# Patient Record
Sex: Female | Born: 1950 | Race: Black or African American | Hispanic: No | Marital: Married | Smoking: Never smoker
Health system: Southern US, Community
[De-identification: ages and names within clinical notes are randomized; demographics above are authoritative.]

## PROBLEM LIST (undated history)

## (undated) DIAGNOSIS — K219 Gastro-esophageal reflux disease without esophagitis: Secondary | ICD-10-CM

## (undated) DIAGNOSIS — M1990 Unspecified osteoarthritis, unspecified site: Secondary | ICD-10-CM

## (undated) DIAGNOSIS — E119 Type 2 diabetes mellitus without complications: Secondary | ICD-10-CM

## (undated) DIAGNOSIS — M542 Cervicalgia: Secondary | ICD-10-CM

## (undated) DIAGNOSIS — I509 Heart failure, unspecified: Secondary | ICD-10-CM

## (undated) DIAGNOSIS — G43909 Migraine, unspecified, not intractable, without status migrainosus: Secondary | ICD-10-CM

## (undated) DIAGNOSIS — G8929 Other chronic pain: Secondary | ICD-10-CM

## (undated) DIAGNOSIS — J302 Other seasonal allergic rhinitis: Secondary | ICD-10-CM

## (undated) DIAGNOSIS — N183 Chronic kidney disease, stage 3 unspecified: Secondary | ICD-10-CM

## (undated) DIAGNOSIS — T148XXA Other injury of unspecified body region, initial encounter: Secondary | ICD-10-CM

## (undated) DIAGNOSIS — I10 Essential (primary) hypertension: Secondary | ICD-10-CM

## (undated) DIAGNOSIS — Z8614 Personal history of Methicillin resistant Staphylococcus aureus infection: Secondary | ICD-10-CM

## (undated) DIAGNOSIS — J189 Pneumonia, unspecified organism: Secondary | ICD-10-CM

## (undated) DIAGNOSIS — J45909 Unspecified asthma, uncomplicated: Secondary | ICD-10-CM

## (undated) DIAGNOSIS — D649 Anemia, unspecified: Secondary | ICD-10-CM

## (undated) DIAGNOSIS — I499 Cardiac arrhythmia, unspecified: Secondary | ICD-10-CM

## (undated) DIAGNOSIS — F419 Anxiety disorder, unspecified: Secondary | ICD-10-CM

## (undated) DIAGNOSIS — E785 Hyperlipidemia, unspecified: Secondary | ICD-10-CM

## (undated) DIAGNOSIS — R002 Palpitations: Secondary | ICD-10-CM

## (undated) DIAGNOSIS — I2721 Secondary pulmonary arterial hypertension: Secondary | ICD-10-CM

## (undated) DIAGNOSIS — Z9289 Personal history of other medical treatment: Secondary | ICD-10-CM

## (undated) DIAGNOSIS — F32A Depression, unspecified: Secondary | ICD-10-CM

## (undated) DIAGNOSIS — M549 Dorsalgia, unspecified: Secondary | ICD-10-CM

## (undated) HISTORY — PX: COLONOSCOPY: SHX174

## (undated) HISTORY — PX: KNEE ARTHROSCOPY: SHX127

## (undated) HISTORY — PX: HAND SURGERY: SHX662

## (undated) HISTORY — DX: Essential (primary) hypertension: I10

## (undated) HISTORY — DX: Personal history of Methicillin resistant Staphylococcus aureus infection: Z86.14

## (undated) HISTORY — DX: Unspecified osteoarthritis, unspecified site: M19.90

## (undated) HISTORY — DX: Anemia, unspecified: D64.9

## (undated) HISTORY — DX: Hyperlipidemia, unspecified: E78.5

## (undated) HISTORY — PX: DILATION AND CURETTAGE OF UTERUS: SHX78

## (undated) HISTORY — PX: POLYPECTOMY: SHX149

## (undated) HISTORY — PX: COLONOSCOPY W/ POLYPECTOMY: SHX1380

## (undated) HISTORY — DX: Unspecified asthma, uncomplicated: J45.909

## (undated) HISTORY — DX: Palpitations: R00.2

## (undated) HISTORY — DX: Dorsalgia, unspecified: M54.9

## (undated) HISTORY — PX: NASAL SINUS SURGERY: SHX719

## (undated) HISTORY — PX: JOINT REPLACEMENT: SHX530

## (undated) HISTORY — PX: ESOPHAGOGASTRODUODENOSCOPY: SHX1529

## (undated) HISTORY — PX: BACK SURGERY: SHX140

## (undated) HISTORY — PX: UPPER GASTROINTESTINAL ENDOSCOPY: SHX188

---

## 1998-01-21 ENCOUNTER — Encounter: Admission: RE | Admit: 1998-01-21 | Discharge: 1998-04-21 | Payer: Self-pay | Admitting: Endocrinology

## 2001-03-21 ENCOUNTER — Other Ambulatory Visit: Admission: RE | Admit: 2001-03-21 | Discharge: 2001-03-21 | Payer: Self-pay | Admitting: *Deleted

## 2001-03-28 HISTORY — PX: ABDOMINAL HYSTERECTOMY: SHX81

## 2001-04-19 ENCOUNTER — Encounter (INDEPENDENT_AMBULATORY_CARE_PROVIDER_SITE_OTHER): Payer: Self-pay | Admitting: Specialist

## 2001-04-20 ENCOUNTER — Inpatient Hospital Stay (HOSPITAL_COMMUNITY): Admission: EM | Admit: 2001-04-20 | Discharge: 2001-04-21 | Payer: Self-pay | Admitting: *Deleted

## 2005-10-30 ENCOUNTER — Encounter: Admission: RE | Admit: 2005-10-30 | Discharge: 2005-10-30 | Payer: Self-pay | Admitting: Orthopaedic Surgery

## 2005-12-06 ENCOUNTER — Encounter: Admission: RE | Admit: 2005-12-06 | Discharge: 2005-12-06 | Payer: Self-pay | Admitting: Orthopaedic Surgery

## 2005-12-27 ENCOUNTER — Encounter: Admission: RE | Admit: 2005-12-27 | Discharge: 2005-12-27 | Payer: Self-pay | Admitting: Orthopaedic Surgery

## 2006-01-17 ENCOUNTER — Encounter: Admission: RE | Admit: 2006-01-17 | Discharge: 2006-01-17 | Payer: Self-pay | Admitting: Orthopaedic Surgery

## 2006-05-17 ENCOUNTER — Ambulatory Visit: Payer: Self-pay

## 2006-12-21 ENCOUNTER — Encounter: Admission: RE | Admit: 2006-12-21 | Discharge: 2006-12-21 | Payer: Self-pay | Admitting: Neurological Surgery

## 2008-04-02 ENCOUNTER — Encounter: Admission: RE | Admit: 2008-04-02 | Discharge: 2008-04-02 | Payer: Self-pay | Admitting: Cardiology

## 2010-05-08 ENCOUNTER — Ambulatory Visit: Payer: Self-pay | Admitting: Cardiology

## 2010-05-11 ENCOUNTER — Encounter: Admission: RE | Admit: 2010-05-11 | Discharge: 2010-05-11 | Payer: Self-pay | Admitting: Endocrinology

## 2010-06-28 HISTORY — PX: CATARACT EXTRACTION W/ INTRAOCULAR LENS  IMPLANT, BILATERAL: SHX1307

## 2010-09-07 ENCOUNTER — Ambulatory Visit (INDEPENDENT_AMBULATORY_CARE_PROVIDER_SITE_OTHER): Payer: Medicare Other | Admitting: Cardiology

## 2010-09-07 DIAGNOSIS — E789 Disorder of lipoprotein metabolism, unspecified: Secondary | ICD-10-CM

## 2010-09-07 DIAGNOSIS — I119 Hypertensive heart disease without heart failure: Secondary | ICD-10-CM

## 2010-09-07 DIAGNOSIS — E119 Type 2 diabetes mellitus without complications: Secondary | ICD-10-CM

## 2010-11-13 NOTE — Op Note (Signed)
[facility]  Patient:    Belinda [name], Belinda [name] Visit Number: [id number] MRN: [id number]          Service Type: SUR Location: 4W [id number] Attending Physician:  [name] Proc. Date: 04/19/01 Admit Date:  04/19/2001   CC:         Belinda [name], M.D.                           Operative Report  PREOPERATIVE DIAGNOSES: 1. Leiomyomata uteri. 2. Abnormal uterine bleeding. 3. Anemia secondary to above.  POSTOPERATIVE DIAGNOSES: 1. Leiomyomata uteri. 2. Abnormal uterine bleeding. 3. Anemia secondary to above. 4. Left ovarian cyst.  OPERATION:  Abdominal supracervical hysterectomy, left salpingo-oophorectomy.  ANESTHESIA:  General orotracheal.  OPERATOR:  [name]. [name], M.D.  FIRST ASSISTANT:  Belinda [name], M.D.  INDICATION FOR SURGERY:  The patient is a 60 year old with markedly enlarged uterus and abnormal bleeding for hysterectomy and indicated procedures.  She is fully counseled as to the nature of the procedure and the risks involved to include risks of anesthesia; injury to bowel, bladder, blood vessels, ureters, postoperative hemorrhage, infection, recuperation, and possible use of hormone replacement should her ovaries be removed.  She fully understands all of these considerations and wishes to proceed on April 19, 2001.  OPERATIVE FINDINGS:  On entry into the abdomen, the uterus was approximately [redacted] weeks gestational size.  The left ovary had a large cyst approximately 5 cm in greatest dimensions which appeared to be simple.  The right ovary and tube appeared to be normal.  Exploration of the upper abdomen revealed all structures to be normal, to include the appendix.  DESCRIPTION OF PROCEDURE:  With the patient under general anesthesia, prepared and draped in the usual sterile fashion, with a Foley catheter in the bladder, a lower abdominal vertical incision was made through a previous surgical scar. Entry into the  peritoneum was accomplished without difficulty.  The uterine fundus was elevated out of the incision using traction.  Heaney clamps were used to clamp the uteroovarian anastomoses, tubes, and round ligaments bilaterally; these structures were cut free and doubly ligated with 1 chromic catgut.  The left ovarian cyst was excised initially.  Heaney clamps were used to clamp the round ligaments bilaterally which were then transected and suture ligated with 1 chromic catgut.  Bladder flap was developed on the anterior surface of the uterus; the bladder had been advanced high on the uterine fundus after a previous C-section.  The bladder was taken down over the lower uterine segment and cervix, and Heaney clamps were used after the vessels were skeletonized to clamp these structures which were then cut and suture ligated with 1 chromic catgut.  Heaney clamps were used to clamp the remaining portions of the uterine vessels and cardinal ligaments bilaterally; these structures were sequentially cut free and individually suture ligated with 1 chromic catgut.  It was then possible to excise the uterine fundus using Bovie electrocoagulation to remove the fundus from the cervix.  Small bleeders on the cervix were rendered hemostatic with Bovie electrocoagulation. Interrupted figure-of-eight sutures of 1 chromic catgut were used to reapproximate the edges of the cervix and for hemostasis.  Small bleeders were rendered hemostatic with Bovie electrocoagulation.  At this point, it was noted that there was a large amount of bleeding from the left infundibulopelvic ligament distal to the ovary so that the ovary could not be conserved.  Accordingly, a  Heaney clamp was placed across this ligament, and the left tube and ovary were excised; the infundibulopelvic ligament was rendered hemostatic with two ties of 1 chromic catgut.  The area was lavaged with copious amounts of Lactated Ringers solution, and after  noting that hemostasis was maintained, the right ovary was suspended on the right lateral peritoneal surface with interrupted suture of 1 chromic catgut.  Peritoneum was then closed after noting that hemostasis was maintained and that sponge and instruments counts were correct with a continuous suture of 0 Vicryl.  The fascia was closed with two sutures of 0 PDS which were brought from the upper and lower aspects of the incision and tied separately in the midline. Subcutaneous fat was reapproximated with interrupted sutures of 1 chromic catgut.  Skin was closed with a subcuticular suture of 3-0 plain catgut. Estimated blood loss 250 mL.  The patient was taken to the recovery room in good condition with clear urine and a Foley catheter tubing.  She will be placed on a 23-hour observation following surgery. Attending Physician:  [name] DD:  04/19/01 TD:  04/20/01 Job: [id number]

## 2010-12-04 ENCOUNTER — Other Ambulatory Visit: Payer: Self-pay | Admitting: Cardiology

## 2010-12-07 NOTE — Telephone Encounter (Signed)
Med refill

## 2011-03-04 ENCOUNTER — Other Ambulatory Visit: Payer: Self-pay | Admitting: Cardiology

## 2011-03-04 DIAGNOSIS — I10 Essential (primary) hypertension: Secondary | ICD-10-CM

## 2011-03-05 NOTE — Telephone Encounter (Signed)
Refilled microzide

## 2011-03-15 ENCOUNTER — Encounter: Payer: Self-pay | Admitting: *Deleted

## 2011-03-15 DIAGNOSIS — D649 Anemia, unspecified: Secondary | ICD-10-CM | POA: Insufficient documentation

## 2011-03-15 DIAGNOSIS — Z8614 Personal history of Methicillin resistant Staphylococcus aureus infection: Secondary | ICD-10-CM | POA: Insufficient documentation

## 2011-03-15 DIAGNOSIS — M549 Dorsalgia, unspecified: Secondary | ICD-10-CM | POA: Insufficient documentation

## 2011-03-15 DIAGNOSIS — M1990 Unspecified osteoarthritis, unspecified site: Secondary | ICD-10-CM | POA: Insufficient documentation

## 2011-03-15 DIAGNOSIS — R002 Palpitations: Secondary | ICD-10-CM | POA: Insufficient documentation

## 2011-03-22 ENCOUNTER — Ambulatory Visit (INDEPENDENT_AMBULATORY_CARE_PROVIDER_SITE_OTHER): Payer: Medicare Other | Admitting: Cardiology

## 2011-03-22 ENCOUNTER — Other Ambulatory Visit: Payer: Self-pay | Admitting: *Deleted

## 2011-03-22 ENCOUNTER — Encounter: Payer: Self-pay | Admitting: Cardiology

## 2011-03-22 VITALS — BP 130/70 | HR 98 | Wt 178.0 lb

## 2011-03-22 DIAGNOSIS — E119 Type 2 diabetes mellitus without complications: Secondary | ICD-10-CM

## 2011-03-22 DIAGNOSIS — R0609 Other forms of dyspnea: Secondary | ICD-10-CM

## 2011-03-22 DIAGNOSIS — M549 Dorsalgia, unspecified: Secondary | ICD-10-CM

## 2011-03-22 DIAGNOSIS — R002 Palpitations: Secondary | ICD-10-CM

## 2011-03-22 DIAGNOSIS — R0600 Dyspnea, unspecified: Secondary | ICD-10-CM

## 2011-03-22 DIAGNOSIS — M1990 Unspecified osteoarthritis, unspecified site: Secondary | ICD-10-CM

## 2011-03-22 DIAGNOSIS — I119 Hypertensive heart disease without heart failure: Secondary | ICD-10-CM

## 2011-03-22 DIAGNOSIS — R0989 Other specified symptoms and signs involving the circulatory and respiratory systems: Secondary | ICD-10-CM

## 2011-03-22 DIAGNOSIS — R079 Chest pain, unspecified: Secondary | ICD-10-CM

## 2011-03-22 MED ORDER — NITROGLYCERIN 0.4 MG SL SUBL: 0.4000 mg | SUBLINGUAL_TABLET | SUBLINGUAL | Status: DC | PRN

## 2011-03-22 MED ORDER — ALPRAZOLAM 0.25 MG PO TABS: 0.2500 mg | ORAL_TABLET | Freq: Every evening | ORAL | Status: DC | PRN

## 2011-03-22 NOTE — Assessment & Plan Note (Signed)
The patient has occasional chest pains for she does not have any history of known ischemic heart disease.  She had a normal nuclear stress test using adenosine on 10/26/07 with no ischemia or scar and normal LV systolic function and an ejection fraction of 74%.  Her chest discomfort is occasional and does not appear to be progressive and no further studies are planned at this time.

## 2011-03-22 NOTE — Assessment & Plan Note (Signed)
Lots of recent palpitations day and night.  Off caffeine.

## 2011-03-22 NOTE — Telephone Encounter (Signed)
Refilled alprazolam and ntg

## 2011-03-22 NOTE — Progress Notes (Signed)
Belinda [name] Date of Birth:  [birth year] [facility] [id number]   [place], [place]  [address] [phone]           Fax   [phone]  History of Present Illness: This pleasant 60 year old African American woman is seen for a scheduled 4 month followup office visit is a history of essential hypertension, hypercholesterolemia, insulin-dependent diabetes mellitus, and palpitations.  She also has inoperable back problems for which she is on medical disability.  Her diabetes is followed closely by Dr. Forde Dandy.  Since last visit she's had no new cardiac symptoms.  She notes occasional chest pain relieved by sublingual nitroglycerin and we did call her in some new nitroglycerin tablets today.  Current Outpatient Prescriptions  Medication Sig Dispense Refill  . aspirin EC 81 MG tablet Take 81 mg by mouth every other day.        [name] azelastine (ASTELIN) 137 MCG/SPRAY nasal spray Place 1 spray into the nose 2 (two) times daily. Use in each nostril as directed       . ezetimibe-simvastatin (VYTORIN) 10-20 MG per tablet Take by mouth at bedtime. Taking 10/40 daily      . fexofenadine (ALLEGRA) 60 MG tablet Take 60 mg by mouth daily. As needed      . GuaiFENesin (MUCINEX PO) Take by mouth. As needed       . insulin regular human CONCENTRATED (HUMULIN R) 500 UNIT/ML SOLN injection Inject into the skin. 12units am 10 noon 8 pm       . losartan-hydrochlorothiazide (HYZAAR) 50-12.5 MG per tablet 2 (two) times daily.       [name] MICROZIDE 12.5 MG capsule TAKE 2 CAPSULES EVERY DAY  60 capsule  11  . potassium chloride (KLOR-CON) 10 MEQ CR tablet Take 10 mEq by mouth daily. Take two twice a day       . pregabalin (LYRICA) 75 MG capsule Take 75 mg by mouth 2 (two) times daily.        [name] torsemide (DEMADEX) 20 MG tablet Take 20 mg by mouth daily. As needed       . ALPRAZolam (XANAX) 0.25 MG tablet Take 1 tablet (0.25 mg total) by mouth at bedtime as needed.  30 tablet  5  .  nitroGLYCERIN (NITROSTAT) 0.4 MG SL tablet Place 1 tablet (0.4 mg total) under the tongue every 5 (five) minutes as needed.  90 tablet  11    Allergies  Allergen Reactions  . Augmentin     GI issues  . Omnicef     Patient Active Problem List  Diagnoses  . Diabetes mellitus  . Back pain  . Palpitations  . Anemia  . Hx MRSA infection  . Osteoarthritis  . Dyspnea on exertion    History  Smoking status  . Never Smoker   Smokeless tobacco  . Not on file    History  Alcohol Use     No family history on file.  Review of Systems: Constitutional: no fever chills diaphoresis or fatigue or change in weight.  Head and neck: no hearing loss, no epistaxis, no photophobia or visual disturbance. Respiratory: No cough, shortness of breath or wheezing. Cardiovascular: No chest pain peripheral edema, palpitations. Gastrointestinal: No abdominal distention, no abdominal pain, no change in bowel habits hematochezia or melena. Genitourinary: No dysuria, no frequency, no urgency, no nocturia. Musculoskeletal:No arthralgias, no back pain, no gait disturbance or myalgias. Neurological: No dizziness, no headaches, no numbness, no seizures, no  syncope, no weakness, no tremors. Hematologic: No lymphadenopathy, no easy bruising. Psychiatric: No confusion, no hallucinations, no sleep disturbance.    Physical Exam: Filed Vitals:   03/22/11 1611  BP: 130/70  Pulse: 98  The general appearance reveals a well-developed well-nourished woman in no distress.Pupils equal and reactive.   Extraocular Movements are full.  There is no scleral icterus.  The mouth and pharynx are normal.  The neck is supple.  The carotids reveal no bruits.  The jugular venous pressure is normal.  The thyroid is not enlarged.  There is no lymphadenopathy.  The chest is clear to percussion and auscultation. There are no rales or rhonchi. Expansion of the chest is symmetrical.  The precordium is quiet.  The first heart  sound is normal.  The second heart sound is physiologically split.  There is no murmur gallop rub or click.  There is no abnormal lift or heave.  The abdomen is soft and nontender. Bowel sounds are normal. The liver and spleen are not enlarged. There Are no abdominal masses. There are no bruits.  The pedal pulses are good.  There is no phlebitis or edema.  There is no cyanosis or clubbing.  Strength is normal and symmetrical in all extremities.  There is no lateralizing weakness.  There are no sensory deficits.  The skin is warm and dry.  There is no rash.    Assessment / Plan: Continue same medication.  Recheck in 4 months for followup office visit And we will plan to do an EKG next time

## 2011-03-22 NOTE — Assessment & Plan Note (Signed)
Back pain interferes with sleep.

## 2011-03-22 NOTE — Assessment & Plan Note (Signed)
Sleeps on one pillow. No nocturia.

## 2011-04-06 ENCOUNTER — Other Ambulatory Visit: Payer: Self-pay | Admitting: Cardiology

## 2011-09-06 ENCOUNTER — Other Ambulatory Visit: Payer: Self-pay | Admitting: Cardiology

## 2011-09-06 NOTE — Telephone Encounter (Signed)
Refilled potassium

## 2011-12-13 ENCOUNTER — Other Ambulatory Visit: Payer: Self-pay | Admitting: Cardiology

## 2012-01-14 ENCOUNTER — Other Ambulatory Visit: Payer: Self-pay | Admitting: Cardiology

## 2012-01-17 NOTE — Telephone Encounter (Signed)
Refilled potassium, patient needs appointment

## 2012-02-11 ENCOUNTER — Other Ambulatory Visit: Payer: Self-pay | Admitting: Cardiology

## 2012-03-23 ENCOUNTER — Other Ambulatory Visit: Payer: Self-pay | Admitting: Cardiology

## 2012-05-05 ENCOUNTER — Other Ambulatory Visit: Payer: Self-pay

## 2012-05-05 MED ORDER — POTASSIUM CHLORIDE ER 10 MEQ PO TBCR: 10.0000 meq | EXTENDED_RELEASE_TABLET | ORAL | Status: DC

## 2012-05-09 ENCOUNTER — Other Ambulatory Visit: Payer: Self-pay | Admitting: *Deleted

## 2012-05-15 ENCOUNTER — Ambulatory Visit (INDEPENDENT_AMBULATORY_CARE_PROVIDER_SITE_OTHER): Payer: Medicare Other | Admitting: Cardiology

## 2012-05-15 ENCOUNTER — Encounter: Payer: Self-pay | Admitting: Cardiology

## 2012-05-15 VITALS — BP 128/78 | HR 101 | Ht 61.0 in | Wt 168.0 lb

## 2012-05-15 DIAGNOSIS — E785 Hyperlipidemia, unspecified: Secondary | ICD-10-CM

## 2012-05-15 DIAGNOSIS — R002 Palpitations: Secondary | ICD-10-CM

## 2012-05-15 DIAGNOSIS — M549 Dorsalgia, unspecified: Secondary | ICD-10-CM

## 2012-05-15 DIAGNOSIS — I119 Hypertensive heart disease without heart failure: Secondary | ICD-10-CM

## 2012-05-15 NOTE — Assessment & Plan Note (Signed)
She has had to take no recent sublingual nitroglycerin.  No recent severe chest pain.  When she does get chest pain it is usually brought on by emotional stress rather than physical exertion

## 2012-05-15 NOTE — Assessment & Plan Note (Signed)
The patient continues to have to take hydrocodone for her chronic severe back pain which she is disabled

## 2012-05-15 NOTE — Patient Instructions (Signed)
Your physician recommends that you continue on your current medications as directed. Please refer to the Current Medication list given to you today.  Your physician wants you to follow-up in: 6 months. You will receive a reminder letter in the mail two months in advance. If you don't receive a letter, please call our office to schedule the follow-up appointment.  

## 2012-05-15 NOTE — Assessment & Plan Note (Signed)
The patient has not been aware of any recent palpitations

## 2012-05-15 NOTE — Progress Notes (Signed)
Belinda [name] Date of Birth:  09-Sep-1950 [facility] [address] [place], [place]  [address] [phone]         Fax   [phone]  History of Present Illness: This pleasant 61 year old African American woman is seen for a scheduled 4 month followup office visit is a history of essential hypertension, hypercholesterolemia, insulin-dependent diabetes mellitus, and palpitations. She also has inoperable back problems for which she is on medical disability. Her diabetes is followed closely by Dr. Forde Dandy. Since last visit she's had no new cardiac symptoms. She notes occasional chest pain relieved by sublingual nitroglycerin.  She has lost 10 pounds since last visit.  He is on a new diabetes pill prescribed by her endocrinologist Dr. Forde Dandy .  She takes Invokana 150 mg daily and its mechanism of action is to cause an increased urine output of glucose from the body.  She attributes her 10 pound weight loss to this effect.   Current Outpatient Prescriptions  Medication Sig Dispense Refill  . ALPRAZolam (XANAX) 0.25 MG tablet Take 1 tablet (0.25 mg total) by mouth at bedtime as needed.  30 tablet  5  . aspirin EC 81 MG tablet Take 81 mg by mouth every other day.        [name] azelastine (ASTELIN) 137 MCG/SPRAY nasal spray Place 1 spray into the nose 2 (two) times daily. Use in each nostril as directed       . Canagliflozin (INVOKANA) 300 MG TABS Take by mouth as directed. 1/2 tablet daily      . ezetimibe-simvastatin (VYTORIN) 10-20 MG per tablet Take by mouth at bedtime. Taking 10/40 daily      . fexofenadine (ALLEGRA) 60 MG tablet Take 60 mg by mouth daily. As needed      . GuaiFENesin (MUCINEX PO) Take by mouth. As needed       . HYDROcodone-acetaminophen (NORCO/VICODIN) 5-325 MG per tablet Take 1 tablet by mouth every 6 (six) hours as needed.      . insulin regular human CONCENTRATED (HUMULIN R) 500 UNIT/ML SOLN injection Inject into the skin. 12units am 10 noon 8 pm       .  losartan-hydrochlorothiazide (HYZAAR) 50-12.5 MG per tablet TAKE 1 OR 2 TABLETS EVERY DAY  68 tablet  2  . MICROZIDE 12.5 MG capsule TAKE 2 CAPSULES EVERY DAY  60 capsule  11  . nitroGLYCERIN (NITROSTAT) 0.4 MG SL tablet Place 1 tablet (0.4 mg total) under the tongue every 5 (five) minutes as needed.  90 tablet  11  . potassium chloride (KLOR-CON 10) 10 MEQ tablet Take 1 tablet (10 mEq total) by mouth as directed.  120 tablet  0  . pregabalin (LYRICA) 75 MG capsule Take 75 mg by mouth 2 (two) times daily.        [name] torsemide (DEMADEX) 20 MG tablet TAKE AS DIRECTED  30 tablet  6  . [DISCONTINUED] losartan-hydrochlorothiazide (HYZAAR) 50-12.5 MG per tablet 2 (two) times daily.         Allergies  Allergen Reactions  . Augmentin (Amoxicillin-Pot Clavulanate)     GI issues  . Erythromycin   . Omni-Pac     Patient Active Problem List  Diagnosis  . Diabetes mellitus  . Back pain  . Palpitations  . Anemia  . Hx MRSA infection  . Osteoarthritis  . Dyspnea on exertion  . Chest pain    History  Smoking status  . Never Smoker   Smokeless tobacco  . Not on  file    History  Alcohol Use     No family history on file.  Review of Systems: Constitutional: no fever chills diaphoresis or fatigue or change in weight.  Head and neck: no hearing loss, no epistaxis, no photophobia or visual disturbance. Respiratory: No cough, shortness of breath or wheezing. Cardiovascular: No chest pain peripheral edema, palpitations. Gastrointestinal: No abdominal distention, no abdominal pain, no change in bowel habits hematochezia or melena. Genitourinary: No dysuria, no frequency, no urgency, no nocturia. Musculoskeletal:No arthralgias, no back pain, no gait disturbance or myalgias. Neurological: No dizziness, no headaches, no numbness, no seizures, no syncope, no weakness, no tremors. Hematologic: No lymphadenopathy, no easy bruising. Psychiatric: No confusion, no hallucinations, no sleep  disturbance.    Physical Exam: Filed Vitals:   05/15/12 1551  BP: 128/78  Pulse: 101   general appearance reveals a well-developed well-nourished woman in no distress.The head and neck exam reveals pupils equal and reactive.  Extraocular movements are full.  There is no scleral icterus.  The mouth and pharynx are normal.  The neck is supple.  The carotids reveal no bruits.  The jugular venous pressure is normal.  The  thyroid is not enlarged.  There is no lymphadenopathy.  The chest is clear to percussion and auscultation.  There are no rales or rhonchi.  Expansion of the chest is symmetrical.  The precordium is quiet.  The first heart sound is normal.  The second heart sound is physiologically split.  There is a soft systolic murmur at the left sternal edge.  No gallop.  No rub.  There is no abnormal lift or heave.  The abdomen is soft and nontender.  The bowel sounds are normal.  The liver and spleen are not enlarged.  There are no abdominal masses.  There are no abdominal bruits.  Extremities reveal good pedal pulses.  There is no phlebitis or edema.  There is no cyanosis or clubbing.  Strength is normal and symmetrical in all extremities.  There is no lateralizing weakness.  There are no sensory deficits.  The skin is warm and dry.  There is no rash.     Assessment / Plan: The patient is to continue same medication.  Recheck in 6 months for followup office visit and EKG.  She is under a lot of stress because her only son was resuscitated from a cardiac arrest but has brain damage and is now on a long-term ventilator in a hospital up in [place].

## 2012-05-29 ENCOUNTER — Other Ambulatory Visit: Payer: Self-pay | Admitting: Cardiology

## 2012-05-29 MED ORDER — LOSARTAN POTASSIUM-HCTZ 50-12.5 MG PO TABS: ORAL_TABLET | ORAL | Status: DC

## 2012-06-23 ENCOUNTER — Other Ambulatory Visit: Payer: Self-pay

## 2012-06-23 MED ORDER — POTASSIUM CHLORIDE ER 10 MEQ PO TBCR: 10.0000 meq | EXTENDED_RELEASE_TABLET | ORAL | Status: DC

## 2012-06-26 ENCOUNTER — Other Ambulatory Visit: Payer: Self-pay

## 2012-06-26 MED ORDER — TORSEMIDE 20 MG PO TABS: 20.0000 mg | ORAL_TABLET | Freq: Every day | ORAL | Status: DC

## 2012-07-27 ENCOUNTER — Other Ambulatory Visit: Payer: Self-pay

## 2012-07-27 MED ORDER — POTASSIUM CHLORIDE ER 10 MEQ PO TBCR: 10.0000 meq | EXTENDED_RELEASE_TABLET | ORAL | Status: DC

## 2012-11-30 ENCOUNTER — Other Ambulatory Visit: Payer: Self-pay | Admitting: *Deleted

## 2012-11-30 MED ORDER — POTASSIUM CHLORIDE ER 10 MEQ PO TBCR: 10.0000 meq | EXTENDED_RELEASE_TABLET | ORAL | Status: DC

## 2013-04-07 ENCOUNTER — Other Ambulatory Visit: Payer: Self-pay | Admitting: Cardiology

## 2013-04-09 ENCOUNTER — Other Ambulatory Visit: Payer: Self-pay | Admitting: Cardiology

## 2013-05-11 ENCOUNTER — Other Ambulatory Visit: Payer: Self-pay | Admitting: Cardiology

## 2013-05-20 ENCOUNTER — Other Ambulatory Visit: Payer: Self-pay | Admitting: Cardiology

## 2013-06-01 ENCOUNTER — Encounter: Payer: Self-pay | Admitting: Cardiology

## 2013-06-01 ENCOUNTER — Ambulatory Visit (INDEPENDENT_AMBULATORY_CARE_PROVIDER_SITE_OTHER): Payer: Medicare Other | Admitting: Cardiology

## 2013-06-01 VITALS — BP 140/76 | HR 100 | Ht 61.0 in | Wt 163.0 lb

## 2013-06-01 DIAGNOSIS — R0609 Other forms of dyspnea: Secondary | ICD-10-CM

## 2013-06-01 DIAGNOSIS — I119 Hypertensive heart disease without heart failure: Secondary | ICD-10-CM

## 2013-06-01 DIAGNOSIS — R002 Palpitations: Secondary | ICD-10-CM

## 2013-06-01 DIAGNOSIS — R0989 Other specified symptoms and signs involving the circulatory and respiratory systems: Secondary | ICD-10-CM

## 2013-06-01 DIAGNOSIS — R0600 Dyspnea, unspecified: Secondary | ICD-10-CM

## 2013-06-01 MED ORDER — POTASSIUM CHLORIDE ER 10 MEQ PO TBCR: EXTENDED_RELEASE_TABLET | ORAL | Status: DC

## 2013-06-01 MED ORDER — LOSARTAN POTASSIUM-HCTZ 50-12.5 MG PO TABS: ORAL_TABLET | ORAL | Status: DC

## 2013-06-01 MED ORDER — MICROZIDE 12.5 MG PO CAPS: ORAL_CAPSULE | ORAL | Status: DC

## 2013-06-01 NOTE — Assessment & Plan Note (Signed)
The patient continues to have mild exertional dyspnea.  Her dyspnea has improved since she has lost weight.  She has not been experiencing any orthopnea or paroxysmal nocturnal dyspnea.

## 2013-06-01 NOTE — Assessment & Plan Note (Signed)
The patient has a past history of frequent PVCs.  These have been less noticeable recently.  EKG today shows no arrhythmia.

## 2013-06-01 NOTE — Progress Notes (Signed)
Belinda [name] Date of Birth:  26-May-1951 [address] [place], [place]  [address] [phone]         Fax   [phone]  History of Present Illness: This pleasant 62 year old [place] American woman is seen for a scheduled 4 month followup office visit.  She has a  history of essential hypertension, hypercholesterolemia, insulin-dependent diabetes mellitus, and palpitations. She also has inoperable back problems for which she is on medical disability. Her diabetes is followed closely by Dr. Forde Dandy. Since last visit she's had no new cardiac symptoms. She notes occasional chest pain relieved by sublingual nitroglycerin.  She has lost 5 more pounds since last visit.  He is on a new diabetes pill prescribed by her endocrinologist Dr. Forde Dandy .  She takes Invokana 150 mg daily and its mechanism of action is to cause an increased urine output of glucose from the body.  She attributes her  weight loss to this effect.  She has a history of fluid retention and takes Demadex once or twice a week.  She has not had to take any recent sublingual nitroglycerin.  Current Outpatient Prescriptions  Medication Sig Dispense Refill  . acetaminophen (TYLENOL ARTHRITIS PAIN) 650 MG CR tablet Take 650 mg by mouth every 8 (eight) hours as needed for pain.      [name] ALPRAZolam (XANAX) 0.25 MG tablet Take 1 tablet (0.25 mg total) by mouth at bedtime as needed.  30 tablet  5  . aspirin EC 81 MG tablet Take 81 mg by mouth every other day.        [name] azelastine (ASTELIN) 137 MCG/SPRAY nasal spray Place 1 spray into the nose 2 (two) times daily. Use in each nostril as directed       . B-D INS SYR HALF-UNIT .3CC/31G 31G X 5/16" 0.3 ML MISC As directed      . Canagliflozin (INVOKANA) 300 MG TABS Take by mouth as directed. 1/2 tablet daily      . ezetimibe-simvastatin (VYTORIN) 10-40 MG per tablet Take 1 tablet by mouth daily.      . fexofenadine (ALLEGRA) 60 MG tablet Take 60 mg by mouth daily. As needed      .  GuaiFENesin (MUCINEX PO) Take by mouth. As needed       . HYDROcodone-acetaminophen (NORCO/VICODIN) 5-325 MG per tablet Take 1 tablet by mouth every 6 (six) hours as needed.      . Ibuprofen (ADVIL MIGRAINE PO) Take by mouth. As needed      . ibuprofen (ADVIL,MOTRIN) 100 MG chewable tablet Chew by mouth every 8 (eight) hours as needed.      . insulin regular human CONCENTRATED (HUMULIN R) 500 UNIT/ML SOLN injection Inject into the skin. 12units am 10 noon 8 pm       . losartan-hydrochlorothiazide (HYZAAR) 50-12.5 MG per tablet TAKE 1 OR 2 TABLETS EVERY DAY  68 tablet  11  . MICROZIDE 12.5 MG capsule TAKE 2 CAPSULES EVERY DAY  60 capsule  11  . NASONEX 50 MCG/ACT nasal spray As needed      . nitroGLYCERIN (NITROSTAT) 0.4 MG SL tablet Place 1 tablet (0.4 mg total) under the tongue every 5 (five) minutes as needed.  90 tablet  11  . omeprazole (PRILOSEC) 20 MG capsule As directed      . ONE TOUCH ULTRA TEST test strip As needed      . potassium chloride (KLOR-CON 10) 10 MEQ tablet take 2 tabs twice a day  6  tablet  11  . pregabalin (LYRICA) 75 MG capsule Take 75 mg by mouth 2 (two) times daily.        [name] torsemide (DEMADEX) 20 MG tablet Take 20 mg by mouth. Take as needed       No current facility-administered medications for this visit.    Allergies  Allergen Reactions  . Augmentin [Amoxicillin-Pot Clavulanate]     GI issues  . Erythromycin   . Omni-Pac     Patient Active Problem List   Diagnosis Date Noted  . Dyspnea on exertion 03/22/2011  . Chest pain 03/22/2011  . Diabetes mellitus   . Back pain   . Palpitations   . Anemia   . Hx MRSA infection   . Osteoarthritis     History  Smoking status  . Never Smoker   Smokeless tobacco  . Not on file    History  Alcohol Use     No family history on file.  Review of Systems: Constitutional: no fever chills diaphoresis or fatigue or change in weight.  Head and neck: no hearing loss, no epistaxis, no photophobia or visual  disturbance. Respiratory: No cough, shortness of breath or wheezing. Cardiovascular: No chest pain peripheral edema, palpitations. Gastrointestinal: No abdominal distention, no abdominal pain, no change in bowel habits hematochezia or melena. Genitourinary: No dysuria, no frequency, no urgency, no nocturia. Musculoskeletal:No arthralgias, no back pain, no gait disturbance or myalgias. Neurological: No dizziness, no headaches, no numbness, no seizures, no syncope, no weakness, no tremors. Hematologic: No lymphadenopathy, no easy bruising. Psychiatric: No confusion, no hallucinations, no sleep disturbance.    Physical Exam: Filed Vitals:   06/01/13 1513  BP: 140/76  Pulse: 100   general appearance reveals a well-developed well-nourished woman in no distress.The head and neck exam reveals pupils equal and reactive.  Extraocular movements are full.  There is no scleral icterus.  The mouth and pharynx are normal.  The neck is supple.  The carotids reveal no bruits.  The jugular venous pressure is normal.  The  thyroid is not enlarged.  There is no lymphadenopathy.  The chest is clear to percussion and auscultation.  There are no rales or rhonchi.  Expansion of the chest is symmetrical.  The precordium is quiet.  The first heart sound is normal.  The second heart sound is physiologically split.  There is a soft systolic murmur at the left sternal edge.  No gallop.  No rub.  There is no abnormal lift or heave.  The abdomen is soft and nontender.  The bowel sounds are normal.  The liver and spleen are not enlarged.  There are no abdominal masses.  There are no abdominal bruits.  Extremities reveal good pedal pulses.  There is no phlebitis or edema.  There is no cyanosis or clubbing.  Strength is normal and symmetrical in all extremities.  There is no lateralizing weakness.  There are no sensory deficits.  The skin is warm and dry.  There is no rash.  EKG shows normal sinus rhythm and nonspecific T-wave  flattening   Assessment / Plan: The patient is to continue same medication.  Recheck in 6 months for followup office visit and EKG.  She is under a lot of stress because her only son was resuscitated from a cardiac arrest but has brain damage and is now on a long-term ventilator in a hospital up in [place].  He is quadriplegic and on a ventilator.  Belinda [name] and her husband drive up  to see him once or twice a week.  It is a three-hour trip each way.

## 2013-06-01 NOTE — Assessment & Plan Note (Signed)
She has not been experiencing any recent chest discomfort.  Her electrocardiogram today shows no ischemic changes.

## 2013-06-01 NOTE — Patient Instructions (Signed)
Your physician recommends that you continue on your current medications as directed. Please refer to the Current Medication list given to you today.  Your physician wants you to follow-up in: 6 month ov You will receive a reminder letter in the mail two months in advance. If you don't receive a letter, please call our office to schedule the follow-up appointment.  

## 2013-06-04 ENCOUNTER — Other Ambulatory Visit: Payer: Self-pay | Admitting: *Deleted

## 2013-06-04 DIAGNOSIS — I119 Hypertensive heart disease without heart failure: Secondary | ICD-10-CM

## 2013-06-04 MED ORDER — POTASSIUM CHLORIDE ER 10 MEQ PO TBCR: EXTENDED_RELEASE_TABLET | ORAL | Status: DC

## 2013-06-12 ENCOUNTER — Encounter (INDEPENDENT_AMBULATORY_CARE_PROVIDER_SITE_OTHER): Payer: Self-pay | Admitting: Surgery

## 2013-06-12 ENCOUNTER — Ambulatory Visit (INDEPENDENT_AMBULATORY_CARE_PROVIDER_SITE_OTHER): Payer: Medicare Other | Admitting: Surgery

## 2013-06-12 VITALS — BP 134/78 | HR 100 | Temp 98.0°F | Resp 18 | Ht 61.5 in | Wt 156.0 lb

## 2013-06-12 DIAGNOSIS — L0231 Cutaneous abscess of buttock: Secondary | ICD-10-CM

## 2013-06-12 MED ORDER — HYDROCODONE-ACETAMINOPHEN 5-325 MG PO TABS: 1.0000 | ORAL_TABLET | Freq: Four times a day (QID) | ORAL | Status: DC | PRN

## 2013-06-12 NOTE — Progress Notes (Signed)
Patient ID: Belinda [name], female   DOB: 05/18/1951, 62 y.o.   MRN: [id number]  Chief Complaint  Patient presents with  . right buttock abscess    HPI Belinda [name] is a 62 y.o. female.  Referred by Dr. Reynold Bowen for evaluation of left gluteal abscess.  HPI This is a 62 year old female with diabetes who presents with a one-week history of an enlarging abscess over her left buttock that has now is beginning to spontaneously drain. It is draining some purulent foul-smelling fluid. The surrounding area is very tender and swollen. She was evaluated by the PA for Dr. Forde Dandy earlier today who determined that she had an abscess that needed further drainage. She is referred to urgent office for evaluation. Past Medical History  Diagnosis Date  . Hypertension   . Hyperlipidemia   . Diabetes mellitus     insulin dependent  . Back pain     intractable low back  . Palpitations   . Anemia     Hx. of  . Hx MRSA infection   . Osteoarthritis     Past Surgical History  Procedure Laterality Date  . Other surgical history      c section x 1  . Hand surgery      left hand  . Other surgical history      d&c x 2  . Nasal sinus surgery      No family history on file.  Social History History  Substance Use Topics  . Smoking status: Never Smoker   . Smokeless tobacco: Not on file  . Alcohol Use: No    Allergies  Allergen Reactions  . Augmentin [Amoxicillin-Pot Clavulanate]     GI issues  . Erythromycin   . Omni-Pac     Current Outpatient Prescriptions  Medication Sig Dispense Refill  . acetaminophen (TYLENOL ARTHRITIS PAIN) 650 MG CR tablet Take 650 mg by mouth every 8 (eight) hours as needed for pain.      [name] ALPRAZolam (XANAX) 0.25 MG tablet Take 1 tablet (0.25 mg total) by mouth at bedtime as needed.  30 tablet  5  . aspirin EC 81 MG tablet Take 81 mg by mouth every other day.        [name] azelastine (ASTELIN) 137 MCG/SPRAY nasal spray Place 1 spray into the nose 2 (two)  times daily. Use in each nostril as directed       . B-D INS SYR HALF-UNIT .3CC/31G 31G X 5/16" 0.3 ML MISC As directed      . Canagliflozin (INVOKANA) 300 MG TABS Take by mouth as directed. 1/2 tablet daily      . ezetimibe-simvastatin (VYTORIN) 10-40 MG per tablet Take 1 tablet by mouth daily.      . fexofenadine (ALLEGRA) 60 MG tablet Take 60 mg by mouth daily. As needed      . GuaiFENesin (MUCINEX PO) Take by mouth. As needed       . HYDROcodone-acetaminophen (NORCO/VICODIN) 5-325 MG per tablet Take 1 tablet by mouth every 6 (six) hours as needed.  30 tablet  0  . Ibuprofen (ADVIL MIGRAINE PO) Take by mouth. As needed      . ibuprofen (ADVIL,MOTRIN) 100 MG chewable tablet Chew by mouth every 8 (eight) hours as needed.      . insulin regular human CONCENTRATED (HUMULIN R) 500 UNIT/ML SOLN injection Inject into the skin. 12units am 10 noon 8 pm       . losartan-hydrochlorothiazide (HYZAAR) 50-12.5 MG per tablet  TAKE 1 OR 2 TABLETS EVERY DAY  68 tablet  11  . MICROZIDE 12.5 MG capsule TAKE 2 CAPSULES EVERY DAY  60 capsule  11  . NASONEX 50 MCG/ACT nasal spray As needed      . nitroGLYCERIN (NITROSTAT) 0.4 MG SL tablet Place 1 tablet (0.4 mg total) under the tongue every 5 (five) minutes as needed.  90 tablet  11  . omeprazole (PRILOSEC) 20 MG capsule As directed      . ONE TOUCH ULTRA TEST test strip As needed      . potassium chloride (KLOR-CON 10) 10 MEQ tablet take 2 tabs twice a day  120 tablet  2  . pregabalin (LYRICA) 75 MG capsule Take 75 mg by mouth 2 (two) times daily.        [name] torsemide (DEMADEX) 20 MG tablet Take 20 mg by mouth. Take as needed       No current facility-administered medications for this visit.    Review of Systems Review of Systems  Blood pressure 134/78, pulse 100, temperature 98 F (36.7 C), resp. rate 18, height 5' 1.5" (1.562 m), weight 156 lb (70.761 kg).  Physical Exam Physical Exam Obese in NAD The left buttock shows a 4 cm area of induration and  erythema with a central opening that is draining large amounts of purulent fluid. We prepped the area with Betadine and anesthetized with 1% lidocaine. I made a 2 cm round incision into this area  . Large deep abscess cavity. The wound is packed with half-inch Nu Gauze. The patient will return in a couple of days for further evaluation and dressing change. Data Reviewed None  Assessment    Left buttock abscess     Plan    We prepped the area with Betadine and anesthetized with 1% lidocaine. I made a 2 cm round incision into this area.  We entered a large deep abscess cavity. The wound is packed with half-inch Nu Gauze. The patient will return in a couple of days for further evaluation and dressing change.        Courage Biglow K. 06/12/2013, 5:05 PM

## 2013-06-13 ENCOUNTER — Encounter (INDEPENDENT_AMBULATORY_CARE_PROVIDER_SITE_OTHER): Payer: Self-pay

## 2013-06-14 ENCOUNTER — Ambulatory Visit (INDEPENDENT_AMBULATORY_CARE_PROVIDER_SITE_OTHER): Payer: Medicare Other | Admitting: Surgery

## 2013-06-14 ENCOUNTER — Encounter (INDEPENDENT_AMBULATORY_CARE_PROVIDER_SITE_OTHER): Payer: Self-pay | Admitting: Surgery

## 2013-06-14 VITALS — BP 122/74 | HR 86 | Temp 98.6°F | Resp 16 | Ht 61.75 in | Wt 158.6 lb

## 2013-06-14 DIAGNOSIS — Z5189 Encounter for other specified aftercare: Secondary | ICD-10-CM | POA: Insufficient documentation

## 2013-06-14 NOTE — Patient Instructions (Signed)
Remove packing in 48 hours.  Cover with maxipad.  Finish antibiotics.Return in 7- 10 days.

## 2013-06-14 NOTE — Progress Notes (Signed)
Patient returns after incision and drainage of right  Buttock abscess 2 days ago.  This Was done by Dr Georgette Dover.  She is having less drainage. She is currently taking doxycycline.  Exam: Right buttock packing removed. Minimal turbid fluid noted. Minimal erythema and mild induration. Wound repacked today.  Impression: 2 days status post incision and drainage right buttock abscess with history of DM II  Plan: Remove packing in 48 hours. Cover with maxi pad. Finish antibiotics. Return to clinic in 10-14 days for recheck.

## 2013-06-15 ENCOUNTER — Ambulatory Visit (INDEPENDENT_AMBULATORY_CARE_PROVIDER_SITE_OTHER): Payer: Medicare Other | Admitting: General Surgery

## 2013-06-15 ENCOUNTER — Encounter (INDEPENDENT_AMBULATORY_CARE_PROVIDER_SITE_OTHER): Payer: Self-pay | Admitting: General Surgery

## 2013-06-15 VITALS — BP 140/88 | HR 80 | Temp 98.1°F | Resp 16 | Ht 61.0 in | Wt 158.8 lb

## 2013-06-15 DIAGNOSIS — Z5189 Encounter for other specified aftercare: Secondary | ICD-10-CM

## 2013-06-15 NOTE — Patient Instructions (Signed)
Patient came in today for packing changing. I placed 1/4 packing strep in patient left buttock. I told patient that I will make her a nurse only visit on Monday for packing changing and check to wound. Patient has a follow up apt with Dr Harlow Asa on 06-22-13

## 2013-06-18 ENCOUNTER — Ambulatory Visit (INDEPENDENT_AMBULATORY_CARE_PROVIDER_SITE_OTHER): Payer: Medicare Other | Admitting: General Surgery

## 2013-06-18 ENCOUNTER — Encounter (INDEPENDENT_AMBULATORY_CARE_PROVIDER_SITE_OTHER): Payer: Self-pay | Admitting: General Surgery

## 2013-06-18 VITALS — BP 140/81 | HR 76 | Temp 98.6°F | Resp 14 | Ht 61.0 in | Wt 158.0 lb

## 2013-06-18 DIAGNOSIS — Z5189 Encounter for other specified aftercare: Secondary | ICD-10-CM

## 2013-06-18 NOTE — Patient Instructions (Signed)
Patient came in for a nurse only visit. I re-packed the buttock wound with 1/4 iodoform gauze and covered the wound with dry 4 x 4 gauze and placed tape over the gauze. I told her that if it falls out again to call the office and we will get her in to see a nurse for a nurse only visit. She does have a apt to see Dr Harlow Asa on 06-22-13 to reck buttock wound

## 2013-06-22 ENCOUNTER — Encounter (INDEPENDENT_AMBULATORY_CARE_PROVIDER_SITE_OTHER): Payer: Self-pay | Admitting: Surgery

## 2013-06-22 ENCOUNTER — Ambulatory Visit (INDEPENDENT_AMBULATORY_CARE_PROVIDER_SITE_OTHER): Payer: Medicare Other | Admitting: Surgery

## 2013-06-22 VITALS — BP 140/86 | HR 100 | Temp 98.9°F | Resp 14 | Ht 61.0 in | Wt 160.0 lb

## 2013-06-22 DIAGNOSIS — L0231 Cutaneous abscess of buttock: Secondary | ICD-10-CM

## 2013-06-22 NOTE — Patient Instructions (Signed)
Shower or tub bath once daily. Cleanse wound with soap and water.  Apply triple antibiotic ointment to the wound twice daily and cover with a dry bandage.  Earnstine Regal, MD, [facility] Office: [phone]

## 2013-06-22 NOTE — Progress Notes (Signed)
General Surgery [facility]  Chief Complaint  Patient presents with  . Follow-up    check buttock abscess - I&D 06/12/2013    HISTORY: Patient is a 62 year old female who underwent incision and drainage of a right buttock abscess on 06/12/2013. She was treated with 10 days of oral antibiotics which she has now completed. She returns today for wound check. Patient states that the dressing has not been changed in 4 days. However she also claims to be doing tub soaks.  EXAM: Examination shows a healing surgical wound on the lower right buttock. This measures less than 1 cm in diameter and less than 1 cm in depth. There is clean granulation tissue. There is no significant drainage. There is mild induration anteriorly. Wound is dressed with triple antibiotic ointment and a dry gauze bandage.  IMPRESSION: Right buttock abscess, healing by secondary intention  PLAN: Patient is reassured. I've asked her to begin cleansing the wound once daily either in the tub over the shower with soap and water. I've asked her to apply triple antibiotic ointment to the open wound.  Patient will return for surgical care as needed.  Earnstine Regal, MD, [facility]   Visit Diagnoses: 1. Cellulitis and abscess of buttock - right

## 2013-09-08 ENCOUNTER — Other Ambulatory Visit: Payer: Self-pay | Admitting: Cardiology

## 2013-09-21 ENCOUNTER — Other Ambulatory Visit: Payer: Self-pay | Admitting: Neurological Surgery

## 2013-09-21 DIAGNOSIS — M47812 Spondylosis without myelopathy or radiculopathy, cervical region: Secondary | ICD-10-CM

## 2013-09-21 DIAGNOSIS — M47816 Spondylosis without myelopathy or radiculopathy, lumbar region: Secondary | ICD-10-CM

## 2013-10-22 ENCOUNTER — Ambulatory Visit
Admission: RE | Admit: 2013-10-22 | Discharge: 2013-10-22 | Disposition: A | Discharge status: Home / Self Care | Payer: 59 | Source: Ambulatory Visit | Attending: Neurological Surgery | Admitting: Neurological Surgery

## 2013-10-22 ENCOUNTER — Ambulatory Visit
Admission: RE | Admit: 2013-10-22 | Discharge: 2013-10-22 | Disposition: A | Discharge status: Home / Self Care | Payer: Medicare Other | Source: Ambulatory Visit | Attending: Neurological Surgery | Admitting: Neurological Surgery

## 2013-10-22 DIAGNOSIS — M47812 Spondylosis without myelopathy or radiculopathy, cervical region: Secondary | ICD-10-CM

## 2013-10-22 DIAGNOSIS — M47816 Spondylosis without myelopathy or radiculopathy, lumbar region: Secondary | ICD-10-CM

## 2013-11-01 ENCOUNTER — Other Ambulatory Visit: Payer: Self-pay | Admitting: Neurological Surgery

## 2013-12-06 ENCOUNTER — Other Ambulatory Visit: Payer: Self-pay | Admitting: Cardiology

## 2013-12-14 ENCOUNTER — Encounter (HOSPITAL_COMMUNITY): Payer: Self-pay

## 2013-12-17 ENCOUNTER — Encounter (HOSPITAL_COMMUNITY)
Admission: RE | Admit: 2013-12-17 | Discharge: 2013-12-17 | Disposition: A | Discharge status: Home / Self Care | Payer: Medicare Other | Source: Ambulatory Visit | Attending: Anesthesiology | Admitting: Anesthesiology

## 2013-12-17 ENCOUNTER — Encounter (HOSPITAL_COMMUNITY): Payer: Self-pay

## 2013-12-17 ENCOUNTER — Encounter (HOSPITAL_COMMUNITY)
Admission: RE | Admit: 2013-12-17 | Discharge: 2013-12-17 | Disposition: A | Discharge status: Home / Self Care | Payer: Medicare Other | Source: Ambulatory Visit | Attending: Neurological Surgery | Admitting: Neurological Surgery

## 2013-12-17 DIAGNOSIS — Z01812 Encounter for preprocedural laboratory examination: Secondary | ICD-10-CM | POA: Insufficient documentation

## 2013-12-17 DIAGNOSIS — Z01818 Encounter for other preprocedural examination: Secondary | ICD-10-CM | POA: Insufficient documentation

## 2013-12-17 HISTORY — DX: Anxiety disorder, unspecified: F41.9

## 2013-12-17 HISTORY — DX: Gastro-esophageal reflux disease without esophagitis: K21.9

## 2013-12-17 HISTORY — DX: Cardiac arrhythmia, unspecified: I49.9

## 2013-12-17 HISTORY — DX: Migraine, unspecified, not intractable, without status migrainosus: G43.909

## 2013-12-17 LAB — BASIC METABOLIC PANEL
BUN: 31 mg/dL — ABNORMAL HIGH (ref 6–23)
CO2: 27 mEq/L (ref 19–32)
Calcium: 10 mg/dL (ref 8.4–10.5)
Chloride: 102 mEq/L (ref 96–112)
Creatinine, Ser: 0.98 mg/dL (ref 0.50–1.10)
GFR calc Af Amer: 70 mL/min — ABNORMAL LOW (ref >90)
GFR calc non Af Amer: 61 mL/min — ABNORMAL LOW (ref >90)
Glucose, Bld: 352 mg/dL — ABNORMAL HIGH (ref 70–99)
Potassium: 4.3 mEq/L (ref 3.7–5.3)
Sodium: 143 mEq/L (ref 137–147)

## 2013-12-17 LAB — SURGICAL PCR SCREEN
MRSA, PCR: NEGATIVE
Staphylococcus aureus: POSITIVE — AB

## 2013-12-17 LAB — CBC
HCT: 32.6 % — ABNORMAL LOW (ref 36.0–46.0)
Hemoglobin: 10.6 g/dL — ABNORMAL LOW (ref 12.0–15.0)
MCH: 25.4 pg — ABNORMAL LOW (ref 26.0–34.0)
MCHC: 32.5 g/dL (ref 30.0–36.0)
MCV: 78 fL (ref 78.0–100.0)
Platelets: 272 10*3/uL (ref 150–400)
RBC: 4.18 MIL/uL (ref 3.87–5.11)
RDW: 15.1 % (ref 11.5–15.5)
WBC: 5.9 10*3/uL (ref 4.0–10.5)

## 2013-12-17 NOTE — Pre-Procedure Instructions (Signed)
ELETA KOLBE  12/17/2013   Your procedure is scheduled on:  Monday, June 29th  Report to [place] at 0530 AM.  Call this number if you have problems the morning of surgery: [phone]   Remember:   Do not eat food or drink liquids after midnight.   Take these medicines the morning of surgery with A SIP OF WATER: xanax if needed, prilosec, hydrocodone if needed  No diabetes medication/insulin on morning of surgery  Stop taking OTC vitamins/herbal medications, aspirin, ibuprofen (advil, motrin) as of today.   Do not wear jewelry, make-up or nail polish.  Do not wear lotions, powders, or perfumes. You may wear deodorant.  Do not shave 48 hours prior to surgery. Men may shave face and neck.  Do not bring valuables to the hospital.  [facility] is not responsible for any belongings or valuables.               Contacts, dentures or bridgework may not be worn into surgery.  Leave suitcase in the car. After surgery it may be brought to your room.  For patients admitted to the hospital, discharge time is determined by your  treatment team.               Patients discharged the day of surgery will not be allowed to drive home.  Please read over the following fact sheets that you were given: Pain Booklet, Coughing and Deep Breathing, MRSA Information and Surgical Site Infection Prevention [place] - Preparing for Surgery  Before surgery, you can play an important role.  Because skin is not sterile, your skin needs to be as free of germs as possible.  You can reduce the number of germs on you skin by washing with CHG (chlorahexidine gluconate) soap before surgery.  CHG is an antiseptic cleaner which kills germs and bonds with the skin to continue killing germs even after washing.  Please DO NOT use if you have an allergy to CHG or antibacterial soaps.  If your skin becomes reddened/irritated stop using the CHG and inform your nurse when you arrive at Short  Stay.  Do not shave (including legs and underarms) for at least 48 hours prior to the first CHG shower.  You may shave your face.  Please follow these instructions carefully:   1.  Shower with CHG Soap the night before surgery and the morning of Surgery.  2.  If you choose to wash your hair, wash your hair first as usual with your normal shampoo.  3.  After you shampoo, rinse your hair and body thoroughly to remove the shampoo.  4.  Use CHG as you would any other liquid soap.  You can apply CHG directly to the skin and wash gently with scrungie or a clean washcloth.  5.  Apply the CHG Soap to your body ONLY FROM THE NECK DOWN.  Do not use on open wounds or open sores.  Avoid contact with your eyes, ears, mouth and genitals (private parts).  Wash genitals (private parts) with your normal soap.  6.  Wash thoroughly, paying special attention to the area where your surgery will be performed.  7.  Thoroughly rinse your body with warm water from the neck down.  8.  DO NOT shower/wash with your normal soap after using and rinsing off the CHG Soap.  9.  Pat yourself dry with a clean towel.            10.  Wear clean pajamas.            11.  Place clean sheets on your bed the night of your first shower and do not sleep with pets.  Day of Surgery  Do not apply any lotions/deoderants the morning of surgery.  Please wear clean clothes to the hospital/surgery center.

## 2013-12-17 NOTE — Progress Notes (Signed)
12/17/13 1419  OBSTRUCTIVE SLEEP APNEA  Have you ever been diagnosed with sleep apnea through a sleep study? No  Do you snore loudly (loud enough to be heard through closed doors)?  1  Do you often feel tired, fatigued, or sleepy during the daytime? 1  Has anyone observed you stop breathing during your sleep? 0  Do you have, or are you being treated for high blood pressure? 1  BMI more than 35 kg/m2? 0  Age over 63 years old? 1  Neck circumference greater than 40 cm/16 inches? 0  Gender: 0  Obstructive Sleep Apnea Score 4  Score 4 or greater  Results sent to PCP

## 2013-12-17 NOTE — Progress Notes (Signed)
Primary - dr. Roque Cash Cardiologist - dr. Mare Ferrari Dec 2014 ekg

## 2013-12-18 ENCOUNTER — Encounter (HOSPITAL_COMMUNITY): Payer: Self-pay | Admitting: Vascular Surgery

## 2013-12-18 NOTE — Progress Notes (Signed)
Anesthesia Chart Review:  Patient is a 63 year old female scheduled for C3-4, C5-6, C6-7 ACDF on 12/24/13 by Dr. Ellene Route.  History includes non-smoker, HTN, HLD, DM2 on insulin, palpitations, GERD, anxiety, migraines, anemia, osteoarthritis, MRSA infection, nasal sinus surgery, hysterectomy, cataract extraction. BMI is 30.46 consistent with mild obesity. OSA screening score was 4. PCP is Dr. Forde Dandy.  Of note, 05/2013 records state that she was under quite a bit of stress then due to her son suffering cardiac arrest with brain damage, quadriplegia, and was in a long-term ventilator hospital in [place].  Cardiologist is Dr. Mare Ferrari, last visit 06/01/13 for chest pain and palpitations with history of frequent PVCs follow-up.  She had not had any noticeable palpitations or any frequent chest pain at that visit. I called and spoke with patient.  She continues to deny chest pain (none for ~ 1 year).  She denies and persistent palpitations. No SOB at rest or dyspnea with minimal exertion.  Her activity is limited by severe back pain and knee pain.  She can do grocery shopping with support from the cart.  LE edema is controlled with her diuretic therapy.    EKG on 06/01/13 showed: NSR, septal infarct (age undetermined), non-specific T wave flattening.  According to Dr. Sherryl Barters 03/22/11 note, "She had a normal nuclear stress test using adenosine on 10/26/07 with no ischemia or scar and normal LV systolic function and an ejection fraction of 74%."  CXR on 12/17/13 showed: No active cardiopulmonary disease.  Preoperative labs show a BUN 31, Cr 0.98, non-fasting glucose 352.  H/H 10.6/32.6. She reports that she had eaten just prior to her PAT appointment, but she had taken her insulin earlier that day. She reports that her glucose readings fluctuate home. Fasting glucose levels tend to run below 100 - up to low 200's. She thinks her last A1c was around 10, but was down from 13. She has not had any recent steroid pills or  injections. Of note, patient does have concerns about hypoglycemia on the day of surgery.  Fortunately, she is a first case so she will not have a prolonged NPO status. I encouraged her to have a protein snack before bedtime. We discussed NPO status criteria.  She has the holding room number to call on the morning of surgery if needed to discuss hypo- or hyperglycemia issues. She does have clear liquid (apple juice) at home if she was symptomatically hypoglycemic, but otherwise she understands that she can be treated as necessary upon arrival.  I reviewed above with anesthesiologist Dr. Conrad Crownpoint.  Her fasting glucose will need to be reasonable to proceed.  Due to her history of DM2 with previous history of chest pain and stress test > 5 years ago, anesthesia feels she will also need to be cleared by Dr. Mare Ferrari.  I notified Jessica at Dr. Clarice Pole office of hyperglycemia and need for cardiac clearance.  She will contact Dr. Sherryl Barters office regarding clearance and let him know that patient denied any new CV symptoms since her last visit.  Proceeding will depend on cardiology recommendations and if her glucose is acceptable on the day of surgery.    George Hugh [facility] Short Stay Center/Anesthesiology Phone [phone] 12/18/2013 3:21 PM

## 2013-12-20 ENCOUNTER — Telehealth: Payer: Self-pay | Admitting: Cardiology

## 2013-12-20 NOTE — Telephone Encounter (Signed)
Left message will have to wait until  Dr. Mare Ferrari back next week per DOD

## 2013-12-20 NOTE — Telephone Encounter (Signed)
New problem    Lorriane Shire want to know if you received clearance forms on pt. Please advise

## 2013-12-20 NOTE — Progress Notes (Signed)
Message left for Belinda [name] re: follow up on cardiac clearance.

## 2013-12-24 ENCOUNTER — Inpatient Hospital Stay (HOSPITAL_COMMUNITY): Admission: RE | Admit: 2013-12-24 | Payer: Medicare Other | Source: Ambulatory Visit | Admitting: Neurological Surgery

## 2013-12-24 ENCOUNTER — Encounter (HOSPITAL_COMMUNITY): Admission: RE | Payer: Self-pay | Source: Ambulatory Visit

## 2013-12-24 SURGERY — ANTERIOR CERVICAL DECOMPRESSION/DISCECTOMY FUSION 3 LEVELS
Anesthesia: General

## 2013-12-25 ENCOUNTER — Telehealth: Payer: Self-pay | Admitting: Cardiology

## 2013-12-25 NOTE — Telephone Encounter (Signed)
Received request from Nurse fax box, documents faxed for surgical clearance. To: [facility] Fax number: [phone] Attention: 6.30.15/km

## 2013-12-26 ENCOUNTER — Other Ambulatory Visit: Payer: Self-pay | Admitting: Neurological Surgery

## 2013-12-26 NOTE — Progress Notes (Signed)
Anesthesia Update:  See my note from 12/18/13.  Surgery was postponed while awaiting cardiac clearance.  Dr. Mare Ferrari did sign a note of cardiac clearance dated 12/24/13.  Surgery has now been rescheduled for 01/01/14.   George Hugh [facility] Short Stay Center/Anesthesiology Phone [phone] 12/26/2013 6:21 PM

## 2013-12-31 MED ORDER — VANCOMYCIN HCL IN DEXTROSE 1-5 GM/200ML-% IV SOLN
1000.0000 mg | INTRAVENOUS | Status: AC
  Administered 2014-01-01: 1000 mg via INTRAVENOUS
  Filled 2013-12-31: qty 200

## 2013-12-31 NOTE — Progress Notes (Signed)
Belinda [name] reports that CBGs have been running in the 150's.

## 2014-01-01 ENCOUNTER — Inpatient Hospital Stay (HOSPITAL_COMMUNITY): Payer: Medicare Other

## 2014-01-01 ENCOUNTER — Encounter (HOSPITAL_COMMUNITY): Payer: Medicare Other | Admitting: Vascular Surgery

## 2014-01-01 ENCOUNTER — Encounter (HOSPITAL_COMMUNITY)
Admission: RE | Disposition: A | Discharge status: Home Health | Payer: Self-pay | Source: Ambulatory Visit | Attending: Neurological Surgery

## 2014-01-01 ENCOUNTER — Encounter (HOSPITAL_COMMUNITY): Payer: Self-pay | Admitting: Surgery

## 2014-01-01 ENCOUNTER — Inpatient Hospital Stay (HOSPITAL_COMMUNITY)
Admission: RE | Admit: 2014-01-01 | Discharge: 2014-01-03 | DRG: 473 | Disposition: A | Discharge status: Home Health | Payer: Medicare Other | Source: Ambulatory Visit | Attending: Neurological Surgery | Admitting: Neurological Surgery

## 2014-01-01 ENCOUNTER — Inpatient Hospital Stay (HOSPITAL_COMMUNITY): Payer: Medicare Other | Admitting: Vascular Surgery

## 2014-01-01 DIAGNOSIS — Z8614 Personal history of Methicillin resistant Staphylococcus aureus infection: Secondary | ICD-10-CM

## 2014-01-01 DIAGNOSIS — E785 Hyperlipidemia, unspecified: Secondary | ICD-10-CM | POA: Diagnosis present

## 2014-01-01 DIAGNOSIS — F411 Generalized anxiety disorder: Secondary | ICD-10-CM | POA: Diagnosis present

## 2014-01-01 DIAGNOSIS — K219 Gastro-esophageal reflux disease without esophagitis: Secondary | ICD-10-CM | POA: Diagnosis present

## 2014-01-01 DIAGNOSIS — M4712 Other spondylosis with myelopathy, cervical region: Principal | ICD-10-CM | POA: Diagnosis present

## 2014-01-01 DIAGNOSIS — Z886 Allergy status to analgesic agent status: Secondary | ICD-10-CM

## 2014-01-01 DIAGNOSIS — Z881 Allergy status to other antibiotic agents status: Secondary | ICD-10-CM

## 2014-01-01 DIAGNOSIS — E119 Type 2 diabetes mellitus without complications: Secondary | ICD-10-CM | POA: Diagnosis present

## 2014-01-01 DIAGNOSIS — Z888 Allergy status to other drugs, medicaments and biological substances status: Secondary | ICD-10-CM

## 2014-01-01 DIAGNOSIS — Z794 Long term (current) use of insulin: Secondary | ICD-10-CM

## 2014-01-01 DIAGNOSIS — I10 Essential (primary) hypertension: Secondary | ICD-10-CM | POA: Diagnosis present

## 2014-01-01 DIAGNOSIS — M4722 Other spondylosis with radiculopathy, cervical region: Secondary | ICD-10-CM

## 2014-01-01 HISTORY — PX: ANTERIOR CERVICAL DECOMP/DISCECTOMY FUSION: SHX1161

## 2014-01-01 LAB — GLUCOSE, CAPILLARY
Glucose-Capillary: 227 mg/dL — ABNORMAL HIGH (ref 70–99)
Glucose-Capillary: 192 mg/dL — ABNORMAL HIGH (ref 70–99)
Glucose-Capillary: 252 mg/dL — ABNORMAL HIGH (ref 70–99)
Glucose-Capillary: 363 mg/dL — ABNORMAL HIGH (ref 70–99)

## 2014-01-01 LAB — BASIC METABOLIC PANEL
Anion gap: 18 — ABNORMAL HIGH (ref 5–15)
BUN: 33 mg/dL — ABNORMAL HIGH (ref 6–23)
CO2: 27 mEq/L (ref 19–32)
Calcium: 10 mg/dL (ref 8.4–10.5)
Chloride: 96 mEq/L (ref 96–112)
Creatinine, Ser: 1.06 mg/dL (ref 0.50–1.10)
GFR calc Af Amer: 64 mL/min — ABNORMAL LOW (ref >90)
GFR calc non Af Amer: 55 mL/min — ABNORMAL LOW (ref >90)
Glucose, Bld: 220 mg/dL — ABNORMAL HIGH (ref 70–99)
Potassium: 4 mEq/L (ref 3.7–5.3)
Sodium: 141 mEq/L (ref 137–147)

## 2014-01-01 LAB — CBC
HCT: 33.5 % — ABNORMAL LOW (ref 36.0–46.0)
Hemoglobin: 11 g/dL — ABNORMAL LOW (ref 12.0–15.0)
MCH: 25.6 pg — ABNORMAL LOW (ref 26.0–34.0)
MCHC: 32.8 g/dL (ref 30.0–36.0)
MCV: 77.9 fL — ABNORMAL LOW (ref 78.0–100.0)
Platelets: 256 10*3/uL (ref 150–400)
RBC: 4.3 MIL/uL (ref 3.87–5.11)
RDW: 14.5 % (ref 11.5–15.5)
WBC: 8.4 10*3/uL (ref 4.0–10.5)

## 2014-01-01 SURGERY — ANTERIOR CERVICAL DECOMPRESSION/DISCECTOMY FUSION 3 LEVELS
Anesthesia: General | Site: Spine Cervical

## 2014-01-01 MED ORDER — HYDROCHLOROTHIAZIDE 12.5 MG PO CAPS: 12.5000 mg | ORAL_CAPSULE | Freq: Every day | ORAL | Status: DC

## 2014-01-01 MED ORDER — POTASSIUM CHLORIDE CRYS ER 20 MEQ PO TBCR
20.0000 meq | EXTENDED_RELEASE_TABLET | Freq: Two times a day (BID) | ORAL | Status: DC
  Administered 2014-01-01 – 2014-01-02 (×2): 20 meq via ORAL
  Filled 2014-01-01 (×6): qty 1

## 2014-01-01 MED ORDER — KETOROLAC TROMETHAMINE 30 MG/ML IJ SOLN
INTRAMUSCULAR | Status: AC
  Filled 2014-01-01: qty 1

## 2014-01-01 MED ORDER — ROCURONIUM BROMIDE 50 MG/5ML IV SOLN
INTRAVENOUS | Status: AC
  Filled 2014-01-01: qty 1

## 2014-01-01 MED ORDER — HYDROCODONE-ACETAMINOPHEN 5-325 MG PO TABS: 1.0000 | ORAL_TABLET | Freq: Four times a day (QID) | ORAL | Status: DC | PRN

## 2014-01-01 MED ORDER — ONDANSETRON HCL 4 MG/2ML IJ SOLN
INTRAMUSCULAR | Status: AC
  Filled 2014-01-01: qty 2

## 2014-01-01 MED ORDER — LIDOCAINE HCL (CARDIAC) 20 MG/ML IV SOLN
INTRAVENOUS | Status: DC | PRN
  Administered 2014-01-01: 50 mg via INTRAVENOUS

## 2014-01-01 MED ORDER — GLYCOPYRROLATE 0.2 MG/ML IJ SOLN
INTRAMUSCULAR | Status: AC
  Filled 2014-01-01: qty 5

## 2014-01-01 MED ORDER — HYDROMORPHONE HCL PF 1 MG/ML IJ SOLN
INTRAMUSCULAR | Status: AC
  Filled 2014-01-01: qty 1

## 2014-01-01 MED ORDER — METHOCARBAMOL 1000 MG/10ML IJ SOLN
500.0000 mg | Freq: Four times a day (QID) | INTRAVENOUS | Status: DC | PRN
  Filled 2014-01-01: qty 5

## 2014-01-01 MED ORDER — PNEUMOCOCCAL VAC POLYVALENT 25 MCG/0.5ML IJ INJ
0.5000 mL | INJECTION | INTRAMUSCULAR | Status: AC
  Administered 2014-01-02: 0.5 mL via INTRAMUSCULAR
  Filled 2014-01-01: qty 0.5

## 2014-01-01 MED ORDER — PHENYLEPHRINE HCL 10 MG/ML IJ SOLN
10.0000 mg | INTRAVENOUS | Status: DC | PRN
  Administered 2014-01-01: 20 ug/min via INTRAVENOUS

## 2014-01-01 MED ORDER — 0.9 % SODIUM CHLORIDE (POUR BTL) OPTIME
TOPICAL | Status: DC | PRN
  Administered 2014-01-01: 1000 mL

## 2014-01-01 MED ORDER — PHENYLEPHRINE 40 MCG/ML (10ML) SYRINGE FOR IV PUSH (FOR BLOOD PRESSURE SUPPORT)
PREFILLED_SYRINGE | INTRAVENOUS | Status: AC
  Filled 2014-01-01: qty 10

## 2014-01-01 MED ORDER — NITROGLYCERIN 0.4 MG SL SUBL: 0.4000 mg | SUBLINGUAL_TABLET | SUBLINGUAL | Status: DC | PRN

## 2014-01-01 MED ORDER — OXYCODONE-ACETAMINOPHEN 5-325 MG PO TABS
1.0000 | ORAL_TABLET | ORAL | Status: DC | PRN
  Administered 2014-01-01 – 2014-01-03 (×7): 2 via ORAL
  Filled 2014-01-01 (×7): qty 2

## 2014-01-01 MED ORDER — DEXAMETHASONE SODIUM PHOSPHATE 10 MG/ML IJ SOLN
INTRAMUSCULAR | Status: AC
  Filled 2014-01-01: qty 1

## 2014-01-01 MED ORDER — LOSARTAN POTASSIUM 50 MG PO TABS
50.0000 mg | ORAL_TABLET | Freq: Every day | ORAL | Status: DC
  Filled 2014-01-01 (×2): qty 1

## 2014-01-01 MED ORDER — HYDROMORPHONE HCL PF 1 MG/ML IJ SOLN
0.2500 mg | INTRAMUSCULAR | Status: DC | PRN
  Administered 2014-01-01 (×2): 0.5 mg via INTRAVENOUS

## 2014-01-01 MED ORDER — SODIUM CHLORIDE 0.9 % IR SOLN
Status: DC | PRN
  Administered 2014-01-01: 12:00:00

## 2014-01-01 MED ORDER — ALUM & MAG HYDROXIDE-SIMETH 200-200-20 MG/5ML PO SUSP: 30.0000 mL | Freq: Four times a day (QID) | ORAL | Status: DC | PRN

## 2014-01-01 MED ORDER — FENTANYL CITRATE 0.05 MG/ML IJ SOLN
INTRAMUSCULAR | Status: AC
  Filled 2014-01-01: qty 5

## 2014-01-01 MED ORDER — PROPOFOL 10 MG/ML IV BOLUS
INTRAVENOUS | Status: AC
  Filled 2014-01-01: qty 20

## 2014-01-01 MED ORDER — ACETAMINOPHEN 650 MG RE SUPP: 650.0000 mg | RECTAL | Status: DC | PRN

## 2014-01-01 MED ORDER — ONDANSETRON HCL 4 MG/2ML IJ SOLN: 4.0000 mg | INTRAMUSCULAR | Status: DC | PRN

## 2014-01-01 MED ORDER — INSULIN ASPART 100 UNIT/ML ~~LOC~~ SOLN
0.0000 [IU] | Freq: Every day | SUBCUTANEOUS | Status: DC
  Administered 2014-01-01: 5 [IU] via SUBCUTANEOUS

## 2014-01-01 MED ORDER — HYDROCODONE-ACETAMINOPHEN 5-325 MG PO TABS
1.0000 | ORAL_TABLET | ORAL | Status: DC | PRN
Start: 2014-01-01 — End: 2014-01-03

## 2014-01-01 MED ORDER — SODIUM CHLORIDE 0.9 % IJ SOLN
3.0000 mL | Freq: Two times a day (BID) | INTRAMUSCULAR | Status: DC
  Administered 2014-01-01 – 2014-01-02 (×4): 3 mL via INTRAVENOUS

## 2014-01-01 MED ORDER — THROMBIN 20000 UNITS EX SOLR
CUTANEOUS | Status: DC | PRN
  Administered 2014-01-01: 12:00:00 via TOPICAL

## 2014-01-01 MED ORDER — FENTANYL CITRATE 0.05 MG/ML IJ SOLN
INTRAMUSCULAR | Status: DC | PRN
  Administered 2014-01-01 (×2): 50 ug via INTRAVENOUS
  Administered 2014-01-01: 100 ug via INTRAVENOUS

## 2014-01-01 MED ORDER — PHENOL 1.4 % MT LIQD
1.0000 | OROMUCOSAL | Status: DC | PRN
  Administered 2014-01-01: 1 via OROMUCOSAL

## 2014-01-01 MED ORDER — ROCURONIUM BROMIDE 100 MG/10ML IV SOLN
INTRAVENOUS | Status: DC | PRN
  Administered 2014-01-01: 30 mg via INTRAVENOUS
  Administered 2014-01-01: 10 mg via INTRAVENOUS
  Administered 2014-01-01: 20 mg via INTRAVENOUS

## 2014-01-01 MED ORDER — NEOSTIGMINE METHYLSULFATE 10 MG/10ML IV SOLN
INTRAVENOUS | Status: AC
  Filled 2014-01-01: qty 1

## 2014-01-01 MED ORDER — KETOROLAC TROMETHAMINE 30 MG/ML IJ SOLN
INTRAMUSCULAR | Status: AC
  Administered 2014-01-01: 15 mg
  Filled 2014-01-01: qty 1

## 2014-01-01 MED ORDER — PROPOFOL 10 MG/ML IV BOLUS
INTRAVENOUS | Status: DC | PRN
  Administered 2014-01-01: 200 mg via INTRAVENOUS

## 2014-01-01 MED ORDER — NEOSTIGMINE METHYLSULFATE 10 MG/10ML IV SOLN
INTRAVENOUS | Status: DC | PRN
  Administered 2014-01-01: 4 mg via INTRAVENOUS

## 2014-01-01 MED ORDER — SODIUM CHLORIDE 0.9 % IV SOLN
250.0000 mL | INTRAVENOUS | Status: DC
  Administered 2014-01-01: 250 mL via INTRAVENOUS

## 2014-01-01 MED ORDER — METHOCARBAMOL 500 MG PO TABS
500.0000 mg | ORAL_TABLET | Freq: Four times a day (QID) | ORAL | Status: DC | PRN
  Administered 2014-01-02 – 2014-01-03 (×2): 500 mg via ORAL
  Filled 2014-01-01 (×2): qty 1

## 2014-01-01 MED ORDER — INSULIN ASPART 100 UNIT/ML ~~LOC~~ SOLN
0.0000 [IU] | Freq: Three times a day (TID) | SUBCUTANEOUS | Status: DC
  Administered 2014-01-02: 7 [IU] via SUBCUTANEOUS
  Administered 2014-01-02 (×2): 11 [IU] via SUBCUTANEOUS

## 2014-01-01 MED ORDER — LACTATED RINGERS IV SOLN
INTRAVENOUS | Status: DC | PRN
  Administered 2014-01-01 (×2): via INTRAVENOUS

## 2014-01-01 MED ORDER — ONDANSETRON HCL 4 MG/2ML IJ SOLN: 4.0000 mg | Freq: Once | INTRAMUSCULAR | Status: DC | PRN

## 2014-01-01 MED ORDER — ONDANSETRON HCL 4 MG/2ML IJ SOLN
INTRAMUSCULAR | Status: DC | PRN
  Administered 2014-01-01: 4 mg via INTRAVENOUS

## 2014-01-01 MED ORDER — CANAGLIFLOZIN 300 MG PO TABS
150.0000 mg | ORAL_TABLET | Freq: Every day | ORAL | Status: DC
  Administered 2014-01-01 – 2014-01-02 (×2): 150 mg via ORAL
  Filled 2014-01-01 (×4): qty 1

## 2014-01-01 MED ORDER — TORSEMIDE 20 MG PO TABS
20.0000 mg | ORAL_TABLET | Freq: Every day | ORAL | Status: DC | PRN
  Filled 2014-01-01: qty 1

## 2014-01-01 MED ORDER — MENTHOL 3 MG MT LOZG
1.0000 | LOZENGE | OROMUCOSAL | Status: DC | PRN
  Filled 2014-01-01: qty 9

## 2014-01-01 MED ORDER — THROMBIN 5000 UNITS EX SOLR
OROMUCOSAL | Status: DC | PRN
  Administered 2014-01-01: 12:00:00 via TOPICAL

## 2014-01-01 MED ORDER — HYDROCHLOROTHIAZIDE 12.5 MG PO CAPS
12.5000 mg | ORAL_CAPSULE | Freq: Every day | ORAL | Status: DC
  Administered 2014-01-02: 12.5 mg via ORAL
  Filled 2014-01-01 (×2): qty 1

## 2014-01-01 MED ORDER — DEXAMETHASONE SODIUM PHOSPHATE 10 MG/ML IJ SOLN
INTRAMUSCULAR | Status: DC | PRN
  Administered 2014-01-01: 10 mg via INTRAVENOUS

## 2014-01-01 MED ORDER — EPHEDRINE SULFATE 50 MG/ML IJ SOLN
INTRAMUSCULAR | Status: AC
  Filled 2014-01-01: qty 1

## 2014-01-01 MED ORDER — EZETIMIBE-SIMVASTATIN 10-40 MG PO TABS
1.0000 | ORAL_TABLET | Freq: Every day | ORAL | Status: DC
  Administered 2014-01-01 – 2014-01-02 (×2): 1 via ORAL
  Filled 2014-01-01 (×3): qty 1

## 2014-01-01 MED ORDER — SODIUM CHLORIDE 0.9 % IJ SOLN
INTRAMUSCULAR | Status: AC
  Filled 2014-01-01: qty 10

## 2014-01-01 MED ORDER — LACTATED RINGERS IV SOLN
INTRAVENOUS | Status: DC
  Administered 2014-01-01: 08:00:00 via INTRAVENOUS

## 2014-01-01 MED ORDER — ACETAMINOPHEN 325 MG PO TABS: 650.0000 mg | ORAL_TABLET | ORAL | Status: DC | PRN

## 2014-01-01 MED ORDER — KETOROLAC TROMETHAMINE 15 MG/ML IJ SOLN
15.0000 mg | Freq: Four times a day (QID) | INTRAMUSCULAR | Status: AC
  Administered 2014-01-01 – 2014-01-02 (×5): 15 mg via INTRAVENOUS
  Filled 2014-01-01 (×5): qty 1

## 2014-01-01 MED ORDER — FLUTICASONE PROPIONATE 50 MCG/ACT NA SUSP
2.0000 | Freq: Every day | NASAL | Status: DC
  Administered 2014-01-01 – 2014-01-02 (×2): 2 via NASAL
  Filled 2014-01-01: qty 16

## 2014-01-01 MED ORDER — LORATADINE 10 MG PO TABS
10.0000 mg | ORAL_TABLET | Freq: Every day | ORAL | Status: DC
Start: 2014-01-01 — End: 2014-01-03
  Administered 2014-01-01 – 2014-01-02 (×2): 10 mg via ORAL
  Filled 2014-01-01 (×3): qty 1

## 2014-01-01 MED ORDER — LIDOCAINE HCL (CARDIAC) 20 MG/ML IV SOLN
INTRAVENOUS | Status: AC
  Filled 2014-01-01: qty 5

## 2014-01-01 MED ORDER — BUPIVACAINE HCL (PF) 0.5 % IJ SOLN
INTRAMUSCULAR | Status: DC | PRN
  Administered 2014-01-01: 4 mL

## 2014-01-01 MED ORDER — LOSARTAN POTASSIUM-HCTZ 50-12.5 MG PO TABS: 1.0000 | ORAL_TABLET | Freq: Every day | ORAL | Status: DC

## 2014-01-01 MED ORDER — PANTOPRAZOLE SODIUM 40 MG PO TBEC
40.0000 mg | DELAYED_RELEASE_TABLET | Freq: Every day | ORAL | Status: DC
  Administered 2014-01-02: 40 mg via ORAL
  Filled 2014-01-01: qty 1

## 2014-01-01 MED ORDER — SODIUM CHLORIDE 0.9 % IJ SOLN: 3.0000 mL | INTRAMUSCULAR | Status: DC | PRN

## 2014-01-01 MED ORDER — LIDOCAINE-EPINEPHRINE 1 %-1:100000 IJ SOLN
INTRAMUSCULAR | Status: DC | PRN
  Administered 2014-01-01: 4 mL

## 2014-01-01 MED ORDER — GLYCOPYRROLATE 0.2 MG/ML IJ SOLN
INTRAMUSCULAR | Status: DC | PRN
  Administered 2014-01-01: 6 mg via INTRAVENOUS

## 2014-01-01 MED ORDER — ALPRAZOLAM 0.25 MG PO TABS: 0.2500 mg | ORAL_TABLET | Freq: Every evening | ORAL | Status: DC | PRN

## 2014-01-01 MED ORDER — AZELASTINE HCL 0.1 % NA SOLN
1.0000 | Freq: Two times a day (BID) | NASAL | Status: DC
  Administered 2014-01-01 – 2014-01-02 (×2): 1 via NASAL
  Filled 2014-01-01: qty 30

## 2014-01-01 SURGICAL SUPPLY — 67 items
ALLOGRAFT LORDOTIC 8X11X14 (Bone Implant) ×2 IMPLANT
ALLOGRAFT LORDOTIC CC 7X11X14 (Bone Implant) ×2 IMPLANT
ALLOGRAFT TRIAD LORDOTIC CC (Bone Implant) ×2 IMPLANT
BAG DECANTER FOR FLEXI CONT (MISCELLANEOUS) ×2 IMPLANT
BIT DRILL NEURO 2X3.1 SFT TUCH (MISCELLANEOUS) ×1 IMPLANT
BLADE 10 SAFETY STRL DISP (BLADE) IMPLANT
BNDG GAUZE ELAST 4 BULKY (GAUZE/BANDAGES/DRESSINGS) ×4 IMPLANT
BUR BARREL STRAIGHT FLUTE 4.0 (BURR) ×2 IMPLANT
CANISTER SUCT 3000ML (MISCELLANEOUS) ×2 IMPLANT
CONT SPEC 4OZ CLIKSEAL STRL BL (MISCELLANEOUS) ×2 IMPLANT
DECANTER SPIKE VIAL GLASS SM (MISCELLANEOUS) ×2 IMPLANT
DERMABOND ADHESIVE PROPEN (GAUZE/BANDAGES/DRESSINGS) ×1
DERMABOND ADVANCED (GAUZE/BANDAGES/DRESSINGS)
DERMABOND ADVANCED .7 DNX12 (GAUZE/BANDAGES/DRESSINGS) IMPLANT
DERMABOND ADVANCED .7 DNX6 (GAUZE/BANDAGES/DRESSINGS) ×1 IMPLANT
DRAPE LAPAROTOMY 100X72 PEDS (DRAPES) ×2 IMPLANT
DRAPE MICROSCOPE LEICA (MISCELLANEOUS) ×2 IMPLANT
DRAPE POUCH INSTRU U-SHP 10X18 (DRAPES) ×2 IMPLANT
DRILL NEURO 2X3.1 SOFT TOUCH (MISCELLANEOUS) ×2
DRSG OPSITE 4X5.5 SM (GAUZE/BANDAGES/DRESSINGS) IMPLANT
DRSG TELFA 3X8 NADH (GAUZE/BANDAGES/DRESSINGS) IMPLANT
DURAPREP 6ML APPLICATOR 50/CS (WOUND CARE) ×2 IMPLANT
ELECT REM PT RETURN 9FT ADLT (ELECTROSURGICAL) ×2
ELECTRODE REM PT RTRN 9FT ADLT (ELECTROSURGICAL) ×1 IMPLANT
GAUZE SPONGE 4X4 16PLY XRAY LF (GAUZE/BANDAGES/DRESSINGS) IMPLANT
GLOVE BIO SURGEON STRL SZ 6.5 (GLOVE) ×2 IMPLANT
GLOVE BIO SURGEON STRL SZ7.5 (GLOVE) IMPLANT
GLOVE BIOGEL PI IND STRL 7.0 (GLOVE) ×2 IMPLANT
GLOVE BIOGEL PI IND STRL 7.5 (GLOVE) ×2 IMPLANT
GLOVE BIOGEL PI IND STRL 8.5 (GLOVE) ×1 IMPLANT
GLOVE BIOGEL PI INDICATOR 7.0 (GLOVE) ×2
GLOVE BIOGEL PI INDICATOR 7.5 (GLOVE) ×2
GLOVE BIOGEL PI INDICATOR 8.5 (GLOVE) ×1
GLOVE ECLIPSE 8.5 STRL (GLOVE) ×2 IMPLANT
GLOVE ECLIPSE 9.0 STRL (GLOVE) ×2 IMPLANT
GLOVE EXAM NITRILE LRG STRL (GLOVE) IMPLANT
GLOVE EXAM NITRILE MD LF STRL (GLOVE) IMPLANT
GLOVE EXAM NITRILE XL STR (GLOVE) IMPLANT
GLOVE EXAM NITRILE XS STR PU (GLOVE) IMPLANT
GLOVE SURG SS PI 7.0 STRL IVOR (GLOVE) ×6 IMPLANT
GOWN STRL REUS W/ TWL LRG LVL3 (GOWN DISPOSABLE) ×3 IMPLANT
GOWN STRL REUS W/ TWL XL LVL3 (GOWN DISPOSABLE) ×1 IMPLANT
GOWN STRL REUS W/TWL 2XL LVL3 (GOWN DISPOSABLE) ×2 IMPLANT
GOWN STRL REUS W/TWL LRG LVL3 (GOWN DISPOSABLE) ×3
GOWN STRL REUS W/TWL XL LVL3 (GOWN DISPOSABLE) ×1
HALTER HD/CHIN CERV TRACTION D (MISCELLANEOUS) ×2 IMPLANT
HEMOSTAT POWDER SURGIFOAM 1G (HEMOSTASIS) ×2 IMPLANT
KIT BASIN OR (CUSTOM PROCEDURE TRAY) ×2 IMPLANT
KIT ROOM TURNOVER OR (KITS) ×2 IMPLANT
NEEDLE HYPO 22GX1.5 SAFETY (NEEDLE) ×2 IMPLANT
NEEDLE SPNL 22GX3.5 QUINCKE BK (NEEDLE) ×4 IMPLANT
NS IRRIG 1000ML POUR BTL (IV SOLUTION) ×2 IMPLANT
PACK LAMINECTOMY NEURO (CUSTOM PROCEDURE TRAY) ×2 IMPLANT
PAD ARMBOARD 7.5X6 YLW CONV (MISCELLANEOUS) ×6 IMPLANT
PATTIES SURGICAL .5 X.5 (GAUZE/BANDAGES/DRESSINGS) ×2 IMPLANT
PATTIES SURGICAL 1X1 (DISPOSABLE) ×2 IMPLANT
PLATE ARCHON 1-LEVEL 22MM (Plate) ×2 IMPLANT
PLATE ARCHON 2-LEVEL 40MM (Plate) ×2 IMPLANT
RUBBERBAND STERILE (MISCELLANEOUS) ×4 IMPLANT
SCREW ARCHON ST VAR 4.0X11MM (Screw) ×20 IMPLANT
SPONGE INTESTINAL PEANUT (DISPOSABLE) ×2 IMPLANT
SPONGE SURGIFOAM ABS GEL 100 (HEMOSTASIS) ×2 IMPLANT
SUT VIC AB 3-0 SH 8-18 (SUTURE) ×4 IMPLANT
SYR 20ML ECCENTRIC (SYRINGE) ×2 IMPLANT
TOWEL OR 17X24 6PK STRL BLUE (TOWEL DISPOSABLE) ×2 IMPLANT
TOWEL OR 17X26 10 PK STRL BLUE (TOWEL DISPOSABLE) ×2 IMPLANT
WATER STERILE IRR 1000ML POUR (IV SOLUTION) ×2 IMPLANT

## 2014-01-01 NOTE — Progress Notes (Signed)
Patient ID: Belinda [name], female   DOB: [birth year], 63 y.o.   MRN: [id number] Incision is clean and dry. Motor function is intact. Patient appears comfortable. Swallowing intact also.

## 2014-01-01 NOTE — Plan of Care (Signed)
Problem: Consults Goal: Diagnosis - Spinal Surgery Outcome: Completed/Met Date Met:  01/01/14 Cervical Spine Fusion     

## 2014-01-01 NOTE — Transfer of Care (Signed)
Immediate Anesthesia Transfer of Care Note  Patient: Belinda [name]  Procedure(s) Performed: Procedure(s) with comments: CERVICAL THREE-FOUR,CERVICAL FIVE-SIX,CERVICAL SIX-SEVEN ANTERIOR CERVICAL DECOMPRESSION/DISCECTOMY/FUSION (N/A) - left-side approach  Patient Location: PACU  Anesthesia Type:General  Level of Consciousness: awake, alert  and oriented  Airway & Oxygen Therapy: Patient Spontanous Breathing and Patient connected to nasal cannula oxygen  Post-op Assessment: Report given to PACU RN, Post -op Vital signs reviewed and stable and Patient moving all extremities X 4  Post vital signs: Reviewed and stable  Complications: No apparent anesthesia complications

## 2014-01-01 NOTE — Anesthesia Procedure Notes (Signed)
Procedure Name: Intubation Date/Time: 01/01/2014 10:42 AM Performed by: Carney Living Pre-anesthesia Checklist: Patient identified, Emergency Drugs available, Suction available, Patient being monitored and Timeout performed Patient Re-evaluated:Patient Re-evaluated prior to inductionOxygen Delivery Method: Circle system utilized Preoxygenation: Pre-oxygenation with 100% oxygen Intubation Type: IV induction Ventilation: Mask ventilation without difficulty Laryngoscope Size: Mac and 4 Grade View: Grade II Tube type: Oral Tube size: 7.5 mm Number of attempts: 1 Airway Equipment and Method: Stylet Placement Confirmation: ETT inserted through vocal cords under direct vision,  positive ETCO2 and breath sounds checked- equal and bilateral Secured at: 21 cm Tube secured with: Tape Dental Injury: Teeth and Oropharynx as per pre-operative assessment  Comments: Very minimal neck extension with intubation

## 2014-01-01 NOTE — Op Note (Signed)
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 Arthrodesis with structural allograft C3-4 C5-6 and C6-C7. An anterior plate fixation [id number] and C5-C7. Surgeon: Kristeen Miss First assistant: Vernice Jefferson Anesthesia: Gen. endotracheal Indications: [name] is a 63 year old individual is had significant problems with neck shoulder and arm pain she has evidence of advanced spondylitic disease at C3-C4 she has spondylosis at C4-C5 which is formed and auto fusion and there is no evidence of cord compression or nerve root compromise. She has advanced spondylosis with stenosis centrally and and lateral recesses at C5-6 and also at C6-C7. She's been advised regarding the need for surgical decompression of all these processes C3-C7. She is now taken to the operating room.  Procedure: The patient was brought to the operating room supine on a stretcher. After the smooth induction of general endotracheal anesthesia she was placed in 5 pounds of halter traction. The neck was prepped with alcohol and DuraPrep and draped in a sterile fashion. A transverse incision was created and left-sided neck and this was carried down through the platysma. The plane between the sternocleidomastoid and strap muscles dissected bluntly until the first prevertebral space was reached. This space was identified at C3-C4 localizing radiograph. The lungs: Muscle was stripped off of either side of midline, a self-retaining retractor was placed in the wound. The anterior portion of the disc space was then opened fully with a 15 blade. A combination of curettes and rongeurs was used to evacuate a significant quantity of moderately severely degenerated disc material. The endplates are clean. A high-speed  this with 2 mm drill was then used to clean up or drift the bony osteophytes from the inferior margin of C3 and also the superior margin of C4. Interspace was cleared until the ligament to be taken up with a 1 and 2 mm Kerrison punch. This then allowed for decompression of the dura. Dissection was carried out laterally to the left and to the right. Large osteophytic overgrowth and lateral recesses from the vertebral body of C4 were cleared and removed. Hemostasis in the lateral recesses was then obtained using bipolar cautery and some small pledgets of Gelfoam soaked thrombin which were later irrigated away. The endplates were then shaved smooth. It is felt that in this space an 8 mm spacer would fit best. This was placed and countersunk appropriately. Then 22 mm nuvasive plate was placed to the ventral aspect of the vertebral bodies and fixed with 12 mm locking screws. Attention was then turned to C5-6 and C6-C7 C5-6 was localized with a radiograph which identified good placement of the hardware and a plate at [id number]. The retractors were removed down the neck into this position.  At C5-C6 and ventral osteophytes were again cleared the interspace was found to be moderately tight and it was gradually drilled open with a 2 mm high-speed drill. Osteophytic overgrowth from the inferior endplate of C5 and superior endplate of C6 were again drilled down and ultimately disc and ligament were removed with a 2 mm Kerrison punch. This decompress the central portion of the dura. Dissection was carried out laterally to decompress the nerve roots of C6 on either side. Hemostasis was similarly achieved. The endplates were then smoothed with a 4 mm barrel bit. The interspace was sized and it is felt that a 7 mm interspace graft would fit  best into this interval. Once this was countersunk the retractors were moved to C6-C7 and this space was decompressed in a similar fashion. This interspace was noted to be tighter than the other  tube. Dear a 6 mm graft would fit and allow for some distraction of the interspace. This was even after lateral recesses had been cleared of significant bony osteophytosis. Hemostasis in the lateral recesses was achieved using some Gelfoam soaked thrombin patties which were irrigated away. Once the grafts were placed traction was removed. A 40 mm standard size nuvasive plate was fitted to the ventral aspect of the vertebral bodies. This was secured with 6 locking 4 x 12 mm screws. Final radiographs confirm good placement of the hardware and interbody spacers. Hemostasis was checked and the soft tissues. When this was verified the platysma was closed with 3-0 Vicryl in interrupted fashion and 3-0 Vicryl was used to close the subcuticular skin. Blood loss was estimated at approximately 75 cc.

## 2014-01-01 NOTE — H&P (Signed)
Belinda [name] is an 63 y.o. female.   Chief Complaint: Neck shoulder arm pain and some mild weakness. HPI: Patient is a 63 year old individual whom I been following for significant problems with back pain and intermittent neck pain for a number of years. She started developed some weakness diffusely in the upper extremities noted some stiffness in her hands an MRI was performed. This demonstrates presence of substantial spinal cord compression at C3-4 and then again at C5-6 and C6-C7 with flattening of the cord and by foraminal stenosis C4-5 appears to be relatively spared with normal anatomy. She has weakness in her right arm with a decreased grip and some weakness in the wrist extensor. Is on fairly mild no atrophy is noted in the intrinsics. She's been advised regarding the need for surgical decompression of C3-4 C5-6 and C6-C7. She is now admitted for this procedure.  Past Medical History  Diagnosis Date  . Hypertension   . Hyperlipidemia   . Back pain     intractable low back  . Palpitations   . Anemia     Hx. of  . Hx MRSA infection   . Osteoarthritis   . Dysrhythmia     palpitations or heart racing periodic  . Diabetes mellitus     insulin dependent - fasting 140-200  . Anxiety   . GERD (gastroesophageal reflux disease)   . Migraines     Past Surgical History  Procedure Laterality Date  . Other surgical history      c section x 1  . Hand surgery      left hand  . Other surgical history      d&c x 2  . Nasal sinus surgery    . Abdominal hysterectomy    . Knee surgery Left     torn mensicus  . Eye surgery Bilateral     cataracts    History reviewed. No pertinent family history. Social History:  reports that she has never smoked. She does not have any smokeless tobacco history on file. She reports that she does not drink alcohol or use illicit drugs.  Allergies:  Allergies  Allergen Reactions  . Aspirin     Can tolerate low aspirin  . Augmentin [Amoxicillin-Pot  Clavulanate]     GI issues  . Erythromycin     Gi issues  . Other     Omnicef-GI issues    Medications Prior to Admission  Medication Sig Dispense Refill  . acetaminophen (TYLENOL ARTHRITIS PAIN) 650 MG CR tablet Take 1,300 mg by mouth daily as needed for pain.       [name] ALPRAZolam (XANAX) 0.25 MG tablet Take 0.25 mg by mouth at bedtime as needed for anxiety.      . Ascorbic Acid (VITAMIN C) 1000 MG tablet Take 1,000 mg by mouth at bedtime.      [name] azelastine (ASTELIN) 137 MCG/SPRAY nasal spray Place 1 spray into the nose 2 (two) times daily. Use in each nostril as directed       . Canagliflozin (INVOKANA) 300 MG TABS Take 150 mg by mouth daily.       . Cholecalciferol (VITAMIN D-3) 5000 UNITS TABS Take 5,000 Units by mouth daily.      . COD LIVER OIL PO Take 1 tablet by mouth daily.      . Evening Primrose Oil 1000 MG CAPS Take 1,000 mg by mouth 2 (two) times daily.      [name] ezetimibe-simvastatin (VYTORIN) 10-40 MG per tablet Take 1  tablet by mouth daily.      . ferrous gluconate (FERGON) 324 MG tablet Take 324 mg by mouth daily with breakfast.      . fexofenadine (ALLEGRA) 180 MG tablet Take 180 mg by mouth daily.      . Flaxseed, Linseed, (FLAXSEED OIL) 1200 MG CAPS Take 1,200 mg by mouth 2 (two) times daily.      [name] guaiFENesin (MUCINEX) 600 MG 12 hr tablet Take 600 mg by mouth daily.      . hydrochlorothiazide (MICROZIDE) 12.5 MG capsule Take 12.5 mg by mouth daily.      [name] HYDROcodone-acetaminophen (NORCO/VICODIN) 5-325 MG per tablet Take 1 tablet by mouth every 6 (six) hours as needed for moderate pain.      . Ibuprofen (ADVIL MIGRAINE) 200 MG CAPS Take 400 mg by mouth daily as needed (migraine headache).      [name] ibuprofen (ADVIL,MOTRIN) 200 MG tablet Take 600 mg by mouth every 6 (six) hours as needed for mild pain.      [name] losartan-hydrochlorothiazide (HYZAAR) 50-12.5 MG per tablet Take 1 tablet by mouth at bedtime.      . Naproxen Sodium (ALEVE) 220 MG CAPS Take 220-440 mg by mouth daily  as needed (pain).      . NASONEX 50 MCG/ACT nasal spray Place 2 sprays into the nose at bedtime. As needed      . omeprazole (PRILOSEC) 20 MG capsule Take 20 mg by mouth at bedtime.       [name] OVER THE COUNTER MEDICATION Take 1 tablet by mouth daily. AlphaBetic Multivitamin      . OVER THE COUNTER MEDICATION Take 1 tablet by mouth daily. Menorelief  tabs      . OVER THE COUNTER MEDICATION Take 1 tablet by mouth 2 (two) times daily. isoflabones 40 mg for hot flashes      . potassium chloride (K-DUR,KLOR-CON) 10 MEQ tablet Take 20 mEq by mouth 2 (two) times daily.      [name] torsemide (DEMADEX) 20 MG tablet Take 20 mg by mouth daily as needed (for fluid).       . vitamin E 400 UNIT capsule Take 400 Units by mouth daily.      . B-D INS SYR HALF-UNIT .3CC/31G 31G X 5/16" 0.3 ML MISC As directed      . insulin regular human CONCENTRATED (HUMULIN R) 500 UNIT/ML SOLN injection Inject into the skin See admin instructions. 14 units at lunch and 6 units at bedtime She uses a BD 3/10 syringe, 31 gauge      . nitroGLYCERIN (NITROSTAT) 0.4 MG SL tablet Place 0.4 mg under the tongue every 5 (five) minutes as needed for chest pain.      . ONE TOUCH ULTRA TEST test strip As needed        Results for orders placed during the hospital encounter of 01/01/14 (from the past 48 hour(s))  CBC     Status: Abnormal   Collection Time    01/01/14  7:41 AM      Result Value Ref Range   WBC 8.4  4.0 - 10.5 K/uL   RBC 4.30  3.87 - 5.11 MIL/uL   Hemoglobin 11.0 (*) 12.0 - 15.0 g/dL   HCT 33.5 (*) 36.0 - 46.0 %   MCV 77.9 (*) 78.0 - 100.0 fL   MCH 25.6 (*) 26.0 - 34.0 pg   MCHC 32.8  30.0 - 36.0 g/dL   RDW 14.5  11.5 - 15.5 %   Platelets  256  150 - 400 K/uL  BASIC METABOLIC PANEL     Status: Abnormal   Collection Time    01/01/14  7:41 AM      Result Value Ref Range   Sodium 141  137 - 147 mEq/L   Potassium 4.0  3.7 - 5.3 mEq/L   Chloride 96  96 - 112 mEq/L   CO2 27  19 - 32 mEq/L   Glucose, Bld 220 (*) 70 - 99 mg/dL    BUN 33 (*) 6 - 23 mg/dL   Creatinine, Ser 1.06  0.50 - 1.10 mg/dL   Calcium 10.0  8.4 - 10.5 mg/dL   GFR calc non Af Amer 55 (*) >90 mL/min   GFR calc Af Amer 64 (*) >90 mL/min   Comment: (NOTE)     The eGFR has been calculated using the CKD EPI equation.     This calculation has not been validated in all clinical situations.     eGFR's persistently <90 mL/min signify possible Chronic Kidney     Disease.   Anion gap 18 (*) 5 - 15  GLUCOSE, CAPILLARY     Status: Abnormal   Collection Time    01/01/14  7:49 AM      Result Value Ref Range   Glucose-Capillary 227 (*) 70 - 99 mg/dL   No results found.  Review of Systems  Eyes: Negative.   Respiratory: Negative.   Cardiovascular: Negative.   Gastrointestinal: Negative.   Genitourinary: Negative.   Musculoskeletal: Positive for neck pain.  Neurological: Positive for sensory change and weakness.  Psychiatric/Behavioral: Negative.     Blood pressure 129/68, pulse 101, temperature 98.3 F (36.8 C), resp. rate 20, height _0  (1.549 m), weight 73.029 kg (161 lb), SpO2 100.00%. Physical Exam  Constitutional: She appears well-developed and well-nourished.  HENT:  Head: Normocephalic and atraumatic.  Eyes: Conjunctivae and EOM are normal. Pupils are equal, round, and reactive to light.  Neck:  Decreased range of motion of the neck with positive Spurling sign on right.  Cardiovascular: Normal rate and regular rhythm.   Respiratory: Effort normal and breath sounds normal.  GI: Soft. Bowel sounds are normal.  Musculoskeletal:  Limited range of motion of neck turning only 45 left and right flexion and extension limited to 50% of normal. Axial compression reproduces centralized neck pain. Diffuse upper extremity weakness to 4+ out of 5 with or simple effort area and  Neurological:  Absent reflexes in both biceps and triceps 2+ reflexes in patella 3+ reflexes and Achilles.  Skin: Skin is warm and dry.  Psychiatric: She has a normal  mood and affect. Her behavior is normal. Judgment and thought content normal.     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  Patient is admitted to undergo surgical decompression and stabilization at C3-4 C5-6 and C6-C7.  Aloise Copus J 01/01/2014, 10:12 AM

## 2014-01-01 NOTE — Anesthesia Preprocedure Evaluation (Addendum)
Anesthesia Evaluation  Patient identified by MRN, date of birth, ID band Patient awake    Reviewed: Allergy & Precautions, H&P , NPO status , Patient's Chart, lab work & pertinent test results  Airway Mallampati: II TM Distance: >3 FB Neck ROM: Full    Dental  (+) Teeth Intact, Dental Advisory Given   Pulmonary          Cardiovascular hypertension, Pt. on medications + dysrhythmias     Neuro/Psych  Headaches, Anxiety    GI/Hepatic GERD-  Medicated and Controlled,  Endo/Other  diabetes, Type 2, Oral Hypoglycemic Agents  Renal/GU      Musculoskeletal   Abdominal   Peds  Hematology   Anesthesia Other Findings   Reproductive/Obstetrics                          Anesthesia Physical Anesthesia Plan  ASA: III  Anesthesia Plan: General   Post-op Pain Management:    Induction: Intravenous  Airway Management Planned: Oral ETT  Additional Equipment:   Intra-op Plan:   Post-operative Plan: Extubation in OR  Informed Consent: I have reviewed the patients History and Physical, chart, labs and discussed the procedure including the risks, benefits and alternatives for the proposed anesthesia with the patient or authorized representative who has indicated his/her understanding and acceptance.   Dental advisory given  Plan Discussed with: CRNA, Anesthesiologist and Surgeon  Anesthesia Plan Comments:        Anesthesia Quick Evaluation

## 2014-01-01 NOTE — Progress Notes (Signed)
Orthopedic Tech Progress Note Patient Details:  Belinda [name] 17-Jul-1950 [id number]  Ortho Devices Type of Ortho Device: Soft collar Ortho Device/Splint Interventions: Application   Cammer, Theodoro Parma 01/01/2014, 4:30 PM

## 2014-01-02 DIAGNOSIS — M4712 Other spondylosis with myelopathy, cervical region: Secondary | ICD-10-CM | POA: Diagnosis present

## 2014-01-02 DIAGNOSIS — M4722 Other spondylosis with radiculopathy, cervical region: Secondary | ICD-10-CM

## 2014-01-02 LAB — GLUCOSE, CAPILLARY
Glucose-Capillary: 286 mg/dL — ABNORMAL HIGH (ref 70–99)
Glucose-Capillary: 244 mg/dL — ABNORMAL HIGH (ref 70–99)
Glucose-Capillary: 296 mg/dL — ABNORMAL HIGH (ref 70–99)
Glucose-Capillary: 156 mg/dL — ABNORMAL HIGH (ref 70–99)

## 2014-01-02 MED ORDER — HYDROCODONE-ACETAMINOPHEN 5-325 MG PO TABS: 1.0000 | ORAL_TABLET | ORAL | Status: DC | PRN

## 2014-01-02 MED ORDER — METHOCARBAMOL 500 MG PO TABS: 500.0000 mg | ORAL_TABLET | Freq: Four times a day (QID) | ORAL | Status: DC | PRN

## 2014-01-02 NOTE — Progress Notes (Signed)
Patient ID: Belinda [name], female   DOB: 12-22-1950, 63 y.o.   MRN: [id number] Feels dizzy. Left upper extremity slight weakness. Motor function ok. Incision clean and dry. PLAN discharge for tomorrow.

## 2014-01-02 NOTE — Discharge Summary (Signed)
Physician Discharge Summary  Patient ID: Belinda [name] MRN: [id number] DOB/AGE: February 08, 1951 63 y.o.  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  Diabetes Melitus Active Problems:   Cervical spondylosis with myelopathy   Cervical spondylosis with myelopathy and radiculopathy   Discharged Condition: good  Hospital Course: Patient tolerated surgery well. Motor function ok/  Consults: None  Significant Diagnostic Studies: none  Treatments: decompression C34, C56 C67 artrodesis with allograft anterior plate fixation  Discharge Exam: Blood pressure 121/68, pulse 98, temperature 98.3 F (36.8 C), temperature source Oral, resp. rate 18, height 5\' 1"  (1.549 m), weight 73.029 kg (161 lb), SpO2 96.00%. Incision clean and dry. Motor signs of myelo0pathy still present. with stiffness of upper extremities and poor mobiliztion of lower ext.  Disposition:home with homel health PT and OT.     Discharge Instructions   Call MD for:  redness, tenderness, or signs of infection (pain, swelling, redness, odor or green/yellow discharge around incision site)    Complete by:  As directed      Call MD for:  severe uncontrolled pain    Complete by:  As directed      Call MD for:  temperature >100.4    Complete by:  As directed      Diet - low sodium heart healthy    Complete by:  As directed      Discharge instructions    Complete by:  As directed   Okay to shower. Do not apply salves or appointments to incision. No heavy lifting with the upper extremities greater than 15 pounds. May resume driving when not requiring pain medication and patient feels comfortable with doing so.     Increase activity slowly    Complete by:  As directed             Medication List         ADVIL MIGRAINE 200 MG Caps   Generic drug:  Ibuprofen  Take 400 mg by mouth daily as needed (migraine headache).     ibuprofen 200 MG tablet  Commonly known as:  ADVIL,MOTRIN  Take 600 mg by mouth every 6 (six) hours as needed for mild pain.     ALEVE 220 MG Caps  Generic drug:  Naproxen Sodium  Take 220-440 mg by mouth daily as needed (pain).     ALPRAZolam 0.25 MG tablet  Commonly known as:  XANAX  Take 0.25 mg by mouth at bedtime as needed for anxiety.     azelastine 0.1 % nasal spray  Commonly known as:  ASTELIN  Place 1 spray into the nose 2 (two) times daily. Use in each nostril as directed     B-D INS SYR HALF-UNIT .3CC/31G 31G X 5/16" 0.3 ML Misc  Generic drug:  Insulin Syringe-Needle U-100  As directed     COD LIVER OIL PO  Take 1 tablet by mouth daily.     Evening Primrose Oil 1000 MG Caps  Take 1,000 mg by mouth 2 (two) times daily.     ezetimibe-simvastatin 10-40 MG per tablet  Commonly known as:  VYTORIN  Take 1 tablet by mouth daily.     ferrous gluconate 324 MG tablet  Commonly known as:  FERGON  Take 324 mg by mouth daily with breakfast.     fexofenadine 180 MG tablet  Commonly known as:  ALLEGRA  Take 180  mg by mouth daily.     Flaxseed Oil 1200 MG Caps  Take 1,200 mg by mouth 2 (two) times daily.     guaiFENesin 600 MG 12 hr tablet  Commonly known as:  MUCINEX  Take 600 mg by mouth daily.     hydrochlorothiazide 12.5 MG capsule  Commonly known as:  MICROZIDE  Take 12.5 mg by mouth daily.     HYDROcodone-acetaminophen 5-325 MG per tablet  Commonly known as:  NORCO/VICODIN  Take 1 tablet by mouth every 6 (six) hours as needed for moderate pain.     HYDROcodone-acetaminophen 5-325 MG per tablet  Commonly known as:  NORCO/VICODIN  Take 1-2 tablets by mouth every 4 (four) hours as needed (mild pain).     insulin regular human CONCENTRATED 500 UNIT/ML Soln injection  Commonly known as:  HUMULIN R  - Inject into the skin See admin instructions. 14 units at lunch and 6  units at bedtime  - She uses a BD 3/10 syringe, 31 gauge     INVOKANA 300 MG Tabs  Generic drug:  Canagliflozin  Take 150 mg by mouth daily.     losartan-hydrochlorothiazide 50-12.5 MG per tablet  Commonly known as:  HYZAAR  Take 1 tablet by mouth at bedtime.     methocarbamol 500 MG tablet  Commonly known as:  ROBAXIN  Take 1 tablet (500 mg total) by mouth every 6 (six) hours as needed for muscle spasms.     NASONEX 50 MCG/ACT nasal spray  Generic drug:  mometasone  Place 2 sprays into the nose at bedtime. As needed     nitroGLYCERIN 0.4 MG SL tablet  Commonly known as:  NITROSTAT  Place 0.4 mg under the tongue every 5 (five) minutes as needed for chest pain.     omeprazole 20 MG capsule  Commonly known as:  PRILOSEC  Take 20 mg by mouth at bedtime.     ONE TOUCH ULTRA TEST test strip  Generic drug:  glucose blood  As needed     OVER THE COUNTER MEDICATION  Take 1 tablet by mouth daily. AlphaBetic Multivitamin     OVER THE COUNTER MEDICATION  Take 1 tablet by mouth daily. Menorelief  tabs     OVER THE COUNTER MEDICATION  Take 1 tablet by mouth 2 (two) times daily. isoflabones 40 mg for hot flashes     potassium chloride 10 MEQ tablet  Commonly known as:  K-DUR,KLOR-CON  Take 20 mEq by mouth 2 (two) times daily.     torsemide 20 MG tablet  Commonly known as:  DEMADEX  Take 20 mg by mouth daily as needed (for fluid).     TYLENOL ARTHRITIS PAIN 650 MG CR tablet  Generic drug:  acetaminophen  Take 1,300 mg by mouth daily as needed for pain.     vitamin C 1000 MG tablet  Take 1,000 mg by mouth at bedtime.     Vitamin D-3 5000 UNITS Tabs  Take 5,000 Units by mouth daily.     vitamin E 400 UNIT capsule  Take 400 Units by mouth daily.           Follow-up Information   Follow up with [facility]. (Someone from [facility] will contact you concerning start date and time for physical therapy.)    Contact information:   [address] [place] [address] [phone]       Signed: Earleen Newport 01/02/2014, 6:11 PM

## 2014-01-02 NOTE — Care Management Note (Signed)
CARE MANAGEMENT NOTE 01/02/2014  Patient:  Belinda [name], Belinda [name]   Account Number:  [id number]  Date Initiated:  01/02/2014  Documentation initiated by:  Ricki Miller  Subjective/Objective Assessment:   63 yr old female s/p C6-7 ACDF     Action/Plan:   Case manager spoke with patient concerning home health and DME needs at discharge.Choice offered. Referral called to [facility] liasion. Has family support at discharge.   Anticipated DC Date:  01/03/2014   Anticipated DC Plan:  [facility]  CM consult      [facility] Choice  HOME HEALTH  DURABLE MEDICAL EQUIPMENT   Choice offered to / List presented to:  C-1 Patient   DME arranged  Belinda [name]      DME agency  [place] arranged  [place].   Status of service:  Completed, signed off Medicare Important Message given?   (If response is "NO", the following Medicare IM given date fields will be blank) Date Medicare IM given:   Medicare IM given by:   Date Additional Medicare IM given:   Additional Medicare IM given by:    Discharge Disposition:  [place]  Per UR Regulation:  Reviewed for med. necessity/level of care/duration of stay  If discussed at [place] of Stay Meetings, dates discussed:    Comments:

## 2014-01-02 NOTE — Anesthesia Postprocedure Evaluation (Signed)
  Anesthesia Post-op Note  Patient: Belinda [name]  Procedure(s) Performed: Procedure(s) with comments: CERVICAL THREE-FOUR,CERVICAL FIVE-SIX,CERVICAL SIX-SEVEN ANTERIOR CERVICAL DECOMPRESSION/DISCECTOMY/FUSION (N/A) - left-side approach  Patient Location: PACU  Anesthesia Type:General  Level of Consciousness: awake, alert , oriented and patient cooperative  Airway and Oxygen Therapy: Patient Spontanous Breathing  Post-op Pain: mild  Post-op Assessment: Post-op Vital signs reviewed, Patient's Cardiovascular Status Stable, Respiratory Function Stable, Patent Airway, No signs of Nausea or vomiting and Pain level controlled  Post-op Vital Signs: stable  Last Vitals:  Filed Vitals:   01/02/14 0405  BP: 111/69  Pulse: 95  Temp: 37.1 C  Resp: 18    Complications: No apparent anesthesia complications

## 2014-01-02 NOTE — Evaluation (Signed)
Physical Therapy Evaluation Patient Details Name: Belinda [name] MRN: [id number] DOB: 08/29/1950 Today's Date: 01/02/2014   History of Present Illness  Pt presents with UE numbness and weakness and underwent ACDF C3-7.  Clinical Impression  Pt admitted with ACDF C3-7 and post op dizziness as well as prior h/o LBP and left knee OA and weakness. Pt currently with functional limitations due to the deficits listed below (see PT Problem List). Pt ambulated 34' but required RW for safety and independence. Pt will benefit from skilled PT to increase their independence and safety with mobility to allow discharge to the venue listed below. PT signing off and defer further treatment to HHPT.     Follow Up Recommendations Home health PT;Supervision - Intermittent    Equipment Recommendations  Rolling walker with 5" wheels    Recommendations for Other Services OT consult     Precautions / Restrictions Precautions Precautions: Cervical;Fall Precaution Comments: reviewed cervical precautions and proper positioning and posture Required Braces or Orthoses: Cervical Brace Cervical Brace: Soft collar;For comfort Restrictions Weight Bearing Restrictions: No      Mobility  Bed Mobility Overal bed mobility: Modified Independent             General bed mobility comments: pt performed bed mobility keeping precaution with only vc's for proper mvmt patterns  Transfers Overall transfer level: Modified independent Equipment used: None             General transfer comment: pt able to perform sit to stand without physical assist, guarded closely as has been dizzy since surgery  Ambulation/Gait Ambulation/Gait assistance: Min guard;Min assist Ambulation Distance (Feet): 70 Feet Assistive device: 1 person hand held assist;Rolling walker (2 wheeled) Gait Pattern/deviations: Step-through pattern;Decreased stride length;Trunk flexed Gait velocity: decreased   General Gait Details: pt began  ambulation without AD but needed min HHA for balance and was very slow and guarded. Given RW and was able to increase pace as well as required min-guard/ supervision only. Pt reports she feels much safer with RW.   Stairs            Wheelchair Mobility    Modified Rankin (Stroke Patients Only)       Balance Overall balance assessment: Needs assistance Sitting-balance support: No upper extremity supported Sitting balance-Leahy Scale: Good     Standing balance support: No upper extremity supported;During functional activity Standing balance-Leahy Scale: Fair Standing balance comment: cannot accept challenges and anxious in standing without AD                             Pertinent Vitals/Pain 5/10 cervical pain, positioned for comfort    Home Living Family/patient expects to be discharged to:: Private residence Living Arrangements: Spouse/significant other Available Help at Discharge: Family;Available 24 hours/day Type of Home: House Home Access: Stairs to enter Entrance Stairs-Rails: None Entrance Stairs-Number of Steps: 1 Home Layout: One level Home Equipment: Cane - single point Additional Comments: pt's husband is disabled from car accident but cares for himself and pt's sister can help her if needed    Prior Function Level of Independence: Independent         Comments: pt reports h/o falls due to low back dysfunction as well as OA left knee, needs TKA per pt.      Hand Dominance   Dominant Hand: Left    Extremity/Trunk Assessment   Upper Extremity Assessment: Defer to OT evaluation  Lower Extremity Assessment: LLE deficits/detail   LLE Deficits / Details: obvious swelling left knee, ROM sufficient for self care but stiffness evident with motion, no buckling with ambulation but decreased strength of quad noted with sit to stand, roughly 4/5  Cervical / Trunk Assessment: Kyphotic  Communication   Communication: No difficulties   Cognition Arousal/Alertness: Awake/alert Behavior During Therapy: WFL for tasks assessed/performed Overall Cognitive Status: Within Functional Limits for tasks assessed                      General Comments      Exercises Other Exercises Other Exercises: supine pelvic tilt for bed mobility and abdominal activation 5x      Assessment/Plan    PT Assessment All further PT needs can be met in the next venue of care  PT Diagnosis Abnormality of gait;Difficulty walking;Acute pain   PT Problem List Decreased strength;Decreased activity tolerance;Decreased balance;Decreased mobility;Decreased knowledge of use of DME;Decreased knowledge of precautions;Pain  PT Treatment Interventions     PT Goals (Current goals can be found in the Care Plan section) Acute Rehab PT Goals Patient Stated Goal: return home PT Goal Formulation: No goals set, d/c therapy    Frequency     Barriers to discharge        Co-evaluation               End of Session Equipment Utilized During Treatment: Gait belt;Cervical collar Activity Tolerance: Patient tolerated treatment well Patient left: in bed;with call bell/phone within reach Nurse Communication: Mobility status    Functional Assessment Tool Used: clinical judgement Functional Limitation: Mobility: Walking and moving around Mobility: Walking and Moving Around Current Status ([id number]): At least 1 percent but less than 20 percent impaired, limited or restricted Mobility: Walking and Moving Around Goal Status [phone]): At least 1 percent but less than 20 percent impaired, limited or restricted Mobility: Walking and Moving Around Discharge Status [phone]): At least 1 percent but less than 20 percent impaired, limited or restricted    Time: 0830-0900 PT Time Calculation (min): 30 min   Charges:   PT Evaluation $Initial PT Evaluation Tier I: 1 Procedure PT Treatments $Gait Training: 8-22 mins $Therapeutic Activity: 8-22 mins   PT G  Codes:   Functional Assessment Tool Used: clinical judgement Functional Limitation: Mobility: Walking and moving around   [facility], PT  Acute Rehab Services  [place], [place] 01/02/2014, 9:13 AM

## 2014-01-02 NOTE — Evaluation (Addendum)
Occupational Therapy Evaluation Patient Details Name: Belinda [name] MRN: [id number] DOB: 30-Oct-1950 Today's Date: 01/02/2014    History of Present Illness Pt presents with UE numbness and weakness and underwent ACDF C3-7.   Clinical Impression   Pt presents with UE weakness more pronounced in L UE than R. Needs intermittent verbal cues for following cervical precautions. She is limited by dizziness but still overall did well with activity. Will have husband available to assist at home. Recommend HHOT to followup for cervical precaution reinforcement and UE strengthening program. She has a higher toilet at home with vanity next to it so dont feel she needs 3in1. No further acute OT needs.    Follow Up Recommendations  Home health OT;Supervision/Assistance - 24 hour    Equipment Recommendations  None recommended by OT    Recommendations for Other Services       Precautions / Restrictions Precautions Precautions: Cervical;Fall Precaution Comments: reviewed cervical precautions and proper positioning and posture Required Braces or Orthoses: Cervical Brace Cervical Brace: Soft collar;For comfort Restrictions Weight Bearing Restrictions: No      Mobility Bed Mobility Overal bed mobility: Needs Assistance Bed Mobility: Rolling;Sit to Sidelying Rolling: Supervision       Sit to sidelying: Supervision General bed mobility comments: min verbal cues for precautions.  Transfers Overall transfer level: Needs assistance Equipment used: None Transfers: Sit to/from Stand Sit to Stand: Min guard         General transfer comment: min verbal cues    Balance Stood to manage clothing with min guard assist.                                     ADL Overall ADL's : Needs assistance/impaired Eating/Feeding: Independent;Sitting   Grooming: Wash/dry hands;Min guard;Standing Grooming Details (indicate cue type and reason): min guard due to dizziness Upper Body  Bathing: Supervision/ safety;Set up;Sitting Upper Body Bathing Details (indicate cue type and reason): supervision for cervical precautions Lower Body Bathing: Min guard;Sit to/from stand   Upper Body Dressing : Minimal assistance;Sitting Upper Body Dressing Details (indicate cue type and reason): difficulty with buttons due to decreased sensation in bilateral hand Lower Body Dressing: Min guard;Sit to/from stand Lower Body Dressing Details (indicate cue type and reason): verbal cues for cervical precautions Toilet Transfer: Minimal assistance;Ambulation;Comfort height toilet;Grab bars Toilet Transfer Details (indicate cue type and reason): min assist --hand held. pt is dizzy.  Toileting- Water quality scientist and Hygiene: Min guard;Sit to/from stand         General ADL Comments: reviewed all cervical precautions and handout. Pt reports feeling dizzy when up with OT and with return to bed pt reports room spinning. Nursing made aware. Pt states she has had issues with dizziness with previous surgery. Pt needs intermittant verbal cues for following cervical precautions as she tends to want to look down. Discussed easiest clothing items to wear. Advised pt to ask MD regarding showering and if ok to remove collar to shower. Pt has a built in shower seat so advised her that when she does shower, it would be best to sit down so she can cross her LEs up and wash and maintain cervical precautions. Practiced log roll and needed min cues to stay on her side more.      Vision                     Perception  Praxis      Pertinent Vitals/Pain 5/10 at start of session anterior neck; 6/10 with activity. Reposition, rest.     Hand Dominance Left   Extremity/Trunk Assessment Upper Extremity Assessment Upper Extremity Assessment: RUE deficits/detail;LUE deficits/detail RUE Deficits / Details: grip weak but stronger than left. strength grossly 4+/5 elbow, wrist. reports numbness in entire  hand. RUE Sensation: decreased light touch LUE Deficits / Details: much decreased with grip and pt is left handed. elbow 4+/5, wrist extension 4/5, not able to fully flex at wrist-only slightly past neutral. LUE Sensation: decreased light touch      Cervical / Trunk Assessment Cervical / Trunk Assessment: Kyphotic   Communication Communication Communication: No difficulties   Cognition Arousal/Alertness: Awake/alert Behavior During Therapy: WFL for tasks assessed/performed Overall Cognitive Status: Within Functional Limits for tasks assessed                     General Comments       Exercises Encouraged pt to perform wrist flexion /extension exercises and digit ROM, grip. Bilateral.    Shoulder Instructions      Home Living Family/patient expects to be discharged to:: Private residence Living Arrangements: Spouse/significant other Available Help at Discharge: Family;Available 24 hours/day Type of Home: House Home Access: Stairs to enter [employer] of Steps: 1 Entrance Stairs-Rails: None Home Layout: One level     Bathroom Shower/Tub: Occupational psychologist: Handicapped height     Home Equipment: Cane - single point;Shower seat - built in   Additional Comments: pt's husband is disabled from car accident but cares for himself and pt's sister can help her if needed      Prior Functioning/Environment Level of Independence: Independent        Comments: pt reports h/o falls due to low back dysfunction as well as OA left knee, needs TKA per pt.     OT Diagnosis: Generalized weakness   OT Problem List: Decreased strength;Decreased knowledge of precautions   OT Treatment/Interventions:      OT Goals(Current goals can be found in the care plan section) Acute Rehab OT Goals Patient Stated Goal: return home  OT Frequency:     Barriers to D/C:            Co-evaluation              End of Session Equipment Utilized During  Treatment: Cervical collar  Activity Tolerance: Other (comment) (dizziness but did well with activity) Patient left: in bed;with call bell/phone within reach   Time: 0900-0939 OT Time Calculation (min): 39 min Charges:  OT General Charges $OT Visit: 1 Procedure OT Evaluation $Initial OT Evaluation Tier I: 1 Procedure OT Treatments $Self Care/Home Management : 8-22 mins $Therapeutic Activity: 8-22 mins G-Codes:    Jules Schick [id number] 01/02/2014, 9:54 AM

## 2014-01-03 LAB — GLUCOSE, CAPILLARY
Glucose-Capillary: 227 mg/dL — ABNORMAL HIGH (ref 70–99)
Glucose-Capillary: 233 mg/dL — ABNORMAL HIGH (ref 70–99)

## 2014-01-03 MED ORDER — OXYCODONE-ACETAMINOPHEN 5-325 MG PO TABS: 1.0000 | ORAL_TABLET | ORAL | Status: DC | PRN

## 2014-01-03 NOTE — Progress Notes (Signed)
Pt given D/C instructions with Rx's, verbal understanding of teaching was provided. Pt's Home Health was setup prior to D/C. Pt's IV was removed prior to D/C. Pt D/C'd home via wheelchair with soft collar @ 1050 per MD order. Pt is stable @ D/C and has no other needs. Holli Humbles, RN

## 2014-01-03 NOTE — Discharge Instructions (Signed)

## 2014-01-04 ENCOUNTER — Encounter (HOSPITAL_COMMUNITY): Payer: Self-pay | Admitting: Neurological Surgery

## 2014-02-08 ENCOUNTER — Other Ambulatory Visit: Payer: Self-pay

## 2014-02-08 MED ORDER — POTASSIUM CHLORIDE CRYS ER 10 MEQ PO TBCR: 20.0000 meq | EXTENDED_RELEASE_TABLET | Freq: Two times a day (BID) | ORAL | Status: DC

## 2014-02-08 MED ORDER — HYDROCHLOROTHIAZIDE 12.5 MG PO CAPS: 12.5000 mg | ORAL_CAPSULE | Freq: Every day | ORAL | Status: DC

## 2014-04-09 ENCOUNTER — Ambulatory Visit (INDEPENDENT_AMBULATORY_CARE_PROVIDER_SITE_OTHER): Payer: Medicare Other | Admitting: Cardiology

## 2014-04-09 ENCOUNTER — Encounter: Payer: Self-pay | Admitting: Cardiology

## 2014-04-09 VITALS — BP 124/64 | HR 101 | Ht 61.0 in | Wt 158.0 lb

## 2014-04-09 DIAGNOSIS — E785 Hyperlipidemia, unspecified: Secondary | ICD-10-CM

## 2014-04-09 DIAGNOSIS — R0609 Other forms of dyspnea: Secondary | ICD-10-CM

## 2014-04-09 DIAGNOSIS — R002 Palpitations: Secondary | ICD-10-CM

## 2014-04-09 DIAGNOSIS — I119 Hypertensive heart disease without heart failure: Secondary | ICD-10-CM

## 2014-04-09 DIAGNOSIS — R0600 Dyspnea, unspecified: Secondary | ICD-10-CM

## 2014-04-09 DIAGNOSIS — Z23 Encounter for immunization: Secondary | ICD-10-CM

## 2014-04-09 MED ORDER — POTASSIUM CHLORIDE CRYS ER 10 MEQ PO TBCR: 20.0000 meq | EXTENDED_RELEASE_TABLET | Freq: Two times a day (BID) | ORAL | Status: DC

## 2014-04-09 MED ORDER — LOSARTAN POTASSIUM-HCTZ 50-12.5 MG PO TABS: 1.0000 | ORAL_TABLET | Freq: Every day | ORAL | Status: DC

## 2014-04-09 MED ORDER — TORSEMIDE 20 MG PO TABS: 20.0000 mg | ORAL_TABLET | Freq: Every day | ORAL | Status: DC | PRN

## 2014-04-09 MED ORDER — HYDROCHLOROTHIAZIDE 12.5 MG PO CAPS: ORAL_CAPSULE | ORAL | Status: DC

## 2014-04-09 NOTE — Patient Instructions (Signed)
[place] for flu vaccine today  Your physician wants you to follow-up in: 6 month ov You will receive a reminder letter in the mail two months in advance. If you don't receive a letter, please call our office to schedule the follow-up appointment.   Your physician recommends that you continue on your current medications as directed. Please refer to the Current Medication list given to you today.

## 2014-04-09 NOTE — Assessment & Plan Note (Signed)
No recent palpitations.  EKG today shows sinus tachycardia at 101 beats per minute with no ectopic beats.

## 2014-04-09 NOTE — Assessment & Plan Note (Signed)
She has not been having any hypoglycemic episodes.

## 2014-04-09 NOTE — Progress Notes (Signed)
Belinda [name] Date of Birth:  [birth year] [place] [address] [place], [place]  [address] [phone]        Fax   [phone]   History of Present Illness: This pleasant 62 year old [place] American woman is seen for a scheduled 6 month followup office visit. She has a history of essential hypertension, hypercholesterolemia, insulin-dependent diabetes mellitus, and palpitations. She also has chronic back problems for which she is on medical disability.  In July Dr. Ellene Route performed surgery on her cervical spine which went well. Her diabetes is followed closely by Dr. Forde Dandy. Since last visit she's had no new cardiac symptoms. She notes occasional chest pain relieved by sublingual nitroglycerin. She has lost 5 more pounds since last visit.    She has a history of fluid retention and takes Demadex once or twice a week.  She also takes hydrochlorothiazide in the form of Microzide 2 tablets a day.  Generic HCTZ does not work for her. She has not had to take any recent sublingual nitroglycerin.   Current Outpatient Prescriptions  Medication Sig Dispense Refill  . acetaminophen (TYLENOL ARTHRITIS PAIN) 650 MG CR tablet Take 1,300 mg by mouth daily as needed for pain.       [name] ALPRAZolam (XANAX) 0.25 MG tablet Take 0.25 mg by mouth at bedtime as needed for anxiety.      . Ascorbic Acid (VITAMIN C) 1000 MG tablet Take 1,000 mg by mouth at bedtime.      [name] azelastine (ASTELIN) 137 MCG/SPRAY nasal spray Place 1 spray into the nose 2 (two) times daily. Use in each nostril as directed       . B-D INS SYR HALF-UNIT .3CC/31G 31G X 5/16" 0.3 ML MISC As directed      . Canagliflozin (INVOKANA) 300 MG TABS Take 150 mg by mouth daily.       . Cholecalciferol (VITAMIN D-3) 5000 UNITS TABS Take 5,000 Units by mouth daily.      . COD LIVER OIL PO Take 1 tablet by mouth daily.      . Evening Primrose Oil 1000 MG CAPS Take 1,000 mg by mouth 2 (two) times daily.      [name]  ezetimibe-simvastatin (VYTORIN) 10-40 MG per tablet Take 1 tablet by mouth daily.      . ferrous gluconate (FERGON) 324 MG tablet Take 324 mg by mouth daily with breakfast.      . fexofenadine (ALLEGRA) 180 MG tablet Take 180 mg by mouth daily.      . Flaxseed, Linseed, (FLAXSEED OIL) 1200 MG CAPS Take 1,200 mg by mouth 2 (two) times daily.      [name] guaiFENesin (MUCINEX) 600 MG 12 hr tablet Take 600 mg by mouth daily.      . hydrochlorothiazide (MICROZIDE) 12.5 MG capsule 2 tablets once a day  60 capsule  11  . HYDROcodone-acetaminophen (NORCO/VICODIN) 5-325 MG per tablet Take 1-2 tablets by mouth every 4 (four) hours as needed (mild pain).  60 tablet  0  . Ibuprofen (ADVIL MIGRAINE) 200 MG CAPS Take 400 mg by mouth daily as needed (migraine headache).      [name] ibuprofen (ADVIL,MOTRIN) 200 MG tablet Take 600 mg by mouth every 6 (six) hours as needed for mild pain.      [name] insulin regular human CONCENTRATED (HUMULIN R) 500 UNIT/ML SOLN injection Inject into the skin See admin instructions. 14 units at lunch and 6 units at bedtime She uses a BD 3/10  syringe, 31 gauge      . losartan-hydrochlorothiazide (HYZAAR) 50-12.5 MG per tablet Take 1 tablet by mouth at bedtime.  90 tablet  3  . Naproxen Sodium (ALEVE) 220 MG CAPS Take 220-440 mg by mouth daily as needed (pain).      . NASONEX 50 MCG/ACT nasal spray Place 2 sprays into the nose at bedtime. As needed      . nitroGLYCERIN (NITROSTAT) 0.4 MG SL tablet Place 0.4 mg under the tongue every 5 (five) minutes as needed for chest pain.      [name] omeprazole (PRILOSEC) 20 MG capsule Take 20 mg by mouth at bedtime.       . ONE TOUCH ULTRA TEST test strip As needed      . OVER THE COUNTER MEDICATION Take 1 tablet by mouth daily. AlphaBetic Multivitamin      . OVER THE COUNTER MEDICATION Take 1 tablet by mouth daily. Menorelief  tabs      . OVER THE COUNTER MEDICATION Take 1 tablet by mouth 2 (two) times daily. isoflabones 40 mg for hot flashes      . potassium  chloride (K-DUR,KLOR-CON) 10 MEQ tablet Take 2 tablets (20 mEq total) by mouth 2 (two) times daily.  120 tablet  11  . torsemide (DEMADEX) 20 MG tablet Take 1 tablet (20 mg total) by mouth daily as needed (for fluid).  30 tablet  11  . vitamin E 400 UNIT capsule Take 400 Units by mouth daily.       No current facility-administered medications for this visit.    Allergies  Allergen Reactions  . Aspirin     Can tolerate low aspirin  . Augmentin [Amoxicillin-Pot Clavulanate]     GI issues  . Erythromycin     Gi issues  . Other     Omnicef-GI issues    Patient Active Problem List   Diagnosis Date Noted  . Cervical spondylosis with myelopathy and radiculopathy 01/02/2014  . Cervical spondylosis with myelopathy 01/01/2014  . Wound check, abscess 06/14/2013  . Cellulitis and abscess of buttock - right 06/12/2013  . Dyspnea on exertion 03/22/2011  . Chest pain 03/22/2011  . Diabetes mellitus   . Back pain   . Palpitations   . Anemia   . Hx MRSA infection   . Osteoarthritis     History  Smoking status  . Never Smoker   Smokeless tobacco  . Not on file    History  Alcohol Use No    No family history on file.  Review of Systems: Constitutional: no fever chills diaphoresis or fatigue or change in weight.  Head and neck: no hearing loss, no epistaxis, no photophobia or visual disturbance. Respiratory: No cough, shortness of breath or wheezing. Cardiovascular: No chest pain peripheral edema, palpitations. Gastrointestinal: No abdominal distention, no abdominal pain, no change in bowel habits hematochezia or melena. Genitourinary: No dysuria, no frequency, no urgency, no nocturia. Musculoskeletal:No arthralgias, no back pain, no gait disturbance or myalgias. Neurological: No dizziness, no headaches, no numbness, no seizures, no syncope, no weakness, no tremors. Hematologic: No lymphadenopathy, no easy bruising. Psychiatric: No confusion, no hallucinations, no sleep  disturbance.    Physical Exam: Filed Vitals:   04/09/14 1548  BP: 124/64  Pulse: 101  The patient appears to be in no distress.  Head and neck exam reveals that the pupils are equal and reactive.  The extraocular movements are full.  There is no scleral icterus.  Mouth and pharynx are benign.  No lymphadenopathy.  No carotid bruits.  The jugular venous pressure is normal.  Thyroid is not enlarged or tender.  Chest is clear to percussion and auscultation.  No rales or rhonchi.  Expansion of the chest is symmetrical.  Heart reveals no abnormal lift or heave.  First and second heart sounds are normal.  There is no murmur gallop rub or click.  The abdomen is soft and nontender.  Bowel sounds are normoactive.  There is no hepatosplenomegaly or mass.  There are no abdominal bruits.  Extremities reveal no phlebitis or edema.  Pedal pulses are good.  There is no cyanosis or clubbing.  Neurologic exam is normal strength and no lateralizing weakness.  No sensory deficits.  Integument reveals no rash  EKG shows sinus tachycardia and poor R-wave progression in anterior leads and is unchanged since 06/01/13.  Assessment / Plan: 1. essential hypertension 2. Hypercholesterolemia 3. chronic low back pain on disability 4. diabetes mellitus adult onset insulin-dependent. 5. palpitations  Disposition: Continue same medication.  I gave her a flu shot at her request today.  Recheck in 6 months for followup office visit.  Continue close medical and diabetes followup with Dr. Carrolyn Meiers

## 2014-04-09 NOTE — Assessment & Plan Note (Signed)
She has not been having any significant exertional chest discomfort she has occasional racing and skipping of her heart.  She has had very little chest discomfort.  She has not had any episodes of syncope.

## 2014-04-12 ENCOUNTER — Encounter: Payer: Self-pay | Admitting: Cardiology

## 2014-06-14 ENCOUNTER — Other Ambulatory Visit: Payer: Self-pay | Admitting: Cardiology

## 2014-06-17 ENCOUNTER — Other Ambulatory Visit: Payer: Self-pay | Admitting: Cardiology

## 2014-06-28 DIAGNOSIS — Z9289 Personal history of other medical treatment: Secondary | ICD-10-CM

## 2014-06-28 HISTORY — DX: Personal history of other medical treatment: Z92.89

## 2014-11-12 ENCOUNTER — Encounter: Payer: Self-pay | Admitting: Cardiology

## 2014-11-12 ENCOUNTER — Ambulatory Visit (INDEPENDENT_AMBULATORY_CARE_PROVIDER_SITE_OTHER): Payer: Medicare Other | Admitting: Cardiology

## 2014-11-12 VITALS — BP 100/50 | HR 108 | Ht 61.0 in | Wt 164.1 lb

## 2014-11-12 DIAGNOSIS — R002 Palpitations: Secondary | ICD-10-CM | POA: Diagnosis not present

## 2014-11-12 DIAGNOSIS — R0609 Other forms of dyspnea: Secondary | ICD-10-CM

## 2014-11-12 DIAGNOSIS — R0600 Dyspnea, unspecified: Secondary | ICD-10-CM

## 2014-11-12 DIAGNOSIS — I493 Ventricular premature depolarization: Secondary | ICD-10-CM | POA: Diagnosis not present

## 2014-11-12 DIAGNOSIS — I119 Hypertensive heart disease without heart failure: Secondary | ICD-10-CM | POA: Diagnosis not present

## 2014-11-12 NOTE — Progress Notes (Signed)
Cardiology Office Note   Date:  11/12/2014   ID:  Belinda [name], DOB September 26, 1950, MRN [id number]  PCP:  Belinda Stack, MD  Cardiologist: Belinda Coco MD  No chief complaint on file.     History of Present Illness: Belinda [name] is a 64 y.o. female who presents for a six-month follow-up office visit  This pleasant 64 year old African American woman is seen for a scheduled 6 month followup office visit. She has a history of essential hypertension, hypercholesterolemia, insulin-dependent diabetes mellitus, and palpitations. She also has chronic back problems for which she is on medical disability. In July 2015 Dr. Ellene Route performed surgery on her cervical spine which went well. Her diabetes is followed closely by Dr. Forde Dandy. Since last visit she's had no new cardiac symptoms. She notes occasional chest pain relieved by sublingual nitroglycerin.  She notes occasional premature beats. She has gained 6 pounds since last visit. She has a history of fluid retention and takes Demadex once or twice a week. She also takes hydrochlorothiazide in the form of Microzide.  She has developed painful left knee arthritis and anticipates having to have a total knee replacement by Dr. Amada Jupiter in July.  Past Medical History  Diagnosis Date  . Hypertension   . Hyperlipidemia   . Back pain     intractable low back  . Palpitations   . Anemia     Hx. of  . Hx MRSA infection   . Osteoarthritis   . Dysrhythmia     palpitations or heart racing periodic  . Diabetes mellitus     insulin dependent - fasting 140-200  . Anxiety   . GERD (gastroesophageal reflux disease)   . Migraines     Past Surgical History  Procedure Laterality Date  . Other surgical history      c section x 1  . Hand surgery      left hand  . Other surgical history      d&c x 2  . Nasal sinus surgery    . Abdominal hysterectomy    . Knee surgery Left     torn mensicus  . Eye surgery Bilateral    cataracts  . Anterior cervical decomp/discectomy fusion N/A 01/01/2014    Procedure: CERVICAL THREE-FOUR,CERVICAL FIVE-SIX,CERVICAL SIX-SEVEN ANTERIOR CERVICAL DECOMPRESSION/DISCECTOMY/FUSION;  Surgeon: Belinda Miss, MD;  Location: [facility];  Service: Neurosurgery;  Laterality: N/A;  left-side approach     Current Outpatient Prescriptions  Medication Sig Dispense Refill  . acetaminophen (TYLENOL ARTHRITIS PAIN) 650 MG CR tablet Take 1,300 mg by mouth daily as needed for pain.     [name] ALPRAZolam (XANAX) 0.25 MG tablet Take 0.25 mg by mouth at bedtime as needed for anxiety.    . Ascorbic Acid (VITAMIN C) 1000 MG tablet Take 1,000 mg by mouth at bedtime.    [name] azelastine (ASTELIN) 137 MCG/SPRAY nasal spray Place 1 spray into the nose 2 (two) times daily. Use in each nostril as directed     . B-D INS SYR HALF-UNIT .3CC/31G 31G X 5/16" 0.3 ML MISC As directed    . Canagliflozin (INVOKANA) 300 MG TABS Take 150 mg by mouth daily.     . Cholecalciferol (VITAMIN D-3) 5000 UNITS TABS Take 5,000 Units by mouth daily.    . COD LIVER OIL PO Take 1 tablet by mouth daily.    . Evening Primrose Oil 1000 MG CAPS Take 1,000 mg by mouth 2 (two) times daily.    [name] ezetimibe-simvastatin (VYTORIN)  10-40 MG per tablet Take 1 tablet by mouth daily.    . ferrous gluconate (FERGON) 324 MG tablet Take 324 mg by mouth daily with breakfast.    . fexofenadine (ALLEGRA) 180 MG tablet Take 180 mg by mouth daily.    . Flaxseed, Linseed, (FLAXSEED OIL) 1200 MG CAPS Take 1,200 mg by mouth 2 (two) times daily.    [name] guaiFENesin (MUCINEX) 600 MG 12 hr tablet Take 600 mg by mouth daily.    . hydrochlorothiazide (MICROZIDE) 12.5 MG capsule 2 tablets once a day 60 capsule 11  . Ibuprofen (ADVIL MIGRAINE) 200 MG CAPS Take 400 mg by mouth daily as needed (migraine headache).    . insulin regular human CONCENTRATED (HUMULIN R) 500 UNIT/ML SOLN injection Inject into the skin See admin instructions. 14 units at lunch and 6 units at  bedtime She uses a BD 3/10 syringe, 31 gauge    . losartan-hydrochlorothiazide (HYZAAR) 50-12.5 MG per tablet Take 1 tablet by mouth at bedtime. 90 tablet 3  . Naproxen Sodium (ALEVE) 220 MG CAPS Take 220-440 mg by mouth daily as needed (pain).    . NASONEX 50 MCG/ACT nasal spray Place 2 sprays into the nose at bedtime. As needed    . nitroGLYCERIN (NITROSTAT) 0.4 MG SL tablet Place 0.4 mg under the tongue every 5 (five) minutes as needed for chest pain.    [name] omeprazole (PRILOSEC) 20 MG capsule Take 20 mg by mouth at bedtime.     . ONE TOUCH ULTRA TEST test strip As needed    . OVER THE COUNTER MEDICATION Take 1 tablet by mouth daily. AlphaBetic Multivitamin    . OVER THE COUNTER MEDICATION Take 1 tablet by mouth daily. Menorelief  tabs    . OVER THE COUNTER MEDICATION Take 1 tablet by mouth 2 (two) times daily. isoflabones 40 mg for hot flashes    . potassium chloride (K-DUR,KLOR-CON) 10 MEQ tablet Take 2 tablets (20 mEq total) by mouth 2 (two) times daily. 120 tablet 11  . torsemide (DEMADEX) 20 MG tablet Take 1 tablet (20 mg total) by mouth daily as needed (for fluid). 30 tablet 11  . vitamin E 400 UNIT capsule Take 400 Units by mouth daily.     No current facility-administered medications for this visit.    Allergies:   Aspirin; Augmentin; Erythromycin; and Other    Social History:  The patient  reports that she has never smoked. She does not have any smokeless tobacco history on file. She reports that she does not drink alcohol or use illicit drugs.   Family History:  The patient's family history includes Heart disease in her mother; Heart failure in her father. her father died of congestive heart failure in December 2015.  Her mother is alive at age 54 and has Alzheimer's.   ROS:  Please see the history of present illness.   Otherwise, review of systems are positive for none.   All other systems are reviewed and negative.    PHYSICAL EXAM: VS:  BP 100/50 mmHg  Pulse 108  Ht 5'  1" (1.549 m)  Wt 164 lb 1.9 oz (74.444 kg)  BMI 31.03 kg/m2 , BMI Body mass index is 31.03 kg/(m^2). GEN: Well nourished, well developed, in no acute distress HEENT: normal Neck: no JVD, carotid bruits, or masses Cardiac: RRR; no murmurs, rubs, or gallops,no edema  Respiratory:  clear to auscultation bilaterally, normal work of breathing GI: soft, nontender, nondistended, + BS MS: no deformity or atrophy Skin:  warm and dry, no rash Neuro:  Strength and sensation are intact Psych: euthymic mood, full affect   EKG:  EKG is ordered today. The ekg ordered today demonstrates normal sinus rhythm with occasional unifocal PVCs.   Recent Labs: 01/01/2014: BUN 33*; Creatinine 1.06; Hemoglobin 11.0*; Platelets 256; Potassium 4.0; Sodium 141    Lipid Panel No results found for: CHOL, TRIG, HDL, CHOLHDL, VLDL, LDLCALC, LDLDIRECT    Wt Readings from Last 3 Encounters:  11/12/14 164 lb 1.9 oz (74.444 kg)  04/09/14 158 lb (71.668 kg)  01/01/14 161 lb (73.029 kg)        ASSESSMENT AND PLAN:  1. essential hypertension 2. Hypercholesterolemia 3. chronic low back pain on disability 4. diabetes mellitus adult onset insulin-dependent. 5. Palpitations.  Occasional PVCs are noted on EKG today 6.  Osteoarthritis of left knee.  Possible need for total knee replacement this summer.  From a cardiology standpoint she is an acceptable risk for surgery.  Disposition: Continue current medication.  Recheck in 6 months for office visit and EKG. Continue close medical follow-up with Dr. Forde Dandy     Current medicines are reviewed at length with the patient today.  The patient does not have concerns regarding medicines.  The following changes have been made:  no change  Labs/ tests ordered today include:   Orders Placed This Encounter  Procedures  . EKG 12-Lead     Signed, Belinda Coco MD 11/12/2014 5:03 PM    [facility] [place], [place], [place]   [address] Phone: [phone]; Fax: [phone]

## 2014-11-12 NOTE — Patient Instructions (Signed)
Medication Instructions:  Your physician recommends that you continue on your current medications as directed. Please refer to the Current Medication list given to you today.  Labwork: none  Testing/Procedures: none  Follow-Up: Your physician wants you to follow-up in: 6 month ov/ekg  You will receive a reminder letter in the mail two months in advance. If you don't receive a letter, please call our office to schedule the follow-up appointment.     

## 2015-01-28 ENCOUNTER — Other Ambulatory Visit: Payer: Self-pay | Admitting: Physician Assistant

## 2015-01-28 NOTE — H&P (Signed)
  This is a 64 year-old female who presents to our clinic today with continued bilateral knee pain, left greater than right.  Belinda [name] has a history of end stage degenerative joint disease, which has progressively worsened.  She is having rest and night pain, which are both becoming intolerable.  She is having to use a cane with ambulation.  She has tried Corticosteroid and Visco supplementation injections.  She comes in today ready to proceed with bilateral sequential total knee replacements, left greater than right.   Past medical, social and family history reviewed in detail on the patient questionnaire and signed.  Review of systems: As detailed in HPI.  All others reviewed and are negative.   EXAMINATION: Well-developed, well-nourished female in no acute distress.  Alert and oriented x 3.  Examination of both knees reveal trace effusion.  Range of motion 0-110 degrees.  Right knee varus deformity, partially correctable.  Medial joint line tenderness.  Negative log roll.  Positive straight leg raise.  Left knee reveals lateral joint line tenderness.   Valgus deformity.  Negative log roll.  Positive straight leg raise.  She is neurovascularly intact distally.    X-RAYS: X-rays of her left knee reveal bone on bone in the lateral and patellofemoral compartments.  Near 50% joint space narrowing medially.    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   PLAN: At this point in time it is evident that the only thing that will help Belinda [name] is a total knee replacement.  We are going to proceed with the left knee first.  Risks, benefits and possible complications of surgery have been reviewed.  Rehab and recovery time discussed. All questions were answered.  Paperwork complete.  Belinda [name] does have a nickel allergy so we will most likely be using a [employer] system.  She will need clearance from her primary care physician, Dr. Forde Dandy, as well as Dr. Mare Ferrari, cardiologist, prior to operative  intervention.  We will see Belinda [name] just prior to operative intervention for a history and physical.    Belinda [name], M.D.

## 2015-01-31 ENCOUNTER — Encounter (HOSPITAL_COMMUNITY)
Admission: RE | Admit: 2015-01-31 | Discharge: 2015-01-31 | Disposition: A | Discharge status: Home / Self Care | Payer: Medicare Other | Source: Ambulatory Visit | Attending: Orthopedic Surgery | Admitting: Orthopedic Surgery

## 2015-01-31 ENCOUNTER — Encounter (HOSPITAL_COMMUNITY): Payer: Self-pay

## 2015-01-31 DIAGNOSIS — Z981 Arthrodesis status: Secondary | ICD-10-CM | POA: Insufficient documentation

## 2015-01-31 DIAGNOSIS — Z794 Long term (current) use of insulin: Secondary | ICD-10-CM | POA: Insufficient documentation

## 2015-01-31 DIAGNOSIS — M179 Osteoarthritis of knee, unspecified: Secondary | ICD-10-CM | POA: Insufficient documentation

## 2015-01-31 DIAGNOSIS — I10 Essential (primary) hypertension: Secondary | ICD-10-CM | POA: Diagnosis not present

## 2015-01-31 DIAGNOSIS — E119 Type 2 diabetes mellitus without complications: Secondary | ICD-10-CM | POA: Insufficient documentation

## 2015-01-31 DIAGNOSIS — E785 Hyperlipidemia, unspecified: Secondary | ICD-10-CM | POA: Diagnosis not present

## 2015-01-31 DIAGNOSIS — Z0183 Encounter for blood typing: Secondary | ICD-10-CM | POA: Diagnosis not present

## 2015-01-31 DIAGNOSIS — K219 Gastro-esophageal reflux disease without esophagitis: Secondary | ICD-10-CM | POA: Diagnosis not present

## 2015-01-31 DIAGNOSIS — Z79899 Other long term (current) drug therapy: Secondary | ICD-10-CM | POA: Diagnosis not present

## 2015-01-31 DIAGNOSIS — Z01818 Encounter for other preprocedural examination: Secondary | ICD-10-CM | POA: Insufficient documentation

## 2015-01-31 DIAGNOSIS — Z01812 Encounter for preprocedural laboratory examination: Secondary | ICD-10-CM | POA: Insufficient documentation

## 2015-01-31 LAB — COMPREHENSIVE METABOLIC PANEL
ALT: 21 U/L (ref 14–54)
AST: 23 U/L (ref 15–41)
Albumin: 4.4 g/dL (ref 3.5–5.0)
Alkaline Phosphatase: 86 U/L (ref 38–126)
Anion gap: 9 (ref 5–15)
BUN: 24 mg/dL — ABNORMAL HIGH (ref 6–20)
CO2: 29 mmol/L (ref 22–32)
Calcium: 10.1 mg/dL (ref 8.9–10.3)
Chloride: 102 mmol/L (ref 101–111)
Creatinine, Ser: 1.05 mg/dL — ABNORMAL HIGH (ref 0.44–1.00)
GFR calc Af Amer: >60 mL/min (ref >60)
GFR calc non Af Amer: 55 mL/min — ABNORMAL LOW (ref >60)
Glucose, Bld: 98 mg/dL (ref 65–99)
Potassium: 3.4 mmol/L — ABNORMAL LOW (ref 3.5–5.1)
Sodium: 140 mmol/L (ref 135–145)
Total Bilirubin: 0.7 mg/dL (ref 0.3–1.2)
Total Protein: 7.2 g/dL (ref 6.5–8.1)

## 2015-01-31 LAB — CBC WITH DIFFERENTIAL/PLATELET
Basophils Absolute: 0.1 10*3/uL (ref 0.0–0.1)
Basophils Relative: 1 % (ref 0–1)
Eosinophils Absolute: 0.3 10*3/uL (ref 0.0–0.7)
Eosinophils Relative: 4 % (ref 0–5)
HCT: 34.3 % — ABNORMAL LOW (ref 36.0–46.0)
Hemoglobin: 11.3 g/dL — ABNORMAL LOW (ref 12.0–15.0)
Lymphocytes Relative: 41 % (ref 12–46)
Lymphs Abs: 3.2 10*3/uL (ref 0.7–4.0)
MCH: 25.2 pg — ABNORMAL LOW (ref 26.0–34.0)
MCHC: 32.9 g/dL (ref 30.0–36.0)
MCV: 76.4 fL — ABNORMAL LOW (ref 78.0–100.0)
Monocytes Absolute: 0.4 10*3/uL (ref 0.1–1.0)
Monocytes Relative: 5 % (ref 3–12)
Neutro Abs: 3.8 10*3/uL (ref 1.7–7.7)
Neutrophils Relative %: 49 % (ref 43–77)
Platelets: 281 10*3/uL (ref 150–400)
RBC: 4.49 MIL/uL (ref 3.87–5.11)
RDW: 15.3 % (ref 11.5–15.5)
WBC: 7.8 10*3/uL (ref 4.0–10.5)

## 2015-01-31 LAB — SURGICAL PCR SCREEN
MRSA, PCR: NEGATIVE
Staphylococcus aureus: POSITIVE — AB

## 2015-01-31 LAB — GLUCOSE, CAPILLARY: Glucose-Capillary: 140 mg/dL — ABNORMAL HIGH (ref 65–99)

## 2015-01-31 LAB — PROTIME-INR
INR: 0.92 (ref 0.00–1.49)
Prothrombin Time: 12.6 seconds (ref 11.6–15.2)

## 2015-01-31 LAB — ABO/RH: ABO/RH(D): A POS

## 2015-01-31 LAB — APTT: aPTT: 24 seconds (ref 24–37)

## 2015-01-31 NOTE — Progress Notes (Signed)
I called a prescription for Mupirocin ointment to [facility] [address], [place], [place].

## 2015-01-31 NOTE — Progress Notes (Signed)
Mrs Belinda [name] denies chest pain or shortness of breath, she states that she does have palpations at times and has had for years, denies lightheadness or nausea .  Dr Belinda [name] is aware. Mrs Belinda [name] PCP  Is Dr Belinda [name], patient is unaware of last A1C,.    Mrs Belinda [name] is taking several herbal medications for pain, hot flashes. Patient express concern stopping them 1 week prior to surgery.  As I read the name of each one, she asked if she needs to store it and I respond "yes".

## 2015-01-31 NOTE — Pre-Procedure Instructions (Signed)
Belinda [name]  01/31/2015      Your procedure is scheduled on Wednesday, August 17..  Report to [facility] Admitting a 6:30 A.M.  Call this number if you have problems the morning of surgery: [phone]                 For any other questions, please call [phone], Monday - Friday 8 AM - 4 PM.   Remember:  Do not eat food or drink liquids after midnight : Tuesday, August 16.  Take these medicines the morning of surgery with A SIP OF WATER : fexofenadine (ALLEGRA), guaiFENesin (Tenino).                   Take if Needed:omeprazole (PRILOSEC), nitroGLYCERIN (NITROSTAT).                       What do I do about my diabetes medications?                       Contact the MD that manages your Diabetes for instructions reguarding Dose reduction of  Insulin for the evening before surgery.                       Make sure that the doctor who takes care of your diabetes knows about your planned surgery including the date and location.                 On August 10, STOP taking Aspirin, Aspirin Products, Aleve (Naproxen), Ibuprofen (Advil), Vitamins and all Herbal Medications.   How to Manage Your Diabetes Before Surgery   Why is it important to control my blood sugar before and after surgery?   Improving blood sugar levels before and after surgery helps healing and can limit problems.  A way of improving blood sugar control is eating a healthy diet by:  - Eating less sugar and carbohydrates  - Increasing activity/exercise  - Talk with your doctor about reaching your blood sugar goals  High blood sugars (greater than 180 mg/dL) can raise your risk of infections and slow down your recovery so you will need to focus on controlling your diabetes during the weeks before surgery.  How do I manage my blood sugars before surgery?   Check your blood sugar at least 4 times a day, 2 days before surgery to make sure that they are not too high or low.   Check your blood sugar  the morning of your surgery when you wake up and every 2  hours until you get to the Short-Stay unit.  If your blood sugar is less than 70 mg/dL, you will need to treat for low blood sugar by:  Treat a low blood sugar (less than 70 mg/dL) with 1/2 cup of clear juice (cranberry or apple), 4 glucose tablets, OR glucose gel.  Recheck blood sugar in 15 minutes after treatment (to make sure it is greater than 70 mg/dL).  If blood sugar is not greater than 70 mg/dL on re-check, call [phone] for further instructions.   Report your blood sugar to the Short-Stay nurse when you get to Short-Stay.  References:  [facility], 2007 "How to Manage your Diabetes Before and After Surgery"    Do not wear jewelry, make-up or nail polish.  Do not wear lotions, powders, or perfumes.    Do not shave 48 hours prior to  surgery.    Do not bring valuables to the hospital.  [facility] is not responsible for any belongings or valuables.  Contacts, dentures or bridgework may not be worn into surgery.  Leave your suitcase in the car.  After surgery it may be brought to your room.  Special Instructions: Review  [facility] - Preparing For Surgery.   Please read over the following fact sheets that you were given. Pain Booklet, Coughing and Deep Breathing, Blood Transfusion Information and Surgical Site Infection Prevention and Incentive Spirometry.

## 2015-01-31 NOTE — Progress Notes (Signed)
   01/31/15 1436  OBSTRUCTIVE SLEEP APNEA  Have you ever been diagnosed with sleep apnea through a sleep study? No  Do you snore loudly (loud enough to be heard through closed doors)?  1  Do you often feel tired, fatigued, or sleepy during the daytime? 1  Has anyone observed you stop breathing during your sleep? 0  Do you have, or are you being treated for high blood pressure? 1  BMI more than 35 kg/m2? 0  Age over 64 years old? 1  Neck circumference greater than 40 cm/16 inches? 0 (16)  Gender: 1

## 2015-02-01 LAB — HEMOGLOBIN A1C
Hgb A1c MFr Bld: 11.1 % — ABNORMAL HIGH (ref 4.8–5.6)
Mean Plasma Glucose: 272 mg/dL

## 2015-02-02 LAB — URINE CULTURE: Culture: >=100000

## 2015-02-03 NOTE — Progress Notes (Addendum)
Anesthesia Chart Review:  Pt is 64 year old female scheduled for L total knee arthroplasty on 02/12/2015 with Dr. Alain Marion.   Cardiologist is Dr. Mare Ferrari, last office visit 11/12/2014. PCP is Dr. Forde Dandy.   PMH includes: HTN, DM, hyperlipidemia, palpitations (hx PVCs), anemia, GERD. Never smoker. BMI 33. S/p 3 level ACDF 01/01/14.   Medications include: canaglifozin, vytorin, iron, hctz, humulin R, losartan-hctz, prilosec, potassium, demadex.   Preoperative labs reviewed.  Glucose 98, hgbA1c 11.1.   EKG 11/12/2014: Sinus tachycardia. Septal infarct, age undetermined.   According to Dr. Sherryl Barters 03/22/11 note, "She had a normal nuclear stress test using adenosine on 10/26/07 with no ischemia or scar and  normal LV systolic function and an ejection fraction of 74%."  Pt has cardiac clearance from Dr. Mare Ferrari in [place] note dated 11/12/2014.   Dr. Mare Ferrari noted in last office visit note pt was having occasional chest pain relieved by nitro. Sent staff message to clarify pt if pt is ok for surgery.   Willeen Cass, FNP-BC [facility] Short Stay Surgical Center/Anesthesiology Phone: [phone] 02/03/2015 4:34 PM  Addendum:  Nuclear stress test 02/06/2015:  Nuclear stress EF: 70%.  There was no ST segment deviation noted during stress.  The study is normal.  This is a low risk study.  The left ventricular ejection fraction is hyperdynamic (>65%).  no evidence of ischemia.  Dr. Mare Ferrari clears pt for surgery in notes section of stress test results.   If no changes, I anticipate pt can proceed with surgery as scheduled.    Willeen Cass, FNP-BC [facility] Short Stay Surgical Center/Anesthesiology Phone: [phone] 02/07/2015 5:13 PM

## 2015-02-04 ENCOUNTER — Telehealth: Payer: Self-pay | Admitting: Cardiology

## 2015-02-04 DIAGNOSIS — R079 Chest pain, unspecified: Secondary | ICD-10-CM

## 2015-02-04 DIAGNOSIS — Z01818 Encounter for other preprocedural examination: Secondary | ICD-10-CM

## 2015-02-04 NOTE — Telephone Encounter (Signed)
New message      Request for surgical clearance:  What type of surgery is being performed?  Total knee 1. When is this surgery scheduled? 02-12-15  2. Are there any medications that need to be held prior to surgery and how long? no  3. Name of physician performing surgery? Dr Kathryne Hitch  4. What is your office phone and fax number? Fax [phone] 5. Pt had preop exam and was having chest pain and palpitations.  They called our office and said that Dr Mare Ferrari said pt needs a stress test.  They are calling to check the status on scheduling this test and to see if it can be done soon.  Please call and let her know what the status is on scheduling the stress test

## 2015-02-05 ENCOUNTER — Telehealth (HOSPITAL_COMMUNITY): Payer: Self-pay | Admitting: *Deleted

## 2015-02-05 NOTE — Telephone Encounter (Signed)
Left message with Claiborne Billings that have spoke with patient and she is scheduled for lexiscan in am  Cindy in nuclear department reviewed patients instructions with her

## 2015-02-05 NOTE — Telephone Encounter (Signed)
Patient given detailed instructions per Myocardial Perfusion Study Information Sheet for test on 02/06/15 at 0745. Patient Notified to arrive 15 minutes early, and that it is imperative to arrive on time for appointment to keep from having the test rescheduled. Patient verbalized understanding. Baker Kogler, Ranae Palms

## 2015-02-06 ENCOUNTER — Ambulatory Visit (HOSPITAL_COMMUNITY): Payer: Medicare Other | Attending: Internal Medicine

## 2015-02-06 DIAGNOSIS — R079 Chest pain, unspecified: Secondary | ICD-10-CM

## 2015-02-06 DIAGNOSIS — Z01818 Encounter for other preprocedural examination: Secondary | ICD-10-CM | POA: Insufficient documentation

## 2015-02-06 LAB — MYOCARDIAL PERFUSION IMAGING
LV dias vol: 67 mL
LV sys vol: 20 mL
Peak HR: 105 {beats}/min
RATE: 0.28
Rest HR: 90 {beats}/min
SDS: 1
SRS: 0
SSS: 1
TID: 0.94

## 2015-02-06 MED ORDER — TECHNETIUM TC 99M SESTAMIBI GENERIC - CARDIOLITE
10.9000 | Freq: Once | INTRAVENOUS | Status: AC | PRN
  Administered 2015-02-06: 10.9 via INTRAVENOUS

## 2015-02-06 MED ORDER — TECHNETIUM TC 99M SESTAMIBI GENERIC - CARDIOLITE
31.8000 | Freq: Once | INTRAVENOUS | Status: AC | PRN
  Administered 2015-02-06: 31.8 via INTRAVENOUS

## 2015-02-06 MED ORDER — REGADENOSON 0.4 MG/5ML IV SOLN
0.4000 mg | Freq: Once | INTRAVENOUS | Status: AC
  Administered 2015-02-06: 0.4 mg via INTRAVENOUS

## 2015-02-07 ENCOUNTER — Telehealth: Payer: Self-pay | Admitting: *Deleted

## 2015-02-07 NOTE — Telephone Encounter (Signed)
-----   Message from Darlin Coco, MD sent at 02/06/2015  4:44 PM EDT ----- Please report.  The stress test was normal. She is cleared for her orthopedic surgery. Please let her orthopedist know. And let Dr. Forde Dandy know.

## 2015-02-07 NOTE — Telephone Encounter (Signed)
Advised patient of results.  

## 2015-02-11 MED ORDER — CHLORHEXIDINE GLUCONATE 4 % EX LIQD: 60.0000 mL | Freq: Once | CUTANEOUS | Status: DC

## 2015-02-11 MED ORDER — CEFAZOLIN SODIUM-DEXTROSE 2-3 GM-% IV SOLR
2.0000 g | INTRAVENOUS | Status: AC
  Administered 2015-02-12: 2 g via INTRAVENOUS
  Filled 2015-02-11: qty 50

## 2015-02-11 MED ORDER — LACTATED RINGERS IV SOLN: INTRAVENOUS | Status: DC

## 2015-02-12 ENCOUNTER — Inpatient Hospital Stay (HOSPITAL_COMMUNITY): Payer: Medicare Other | Admitting: Emergency Medicine

## 2015-02-12 ENCOUNTER — Inpatient Hospital Stay (HOSPITAL_COMMUNITY): Payer: Medicare Other

## 2015-02-12 ENCOUNTER — Encounter (HOSPITAL_COMMUNITY): Payer: Self-pay | Admitting: *Deleted

## 2015-02-12 ENCOUNTER — Inpatient Hospital Stay (HOSPITAL_COMMUNITY): Payer: Medicare Other | Admitting: Certified Registered"

## 2015-02-12 ENCOUNTER — Encounter (HOSPITAL_COMMUNITY)
Admission: RE | Disposition: A | Discharge status: Home Health | Payer: Self-pay | Source: Ambulatory Visit | Attending: Orthopedic Surgery

## 2015-02-12 ENCOUNTER — Inpatient Hospital Stay (HOSPITAL_COMMUNITY)
Admission: RE | Admit: 2015-02-12 | Discharge: 2015-02-17 | DRG: 470 | Disposition: A | Discharge status: Home Health | Payer: Medicare Other | Source: Ambulatory Visit | Attending: Orthopedic Surgery | Admitting: Orthopedic Surgery

## 2015-02-12 DIAGNOSIS — M25562 Pain in left knee: Secondary | ICD-10-CM | POA: Diagnosis present

## 2015-02-12 DIAGNOSIS — E119 Type 2 diabetes mellitus without complications: Secondary | ICD-10-CM | POA: Diagnosis present

## 2015-02-12 DIAGNOSIS — Z8614 Personal history of Methicillin resistant Staphylococcus aureus infection: Secondary | ICD-10-CM | POA: Diagnosis not present

## 2015-02-12 DIAGNOSIS — Z794 Long term (current) use of insulin: Secondary | ICD-10-CM | POA: Diagnosis not present

## 2015-02-12 DIAGNOSIS — E785 Hyperlipidemia, unspecified: Secondary | ICD-10-CM | POA: Diagnosis present

## 2015-02-12 DIAGNOSIS — M179 Osteoarthritis of knee, unspecified: Secondary | ICD-10-CM | POA: Diagnosis present

## 2015-02-12 DIAGNOSIS — K219 Gastro-esophageal reflux disease without esophagitis: Secondary | ICD-10-CM | POA: Diagnosis present

## 2015-02-12 DIAGNOSIS — M1712 Unilateral primary osteoarthritis, left knee: Secondary | ICD-10-CM | POA: Diagnosis present

## 2015-02-12 DIAGNOSIS — M1710 Unilateral primary osteoarthritis, unspecified knee: Secondary | ICD-10-CM | POA: Diagnosis present

## 2015-02-12 DIAGNOSIS — D62 Acute posthemorrhagic anemia: Secondary | ICD-10-CM | POA: Diagnosis not present

## 2015-02-12 DIAGNOSIS — Z96659 Presence of unspecified artificial knee joint: Secondary | ICD-10-CM

## 2015-02-12 DIAGNOSIS — I10 Essential (primary) hypertension: Secondary | ICD-10-CM | POA: Diagnosis present

## 2015-02-12 HISTORY — PX: TOTAL KNEE ARTHROPLASTY: SHX125

## 2015-02-12 LAB — GLUCOSE, CAPILLARY
Glucose-Capillary: 204 mg/dL — ABNORMAL HIGH (ref 65–99)
Glucose-Capillary: 117 mg/dL — ABNORMAL HIGH (ref 65–99)
Glucose-Capillary: 265 mg/dL — ABNORMAL HIGH (ref 65–99)
Glucose-Capillary: 258 mg/dL — ABNORMAL HIGH (ref 65–99)

## 2015-02-12 SURGERY — ARTHROPLASTY, KNEE, TOTAL
Anesthesia: Spinal | Laterality: Left

## 2015-02-12 MED ORDER — POLYETHYLENE GLYCOL 3350 17 G PO PACK: 17.0000 g | PACK | Freq: Every day | ORAL | Status: DC | PRN

## 2015-02-12 MED ORDER — METHOCARBAMOL 500 MG PO TABS
ORAL_TABLET | ORAL | Status: AC
  Filled 2015-02-12: qty 1

## 2015-02-12 MED ORDER — GLYCOPYRROLATE 0.2 MG/ML IJ SOLN
INTRAMUSCULAR | Status: DC | PRN
  Administered 2015-02-12: 0.1 mg via INTRAVENOUS

## 2015-02-12 MED ORDER — BUPIVACAINE HCL (PF) 0.5 % IJ SOLN
INTRAMUSCULAR | Status: AC
  Filled 2015-02-12: qty 10

## 2015-02-12 MED ORDER — POTASSIUM CHLORIDE CRYS ER 20 MEQ PO TBCR
20.0000 meq | EXTENDED_RELEASE_TABLET | Freq: Two times a day (BID) | ORAL | Status: DC
  Administered 2015-02-12 – 2015-02-17 (×10): 20 meq via ORAL
  Filled 2015-02-12 (×10): qty 1

## 2015-02-12 MED ORDER — POTASSIUM CHLORIDE IN NACL 20-0.9 MEQ/L-% IV SOLN
INTRAVENOUS | Status: DC
  Administered 2015-02-12 – 2015-02-14 (×3): via INTRAVENOUS
  Filled 2015-02-12 (×3): qty 1000

## 2015-02-12 MED ORDER — MIDAZOLAM HCL 2 MG/2ML IJ SOLN
INTRAMUSCULAR | Status: AC
  Filled 2015-02-12: qty 4

## 2015-02-12 MED ORDER — HYDROMORPHONE HCL 1 MG/ML IJ SOLN
0.5000 mg | INTRAMUSCULAR | Status: DC | PRN
  Administered 2015-02-12 – 2015-02-17 (×15): 1 mg via INTRAVENOUS
  Filled 2015-02-12 (×15): qty 1

## 2015-02-12 MED ORDER — OXYCODONE HCL 5 MG PO TABS
5.0000 mg | ORAL_TABLET | ORAL | Status: DC | PRN
  Administered 2015-02-12: 5 mg via ORAL
  Administered 2015-02-12 – 2015-02-17 (×20): 10 mg via ORAL
  Filled 2015-02-12 (×20): qty 2

## 2015-02-12 MED ORDER — INSULIN ASPART 100 UNIT/ML ~~LOC~~ SOLN
0.0000 [IU] | Freq: Three times a day (TID) | SUBCUTANEOUS | Status: DC
  Administered 2015-02-12: 2 [IU] via SUBCUTANEOUS
  Administered 2015-02-13: 3 [IU] via SUBCUTANEOUS
  Administered 2015-02-13: 7 [IU] via SUBCUTANEOUS
  Administered 2015-02-13: 5 [IU] via SUBCUTANEOUS
  Administered 2015-02-14: 3 [IU] via SUBCUTANEOUS
  Administered 2015-02-14: 2 [IU] via SUBCUTANEOUS
  Administered 2015-02-14: 3 [IU] via SUBCUTANEOUS
  Administered 2015-02-15: 2 [IU] via SUBCUTANEOUS
  Administered 2015-02-15: 1 [IU] via SUBCUTANEOUS
  Administered 2015-02-15 – 2015-02-16 (×4): 3 [IU] via SUBCUTANEOUS
  Administered 2015-02-17: 5 [IU] via SUBCUTANEOUS
  Administered 2015-02-17: 3 [IU] via SUBCUTANEOUS

## 2015-02-12 MED ORDER — APIXABAN 2.5 MG PO TABS
2.5000 mg | ORAL_TABLET | Freq: Two times a day (BID) | ORAL | Status: DC
Start: 2015-02-13 — End: 2015-02-17
  Administered 2015-02-13 – 2015-02-17 (×9): 2.5 mg via ORAL
  Filled 2015-02-12 (×9): qty 1

## 2015-02-12 MED ORDER — ONDANSETRON HCL 4 MG PO TABS: 4.0000 mg | ORAL_TABLET | Freq: Three times a day (TID) | ORAL | Status: DC | PRN

## 2015-02-12 MED ORDER — DOCUSATE SODIUM 100 MG PO CAPS
100.0000 mg | ORAL_CAPSULE | Freq: Two times a day (BID) | ORAL | Status: DC
  Administered 2015-02-12 – 2015-02-17 (×10): 100 mg via ORAL
  Filled 2015-02-12 (×10): qty 1

## 2015-02-12 MED ORDER — BISACODYL 5 MG PO TBEC: 5.0000 mg | DELAYED_RELEASE_TABLET | Freq: Every day | ORAL | Status: DC | PRN

## 2015-02-12 MED ORDER — ACETAMINOPHEN 325 MG PO TABS
650.0000 mg | ORAL_TABLET | Freq: Four times a day (QID) | ORAL | Status: DC | PRN
  Administered 2015-02-13 – 2015-02-14 (×2): 650 mg via ORAL
  Filled 2015-02-12: qty 2

## 2015-02-12 MED ORDER — KETAMINE HCL 10 MG/ML IJ SOLN
INTRAMUSCULAR | Status: DC | PRN
  Administered 2015-02-12: 20 mg via INTRAVENOUS

## 2015-02-12 MED ORDER — STERILE WATER FOR INJECTION IJ SOLN
INTRAMUSCULAR | Status: AC
  Filled 2015-02-12: qty 10

## 2015-02-12 MED ORDER — CANAGLIFLOZIN 300 MG PO TABS
150.0000 mg | ORAL_TABLET | Freq: Every day | ORAL | Status: DC
  Administered 2015-02-17: 150 mg via ORAL
  Filled 2015-02-12 (×5): qty 150

## 2015-02-12 MED ORDER — APIXABAN 2.5 MG PO TABS: ORAL_TABLET | ORAL | Status: DC

## 2015-02-12 MED ORDER — EPHEDRINE SULFATE 50 MG/ML IJ SOLN
INTRAMUSCULAR | Status: DC | PRN
  Administered 2015-02-12: 10 mg via INTRAVENOUS

## 2015-02-12 MED ORDER — KETAMINE HCL 100 MG/ML IJ SOLN
INTRAMUSCULAR | Status: AC
  Filled 2015-02-12: qty 1

## 2015-02-12 MED ORDER — BUPIVACAINE LIPOSOME 1.3 % IJ SUSP
20.0000 mL | INTRAMUSCULAR | Status: AC
  Administered 2015-02-12: 20 mL
  Filled 2015-02-12: qty 20

## 2015-02-12 MED ORDER — PROPOFOL 10 MG/ML IV BOLUS
INTRAVENOUS | Status: DC | PRN
  Administered 2015-02-12 (×2): 20 mg via INTRAVENOUS

## 2015-02-12 MED ORDER — DIPHENHYDRAMINE HCL 12.5 MG/5ML PO ELIX
12.5000 mg | ORAL_SOLUTION | ORAL | Status: DC | PRN
  Administered 2015-02-14: 25 mg via ORAL
  Filled 2015-02-12: qty 10

## 2015-02-12 MED ORDER — ONDANSETRON HCL 4 MG PO TABS: 4.0000 mg | ORAL_TABLET | Freq: Four times a day (QID) | ORAL | Status: DC | PRN

## 2015-02-12 MED ORDER — PROPOFOL 10 MG/ML IV BOLUS
INTRAVENOUS | Status: AC
  Filled 2015-02-12: qty 20

## 2015-02-12 MED ORDER — PHENYLEPHRINE HCL 10 MG/ML IJ SOLN
INTRAMUSCULAR | Status: DC | PRN
  Administered 2015-02-12: 120 ug via INTRAVENOUS
  Administered 2015-02-12 (×2): 100 ug via INTRAVENOUS

## 2015-02-12 MED ORDER — NITROGLYCERIN 0.4 MG SL SUBL: 0.4000 mg | SUBLINGUAL_TABLET | SUBLINGUAL | Status: DC | PRN

## 2015-02-12 MED ORDER — METHOCARBAMOL 500 MG PO TABS
500.0000 mg | ORAL_TABLET | Freq: Four times a day (QID) | ORAL | Status: DC | PRN
  Administered 2015-02-12 – 2015-02-17 (×10): 500 mg via ORAL
  Filled 2015-02-12 (×10): qty 1

## 2015-02-12 MED ORDER — HYDROCHLOROTHIAZIDE 12.5 MG PO CAPS
25.0000 mg | ORAL_CAPSULE | Freq: Every day | ORAL | Status: DC
  Administered 2015-02-13 – 2015-02-17 (×5): 25 mg via ORAL
  Filled 2015-02-12 (×5): qty 2

## 2015-02-12 MED ORDER — EZETIMIBE-SIMVASTATIN 10-40 MG PO TABS
1.0000 | ORAL_TABLET | Freq: Every day | ORAL | Status: DC
  Administered 2015-02-12 – 2015-02-16 (×5): 1 via ORAL
  Filled 2015-02-12 (×9): qty 1

## 2015-02-12 MED ORDER — MAGNESIUM CITRATE PO SOLN: 1.0000 | Freq: Once | ORAL | Status: DC | PRN

## 2015-02-12 MED ORDER — METHOCARBAMOL 1000 MG/10ML IJ SOLN
500.0000 mg | Freq: Four times a day (QID) | INTRAVENOUS | Status: DC | PRN
  Filled 2015-02-12: qty 5

## 2015-02-12 MED ORDER — AZELASTINE HCL 0.15 % NA SOLN: 1.0000 | Freq: Two times a day (BID) | NASAL | Status: DC

## 2015-02-12 MED ORDER — LIDOCAINE HCL (CARDIAC) 20 MG/ML IV SOLN
INTRAVENOUS | Status: AC
  Filled 2015-02-12: qty 5

## 2015-02-12 MED ORDER — METHOCARBAMOL 500 MG PO TABS: 500.0000 mg | ORAL_TABLET | Freq: Four times a day (QID) | ORAL | Status: DC

## 2015-02-12 MED ORDER — MENTHOL 3 MG MT LOZG
1.0000 | LOZENGE | OROMUCOSAL | Status: DC | PRN
  Filled 2015-02-12: qty 9

## 2015-02-12 MED ORDER — OXYCODONE-ACETAMINOPHEN 5-325 MG PO TABS: 1.0000 | ORAL_TABLET | ORAL | Status: DC | PRN

## 2015-02-12 MED ORDER — LACTATED RINGERS IV SOLN
INTRAVENOUS | Status: DC | PRN
  Administered 2015-02-12 (×2): via INTRAVENOUS

## 2015-02-12 MED ORDER — METOCLOPRAMIDE HCL 5 MG/ML IJ SOLN: 5.0000 mg | Freq: Three times a day (TID) | INTRAMUSCULAR | Status: DC | PRN

## 2015-02-12 MED ORDER — ACETAMINOPHEN 650 MG RE SUPP: 650.0000 mg | Freq: Four times a day (QID) | RECTAL | Status: DC | PRN

## 2015-02-12 MED ORDER — LOSARTAN POTASSIUM 50 MG PO TABS
50.0000 mg | ORAL_TABLET | Freq: Every day | ORAL | Status: DC
  Administered 2015-02-13 – 2015-02-17 (×5): 50 mg via ORAL
  Filled 2015-02-12 (×5): qty 1

## 2015-02-12 MED ORDER — PHENOL 1.4 % MT LIQD: 1.0000 | OROMUCOSAL | Status: DC | PRN

## 2015-02-12 MED ORDER — PROPOFOL INFUSION 10 MG/ML OPTIME
INTRAVENOUS | Status: DC | PRN
  Administered 2015-02-12: 50 ug/kg/min via INTRAVENOUS

## 2015-02-12 MED ORDER — PHENYLEPHRINE HCL 10 MG/ML IJ SOLN
20.0000 mg | INTRAVENOUS | Status: DC | PRN
  Administered 2015-02-12: 20 ug/min via INTRAVENOUS

## 2015-02-12 MED ORDER — CEFAZOLIN SODIUM 1-5 GM-% IV SOLN
1.0000 g | Freq: Four times a day (QID) | INTRAVENOUS | Status: AC
  Administered 2015-02-12 (×2): 1 g via INTRAVENOUS
  Filled 2015-02-12 (×2): qty 50

## 2015-02-12 MED ORDER — PANTOPRAZOLE SODIUM 40 MG PO TBEC: 40.0000 mg | DELAYED_RELEASE_TABLET | Freq: Every day | ORAL | Status: DC

## 2015-02-12 MED ORDER — PHENYLEPHRINE 40 MCG/ML (10ML) SYRINGE FOR IV PUSH (FOR BLOOD PRESSURE SUPPORT)
PREFILLED_SYRINGE | INTRAVENOUS | Status: AC
  Filled 2015-02-12: qty 10

## 2015-02-12 MED ORDER — ALPRAZOLAM 0.25 MG PO TABS
0.2500 mg | ORAL_TABLET | Freq: Every evening | ORAL | Status: DC | PRN
  Administered 2015-02-15 – 2015-02-16 (×3): 0.25 mg via ORAL
  Filled 2015-02-12 (×3): qty 1

## 2015-02-12 MED ORDER — SODIUM CHLORIDE 0.9 % IJ SOLN
INTRAMUSCULAR | Status: DC | PRN
  Administered 2015-02-12: 10 mL

## 2015-02-12 MED ORDER — TORSEMIDE 20 MG PO TABS: 20.0000 mg | ORAL_TABLET | Freq: Every day | ORAL | Status: DC | PRN

## 2015-02-12 MED ORDER — OXYCODONE HCL 5 MG PO TABS
ORAL_TABLET | ORAL | Status: AC
  Filled 2015-02-12: qty 1

## 2015-02-12 MED ORDER — ONDANSETRON HCL 4 MG/2ML IJ SOLN
INTRAMUSCULAR | Status: AC
  Filled 2015-02-12: qty 2

## 2015-02-12 MED ORDER — FENTANYL CITRATE (PF) 250 MCG/5ML IJ SOLN
INTRAMUSCULAR | Status: AC
  Filled 2015-02-12: qty 5

## 2015-02-12 MED ORDER — BUPIVACAINE HCL 0.5 % IJ SOLN
INTRAMUSCULAR | Status: DC | PRN
Start: 2015-02-12 — End: 2015-02-12
  Administered 2015-02-12: 10 mL

## 2015-02-12 MED ORDER — LOSARTAN POTASSIUM-HCTZ 50-12.5 MG PO TABS: 1.0000 | ORAL_TABLET | Freq: Every day | ORAL | Status: DC

## 2015-02-12 MED ORDER — SUCCINYLCHOLINE CHLORIDE 20 MG/ML IJ SOLN
INTRAMUSCULAR | Status: AC
  Filled 2015-02-12: qty 1

## 2015-02-12 MED ORDER — SODIUM CHLORIDE 0.9 % IR SOLN
Status: DC | PRN
  Administered 2015-02-12: 3000 mL

## 2015-02-12 MED ORDER — HYDROCHLOROTHIAZIDE 12.5 MG PO CAPS
12.5000 mg | ORAL_CAPSULE | Freq: Every day | ORAL | Status: DC
  Administered 2015-02-13 – 2015-02-17 (×5): 12.5 mg via ORAL
  Filled 2015-02-12 (×5): qty 1

## 2015-02-12 MED ORDER — ONDANSETRON HCL 4 MG/2ML IJ SOLN: 4.0000 mg | Freq: Four times a day (QID) | INTRAMUSCULAR | Status: DC | PRN

## 2015-02-12 MED ORDER — MIDAZOLAM HCL 5 MG/5ML IJ SOLN
INTRAMUSCULAR | Status: DC | PRN
  Administered 2015-02-12: 2 mg via INTRAVENOUS

## 2015-02-12 MED ORDER — METOCLOPRAMIDE HCL 5 MG PO TABS: 5.0000 mg | ORAL_TABLET | Freq: Three times a day (TID) | ORAL | Status: DC | PRN

## 2015-02-12 MED ORDER — FERROUS GLUCONATE 324 (38 FE) MG PO TABS
324.0000 mg | ORAL_TABLET | Freq: Every day | ORAL | Status: DC
  Administered 2015-02-13 – 2015-02-17 (×5): 324 mg via ORAL
  Filled 2015-02-12 (×7): qty 1

## 2015-02-12 SURGICAL SUPPLY — 69 items
BANDAGE ELASTIC 4 VELCRO ST LF (GAUZE/BANDAGES/DRESSINGS) ×2 IMPLANT
BANDAGE ELASTIC 6 VELCRO ST LF (GAUZE/BANDAGES/DRESSINGS) ×2 IMPLANT
BANDAGE ESMARK 6X9 LF (GAUZE/BANDAGES/DRESSINGS) ×1 IMPLANT
BENZOIN TINCTURE PRP APPL 2/3 (GAUZE/BANDAGES/DRESSINGS) ×2 IMPLANT
BLADE SAG 18X100X1.27 (BLADE) ×4 IMPLANT
BNDG ESMARK 6X9 LF (GAUZE/BANDAGES/DRESSINGS) ×2
BOWL SMART MIX CTS (DISPOSABLE) ×2 IMPLANT
CEMENT BONE SIMPLEX SPEEDSET (Cement) ×4 IMPLANT
COVER SURGICAL LIGHT HANDLE (MISCELLANEOUS) ×2 IMPLANT
CUFF TOURNIQUET SINGLE 34IN LL (TOURNIQUET CUFF) ×2 IMPLANT
CUSTOM TITANIUM KNEE VANGUARD (Capitated) ×2 IMPLANT
DRAPE EXTREMITY T 121X128X90 (DRAPE) ×2 IMPLANT
DRAPE IMP U-DRAPE 54X76 (DRAPES) ×2 IMPLANT
DRAPE PROXIMA HALF (DRAPES) ×2 IMPLANT
DRAPE U-SHAPE 47X51 STRL (DRAPES) ×2 IMPLANT
DRSG PAD ABDOMINAL 8X10 ST (GAUZE/BANDAGES/DRESSINGS) ×2 IMPLANT
DURAPREP 26ML APPLICATOR (WOUND CARE) ×4 IMPLANT
ELECT CAUTERY BLADE 6.4 (BLADE) ×2 IMPLANT
ELECT REM PT RETURN 9FT ADLT (ELECTROSURGICAL) ×2
ELECTRODE REM PT RTRN 9FT ADLT (ELECTROSURGICAL) ×1 IMPLANT
EVACUATOR 1/8 PVC DRAIN (DRAIN) ×2 IMPLANT
FACESHIELD WRAPAROUND (MASK) ×4 IMPLANT
GAUZE SPONGE 4X4 12PLY STRL (GAUZE/BANDAGES/DRESSINGS) ×2 IMPLANT
GLOVE BIOGEL PI IND STRL 7.0 (GLOVE) ×1 IMPLANT
GLOVE BIOGEL PI INDICATOR 7.0 (GLOVE) ×1
GLOVE ECLIPSE 7.5 STRL STRAW (GLOVE) ×2 IMPLANT
GLOVE ORTHO TXT STRL SZ7.5 (GLOVE) ×4 IMPLANT
GLOVE SURG ORTHO 7.0 STRL STRW (GLOVE) ×2 IMPLANT
GLOVE SURG ORTHO 8.0 STRL STRW (GLOVE) ×2 IMPLANT
GOWN STRL REUS W/ TWL LRG LVL3 (GOWN DISPOSABLE) ×2 IMPLANT
GOWN STRL REUS W/ TWL XL LVL3 (GOWN DISPOSABLE) ×1 IMPLANT
GOWN STRL REUS W/TWL LRG LVL3 (GOWN DISPOSABLE) ×2
GOWN STRL REUS W/TWL XL LVL3 (GOWN DISPOSABLE) ×1
HANDPIECE INTERPULSE COAX TIP (DISPOSABLE) ×1
IMMOBILIZER KNEE 22 (SOFTGOODS) ×2 IMPLANT
IMMOBILIZER KNEE 22 UNIV (SOFTGOODS) ×2 IMPLANT
IMMOBILIZER KNEE 24 THIGH 36 (MISCELLANEOUS) IMPLANT
IMMOBILIZER KNEE 24 UNIV (MISCELLANEOUS)
KIT BASIN OR (CUSTOM PROCEDURE TRAY) ×2 IMPLANT
KIT ROOM TURNOVER OR (KITS) ×2 IMPLANT
MANIFOLD NEPTUNE II (INSTRUMENTS) ×2 IMPLANT
NEEDLE 18GX1X1/2 (RX/OR ONLY) (NEEDLE) ×2 IMPLANT
NEEDLE HYPO 25GX1X1/2 BEV (NEEDLE) ×2 IMPLANT
NS IRRIG 1000ML POUR BTL (IV SOLUTION) ×2 IMPLANT
PACK TOTAL JOINT (CUSTOM PROCEDURE TRAY) ×2 IMPLANT
PACK UNIVERSAL I (CUSTOM PROCEDURE TRAY) ×2 IMPLANT
PAD ABD 8X10 STRL (GAUZE/BANDAGES/DRESSINGS) ×2 IMPLANT
PAD ARMBOARD 7.5X6 YLW CONV (MISCELLANEOUS) ×4 IMPLANT
PAD CAST 4YDX4 CTTN HI CHSV (CAST SUPPLIES) ×1 IMPLANT
PADDING CAST ABS 4INX4YD NS (CAST SUPPLIES) ×1
PADDING CAST ABS COTTON 4X4 ST (CAST SUPPLIES) ×1 IMPLANT
PADDING CAST COTTON 4X4 STRL (CAST SUPPLIES) ×1
SET HNDPC FAN SPRY TIP SCT (DISPOSABLE) ×1 IMPLANT
SPONGE GAUZE 4X4 12PLY STER LF (GAUZE/BANDAGES/DRESSINGS) ×2 IMPLANT
STRIP CLOSURE SKIN 1/2X4 (GAUZE/BANDAGES/DRESSINGS) ×4 IMPLANT
SUCTION FRAZIER TIP 10 FR DISP (SUCTIONS) ×2 IMPLANT
SUT MNCRL AB 4-0 PS2 18 (SUTURE) ×2 IMPLANT
SUT VIC AB 0 CT1 27 (SUTURE)
SUT VIC AB 0 CT1 27XBRD ANBCTR (SUTURE) IMPLANT
SUT VIC AB 1 CTX 36 (SUTURE) ×1
SUT VIC AB 1 CTX36XBRD ANBCTR (SUTURE) ×1 IMPLANT
SUT VIC AB 2-0 CT1 27 (SUTURE) ×2
SUT VIC AB 2-0 CT1 TAPERPNT 27 (SUTURE) ×2 IMPLANT
SYR 50ML LL SCALE MARK (SYRINGE) ×2 IMPLANT
SYR CONTROL 10ML LL (SYRINGE) ×2 IMPLANT
TAPE STRIPS DRAPE STRL (GAUZE/BANDAGES/DRESSINGS) ×2 IMPLANT
TOWEL OR 17X24 6PK STRL BLUE (TOWEL DISPOSABLE) ×2 IMPLANT
TOWEL OR 17X26 10 PK STRL BLUE (TOWEL DISPOSABLE) ×2 IMPLANT
WATER STERILE IRR 1000ML POUR (IV SOLUTION) ×2 IMPLANT

## 2015-02-12 NOTE — Transfer of Care (Signed)
Immediate Anesthesia Transfer of Care Note  Patient: Belinda [name]  Procedure(s) Performed: Procedure(s): TOTAL KNEE ARTHROPLASTY (Left)  Patient Location: PACU  Anesthesia Type:Spinal  Level of Consciousness: awake, alert  and oriented  Airway & Oxygen Therapy: Patient Spontanous Breathing and Patient connected to nasal cannula oxygen  Post-op Assessment: Report given to RN  Post vital signs: Reviewed and stable  Last Vitals:  Filed Vitals:   02/12/15 0735  BP: 148/76  Pulse: 100  Temp: 36.3 C  Resp: 20    Complications: No apparent anesthesia complications

## 2015-02-12 NOTE — Progress Notes (Signed)
Orthopedic Tech Progress Note Patient Details:  Belinda [name] Jul 19, 1950 [id number]  CPM Left Knee CPM Left Knee: On (Simultaneous filing. User may not have seen previous data.) Left Knee Flexion (Degrees): 90 (Simultaneous filing. User may not have seen previous data.) Left Knee Extension (Degrees): 0 (Simultaneous filing. User may not have seen previous data.) Additional Comments: trapeze bar patient helper Viewed order from doctor's order list  Hildred Priest 02/12/2015, 11:39 AM

## 2015-02-12 NOTE — Anesthesia Preprocedure Evaluation (Addendum)
Anesthesia Evaluation  Patient identified by MRN, date of birth, ID band Patient awake    Reviewed: Allergy & Precautions, NPO status   History of Anesthesia Complications Negative for: history of anesthetic complications  Airway Mallampati: II  TM Distance: <3 FB Neck ROM: Limited    Dental  (+) Teeth Intact, Dental Advisory Given   Pulmonary shortness of breath,  breath sounds clear to auscultation        Cardiovascular hypertension, + dysrhythmias Rhythm:Regular Rate:Normal  Cardiac studies are noted   Neuro/Psych    GI/Hepatic GERD-  ,  Endo/Other  diabetes, Poorly ControlledMorbid obesity  Renal/GU      Musculoskeletal  (+) Arthritis -,   Abdominal (+) + obese,   Peds  Hematology  (+) anemia ,   Anesthesia Other Findings   Reproductive/Obstetrics                           Anesthesia Physical Anesthesia Plan  ASA: II  Anesthesia Plan: Spinal   Post-op Pain Management:    Induction: Intravenous  Airway Management Planned: Natural Airway and Simple Face Mask  Additional Equipment:   Intra-op Plan:   Post-operative Plan:   Informed Consent: I have reviewed the patients History and Physical, chart, labs and discussed the procedure including the risks, benefits and alternatives for the proposed anesthesia with the patient or authorized representative who has indicated his/her understanding and acceptance.   Dental advisory given  Plan Discussed with: CRNA and Surgeon  Anesthesia Plan Comments:        Anesthesia Quick Evaluation

## 2015-02-12 NOTE — Progress Notes (Signed)
Utilization review completed.  

## 2015-02-12 NOTE — Anesthesia Postprocedure Evaluation (Signed)
  Anesthesia Post-op Note  Patient: Belinda [name]  Procedure(s) Performed: Procedure(s): TOTAL KNEE ARTHROPLASTY (Left)  Patient Location: PACU  Anesthesia Type:Spinal  Level of Consciousness: awake, alert , oriented and patient cooperative  Airway and Oxygen Therapy: Patient Spontanous Breathing and Patient connected to nasal cannula oxygen  Post-op Pain: none  Post-op Assessment: Post-op Vital signs reviewed, Patient's Cardiovascular Status Stable, Respiratory Function Stable, Patent Airway, No signs of Nausea or vomiting, Pain level controlled, No headache, No backache and Spinal receding LLE Motor Response: Other (Comment) (able to slightly wiggle toes) LLE Sensation: Decreased, Tingling, Other (Comment) (feels pressure) RLE Motor Response: Other (Comment) (able to slighly bend knee off of bed, wiggles toes) RLE Sensation: Tingling, Other (Comment) (feels pressure) L Sensory Level: S1-Sole of foot, small toes R Sensory Level: S1-Sole of foot, small toes  Post-op Vital Signs: Reviewed and stable  Last Vitals:  Filed Vitals:   02/12/15 1412  BP: 122/57  Pulse: 105  Temp:   Resp: 18    Complications: No apparent anesthesia complications

## 2015-02-12 NOTE — Evaluation (Signed)
Physical Therapy Evaluation Patient Details Name: Belinda [name] MRN: [id number] DOB: [birth year] Today's Date: 02/12/2015   History of Present Illness  Pt is a 64 y/o F s/p Lt TKA.  Pt's PMH includes HTN, back pain, anemia, anxiety, migraines, dysrhythmia, DM.  Clinical Impression  Pt is s/p Lt TKA resulting in the deficits listed below (see PT Problem List). Belinda [name] demonstrated ability to ambulate 5 ft and perform stand pivot to [facility] this session.  She will be returning home upon d/c as she will have 24/7 assist from her husband at home. Pt will benefit from skilled PT to increase their independence and safety with mobility to allow discharge to the venue listed below.     Follow Up Recommendations Home health PT;Supervision/Assistance - 24 hour    Equipment Recommendations  3in1 (PT)    Recommendations for Other Services OT consult     Precautions / Restrictions Precautions Precautions: Knee;Fall Precaution Booklet Issued: Yes (comment) Precaution Comments: Reviewed knee precautions Restrictions Weight Bearing Restrictions: Yes LLE Weight Bearing: Weight bearing as tolerated      Mobility  Bed Mobility Overal bed mobility: Needs Assistance Bed Mobility: Rolling;Sidelying to Sit Rolling: Modified independent (Device/Increase time) Sidelying to sit: Min assist;HOB elevated       General bed mobility comments: Min assist managing Lt LE and providing VCs for proper sequencing for technique.  Use of bed rails.  Transfers Overall transfer level: Needs assistance Equipment used: Rolling walker (2 wheeled) Transfers: Sit to/from [facility] Sit to Stand: Min assist Stand pivot transfers: Min guard       General transfer comment: Min assist to power up to standing and cues for proper use of RW.  Min guard assist for stand pivot and cues for positioning in front of BSC.  Ambulation/Gait Ambulation/Gait assistance: Min guard Ambulation Distance  (Feet): 5 Feet Assistive device: Rolling walker (2 wheeled) Gait Pattern/deviations: Step-to pattern;Antalgic;Trunk flexed;Decreased weight shift to left;Decreased stride length;Decreased stance time - left   Gait velocity interpretation: Below normal speed for age/gender General Gait Details: Trunk flexed which pt says is her baseline 2/2 chronic LBP.    Stairs            Wheelchair Mobility    Modified Rankin (Stroke Patients Only)       Balance Overall balance assessment: Needs assistance Sitting-balance support: No upper extremity supported;Feet supported Sitting balance-Leahy Scale: Fair Sitting balance - Comments: Min guard sitting EOB    Standing balance support: During functional activity;Bilateral upper extremity supported Standing balance-Leahy Scale: Poor Standing balance comment: Relies on RW for support                             Pertinent Vitals/Pain Pain Assessment: 0-10 Pain Score: 10-Worst pain ever Pain Location: Lt knee Pain Descriptors / Indicators: Constant;Jabbing Pain Intervention(s): Limited activity within patient's tolerance;Monitored during session;Repositioned;Patient requesting pain meds-RN notified    Home Living Family/patient expects to be discharged to:: Private residence Living Arrangements: Spouse/significant other Available Help at Discharge: Available 24 hours/day;Family (husband) Type of Home: House Home Access: Stairs to enter Entrance Stairs-Rails: None   Home Layout: One level Home Equipment: Cane - single point;Walker - 2 wheels      Prior Function Level of Independence: Independent with assistive device(s)         Comments: PTA used cane when out in community and prn in home     Hand Dominance  Extremity/Trunk Assessment   Upper Extremity Assessment: Defer to OT evaluation           Lower Extremity Assessment: LLE deficits/detail   LLE Deficits / Details: weakness and limited ROM  s/p Lt TKA     Communication   Communication: No difficulties  Cognition Arousal/Alertness: Awake/alert Behavior During Therapy: WFL for tasks assessed/performed Overall Cognitive Status: Within Functional Limits for tasks assessed                      General Comments      Exercises Total Joint Exercises Ankle Circles/Pumps: AROM;Both;15 reps;Supine Quad Sets: Strengthening;Both;10 reps;Supine Short Arc Quad: Strengthening;Left;5 reps;Supine Heel Slides: Strengthening;Left;5 reps;Supine Straight Leg Raises: Strengthening;Left;5 reps;Supine      Assessment/Plan    PT Assessment Patient needs continued PT services  PT Diagnosis Difficulty walking;Abnormality of gait;Generalized weakness;Acute pain   PT Problem List Decreased strength;Decreased range of motion;Decreased activity tolerance;Decreased balance;Decreased mobility;Decreased knowledge of use of DME;Decreased safety awareness;Decreased knowledge of precautions;Impaired sensation;Decreased skin integrity;Pain  PT Treatment Interventions DME instruction;Gait training;Stair training;Functional mobility training;Therapeutic activities;Therapeutic exercise;Balance training;Neuromuscular re-education;Patient/family education;Modalities   PT Goals (Current goals can be found in the Care Plan section) Acute Rehab PT Goals Patient Stated Goal: to get stronger and go home PT Goal Formulation: With patient Time For Goal Achievement: 02/19/15 Potential to Achieve Goals: Good    Frequency 7X/week   Barriers to discharge Inaccessible home environment 1 step to enter home    Co-evaluation               End of Session Equipment Utilized During Treatment: Gait belt Activity Tolerance: Patient limited by pain Patient left: in chair;with call bell/phone within reach;with nursing/sitter in room Nurse Communication: Mobility status;Precautions;Weight bearing status;Patient requests pain meds         Time:  [id number] PT Time Calculation (min) (ACUTE ONLY): 37 min   Charges:   PT Evaluation $Initial PT Evaluation Tier I: 1 Procedure PT Treatments $Therapeutic Exercise: 8-22 mins   PT G Codes:       Joslyn Hy PT, DPT [phone] Pager: [phone] 02/12/2015, 4:01 PM

## 2015-02-12 NOTE — Interval H&P Note (Signed)
History and Physical Interval Note:  02/12/2015 9:54 AM  Belinda [name]  has presented today for surgery, with the diagnosis of djd left knee  The various methods of treatment have been discussed with the patient and family. After consideration of risks, benefits and other options for treatment, the patient has consented to  Procedure(s): TOTAL KNEE ARTHROPLASTY (Left) as a surgical intervention .  The patient's history has been reviewed, patient examined, no change in status, stable for surgery.  I have reviewed the patient's chart and labs.  Questions were answered to the patient's satisfaction.     [name]

## 2015-02-12 NOTE — Discharge Summary (Addendum)
Patient ID: Belinda [name] MRN: [id number] DOB/AGE: 12/29/1950 64 y.o.  Admit date: 02/12/2015 Discharge date: 02/14/2015  Admission Diagnoses:  Active Problems:   DJD (degenerative joint disease) of knee   Discharge Diagnoses:  Same  Past Medical History  Diagnosis Date  . Hypertension   . Hyperlipidemia   . Back pain     intractable low back  . Palpitations   . Anemia     Hx. of  . Hx MRSA infection   . Osteoarthritis   . Anxiety   . GERD (gastroesophageal reflux disease)   . Migraines   . Dysrhythmia     palpitations or heart racing periodic. has had for years- Dr Mare Ferrari follows.  . 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 female who was admitted 02/12/2015 for operative treatment of primary localized osteoarthritis left knee. Patient has severe unremitting pain that affects sleep, daily activities, and work/hobbies. After pre-op clearance the patient was taken to the operating room on 02/12/2015 and underwent  Procedure(s): TOTAL KNEE ARTHROPLASTY.  Patient with a  Pre-op Hb of 11.3 developed ABLA on pod #1 with a Hb of 8.8.  Pod #2 Hb dropped to 7.9.  Patient is very lethargic so 2 units prbc ordered.  We will continue to follow.  Patient was given perioperative antibiotics:      Anti-infectives    Start     Dose/Rate Route Frequency Ordered Stop   02/12/15 1500  ceFAZolin (ANCEF) IVPB 1 g/50 mL premix     1 g 100 mL/hr over 30 Minutes Intravenous Every 6 hours 02/12/15 1225 02/12/15 2256   02/12/15 0800  ceFAZolin (ANCEF) IVPB 2 g/50 mL premix     2 g 100 mL/hr over 30 Minutes Intravenous To [facility]Stay [facility] 02/11/15 1045 02/12/15 0847       Patient was given sequential compression devices, early ambulation, and chemoprophylaxis to prevent DVT.  Patient  benefited maximally from hospital stay and there were no complications.    Recent vital signs:  Patient Vitals for the past 24 hrs:  BP Temp Pulse Resp SpO2  02/14/15 0600 (!) 141/84 mmHg 98.9 F (37.2 C) (!) 115 18 100 %  02/13/15 2021 134/71 mmHg 99.3 F (37.4 C) (!) 119 18 95 %  02/13/15 1300 (!) 155/83 mmHg 98.5 F (36.9 C) (!) 125 18 93 %     Recent laboratory studies:   Recent Labs  02/13/15 0612 02/14/15 0345  WBC 10.0 12.0*  HGB 8.8* 7.9*  HCT 26.9* 24.1*  PLT 205 195  NA 139 134*  K 4.7 4.2  CL 107 100*  CO2 25 23  BUN 16 13  CREATININE 1.21* 1.07*  GLUCOSE 276* 227*  CALCIUM 8.7* 8.5*     Discharge Medications:     Medication List    STOP taking these medications        ADVIL MIGRAINE 200 MG Caps  Generic drug:  Ibuprofen     ALEVE 220 MG Caps  Generic drug:  Naproxen Sodium     COD LIVER OIL PO     Evening Primrose Oil 1000 MG Caps     Flaxseed Oil 1200 MG Caps     TYLENOL ARTHRITIS PAIN 650 MG CR tablet  Generic drug:  acetaminophen      TAKE  these medications        ALAWAY OP  Place 2 drops into both eyes as needed (for dry eyes).     ALIVE WOMENS ENERGY PO  Take 1 tablet by mouth daily.     ALPRAZolam 0.25 MG tablet  Commonly known as:  XANAX  Take 0.25 mg by mouth at bedtime as needed for anxiety.     apixaban 2.5 MG Tabs tablet  Commonly known as:  ELIQUIS  Take 1 tab po q12 hours x 14 days following surgery to prevent blood clots     Azelastine HCl 0.15 % Soln  Place 1 spray into both nostrils 2 (two) times daily.     B-D INS SYR HALF-UNIT .3CC/31G 31G X 5/16" 0.3 ML Misc  Generic drug:  Insulin Syringe-Needle U-100  1 each by Other route See admin instructions. Use as directed 2 times daily with insulin     bisacodyl 5 MG EC tablet  Commonly known as:  DULCOLAX  Take 1 tablet (5 mg total) by mouth daily as needed for moderate constipation.     ezetimibe-simvastatin 10-40 MG per tablet  Commonly known as:  VYTORIN   Take 1 tablet by mouth daily.     ferrous gluconate 324 MG tablet  Commonly known as:  FERGON  Take 324 mg by mouth daily with breakfast.     fexofenadine 180 MG tablet  Commonly known as:  ALLEGRA  Take 180 mg by mouth daily.     guaiFENesin 600 MG 12 hr tablet  Commonly known as:  MUCINEX  Take 600 mg by mouth daily.     hydrochlorothiazide 12.5 MG capsule  Commonly known as:  MICROZIDE  2 tablets once a day     insulin regular human CONCENTRATED 500 UNIT/ML injection  Commonly known as:  HUMULIN R  Inject 6-14 Units into the skin See admin instructions. Inject 14 units subque at lunch and 6 units subque at bedtime     INVOKANA 300 MG Tabs tablet  Generic drug:  canagliflozin  Take 150 mg by mouth daily.     losartan-hydrochlorothiazide 50-12.5 MG per tablet  Commonly known as:  HYZAAR  Take 1 tablet by mouth at bedtime.     methocarbamol 500 MG tablet  Commonly known as:  ROBAXIN  Take 1 tablet (500 mg total) by mouth 4 (four) times daily.     NASONEX 50 MCG/ACT nasal spray  Generic drug:  mometasone  Place 2 sprays into the nose at bedtime as needed (for allergies).     nitroGLYCERIN 0.4 MG SL tablet  Commonly known as:  NITROSTAT  Place 0.4 mg under the tongue every 5 (five) minutes as needed for chest pain.     omeprazole 20 MG capsule  Commonly known as:  PRILOSEC  Take 20 mg by mouth at bedtime as needed (for acid reflux).     ondansetron 4 MG tablet  Commonly known as:  ZOFRAN  Take 1 tablet (4 mg total) by mouth every 8 (eight) hours as needed for nausea or vomiting.     ONE TOUCH ULTRA TEST test strip  Generic drug:  glucose blood  1 each by Other route See admin instructions. Check blood sugar 3 times daily     OVER THE COUNTER MEDICATION  Take 1 tablet by mouth 2 (two) times daily. Menorelief  tabs     OVER THE COUNTER MEDICATION  Take 1 tablet by mouth 2 (two) times daily. isoflabones 40 mg for hot flashes  OVER THE COUNTER MEDICATION   Take 1 tablet by mouth 2 (two) times daily. [place] -- Triple Joint Relief     oxyCODONE-acetaminophen 5-325 MG per tablet  Commonly known as:  ROXICET  Take 1-2 tablets by mouth every 4 (four) hours as needed.     potassium chloride 10 MEQ tablet  Commonly known as:  K-DUR,KLOR-CON  Take 2 tablets (20 mEq total) by mouth 2 (two) times daily.     torsemide 20 MG tablet  Commonly known as:  DEMADEX  Take 1 tablet (20 mg total) by mouth daily as needed (for fluid).     vitamin C 1000 MG tablet  Take 1,000 mg by mouth at bedtime.     Vitamin D-3 5000 UNITS Tabs  Take 5,000 Units by mouth daily.     vitamin E 400 UNIT capsule  Take 400 Units by mouth daily.        Diagnostic Studies: Dg Knee Left Port  02/12/2015   CLINICAL DATA:  Status post total knee replacement  EXAM: PORTABLE LEFT KNEE - 1-2 VIEW  COMPARISON:  May 17, 2006  FINDINGS: Frontal and lateral views obtained. Patient is status post total knee replacement with femoral and tibial prosthetic components appearing well-seated. No acute fracture or dislocation. A drain is seen within the knee joint. Soft tissue air is an expected postoperative finding.  IMPRESSION: Prosthetic components appear well seated. No acute fracture or dislocation.   Electronically Signed   By: Lowella Grip III M.D.   On: 02/12/2015 11:37    Disposition: 06-Home-Health Care Svc    Follow-up Information    Schedule an appointment as soon as possible for a visit with [name], MD.   Specialty:  Orthopedic Surgery   Contact information:   [address] [place] [address] [phone]       Follow up with [facility].   Why:  Someone from [facility] will contact you concerning start date and time for therapy.   Contact information:   [address] [place] [address] [phone]        Signed: Fannie Knee 02/14/2015, 8:03 AM

## 2015-02-12 NOTE — H&P (View-Only) (Signed)
  This is a 64 year-old female who presents to our clinic today with continued bilateral knee pain, left greater than right.  Belinda [name] has a history of end stage degenerative joint disease, which has progressively worsened.  She is having rest and night pain, which are both becoming intolerable.  She is having to use a cane with ambulation.  She has tried Corticosteroid and Visco supplementation injections.  She comes in today ready to proceed with bilateral sequential total knee replacements, left greater than right.   Past medical, social and family history reviewed in detail on the patient questionnaire and signed.  Review of systems: As detailed in HPI.  All others reviewed and are negative.   EXAMINATION: Well-developed, well-nourished female in no acute distress.  Alert and oriented x 3.  Examination of both knees reveal trace effusion.  Range of motion 0-110 degrees.  Right knee varus deformity, partially correctable.  Medial joint line tenderness.  Negative log roll.  Positive straight leg raise.  Left knee reveals lateral joint line tenderness.   Valgus deformity.  Negative log roll.  Positive straight leg raise.  She is neurovascularly intact distally.    X-RAYS: X-rays of her left knee reveal bone on bone in the lateral and patellofemoral compartments.  Near 50% joint space narrowing medially.    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   PLAN: At this point in time it is evident that the only thing that will help Belinda [name] is a total knee replacement.  We are going to proceed with the left knee first.  Risks, benefits and possible complications of surgery have been reviewed.  Rehab and recovery time discussed. All questions were answered.  Paperwork complete.  Belinda [name] does have a nickel allergy so we will most likely be using a Biomet system.  She will need clearance from her primary care physician, Dr. Forde Dandy, as well as Dr. Mare Ferrari, cardiologist, prior to operative  intervention.  We will see Belinda [name] just prior to operative intervention for a history and physical.    [name], M.D.

## 2015-02-13 ENCOUNTER — Encounter (HOSPITAL_COMMUNITY): Payer: Self-pay | Admitting: Orthopedic Surgery

## 2015-02-13 LAB — BASIC METABOLIC PANEL
Anion gap: 7 (ref 5–15)
BUN: 16 mg/dL (ref 6–20)
CO2: 25 mmol/L (ref 22–32)
Calcium: 8.7 mg/dL — ABNORMAL LOW (ref 8.9–10.3)
Chloride: 107 mmol/L (ref 101–111)
Creatinine, Ser: 1.21 mg/dL — ABNORMAL HIGH (ref 0.44–1.00)
GFR calc Af Amer: 54 mL/min — ABNORMAL LOW (ref >60)
GFR calc non Af Amer: 47 mL/min — ABNORMAL LOW (ref >60)
Glucose, Bld: 276 mg/dL — ABNORMAL HIGH (ref 65–99)
Potassium: 4.7 mmol/L (ref 3.5–5.1)
Sodium: 139 mmol/L (ref 135–145)

## 2015-02-13 LAB — GLUCOSE, CAPILLARY
Glucose-Capillary: 237 mg/dL — ABNORMAL HIGH (ref 65–99)
Glucose-Capillary: 285 mg/dL — ABNORMAL HIGH (ref 65–99)
Glucose-Capillary: 307 mg/dL — ABNORMAL HIGH (ref 65–99)
Glucose-Capillary: 289 mg/dL — ABNORMAL HIGH (ref 65–99)

## 2015-02-13 LAB — CBC
HCT: 26.9 % — ABNORMAL LOW (ref 36.0–46.0)
Hemoglobin: 8.8 g/dL — ABNORMAL LOW (ref 12.0–15.0)
MCH: 25.6 pg — ABNORMAL LOW (ref 26.0–34.0)
MCHC: 32.7 g/dL (ref 30.0–36.0)
MCV: 78.2 fL (ref 78.0–100.0)
Platelets: 205 10*3/uL (ref 150–400)
RBC: 3.44 MIL/uL — ABNORMAL LOW (ref 3.87–5.11)
RDW: 15.7 % — ABNORMAL HIGH (ref 11.5–15.5)
WBC: 10 10*3/uL (ref 4.0–10.5)

## 2015-02-13 MED ORDER — INSULIN GLARGINE 100 UNIT/ML ~~LOC~~ SOLN
25.0000 [IU] | Freq: Every day | SUBCUTANEOUS | Status: DC
  Administered 2015-02-13 – 2015-02-16 (×4): 25 [IU] via SUBCUTANEOUS
  Filled 2015-02-13 (×5): qty 0.25

## 2015-02-13 MED ORDER — INSULIN ASPART 100 UNIT/ML ~~LOC~~ SOLN
5.0000 [IU] | Freq: Three times a day (TID) | SUBCUTANEOUS | Status: DC
  Administered 2015-02-14 – 2015-02-17 (×10): 5 [IU] via SUBCUTANEOUS

## 2015-02-13 MED ORDER — OXYCODONE HCL ER 10 MG PO T12A
10.0000 mg | EXTENDED_RELEASE_TABLET | Freq: Two times a day (BID) | ORAL | Status: DC
  Administered 2015-02-13 – 2015-02-17 (×9): 10 mg via ORAL
  Filled 2015-02-13 (×9): qty 1

## 2015-02-13 NOTE — Progress Notes (Signed)
Subjective: 1 Day Post-Op Procedure(s) (LRB): TOTAL KNEE ARTHROPLASTY (Left) Patient reports pain as moderate.  Patient reported some lightheadedness yesterday when sitting up in bed.  None as of yet today.  No chest pain/sob, nausea or vomiting. Negative flatus/bm.  Decreased appetite.  Objective: Vital signs in last 24 hours: Temp:  [96.9 F (36.1 C)-99.1 F (37.3 C)] 98.9 F (37.2 C) (08/18 0512) Pulse Rate:  [86-114] 114 (08/18 0512) Resp:  [12-20] 18 (08/18 0512) BP: (88-148)/(46-83) 144/83 mmHg (08/18 0512) SpO2:  [90 %-100 %] 90 % (08/18 0512) Weight:  [74.9 kg (165 lb 2 oz)] 74.9 kg (165 lb 2 oz) (08/17 0711)  Intake/Output from previous day: 08/17 0701 - 08/18 0700 In: 3163 [P.O.:240; I.V.:2873; IV Piggyback:50] Out: 630 [Drains:630] Intake/Output this shift: Total I/O In: -  Out: 130 [Drains:130]  No results for input(s): HGB in the last 72 hours. No results for input(s): WBC, RBC, HCT, PLT in the last 72 hours. No results for input(s): NA, K, CL, CO2, BUN, CREATININE, GLUCOSE, CALCIUM in the last 72 hours. No results for input(s): LABPT, INR in the last 72 hours.  Neurologically intact Neurovascular intact Sensation intact distally Intact pulses distally Dorsiflexion/Plantar flexion intact Compartment soft  No drainage noted through dressing Negative homans bilaterally  Assessment/Plan: 1 Day Post-Op Procedure(s) (LRB): TOTAL KNEE ARTHROPLASTY (Left) Advance diet Up with therapy Plan for discharge tomorrow to home with hhpt WBAT LLE Dry dressing change prn hemovac drain pulled by me today Will order oxycontin to help with pain control   Fannie Knee 02/13/2015, 6:42 AM

## 2015-02-13 NOTE — Care Management Note (Signed)
Case Management Note  Patient Details  Name: Belinda [name] MRN: [id number] Date of Birth: 1950-08-17  Subjective/Objective:    64 yr old female s/p left total knee arthroplasty.               Action/Plan:   Patient was preoperatively setup with [place], no changes. Will have family support at discharge.    Expected Discharge Date:    02/14/15              Expected Discharge Plan:   Home with Home Health  In-House Referral:  NA  Discharge planning Services  CM Consult  Post Acute Care Choice:  Durable Medical Equipment Choice offered to:     DME Arranged:  3-N-1, CPM, Walker rolling DME Agency:  [employer]  HH Arranged:  PT [place] Agency:  [facility]  Status of Service:  Completed, signed off  Medicare Important Message Given:    Date Medicare IM Given:    Medicare IM give by:    Date Additional Medicare IM Given:    Additional Medicare Important Message give by:     If discussed at [place] of Stay Meetings, dates discussed:    Additional Comments:  Ninfa Meeker, RN 02/13/2015, 2:44 PM

## 2015-02-13 NOTE — Op Note (Signed)
NAMEYARESLY, KIMMELMAN              ACCOUNT NO.:  [id number]  MEDICAL RECORD NO.:  [id number]  LOCATION:  [id number]                        FACILITY:  [facility]  PHYSICIAN:  [name], M.D. DATE OF BIRTH:  [birth year]  DATE OF PROCEDURE:  02/12/2015 DATE OF DISCHARGE:                              OPERATIVE REPORT   PREOPERATIVE DIAGNOSES:  Primary generalized arthritis left knee, marked erosive changes lateral compartment with more than 15 degrees of valgus as result of bone loss, lateral compartment.  Extensive erosive hypertrophic changes of patella.  POSTOPERATIVE DIAGNOSES:  Primary generalized arthritis left knee, marked erosive changes lateral compartment with more than 15 degrees of valgus as result of bone loss, lateral compartment.  Extensive erosive hypertrophic changes of patella.  PROCEDURE:  Left knee modified minimally invasive total knee replacement utilizing Biomet titanium prosthesis.  Soft tissue balancing.  A cemented posterior stabilized 62.5 femoral component.  Cemented 71 tibial component, 10 mm polyethylene insert.  Cemented resurfacing 34 mm patellar component.  SURGEON:  [name], M.D.  ASSISTANT:  Elmyra Ricks, PA. present throughout the entire case and necessary for timely completion of procedure.  ANESTHESIA:  Spinal.  BLOOD LOSS:  Minimal.  SPECIMENS:  None.  CULTURES:  None.  COMPLICATIONS:  None.  DRESSINGS:  Soft compressive knee immobilizer.  DRAINS:  Hemovac x1.  TOURNIQUET TIME:  55 minutes.  DESCRIPTION OF PROCEDURE:  The patient was brought to the operating room, placed on the operating table in supine position.  After adequate anesthesia had been obtained, tourniquet applied.  Prepped and draped in usual sterile fashion.  Exsanguinated with elevation of Esmarch. Tourniquet inflated to 350 mmHg.  More than 15 degrees of valgus correctable.  Anterior approach vastus splitting preserving quad tendon. Medial capsule  release.  Intramedullary guide distal femur.  9 mm resection mostly off the medial side, 5 degrees of valgus.  Using epicondylar axis, the femur was sized, cut, and fitted for a posterior stabilized 62.5 femoral component.  Extramedullary guide proximal tibia. Resection 2 mm below the defect laterally.  Sized to #71 component.  A 10 mm PS insert.  Rotation set with trials and the tibia hand reamed. Patella exposed.  Exuberant spurs removed, taken down to a flat surface. Drilled, sized, and fitted for a 30-40 mm component.  All trials removed.  Copious irrigation with pulse irrigating device.  Cement prepared, placed on all components, firmly seated.  Polyethylene attached to tibia, knee reduced.  Patella held with a clamp.  Once cement hardened, the knee was examined.  Excellent by mechanical axis. Nicely balanced in flexion and extension.  Good stability.  Good patellar tracking.  Soft tissues injected with Exparel.  Hemovac placed and brought out through a separate stab wound.  Arthrotomy closed with Ethibond subcutaneous subcuticular closure.  Margins were injected with Marcaine.  Sterile compressive dressing applied.  Tourniquet deflated and removed.  Knee immobilizer applied.  Anesthesia reversed.  Brought to the recovery room.  Tolerated the surgery well with no complications.     [name], M.D.     DFM/MEDQ  D:  02/12/2015  T:  02/13/2015  Job:  [id number]

## 2015-02-13 NOTE — Plan of Care (Signed)
Problem: Consults Goal: Diagnosis- Total Joint Replacement Outcome: Completed/Met Date Met:  02/13/15 Primary Total Knee left

## 2015-02-13 NOTE — Progress Notes (Signed)
Inpatient Diabetes Program Recommendations  AACE/ADA: New Consensus Statement on Inpatient Glycemic Control (2013)  Target Ranges:  Prepandial:   less than 140 mg/dL      Peak postprandial:   less than 180 mg/dL (1-2 hours)      Critically ill patients:  140 - 180 mg/dL   Review of Glycemic Control:  Results for GWINDA, TROTMAN (MRN [id number]) as of 02/13/2015 14:08  Ref. Range 02/12/2015 10:42 02/12/2015 15:28 02/12/2015 21:58 02/13/2015 06:18 02/13/2015 11:34  Glucose-Capillary Latest Ref Range: 65-99 mg/dL 117 (H) 265 (H) 258 (H) 237 (H) 285 (H)    Diabetes history: Diabetes Mellitus Outpatient Diabetes medications: U500 insulin- 14 units (equivalent to 70 units of U100 insulin) with lunch and 6 units (equivalent of 30 units of U100 insulin) q HS Current orders for Inpatient glycemic control: Invokana 150 mg daily, Novolog sensitive tid with meals   Please consider adding Lantus 25 units q HS while in the hospital.  Also consider adding Novolog 5 units tid with meals (hold if patient eats less than 50%) while in the hospital.  Patient can resume home insulin after d/c per PCP.  Needs follow-up regarding elevated A1C.  Thanks, Adah Perl, RN, BC-ADM Inpatient Diabetes Coordinator Pager [phone] (8a-5p)

## 2015-02-13 NOTE — Progress Notes (Signed)
Physical Therapy Treatment Patient Details Name: Belinda [name] MRN: [id number] DOB: [birth year] Today's Date: 02/13/2015    History of Present Illness Pt is a 64 y/o F s/p Lt TKA.  Pt's PMH includes HTN, back pain, anemia, anxiety, migraines, dysrhythmia, DM.    PT Comments    Patient overall moving very slowly and guarded. O2 dropped to 83 on RA with ambulation. Return to 92 with one minute rest break, reapplied her 1L of O2 at end of session. Will continue to work on increasing gait speed and distance. Patient appears to be fearful with movement, holding her back from optimal mobility at this time  Follow Up Recommendations  Home health PT;Supervision/Assistance - 24 hour     Equipment Recommendations  3in1 (PT)    Recommendations for Other Services       Precautions / Restrictions Precautions Precautions: Knee;Fall Precaution Comments: Reviewed knee precautions Restrictions LLE Weight Bearing: Weight bearing as tolerated    Mobility  Bed Mobility               General bed mobility comments: UP in recilner before and after session  Transfers Overall transfer level: Needs assistance Equipment used: Rolling walker (2 wheeled)   Sit to Stand: Min assist         General transfer comment: Min A to power up and for stability. Cues for hand placement  Ambulation/Gait Ambulation/Gait assistance: Min guard Ambulation Distance (Feet): 30 Feet Assistive device: Rolling walker (2 wheeled) Gait Pattern/deviations: Step-to pattern;Decreased step length - right;Decreased step length - left;Decreased stance time - left Gait velocity: very decreased Gait velocity interpretation: Below normal speed for age/gender General Gait Details: Trunk flexed which pt says is her baseline 2/2 chronic LBP.  Cues for posture and to increase gait speed as able   Stairs            Wheelchair Mobility    Modified Rankin (Stroke Patients Only)       Balance                                     Cognition Arousal/Alertness: Awake/alert Behavior During Therapy: WFL for tasks assessed/performed Overall Cognitive Status: Within Functional Limits for tasks assessed                      Exercises Total Joint Exercises Quad Sets: Strengthening;Both;10 reps;Seated Heel Slides: Left;10 reps;AAROM Hip ABduction/ADduction: AAROM;Left;10 reps Straight Leg Raises: AAROM;Left;5 reps Long Arc Quad: AAROM;Left;10 reps    General Comments        Pertinent Vitals/Pain Pain Score: 7  Pain Location: L knee Pain Descriptors / Indicators: Aching;Sore Pain Intervention(s): Monitored during session    Home Living                      Prior Function            PT Goals (current goals can now be found in the care plan section) Progress towards PT goals: Progressing toward goals    Frequency  7X/week    PT Plan Current plan remains appropriate    Co-evaluation             End of Session Equipment Utilized During Treatment: Gait belt;Oxygen Activity Tolerance: Patient limited by pain Patient left: in chair;with call bell/phone within reach     Time: 1350-1420 PT Time Calculation (min) (ACUTE ONLY): 30 min  Charges:  $Gait Training: 8-22 mins $Therapeutic Exercise: 8-22 mins                    G Codes:      Jacqualyn Posey 02/13/2015, 2:56 PM 02/13/2015 Jacqualyn Posey PTA [phone] pager [phone] office

## 2015-02-13 NOTE — Progress Notes (Signed)
PT Treatment Note    02/13/15 1048  PT Visit Information  Last PT Received On 02/13/15  Assistance Needed +1  History of Present Illness Pt is a 64 y/o F s/p Lt TKA.  Pt's PMH includes HTN, back pain, anemia, anxiety, migraines, dysrhythmia, DM.  PT Time Calculation  PT Start Time (ACUTE ONLY) 1021  PT Stop Time (ACUTE ONLY) 1100  PT Time Calculation (min) (ACUTE ONLY) 39 min  Subjective Data  Subjective Pt has felt lethargic from her pain medicine  Patient Stated Goal to get stronger and go home  Precautions  Precautions Knee;Fall  Precaution Booklet Issued Yes (comment)  Precaution Comments Reviewed knee precautions  Restrictions  Weight Bearing Restrictions Yes  LLE Weight Bearing WBAT  Pain Assessment  Pain Assessment 0-10  Pain Score 8  Pain Location Lt knee  Pain Descriptors / Indicators Aching;Jabbing;Grimacing;Guarding  Pain Intervention(s) Limited activity within patient's tolerance;Monitored during session;Repositioned  Cognition  Arousal/Alertness Awake/alert  Behavior During Therapy WFL for tasks assessed/performed  Overall Cognitive Status Within Functional Limits for tasks assessed  Bed Mobility  General bed mobility comments Up in recliner upon PT arrival  Transfers  Overall transfer level Needs assistance  Equipment used Rolling walker (2 wheeled)  Transfers Sit to/from Stand  Sit to Stand Min assist  General transfer comment Min assist to power up to standing.  Cues to scoot toward edge of seat prior to standing.  Ambulation/Gait  Ambulation/Gait assistance Min assist  Ambulation Distance (Feet) 30 Feet  Assistive device Rolling walker (2 wheeled)  Gait Pattern/deviations Step-to pattern;Antalgic;Trunk flexed;Decreased weight shift to left;Decreased stride length;Decreased stance time - left  General Gait Details Trunk flexed which pt says is her baseline 2/2 chronic LBP.  Cues for proper breathing technique as pt's SpO2 drops to 81% on RA, [place] reapplied  at 1LPM w/ SpO2 returned to above 94% for the remainder of the session.  Gait velocity very decreased  Gait velocity interpretation Below normal speed for age/gender  Balance  Overall balance assessment Needs assistance  Sitting-balance support No upper extremity supported;Feet supported  Sitting balance-Leahy Scale Good  Standing balance support Bilateral upper extremity supported;During functional activity  Standing balance-Leahy Scale Poor  Standing balance comment Relies on RW for support  Exercises  Exercises Total Joint  Total Joint Exercises  Ankle Circles/Pumps AROM;Both;15 reps;Seated  Quad Sets Strengthening;Both;10 reps;Seated  Short Arc Quad Left;5 reps;AAROM;Strengthening;AROM;Seated  Knee Flexion AROM;Left;5 reps;Seated  Goniometric ROM 8-82  PT - End of Session  Equipment Utilized During Treatment Gait belt;Oxygen  Activity Tolerance Patient limited by pain  Patient left in chair;with call bell/phone within reach  Nurse Communication Mobility status;Other (comment) (SpO2 levels on RA and on O2)  PT - Assessment/Plan  PT Plan Equipment recommendations need to be updated  PT Frequency (ACUTE ONLY) 7X/week  Follow Up Recommendations Home health PT;Supervision/Assistance - 24 hour  PT equipment 3in1 (PT);Rolling walker with 5" wheels (Pediatric RW)  PT Goal Progression  Progress towards PT goals Progressing toward goals  Acute Rehab PT Goals  PT Goal Formulation With patient  Time For Goal Achievement 02/19/15  Potential to Achieve Goals Good  PT General Charges  $$ ACUTE PT VISIT 1 Procedure  PT Treatments  $Gait Training 8-22 mins  $Therapeutic Exercise 23-37 mins   Assessment: Belinda [name] is making slow progress w/ mobility, limited by pain in Lt knee and pre-existing LBP.  Pt will benefit from continued skilled PT services to increase functional independence and safety.  Joslyn Hy PT,  DPT [id number] Pager: [phone]

## 2015-02-13 NOTE — Progress Notes (Signed)
Orthopedic Tech Progress Note Patient Details:  Belinda [name] Jul 09, 1950 [id number] On cpm at 8:05 pm Patient ID: [name], female   DOB: [birth year], 64 y.o.   MRN: [id number]   Braulio Bosch 02/13/2015, 8:07 PM

## 2015-02-13 NOTE — Evaluation (Signed)
Occupational Therapy Evaluation Patient Details Name: KAMBERLYN LECHLER MRN: [id number] DOB: February 26, 1951 Today's Date: 02/13/2015    History of Present Illness Pt is a 64 y/o F s/p Lt TKA.  Pt's PMH includes HTN, back pain, anemia, anxiety, migraines, dysrhythmia, DM.   Clinical Impression   Patient presenting with decreased ADL, IADL, functional mobility independence and takes increased time to complete tasks. Patient independent > mod I PTA. Patient currently requires up to mod assist for LB ADLs, min assist for functional mobility, set-up for UB ADLs. Patient will benefit from acute OT to increase overall independence in the areas of ADLs, functional mobility, and overall safety in order to safely discharge home with husband and HHOT.     Follow Up Recommendations  Home health OT;Supervision/Assistance - 24 hour    Equipment Recommendations  3 in 1 bedside comode    Recommendations for Other Services  None at this time   Precautions / Restrictions Precautions Precautions: Knee;Fall Precaution Comments: Reviewed knee precautions, no pillow under knee and zero knee foam Restrictions Weight Bearing Restrictions: Yes LLE Weight Bearing: Weight bearing as tolerated    Mobility Bed Mobility Overal bed mobility: Needs Assistance Bed Mobility: Rolling;Sidelying to Sit Rolling: Min guard Sidelying to sit: Min assist;HOB elevated       General bed mobility comments: Use of bed rails. Assistance needed for management of LLE. Cues for technique and sequencing during transfer  Transfers Overall transfer level: Needs assistance Equipment used: Rolling walker (2 wheeled) Transfers: Sit to/from Stand Sit to Stand: Mod assist General transfer comment: Mod assist to lift/lower to power up/down. Cues for hand placement, technique, sequencing.     Balance Overall balance assessment: Needs assistance Sitting-balance support: No upper extremity supported;Feet supported Sitting  balance-Leahy Scale: Fair     Standing balance support: Bilateral upper extremity supported;During functional activity Standing balance-Leahy Scale: Poor    ADL Overall ADL's : Needs assistance/impaired General ADL Comments: Pt requires assistance for LB ADLs secondary to recent TKA and h/o low back pain. Educated pt on use of AE and plan to introduce equipment during next OT session. Pt will benefit from acute OT and recommending HHOT due to decreased overall independence. Donned KI for mobility during this session due to weakness throughout LLE. Pt ambulated <> BR using RW with min guard assist. Pt takes more than a reasonable amount of time.     Pertinent Vitals/Pain Pain Assessment: 0-10 Pain Score: 7  Pain Location: Left knee Pain Descriptors / Indicators: Aching;Sore Pain Intervention(s): Monitored during session;Repositioned;RN gave pain meds during session     Hand Dominance Left   Extremity/Trunk Assessment Upper Extremity Assessment Upper Extremity Assessment: Generalized weakness (Pt reports cervical surgery last year with residual weakness )   Lower Extremity Assessment Lower Extremity Assessment: Defer to PT evaluation   Cervical / Trunk Assessment Cervical / Trunk Assessment: Kyphotic   Communication Communication Communication: No difficulties   Cognition Arousal/Alertness: Awake/alert Behavior During Therapy: WFL for tasks assessed/performed Overall Cognitive Status: Within Functional Limits for tasks assessed              Home Living Family/patient expects to be discharged to:: Private residence Living Arrangements: Spouse/significant other Available Help at Discharge: Available 24 hours/day;Family (husband) Type of Home: House Home Access: Stairs to enter [employer] of Steps: 1 Entrance Stairs-Rails: None Home Layout: One level     Bathroom Shower/Tub: Walk-in shower;Door   Bathroom Toilet: Handicapped height     Home Equipment:  [place] - single point;Walker -  2 wheels;Shower seat - built in;Shower seat   Prior Functioning/Environment Level of Independence: Independent with assistive device(s)  Comments: PTA used cane when out in community and prn in home    OT Diagnosis: Generalized weakness;Acute pain   OT Problem List: Decreased strength;Decreased range of motion;Decreased activity tolerance;Impaired balance (sitting and/or standing);Decreased safety awareness;Pain;Decreased knowledge of use of DME or AE;Decreased knowledge of precautions   OT Treatment/Interventions: Self-care/ADL training;Energy conservation;DME and/or AE instruction;Therapeutic activities;Patient/family education;Balance training;Therapeutic exercise    OT Goals(Current goals can be found in the care plan section) Acute Rehab OT Goals Patient Stated Goal: to get stronger and go home OT Goal Formulation: With patient Time For Goal Achievement: 02/27/15 Potential to Achieve Goals: Good ADL Goals Pt Will Perform Grooming: with modified independence;standing Pt Will Perform Lower Body Bathing: with modified independence;sit to/from stand;with adaptive equipment Pt Will Perform Lower Body Dressing: sit to/from stand;with adaptive equipment;with modified independence Pt Will Transfer to Toilet: with modified independence;ambulating;bedside commode Pt Will Perform Tub/Shower Transfer: Shower transfer;shower seat;rolling walker;with modified independence;ambulating Additional ADL Goal #1: Pt will independently verbalize and adhere to knee precautions 100% of the time  OT Frequency: Min 2X/week   Barriers to D/C: None known at this time   End of Session Equipment Utilized During Treatment: Gait belt;Rolling walker;Left knee immobilizer CPM Left Knee CPM Left Knee: Off  Activity Tolerance: Patient tolerated treatment well Patient left: in chair;with call bell/phone within reach   Time: 0818-0859 OT Time Calculation (min): 41 min Charges:  OT  General Charges $OT Visit: 1 Procedure OT Evaluation $Initial OT Evaluation Tier I: 1 Procedure OT Treatments $Self Care/Home Management : 23-37 mins  Flynn Lininger , MS, OTR/L, CLT Pager: [phone]  02/13/2015, 9:04 AM

## 2015-02-14 LAB — CBC
HCT: 24.1 % — ABNORMAL LOW (ref 36.0–46.0)
HCT: 30.4 % — ABNORMAL LOW (ref 36.0–46.0)
Hemoglobin: 7.9 g/dL — ABNORMAL LOW (ref 12.0–15.0)
Hemoglobin: 10.6 g/dL — ABNORMAL LOW (ref 12.0–15.0)
MCH: 25.4 pg — ABNORMAL LOW (ref 26.0–34.0)
MCH: 27.3 pg (ref 26.0–34.0)
MCHC: 32.8 g/dL (ref 30.0–36.0)
MCHC: 34.9 g/dL (ref 30.0–36.0)
MCV: 77.5 fL — ABNORMAL LOW (ref 78.0–100.0)
MCV: 78.4 fL (ref 78.0–100.0)
Platelets: 195 10*3/uL (ref 150–400)
Platelets: 157 10*3/uL (ref 150–400)
RBC: 3.11 MIL/uL — ABNORMAL LOW (ref 3.87–5.11)
RBC: 3.88 MIL/uL (ref 3.87–5.11)
RDW: 15.8 % — ABNORMAL HIGH (ref 11.5–15.5)
RDW: 15.9 % — ABNORMAL HIGH (ref 11.5–15.5)
WBC: 12 10*3/uL — ABNORMAL HIGH (ref 4.0–10.5)
WBC: 12.8 10*3/uL — ABNORMAL HIGH (ref 4.0–10.5)

## 2015-02-14 LAB — BASIC METABOLIC PANEL
Anion gap: 11 (ref 5–15)
BUN: 13 mg/dL (ref 6–20)
CO2: 23 mmol/L (ref 22–32)
Calcium: 8.5 mg/dL — ABNORMAL LOW (ref 8.9–10.3)
Chloride: 100 mmol/L — ABNORMAL LOW (ref 101–111)
Creatinine, Ser: 1.07 mg/dL — ABNORMAL HIGH (ref 0.44–1.00)
GFR calc Af Amer: >60 mL/min (ref >60)
GFR calc non Af Amer: 54 mL/min — ABNORMAL LOW (ref >60)
Glucose, Bld: 227 mg/dL — ABNORMAL HIGH (ref 65–99)
Potassium: 4.2 mmol/L (ref 3.5–5.1)
Sodium: 134 mmol/L — ABNORMAL LOW (ref 135–145)

## 2015-02-14 LAB — PREPARE RBC (CROSSMATCH)

## 2015-02-14 LAB — GLUCOSE, CAPILLARY
Glucose-Capillary: 203 mg/dL — ABNORMAL HIGH (ref 65–99)
Glucose-Capillary: 198 mg/dL — ABNORMAL HIGH (ref 65–99)
Glucose-Capillary: 238 mg/dL — ABNORMAL HIGH (ref 65–99)
Glucose-Capillary: 158 mg/dL — ABNORMAL HIGH (ref 65–99)

## 2015-02-14 MED ORDER — FUROSEMIDE 10 MG/ML IJ SOLN
20.0000 mg | Freq: Once | INTRAMUSCULAR | Status: AC
  Administered 2015-02-14: 20 mg via INTRAVENOUS
  Filled 2015-02-14: qty 2

## 2015-02-14 MED ORDER — SODIUM CHLORIDE 0.9 % IV SOLN
Freq: Once | INTRAVENOUS | Status: AC
  Administered 2015-02-14: 10:00:00 via INTRAVENOUS

## 2015-02-14 NOTE — Progress Notes (Signed)
Physical Therapy Treatment Patient Details Name: Belinda [name] MRN: [id number] DOB: [birth year] Today's Date: 02/14/2015    History of Present Illness Pt is a 64 y/o F s/p Lt TKA.  Pt's PMH includes HTN, back pain, anemia, anxiety, migraines, dysrhythmia, DM.    PT Comments    Patient was seen this afternoon and performed HEP for ROM and strengthening. Patient receiving blood at this time and stated she felt weak therefore did not attempt ambulation. Will follow up tomorrow with increasing ambulation in plans for DC home.   Follow Up Recommendations  Home health PT;Supervision/Assistance - 24 hour     Equipment Recommendations  3in1 (PT)    Recommendations for Other Services       Precautions / Restrictions Precautions Precautions: Knee;Fall Precaution Comments: Reviewed knee precautions Restrictions LLE Weight Bearing: Weight bearing as tolerated    Mobility  Bed Mobility                  Transfers                    Ambulation/Gait                 Stairs            Wheelchair Mobility    Modified Rankin (Stroke Patients Only)       Balance                                    Cognition Arousal/Alertness: Awake/alert Behavior During Therapy: WFL for tasks assessed/performed Overall Cognitive Status: Within Functional Limits for tasks assessed                      Exercises Total Joint Exercises Quad Sets: Strengthening;Both;Seated;15 reps Short Arc Quad: Left;AAROM;Seated;15 reps Heel Slides: AAROM;Left;15 reps Hip ABduction/ADduction: AAROM;Left;15 reps Straight Leg Raises: AAROM;Left;15 reps    General Comments        Pertinent Vitals/Pain Pain Score: 6  Pain Location: Lknee Pain Descriptors / Indicators: Aching;Sore Pain Intervention(s): Monitored during session    Home Living                      Prior Function            PT Goals (current goals can now be found in the  care plan section) Progress towards PT goals: Progressing toward goals    Frequency  7X/week    PT Plan Current plan remains appropriate    Co-evaluation             End of Session   Activity Tolerance: Patient tolerated treatment well Patient left: in chair;with call bell/phone within reach     Time: 1254-1313 PT Time Calculation (min) (ACUTE ONLY): 19 min  Charges:  $Therapeutic Exercise: 8-22 mins                    G Codes:      Jacqualyn Posey 02/14/2015, 1:52 PM  02/14/2015 Jacqualyn Posey PTA [phone] pager [phone] office

## 2015-02-14 NOTE — Progress Notes (Signed)
Occupational Therapy Treatment Patient Details Name: Belinda [name] MRN: [id number] DOB: 1951-06-28 Today's Date: 02/14/2015    History of present illness Pt is a 64 y/o F s/p Lt TKA.  Pt's PMH includes HTN, back pain, anemia, anxiety, migraines, dysrhythmia, DM.   OT comments  Pt seen for OT intervention this session. Pt seated in recliner chair and receiving blood transfusion upon entering the room. Pt reports feeling very weak and declines functional transfers and mobility this session. OT provided education regarding LB AE for increased I with self care. Pt returned demonstration for simulated practice. Pt also requiring assist to don L TED compression garment this session. Pt remained in recliner chair at end of session.    Follow Up Recommendations  Home health OT;Supervision/Assistance - 24 hour    Equipment Recommendations  3 in 1 bedside comode    Recommendations for Other Services      Precautions / Restrictions Precautions Precautions: Knee;Fall Precaution Comments: Reviewed knee precautions Restrictions Weight Bearing Restrictions: Yes LLE Weight Bearing: Weight bearing as tolerated              ADL Overall ADL's : Needs assistance/impaired                                       General ADL Comments: Pt with h/o low back pain and weakness in B hands from previous neck surgery per pt. She currently requires assist with LB ADLs secondary to recent TKA. OT educated and demonstated use of long handled shoe horn,reacher, sock aide, and long handled sponge in order to increase I in self care. Pt  engaged in simulated practice with AE equipment this session with min verbal cues for technique. Pt's dominant hand is much weaker and she requires use of R hand or both hands to use some of equipment.  OT also assisted pt in donning L thigh high TEDS.                Cognition   Behavior During Therapy: WFL for tasks assessed/performed Overall Cognitive  Status: Within Functional Limits for tasks assessed                         Exercises Total Joint Exercises Quad Sets: Strengthening;Both;Seated;15 reps Short Arc Quad: Left;AAROM;Seated;15 reps Heel Slides: AAROM;Left;15 reps Hip ABduction/ADduction: AAROM;Left;15 reps Straight Leg Raises: AAROM;Left;15 reps           Pertinent Vitals/ Pain       Pain Assessment: 0-10 Pain Score: 7  Pain Location: L knee Pain Descriptors / Indicators: Aching;Sore Pain Intervention(s): Monitored during session         Frequency Min 2X/week     Progress Toward Goals  OT Goals(current goals can now be found in the care plan section)  Progress towards OT goals: Progressing toward goals     Plan Discharge plan remains appropriate          Activity Tolerance Patient tolerated treatment well   Patient Left in chair;with call bell/phone within reach   Nurse Communication          Time: 1431-1457 OT Time Calculation (min): 26 min  Charges: OT Treatments $Self Care/Home Management : 23-37 mins  Phineas Semen, MS, OTR/L 02/14/2015, 3:13 PM

## 2015-02-14 NOTE — Progress Notes (Signed)
Physical Therapy Treatment Patient Details Name: Belinda [name] MRN: [id number] DOB: [birth year] Today's Date: 02/14/2015    History of Present Illness Pt is a 64 y/o F s/p Lt TKA.  Pt's PMH includes HTN, back pain, anemia, anxiety, migraines, dysrhythmia, DM.    PT Comments    Patient moving very slowly this morning. Patient stated that she felt very tired and weak. Patient Hgb dropped to 7.9 and she will be receiving blood later today. Will follow up this afternoon as able. Will need to work on progressive ambulation and steps to ensure safety once home.   Follow Up Recommendations  Home health PT;Supervision/Assistance - 24 hour     Equipment Recommendations  3in1 (PT)    Recommendations for Other Services       Precautions / Restrictions Precautions Precautions: Knee;Fall Precaution Comments: Reviewed knee precautions Restrictions LLE Weight Bearing: Weight bearing as tolerated    Mobility  Bed Mobility Overal bed mobility: Needs Assistance       Supine to sit: Mod assist     General bed mobility comments: Mod A for use of pad to scoot hips and position LLE out of bed. Patient with complaints of back pain with this movement  Transfers Overall transfer level: Needs assistance Equipment used: Rolling walker (2 wheeled)   Sit to Stand: Min assist         General transfer comment: Min A to power up and for stability. Cues for hand placement  Ambulation/Gait Ambulation/Gait assistance: Min guard Ambulation Distance (Feet): 25 Feet Assistive device: Rolling walker (2 wheeled) Gait Pattern/deviations: Step-to pattern;Decreased step length - right;Decreased step length - left;Decreased stance time - left;Shuffle;Trunk flexed Gait velocity: very decreased   General Gait Details: Trunk flexed which pt says is her baseline 2/2 chronic LBP.  Cues for posture and RW positioning. Limited due to fatigue   Stairs            Wheelchair Mobility    Modified  Rankin (Stroke Patients Only)       Balance                                    Cognition Arousal/Alertness: Awake/alert Behavior During Therapy: WFL for tasks assessed/performed Overall Cognitive Status: Within Functional Limits for tasks assessed                      Exercises      General Comments        Pertinent Vitals/Pain Pain Score: 7  Pain Location: L knee Pain Descriptors / Indicators: Aching;Sore Pain Intervention(s): Monitored during session;Repositioned    Home Living                      Prior Function            PT Goals (current goals can now be found in the care plan section) Progress towards PT goals: Not progressing toward goals - comment    Frequency  7X/week    PT Plan Current plan remains appropriate    Co-evaluation             End of Session Equipment Utilized During Treatment: Gait belt Activity Tolerance: Patient limited by fatigue Patient left: in chair;with call bell/phone within reach     Time: 0826-0857 PT Time Calculation (min) (ACUTE ONLY): 31 min  Charges:  $Gait Training: 8-22 mins $Therapeutic Activity: 8-22 mins  G Codes:      Jacqualyn Posey 02/14/2015, 9:10 AM 02/14/2015 Jacqualyn Posey PTA [phone] pager [phone] office

## 2015-02-14 NOTE — Progress Notes (Signed)
Subjective: 2 Days Post-Op Procedure(s) (LRB): TOTAL KNEE ARTHROPLASTY (Left) Patient reports pain as moderate.  Patient has been very lethargic with increased lightheadedness.  No nausea/vomiting, chest pain/sob.  Positive flatus, no bm.  Tolerating diet.  Objective: Vital signs in last 24 hours: Temp:  [98.5 F (36.9 C)-99.3 F (37.4 C)] 98.9 F (37.2 C) (08/19 0600) Pulse Rate:  [115-125] 115 (08/19 0600) Resp:  [18] 18 (08/19 0600) BP: (134-155)/(71-84) 141/84 mmHg (08/19 0600) SpO2:  [93 %-100 %] 100 % (08/19 0600)  Intake/Output from previous day: 08/18 0701 - 08/19 0700 In: 2074.2 [P.O.:840; I.V.:1234.2] Out: 2 [Urine:2] Intake/Output this shift:     Recent Labs  02/13/15 0612 02/14/15 0345  HGB 8.8* 7.9*    Recent Labs  02/13/15 0612 02/14/15 0345  WBC 10.0 12.0*  RBC 3.44* 3.11*  HCT 26.9* 24.1*  PLT 205 195    Recent Labs  02/13/15 0612 02/14/15 0345  NA 139 134*  K 4.7 4.2  CL 107 100*  CO2 25 23  BUN 16 13  CREATININE 1.21* 1.07*  GLUCOSE 276* 227*  CALCIUM 8.7* 8.5*   No results for input(s): LABPT, INR in the last 72 hours.  Neurologically intact Neurovascular intact Sensation intact distally Intact pulses distally Dorsiflexion/Plantar flexion intact Incision: dressing C/D/I No cellulitis present Compartment soft  Negative homans bilaterally  Assessment/Plan: 2 Days Post-Op Procedure(s) (LRB): TOTAL KNEE ARTHROPLASTY (Left) Advance diet Up with therapy Plan for discharge tomorrow home with hhpt ABLA- patient symptomatic with Hb 7.9. Will transfuse with 2 units prbc this am WBAT LLE Dry dressing change prn  Fannie Knee 02/14/2015, 8:00 AM

## 2015-02-14 NOTE — Progress Notes (Signed)
Inpatient Diabetes Program Recommendations  AACE/ADA: New Consensus Statement on Inpatient Glycemic Control (2013)  Target Ranges:  Prepandial:   less than 140 mg/dL      Peak postprandial:   less than 180 mg/dL (1-2 hours)      Critically ill patients:  140 - 180 mg/dL   Results for [name], [name] (MRN [id number]) as of 02/14/2015 10:44  Ref. Range 02/13/2015 06:18 02/13/2015 11:34 02/13/2015 16:09 02/13/2015 21:15 02/14/2015 06:32  Glucose-Capillary Latest Ref Range: 65-99 mg/dL 237 (H) 285 (H) 307 (H) 289 (H) 203 (H)    Current orders for Inpatient glycemic control: Lantus 25 units QHS, Novolog 0-9 units TID with meals, Novolog 5 units TID with meals for meal coverage, Invokana 150 mg daily  Inpatient Diabetes Program Recommendations Insulin - Basal: Please consider increasing Lantus to 28 units QHS. Correction (SSI): Please consider increasing Novolog correction to moderate scale and adding Novolog bedtime correction scale.  Thanks, Barnie Alderman, RN, MSN, CCRN, CDE Diabetes Coordinator Inpatient Diabetes Program [phone] (Team Pager from [place] to [place]) [phone] ([place] office) [phone] [facility] office) [phone] [facility] office)

## 2015-02-15 LAB — BASIC METABOLIC PANEL
Anion gap: 11 (ref 5–15)
BUN: 17 mg/dL (ref 6–20)
CO2: 22 mmol/L (ref 22–32)
Calcium: 8.4 mg/dL — ABNORMAL LOW (ref 8.9–10.3)
Chloride: 101 mmol/L (ref 101–111)
Creatinine, Ser: 1.13 mg/dL — ABNORMAL HIGH (ref 0.44–1.00)
GFR calc Af Amer: 59 mL/min — ABNORMAL LOW (ref >60)
GFR calc non Af Amer: 51 mL/min — ABNORMAL LOW (ref >60)
Glucose, Bld: 178 mg/dL — ABNORMAL HIGH (ref 65–99)
Potassium: 4 mmol/L (ref 3.5–5.1)
Sodium: 134 mmol/L — ABNORMAL LOW (ref 135–145)

## 2015-02-15 LAB — GLUCOSE, CAPILLARY
Glucose-Capillary: 183 mg/dL — ABNORMAL HIGH (ref 65–99)
Glucose-Capillary: 143 mg/dL — ABNORMAL HIGH (ref 65–99)
Glucose-Capillary: 244 mg/dL — ABNORMAL HIGH (ref 65–99)
Glucose-Capillary: 207 mg/dL — ABNORMAL HIGH (ref 65–99)

## 2015-02-15 LAB — TYPE AND SCREEN
ABO/RH(D): A POS
Antibody Screen: NEGATIVE
Unit division: 0
Unit division: 0

## 2015-02-15 LAB — CBC
HCT: 31.5 % — ABNORMAL LOW (ref 36.0–46.0)
Hemoglobin: 11 g/dL — ABNORMAL LOW (ref 12.0–15.0)
MCH: 27.2 pg (ref 26.0–34.0)
MCHC: 34.9 g/dL (ref 30.0–36.0)
MCV: 78 fL (ref 78.0–100.0)
Platelets: 151 10*3/uL (ref 150–400)
RBC: 4.04 MIL/uL (ref 3.87–5.11)
RDW: 15.6 % — ABNORMAL HIGH (ref 11.5–15.5)
WBC: 12.8 10*3/uL — ABNORMAL HIGH (ref 4.0–10.5)

## 2015-02-15 NOTE — Progress Notes (Signed)
Pt to be discharged tomorrow d/t c/o lightheadedness per MD/PA.

## 2015-02-15 NOTE — Progress Notes (Signed)
Physical Therapy Treatment Patient Details Name: Belinda [name] MRN: [id number] DOB: 1950-12-25 Today's Date: 02/15/2015    History of Present Illness Pt is a 64 y/o F s/p Lt TKA.  Pt's PMH includes HTN, back pain, anemia, anxiety, migraines, dysrhythmia, DM.    PT Comments    Patient up in chair on arrival, feeling better than this AM, still painful, but agreeable to therapy.  MIN Guard for transfers and ambulation, short, antalgic stride.  MIN/MOD assist for bed mobilty and set up with CPM.  Patient improved function since this morning, good effort with therapy.  Patient is still appropriate for skilled PT services, will benefit to address ROM, strength, transfers, bed mobility, and gait.  Follow Up Recommendations  Home health PT;Supervision/Assistance - 24 hour     Equipment Recommendations  3in1 (PT)    Recommendations for Other Services OT consult     Precautions / Restrictions Precautions Precautions: Knee;Fall Precaution Comments: Reviewed knee precautions Restrictions Weight Bearing Restrictions: Yes LLE Weight Bearing: Weight bearing as tolerated    Mobility  Bed Mobility Overal bed mobility: Needs Assistance Bed Mobility: Sit to Supine Rolling: Min guard     Sit to supine: Min assist   General bed mobility comments: In chair on arrival, MOD cues for technique  Transfers Overall transfer level: Needs assistance Equipment used: Rolling walker (2 wheeled) Transfers: Sit to/from Stand Sit to Stand: Min guard Stand pivot transfers: Min guard       General transfer comment: Cues for technique, room not spinning like this morning  Ambulation/Gait Ambulation/Gait assistance: Supervision Ambulation Distance (Feet): 60 Feet Assistive device: Rolling walker (2 wheeled) Gait Pattern/deviations: Step-to pattern;Decreased stride length;Antalgic Gait velocity: very decreased Gait velocity interpretation: Below normal speed for age/gender General Gait Details:  Trunk flexed which pt says is her baseline 2/2 chronic LBP.  Cues for posture and RW positioning. Limited due to fatigue   Stairs            Wheelchair Mobility    Modified Rankin (Stroke Patients Only)       Balance     Sitting balance-Leahy Scale: Good       Standing balance-Leahy Scale: Poor                      Cognition Arousal/Alertness: Awake/alert Behavior During Therapy: WFL for tasks assessed/performed Overall Cognitive Status: Within Functional Limits for tasks assessed                      Exercises Total Joint Exercises Ankle Circles/Pumps: AROM;Both;15 reps;Seated Quad Sets: AROM;Left;10 reps;Supine Heel Slides: AAROM;Left;10 reps Hip ABduction/ADduction: AAROM;Left;10 reps Straight Leg Raises: AAROM;Left;5 reps Goniometric ROM: 0-75 (CPM)    General Comments        Pertinent Vitals/Pain Pain Assessment: 0-10 Pain Score: 6  Pain Location: L knee Pain Descriptors / Indicators: Aching;Sore Pain Intervention(s): Monitored during session;Limited activity within patient's tolerance;Repositioned    Home Living Family/patient expects to be discharged to:: Private residence Living Arrangements: Spouse/significant other Available Help at Discharge: Available 24 hours/day;Family (husband) Type of Home: House Home Access: Stairs to enter Entrance Stairs-Rails: None Home Layout: One level Home Equipment: Cane - single point;Walker - 2 wheels;Shower seat - built in;Shower seat      Prior Function Level of Independence: Independent with assistive device(s)      Comments: PTA used cane when out in community and prn in home   PT Goals (current goals can now be found in  the care plan section) Acute Rehab PT Goals Patient Stated Goal: to get stronger and go home PT Goal Formulation: With patient Time For Goal Achievement: 02/19/15 Potential to Achieve Goals: Good Progress towards PT goals: Progressing toward goals    Frequency   7X/week    PT Plan Current plan remains appropriate    Co-evaluation             End of Session Equipment Utilized During Treatment: Gait belt Activity Tolerance: Patient tolerated treatment well Patient left: with call bell/phone within reach;in bed;in CPM     Time: [id number] PT Time Calculation (min) (ACUTE ONLY): 40 min  Charges:  $Gait Training: 8-22 mins $Therapeutic Exercise: 8-22 mins $Therapeutic Activity: 8-22 mins                    G Codes:      Hikeem Andersson L 03-04-2015, 4:01 PM

## 2015-02-15 NOTE — Discharge Instructions (Signed)
INSTRUCTIONS AFTER JOINT REPLACEMENT   o Remove items at home which could result in a fall. This includes throw rugs or furniture in walking pathways o ICE to the affected joint every three hours while awake for 30 minutes at a time, for at least the first 3-5 days, and then as needed for pain and swelling.  Continue to use ice for pain and swelling. You may notice swelling that will progress down to the foot and ankle.  This is normal after surgery.  Elevate your leg when you are not up walking on it.   o Continue to use the breathing machine you got in the hospital (incentive spirometer) which will help keep your temperature down.  It is common for your temperature to cycle up and down following surgery, especially at night when you are not up moving around and exerting yourself.  The breathing machine keeps your lungs expanded and your temperature down.  BLOOD THINNER: TAKE ELIQUIS AS PRESCRIBED FOR A TOTAL OF 14 DAYS FOLLOWING SURGERY TO PREVENT BLOOD CLOTS.  ONCE FINISHED WITH THIS, TAKE ASPIRIN 325 MG ONE TAB ONCE DAILY FOR THE NEXT 14 DAYS.  THIS IS ALSO TO HELP PREVENT BLOOD CLOTS.  DIET:  As you were doing prior to hospitalization, we recommend a well-balanced diet.  DRESSING / WOUND CARE / SHOWERING  You may change your dressing 3-5 days after surgery.  Then change the dressing every day with sterile gauze.  Please use good hand washing techniques before changing the dressing.  Do not use any lotions or creams on the incision until instructed by your surgeon. and You may shower 3 days after surgery, but keep the wounds dry during showering.  You may use an occlusive plastic wrap (Press'n Seal for example), NO SOAKING/SUBMERGING IN THE BATHTUB.  If the bandage gets wet, change with a clean dry gauze.  If the incision gets wet, pat the wound dry with a clean towel.  ACTIVITY  o Increase activity slowly as tolerated, but follow the weight bearing instructions below.   o No driving for 6 weeks  or until further direction given by your physician.  You cannot drive while taking narcotics.  o No lifting or carrying greater than 10 lbs. until further directed by your surgeon. o Avoid periods of inactivity such as sitting longer than an hour when not asleep. This helps prevent blood clots.  o You may return to work once you are authorized by your doctor.     WEIGHT BEARING   Weight bearing as tolerated with assist device (walker, cane, etc) as directed, use it as long as suggested by your surgeon or therapist, typically at least 4-6 weeks.   EXERCISES  Results after joint replacement surgery are often greatly improved when you follow the exercise, range of motion and muscle strengthening exercises prescribed by your doctor. Safety measures are also important to protect the joint from further injury. Any time any of these exercises cause you to have increased pain or swelling, decrease what you are doing until you are comfortable again and then slowly increase them. If you have problems or questions, call your caregiver or physical therapist for advice.   Rehabilitation is important following a joint replacement. After just a few days of immobilization, the muscles of the leg can become weakened and shrink (atrophy).  These exercises are designed to build up the tone and strength of the thigh and leg muscles and to improve motion. Often times heat used for twenty to thirty  minutes before working out will loosen up your tissues and help with improving the range of motion but do not use heat for the first two weeks following surgery (sometimes heat can increase post-operative swelling).   These exercises can be done on a training (exercise) mat, on the floor, on a table or on a bed. Use whatever works the best and is most comfortable for you.    Use music or television while you are exercising so that the exercises are a pleasant break in your day. This will make your life better with the  exercises acting as a break in your routine that you can look forward to.   Perform all exercises about fifteen times, three times per day or as directed.  You should exercise both the operative leg and the other leg as well.  Exercises include:    Quad Sets - Tighten up the muscle on the front of the thigh (Quad) and hold for 5-10 seconds.    Straight Leg Raises - With your knee straight (if you were given a brace, keep it on), lift the leg to 60 degrees, hold for 3 seconds, and slowly lower the leg.  Perform this exercise against resistance later as your leg gets stronger.   Leg Slides: Lying on your back, slowly slide your foot toward your buttocks, bending your knee up off the floor (only go as far as is comfortable). Then slowly slide your foot back down until your leg is flat on the floor again.   Angel Wings: Lying on your back spread your legs to the side as far apart as you can without causing discomfort.   Hamstring Strength:  Lying on your back, push your heel against the floor with your leg straight by tightening up the muscles of your buttocks.  Repeat, but this time bend your knee to a comfortable angle, and push your heel against the floor.  You may put a pillow under the heel to make it more comfortable if necessary.   A rehabilitation program following joint replacement surgery can speed recovery and prevent re-injury in the future due to weakened muscles. Contact your doctor or a physical therapist for more information on knee rehabilitation.    CONSTIPATION  Constipation is defined medically as fewer than three stools per week and severe constipation as less than one stool per week.  Even if you have a regular bowel pattern at home, your normal regimen is likely to be disrupted due to multiple reasons following surgery.  Combination of anesthesia, postoperative narcotics, change in appetite and fluid intake all can affect your bowels.   YOU MUST use at least one of the  following options; they are listed in order of increasing strength to get the job done.  They are all available over the counter, and you may need to use some, POSSIBLY even all of these options:    Drink plenty of fluids (prune juice may be helpful) and high fiber foods Colace 100 mg by mouth twice a day  Senokot for constipation as directed and as needed Dulcolax (bisacodyl), take with full glass of water  Miralax (polyethylene glycol) once or twice a day as needed.  If you have tried all these things and are unable to have a bowel movement in the first 3-4 days after surgery call either your surgeon or your primary doctor.    If you experience loose stools or diarrhea, hold the medications until you stool forms back up.  If your symptoms  do not get better within 1 week or if they get worse, check with your doctor.  If you experience "the worst abdominal pain ever" or develop nausea or vomiting, please contact the office immediately for further recommendations for treatment.   ITCHING:  If you experience itching with your medications, try taking only a single pain pill, or even half a pain pill at a time.  You can also use Benadryl over the counter for itching or also to help with sleep.   TED HOSE STOCKINGS:  Use stockings on both legs until for at least 2 weeks or as directed by physician office. They may be removed at night for sleeping.  MEDICATIONS:  See your medication summary on the After Visit Summary that nursing will review with you.  You may have some home medications which will be placed on hold until you complete the course of blood thinner medication.  It is important for you to complete the blood thinner medication as prescribed.  PRECAUTIONS:  If you experience chest pain or shortness of breath - call 911 immediately for transfer to the hospital emergency department.   If you develop a fever greater that 101 F, purulent drainage from wound, increased redness or drainage from  wound, foul odor from the wound/dressing, or calf pain - CONTACT YOUR SURGEON.                                                   FOLLOW-UP APPOINTMENTS:  If you do not already have a post-op appointment, please call the office for an appointment to be seen by your surgeon.  Guidelines for how soon to be seen are listed in your After Visit Summary, but are typically between 1-4 weeks after surgery.  OTHER INSTRUCTIONS:   Knee Replacement:  Do not place pillow under knee, focus on keeping the knee straight while resting. CPM instructions: 0-90 degrees, 2 hours in the morning, 2 hours in the afternoon, and 2 hours in the evening. Place foam block, curve side up under heel at all times except when in CPM or when walking.  DO NOT modify, tear, cut, or change the foam block in any way.  MAKE SURE YOU:   Understand these instructions.   Get help right away if you are not doing well or get worse.    Thank you for letting us be a part of your medical care team.  It is a privilege we respect greatly.  We hope these instructions will help you stay on track for a fast and full recovery!   Information on my medicine - ELIQUIS (apixaban)  This medication education was reviewed with me or my healthcare representative as part of my discharge preparation.  The pharmacist that spoke with me during my hospital stay was:  [facility], Margot Chimes, [facility]  Why was Eliquis prescribed for you? Eliquis was prescribed for you to reduce the risk of blood clots forming after orthopedic surgery.    What do You need to know about Eliquis? Take your Eliquis TWICE DAILY - one tablet in the morning and one tablet in the evening with or without food.  It would be best to take the dose about the same time each day.  If you have difficulty swallowing the tablet whole please discuss with your pharmacist how to take the medication safely.  Take Eliquis  exactly as prescribed by your doctor and DO NOT stop taking  Eliquis without talking to the doctor who prescribed the medication.  Stopping without other medication to take the place of Eliquis may increase your risk of developing a clot.  After discharge, you should have regular check-up appointments with your healthcare provider that is prescribing your Eliquis.  What do you do if you miss a dose? If a dose of ELIQUIS is not taken at the scheduled time, take it as soon as possible on the same day and twice-daily administration should be resumed.  The dose should not be doubled to make up for a missed dose.  Do not take more than one tablet of ELIQUIS at the same time.  Important Safety Information A possible side effect of Eliquis is bleeding. You should call your healthcare provider right away if you experience any of the following: ? Bleeding from an injury or your nose that does not stop. ? Unusual colored urine (red or dark brown) or unusual colored stools (red or black). ? Unusual bruising for unknown reasons. ? A serious fall or if you hit your head (even if there is no bleeding).  Some medicines may interact with Eliquis and might increase your risk of bleeding or clotting while on Eliquis. To help avoid this, consult your healthcare provider or pharmacist prior to using any new prescription or non-prescription medications, including herbals, vitamins, non-steroidal anti-inflammatory drugs (NSAIDs) and supplements.  This website has more information on Eliquis (apixaban): http://www.eliquis.com/eliquis/home

## 2015-02-15 NOTE — Progress Notes (Signed)
Physical Therapy Treatment Patient Details Name: Belinda [name] MRN: [id number] DOB: Feb 25, 1951 Today's Date: 02/15/2015    History of Present Illness Pt is a 64 y/o F s/p Lt TKA.  Pt's PMH includes HTN, back pain, anemia, anxiety, migraines, dysrhythmia, DM.    PT Comments    Patient reports she is lightheaded feeling this morning, states room was spinning earlier when working with OT, request no treatment, but agreeable to exercises for pain and ROM in chair.  Patient demonstrating increasing muscle strength for L LE, plan to return later today for functional mobility training as tolerated.  Patient remains appropriate for skilled PT services.  Follow Up Recommendations  Home health PT;Supervision/Assistance - 24 hour     Equipment Recommendations  3in1 (PT)    Recommendations for Other Services OT consult     Precautions / Restrictions Precautions Precautions: Knee;Fall Precaution Comments: Reviewed knee precautions Restrictions Weight Bearing Restrictions: Yes LLE Weight Bearing: Weight bearing as tolerated    Mobility  Bed Mobility               General bed mobility comments: in recliner upon arrival to room  Transfers Overall transfer level: Needs assistance               General transfer comment: unable to complete sit/stand as pt. became very "woozy" and states the room was spinning  Ambulation/Gait                 Stairs            Wheelchair Mobility    Modified Rankin (Stroke Patients Only)       Balance                                    Cognition Arousal/Alertness: Awake/alert Behavior During Therapy: WFL for tasks assessed/performed Overall Cognitive Status: Within Functional Limits for tasks assessed                      Exercises Total Joint Exercises Ankle Circles/Pumps: AROM;Both;15 reps;Seated Quad Sets: Strengthening;Both;Seated;15 reps Short Arc Quad: Left;AAROM;Seated;15 reps Hip  ABduction/ADduction: AAROM;Left;15 reps Straight Leg Raises: AAROM;Left;15 reps Long Arc Quad: AAROM;Left;10 reps Knee Flexion: AAROM;Left;15 reps;Seated Goniometric ROM: 0-80 (visually)    General Comments        Pertinent Vitals/Pain Pain Assessment: 0-10 Pain Score: 7  Pain Location: L knee Pain Descriptors / Indicators: Aching;Sore Pain Intervention(s): Premedicated before session;Limited activity within patient's tolerance    Home Living Family/patient expects to be discharged to:: Private residence Living Arrangements: Spouse/significant other Available Help at Discharge: Available 24 hours/day;Family (husband) Type of Home: House Home Access: Stairs to enter Entrance Stairs-Rails: None Home Layout: One level Home Equipment: Cane - single point;Walker - 2 wheels;Shower seat - built in;Shower seat      Prior Function Level of Independence: Independent with assistive device(s)      Comments: PTA used cane when out in community and prn in home   PT Goals (current goals can now be found in the care plan section) Acute Rehab PT Goals Patient Stated Goal: to get stronger and go home PT Goal Formulation: With patient Time For Goal Achievement: 02/19/15 Potential to Achieve Goals: Good    Frequency  7X/week    PT Plan Current plan remains appropriate    Co-evaluation             End of Session  Activity Tolerance: Patient limited by pain Patient left: in chair;with call bell/phone within reach     Time: 1020-1040 PT Time Calculation (min) (ACUTE ONLY): 20 min  Charges:  $Therapeutic Exercise: 8-22 mins                    G Codes:      Belinda [name] L 02/16/15, 12:25 PM

## 2015-02-15 NOTE — Progress Notes (Signed)
Orthopedic Tech Progress Note Patient Details:  Belinda [name] 1950/08/19 [id number] On cpm at 7:00pm Patient ID: [name], female   DOB: September 30, 1950, 64 y.o.   MRN: [id number]   Braulio Bosch 02/15/2015, 7:05 PM

## 2015-02-15 NOTE — Progress Notes (Signed)
CM spoke with pt who states she has all of her DME at home. CM called [facility] rep, Tiffany to notify of discharge.  No other CM needs were communicated.

## 2015-02-15 NOTE — Progress Notes (Signed)
Patient ID: Belinda [name], female   DOB: [birth year], 64 y.o.   MRN: [id number]     Subjective:  Patient reports pain as mild to moderate.  Patient doing better but still light headed.  Denies any CP or SOB  Objective:   VITALS:   Filed Vitals:   02/14/15 1629 02/14/15 1944 02/15/15 0548 02/15/15 1127  BP: 143/73 149/80 148/80 147/80  Pulse: 115 115 115 109  Temp: 98.4 F (36.9 C) 99.9 F (37.7 C) 99.8 F (37.7 C)   TempSrc: Oral Oral Oral   Resp: 20 18 18 18   Height:      Weight:      SpO2: 94% 95% 93% 94%    ABD soft Sensation intact distally Intact pulses distally Dorsiflexion/Plantar flexion intact Patient sitting in the chair in no acute distress  Lab Results  Component Value Date   WBC 12.8* 02/15/2015   HGB 11.0* 02/15/2015   HCT 31.5* 02/15/2015   MCV 78.0 02/15/2015   PLT 151 02/15/2015   BMET    Component Value Date/Time   NA 134* 02/15/2015 0353   K 4.0 02/15/2015 0353   CL 101 02/15/2015 0353   CO2 22 02/15/2015 0353   GLUCOSE 178* 02/15/2015 0353   BUN 17 02/15/2015 0353   CREATININE 1.13* 02/15/2015 0353   CALCIUM 8.4* 02/15/2015 0353   GFRNONAA 51* 02/15/2015 0353   GFRAA 59* 02/15/2015 0353     Assessment/Plan: 3 Days Post-Op   Active Problems:   DJD (degenerative joint disease) of knee   Advance diet Up with therapy Plan for discharge tomorrow Patient does not feel ready for DC today  WBAT Dry dressing PRN   Remonia Richter 02/15/2015, 2:46 PM  Discussed and agree with above.  Marchia Bond, MD Cell [phone]

## 2015-02-15 NOTE — Progress Notes (Signed)
Occupational Therapy Treatment Patient Details Name: Belinda [name] MRN: [id number] DOB: 1951/01/11 Today's Date: 02/15/2015    History of present illness Pt is a 64 y/o F s/p Lt TKA.  Pt's PMH includes HTN, back pain, anemia, anxiety, migraines, dysrhythmia, DM.   OT comments  Focus of session was instruction and demonstration of shower stall transfer.  Pt. Unable to complete return demo secondary to c/o "wooziness" from pain meds.  Will attempt return demo at next session.    Follow Up Recommendations  Home health OT;Supervision/Assistance - 24 hour    Equipment Recommendations  3 in 1 bedside comode    Recommendations for Other Services      Precautions / Restrictions Precautions Precautions: Knee;Fall Precaution Comments: Reviewed knee precautions Restrictions LLE Weight Bearing: Weight bearing as tolerated       Mobility Bed Mobility               General bed mobility comments: in recliner upon arrival to room  Transfers Overall transfer level: Needs assistance               General transfer comment: unable to complete sit/stand as pt. became very "woozy" and states the room was spinning    Balance                                   ADL Overall ADL's : Needs assistance/impaired                                 Tub/ Shower Transfer: Walk-in shower;Grab bars;Shower Scientist, research (medical) Details (indicate cue type and reason): provided demo and education on shower stall transfer   General ADL Comments: provided demonstration and education for shower stall transfer, pt. reports she has seat in her shower stall.  when attempting to stand she states the room was spinning and that she is "woozy" states she does not feel comfortable attempting to stand righ now.        Vision                     Perception     Praxis      Cognition   Behavior During Therapy: WFL for tasks assessed/performed Overall Cognitive  Status: Within Functional Limits for tasks assessed                       Extremity/Trunk Assessment               Exercises     Shoulder Instructions       General Comments      Pertinent Vitals/ Pain       Pain Assessment: No/denies pain Pain Intervention(s): Premedicated before session  Home Living                                          Prior Functioning/Environment              Frequency Min 2X/week     Progress Toward Goals  OT Goals(current goals can now be found in the care plan section)  Progress towards OT goals: Progressing toward goals     Plan Discharge plan remains appropriate    Co-evaluation  End of Session     Activity Tolerance Other (comment) (limited to reported "side effects" of pain meds)   Patient Left in chair;with call bell/phone within reach   Nurse Communication          Time: [phone] OT Time Calculation (min): 10 min  Charges: OT General Charges $OT Visit: 1 Procedure OT Treatments $Self Care/Home Management : 8-22 mins  Janice Coffin, COTA/L 02/15/2015, 9:38 AM

## 2015-02-16 LAB — GLUCOSE, CAPILLARY
Glucose-Capillary: 238 mg/dL — ABNORMAL HIGH (ref 65–99)
Glucose-Capillary: 245 mg/dL — ABNORMAL HIGH (ref 65–99)
Glucose-Capillary: 248 mg/dL — ABNORMAL HIGH (ref 65–99)
Glucose-Capillary: 257 mg/dL — ABNORMAL HIGH (ref 65–99)

## 2015-02-16 NOTE — Progress Notes (Signed)
Patient ID: Belinda [name], female   DOB: 06-Jan-1951, 64 y.o.   MRN: [id number]     Subjective:  Patient reports pain as mild to moderate.  Patient still complains of being light headed.  Denies any CP or SOB  Objective:   VITALS:   Filed Vitals:   02/15/15 0548 02/15/15 1127 02/15/15 2006 02/16/15 0534  BP: 148/80 147/80 141/77 147/74  Pulse: 115 109 117 116  Temp: 99.8 F (37.7 C)  99.8 F (37.7 C) 100 F (37.8 C)  TempSrc: Oral  Oral Oral  Resp: 18 18 17 16   Height:      Weight:      SpO2: 93% 94% 100% 94%    ABD soft Sensation intact distally Dorsiflexion/Plantar flexion intact Incision: dressing C/D/I and no drainage Wound Good   Lab Results  Component Value Date   WBC 12.8* 02/15/2015   HGB 11.0* 02/15/2015   HCT 31.5* 02/15/2015   MCV 78.0 02/15/2015   PLT 151 02/15/2015   BMET    Component Value Date/Time   NA 134* 02/15/2015 0353   K 4.0 02/15/2015 0353   CL 101 02/15/2015 0353   CO2 22 02/15/2015 0353   GLUCOSE 178* 02/15/2015 0353   BUN 17 02/15/2015 0353   CREATININE 1.13* 02/15/2015 0353   CALCIUM 8.4* 02/15/2015 0353   GFRNONAA 51* 02/15/2015 0353   GFRAA 59* 02/15/2015 0353     Assessment/Plan: 4 Days Post-Op   Active Problems:   DJD (degenerative joint disease) of knee   Advance diet Up with therapy Plan for discharge tomorrow WBAT Dry dressing PRN   Remonia Richter 02/16/2015, 10:31 AM  Discussed and agree with above.  Patient does not feel ready for dc due to light headedness.  Marchia Bond, MD Cell [phone]

## 2015-02-16 NOTE — Progress Notes (Signed)
Occupational Therapy Treatment Patient Details Name: Belinda [name] MRN: [id number] DOB: 1950/07/05 Today's Date: 02/16/2015    History of present illness Pt is a 65 y/o F s/p Lt TKA.  Pt's PMH includes HTN, back pain, anemia, anxiety, migraines, dysrhythmia, DM.   OT comments  Pt progressing towards acute OT goals. Focus of session was toilet/shower transfer as detailed below. Reviewed ADL education. D/c plan remains appropriate  Follow Up Recommendations  Home health OT;Supervision/Assistance - 24 hour    Equipment Recommendations  3 in 1 bedside comode    Recommendations for Other Services      Precautions / Restrictions Precautions Precautions: Knee;Fall Precaution Comments: Reviewed knee precautions Restrictions Weight Bearing Restrictions: Yes LLE Weight Bearing: Weight bearing as tolerated       Mobility Bed Mobility               General bed mobility comments: in recliner  Transfers Overall transfer level: Needs assistance Equipment used: Rolling walker (2 wheeled) Transfers: Sit to/from Stand Sit to Stand: Min guard;From elevated surface         General transfer comment: close min guard from recliner and 3n1. Extra time.    Balance Overall balance assessment: Needs assistance Sitting-balance support: No upper extremity supported;Feet supported Sitting balance-Leahy Scale: Good     Standing balance support: Bilateral upper extremity supported;During functional activity Standing balance-Leahy Scale: Poor                     ADL Overall ADL's : Needs assistance/impaired                         Toilet Transfer: Min guard;Ambulation;RW Toilet Transfer Details (indicate cue type and reason): 3n1 over toilet, cues for technique with rw Toileting- Clothing Manipulation and Hygiene: Min guard;Sit to/from Nurse, children's Details (indicate cue type and reason): ambulated to 3n1, discussed shower transfer  options Functional mobility during ADLs: Min guard;Rolling walker General ADL Comments: Pt ambulaled to bathroom and completed transfer to 3n1 as detailed above. Reviewed walkin shower transfer strategies. Reviewed ADL education including AE and compensatory techniques. Pt moves very slowly and cautiously.      Vision                     Perception     Praxis      Cognition   Behavior During Therapy: WFL for tasks assessed/performed Overall Cognitive Status: Within Functional Limits for tasks assessed                       Extremity/Trunk Assessment               Exercises     Shoulder Instructions       General Comments      Pertinent Vitals/ Pain       Pain Assessment: 0-10 Pain Score: 5  Pain Location: Lt knee Pain Descriptors / Indicators: Aching;Sore Pain Intervention(s): Limited activity within patient's tolerance;Monitored during session;Repositioned  Home Living                                          Prior Functioning/Environment              Frequency Min 2X/week     Progress Toward Goals  OT Goals(current goals can  now be found in the care plan section)  Progress towards OT goals: Progressing toward goals  Acute Rehab OT Goals Patient Stated Goal: to get stronger and go home OT Goal Formulation: With patient Time For Goal Achievement: 02/27/15 Potential to Achieve Goals: Good ADL Goals Pt Will Perform Grooming: with modified independence;standing Pt Will Perform Lower Body Bathing: with modified independence;sit to/from stand;with adaptive equipment Pt Will Perform Lower Body Dressing: sit to/from stand;with adaptive equipment;with modified independence Pt Will Transfer to Toilet: with modified independence;ambulating;bedside commode Pt Will Perform Tub/Shower Transfer: Shower transfer;shower seat;rolling walker;with modified independence;ambulating Additional ADL Goal #1: Pt will independently  verbalize and adhere to knee precautions 100% of the time  Plan Discharge plan remains appropriate    Co-evaluation                 End of Session Equipment Utilized During Treatment: Gait belt;Rolling walker   Activity Tolerance Patient tolerated treatment well   Patient Left in chair;with call bell/phone within reach   Nurse Communication          Time: 1233-1300 OT Time Calculation (min): 27 min  Charges: OT General Charges $OT Visit: 1 Procedure OT Treatments $Self Care/Home Management : 23-37 mins  Hortencia Pilar 02/16/2015, 1:15 PM

## 2015-02-16 NOTE — Progress Notes (Signed)
Physical Therapy Treatment Patient Details Name: Belinda [name] MRN: [id number] DOB: Nov 06, 1950 Today's Date: 02/16/2015    History of Present Illness Pt is a 64 y/o F s/p Lt TKA.  Pt's PMH includes HTN, back pain, anemia, anxiety, migraines, dysrhythmia, DM.    PT Comments    Belinda [name] is making modest progress w/ therapy and demonstrated emerging step through gait pattern this session.  Per pt and RN she will likely be d/c tomorrow as she has been having some c/o dizziness today and yesterday.  Pt will benefit from continued skilled PT services to increase functional independence and safety.   Follow Up Recommendations  Home health PT;Supervision/Assistance - 24 hour     Equipment Recommendations  3in1 (PT)    Recommendations for Other Services OT consult     Precautions / Restrictions Precautions Precautions: Knee;Fall Precaution Comments: Reviewed knee precautions Restrictions Weight Bearing Restrictions: Yes LLE Weight Bearing: Weight bearing as tolerated    Mobility  Bed Mobility               General bed mobility comments: Pt in recliner upon PT arrival  Transfers Overall transfer level: Needs assistance Equipment used: Rolling walker (2 wheeled) Transfers: Sit to/from Stand Sit to Stand: Min guard Stand pivot transfers: Min guard       General transfer comment: Min guard 2/2 pt's c/o some dizziness at end of session prior to sitting.  Dizziness dissipates once sitting for ~1 min.  Ambulation/Gait Ambulation/Gait assistance: Supervision Ambulation Distance (Feet): 100 Feet Assistive device: Rolling walker (2 wheeled) Gait Pattern/deviations: Step-to pattern;Step-through pattern;Antalgic;Trunk flexed;Decreased weight shift to left;Decreased stride length;Decreased stance time - left Gait velocity: very decreased Gait velocity interpretation: Below normal speed for age/gender General Gait Details: Trunk flexed which pt says is her baseline 2/2  chronic LBP.  Cues for step through gait pattern which is emerging, requiring multiple reminder cues.   Stairs            Wheelchair Mobility    Modified Rankin (Stroke Patients Only)       Balance Overall balance assessment: Needs assistance Sitting-balance support: No upper extremity supported;Feet supported Sitting balance-Leahy Scale: Good     Standing balance support: No upper extremity supported;During functional activity Standing balance-Leahy Scale: Fair Standing balance comment: Pt able to doff gown standing in front of recliner chair w/o assist and w/o either UE supported                    Cognition Arousal/Alertness: Awake/alert Behavior During Therapy: WFL for tasks assessed/performed Overall Cognitive Status: Within Functional Limits for tasks assessed                      Exercises Total Joint Exercises Ankle Circles/Pumps: AROM;Both;15 reps;Seated Quad Sets: AROM;10 reps;Supine;Both Long Arc Quad: AAROM;Left;5 reps;Seated Knee Flexion: AROM;Left;5 reps;Seated Goniometric ROM: 0-86    General Comments        Pertinent Vitals/Pain Pain Assessment: 0-10 Pain Score: 8  Pain Location: Lt knee Pain Descriptors / Indicators: Aching;Discomfort;Grimacing Pain Intervention(s): Limited activity within patient's tolerance;Monitored during session;Repositioned;Patient requesting pain meds-RN notified;RN gave pain meds during session    Home Living                      Prior Function            PT Goals (current goals can now be found in the care plan section) Acute Rehab PT Goals Patient Stated Goal:  to get stronger and go home PT Goal Formulation: With patient Time For Goal Achievement: 02/19/15 Potential to Achieve Goals: Good Progress towards PT goals: Progressing toward goals    Frequency  7X/week    PT Plan Current plan remains appropriate    Co-evaluation             End of Session Equipment Utilized  During Treatment: Gait belt;Left knee immobilizer Activity Tolerance: Patient tolerated treatment well Patient left: with call bell/phone within reach;in chair     Time: [id number] PT Time Calculation (min) (ACUTE ONLY): 31 min  Charges:  $Gait Training: 8-22 mins $Therapeutic Exercise: 8-22 mins                    G Codes:      Joslyn Hy PT, [place] [id number] Pager: [phone] 02/16/2015, 2:17 PM

## 2015-02-17 LAB — GLUCOSE, CAPILLARY
Glucose-Capillary: 243 mg/dL — ABNORMAL HIGH (ref 65–99)
Glucose-Capillary: 262 mg/dL — ABNORMAL HIGH (ref 65–99)

## 2015-02-17 NOTE — Progress Notes (Signed)
Patient ID: CLAUDIE PAJARES, female   DOB: 27-Feb-1951, 64 y.o.   MRN: [id number]     Subjective:  Patient reports pain as mild.  Patient states that she is better today and should be better to go home  Objective:   VITALS:   Filed Vitals:   02/16/15 0534 02/16/15 1450 02/16/15 1925 02/17/15 0556  BP: 147/74 145/78 138/65 137/76  Pulse: 116 114 119 106  Temp: 100 F (37.8 C) 98.9 F (37.2 C) 98.5 F (36.9 C) 98.8 F (37.1 C)  TempSrc: Oral  Oral Oral  Resp: 16 16 16 16   Height:      Weight:      SpO2: 94% 95% 100% 95%    ABD soft Sensation intact distally Dorsiflexion/Plantar flexion intact Incision: dressing C/D/I and no drainage   Lab Results  Component Value Date   WBC 12.8* 02/15/2015   HGB 11.0* 02/15/2015   HCT 31.5* 02/15/2015   MCV 78.0 02/15/2015   PLT 151 02/15/2015   BMET    Component Value Date/Time   NA 134* 02/15/2015 0353   K 4.0 02/15/2015 0353   CL 101 02/15/2015 0353   CO2 22 02/15/2015 0353   GLUCOSE 178* 02/15/2015 0353   BUN 17 02/15/2015 0353   CREATININE 1.13* 02/15/2015 0353   CALCIUM 8.4* 02/15/2015 0353   GFRNONAA 51* 02/15/2015 0353   GFRAA 59* 02/15/2015 0353     Assessment/Plan: 5 Days Post-Op   Active Problems:   DJD (degenerative joint disease) of knee   Advance diet Up with therapy Discharge home with home health WBAT Dry dressing PRN    Remonia Richter 02/17/2015, 7:34 AM   Marchia Bond, MD Cell [phone]

## 2015-02-17 NOTE — Progress Notes (Signed)
Physical Therapy Treatment Patient Details Name: Belinda [name] MRN: [id number] DOB: 1950/09/25 Today's Date: 02/17/2015    History of Present Illness Pt is a 64 y/o F s/p Lt TKA.  Pt's PMH includes HTN, back pain, anemia, anxiety, migraines, dysrhythmia, DM.    PT Comments    Pt is appropriate for d/c from a mobility standpoint.  Pt will benefit from continued skilled PT services to increase functional independence and safety.  Follow Up Recommendations  Home health PT;Supervision/Assistance - 24 hour     Equipment Recommendations  3in1 (PT)    Recommendations for Other Services       Precautions / Restrictions Precautions Precautions: Knee;Fall Restrictions Weight Bearing Restrictions: Yes LLE Weight Bearing: Weight bearing as tolerated    Mobility  Bed Mobility               General bed mobility comments: Pt in recliner upon PT arrival  Transfers Overall transfer level: Needs assistance Equipment used: Rolling walker (2 wheeled) Transfers: Sit to/from Stand Sit to Stand: Supervision Stand pivot transfers: Supervision       General transfer comment: Supervision and pt w/ good technice.  Cues to scoot toward EOB and to flex both knees so she can push through Bil LE when standing.  Ambulation/Gait Ambulation/Gait assistance: Min guard Ambulation Distance (Feet): 15 Feet Assistive device: Rolling walker (2 wheeled) Gait Pattern/deviations: Step-through pattern;Antalgic;Trunk flexed;Decreased weight shift to left;Decreased stride length;Decreased stance time - left Gait velocity: very decreased Gait velocity interpretation: Below normal speed for age/gender General Gait Details: Trunk flexed which pt says is her baseline 2/2 chronic LBP.  Cues for step through gait pattern which is emerging, requiring multiple reminder cues.  Did not use KI this session and no knee buckle noted.   Stairs            Wheelchair Mobility    Modified Rankin (Stroke  Patients Only)       Balance Overall balance assessment: Needs assistance Sitting-balance support: No upper extremity supported;Feet supported Sitting balance-Leahy Scale: Good     Standing balance support: Bilateral upper extremity supported;During functional activity Standing balance-Leahy Scale: Fair                      Cognition Arousal/Alertness: Awake/alert Behavior During Therapy: WFL for tasks assessed/performed Overall Cognitive Status: Within Functional Limits for tasks assessed                      Exercises Total Joint Exercises Ankle Circles/Pumps: AROM;Both;15 reps;Seated Quad Sets: AROM;10 reps;Supine;Both Long Arc Quad: Left;5 reps;Seated;AROM Knee Flexion: AROM;Left;5 reps;Seated Goniometric ROM: 4-88    General Comments        Pertinent Vitals/Pain Pain Assessment: 0-10 Pain Score: 6  Pain Location: Lt knee Pain Descriptors / Indicators: Aching Pain Intervention(s): Limited activity within patient's tolerance;Monitored during session;Repositioned    Home Living                      Prior Function            PT Goals (current goals can now be found in the care plan section) Acute Rehab PT Goals Patient Stated Goal: to get stronger and go home PT Goal Formulation: With patient Time For Goal Achievement: 02/19/15 Potential to Achieve Goals: Good Progress towards PT goals: Progressing toward goals    Frequency  7X/week    PT Plan Current plan remains appropriate    Co-evaluation  End of Session Equipment Utilized During Treatment: Gait belt Activity Tolerance: Patient tolerated treatment well Patient left: with call bell/phone within reach;Other (comment);with nursing/sitter in room (on [facility], nurse tech in bathroom w/ pt, trying to have a BM)     Time: [id number] PT Time Calculation (min) (ACUTE ONLY): 17 min  Charges:  $Therapeutic Exercise: 8-22 mins                    G Codes:       Joslyn Hy PT, [place] [id number] Pager: [phone] 02/17/2015, 12:03 PM

## 2015-02-17 NOTE — Progress Notes (Signed)
Inpatient Diabetes Program Recommendations  AACE/ADA: New Consensus Statement on Inpatient Glycemic Control (2013)  Target Ranges:  Prepandial:   less than 140 mg/dL      Peak postprandial:   less than 180 mg/dL (1-2 hours)      Critically ill patients:  140 - 180 mg/dL   Results for MYJA, REXING (MRN [id number]) as of 02/17/2015 11:14  Ref. Range 02/16/2015 06:38 02/16/2015 11:33 02/16/2015 16:02 02/16/2015 21:20 02/17/2015 06:39  Glucose-Capillary Latest Ref Range: 65-99 mg/dL 238 (H) 245 (H) 248 (H) 257 (H) 243 (H)   Diabetes history: Diabetes Mellitus Outpatient Diabetes medications: U500 insulin- 14 units (equivalent to 70 units of U100 insulin) with lunch and 6 units (equivalent of 30 units of U100 insulin) q HS Current orders for Inpatient glycemic control: Invokana 150 mg daily, Lantus 25 units QHS, Novolog 0-9 units TID with meals, Novolog 5 units TID with meals for meal coverage   Inpatient Diabetes Program Recommendations Insulin - Basal: Please consider increasing Lantus to 28 units QHS. Correction (SSI): Please consider ordering Novolog bedtime correction scale.  Thanks, Barnie Alderman, RN, MSN, CCRN, CDE Diabetes Coordinator Inpatient Diabetes Program [phone] (Team Pager from [place] to [place]) [phone] ([place] office) [phone] [facility] [place] office) [phone] [facility] office)

## 2015-02-20 ENCOUNTER — Encounter (HOSPITAL_COMMUNITY): Payer: Self-pay | Admitting: Orthopedic Surgery

## 2015-03-05 ENCOUNTER — Other Ambulatory Visit: Payer: Self-pay | Admitting: Cardiology

## 2015-04-30 ENCOUNTER — Other Ambulatory Visit: Payer: Self-pay | Admitting: Cardiology

## 2015-05-14 ENCOUNTER — Ambulatory Visit (INDEPENDENT_AMBULATORY_CARE_PROVIDER_SITE_OTHER): Payer: Medicare Other | Admitting: Cardiology

## 2015-05-14 ENCOUNTER — Encounter: Payer: Self-pay | Admitting: Cardiology

## 2015-05-14 VITALS — BP 146/78 | HR 104 | Ht 61.0 in | Wt 165.1 lb

## 2015-05-14 DIAGNOSIS — R079 Chest pain, unspecified: Secondary | ICD-10-CM

## 2015-05-14 DIAGNOSIS — R0609 Other forms of dyspnea: Secondary | ICD-10-CM | POA: Diagnosis not present

## 2015-05-14 DIAGNOSIS — I119 Hypertensive heart disease without heart failure: Secondary | ICD-10-CM

## 2015-05-14 DIAGNOSIS — R002 Palpitations: Secondary | ICD-10-CM

## 2015-05-14 DIAGNOSIS — R0600 Dyspnea, unspecified: Secondary | ICD-10-CM

## 2015-05-14 MED ORDER — BISOPROLOL FUMARATE 5 MG PO TABS: ORAL_TABLET | ORAL | Status: DC

## 2015-05-14 MED ORDER — LOSARTAN POTASSIUM-HCTZ 50-12.5 MG PO TABS: 1.0000 | ORAL_TABLET | Freq: Two times a day (BID) | ORAL | Status: DC

## 2015-05-14 NOTE — Patient Instructions (Signed)
Medication Instructions:  START BISOPROLOL 5 MG 1/2 TABLET DAILY   Labwork: NONE  Testing/Procedures: NONE  Follow-Up: Your physician recommends that you schedule a follow-up appointment in: [place] G NP OR [facility] PA   If you need a refill on your cardiac medications before your next appointment, please call your pharmacy.

## 2015-05-14 NOTE — Progress Notes (Signed)
Cardiology Office Note   Date:  05/14/2015   ID:  Belinda [name], DOB [birth year], MRN [id number]  PCP:  Sheela Stack, MD  Cardiologist: Darlin Coco MD  Chief Complaint  Patient presents with  . Chest Pain      History of Present Illness: Belinda [name] is a 64 y.o. female who presents for follow-up visit  This pleasant 64 year old African American woman is seen for a scheduled 6 month followup office visit. She has a history of essential hypertension, hypercholesterolemia, insulin-dependent diabetes mellitus, and palpitations. She also has chronic back problems for which she is on medical disability. In July 2015 Dr. Ellene Route performed surgery on her cervical spine which went well. Her diabetes is followed closely by Dr. Forde Dandy. Since last visit she's had no new cardiac symptoms. She notes occasional chest pain relieved by sublingual nitroglycerin. She notes occasional premature beats.  She has a history of fluid retention and takes Demadex once or twice a week. She also takes hydrochlorothiazide in the form of Microzide. In July she had successful left knee replacement by Dr. Percell Miller Her blood pressure and her pulse rate had both been running slightly high. She states that in the hospital after her knee surgery she had to stay a total of 6 days in the hospital because of anemia and she required 2 units of packed cells.  Past Medical History  Diagnosis Date  . Hypertension   . Hyperlipidemia   . Back pain     intractable low back  . Palpitations   . Anemia     Hx. of  . Hx MRSA infection   . Osteoarthritis   . Anxiety   . GERD (gastroesophageal reflux disease)   . Migraines   . Dysrhythmia     palpitations or heart racing periodic. has had for years- Dr Mare Ferrari follows.  . Diabetes mellitus     insulin dependent - fasting 140-200 Diabetes Typle II    Past Surgical History  Procedure Laterality Date  . Other surgical history      c section x 1  .  Hand surgery      left hand  . Other surgical history      d&c x 2  . Nasal sinus surgery    . Abdominal hysterectomy    . Knee surgery Left     torn mensicus  . Eye surgery Bilateral     cataracts  . Anterior cervical decomp/discectomy fusion N/A 01/01/2014    Procedure: CERVICAL THREE-FOUR,CERVICAL FIVE-SIX,CERVICAL SIX-SEVEN ANTERIOR CERVICAL DECOMPRESSION/DISCECTOMY/FUSION;  Surgeon: Kristeen Miss, MD;  Location: [facility];  Service: Neurosurgery;  Laterality: N/A;  left-side approach  . Total knee arthroplasty Left 02/12/2015    Procedure: TOTAL KNEE ARTHROPLASTY;  Surgeon: [name], MD;  Location: [facility];  Service: Orthopedics;  Laterality: Left;     Current Outpatient Prescriptions  Medication Sig Dispense Refill  . acetaminophen (TYLENOL) 650 MG CR tablet Take 1,300 mg by mouth every 8 (eight) hours as needed for pain (pain).    [name] ALPRAZolam (XANAX) 0.25 MG tablet Take 0.25 mg by mouth at bedtime as needed for anxiety.    . Ascorbic Acid (VITAMIN C) 1000 MG tablet Take 1,000 mg by mouth at bedtime.    [name] aspirin 81 MG tablet Take 81 mg by mouth daily.    . Azelastine HCl 0.15 % SOLN Place 1 spray into both nostrils 2 (two) times daily.    . B-D INS SYR HALF-UNIT .3CC/31G 31G  X 5/16" 0.3 ML MISC 1 each by Other route See admin instructions. Use as directed 2 times daily with insulin    . Cholecalciferol (VITAMIN D-3) 5000 UNITS TABS Take 5,000 Units by mouth daily.    [name] ezetimibe-simvastatin (VYTORIN) 10-40 MG per tablet Take 1 tablet by mouth daily.    . ferrous gluconate (FERGON) 324 MG tablet Take 324 mg by mouth daily with breakfast.    . fexofenadine (ALLEGRA) 180 MG tablet Take 180 mg by mouth daily.    [name] guaiFENesin (MUCINEX) 600 MG 12 hr tablet Take 600 mg by mouth daily.    [name] HUMULIN R U-500 KWIKPEN 500 UNIT/ML kwikpen Inject 60 units into the skin daily in the morning. Inject 35 units into the skin daily at lunch time. Inject 30 units into the skin daily in the  evening.  6  . JARDIANCE 25 MG TABS tablet Take 25 mg by mouth daily.  6  . KLOR-CON 10 10 MEQ tablet TAKE 2 TABLETS BY MOUTH TWICE A DAY 120 tablet 6  . losartan-hydrochlorothiazide (HYZAAR) 50-12.5 MG tablet Take 1 tablet by mouth 2 (two) times daily. 180 tablet 3  . MICROZIDE 12.5 MG capsule TAKE 2 CAPSULES BY MOUTH ONCE DAILY 60 capsule 6  . Multiple Vitamins-Minerals (ALIVE WOMENS ENERGY PO) Take 1 tablet by mouth daily.    [name] NASONEX 50 MCG/ACT nasal spray Place 2 sprays into the nose at bedtime as needed (for allergies).     . nitroGLYCERIN (NITROSTAT) 0.4 MG SL tablet Place 0.4 mg under the tongue every 5 (five) minutes as needed for chest pain.    [name] omeprazole (PRILOSEC) 20 MG capsule Take 20 mg by mouth at bedtime as needed (for acid reflux).     . ONE TOUCH ULTRA TEST test strip 1 each by Other route See admin instructions. Check blood sugar 3 times daily    . OVER THE COUNTER MEDICATION Take 1 tablet by mouth 2 (two) times daily. Menorelief  tabs    . OVER THE COUNTER MEDICATION Take 1 tablet by mouth 2 (two) times daily. isoflabones 40 mg for hot flashes    . OVER THE COUNTER MEDICATION Take 1 tablet by mouth 2 (two) times daily. Raymond -- Triple Joint Relief    . oxyCODONE-acetaminophen (PERCOCET/ROXICET) 5-325 MG tablet Take 1 tablet by mouth every 6 (six) hours as needed for severe pain (pain).    . torsemide (DEMADEX) 20 MG tablet Take 1 tablet (20 mg total) by mouth daily as needed (for fluid). 30 tablet 11  . vitamin E 400 UNIT capsule Take 400 Units by mouth daily.    . bisoprolol (ZEBETA) 5 MG tablet 1/2 TABLET BY MOUTH DAILY 15 tablet 6   No current facility-administered medications for this visit.    Allergies:   Aspirin; Augmentin; Erythromycin; Nickel; and Other    Social History:  The patient  reports that she has never smoked. She does not have any smokeless tobacco history on file. She reports that she does not drink alcohol or use illicit drugs.    Family History:  The patient's family history includes Heart disease in her mother; Heart failure in her father.    ROS:  Please see the history of present illness.   Otherwise, review of systems are positive for none.   All other systems are reviewed and negative.    PHYSICAL EXAM: VS:  BP 146/78 mmHg  Pulse 104  Ht 5\' 1"  (1.549 m)  Wt 165  lb 1.9 oz (74.898 kg)  BMI 31.22 kg/m2 , BMI Body mass index is 31.22 kg/(m^2). GEN: Well nourished, well developed, in no acute distress HEENT: normal Neck: no JVD, carotid bruits, or masses Cardiac: RRR; no murmurs, rubs, or gallops,no edema  Respiratory:  clear to auscultation bilaterally, normal work of breathing GI: soft, nontender, nondistended, + BS MS: no deformity or atrophy Skin: warm and dry, no rash Neuro:  Strength and sensation are intact Psych: euthymic mood, full affect   EKG:  EKG is ordered today. The ekg ordered today demonstrates sinus tachycardia.  No ischemic changes.  Heart rate is 104 bpm   Recent Labs: 01/31/2015: ALT 21 02/15/2015: BUN 17; Creatinine, Ser 1.13*; Hemoglobin 11.0*; Platelets 151; Potassium 4.0; Sodium 134*    Lipid Panel No results found for: CHOL, TRIG, HDL, CHOLHDL, VLDL, LDLCALC, LDLDIRECT    Wt Readings from Last 3 Encounters:  05/14/15 165 lb 1.9 oz (74.898 kg)  02/12/15 165 lb 2 oz (74.9 kg)  02/06/15 164 lb (74.39 kg)          ASSESSMENT AND PLAN:  1. essential hypertension 2. Hypercholesterolemia 3. chronic low back pain on disability 4. diabetes mellitus adult onset insulin-dependent. 5. Palpitations. Occasional PVCs are noted on EKG today 6. Osteoarthritis of left knee. Possible need for total knee replacement this summer. From a cardiology standpoint she is an acceptable risk for surgery.   Current medicines are reviewed at length with the patient today.  The patient does not have concerns regarding medicines.  The following changes have been made:  We are going to  start her on bisoprolol 5 mg tablets taking one half tablet daily to help with blood pressure and heart rate control  Labs/ tests ordered today include:   Orders Placed This Encounter  Procedures  . EKG 12-Lead     Disposition:   Recheck in 4 months for follow-up office visit and EKG with Cecille Rubin or [employer].  Berna Spare MD 05/14/2015 5:45 PM    [address], [place], [place]  [address] Phone: [phone]; Fax: [phone]

## 2015-05-29 ENCOUNTER — Other Ambulatory Visit: Payer: Self-pay

## 2015-05-29 DIAGNOSIS — Z1231 Encounter for screening mammogram for malignant neoplasm of breast: Secondary | ICD-10-CM

## 2015-06-03 ENCOUNTER — Other Ambulatory Visit: Payer: Self-pay | Admitting: Neurological Surgery

## 2015-06-03 DIAGNOSIS — M4310 Spondylolisthesis, site unspecified: Secondary | ICD-10-CM

## 2015-06-17 ENCOUNTER — Ambulatory Visit
Admission: RE | Admit: 2015-06-17 | Discharge: 2015-06-17 | Disposition: A | Discharge status: Home / Self Care | Payer: Medicare Other | Source: Ambulatory Visit | Attending: Neurological Surgery | Admitting: Neurological Surgery

## 2015-06-17 DIAGNOSIS — M4310 Spondylolisthesis, site unspecified: Secondary | ICD-10-CM

## 2015-07-08 ENCOUNTER — Ambulatory Visit: Payer: Medicare Other

## 2015-07-24 ENCOUNTER — Ambulatory Visit
Admission: RE | Admit: 2015-07-24 | Discharge: 2015-07-24 | Disposition: A | Discharge status: Home / Self Care | Payer: Medicare Other | Source: Ambulatory Visit

## 2015-07-24 DIAGNOSIS — Z1231 Encounter for screening mammogram for malignant neoplasm of breast: Secondary | ICD-10-CM

## 2015-09-23 ENCOUNTER — Ambulatory Visit (INDEPENDENT_AMBULATORY_CARE_PROVIDER_SITE_OTHER): Payer: Medicare Other | Admitting: Nurse Practitioner

## 2015-09-23 ENCOUNTER — Encounter: Payer: Self-pay | Admitting: Nurse Practitioner

## 2015-09-23 VITALS — BP 160/80 | HR 84 | Ht 61.0 in | Wt 166.0 lb

## 2015-09-23 DIAGNOSIS — I119 Hypertensive heart disease without heart failure: Secondary | ICD-10-CM

## 2015-09-23 DIAGNOSIS — E785 Hyperlipidemia, unspecified: Secondary | ICD-10-CM

## 2015-09-23 NOTE — Progress Notes (Signed)
CARDIOLOGY OFFICE NOTE  Date:  09/23/2015    Belinda [name] Date of Birth: 1950/12/31 Medical Record [id number]  PCP:  Sheela Stack, MD  Cardiologist:  Former patient of Dr. Sherryl Barters    Chief Complaint  Patient presents with  . Hypertension  . Hyperlipidemia    4 month check - former patient of Dr. Sherryl Barters    History of Present Illness: Belinda [name] is a 65 y.o. female who presents today for a 4 month check. Former patient of Dr. Sherryl Barters.  She has a history of essential hypertension, hypercholesterolemia, insulin-dependent diabetes mellitus, and palpitations. She also has chronic back problems for which she is on medical disability. In July 2015 Dr. Ellene Route performed surgery on her cervical spine which went well. Her diabetes is followed closely by Dr. Forde Dandy. She has a history of fluid retention and takes Demadex once or twice a week.  Last seen back in November - cardiac status seemed to be stable.  Comes back today. Here alone. Says she is doing ok. Very limited activity. No chest pain. Does have some stable DOE. Mostly limited by her back and knee. Says her BP is much lower at home - sometimes down in the [id number] systolic. Labs are checked by PCP and says she has just had some done. Using NSAIDs for pain.  Past Medical History  Diagnosis Date  . Hypertension   . Hyperlipidemia   . Back pain     intractable low back  . Palpitations   . Anemia     Hx. of  . Hx MRSA infection   . Osteoarthritis   . Anxiety   . GERD (gastroesophageal reflux disease)   . Migraines   . Dysrhythmia     palpitations or heart racing periodic. has had for years- Dr Mare Ferrari follows.  . Diabetes mellitus     insulin dependent - fasting 140-200 Diabetes Typle II    Past Surgical History  Procedure Laterality Date  . Other surgical history      c section x 1  . Hand surgery      left hand  . Other surgical history      d&c x 2  . Nasal sinus surgery    .  Abdominal hysterectomy    . Knee surgery Left     torn mensicus  . Eye surgery Bilateral     cataracts  . Anterior cervical decomp/discectomy fusion N/A 01/01/2014    Procedure: CERVICAL THREE-FOUR,CERVICAL FIVE-SIX,CERVICAL SIX-SEVEN ANTERIOR CERVICAL DECOMPRESSION/DISCECTOMY/FUSION;  Surgeon: Kristeen Miss, MD;  Location: [facility];  Service: Neurosurgery;  Laterality: N/A;  left-side approach  . Total knee arthroplasty Left 02/12/2015    Procedure: TOTAL KNEE ARTHROPLASTY;  Surgeon: [name], MD;  Location: [facility];  Service: Orthopedics;  Laterality: Left;     Medications: Current Outpatient Prescriptions  Medication Sig Dispense Refill  . acetaminophen (TYLENOL) 650 MG CR tablet Take 1,300 mg by mouth every 8 (eight) hours as needed for pain (pain).    [name] ALPRAZolam (XANAX) 0.25 MG tablet Take 0.25 mg by mouth at bedtime as needed for anxiety.    . Ascorbic Acid (VITAMIN C) 1000 MG tablet Take 1,000 mg by mouth at bedtime.    [name] aspirin 81 MG tablet Take 81 mg by mouth daily.    . Azelastine HCl 0.15 % SOLN Place 1 spray into both nostrils 2 (two) times daily.    . B-D INS SYR HALF-UNIT .3CC/31G 31G X 5/16" 0.3 ML  MISC 1 each by Other route See admin instructions. Use as directed 2 times daily with insulin    . bisoprolol (ZEBETA) 5 MG tablet 1/2 TABLET BY MOUTH DAILY 15 tablet 6  . Cholecalciferol (VITAMIN D-3) 5000 UNITS TABS Take 5,000 Units by mouth daily.    . clindamycin (CLEOCIN) 300 MG capsule TAKE 2CAPSULES PO 1 HR PRIOR TO DENTAL WORK  0  . ezetimibe-simvastatin (VYTORIN) 10-40 MG per tablet Take 1 tablet by mouth daily.    . ferrous gluconate (FERGON) 324 MG tablet Take 324 mg by mouth daily with breakfast.    . fexofenadine (ALLEGRA) 180 MG tablet Take 180 mg by mouth daily.    [name] guaiFENesin (MUCINEX) 600 MG 12 hr tablet Take 600 mg by mouth daily.    [name] HUMULIN R U-500 KWIKPEN 500 UNIT/ML kwikpen Inject 60 units into the skin daily in the morning. Inject 35 units into  the skin daily at lunch time. Inject 30 units into the skin daily in the evening.  6  . JARDIANCE 25 MG TABS tablet Take 25 mg by mouth daily.  6  . KLOR-CON 10 10 MEQ tablet TAKE 2 TABLETS BY MOUTH TWICE A DAY 120 tablet 6  . losartan-hydrochlorothiazide (HYZAAR) 50-12.5 MG tablet Take 1 tablet by mouth 2 (two) times daily. 180 tablet 3  . MICROZIDE 12.5 MG capsule TAKE 2 CAPSULES BY MOUTH ONCE DAILY 60 capsule 6  . montelukast (SINGULAIR) 10 MG tablet Take 10 mg by mouth daily.  5  . Multiple Vitamins-Minerals (ALIVE WOMENS ENERGY PO) Take 1 tablet by mouth daily.    [name] NASONEX 50 MCG/ACT nasal spray Place 2 sprays into the nose at bedtime as needed (for allergies).     . nitroGLYCERIN (NITROSTAT) 0.4 MG SL tablet Place 0.4 mg under the tongue every 5 (five) minutes as needed for chest pain.    [name] omeprazole (PRILOSEC) 20 MG capsule Take 20 mg by mouth at bedtime as needed (for acid reflux).     . ONE TOUCH ULTRA TEST test strip 1 each by Other route See admin instructions. Check blood sugar 3 times daily    . OVER THE COUNTER MEDICATION Take 1 tablet by mouth 2 (two) times daily. Menorelief  tabs    . OVER THE COUNTER MEDICATION Take 1 tablet by mouth 2 (two) times daily. isoflabones 40 mg for hot flashes    . OVER THE COUNTER MEDICATION Take 1 tablet by mouth 2 (two) times daily. [place] -- Triple Joint Relief    . oxyCODONE-acetaminophen (PERCOCET/ROXICET) 5-325 MG tablet Take 1 tablet by mouth every 6 (six) hours as needed for severe pain (pain).    . torsemide (DEMADEX) 20 MG tablet Take 1 tablet (20 mg total) by mouth daily as needed (for fluid). 30 tablet 11  . vitamin E 400 UNIT capsule Take 400 Units by mouth daily.     No current facility-administered medications for this visit.    Allergies: Allergies  Allergen Reactions  . Aspirin Other (See Comments)    Can tolerate low aspirin  . Augmentin [Amoxicillin-Pot Clavulanate] Other (See Comments)    GI issues  .  Erythromycin Other (See Comments)    Gi issues  . Nickel Itching and Rash    Breakouts   . Other Other (See Comments)    Omnicef-GI issues    Social History: The patient  reports that she has never smoked. She does not have any smokeless tobacco history on file. She reports  that she does not drink alcohol or use illicit drugs.   Family History: The patient's family history includes Heart disease in her mother; Heart failure in her father.   Review of Systems: Please see the history of present illness.   Otherwise, the review of systems is positive for none.   All other systems are reviewed and negative.   Physical Exam: VS:  BP 160/80 mmHg  Pulse 84  Ht 5\' 1"  (1.549 m)  Wt 166 lb (75.297 kg)  BMI 31.38 kg/m2 .  BMI Body mass index is 31.38 kg/(m^2).  Wt Readings from Last 3 Encounters:  09/23/15 166 lb (75.297 kg)  05/14/15 165 lb 1.9 oz (74.898 kg)  02/12/15 165 lb 2 oz (74.9 kg)    General: Pleasant. Obese black female who is alert and in no acute distress.  HEENT: Normal. Neck: Supple, no JVD, carotid bruits, or masses noted.  Cardiac: Regular rate and rhythm. Heart tones are distant. No edema.  Respiratory:  Lungs are clear to auscultation bilaterally with normal work of breathing.  GI: Soft and nontender.  MS: No deformity or atrophy. Gait and ROM intact. Skin: Warm and dry. Color is normal.  Neuro:  Strength and sensation are intact and no gross focal deficits noted.  Psych: Alert, appropriate and with normal affect.   LABORATORY DATA:  EKG:  EKG is not ordered today.  Lab Results  Component Value Date   WBC 12.8* 02/15/2015   HGB 11.0* 02/15/2015   HCT 31.5* 02/15/2015   PLT 151 02/15/2015   GLUCOSE 178* 02/15/2015   ALT 21 01/31/2015   AST 23 01/31/2015   NA 134* 02/15/2015   K 4.0 02/15/2015   CL 101 02/15/2015   CREATININE 1.13* 02/15/2015   BUN 17 02/15/2015   CO2 22 02/15/2015   INR 0.92 01/31/2015   HGBA1C 11.1* 01/31/2015    BNP (last 3  results) No results for input(s): BNP in the last 8760 hours.  ProBNP (last 3 results) No results for input(s): PROBNP in the last 8760 hours.   Other Studies Reviewed Today: Myoview Study Highlights from 01/2015     Nuclear stress EF: 70%.  There was no ST segment deviation noted during stress.  The study is normal.  This is a low risk study.  The left ventricular ejection fraction is hyperdynamic (>65%).  no evidence of ischemia.     Assessment/Plan: 1. HTN - recheck by me is 154/100 - she is pretty adamant that she has good outpatient control at home - even readings down in the [id number] systolic. She will continue to monitor. I have left her on her current regimen for now.   2. Hypercholesterolemia - her labs are checked by PCP  3. Chronic low back pain on disability - this greatly impacts her overall health.   4. IDDM.  5. Palpitations with prior noted PVCs - not currently a problem.   Current medicines are reviewed with the patient today.  The patient does not have concerns regarding medicines other than what has been noted above.  The following changes have been made:  See above.  Labs/ tests ordered today include:   No orders of the defined types were placed in this encounter.     Disposition:   FU with Dr. Acie Fredrickson going forward.   Patient is agreeable to this plan and will call if any problems develop in the interim.   Signed: Burtis Junes, RN, ANP-C 09/23/2015 2:48 PM  [facility]  [address] [place], [place]  [address] Phone: [phone] Fax: [phone]

## 2015-09-23 NOTE — Patient Instructions (Addendum)
We will be checking the following labs today - NONE  Medication Instructions:    Continue with your current medicines.     Testing/Procedures To Be Arranged:  N/A  Follow-Up:   See Dr. Acie Fredrickson in 6 months    Other Special Instructions:   Try to get more activity    If you need a refill on your cardiac medications before your next appointment, please call your pharmacy.   Call the [place] office at [phone] if you have any questions, problems or concerns.

## 2015-12-17 ENCOUNTER — Other Ambulatory Visit: Payer: Self-pay

## 2015-12-17 ENCOUNTER — Other Ambulatory Visit: Payer: Self-pay | Admitting: *Deleted

## 2015-12-17 MED ORDER — POTASSIUM CHLORIDE ER 10 MEQ PO TBCR: 20.0000 meq | EXTENDED_RELEASE_TABLET | Freq: Two times a day (BID) | ORAL | Status: DC

## 2015-12-17 MED ORDER — MICROZIDE 12.5 MG PO CAPS: 25.0000 mg | ORAL_CAPSULE | Freq: Every day | ORAL | Status: DC

## 2015-12-17 NOTE — Telephone Encounter (Signed)
Belinda Junes, NP at 09/23/2015 2:37 PM  MICROZIDE 12.5 MG capsuleTAKE 2 CAPSULES BY MOUTH ONCE DAILY Medication Instructions:    Continue with your current medicines

## 2016-01-19 ENCOUNTER — Other Ambulatory Visit: Payer: Self-pay

## 2016-01-20 ENCOUNTER — Telehealth: Payer: Self-pay | Admitting: Interventional Cardiology

## 2016-01-20 MED ORDER — TORSEMIDE 20 MG PO TABS: 20.0000 mg | ORAL_TABLET | Freq: Every day | ORAL | 0 refills | Status: DC | PRN

## 2016-01-20 MED ORDER — BISOPROLOL FUMARATE 5 MG PO TABS: ORAL_TABLET | ORAL | 0 refills | Status: DC

## 2016-01-20 NOTE — Telephone Encounter (Signed)
Pharmacy requesting a refill on torsemide 29 mg tablet taken daily and Bisoprolol fumarate 5 mg tablet taken 1/2 tablet by mouth daily. Prior Dr. Mare Ferrari pt, has an appointment on 01/29/16 with Dr. Irish Lack. Would you like to refill medication? Please advise

## 2016-01-20 NOTE — Telephone Encounter (Signed)
The pt will probably need meds prior to his OV. If he does please fill. Thanks.

## 2016-01-22 NOTE — Telephone Encounter (Signed)
Yes, may refill

## 2016-02-19 ENCOUNTER — Other Ambulatory Visit: Payer: Self-pay | Admitting: Interventional Cardiology

## 2016-03-18 ENCOUNTER — Encounter: Payer: Self-pay | Admitting: Cardiovascular Disease

## 2016-04-02 ENCOUNTER — Encounter: Payer: Medicare Other | Admitting: Cardiovascular Disease

## 2016-04-18 ENCOUNTER — Other Ambulatory Visit: Payer: Self-pay | Admitting: Nurse Practitioner

## 2016-05-18 ENCOUNTER — Encounter: Payer: Self-pay | Admitting: Cardiovascular Disease

## 2016-05-18 ENCOUNTER — Ambulatory Visit (INDEPENDENT_AMBULATORY_CARE_PROVIDER_SITE_OTHER): Payer: Medicare Other | Admitting: Cardiovascular Disease

## 2016-05-18 VITALS — BP 140/90 | HR 93 | Ht 61.0 in | Wt 171.1 lb

## 2016-05-18 DIAGNOSIS — R002 Palpitations: Secondary | ICD-10-CM | POA: Diagnosis not present

## 2016-05-18 DIAGNOSIS — I10 Essential (primary) hypertension: Secondary | ICD-10-CM

## 2016-05-18 NOTE — Progress Notes (Signed)
CARDIOLOGY OFFICE NOTE  Date:  05/18/2016    Belinda [name] Date of Birth: 1950/08/10 Medical Record #[id number]  PCP:  Sheela Stack, MD  Cardiologist:  Former patient of Dr. Sherryl Barters, now  [place]   Chief Complaint  Patient presents with  . Palpitations  . swelling in LFT leg & ankle   Problem list 1. Hypertension 2. Hyperlipidemia 3. Diabetes mellitus 4. Palpitations 5. History of back pain   Notes from Truitt Merle, NP, 09/23/15  Belinda [name] is a 65 y.o. female who presents today for a 4 month check. Former patient of Dr. Sherryl Barters.  She has a history of essential hypertension, hypercholesterolemia, insulin-dependent diabetes mellitus, and palpitations. She also has chronic back problems for which she is on medical disability. In July 2015 Dr. Ellene Route performed surgery on her cervical spine which went well. Her diabetes is followed closely by Dr. Forde Dandy. She has a history of fluid retention and takes Demadex once or twice a week.  Last seen back in November - cardiac status seemed to be stable.  Comes back today. Here alone. Says she is doing ok. Very limited activity. No chest pain. Does have some stable DOE. Mostly limited by her back and knee. Says her BP is much lower at home - sometimes down in the 29'B systolic. Labs are checked by PCP and says she has just had some done. Using NSAIDs for pain.  Nov. 21, 2017:  Doing well,   Seen for the first time today ,  Transfer from [place] Rare episodes of chest tightness.   Typically related to stress.  BP is a bit high today.   She insistes that it is normal at home. 134 / 76 last night   Right arm 140/90  Left arem 128/70 Watches her salt intake. Walks on a stepper machine for exercise.  Past Medical History:  Diagnosis Date  . Anemia    Hx. of  . Anxiety   . Back pain    intractable low back  . 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 Dysrhythmia     palpitations or heart racing periodic. has had for years- Dr Mare Ferrari follows.  [name] GERD (gastroesophageal reflux disease)   . Hx MRSA infection   . Hyperlipidemia   . Hypertension   . Migraines   . Osteoarthritis   . Palpitations     Past Surgical History:  Procedure Laterality Date  . ABDOMINAL HYSTERECTOMY    . ANTERIOR CERVICAL DECOMP/DISCECTOMY FUSION N/A 01/01/2014   Procedure: CERVICAL THREE-FOUR,CERVICAL FIVE-SIX,CERVICAL SIX-SEVEN ANTERIOR CERVICAL DECOMPRESSION/DISCECTOMY/FUSION;  Surgeon: Kristeen Miss, MD;  Location: [facility];  Service: Neurosurgery;  Laterality: N/A;  left-side approach  . EYE SURGERY Bilateral    cataracts  . HAND SURGERY     left hand  . KNEE SURGERY Left    torn mensicus  . NASAL SINUS SURGERY    . OTHER SURGICAL HISTORY     c section x 1  . OTHER SURGICAL HISTORY     d&c x 2  . TOTAL KNEE ARTHROPLASTY Left 02/12/2015   Procedure: TOTAL KNEE ARTHROPLASTY;  Surgeon: [name], MD;  Location: [facility];  Service: Orthopedics;  Laterality: Left;     Medications: Current Outpatient Prescriptions  Medication Sig Dispense Refill  . acetaminophen (TYLENOL) 650 MG CR tablet Take 1,300 mg by mouth every 8 (eight) hours as needed for pain (pain).    [name] ALPRAZolam (XANAX) 0.25 MG  tablet Take 0.25 mg by mouth at bedtime as needed for anxiety.    . Ascorbic Acid (VITAMIN C) 1000 MG tablet Take 1,000 mg by mouth at bedtime.    [name] aspirin 81 MG tablet Take 81 mg by mouth daily.    . Azelastine HCl 0.15 % SOLN Place 1 spray into both nostrils 2 (two) times daily.    . B-D INS SYR HALF-UNIT .3CC/31G 31G X 5/16" 0.3 ML MISC 1 each by Other route See admin instructions. Use as directed 2 times daily with insulin    . bisoprolol (ZEBETA) 5 MG tablet 1/2 TABLET BY MOUTH DAILY 45 tablet 2  . Cholecalciferol (VITAMIN D-3) 5000 UNITS TABS Take 5,000 Units by mouth daily.    . clindamycin (CLEOCIN) 300 MG capsule TAKE 2CAPSULES PO 1 HR PRIOR TO DENTAL WORK  0  .  ezetimibe-simvastatin (VYTORIN) 10-40 MG per tablet Take 1 tablet by mouth daily.    . ferrous gluconate (FERGON) 324 MG tablet Take 324 mg by mouth daily with breakfast.    . fexofenadine (ALLEGRA) 180 MG tablet Take 180 mg by mouth daily.    [name] guaiFENesin (MUCINEX) 600 MG 12 hr tablet Take 600 mg by mouth daily.    [name] HUMULIN R U-500 KWIKPEN 500 UNIT/ML kwikpen Inject 60 units into the skin daily in the morning. Inject 35 units into the skin daily at lunch time. Inject 30 units into the skin daily in the evening.  6  . irbesartan-hydrochlorothiazide (AVALIDE) 150-12.5 MG tablet Take 1 tablet by mouth 2 (two) times daily.    [name] MICROZIDE 12.5 MG capsule TAKE 2 CAPSULES (25 MG TOTAL) BY MOUTH DAILY. 60 capsule 3  . Multiple Vitamins-Minerals (ALIVE WOMENS ENERGY PO) Take 1 tablet by mouth daily.    [name] NASONEX 50 MCG/ACT nasal spray Place 2 sprays into the nose at bedtime as needed (for allergies).     . nitroGLYCERIN (NITROSTAT) 0.4 MG SL tablet Place 0.4 mg under the tongue every 5 (five) minutes as needed for chest pain.    [name] omeprazole (PRILOSEC) 20 MG capsule Take 20 mg by mouth at bedtime as needed (for acid reflux).     . ONE TOUCH ULTRA TEST test strip 1 each by Other route See admin instructions. Check blood sugar 3 times daily    . OVER THE COUNTER MEDICATION Take 1 tablet by mouth 2 (two) times daily. Menorelief  tabs    . OVER THE COUNTER MEDICATION Take 1 tablet by mouth 2 (two) times daily. isoflabones 40 mg for hot flashes    . OVER THE COUNTER MEDICATION Take 1 tablet by mouth 2 (two) times daily. [place] -- Triple Joint Relief    . oxyCODONE-acetaminophen (PERCOCET/ROXICET) 5-325 MG tablet Take 1 tablet by mouth every 6 (six) hours as needed for severe pain (pain).    . potassium chloride (KLOR-CON 10) 10 MEQ tablet Take 2 tablets (20 mEq total) by mouth 2 (two) times daily. 360 tablet 2  . torsemide (DEMADEX) 20 MG tablet Take 1 tablet (20 mg total) by mouth daily as needed  (for fluid). 30 tablet 0  . TRULICITY 1.5 QM/5.7QI SOPN Inject 1.5 mg as directed once a week.    . vitamin E 400 UNIT capsule Take 400 Units by mouth daily.     No current facility-administered medications for this visit.     Allergies: Allergies  Allergen Reactions  . Aspirin Other (See Comments)    Can tolerate low aspirin  .  Augmentin [Amoxicillin-Pot Clavulanate] Other (See Comments)    GI issues  . Erythromycin Other (See Comments)    Gi issues  . Nickel Itching and Rash    Breakouts   . Other Other (See Comments)    Omnicef-GI issues    Social History: The patient  reports that she has never smoked. She has never used smokeless tobacco. She reports that she does not drink alcohol or use drugs.   Family History: The patient's family history includes Heart disease in her mother; Heart failure in her father.   Review of Systems: Please see the history of present illness.   Otherwise, the review of systems is positive for none.   All other systems are reviewed and negative.   Physical Exam: VS:  BP (!) 150/80   Pulse 93   Ht 5\' 1"  (1.549 m)   Wt 171 lb 1.9 oz (77.6 kg)   SpO2 98%   BMI 32.33 kg/m  .  BMI Body mass index is 32.33 kg/m.  Wt Readings from Last 3 Encounters:  05/18/16 171 lb 1.9 oz (77.6 kg)  09/23/15 166 lb (75.3 kg)  05/14/15 165 lb 1.9 oz (74.9 kg)    General: Pleasant. Obese black female who is alert and in no acute distress.   HEENT: Normal.  Neck: Supple, no JVD, carotid bruits, or masses noted.  Cardiac: Regular rate and rhythm. Heart tones are distant. No edema.  Respiratory:  Lungs are clear to auscultation bilaterally with normal work of breathing.  GI: Soft and nontender.  MS: No deformity or atrophy. Gait and ROM intact.  Skin: Warm and dry. Color is normal.  Neuro:  Strength and sensation are intact and no gross focal deficits noted.  Psych: Alert, appropriate and with normal affect.   LABORATORY DATA:  EKG:  EKG is ordered  today.  Nov. 21, 2017:   NSR at 94.   Prolonged QTC  Lab Results  Component Value Date   WBC 12.8 (H) 02/15/2015   HGB 11.0 (L) 02/15/2015   HCT 31.5 (L) 02/15/2015   PLT 151 02/15/2015   GLUCOSE 178 (H) 02/15/2015   ALT 21 01/31/2015   AST 23 01/31/2015   NA 134 (L) 02/15/2015   K 4.0 02/15/2015   CL 101 02/15/2015   CREATININE 1.13 (H) 02/15/2015   BUN 17 02/15/2015   CO2 22 02/15/2015   INR 0.92 01/31/2015   HGBA1C 11.1 (H) 01/31/2015    BNP (last 3 results) No results for input(s): BNP in the last 8760 hours.  ProBNP (last 3 results) No results for input(s): PROBNP in the last 8760 hours.   Other Studies Reviewed Today: Myoview Study Highlights from 01/2015    1.  HTN:    BP is ok Advised her to watch her salt.    Continue to exercise .  I will see her in 1 year for follow up .  2. Hypercholesterolemia - her labs are checked by PCP  3. Chronic low back pain on disability - this greatly impacts her overall health.   4. IDDM.  5. Palpitations with prior noted PVCs - not currently a problem.   Current medicines are reviewed with the patient today.  The patient does not have concerns regarding medicines other than what has been noted above.  The following changes have been made:  See above.  Labs/ tests ordered today include:    Orders Placed This Encounter  Procedures  . EKG 12-Lead     Mertie Moores,  MD  05/18/2016 3:19 PM    [address],  [place], [place]  [address] Pager [phone] Phone: [phone]; Fax: [phone]

## 2016-05-18 NOTE — Patient Instructions (Signed)
Your physician recommends that you continue on your current medications as directed. Please refer to the Current Medication list given to you today.  Your physician wants you to follow-up in: 1 year with Dr. Nahser.  You will receive a reminder letter in the mail two months in advance. If you don't receive a letter, please call our office to schedule the follow-up appointment.  

## 2016-06-05 ENCOUNTER — Other Ambulatory Visit: Payer: Self-pay | Admitting: Interventional Cardiology

## 2016-08-10 ENCOUNTER — Other Ambulatory Visit: Payer: Self-pay | Admitting: Nurse Practitioner

## 2016-09-07 ENCOUNTER — Other Ambulatory Visit: Payer: Self-pay | Admitting: Cardiovascular Disease

## 2017-01-27 ENCOUNTER — Other Ambulatory Visit: Payer: Self-pay | Admitting: Nephrology

## 2017-01-27 DIAGNOSIS — N183 Chronic kidney disease, stage 3 unspecified: Secondary | ICD-10-CM

## 2017-02-03 ENCOUNTER — Ambulatory Visit
Admission: RE | Admit: 2017-02-03 | Discharge: 2017-02-03 | Disposition: A | Discharge status: Home / Self Care | Payer: Medicare Other | Source: Ambulatory Visit | Attending: Nephrology | Admitting: Nephrology

## 2017-02-03 DIAGNOSIS — N183 Chronic kidney disease, stage 3 unspecified: Secondary | ICD-10-CM

## 2017-02-10 ENCOUNTER — Other Ambulatory Visit (HOSPITAL_COMMUNITY): Payer: Self-pay | Admitting: *Deleted

## 2017-02-11 ENCOUNTER — Ambulatory Visit (HOSPITAL_COMMUNITY)
Admission: RE | Admit: 2017-02-11 | Discharge: 2017-02-11 | Disposition: A | Discharge status: Home / Self Care | Payer: Medicare Other | Source: Ambulatory Visit | Attending: Nephrology | Admitting: Nephrology

## 2017-02-11 ENCOUNTER — Encounter: Payer: Self-pay | Admitting: Gastroenterology

## 2017-02-11 DIAGNOSIS — D631 Anemia in chronic kidney disease: Secondary | ICD-10-CM | POA: Insufficient documentation

## 2017-02-11 LAB — POCT HEMOGLOBIN-HEMACUE: Hemoglobin: 7.4 g/dL — ABNORMAL LOW (ref 12.0–15.0)

## 2017-02-11 MED ORDER — DARBEPOETIN ALFA 100 MCG/0.5ML IJ SOSY
100.0000 ug | PREFILLED_SYRINGE | INTRAMUSCULAR | Status: DC
  Administered 2017-02-11: 100 ug via SUBCUTANEOUS

## 2017-02-11 MED ORDER — DARBEPOETIN ALFA 100 MCG/0.5ML IJ SOSY
PREFILLED_SYRINGE | INTRAMUSCULAR | Status: AC
  Filled 2017-02-11: qty 0.5

## 2017-02-11 NOTE — Discharge Instructions (Signed)

## 2017-03-11 ENCOUNTER — Inpatient Hospital Stay (HOSPITAL_COMMUNITY): Admission: RE | Admit: 2017-03-11 | Payer: Medicare Other | Source: Ambulatory Visit

## 2017-03-15 ENCOUNTER — Ambulatory Visit (HOSPITAL_COMMUNITY)
Admission: RE | Admit: 2017-03-15 | Discharge: 2017-03-15 | Disposition: A | Discharge status: Home / Self Care | Payer: Medicare Other | Source: Ambulatory Visit | Attending: Nephrology | Admitting: Nephrology

## 2017-03-15 DIAGNOSIS — N183 Chronic kidney disease, stage 3 unspecified: Secondary | ICD-10-CM

## 2017-03-15 DIAGNOSIS — D631 Anemia in chronic kidney disease: Secondary | ICD-10-CM | POA: Diagnosis present

## 2017-03-15 LAB — IRON AND TIBC
Iron: 60 ug/dL (ref 28–170)
Saturation Ratios: 18 % (ref 10.4–31.8)
TIBC: 328 ug/dL (ref 250–450)
UIBC: 268 ug/dL

## 2017-03-15 LAB — POCT HEMOGLOBIN-HEMACUE: Hemoglobin: 8.3 g/dL — ABNORMAL LOW (ref 12.0–15.0)

## 2017-03-15 LAB — FERRITIN: Ferritin: 300 ng/mL (ref 11–307)

## 2017-03-15 MED ORDER — DARBEPOETIN ALFA 100 MCG/0.5ML IJ SOSY
PREFILLED_SYRINGE | INTRAMUSCULAR | Status: AC
  Filled 2017-03-15: qty 0.5

## 2017-03-15 MED ORDER — DARBEPOETIN ALFA 100 MCG/0.5ML IJ SOSY
100.0000 ug | PREFILLED_SYRINGE | INTRAMUSCULAR | Status: DC
Start: 2017-03-15 — End: 2017-03-16
  Administered 2017-03-15: 100 ug via SUBCUTANEOUS

## 2017-03-22 ENCOUNTER — Telehealth: Payer: Self-pay | Admitting: Internal Medicine

## 2017-03-22 NOTE — Telephone Encounter (Signed)
New message    Pt requesting switch from Dr Acie Fredrickson to Dr Debara Pickett due to personal reason please advise

## 2017-03-22 NOTE — Telephone Encounter (Signed)
That will be fine with me. 

## 2017-03-23 NOTE — Telephone Encounter (Signed)
Ok with me.  Dr H 

## 2017-04-05 ENCOUNTER — Ambulatory Visit (INDEPENDENT_AMBULATORY_CARE_PROVIDER_SITE_OTHER): Payer: Medicare Other | Admitting: Gastroenterology

## 2017-04-05 ENCOUNTER — Encounter: Payer: Self-pay | Admitting: Gastroenterology

## 2017-04-05 ENCOUNTER — Other Ambulatory Visit (INDEPENDENT_AMBULATORY_CARE_PROVIDER_SITE_OTHER): Payer: Medicare Other

## 2017-04-05 VITALS — BP 148/78 | HR 72 | Ht 61.0 in | Wt 155.0 lb

## 2017-04-05 DIAGNOSIS — R1013 Epigastric pain: Secondary | ICD-10-CM | POA: Diagnosis not present

## 2017-04-05 DIAGNOSIS — N184 Chronic kidney disease, stage 4 (severe): Secondary | ICD-10-CM | POA: Diagnosis not present

## 2017-04-05 DIAGNOSIS — K219 Gastro-esophageal reflux disease without esophagitis: Secondary | ICD-10-CM

## 2017-04-05 DIAGNOSIS — D649 Anemia, unspecified: Secondary | ICD-10-CM

## 2017-04-05 LAB — BASIC METABOLIC PANEL
BUN: 37 mg/dL — ABNORMAL HIGH (ref 6–23)
CO2: 27 mEq/L (ref 19–32)
Calcium: 9.6 mg/dL (ref 8.4–10.5)
Chloride: 102 mEq/L (ref 96–112)
Creatinine, Ser: 1.97 mg/dL — ABNORMAL HIGH (ref 0.40–1.20)
GFR: 32.64 mL/min — ABNORMAL LOW (ref >60.00)
Glucose, Bld: 250 mg/dL — ABNORMAL HIGH (ref 70–99)
Potassium: 4.6 mEq/L (ref 3.5–5.1)
Sodium: 137 mEq/L (ref 135–145)

## 2017-04-05 LAB — CBC WITH DIFFERENTIAL/PLATELET
Basophils Absolute: 0.1 10*3/uL (ref 0.0–0.1)
Basophils Relative: 1.3 % (ref 0.0–3.0)
Eosinophils Absolute: 0.1 10*3/uL (ref 0.0–0.7)
Eosinophils Relative: 2.4 % (ref 0.0–5.0)
HCT: 31 % — ABNORMAL LOW (ref 36.0–46.0)
Hemoglobin: 10.2 g/dL — ABNORMAL LOW (ref 12.0–15.0)
Lymphocytes Relative: 25.4 % (ref 12.0–46.0)
Lymphs Abs: 1.6 10*3/uL (ref 0.7–4.0)
MCHC: 32.7 g/dL (ref 30.0–36.0)
MCV: 76.9 fl — ABNORMAL LOW (ref 78.0–100.0)
Monocytes Absolute: 0.5 10*3/uL (ref 0.1–1.0)
Monocytes Relative: 7.8 % (ref 3.0–12.0)
Neutro Abs: 3.9 10*3/uL (ref 1.4–7.7)
Neutrophils Relative %: 63.1 % (ref 43.0–77.0)
Platelets: 260 10*3/uL (ref 150.0–400.0)
RBC: 4.04 Mil/uL (ref 3.87–5.11)
RDW: 18.4 % — ABNORMAL HIGH (ref 11.5–15.5)
WBC: 6.2 10*3/uL (ref 4.0–10.5)

## 2017-04-05 MED ORDER — SUPREP BOWEL PREP KIT 17.5-3.13-1.6 GM/177ML PO SOLN: ORAL | 0 refills | Status: DC

## 2017-04-05 MED ORDER — RANITIDINE HCL 150 MG PO TABS: 150.0000 mg | ORAL_TABLET | Freq: Two times a day (BID) | ORAL | 3 refills | Status: DC

## 2017-04-05 NOTE — Progress Notes (Signed)
HPI :  66 y/o female with a history of DM, GERD, HTN, HLD, referred for evaluation for anemia by Dr. Reynold Bowen..   She's had a microcytic anemia over a long period of time time - previously baseline 10-11s, Hgb dropped to 7.4 about a month ago.. She reports a chronic anemia over the years, but not to the level it is now. She is getting Aranesp shots, she is taking oral iron. She has not needed a blood transfusion since 2016 when she had a knee surgery. She was given Aransesp for stage III CKD. Cr of 1.8  On 5/23. She thinks her energy is slightly improved but has chronic fatigue over years per patient. She had normal iron studies on 9/18.  She reports she had a colonoscopy when she was in her 92s, she has not had one since that time. She has not had a prior upper endoscopy. She denies any blood in her stools. She has a hemorrhoid which flares and bothers her at time - she notes some discomfort but not blood. Her stomach can bother her at times - epigastric discomfort which she thinks comes and goes. She thinks sometimes certain foods can make her feel worse, sometimes foods cause her to have a bowel movement. She states she is lactose intolerant. She is eating okay, no nausea or vomiting. She takes tylenl PRN for pain, she denies NSAID use recently. She does have some heartburn which can bother her at times. She uses zantac PRN but is not taking it much at all..   She denies any history in her family for colon or stomach cancer.   She reports she has been on omeprazole in the past. She thought it did make her stomach better but she is not sure if it caused edema in her legs.       Past Medical History:  Diagnosis Date  . Anemia    Hx. of  . Anxiety   . Back pain    intractable low back  . 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 Dysrhythmia    palpitations or heart racing periodic. has had for years- Dr Mare Ferrari follows.  [name] GERD  (gastroesophageal reflux disease)   . Hx MRSA infection   . Hyperlipidemia   . Hypertension   . Migraines   . Osteoarthritis   . Palpitations      Past Surgical History:  Procedure Laterality Date  . ABDOMINAL HYSTERECTOMY    . ANTERIOR CERVICAL DECOMP/DISCECTOMY FUSION N/A 01/01/2014   Procedure: CERVICAL THREE-FOUR,CERVICAL FIVE-SIX,CERVICAL SIX-SEVEN ANTERIOR CERVICAL DECOMPRESSION/DISCECTOMY/FUSION;  Surgeon: Kristeen Miss, MD;  Location: [facility];  Service: Neurosurgery;  Laterality: N/A;  left-side approach  . EYE SURGERY Bilateral    cataracts  . HAND SURGERY     left hand  . KNEE SURGERY Left    torn mensicus  . NASAL SINUS SURGERY    . OTHER SURGICAL HISTORY     c section x 1  . OTHER SURGICAL HISTORY     d&c x 2  . TOTAL KNEE ARTHROPLASTY Left 02/12/2015   Procedure: TOTAL KNEE ARTHROPLASTY;  Surgeon: [name], MD;  Location: [facility];  Service: Orthopedics;  Laterality: Left;   Family History  Problem Relation Age of Onset  . Heart disease Mother   . Heart failure Father    Social History  Substance Use Topics  . Smoking status: Never Smoker  . Smokeless tobacco: Never Used  .  Alcohol use No   Current Outpatient Prescriptions  Medication Sig Dispense Refill  . acetaminophen (TYLENOL) 650 MG CR tablet Take 1,300 mg by mouth every 8 (eight) hours as needed for pain (pain).    . Ascorbic Acid (VITAMIN C) 1000 MG tablet Take 1,000 mg by mouth at bedtime.    [name] aspirin 81 MG tablet Take 81 mg by mouth daily.    . Azelastine HCl 0.15 % SOLN Place 1 spray into both nostrils 2 (two) times daily.    . B-D INS SYR HALF-UNIT .3CC/31G 31G X 5/16" 0.3 ML MISC 1 each by Other route See admin instructions. Use as directed 2 times daily with insulin    . Cholecalciferol (VITAMIN D-3) 5000 UNITS TABS Take 5,000 Units by mouth daily.    [name] ezetimibe-simvastatin (VYTORIN) 10-40 MG per tablet Take 1 tablet by mouth daily.    . ferrous gluconate (FERGON) 324 MG tablet Take  324 mg by mouth daily with breakfast.    . fexofenadine (ALLEGRA) 180 MG tablet Take 180 mg by mouth daily.    [name] guaiFENesin (MUCINEX) 600 MG 12 hr tablet Take 600 mg by mouth daily.    [name] HUMULIN R U-500 KWIKPEN 500 UNIT/ML kwikpen Inject 60 units into the skin daily in the morning. Inject 35 units into the skin daily at lunch time. Inject 30 units into the skin daily in the evening.  6  . hydrochlorothiazide (MICROZIDE) 12.5 MG capsule TAKE 2 CAPSULES (25 MG TOTAL) BY MOUTH DAILY. 60 capsule 8  . KLOR-CON 10 10 MEQ tablet TAKE 2 TABLETS (20 MEQ TOTAL) BY MOUTH 2 (TWO) TIMES DAILY. 360 tablet 2  . Multiple Vitamins-Minerals (ALIVE WOMENS ENERGY PO) Take 1 tablet by mouth daily.    [name] NASONEX 50 MCG/ACT nasal spray Place 2 sprays into the nose at bedtime as needed (for allergies).     . ONE TOUCH ULTRA TEST test strip 1 each by Other route See admin instructions. Check blood sugar 3 times daily    . OVER THE COUNTER MEDICATION Take 1 tablet by mouth 2 (two) times daily. Menorelief  tabs    . OVER THE COUNTER MEDICATION Take 1 tablet by mouth 2 (two) times daily. isoflabones 40 mg for hot flashes    . OVER THE COUNTER MEDICATION Take 1 tablet by mouth 2 (two) times daily. [facility] [place] -- Triple Joint Relief    . torsemide (DEMADEX) 20 MG tablet TAKE 1 TABLET (20 MG TOTAL) BY MOUTH DAILY AS NEEDED (FOR FLUID). 30 tablet 11  . TRULICITY 1.5 GD/9.2EQ SOPN Inject 1.5 mg as directed once a week.    . vitamin E 400 UNIT capsule Take 400 Units by mouth daily.    . carvedilol (COREG) 25 MG tablet Take 25 mg by mouth 2 (two) times daily.  3  . ranitidine (ZANTAC) 150 MG tablet Take 1 tablet (150 mg total) by mouth 2 (two) times daily. 90 tablet 3  . SUPREP BOWEL PREP KIT 17.5-3.13-1.6 GM/180ML SOLN Suprep-Use as directed 354 mL 0   No current facility-administered medications for this visit.    Allergies  Allergen Reactions  . Gabapentin Swelling  . Aspirin Other (See Comments)    Can tolerate low  aspirin  . Augmentin [Amoxicillin-Pot Clavulanate] Other (See Comments)    GI issues  . Erythromycin Other (See Comments)    Gi issues  . Nickel Itching and Rash    Breakouts   . Other Other (See Comments)  Omnicef-GI issues     Review of Systems: All systems reviewed and negative except where noted in HPI.   Lab Results  Component Value Date   IRON 60 03/15/2017   TIBC 328 03/15/2017   FERRITIN 300 03/15/2017    CBC Latest Ref Rng & Units 04/05/2017 03/15/2017 02/11/2017  WBC 4.0 - 10.5 K/uL 6.2 - -  Hemoglobin 12.0 - 15.0 g/dL 10.2(L) 8.3(L) 7.4(L)  Hematocrit 36.0 - 46.0 % 31.0(L) - -  Platelets 150.0 - 400.0 K/uL 260.0 - -   Lab Results  Component Value Date   CREATININE 1.97 (H) 04/05/2017   BUN 37 (H) 04/05/2017   NA 137 04/05/2017   K 4.6 04/05/2017   CL 102 04/05/2017   CO2 27 04/05/2017     Physical Exam: BP (!) 148/78   Pulse 72   Ht _0  (1.549 m)   Wt 155 lb (70.3 kg)   BMI 29.29 kg/m  Constitutional: Pleasant,well-developed, female in no acute distress. HEENT: Normocephalic and atraumatic. Conjunctivae are normal. No scleral icterus. Neck supple.  Cardiovascular: Normal rate, regular rhythm.  Pulmonary/chest: Effort normal and breath sounds normal. No wheezing, rales or rhonchi. Abdominal: Soft, nondistended, nontender.  There are no masses palpable. No hepatomegaly. Extremities: no edema Lymphadenopathy: No cervical adenopathy noted. Neurological: Alert and oriented to person place and time. Skin: Skin is warm and dry. No rashes noted. Psychiatric: Normal mood and affect. Behavior is normal.   ASSESSMENT AND PLAN: 66 year old female with history of CKD presenting for evaluation for a worsening anemia.   Anemia - she has a chronic microcytic anemia, unclear of prior iron values but on oral iron her iron stores are normal and her microcytic anemia persists. She denies any overt GI blood loss. It's possible her anemia could be related to  chronic kidney disease. She's had recent doses of Aranesp and her hemoglobin was repeated today and is improving. That being said, she has not had a colonoscopy for screening purposes and is overdue for this. I offered her a colonoscopy in this light, as well as an upper endoscopy given her history of reflux and occasional epigastric discomfort. Following a discussion of the risk and benefits of EGD and colonoscopy she wanted to proceed. In the interim she can continue iron. I do recommend that she increase her Zantac to 150 mg twice daily for now as she reports prior reaction to omeprazole. Pending her workup and course she may also benefit from hematology referral if her anemia progresses.  CKD - Cr appears to be worsening, recommend she hold her diuretic but she should contact her nephrologist to discuss  GERD / epigastric pain - as above, increasing zantac, will await EGD.  [place] Cellar, MD [facility] Pager [phone]  CC: Reynold Bowen, MD

## 2017-04-05 NOTE — Patient Instructions (Signed)
You have been scheduled for an endoscopy and colonoscopy. Please follow the written instructions given to you at your visit today. Please pick up your prep supplies at the pharmacy within the next 1-3 days. If you use inhalers (even only as needed), please bring them with you on the day of your procedure. Your physician has requested that you go to www.startemmi.com and enter the access code given to you at your visit today. This web site gives a general overview about your procedure. However, you should still follow specific instructions given to you by our office regarding your preparation for the procedure.  We have sent the following medications to your pharmacy for you to pick up at your convenience: Zantac  Your physician has requested that you go to the basement for the following lab work before leaving today: CBC, BMET  If you are age 53 or older, your body mass index should be between 23-30. Your Body mass index is 29.29 kg/m. If this is out of the aforementioned range listed, please consider follow up with your Primary Care Provider.  If you are age 18 or younger, your body mass index should be between 19-25. Your Body mass index is 29.29 kg/m. If this is out of the aformentioned range listed, please consider follow up with your Primary Care Provider.   Thank you.

## 2017-04-06 ENCOUNTER — Encounter: Payer: Self-pay | Admitting: Gastroenterology

## 2017-04-12 ENCOUNTER — Encounter (HOSPITAL_COMMUNITY)
Admission: RE | Admit: 2017-04-12 | Discharge: 2017-04-12 | Disposition: A | Discharge status: Home / Self Care | Payer: Medicare Other | Source: Ambulatory Visit | Attending: Nephrology | Admitting: Nephrology

## 2017-04-12 DIAGNOSIS — N183 Chronic kidney disease, stage 3 unspecified: Secondary | ICD-10-CM

## 2017-04-12 DIAGNOSIS — D631 Anemia in chronic kidney disease: Secondary | ICD-10-CM | POA: Diagnosis present

## 2017-04-12 LAB — IRON AND TIBC
Iron: 75 ug/dL (ref 28–170)
Saturation Ratios: 23 % (ref 10.4–31.8)
TIBC: 329 ug/dL (ref 250–450)
UIBC: 254 ug/dL

## 2017-04-12 LAB — POCT HEMOGLOBIN-HEMACUE: Hemoglobin: 8.6 g/dL — ABNORMAL LOW (ref 12.0–15.0)

## 2017-04-12 LAB — FERRITIN: Ferritin: 248 ng/mL (ref 11–307)

## 2017-04-12 MED ORDER — DARBEPOETIN ALFA 100 MCG/0.5ML IJ SOSY: 100.0000 ug | PREFILLED_SYRINGE | INTRAMUSCULAR | Status: DC

## 2017-04-12 MED ORDER — DARBEPOETIN ALFA 100 MCG/0.5ML IJ SOSY
PREFILLED_SYRINGE | INTRAMUSCULAR | Status: AC
  Administered 2017-04-12: 100 ug
  Filled 2017-04-12: qty 0.5

## 2017-04-18 ENCOUNTER — Encounter: Payer: Self-pay | Admitting: Gastroenterology

## 2017-04-18 ENCOUNTER — Ambulatory Visit (AMBULATORY_SURGERY_CENTER): Payer: Medicare Other | Admitting: Gastroenterology

## 2017-04-18 VITALS — BP 144/74 | HR 86 | Temp 97.1°F | Resp 16 | Ht 61.0 in | Wt 155.0 lb

## 2017-04-18 DIAGNOSIS — D123 Benign neoplasm of transverse colon: Secondary | ICD-10-CM | POA: Diagnosis not present

## 2017-04-18 DIAGNOSIS — D649 Anemia, unspecified: Secondary | ICD-10-CM

## 2017-04-18 DIAGNOSIS — D122 Benign neoplasm of ascending colon: Secondary | ICD-10-CM

## 2017-04-18 DIAGNOSIS — K219 Gastro-esophageal reflux disease without esophagitis: Secondary | ICD-10-CM | POA: Diagnosis not present

## 2017-04-18 DIAGNOSIS — D120 Benign neoplasm of cecum: Secondary | ICD-10-CM

## 2017-04-18 DIAGNOSIS — D124 Benign neoplasm of descending colon: Secondary | ICD-10-CM

## 2017-04-18 MED ORDER — SODIUM CHLORIDE 0.9 % IV SOLN: 500.0000 mL | INTRAVENOUS | Status: DC

## 2017-04-18 NOTE — Patient Instructions (Signed)
YOU HAD AN ENDOSCOPIC PROCEDURE TODAY AT [facility]:   Refer to the procedure report that was given to you for any specific questions about what was found during the examination.  If the procedure report does not answer your questions, please call your gastroenterologist to clarify.  If you requested that your care partner not be given the details of your procedure findings, then the procedure report has been included in a sealed envelope for you to review at your convenience later.  YOU SHOULD EXPECT: Some feelings of bloating in the abdomen. Passage of more gas than usual.  Walking can help get rid of the air that was put into your GI tract during the procedure and reduce the bloating. If you had a lower endoscopy (such as a colonoscopy or flexible sigmoidoscopy) you may notice spotting of blood in your stool or on the toilet paper. If you underwent a bowel prep for your procedure, you may not have a normal bowel movement for a few days.  Please Note:  You might notice some irritation and congestion in your nose or some drainage.  This is from the oxygen used during your procedure.  There is no need for concern and it should clear up in a day or so.  SYMPTOMS TO REPORT IMMEDIATELY:   Following lower endoscopy (colonoscopy or flexible sigmoidoscopy):  Excessive amounts of blood in the stool  Significant tenderness or worsening of abdominal pains  Swelling of the abdomen that is new, acute  Fever of 100F or higher   Following upper endoscopy (EGD)  Vomiting of blood or coffee ground material  New chest pain or pain under the shoulder blades  Painful or persistently difficult swallowing  New shortness of breath  Fever of 100F or higher  Black, tarry-looking stools  For urgent or emergent issues, a gastroenterologist can be reached at any hour by calling [phone].   DIET:  We do recommend a small meal at first, but then you may proceed to your regular diet.  Drink  plenty of fluids but you should avoid alcoholic beverages for 24 hours.  ACTIVITY:  You should plan to take it easy for the rest of today and you should NOT DRIVE or use heavy machinery until tomorrow (because of the sedation medicines used during the test).    FOLLOW UP: Our staff will call the number listed on your records the next business day following your procedure to check on you and address any questions or concerns that you may have regarding the information given to you following your procedure. If we do not reach you, we will leave a message.  However, if you are feeling well and you are not experiencing any problems, there is no need to return our call.  We will assume that you have returned to your regular daily activities without incident.  If any biopsies were taken you will be contacted by phone or by letter within the next 1-3 weeks.  Please call us at [phone] if you have not heard about the biopsies in 3 weeks.   Await for biopsy results to determine next repeat Colonoscopy screening Polyps (handout given) Hiatal Hernia (handout given) Hemorrhoids (handout given) .Marland KitchenNo ibuprofen, naproxen or other non-steroidal anti-inflammatory drugs for 2 weeks after polyp removal. Tylenol okay if needed.  SIGNATURES/CONFIDENTIALITY: You and/or your care partner have signed paperwork which will be entered into your electronic medical record.  These signatures attest to the fact that that the information above on  your After Visit Summary has been reviewed and is understood.  Full responsibility of the confidentiality of this discharge information lies with you and/or your care-partner. 

## 2017-04-18 NOTE — Op Note (Signed)
[place] Patient Name: Belinda [name] Procedure Date: 04/18/2017 7:22 AM MRN: [id number] Endoscopist: Remo Lipps P. Elester Apodaca MD, MD Age: 66 Referring MD:  Date of Birth: 1951-05-11 Gender: Female Account #: [id number] Procedure:                Upper GI endoscopy Indications:              Anemia - unspecified Medicines:                Monitored Anesthesia Care Procedure:                Pre-Anesthesia Assessment:                           - Prior to the procedure, a History and Physical                            was performed, and patient medications and                            allergies were reviewed. The patient's tolerance of                            previous anesthesia was also reviewed. The risks                            and benefits of the procedure and the sedation                            options and risks were discussed with the patient.                            All questions were answered, and informed consent                            was obtained. Prior Anticoagulants: The patient has                            taken no previous anticoagulant or antiplatelet                            agents. ASA Grade Assessment: III - A patient with                            severe systemic disease. After reviewing the risks                            and benefits, the patient was deemed in                            satisfactory condition to undergo the procedure.                           After obtaining informed consent, the endoscope was  passed under direct vision. Throughout the                            procedure, the patient's blood pressure, pulse, and                            oxygen saturations were monitored continuously. The                            Endoscope was introduced through the mouth, and                            advanced to the second part of duodenum. The upper                            GI endoscopy was  accomplished without difficulty.                            The patient tolerated the procedure well. Scope In: Scope Out: Findings:                 Esophagogastric landmarks were identified: the                            Z-line was found at 38 cm, the gastroesophageal                            junction was found at 38 cm and the upper extent of                            the gastric folds was found at 40 cm from the                            incisors.                           A 2 cm hiatal hernia was present.                           The exam of the esophagus was otherwise normal.                           The entire examined stomach was normal. Biopsies                            were taken from antrum, body, incisura, with a cold                            forceps for Helicobacter pylori testing given                            anemia.                           The duodenal bulb  and second portion of the                            duodenum were normal. Complications:            No immediate complications. Estimated blood loss:                            Minimal. Estimated Blood Loss:     Estimated blood loss was minimal. Impression:               - Esophagogastric landmarks identified.                           - 2 cm hiatal hernia.                           - Normal esophagus otherwise                           - Normal stomach. Biopsied to rule out H pylori                            given anemia.                           - Normal duodenal bulb and second portion of the                            duodenum. Recommendation:           - Patient has a contact number available for                            emergencies. The signs and symptoms of potential                            delayed complications were discussed with the                            patient. Return to normal activities tomorrow.                            Written discharge instructions were provided to the                             patient.                           - Resume previous diet.                           - Continue present medications.                           - Await pathology results. Remo Lipps P. Zayne Draheim MD, MD 04/18/2017 8:33:25 AM This report has been signed electronically.

## 2017-04-18 NOTE — Progress Notes (Signed)
Called to room to assist during endoscopic procedure.  Patient ID and intended procedure confirmed with present staff. Received instructions for my participation in the procedure from the performing physician.  

## 2017-04-18 NOTE — Progress Notes (Signed)
Report given to PACU, vss 

## 2017-04-18 NOTE — Op Note (Signed)
[place] Patient Name: Belinda [name] Procedure Date: 04/18/2017 7:21 AM MRN: [id number] Endoscopist: Remo Lipps P. Crystal Scarberry MD, MD Age: 66 Referring MD:  Date of Birth: 09-30-1950 Gender: Female Account #: [id number] Procedure:                Colonoscopy Indications:              Anemia Medicines:                Monitored Anesthesia Care Procedure:                Pre-Anesthesia Assessment:                           - Prior to the procedure, a History and Physical                            was performed, and patient medications and                            allergies were reviewed. The patient's tolerance of                            previous anesthesia was also reviewed. The risks                            and benefits of the procedure and the sedation                            options and risks were discussed with the patient.                            All questions were answered, and informed consent                            was obtained. Prior Anticoagulants: The patient has                            taken no previous anticoagulant or antiplatelet                            agents. ASA Grade Assessment: III - A patient with                            severe systemic disease. After reviewing the risks                            and benefits, the patient was deemed in                            satisfactory condition to undergo the procedure.                           After obtaining informed consent, the colonoscope  was passed under direct vision. Throughout the                            procedure, the patient's blood pressure, pulse, and                            oxygen saturations were monitored continuously. The                            Model PCF-H190DL [id number]) scope was introduced                            through the anus and advanced to the the terminal                            ileum, with identification of the  appendiceal                            orifice and IC valve. The colonoscopy was                            technically difficult and complex due to                            significant looping. The patient tolerated the                            procedure well. The quality of the bowel                            preparation was good. The terminal ileum, ileocecal                            valve, appendiceal orifice, and rectum were                            photographed. Scope In: 7:49:58 AM Scope Out: 8:25:16 AM Scope Withdrawal Time: 0 hours 26 minutes 56 seconds  Total Procedure Duration: 0 hours 35 minutes 18 seconds  Findings:                 The perianal and digital rectal examinations were                            normal.                           The terminal ileum appeared normal.                           A diminutive polyp was found in the cecum. The                            polyp was sessile. The polyp was removed with a  cold biopsy forceps. Resection and retrieval were                            complete.                           A 4 mm polyp was found in the cecum. The polyp was                            sessile. The polyp was removed with a cold snare.                            Resection and retrieval were complete.                           Two sessile polyps were found in the ascending                            colon. The polyps were 3 to 5 mm in size. These                            polyps were removed with a cold snare. Resection                            and retrieval were complete.                           A 3 mm polyp was found in the transverse colon. The                            polyp was sessile. The polyp was removed with a                            cold biopsy forceps. Resection and retrieval were                            complete.                           A 8 mm polyp was found in the descending colon. The                             polyp was pedunculated. The polyp was removed with                            a hot snare. Resection and retrieval were complete.                           Internal hemorrhoids were found during retroflexion.                           The colon was extremely tortous with significant  looping. The exam was otherwise without abnormality. Complications:            No immediate complications. Estimated blood loss:                            Minimal. Estimated Blood Loss:     Estimated blood loss was minimal. Impression:               - The examined portion of the ileum was normal.                           - One diminutive polyp in the cecum, removed with a                            cold biopsy forceps. Resected and retrieved.                           - One 4 mm polyp in the cecum, removed with a cold                            snare. Resected and retrieved.                           - Two 3 to 5 mm polyps in the ascending colon,                            removed with a cold snare. Resected and retrieved.                           - One 3 mm polyp in the transverse colon, removed                            with a cold biopsy forceps. Resected and retrieved.                           - One 8 mm polyp in the descending colon, removed                            with a hot snare. Resected and retrieved.                           - Internal hemorrhoids.                           - The colon was extremely tortous with looping.                           - The examination was otherwise normal.                           Overall, no cause for anemia noted on colonoscopy,                            perhaps this is due  to kidney disease. Recommendation:           - Patient has a contact number available for                            emergencies. The signs and symptoms of potential                            delayed complications were discussed with the                             patient. Return to normal activities tomorrow.                            Written discharge instructions were provided to the                            patient.                           - Resume previous diet.                           - Continue present medications.                           - Await pathology results.                           - Repeat colonoscopy is recommended for                            surveillance. The colonoscopy date will be                            determined after pathology results from today's                            exam become available for review.                           - No ibuprofen, naproxen, or other non-steroidal                            anti-inflammatory drugs Valena Ivanov P. Jeanpaul Biehl MD, MD 04/18/2017 8:38:44 AM This report has been signed electronically.

## 2017-04-19 ENCOUNTER — Telehealth: Payer: Self-pay

## 2017-04-19 NOTE — Telephone Encounter (Signed)
  Follow up Call-  Call back number 04/18/2017  Post procedure Call Back phone  # [phone]  Permission to leave phone message Yes  Some recent data might be hidden     Patient questions:  Do you have a fever, pain , or abdominal swelling? No. Pain Score  0 *  Have you tolerated food without any problems? Yes.    Have you been able to return to your normal activities? Yes.    Do you have any questions about your discharge instructions: Diet   No. Medications  No. Follow up visit  No.  Do you have questions or concerns about your Care? No.  Actions: * If pain score is 4 or above: No action needed, pain <4.  No complaints noted per pt. maw

## 2017-04-26 ENCOUNTER — Ambulatory Visit (INDEPENDENT_AMBULATORY_CARE_PROVIDER_SITE_OTHER): Payer: Medicare Other | Admitting: Internal Medicine

## 2017-04-26 VITALS — BP 153/95 | HR 89 | Ht 61.0 in | Wt 158.0 lb

## 2017-04-26 DIAGNOSIS — R002 Palpitations: Secondary | ICD-10-CM | POA: Diagnosis not present

## 2017-04-26 DIAGNOSIS — I509 Heart failure, unspecified: Secondary | ICD-10-CM

## 2017-04-26 DIAGNOSIS — N189 Chronic kidney disease, unspecified: Secondary | ICD-10-CM

## 2017-04-26 DIAGNOSIS — D649 Anemia, unspecified: Secondary | ICD-10-CM | POA: Diagnosis not present

## 2017-04-26 NOTE — Patient Instructions (Addendum)
Your physician recommends that you return for lab work in:  CBC, BNP, CMET  Your physician has requested that you have an echocardiogram @ [address] - [address]rd Floor. Echocardiography is a painless test that uses sound waves to create images of your heart. It provides your doctor with information about the size and shape of your heart and how well your heart's chambers and valves are working. This procedure takes approximately one hour. There are no restrictions for this procedure.  Your physician recommends that you schedule a follow-up appointment in 4-6 weeks with Dr. Debara Pickett.

## 2017-04-26 NOTE — Progress Notes (Signed)
OFFICE NOTE  Chief Complaint:  Shortness of breath  Primary Care Physician: Reynold Bowen, MD  HPI:  Belinda [name] is a 66 y.o. female with a past medial history significant for palpitations and prior PVCs.  She was formerly followed by Dr. Mare Ferrari and Dr. Acie Fredrickson.  Recently in September she presented to [facility] urgent care with shortness of breath and was thought to have a pneumonia.  She was treated with antibiotics however her chest x-ray indicated mild to moderate cardiomegaly and mild congestive heart failure pattern with a superimposed bibasilar infiltrate.  She was instructed to follow-up with cardiology as there was concern for possible new congestive heart failure.  She has a long-standing history of hypertension as well as insulin-dependent diabetes followed by Dr. Forde Dandy.  In 2016 she underwent left knee replacement and had a nuclear stress test prior to that which showed normal perfusion.  She denies any new chest pain.  She does report getting short of breath with exertion.  She also recently has had some wheezing.  Her primary care provider put her on some Symbicort which seems to have helped somewhat with her symptoms as it was felt to be allergic.  She also reports some lower extremity edema.  Additionally, she has stage III chronic kidney disease and is followed by Dr. Hollie Salk with [facility].  PMHx:  Past Medical History:  Diagnosis Date  . Allergy   . Anemia    Hx. of  . Anxiety   . Asthma   . Back pain    intractable low back  . Blood transfusion without reported diagnosis 2016   After knee replacemnet  . Cataract 2012   bilateral eyes  . Chronic kidney disease    Stage 3  . 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 Dysrhythmia    palpitations or heart racing periodic. has had for years- Dr Mare Ferrari follows.  [name] GERD (gastroesophageal reflux disease)   . Hx MRSA infection   . Hyperlipidemia   . Hypertension    . Migraines   . Osteoarthritis   . Palpitations     Past Surgical History:  Procedure Laterality Date  . ABDOMINAL HYSTERECTOMY    . ANTERIOR CERVICAL DECOMP/DISCECTOMY FUSION N/A 01/01/2014   Procedure: CERVICAL THREE-FOUR,CERVICAL FIVE-SIX,CERVICAL SIX-SEVEN ANTERIOR CERVICAL DECOMPRESSION/DISCECTOMY/FUSION;  Surgeon: Kristeen Miss, MD;  Location: [facility];  Service: Neurosurgery;  Laterality: N/A;  left-side approach  . EYE SURGERY Bilateral    cataracts  . HAND SURGERY     left hand  . KNEE SURGERY Left    torn mensicus  . NASAL SINUS SURGERY    . OTHER SURGICAL HISTORY     c section x 1  . OTHER SURGICAL HISTORY     d&c x 2  . TOTAL KNEE ARTHROPLASTY Left 02/12/2015   Procedure: TOTAL KNEE ARTHROPLASTY;  Surgeon: [name], MD;  Location: [facility];  Service: Orthopedics;  Laterality: Left;    FAMHx:  Family History  Problem Relation Age of Onset  . Heart disease Mother   . Heart failure Father   . Colon cancer Neg Hx   . Stomach cancer Neg Hx   . Esophageal cancer Neg Hx     SOCHx:   reports that she has never smoked. She has never used smokeless tobacco. She reports that she does not drink alcohol or use drugs.  ALLERGIES:  Allergies  Allergen Reactions  . Aspirin Other (See Comments)  Can tolerate low aspirin  . Augmentin [Amoxicillin-Pot Clavulanate] Nausea And Vomiting and Other (See Comments)    GI issues  . Erythromycin Nausea And Vomiting and Other (See Comments)    Gi issues  . Gabapentin Swelling  . Nickel Itching and Rash    Breakouts   . Other Nausea And Vomiting and Other (See Comments)    Omnicef-GI issues    ROS: Pertinent items noted in HPI and remainder of comprehensive ROS otherwise negative.  HOME MEDS: Current Outpatient Prescriptions on File Prior to Visit  Medication Sig Dispense Refill  . acetaminophen (TYLENOL) 650 MG CR tablet Take 1,300 mg by mouth every 8 (eight) hours as needed for pain (pain).    . Ascorbic Acid  (VITAMIN C) 1000 MG tablet Take 1,000 mg by mouth at bedtime.    [name] aspirin 81 MG tablet Take 81 mg by mouth daily.    . Azelastine HCl 0.15 % SOLN Place 1 spray into both nostrils 2 (two) times daily.    . B-D INS SYR HALF-UNIT .3CC/31G 31G X 5/16" 0.3 ML MISC 1 each by Other route See admin instructions. Use as directed 2 times daily with insulin    . carvedilol (COREG) 25 MG tablet Take 25 mg by mouth 2 (two) times daily.  3  . Cholecalciferol (VITAMIN D-3) 5000 UNITS TABS Take 5,000 Units by mouth daily.    [name] ezetimibe-simvastatin (VYTORIN) 10-40 MG per tablet Take 1 tablet by mouth daily.    . ferrous gluconate (FERGON) 324 MG tablet Take 324 mg by mouth daily with breakfast.    . fexofenadine (ALLEGRA) 180 MG tablet Take 180 mg by mouth daily.    [name] guaiFENesin (MUCINEX) 600 MG 12 hr tablet Take 600 mg by mouth daily.    [name] HUMULIN R U-500 KWIKPEN 500 UNIT/ML kwikpen Inject 60 units into the skin daily in the morning. Inject 35 units into the skin daily at lunch time. Inject 30 units into the skin daily in the evening.  6  . hydrochlorothiazide (MICROZIDE) 12.5 MG capsule TAKE 2 CAPSULES (25 MG TOTAL) BY MOUTH DAILY. 60 capsule 8  . KLOR-CON 10 10 MEQ tablet TAKE 2 TABLETS (20 MEQ TOTAL) BY MOUTH 2 (TWO) TIMES DAILY. 360 tablet 2  . Multiple Vitamins-Minerals (ALIVE WOMENS ENERGY PO) Take 1 tablet by mouth daily.    . ONE TOUCH ULTRA TEST test strip 1 each by Other route See admin instructions. Check blood sugar 3 times daily    . OVER THE COUNTER MEDICATION Take 1 tablet by mouth 2 (two) times daily. isoflabones 40 mg for hot flashes    . OVER THE COUNTER MEDICATION Take 1 tablet by mouth 2 (two) times daily. [place] -- Triple Joint Relief    . ranitidine (ZANTAC) 150 MG tablet Take 1 tablet (150 mg total) by mouth 2 (two) times daily. 90 tablet 3  . torsemide (DEMADEX) 20 MG tablet TAKE 1 TABLET (20 MG TOTAL) BY MOUTH DAILY AS NEEDED (FOR FLUID). 30 tablet 11  . TRULICITY 1.5  JO/8.4ZY SOPN Inject 1.5 mg as directed once a week.    . vitamin E 400 UNIT capsule Take 400 Units by mouth daily.     No current facility-administered medications on file prior to visit.     LABS/IMAGING: No results found for this or any previous visit (from the past 48 hour(s)). No results found.  LIPID PANEL: No results found for: CHOL, TRIG, HDL, CHOLHDL, VLDL, LDLCALC, LDLDIRECT  WEIGHTS: Wt Readings from Last 3 Encounters:  04/26/17 158 lb (71.7 kg)  04/18/17 155 lb (70.3 kg)  04/05/17 155 lb (70.3 kg)    VITALS: BP (!) 153/95 (BP Location: Left Arm, Patient Position: Sitting, Cuff Size: Normal)   Pulse 89   Ht 5\' 1"  (1.549 m)   Wt 158 lb (71.7 kg)   BMI 29.85 kg/m   EXAM: General appearance: alert and no distress Neck: no carotid bruit, no JVD and thyroid not enlarged, symmetric, no tenderness/mass/nodules Lungs: clear to auscultation bilaterally Heart: regular rate and rhythm, S1, S2 normal and systolic murmur: early systolic 2/6, blowing at apex Abdomen: soft, non-tender; bowel sounds normal; no masses,  no organomegaly Extremities: edema Trace to 1+ bilateral pedal Pulses: 2+ and symmetric Skin: Skin color, texture, turgor normal. No rashes or lesions Neurologic: Grossly normal Psych: Pleasant  EKG: Sinus rhythm with PVCs, left axis deviation, lateral T wave changes at 89- personally reviewed  ASSESSMENT: 1. Acute congestive heart failure 2. CKD 3 3. Insulin-dependent diabetes 4. Dyslipidemia 5. Anemia 6. Palpitations/PVCs  PLAN: 1.   Belinda [name] has recently had shortness of breath and was diagnosed with pneumonia/congestive heart failure based on chest x-ray findings.  Her primary care provider instructed her on taking some torsemide more regularly for several days after this episode for which she lost about 10 pounds of fluid primarily and noted improvement in her swelling.  It does sound like she had congestive heart failure.  Her chest x-ray shows  cardiomegaly and showed some pulmonary vascular congestion.  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  Will order an echocardiogram and recheck a CBC, CMET and BNP.  We may need to put her on standing Lasix.  She is currently on HCTZ daily.  Follow-up with me afterwards.  Pixie Casino, MD, [place]  Attending Cardiologist  Direct Dial: [phone]  Fax: [phone]  Website:  [email or web address]  Nadean Corwin Hilty 04/26/2017, 1:05 PM

## 2017-04-27 LAB — COMPREHENSIVE METABOLIC PANEL
ALT: 16 IU/L (ref 0–32)
AST: 25 IU/L (ref 0–40)
Albumin/Globulin Ratio: 2.1 (ref 1.2–2.2)
Albumin: 4.2 g/dL (ref 3.6–4.8)
Alkaline Phosphatase: 79 IU/L (ref 39–117)
BUN/Creatinine Ratio: 14 (ref 12–28)
BUN: 32 mg/dL — ABNORMAL HIGH (ref 8–27)
Bilirubin Total: 0.8 mg/dL (ref 0.0–1.2)
CO2: 26 mmol/L (ref 20–29)
Calcium: 9.3 mg/dL (ref 8.7–10.3)
Chloride: 100 mmol/L (ref 96–106)
Creatinine, Ser: 2.31 mg/dL — ABNORMAL HIGH (ref 0.57–1.00)
GFR calc Af Amer: 25 mL/min/{1.73_m2} — ABNORMAL LOW (ref >59)
GFR calc non Af Amer: 22 mL/min/{1.73_m2} — ABNORMAL LOW (ref >59)
Globulin, Total: 2 g/dL (ref 1.5–4.5)
Glucose: 249 mg/dL — ABNORMAL HIGH (ref 65–99)
Potassium: 4.8 mmol/L (ref 3.5–5.2)
Sodium: 142 mmol/L (ref 134–144)
Total Protein: 6.2 g/dL (ref 6.0–8.5)

## 2017-04-27 LAB — PRO B NATRIURETIC PEPTIDE: NT-Pro BNP: 4620 pg/mL — ABNORMAL HIGH (ref 0–301)

## 2017-04-27 LAB — CBC
Hematocrit: 30 % — ABNORMAL LOW (ref 34.0–46.6)
Hemoglobin: 9 g/dL — ABNORMAL LOW (ref 11.1–15.9)
MCH: 23.9 pg — ABNORMAL LOW (ref 26.6–33.0)
MCHC: 30 g/dL — ABNORMAL LOW (ref 31.5–35.7)
MCV: 80 fL (ref 79–97)
Platelets: 337 10*3/uL (ref 150–379)
RBC: 3.77 x10E6/uL (ref 3.77–5.28)
RDW: 20.1 % — ABNORMAL HIGH (ref 12.3–15.4)
WBC: 7.5 10*3/uL (ref 3.4–10.8)

## 2017-04-28 ENCOUNTER — Telehealth: Payer: Self-pay | Admitting: Internal Medicine

## 2017-04-28 ENCOUNTER — Other Ambulatory Visit: Payer: Self-pay | Admitting: Nurse Practitioner

## 2017-04-28 DIAGNOSIS — Z79899 Other long term (current) drug therapy: Secondary | ICD-10-CM

## 2017-04-28 MED ORDER — TORSEMIDE 20 MG PO TABS: 20.0000 mg | ORAL_TABLET | Freq: Two times a day (BID) | ORAL | 5 refills | Status: DC

## 2017-04-28 NOTE — Telephone Encounter (Signed)
Patient aware of MD recommendations

## 2017-04-28 NOTE — Telephone Encounter (Signed)
Patient called w/results. She voiced understanding of med changes. She will have repeat BMET next week. Rx(s) sent to pharmacy electronically + lab ordered.    She states she is taking LOSARTAN-HCTZ  100-25MG  daily + HCTZ 25MG  daily. Advised to d/c HCTZ and would clarify with MD on combination med.

## 2017-04-28 NOTE — Telephone Encounter (Signed)
Notes recorded by Pixie Casino, MD on 04/27/2017 at 3:37 PM EDT HGB stable. Creatinine is elevated and BNP is very high. D/c HCTZ - have her take torsemide 20 mg BID and repeat BMET next Monday. If creatinine worsens, may need to be admitted.

## 2017-04-28 NOTE — Telephone Encounter (Signed)
Ok to stay on combo diuretic, but stop additional HCTZ - start torsemide.  Dr. Lemmie Evens

## 2017-04-29 ENCOUNTER — Other Ambulatory Visit: Payer: Self-pay

## 2017-04-29 ENCOUNTER — Ambulatory Visit (HOSPITAL_COMMUNITY): Payer: Medicare Other | Attending: Cardiology

## 2017-04-29 DIAGNOSIS — E119 Type 2 diabetes mellitus without complications: Secondary | ICD-10-CM | POA: Diagnosis not present

## 2017-04-29 DIAGNOSIS — I493 Ventricular premature depolarization: Secondary | ICD-10-CM | POA: Diagnosis not present

## 2017-04-29 DIAGNOSIS — I509 Heart failure, unspecified: Secondary | ICD-10-CM | POA: Diagnosis not present

## 2017-04-29 DIAGNOSIS — I110 Hypertensive heart disease with heart failure: Secondary | ICD-10-CM | POA: Diagnosis not present

## 2017-04-29 DIAGNOSIS — I081 Rheumatic disorders of both mitral and tricuspid valves: Secondary | ICD-10-CM | POA: Insufficient documentation

## 2017-04-29 DIAGNOSIS — E785 Hyperlipidemia, unspecified: Secondary | ICD-10-CM | POA: Diagnosis not present

## 2017-04-29 DIAGNOSIS — I313 Pericardial effusion (noninflammatory): Secondary | ICD-10-CM | POA: Insufficient documentation

## 2017-05-05 LAB — BASIC METABOLIC PANEL
BUN/Creatinine Ratio: 20 (ref 12–28)
BUN: 50 mg/dL — ABNORMAL HIGH (ref 8–27)
CO2: 28 mmol/L (ref 20–29)
Calcium: 9.3 mg/dL (ref 8.7–10.3)
Chloride: 98 mmol/L (ref 96–106)
Creatinine, Ser: 2.44 mg/dL — ABNORMAL HIGH (ref 0.57–1.00)
GFR calc Af Amer: 23 mL/min/{1.73_m2} — ABNORMAL LOW (ref >59)
GFR calc non Af Amer: 20 mL/min/{1.73_m2} — ABNORMAL LOW (ref >59)
Glucose: 162 mg/dL — ABNORMAL HIGH (ref 65–99)
Potassium: 4.6 mmol/L (ref 3.5–5.2)
Sodium: 138 mmol/L (ref 134–144)

## 2017-05-06 ENCOUNTER — Telehealth: Payer: Self-pay | Admitting: Internal Medicine

## 2017-05-06 DIAGNOSIS — Z79899 Other long term (current) drug therapy: Secondary | ICD-10-CM

## 2017-05-06 NOTE — Telephone Encounter (Signed)
Patient called w/echo & lab results. Voiced understanding of results. Explained cardiomyopathy & HF and how medications can be used to manage this and symptoms. She is aware of need to decrease torsemide to 20mg  daily and will have repeat lab work next week.

## 2017-05-10 ENCOUNTER — Encounter (HOSPITAL_COMMUNITY)
Admission: RE | Admit: 2017-05-10 | Discharge: 2017-05-10 | Disposition: A | Discharge status: Home / Self Care | Payer: Medicare Other | Source: Ambulatory Visit | Attending: Nephrology | Admitting: Nephrology

## 2017-05-10 VITALS — BP 169/96 | HR 93 | Temp 97.5°F | Resp 18

## 2017-05-10 DIAGNOSIS — N183 Chronic kidney disease, stage 3 unspecified: Secondary | ICD-10-CM

## 2017-05-10 DIAGNOSIS — D631 Anemia in chronic kidney disease: Secondary | ICD-10-CM | POA: Insufficient documentation

## 2017-05-10 LAB — IRON AND TIBC
Iron: 79 ug/dL (ref 28–170)
Saturation Ratios: 22 % (ref 10.4–31.8)
TIBC: 354 ug/dL (ref 250–450)
UIBC: 275 ug/dL

## 2017-05-10 LAB — POCT HEMOGLOBIN-HEMACUE: Hemoglobin: 9 g/dL — ABNORMAL LOW (ref 12.0–15.0)

## 2017-05-10 LAB — FERRITIN: Ferritin: 280 ng/mL (ref 11–307)

## 2017-05-10 MED ORDER — DARBEPOETIN ALFA 100 MCG/0.5ML IJ SOSY
100.0000 ug | PREFILLED_SYRINGE | INTRAMUSCULAR | Status: DC
  Administered 2017-05-10: 100 ug via SUBCUTANEOUS

## 2017-05-10 MED ORDER — DARBEPOETIN ALFA 100 MCG/0.5ML IJ SOSY
PREFILLED_SYRINGE | INTRAMUSCULAR | Status: AC
  Filled 2017-05-10: qty 0.5

## 2017-05-17 LAB — BASIC METABOLIC PANEL
BUN/Creatinine Ratio: 17 (ref 12–28)
BUN: 36 mg/dL — ABNORMAL HIGH (ref 8–27)
CO2: 27 mmol/L (ref 20–29)
Calcium: 9.8 mg/dL (ref 8.7–10.3)
Chloride: 99 mmol/L (ref 96–106)
Creatinine, Ser: 2.12 mg/dL — ABNORMAL HIGH (ref 0.57–1.00)
GFR calc Af Amer: 28 mL/min/{1.73_m2} — ABNORMAL LOW (ref >59)
GFR calc non Af Amer: 24 mL/min/{1.73_m2} — ABNORMAL LOW (ref >59)
Glucose: 141 mg/dL — ABNORMAL HIGH (ref 65–99)
Potassium: 4.6 mmol/L (ref 3.5–5.2)
Sodium: 141 mmol/L (ref 134–144)

## 2017-05-22 ENCOUNTER — Other Ambulatory Visit: Payer: Self-pay | Admitting: Cardiovascular Disease

## 2017-05-27 ENCOUNTER — Ambulatory Visit: Payer: Medicare Other | Admitting: Internal Medicine

## 2017-05-27 ENCOUNTER — Encounter: Payer: Self-pay | Admitting: Internal Medicine

## 2017-05-27 VITALS — BP 156/92 | HR 96 | Ht 61.0 in | Wt 158.0 lb

## 2017-05-27 DIAGNOSIS — Z79899 Other long term (current) drug therapy: Secondary | ICD-10-CM

## 2017-05-27 DIAGNOSIS — I5021 Acute systolic (congestive) heart failure: Secondary | ICD-10-CM

## 2017-05-27 DIAGNOSIS — I10 Essential (primary) hypertension: Secondary | ICD-10-CM | POA: Diagnosis not present

## 2017-05-27 DIAGNOSIS — N189 Chronic kidney disease, unspecified: Secondary | ICD-10-CM

## 2017-05-27 MED ORDER — SACUBITRIL-VALSARTAN 49-51 MG PO TABS: 1.0000 | ORAL_TABLET | Freq: Two times a day (BID) | ORAL | 5 refills | Status: DC

## 2017-05-27 NOTE — Patient Instructions (Addendum)
Your physician has recommended you make the following change in your medication:  -- STOP losartan-hctz -- START entresto 49-51mg  twice daily  - 1 box of samples + free 30 day card provided  www.[email or web address] -- resources under "living with HF" tab  Your physician recommends that you return for lab work in [place] after starting entresto (BMET)  Your physician recommends that you schedule a follow-up appointment in: [place] with Dr. Debara Pickett

## 2017-05-30 ENCOUNTER — Encounter: Payer: Self-pay | Admitting: Internal Medicine

## 2017-05-30 DIAGNOSIS — I10 Essential (primary) hypertension: Secondary | ICD-10-CM | POA: Insufficient documentation

## 2017-05-30 DIAGNOSIS — I5021 Acute systolic (congestive) heart failure: Secondary | ICD-10-CM | POA: Insufficient documentation

## 2017-05-30 NOTE — Progress Notes (Signed)
OFFICE NOTE  Chief Complaint:  Breathing has improved  Primary Care Physician: Reynold Bowen, MD  HPI:  Belinda [name] is a 66 y.o. female with a past medial history significant for palpitations and prior PVCs.  She was formerly followed by Dr. Mare Ferrari and Dr. Acie Fredrickson.  Recently in September she presented to [facility] urgent care with shortness of breath and was thought to have a pneumonia.  She was treated with antibiotics however her chest x-ray indicated mild to moderate cardiomegaly and mild congestive heart failure pattern with a superimposed bibasilar infiltrate.  She was instructed to follow-up with cardiology as there was concern for possible new congestive heart failure.  She has a long-standing history of hypertension as well as insulin-dependent diabetes followed by Dr. Forde Dandy.  In 2016 she underwent left knee replacement and had a nuclear stress test prior to that which showed normal perfusion.  She denies any new chest pain.  She does report getting short of breath with exertion.  She also recently has had some wheezing.  Her primary care provider put her on some Symbicort which seems to have helped somewhat with her symptoms as it was felt to be allergic.  She also reports some lower extremity edema.  Additionally, she has stage III chronic kidney disease and is followed by Dr. Hollie Salk with [facility].  05/27/2017  Belinda [name] returns today for follow-up.  She reports her breathing has improved.  We made some adjustments to her medications, including discontinuing hydrochlorothiazide.  She is now on torsemide 20 mg daily.  Echo shows her LVEF at 25-30% as expected. Initially her creatinine was rising somewhat, however, she has diuresed.  Blood pressure remains elevated.  We discussed options for additional heart failure management, including switching her losartan over to [facility].  She may need additional medication for blood pressure as well.  PMHx:  Past Medical  History:  Diagnosis Date  . Allergy   . Anemia    Hx. of  . Anxiety   . Asthma   . Back pain    intractable low back  . Blood transfusion without reported diagnosis 2016   After knee replacemnet  . Cataract 2012   bilateral eyes  . Chronic kidney disease    Stage 3  . 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 Dysrhythmia    palpitations or heart racing periodic. has had for years- Dr Mare Ferrari follows.  [name] GERD (gastroesophageal reflux disease)   . Hx MRSA infection   . Hyperlipidemia   . Hypertension   . Migraines   . Osteoarthritis   . Palpitations     Past Surgical History:  Procedure Laterality Date  . ABDOMINAL HYSTERECTOMY    . ANTERIOR CERVICAL DECOMP/DISCECTOMY FUSION N/A 01/01/2014   Procedure: CERVICAL THREE-FOUR,CERVICAL FIVE-SIX,CERVICAL SIX-SEVEN ANTERIOR CERVICAL DECOMPRESSION/DISCECTOMY/FUSION;  Surgeon: Kristeen Miss, MD;  Location: [place] [facility];  Service: Neurosurgery;  Laterality: N/A;  left-side approach  . EYE SURGERY Bilateral    cataracts  . HAND SURGERY     left hand  . KNEE SURGERY Left    torn mensicus  . NASAL SINUS SURGERY    . OTHER SURGICAL HISTORY     c section x 1  . OTHER SURGICAL HISTORY     d&c x 2  . TOTAL KNEE ARTHROPLASTY Left 02/12/2015   Procedure: TOTAL KNEE ARTHROPLASTY;  Surgeon: [name], MD;  Location: [facility];  Service: Orthopedics;  Laterality: Left;  FAMHx:  Family History  Problem Relation Age of Onset  . Heart disease Mother   . Heart failure Father   . Colon cancer Neg Hx   . Stomach cancer Neg Hx   . Esophageal cancer Neg Hx     SOCHx:   reports that  has never smoked. she has never used smokeless tobacco. She reports that she does not drink alcohol or use drugs.  ALLERGIES:  Allergies  Allergen Reactions  . Aspirin Other (See Comments)    Can tolerate low aspirin  . Augmentin [Amoxicillin-Pot Clavulanate] Nausea And Vomiting and Other (See Comments)    GI issues   . Erythromycin Nausea And Vomiting and Other (See Comments)    Gi issues  . Gabapentin Swelling  . Nickel Itching and Rash    Breakouts   . Other Nausea And Vomiting and Other (See Comments)    Omnicef-GI issues    ROS: Pertinent items noted in HPI and remainder of comprehensive ROS otherwise negative.  HOME MEDS: Current Outpatient Medications on File Prior to Visit  Medication Sig Dispense Refill  . acetaminophen (TYLENOL) 650 MG CR tablet Take 1,300 mg by mouth every 8 (eight) hours as needed for pain (pain).    . Ascorbic Acid (VITAMIN C) 1000 MG tablet Take 1,000 mg by mouth at bedtime.    [name] aspirin 81 MG tablet Take 81 mg by mouth daily.    . Azelastine HCl 0.15 % SOLN Place 1 spray into both nostrils 2 (two) times daily.    . B-D INS SYR HALF-UNIT .3CC/31G 31G X 5/16" 0.3 ML MISC 1 each by Other route See admin instructions. Use as directed 2 times daily with insulin    . carvedilol (COREG) 25 MG tablet Take 25 mg by mouth 2 (two) times daily.  3  . Cholecalciferol (VITAMIN D-3) 5000 UNITS TABS Take 5,000 Units by mouth daily.    [name] ezetimibe-simvastatin (VYTORIN) 10-40 MG per tablet Take 1 tablet by mouth daily.    . ferrous gluconate (FERGON) 324 MG tablet Take 324 mg by mouth daily with breakfast.    . fexofenadine (ALLEGRA) 180 MG tablet Take 180 mg by mouth daily.    [name] guaiFENesin (MUCINEX) 600 MG 12 hr tablet Take 600 mg by mouth daily.    [name] HUMULIN R U-500 KWIKPEN 500 UNIT/ML kwikpen Inject 5U into skin three times daily with meals  6  . hydrochlorothiazide (MICROZIDE) 12.5 MG capsule TAKE 2 CAPSULES (25 MG TOTAL) BY MOUTH DAILY. 60 capsule 11  . Multiple Vitamins-Minerals (ALIVE WOMENS ENERGY PO) Take 1 tablet by mouth daily.    . ONE TOUCH ULTRA TEST test strip 1 each by Other route See admin instructions. Check blood sugar 3 times daily    . OVER THE COUNTER MEDICATION Take 1 tablet by mouth 2 (two) times daily. isoflabones 40 mg for hot flashes    . OVER THE COUNTER  MEDICATION Take 1 tablet by mouth 2 (two) times daily. [place] -- Triple Joint Relief    . [START ON 06/10/2017] potassium chloride (KLOR-CON 10) 10 MEQ tablet Take 2 tablets (20 mEq total) by mouth 2 (two) times daily. 360 tablet 3  . ranitidine (ZANTAC) 150 MG tablet Take 1 tablet (150 mg total) by mouth 2 (two) times daily. 90 tablet 3  . torsemide (DEMADEX) 20 MG tablet Take 20 mg daily by mouth.    . TRULICITY 1.5 GQ/6.7YP SOPN Inject 1.5 mg as directed once a week.    [name]  vitamin E 400 UNIT capsule Take 400 Units by mouth daily.     No current facility-administered medications on file prior to visit.     LABS/IMAGING: No results found for this or any previous visit (from the past 48 hour(s)). No results found.  LIPID PANEL: No results found for: CHOL, TRIG, HDL, CHOLHDL, VLDL, LDLCALC, LDLDIRECT   WEIGHTS: Wt Readings from Last 3 Encounters:  05/27/17 158 lb (71.7 kg)  04/26/17 158 lb (71.7 kg)  04/18/17 155 lb (70.3 kg)    VITALS: BP (!) 156/92   Pulse 96   Ht 5\' 1"  (1.549 m)   Wt 158 lb (71.7 kg)   BMI 29.85 kg/m   EXAM: Deferred  EKG: Deferred  ASSESSMENT: 1. Acute systolic congestive heart failure, NYHA class II symptoms-LVEF 25-30% 2. CKD 3 3. Insulin-dependent diabetes 4. Dyslipidemia 5. Anemia 6. Palpitations/PVCs  PLAN: 1.   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  She has stage III chronic kidney disease.  She has benefited from the addition of a loop diuretic.  I think she would additionally benefit from substituting her losartan HCTZ with Entresto.  We will start the 49/51 mg dose.  Repeat a metabolic profile in a week and work towards titrating the dose up as tolerated.  Follow-up in 3 months.  Pixie Casino, MD, [facility], [place] Director of the Advanced Lipid Disorders &  Cardiovascular Risk Reduction Clinic Attending Cardiologist  Direct Dial:  [phone]  Fax: [phone]  Website:  [email or web address]  Nadean Corwin Hilty 05/30/2017, 1:20 PM

## 2017-06-07 ENCOUNTER — Inpatient Hospital Stay (HOSPITAL_COMMUNITY)
Admission: RE | Admit: 2017-06-07 | Discharge: 2017-06-07 | Disposition: A | Discharge status: Home / Self Care | Payer: Medicare Other | Source: Ambulatory Visit | Attending: Nephrology | Admitting: Nephrology

## 2017-06-09 ENCOUNTER — Encounter (HOSPITAL_COMMUNITY)
Admission: RE | Admit: 2017-06-09 | Discharge: 2017-06-09 | Disposition: A | Discharge status: Home / Self Care | Payer: Medicare Other | Source: Ambulatory Visit | Attending: Nephrology | Admitting: Nephrology

## 2017-06-09 VITALS — BP 158/85 | HR 93 | Temp 97.4°F | Resp 18

## 2017-06-09 DIAGNOSIS — D631 Anemia in chronic kidney disease: Secondary | ICD-10-CM | POA: Insufficient documentation

## 2017-06-09 DIAGNOSIS — N183 Chronic kidney disease, stage 3 unspecified: Secondary | ICD-10-CM

## 2017-06-09 LAB — FERRITIN: Ferritin: 361 ng/mL — ABNORMAL HIGH (ref 11–307)

## 2017-06-09 LAB — POCT HEMOGLOBIN-HEMACUE: Hemoglobin: 9.2 g/dL — ABNORMAL LOW (ref 12.0–15.0)

## 2017-06-09 LAB — IRON AND TIBC
Iron: 94 ug/dL (ref 28–170)
Saturation Ratios: 26 % (ref 10.4–31.8)
TIBC: 357 ug/dL (ref 250–450)
UIBC: 263 ug/dL

## 2017-06-09 MED ORDER — DARBEPOETIN ALFA 100 MCG/0.5ML IJ SOSY
PREFILLED_SYRINGE | INTRAMUSCULAR | Status: AC
  Filled 2017-06-09: qty 0.5

## 2017-06-09 MED ORDER — DARBEPOETIN ALFA 100 MCG/0.5ML IJ SOSY
100.0000 ug | PREFILLED_SYRINGE | INTRAMUSCULAR | Status: DC
  Administered 2017-06-09: 100 ug via SUBCUTANEOUS

## 2017-06-10 LAB — BASIC METABOLIC PANEL
BUN/Creatinine Ratio: 20 (ref 12–28)
BUN: 39 mg/dL — ABNORMAL HIGH (ref 8–27)
CO2: 21 mmol/L (ref 20–29)
Calcium: 9.5 mg/dL (ref 8.7–10.3)
Chloride: 101 mmol/L (ref 96–106)
Creatinine, Ser: 1.99 mg/dL — ABNORMAL HIGH (ref 0.57–1.00)
GFR calc Af Amer: 30 mL/min/{1.73_m2} — ABNORMAL LOW (ref >59)
GFR calc non Af Amer: 26 mL/min/{1.73_m2} — ABNORMAL LOW (ref >59)
Glucose: 262 mg/dL — ABNORMAL HIGH (ref 65–99)
Potassium: 4.8 mmol/L (ref 3.5–5.2)
Sodium: 139 mmol/L (ref 134–144)

## 2017-07-07 ENCOUNTER — Encounter (HOSPITAL_COMMUNITY): Payer: Medicare Other

## 2017-07-15 ENCOUNTER — Encounter (HOSPITAL_COMMUNITY)
Admission: RE | Admit: 2017-07-15 | Discharge: 2017-07-15 | Disposition: A | Discharge status: Home / Self Care | Payer: Medicare Other | Source: Ambulatory Visit | Attending: Nephrology | Admitting: Nephrology

## 2017-07-15 VITALS — BP 149/88 | HR 92 | Temp 97.5°F | Resp 18

## 2017-07-15 DIAGNOSIS — D631 Anemia in chronic kidney disease: Secondary | ICD-10-CM

## 2017-07-15 DIAGNOSIS — N183 Chronic kidney disease, stage 3 unspecified: Secondary | ICD-10-CM

## 2017-07-15 LAB — IRON AND TIBC
Iron: 69 ug/dL (ref 28–170)
Saturation Ratios: 27 % (ref 10.4–31.8)
TIBC: 259 ug/dL (ref 250–450)
UIBC: 190 ug/dL

## 2017-07-15 LAB — FERRITIN: Ferritin: 363 ng/mL — ABNORMAL HIGH (ref 11–307)

## 2017-07-15 LAB — POCT HEMOGLOBIN-HEMACUE: Hemoglobin: 8 g/dL — ABNORMAL LOW (ref 12.0–15.0)

## 2017-07-15 MED ORDER — DARBEPOETIN ALFA 100 MCG/0.5ML IJ SOSY
PREFILLED_SYRINGE | INTRAMUSCULAR | Status: AC
  Administered 2017-07-15: 100 ug via SUBCUTANEOUS
  Filled 2017-07-15: qty 0.5

## 2017-07-15 MED ORDER — DARBEPOETIN ALFA 100 MCG/0.5ML IJ SOSY
100.0000 ug | PREFILLED_SYRINGE | INTRAMUSCULAR | Status: DC
  Administered 2017-07-15: 100 ug via SUBCUTANEOUS

## 2017-08-12 ENCOUNTER — Encounter (HOSPITAL_COMMUNITY)
Admission: RE | Admit: 2017-08-12 | Discharge: 2017-08-12 | Disposition: A | Discharge status: Home / Self Care | Payer: Medicare Other | Source: Ambulatory Visit | Attending: Nephrology | Admitting: Nephrology

## 2017-08-12 VITALS — BP 168/94 | HR 85 | Temp 97.5°F | Resp 18

## 2017-08-12 DIAGNOSIS — N183 Chronic kidney disease, stage 3 unspecified: Secondary | ICD-10-CM

## 2017-08-12 DIAGNOSIS — D631 Anemia in chronic kidney disease: Secondary | ICD-10-CM | POA: Diagnosis present

## 2017-08-12 LAB — POCT HEMOGLOBIN-HEMACUE: Hemoglobin: 8.2 g/dL — ABNORMAL LOW (ref 12.0–15.0)

## 2017-08-12 MED ORDER — DARBEPOETIN ALFA 100 MCG/0.5ML IJ SOSY
PREFILLED_SYRINGE | INTRAMUSCULAR | Status: AC
  Administered 2017-08-12: 100 ug
  Filled 2017-08-12: qty 0.5

## 2017-08-12 MED ORDER — DARBEPOETIN ALFA 200 MCG/0.4ML IJ SOSY: 200.0000 ug | PREFILLED_SYRINGE | INTRAMUSCULAR | Status: DC

## 2017-08-30 ENCOUNTER — Ambulatory Visit: Payer: Medicare Other | Admitting: Internal Medicine

## 2017-08-30 ENCOUNTER — Encounter: Payer: Self-pay | Admitting: Internal Medicine

## 2017-08-30 VITALS — BP 160/86 | HR 80 | Ht 61.0 in | Wt 167.6 lb

## 2017-08-30 DIAGNOSIS — I10 Essential (primary) hypertension: Secondary | ICD-10-CM | POA: Diagnosis not present

## 2017-08-30 DIAGNOSIS — N189 Chronic kidney disease, unspecified: Secondary | ICD-10-CM

## 2017-08-30 DIAGNOSIS — I5021 Acute systolic (congestive) heart failure: Secondary | ICD-10-CM

## 2017-08-30 DIAGNOSIS — I5022 Chronic systolic (congestive) heart failure: Secondary | ICD-10-CM

## 2017-08-30 MED ORDER — SACUBITRIL-VALSARTAN 97-103 MG PO TABS: 1.0000 | ORAL_TABLET | Freq: Two times a day (BID) | ORAL | 5 refills | Status: DC

## 2017-08-30 NOTE — Progress Notes (Signed)
OFFICE NOTE  Chief Complaint:  Recent heart failure  Primary Care Physician: Reynold Bowen, MD  HPI:  Belinda [name] is a 67 y.o. female with a past medial history significant for palpitations and prior PVCs.  She was formerly followed by Dr. Mare Ferrari and Dr. Acie Fredrickson.  Recently in September she presented to [facility] urgent care with shortness of breath and was thought to have a pneumonia.  She was treated with antibiotics however her chest x-ray indicated mild to moderate cardiomegaly and mild congestive heart failure pattern with a superimposed bibasilar infiltrate.  She was instructed to follow-up with cardiology as there was concern for possible new congestive heart failure.  She has a long-standing history of hypertension as well as insulin-dependent diabetes followed by Dr. Forde Dandy.  In 2016 she underwent left knee replacement and had a nuclear stress test prior to that which showed normal perfusion.  She denies any new chest pain.  She does report getting short of breath with exertion.  She also recently has had some wheezing.  Her primary care provider put her on some Symbicort which seems to have helped somewhat with her symptoms as it was felt to be allergic.  She also reports some lower extremity edema.  Additionally, she has stage III chronic kidney disease and is followed by Dr. Hollie Salk with [facility].  05/27/2017  Mrs. Vanderbeck returns today for follow-up.  She reports her breathing has improved.  We made some adjustments to her medications, including discontinuing hydrochlorothiazide.  She is now on torsemide 20 mg daily.  Echo shows her LVEF at 25-30% as expected. Initially her creatinine was rising somewhat, however, she has diuresed.  Blood pressure remains elevated.  We discussed options for additional heart failure management, including switching her losartan over to [facility].  She may need additional medication for blood pressure as well.  08/30/2017  Mrs. Bazinet  was seen today in follow-up.  She recently saw Dr. Hollie Salk at [facility].  She was noted to have significant weight gain and acute systolic congestive heart failure.  Her weight had gone up to 279 pounds.  She been struggling with influenza and pneumonia and received steroids and antibiotics.  This likely played a role in her weight gain.  She was advised to increase her torsemide to 20 mg twice daily.  Since then she has had significant diuresis.  Her weight today is down to 167 pounds.  She reports improvement in her breathing.  She is tolerated Entresto with a moderate dose and we discussed possibly increasing that today.  She reports very infrequent episodes of hypotension with systolic blood pressure around 100 but in general her systolic blood pressures are around 135 to 145.  PMHx:  Past Medical History:  Diagnosis Date  . Allergy   . Anemia    Hx. of  . Anxiety   . Asthma   . Back pain    intractable low back  . Blood transfusion without reported diagnosis 2016   After knee replacemnet  . Cataract 2012   bilateral eyes  . Chronic kidney disease    Stage 3  . 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 Dysrhythmia    palpitations or heart racing periodic. has had for years- Dr Mare Ferrari follows.  [name] GERD (gastroesophageal reflux disease)   . Hx MRSA infection   . Hyperlipidemia   . Hypertension   . Migraines   . Osteoarthritis   . Palpitations  Past Surgical History:  Procedure Laterality Date  . ABDOMINAL HYSTERECTOMY    . ANTERIOR CERVICAL DECOMP/DISCECTOMY FUSION N/A 01/01/2014   Procedure: CERVICAL THREE-FOUR,CERVICAL FIVE-SIX,CERVICAL SIX-SEVEN ANTERIOR CERVICAL DECOMPRESSION/DISCECTOMY/FUSION;  Surgeon: Kristeen Miss, MD;  Location: [place] [facility];  Service: Neurosurgery;  Laterality: N/A;  left-side approach  . EYE SURGERY Bilateral    cataracts  . HAND SURGERY     left hand  . KNEE SURGERY Left    torn mensicus  . NASAL SINUS  SURGERY    . OTHER SURGICAL HISTORY     c section x 1  . OTHER SURGICAL HISTORY     d&c x 2  . TOTAL KNEE ARTHROPLASTY Left 02/12/2015   Procedure: TOTAL KNEE ARTHROPLASTY;  Surgeon: [name], MD;  Location: [facility];  Service: Orthopedics;  Laterality: Left;    FAMHx:  Family History  Problem Relation Age of Onset  . Heart disease Mother   . Heart failure Father   . Colon cancer Neg Hx   . Stomach cancer Neg Hx   . Esophageal cancer Neg Hx     SOCHx:   reports that  has never smoked. she has never used smokeless tobacco. She reports that she does not drink alcohol or use drugs.  ALLERGIES:  Allergies  Allergen Reactions  . Aspirin Other (See Comments)    Can tolerate low aspirin  . Augmentin [Amoxicillin-Pot Clavulanate] Nausea And Vomiting and Other (See Comments)    GI issues  . Erythromycin Nausea And Vomiting and Other (See Comments)    Gi issues  . Gabapentin Swelling  . Nickel Itching and Rash    Breakouts   . Other Nausea And Vomiting and Other (See Comments)    Omnicef-GI issues    ROS: Pertinent items noted in HPI and remainder of comprehensive ROS otherwise negative.  HOME MEDS: Current Outpatient Medications on File Prior to Visit  Medication Sig Dispense Refill  . acetaminophen (TYLENOL) 650 MG CR tablet Take 1,300 mg by mouth every 8 (eight) hours as needed for pain (pain).    . Ascorbic Acid (VITAMIN C) 1000 MG tablet Take 1,000 mg by mouth at bedtime.    [name] aspirin 81 MG tablet Take 81 mg by mouth daily.    . Azelastine HCl 0.15 % SOLN Place 1 spray into both nostrils 2 (two) times daily.    . B-D INS SYR HALF-UNIT .3CC/31G 31G X 5/16" 0.3 ML MISC 1 each by Other route See admin instructions. Use as directed 2 times daily with insulin    . carvedilol (COREG) 25 MG tablet Take 25 mg by mouth 2 (two) times daily.  3  . Cholecalciferol (VITAMIN D-3) 5000 UNITS TABS Take 5,000 Units by mouth daily.    [name] ezetimibe-simvastatin (VYTORIN) 10-40 MG per  tablet Take 1 tablet by mouth daily.    . ferrous gluconate (FERGON) 324 MG tablet Take 324 mg by mouth daily with breakfast.    . fexofenadine (ALLEGRA) 180 MG tablet Take 180 mg by mouth daily.    [name] guaiFENesin (MUCINEX) 600 MG 12 hr tablet Take 600 mg by mouth daily.    [name] HUMULIN R U-500 KWIKPEN 500 UNIT/ML kwikpen Inject 5U into skin three times daily with meals  6  . Multiple Vitamins-Minerals (ALIVE WOMENS ENERGY PO) Take 1 tablet by mouth daily.    . ONE TOUCH ULTRA TEST test strip 1 each by Other route See admin instructions. Check blood sugar 3 times daily    . OVER  THE COUNTER MEDICATION Take 1 tablet by mouth 2 (two) times daily. isoflabones 40 mg for hot flashes    . OVER THE COUNTER MEDICATION Take 1 tablet by mouth 2 (two) times daily. [place] -- Triple Joint Relief    . potassium chloride (KLOR-CON 10) 10 MEQ tablet Take 2 tablets (20 mEq total) by mouth 2 (two) times daily. 360 tablet 3  . ranitidine (ZANTAC) 150 MG tablet Take 1 tablet (150 mg total) by mouth 2 (two) times daily. 90 tablet 3  . sacubitril-valsartan (ENTRESTO) 49-51 MG Take 1 tablet by mouth 2 (two) times daily. 60 tablet 5  . torsemide (DEMADEX) 20 MG tablet Take 20 mg by mouth 2 (two) times daily.     . TRULICITY 1.5 AQ/7.6AU SOPN Inject 1.5 mg as directed once a week.    . vitamin E 400 UNIT capsule Take 400 Units by mouth daily.     No current facility-administered medications on file prior to visit.     LABS/IMAGING: No results found for this or any previous visit (from the past 48 hour(s)). No results found.  LIPID PANEL: No results found for: CHOL, TRIG, HDL, CHOLHDL, VLDL, LDLCALC, LDLDIRECT   WEIGHTS: Wt Readings from Last 3 Encounters:  08/30/17 167 lb 9.6 oz (76 kg)  05/27/17 158 lb (71.7 kg)  04/26/17 158 lb (71.7 kg)    VITALS: BP (!) 160/86   Pulse 80   Ht 5\' 1"  (1.549 m)   Wt 167 lb 9.6 oz (76 kg)   BMI 31.67 kg/m   EXAM: General appearance: alert and no  distress Neck: JVD - 2 cm above sternal notch, no carotid bruit and thyroid not enlarged, symmetric, no tenderness/mass/nodules Lungs: diminished breath sounds bilaterally Heart: regular rate and rhythm Abdomen: soft, non-tender; bowel sounds normal; no masses,  no organomegaly Extremities: edema 1+ edema Pulses: 2+ and symmetric Skin: Skin color, texture, turgor normal. No rashes or lesions Neurologic: Grossly normal Psych: Pleasant  EKG: Normal sinus rhythm at 80, left axis deviation-personally reviewed  ASSESSMENT: 1. Acute systolic congestive heart failure, NYHA class II symptoms-LVEF 25-30% 2. CKD 3 3. Insulin-dependent diabetes 4. Dyslipidemia 5. Anemia 6. Palpitations/PVCs  PLAN: 1.   Mrs. Repetto had recent significant weight gain and acute on chronic systolic congestive heart failure, likely complicated by flu, pneumonia and steroids.  She seems to be recovering but still has intermittent shortness of breath.  She is on Symbicort and albuterol.  She is diuresed about 12 pounds since seeing her nephrologist.  She is on torsemide twice daily.  She is on the moderate dose of Entresto would benefit from an increase to the full dose.  I recommend starting that as soon as possible and we will repeat a metabolic profile in about a week.  Then I will plan to see her back in 4-6 weeks.  In addition will provide paperwork for cost assistance.  Pixie Casino, MD, [facility], [place] Director of the Advanced Lipid Disorders &  Cardiovascular Risk Reduction Clinic Attending Cardiologist  Direct Dial: [phone]  Fax: [phone]  Website:  [email or web address] 08/30/2017, 2:51 PM

## 2017-08-30 NOTE — Patient Instructions (Addendum)
Your physician has recommended you make the following change in your medication -- INCREASE entresto to 97-103mg  twice daily -- patient assistance paperwork provided   Your physician recommends that you return for lab work in [place] physician recommends that you schedule a follow-up appointment with Dr. Debara Pickett - first available

## 2017-09-09 ENCOUNTER — Encounter (HOSPITAL_COMMUNITY)
Admission: RE | Admit: 2017-09-09 | Discharge: 2017-09-09 | Disposition: A | Discharge status: Home / Self Care | Payer: Medicare Other | Source: Ambulatory Visit | Attending: Nephrology | Admitting: Nephrology

## 2017-09-09 VITALS — BP 149/93 | HR 84 | Temp 97.9°F | Resp 18

## 2017-09-09 DIAGNOSIS — D631 Anemia in chronic kidney disease: Secondary | ICD-10-CM | POA: Diagnosis present

## 2017-09-09 DIAGNOSIS — N183 Chronic kidney disease, stage 3 unspecified: Secondary | ICD-10-CM

## 2017-09-09 LAB — IRON AND TIBC
Iron: 69 ug/dL (ref 28–170)
Saturation Ratios: 18 % (ref 10.4–31.8)
TIBC: 391 ug/dL (ref 250–450)
UIBC: 322 ug/dL

## 2017-09-09 LAB — POCT HEMOGLOBIN-HEMACUE: Hemoglobin: 8.8 g/dL — ABNORMAL LOW (ref 12.0–15.0)

## 2017-09-09 LAB — FERRITIN: Ferritin: 264 ng/mL (ref 11–307)

## 2017-09-09 MED ORDER — DARBEPOETIN ALFA 200 MCG/0.4ML IJ SOSY
200.0000 ug | PREFILLED_SYRINGE | INTRAMUSCULAR | Status: DC
  Administered 2017-09-09: 200 ug via SUBCUTANEOUS

## 2017-09-09 MED ORDER — DARBEPOETIN ALFA 200 MCG/0.4ML IJ SOSY
PREFILLED_SYRINGE | INTRAMUSCULAR | Status: AC
  Filled 2017-09-09: qty 0.4

## 2017-09-10 LAB — BASIC METABOLIC PANEL
BUN/Creatinine Ratio: 19 (ref 12–28)
BUN: 44 mg/dL — ABNORMAL HIGH (ref 8–27)
CO2: 24 mmol/L (ref 20–29)
Calcium: 9.4 mg/dL (ref 8.7–10.3)
Chloride: 98 mmol/L (ref 96–106)
Creatinine, Ser: 2.32 mg/dL — ABNORMAL HIGH (ref 0.57–1.00)
GFR calc Af Amer: 25 mL/min/{1.73_m2} — ABNORMAL LOW (ref >59)
GFR calc non Af Amer: 21 mL/min/{1.73_m2} — ABNORMAL LOW (ref >59)
Glucose: 213 mg/dL — ABNORMAL HIGH (ref 65–99)
Potassium: 4.2 mmol/L (ref 3.5–5.2)
Sodium: 142 mmol/L (ref 134–144)

## 2017-09-23 ENCOUNTER — Encounter (HOSPITAL_COMMUNITY): Payer: Medicare Other

## 2017-09-27 ENCOUNTER — Other Ambulatory Visit: Payer: Self-pay | Admitting: Internal Medicine

## 2017-10-07 ENCOUNTER — Ambulatory Visit (HOSPITAL_COMMUNITY)
Admission: RE | Admit: 2017-10-07 | Discharge: 2017-10-07 | Disposition: A | Discharge status: Home / Self Care | Payer: Medicare Other | Source: Ambulatory Visit | Attending: Nephrology | Admitting: Nephrology

## 2017-10-07 VITALS — BP 146/86 | HR 79 | Temp 97.5°F | Resp 16

## 2017-10-07 DIAGNOSIS — N183 Chronic kidney disease, stage 3 unspecified: Secondary | ICD-10-CM

## 2017-10-07 DIAGNOSIS — D631 Anemia in chronic kidney disease: Secondary | ICD-10-CM | POA: Diagnosis present

## 2017-10-07 LAB — POCT HEMOGLOBIN-HEMACUE: Hemoglobin: 10.2 g/dL — ABNORMAL LOW (ref 12.0–15.0)

## 2017-10-07 LAB — FERRITIN: Ferritin: 207 ng/mL (ref 11–307)

## 2017-10-07 LAB — IRON AND TIBC
Iron: 57 ug/dL (ref 28–170)
Saturation Ratios: 17 % (ref 10.4–31.8)
TIBC: 335 ug/dL (ref 250–450)
UIBC: 278 ug/dL

## 2017-10-07 MED ORDER — DARBEPOETIN ALFA 200 MCG/0.4ML IJ SOSY
200.0000 ug | PREFILLED_SYRINGE | INTRAMUSCULAR | Status: DC
  Administered 2017-10-07: 200 ug via SUBCUTANEOUS

## 2017-10-07 MED ORDER — DARBEPOETIN ALFA 200 MCG/0.4ML IJ SOSY
PREFILLED_SYRINGE | INTRAMUSCULAR | Status: AC
  Filled 2017-10-07: qty 0.4

## 2017-10-21 ENCOUNTER — Encounter (HOSPITAL_COMMUNITY): Payer: Medicare Other

## 2017-10-26 ENCOUNTER — Ambulatory Visit: Payer: Medicare Other | Admitting: Internal Medicine

## 2017-11-03 ENCOUNTER — Other Ambulatory Visit (HOSPITAL_COMMUNITY): Payer: Self-pay | Admitting: *Deleted

## 2017-11-03 DIAGNOSIS — N183 Chronic kidney disease, stage 3 unspecified: Secondary | ICD-10-CM

## 2017-11-03 DIAGNOSIS — D631 Anemia in chronic kidney disease: Secondary | ICD-10-CM

## 2017-11-04 ENCOUNTER — Ambulatory Visit (HOSPITAL_COMMUNITY)
Admission: RE | Admit: 2017-11-04 | Discharge: 2017-11-04 | Disposition: A | Discharge status: Home / Self Care | Payer: Medicare Other | Source: Ambulatory Visit | Attending: Nephrology | Admitting: Nephrology

## 2017-11-04 VITALS — BP 141/80 | HR 83 | Temp 98.4°F | Resp 18

## 2017-11-04 DIAGNOSIS — D631 Anemia in chronic kidney disease: Secondary | ICD-10-CM

## 2017-11-04 DIAGNOSIS — N183 Chronic kidney disease, stage 3 unspecified: Secondary | ICD-10-CM

## 2017-11-04 LAB — FERRITIN: Ferritin: 294 ng/mL (ref 11–307)

## 2017-11-04 LAB — POCT HEMOGLOBIN-HEMACUE: Hemoglobin: 10.4 g/dL — ABNORMAL LOW (ref 12.0–15.0)

## 2017-11-04 LAB — IRON AND TIBC
Iron: 55 ug/dL (ref 28–170)
Saturation Ratios: 16 % (ref 10.4–31.8)
TIBC: 340 ug/dL (ref 250–450)
UIBC: 285 ug/dL

## 2017-11-04 MED ORDER — DARBEPOETIN ALFA 200 MCG/0.4ML IJ SOSY
200.0000 ug | PREFILLED_SYRINGE | INTRAMUSCULAR | Status: DC
  Administered 2017-11-04: 200 ug via SUBCUTANEOUS

## 2017-11-04 MED ORDER — DARBEPOETIN ALFA 200 MCG/0.4ML IJ SOSY
PREFILLED_SYRINGE | INTRAMUSCULAR | Status: AC
  Administered 2017-11-04: 200 ug via SUBCUTANEOUS
  Filled 2017-11-04: qty 0.4

## 2017-11-18 ENCOUNTER — Encounter (HOSPITAL_COMMUNITY): Payer: Medicare Other

## 2017-11-25 ENCOUNTER — Ambulatory Visit: Payer: Medicare Other | Admitting: Internal Medicine

## 2017-11-25 ENCOUNTER — Encounter: Payer: Self-pay | Admitting: Internal Medicine

## 2017-11-25 VITALS — BP 138/90 | HR 72 | Ht 60.0 in | Wt 166.2 lb

## 2017-11-25 DIAGNOSIS — R600 Localized edema: Secondary | ICD-10-CM

## 2017-11-25 DIAGNOSIS — N189 Chronic kidney disease, unspecified: Secondary | ICD-10-CM | POA: Diagnosis not present

## 2017-11-25 DIAGNOSIS — I509 Heart failure, unspecified: Secondary | ICD-10-CM | POA: Diagnosis not present

## 2017-11-25 MED ORDER — SACUBITRIL-VALSARTAN 49-51 MG PO TABS: 1.0000 | ORAL_TABLET | Freq: Two times a day (BID) | ORAL | 5 refills | Status: DC

## 2017-11-25 NOTE — Patient Instructions (Addendum)
Your physician has recommended you make the following change in your medication:  -- DECREASE entresto to 49-51mg  twice daily  Your physician wants you to follow-up in: 6 months with Dr. Debara Pickett after echo. You will receive a reminder letter in the mail two months in advance. If you don't receive a letter, please call our office to schedule the follow-up appointment.  Dr. Debara Pickett recommends KNEE HIGH COMPRESSION STOCKINGS  -- 20-30 mmHg (compression strength) -- [facility]  -- [address]  -- [place]. [place]   -- [place]  -- [address] [place]  -- [phone]   How to Use Compression Stockings Compression stockings are elastic socks that squeeze the legs. They help to increase blood flow to the legs, decrease swelling in the legs, and reduce the chance of developing blood clots in the lower legs. Compression stockings are often used by people who:  Are recovering from surgery.  Have poor circulation in their legs.  Are prone to getting blood clots in their legs.  Have varicose veins.  Sit or stay in bed for long periods of time. How to use compression stockings Before you put on your compression stockings:  Make sure that they are the correct size. If you do not know your size, ask your health care provider.  Make sure that they are clean, dry, and in good condition.  Check them for rips and tears. Do not put them on if they are ripped or torn. Put your stockings on first thing in the morning, before you get out of bed. Keep them on for as long as your health care provider advises. When you are wearing your stockings:  Keep them as smooth as possible. Do not allow them to bunch up. It is especially important to prevent the stockings from bunching up around your toes or behind your knees.  Do not roll the stockings downward and leave  them rolled down. This can decrease blood flow to your leg.  Change them right away if they become wet or dirty. When you take off your stockings, inspect your legs and feet. Anything that does not seem normal may require medical attention. Look for:  Open sores.  Red spots.  Swelling. Information and tips  Do not stop wearing your compression stockings without talking to your health care provider first.  Wash your stockings every day with mild detergent in cold or warm water. Do not use bleach. Air-dry your stockings or dry them in a clothes dryer on low heat.  Replace your stockings every 3-6 months.  If skin moisturizing is part of your treatment plan, apply lotion or cream at night so that your skin will be dry when you put on the stockings in the morning. It is harder to put the stockings on when you have lotion on your legs or feet. Contact a health care provider if: Remove your stockings and seek medical care if:  You have a feeling of pins and needles in your feet or legs.  You have any new changes in your skin.  You have skin lesions that are getting worse.  You have swelling or pain that is getting worse. Get help right away if:  You have numbness or tingling in your lower legs that does not get better right after you take the stockings off.  Your toes or feet become cold and blue.  You develop  open sores or red spots on your legs that do not go away.  You see or feel a warm spot on your leg.  You have new swelling or soreness in your leg.  You are short of breath or you have chest pain for no reason.  You have a rapid or irregular heartbeat.  You feel light-headed or dizzy. This information is not intended to replace advice given to you by your health care provider. Make sure you discuss any questions you have with your health care provider. Document Released: 04/11/2009 Document Revised: 11/12/2015 Document Reviewed: 05/22/2014 Elsevier Interactive Patient  Education  2017 [employer].

## 2017-11-25 NOTE — Progress Notes (Signed)
OFFICE NOTE  Chief Complaint:  Follow-up heart failure  Primary Care Physician: Belinda Bowen, MD  HPI:  Belinda [name] is a 68 y.o. female with a past medial history significant for palpitations and prior PVCs.  She was formerly followed by Dr. Mare [name] and Dr. Acie [name].  Recently in September she presented to [facility] urgent care with shortness of breath and was thought to have a pneumonia.  She was treated with antibiotics however her chest x-ray indicated mild to moderate cardiomegaly and mild congestive heart failure pattern with a superimposed bibasilar infiltrate.  She was instructed to follow-up with cardiology as there was concern for possible new congestive heart failure.  She has a long-standing history of hypertension as well as insulin-dependent diabetes followed by Dr. Forde [name].  In 2016 she underwent left knee replacement and had a nuclear stress test prior to that which showed normal perfusion.  She denies any new chest pain.  She does report getting short of breath with exertion.  She also recently has had some wheezing.  Her primary care provider put her on some Symbicort which seems to have helped somewhat with her symptoms as it was felt to be allergic.  She also reports some lower extremity edema.  Additionally, she has stage III chronic kidney disease and is followed by Dr. Hollie [name] with [facility].  05/27/2017  Belinda [name] returns today for follow-up.  She reports her breathing has improved.  We made some adjustments to her medications, including discontinuing hydrochlorothiazide.  She is now on torsemide 20 mg daily.  Echo shows her LVEF at 25-30% as expected. Initially her creatinine was rising somewhat, however, she has diuresed.  Blood pressure remains elevated.  We discussed options for additional heart failure management, including switching her losartan over to [facility].  She may need additional medication for blood pressure as well.  08/30/2017  Mrs.  [name] was seen today in follow-up.  She recently saw Dr. Hollie [name] at [facility].  She was noted to have significant weight gain and acute systolic congestive heart failure.  Her weight had gone up to 279 pounds.  She been struggling with influenza and pneumonia and received steroids and antibiotics.  This likely played a role in her weight gain.  She was advised to increase her torsemide to 20 mg twice daily.  Since then she has had significant diuresis.  Her weight today is down to 167 pounds.  She reports improvement in her breathing.  She is tolerated Entresto with a moderate dose and we discussed possibly increasing that today.  She reports very infrequent episodes of hypotension with systolic blood pressure around 100 but in general her systolic blood pressures are around 135 to 145.  11/25/2017  Belinda [name] returns today for follow-up of heart failure.  She reports that she is not felt as well on the high-dose Entresto.  In fact she started taking the medication once daily.  I reminded her that there is no evidence of benefit on once daily dosing, that I am aware of.  Typically if she is having side effects on the high dose of medication we would recommend decreasing the dose.  She also noted her blood pressure had been elevated although it is only top normal today at 138/90.  She denies any worsening shortness of breath.  Weight is remained stable around 166 pounds.  She does have occasional lower extremity swelling which improves with elevation of her feet.  She is interested in compression stockings.  PMHx:  Past Medical History:  Diagnosis Date  . Allergy   . Anemia    Hx. of  . Anxiety   . Asthma   . Back pain    intractable low back  . Blood transfusion without reported diagnosis 2016   After knee replacemnet  . Cataract 2012   bilateral eyes  . Chronic kidney disease    Stage 3  . 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 Dysrhythmia     palpitations or heart racing periodic. has had for years- Dr Belinda [name] follows.  [name] GERD (gastroesophageal reflux disease)   . Hx MRSA infection   . Hyperlipidemia   . Hypertension   . Migraines   . Osteoarthritis   . Palpitations     Past Surgical History:  Procedure Laterality Date  . ABDOMINAL HYSTERECTOMY    . ANTERIOR CERVICAL DECOMP/DISCECTOMY FUSION N/A 01/01/2014   Procedure: CERVICAL THREE-FOUR,CERVICAL FIVE-SIX,CERVICAL SIX-SEVEN ANTERIOR CERVICAL DECOMPRESSION/DISCECTOMY/FUSION;  Surgeon: Kristeen Miss, MD;  Location: [facility];  Service: Neurosurgery;  Laterality: N/A;  left-side approach  . EYE SURGERY Bilateral    cataracts  . HAND SURGERY     left hand  . KNEE SURGERY Left    torn mensicus  . NASAL SINUS SURGERY    . OTHER SURGICAL HISTORY     c section x 1  . OTHER SURGICAL HISTORY     d&c x 2  . TOTAL KNEE ARTHROPLASTY Left 02/12/2015   Procedure: TOTAL KNEE ARTHROPLASTY;  Surgeon: [name], MD;  Location: [facility];  Service: Orthopedics;  Laterality: Left;    FAMHx:  Family History  Problem Relation Age of Onset  . Heart disease Mother   . Heart failure Father   . Colon cancer Neg Hx   . Stomach cancer Neg Hx   . Esophageal cancer Neg Hx     SOCHx:   reports that she has never smoked. She has never used smokeless tobacco. She reports that she does not drink alcohol or use drugs.  ALLERGIES:  Allergies  Allergen Reactions  . Aspirin Other (See Comments)    Can tolerate low aspirin  . Augmentin [Amoxicillin-Pot Clavulanate] Nausea And Vomiting and Other (See Comments)    GI issues  . Erythromycin Nausea And Vomiting and Other (See Comments)    Gi issues  . Gabapentin Swelling  . Nickel Itching and Rash    Breakouts   . Other Nausea And Vomiting and Other (See Comments)    Omnicef-GI issues    ROS: Pertinent items noted in HPI and remainder of comprehensive ROS otherwise negative.  HOME MEDS: Current Outpatient Medications on File Prior  to Visit  Medication Sig Dispense Refill  . acetaminophen (TYLENOL) 650 MG CR tablet Take 1,300 mg by mouth every 8 (eight) hours as needed for pain (pain).    . Ascorbic Acid (VITAMIN C) 1000 MG tablet Take 1,000 mg by mouth at bedtime.    [name] aspirin 81 MG tablet Take 81 mg by mouth daily.    . Azelastine HCl 0.15 % SOLN Place 1 spray into both nostrils 2 (two) times daily.    . B-D INS SYR HALF-UNIT .3CC/31G 31G X 5/16" 0.3 ML MISC 1 each by Other route See admin instructions. Use as directed 2 times daily with insulin    . carvedilol (COREG) 25 MG tablet Take 25 mg by mouth 2 (two) times daily.  3  . Cholecalciferol (VITAMIN D-3) 5000 UNITS TABS Take  5,000 Units by mouth daily.    [name] ezetimibe-simvastatin (VYTORIN) 10-40 MG per tablet Take 1 tablet by mouth daily.    . ferrous gluconate (FERGON) 324 MG tablet Take 324 mg by mouth daily with breakfast.    . fexofenadine (ALLEGRA) 180 MG tablet Take 180 mg by mouth daily.    [name] guaiFENesin (MUCINEX) 600 MG 12 hr tablet Take 600 mg by mouth daily.    [name] HUMULIN R U-500 KWIKPEN 500 UNIT/ML kwikpen Inject 5U into skin three times daily with meals  6  . Multiple Vitamins-Minerals (ALIVE WOMENS ENERGY PO) Take 1 tablet by mouth daily.    . nitroGLYCERIN (NITROSTAT) 0.4 MG SL tablet Place 1 tablet under the tongue as directed.  0  . ONE TOUCH ULTRA TEST test strip 1 each by Other route See admin instructions. Check blood sugar 3 times daily    . OVER THE COUNTER MEDICATION Take 1 tablet by mouth 2 (two) times daily. isoflabones 40 mg for hot flashes    . OVER THE COUNTER MEDICATION Take 1 tablet by mouth 2 (two) times daily. [place] -- Triple Joint Relief    . potassium chloride (KLOR-CON 10) 10 MEQ tablet Take 2 tablets (20 mEq total) by mouth 2 (two) times daily. 360 tablet 3  . ranitidine (ZANTAC) 150 MG tablet Take 1 tablet (150 mg total) by mouth 2 (two) times daily. 90 tablet 3  . sacubitril-valsartan (ENTRESTO) 97-103 MG Take 1 tablet by  mouth every morning.    . torsemide (DEMADEX) 20 MG tablet TAKE 1 TABLET BY MOUTH TWICE A DAY 60 tablet 4  . TRULICITY 1.5 ZH/0.8MV SOPN Inject 1.5 mg as directed once a week.    . vitamin E 400 UNIT capsule Take 400 Units by mouth daily.     No current facility-administered medications on file prior to visit.     LABS/IMAGING: No results found for this or any previous visit (from the past 48 hour(s)). No results found.  LIPID PANEL: No results found for: CHOL, TRIG, HDL, CHOLHDL, VLDL, LDLCALC, LDLDIRECT   WEIGHTS: Wt Readings from Last 3 Encounters:  11/25/17 166 lb 3.2 oz (75.4 kg)  08/30/17 167 lb 9.6 oz (76 kg)  05/27/17 158 lb (71.7 kg)    VITALS: BP 138/90   Pulse 72   Ht 5' (1.524 m)   Wt 166 lb 3.2 oz (75.4 kg)   BMI 32.46 kg/m   EXAM: General appearance: alert and no distress Neck: no carotid bruit, no JVD and thyroid not enlarged, symmetric, no tenderness/mass/nodules Lungs: clear to auscultation bilaterally Heart: regular rate and rhythm Abdomen: soft, non-tender; bowel sounds normal; no masses,  no organomegaly Extremities: edema 1+ edema Pulses: 2+ and symmetric Skin: Skin color, texture, turgor normal. No rashes or lesions Neurologic: Grossly normal Psych: Pleasant  EKG: Deferred  ASSESSMENT: 1. Acute systolic congestive heart failure, NYHA class II symptoms-LVEF 25-30% 2. CKD 3 3. Insulin-dependent diabetes 4. Dyslipidemia 5. Anemia 6. Palpitations/PVCs  PLAN: 1.   Belinda [name] seems to be stable and euvolemic at this point.  She has not been tolerating high-dose Entresto therefore we will decrease it to the 49/51 mg dose twice daily.  Pressure is well controlled.  Plan follow-up in 6 months with a repeat echo at that time.  Pixie Casino, MD, [facility], [place] Director of the [facility]  [facility] Attending Cardiologist  Direct Dial: [phone]  Fax:  [phone]  Website:  [email or web address] Nickol Collister 11/25/2017, 3:17 PM

## 2017-11-29 ENCOUNTER — Encounter: Payer: Self-pay | Admitting: *Deleted

## 2017-12-01 ENCOUNTER — Other Ambulatory Visit (HOSPITAL_COMMUNITY): Payer: Self-pay | Admitting: *Deleted

## 2017-12-02 ENCOUNTER — Ambulatory Visit (HOSPITAL_COMMUNITY)
Admission: RE | Admit: 2017-12-02 | Discharge: 2017-12-02 | Disposition: A | Discharge status: Home / Self Care | Payer: Medicare Other | Source: Ambulatory Visit | Attending: Nephrology | Admitting: Nephrology

## 2017-12-02 VITALS — BP 134/86 | HR 73 | Temp 98.3°F | Resp 18

## 2017-12-02 DIAGNOSIS — D631 Anemia in chronic kidney disease: Secondary | ICD-10-CM | POA: Diagnosis present

## 2017-12-02 DIAGNOSIS — N183 Chronic kidney disease, stage 3 unspecified: Secondary | ICD-10-CM

## 2017-12-02 LAB — POCT HEMOGLOBIN-HEMACUE: Hemoglobin: 10 g/dL — ABNORMAL LOW (ref 12.0–15.0)

## 2017-12-02 LAB — IRON AND TIBC
Iron: 55 ug/dL (ref 28–170)
Saturation Ratios: 17 % (ref 10.4–31.8)
TIBC: 321 ug/dL (ref 250–450)
UIBC: 266 ug/dL

## 2017-12-02 LAB — FERRITIN: Ferritin: 262 ng/mL (ref 11–307)

## 2017-12-02 MED ORDER — DARBEPOETIN ALFA 200 MCG/0.4ML IJ SOSY
200.0000 ug | PREFILLED_SYRINGE | INTRAMUSCULAR | Status: DC
  Administered 2017-12-02: 200 ug via SUBCUTANEOUS

## 2017-12-02 MED ORDER — DARBEPOETIN ALFA 200 MCG/0.4ML IJ SOSY
PREFILLED_SYRINGE | INTRAMUSCULAR | Status: AC
  Administered 2017-12-02: 200 ug via SUBCUTANEOUS
  Filled 2017-12-02: qty 0.4

## 2017-12-16 ENCOUNTER — Encounter (HOSPITAL_COMMUNITY): Payer: Medicare Other

## 2017-12-30 ENCOUNTER — Ambulatory Visit (HOSPITAL_COMMUNITY)
Admission: RE | Admit: 2017-12-30 | Discharge: 2017-12-30 | Disposition: A | Discharge status: Home / Self Care | Payer: Medicare Other | Source: Ambulatory Visit | Attending: Nephrology | Admitting: Nephrology

## 2017-12-30 VITALS — BP 139/93 | HR 73 | Temp 97.6°F | Ht 60.0 in | Wt 165.0 lb

## 2017-12-30 DIAGNOSIS — Z79899 Other long term (current) drug therapy: Secondary | ICD-10-CM | POA: Insufficient documentation

## 2017-12-30 DIAGNOSIS — D631 Anemia in chronic kidney disease: Secondary | ICD-10-CM | POA: Diagnosis not present

## 2017-12-30 DIAGNOSIS — N183 Chronic kidney disease, stage 3 unspecified: Secondary | ICD-10-CM

## 2017-12-30 DIAGNOSIS — Z5181 Encounter for therapeutic drug level monitoring: Secondary | ICD-10-CM | POA: Insufficient documentation

## 2017-12-30 LAB — IRON AND TIBC
Iron: 99 ug/dL (ref 28–170)
Saturation Ratios: 28 % (ref 10.4–31.8)
TIBC: 353 ug/dL (ref 250–450)
UIBC: 254 ug/dL

## 2017-12-30 LAB — POCT HEMOGLOBIN-HEMACUE: Hemoglobin: 10.2 g/dL — ABNORMAL LOW (ref 12.0–15.0)

## 2017-12-30 LAB — FERRITIN: Ferritin: 231 ng/mL (ref 11–307)

## 2017-12-30 MED ORDER — DARBEPOETIN ALFA 200 MCG/0.4ML IJ SOSY: 200.0000 ug | PREFILLED_SYRINGE | INTRAMUSCULAR | Status: DC

## 2017-12-30 MED ORDER — DARBEPOETIN ALFA 200 MCG/0.4ML IJ SOSY
PREFILLED_SYRINGE | INTRAMUSCULAR | Status: AC
  Administered 2017-12-30: 200 ug
  Filled 2017-12-30: qty 0.4

## 2018-01-13 ENCOUNTER — Encounter (HOSPITAL_COMMUNITY): Payer: Medicare Other

## 2018-01-27 ENCOUNTER — Encounter (HOSPITAL_COMMUNITY): Admission: RE | Admit: 2018-01-27 | Payer: Medicare Other | Source: Ambulatory Visit

## 2018-02-02 ENCOUNTER — Inpatient Hospital Stay (HOSPITAL_COMMUNITY): Payer: Medicare Other

## 2018-02-02 ENCOUNTER — Inpatient Hospital Stay (HOSPITAL_COMMUNITY)
Admission: EM | Admit: 2018-02-02 | Discharge: 2018-02-16 | DRG: 291 | Disposition: A | Discharge status: Skilled Nursing Facility | Payer: Medicare Other | Attending: Internal Medicine | Admitting: Internal Medicine

## 2018-02-02 ENCOUNTER — Emergency Department (HOSPITAL_COMMUNITY): Payer: Medicare Other

## 2018-02-02 ENCOUNTER — Encounter (HOSPITAL_COMMUNITY): Payer: Self-pay | Admitting: Emergency Medicine

## 2018-02-02 ENCOUNTER — Other Ambulatory Visit: Payer: Self-pay

## 2018-02-02 DIAGNOSIS — N179 Acute kidney failure, unspecified: Secondary | ICD-10-CM

## 2018-02-02 DIAGNOSIS — J45909 Unspecified asthma, uncomplicated: Secondary | ICD-10-CM | POA: Diagnosis present

## 2018-02-02 DIAGNOSIS — I5023 Acute on chronic systolic (congestive) heart failure: Secondary | ICD-10-CM | POA: Diagnosis not present

## 2018-02-02 DIAGNOSIS — I5031 Acute diastolic (congestive) heart failure: Secondary | ICD-10-CM | POA: Diagnosis not present

## 2018-02-02 DIAGNOSIS — E1100 Type 2 diabetes mellitus with hyperosmolarity without nonketotic hyperglycemic-hyperosmolar coma (NKHHC): Secondary | ICD-10-CM | POA: Diagnosis present

## 2018-02-02 DIAGNOSIS — I959 Hypotension, unspecified: Secondary | ICD-10-CM | POA: Diagnosis not present

## 2018-02-02 DIAGNOSIS — Z7982 Long term (current) use of aspirin: Secondary | ICD-10-CM

## 2018-02-02 DIAGNOSIS — I130 Hypertensive heart and chronic kidney disease with heart failure and stage 1 through stage 4 chronic kidney disease, or unspecified chronic kidney disease: Secondary | ICD-10-CM | POA: Diagnosis present

## 2018-02-02 DIAGNOSIS — Z881 Allergy status to other antibiotic agents status: Secondary | ICD-10-CM

## 2018-02-02 DIAGNOSIS — E1122 Type 2 diabetes mellitus with diabetic chronic kidney disease: Secondary | ICD-10-CM | POA: Diagnosis present

## 2018-02-02 DIAGNOSIS — E1165 Type 2 diabetes mellitus with hyperglycemia: Secondary | ICD-10-CM | POA: Diagnosis not present

## 2018-02-02 DIAGNOSIS — Z8701 Personal history of pneumonia (recurrent): Secondary | ICD-10-CM

## 2018-02-02 DIAGNOSIS — I440 Atrioventricular block, first degree: Secondary | ICD-10-CM | POA: Diagnosis present

## 2018-02-02 DIAGNOSIS — Z8249 Family history of ischemic heart disease and other diseases of the circulatory system: Secondary | ICD-10-CM

## 2018-02-02 DIAGNOSIS — G8929 Other chronic pain: Secondary | ICD-10-CM | POA: Diagnosis present

## 2018-02-02 DIAGNOSIS — J9600 Acute respiratory failure, unspecified whether with hypoxia or hypercapnia: Secondary | ICD-10-CM | POA: Diagnosis present

## 2018-02-02 DIAGNOSIS — Z9109 Other allergy status, other than to drugs and biological substances: Secondary | ICD-10-CM

## 2018-02-02 DIAGNOSIS — Z961 Presence of intraocular lens: Secondary | ICD-10-CM | POA: Diagnosis present

## 2018-02-02 DIAGNOSIS — E785 Hyperlipidemia, unspecified: Secondary | ICD-10-CM | POA: Diagnosis present

## 2018-02-02 DIAGNOSIS — T502X5A Adverse effect of carbonic-anhydrase inhibitors, benzothiadiazides and other diuretics, initial encounter: Secondary | ICD-10-CM | POA: Diagnosis not present

## 2018-02-02 DIAGNOSIS — N170 Acute kidney failure with tubular necrosis: Secondary | ICD-10-CM | POA: Diagnosis not present

## 2018-02-02 DIAGNOSIS — M7989 Other specified soft tissue disorders: Secondary | ICD-10-CM | POA: Diagnosis not present

## 2018-02-02 DIAGNOSIS — B961 Klebsiella pneumoniae [K. pneumoniae] as the cause of diseases classified elsewhere: Secondary | ICD-10-CM | POA: Diagnosis present

## 2018-02-02 DIAGNOSIS — I509 Heart failure, unspecified: Secondary | ICD-10-CM | POA: Diagnosis not present

## 2018-02-02 DIAGNOSIS — E854 Organ-limited amyloidosis: Secondary | ICD-10-CM | POA: Diagnosis present

## 2018-02-02 DIAGNOSIS — Z9841 Cataract extraction status, right eye: Secondary | ICD-10-CM

## 2018-02-02 DIAGNOSIS — Z79899 Other long term (current) drug therapy: Secondary | ICD-10-CM

## 2018-02-02 DIAGNOSIS — Z794 Long term (current) use of insulin: Secondary | ICD-10-CM

## 2018-02-02 DIAGNOSIS — G43909 Migraine, unspecified, not intractable, without status migrainosus: Secondary | ICD-10-CM | POA: Diagnosis present

## 2018-02-02 DIAGNOSIS — E11649 Type 2 diabetes mellitus with hypoglycemia without coma: Secondary | ICD-10-CM | POA: Diagnosis not present

## 2018-02-02 DIAGNOSIS — E861 Hypovolemia: Secondary | ICD-10-CM | POA: Diagnosis not present

## 2018-02-02 DIAGNOSIS — E876 Hypokalemia: Secondary | ICD-10-CM | POA: Diagnosis present

## 2018-02-02 DIAGNOSIS — Z6839 Body mass index (BMI) 39.0-39.9, adult: Secondary | ICD-10-CM | POA: Diagnosis not present

## 2018-02-02 DIAGNOSIS — I313 Pericardial effusion (noninflammatory): Secondary | ICD-10-CM | POA: Diagnosis present

## 2018-02-02 DIAGNOSIS — G4733 Obstructive sleep apnea (adult) (pediatric): Secondary | ICD-10-CM | POA: Diagnosis present

## 2018-02-02 DIAGNOSIS — N189 Chronic kidney disease, unspecified: Secondary | ICD-10-CM

## 2018-02-02 DIAGNOSIS — T383X5A Adverse effect of insulin and oral hypoglycemic [antidiabetic] drugs, initial encounter: Secondary | ICD-10-CM | POA: Diagnosis not present

## 2018-02-02 DIAGNOSIS — E160 Drug-induced hypoglycemia without coma: Secondary | ICD-10-CM | POA: Diagnosis not present

## 2018-02-02 DIAGNOSIS — E669 Obesity, unspecified: Secondary | ICD-10-CM | POA: Diagnosis present

## 2018-02-02 DIAGNOSIS — L89892 Pressure ulcer of other site, stage 2: Secondary | ICD-10-CM | POA: Diagnosis present

## 2018-02-02 DIAGNOSIS — R296 Repeated falls: Secondary | ICD-10-CM | POA: Diagnosis present

## 2018-02-02 DIAGNOSIS — N390 Urinary tract infection, site not specified: Secondary | ICD-10-CM | POA: Diagnosis present

## 2018-02-02 DIAGNOSIS — I10 Essential (primary) hypertension: Secondary | ICD-10-CM | POA: Diagnosis not present

## 2018-02-02 DIAGNOSIS — Z880 Allergy status to penicillin: Secondary | ICD-10-CM

## 2018-02-02 DIAGNOSIS — N184 Chronic kidney disease, stage 4 (severe): Secondary | ICD-10-CM | POA: Diagnosis not present

## 2018-02-02 DIAGNOSIS — Z90710 Acquired absence of both cervix and uterus: Secondary | ICD-10-CM

## 2018-02-02 DIAGNOSIS — I340 Nonrheumatic mitral (valve) insufficiency: Secondary | ICD-10-CM | POA: Diagnosis not present

## 2018-02-02 DIAGNOSIS — K219 Gastro-esophageal reflux disease without esophagitis: Secondary | ICD-10-CM | POA: Diagnosis present

## 2018-02-02 DIAGNOSIS — I9589 Other hypotension: Secondary | ICD-10-CM | POA: Diagnosis not present

## 2018-02-02 DIAGNOSIS — N183 Chronic kidney disease, stage 3 (moderate): Secondary | ICD-10-CM | POA: Diagnosis not present

## 2018-02-02 DIAGNOSIS — R52 Pain, unspecified: Secondary | ICD-10-CM

## 2018-02-02 DIAGNOSIS — I5021 Acute systolic (congestive) heart failure: Secondary | ICD-10-CM | POA: Diagnosis present

## 2018-02-02 DIAGNOSIS — Z9842 Cataract extraction status, left eye: Secondary | ICD-10-CM

## 2018-02-02 DIAGNOSIS — L8990 Pressure ulcer of unspecified site, unspecified stage: Secondary | ICD-10-CM

## 2018-02-02 DIAGNOSIS — R0602 Shortness of breath: Secondary | ICD-10-CM | POA: Diagnosis present

## 2018-02-02 DIAGNOSIS — Z888 Allergy status to other drugs, medicaments and biological substances status: Secondary | ICD-10-CM

## 2018-02-02 DIAGNOSIS — I5043 Acute on chronic combined systolic (congestive) and diastolic (congestive) heart failure: Secondary | ICD-10-CM | POA: Diagnosis present

## 2018-02-02 DIAGNOSIS — Z886 Allergy status to analgesic agent status: Secondary | ICD-10-CM

## 2018-02-02 DIAGNOSIS — Z96652 Presence of left artificial knee joint: Secondary | ICD-10-CM | POA: Diagnosis present

## 2018-02-02 DIAGNOSIS — E1121 Type 2 diabetes mellitus with diabetic nephropathy: Secondary | ICD-10-CM | POA: Diagnosis not present

## 2018-02-02 DIAGNOSIS — M170 Bilateral primary osteoarthritis of knee: Secondary | ICD-10-CM | POA: Diagnosis present

## 2018-02-02 DIAGNOSIS — M542 Cervicalgia: Secondary | ICD-10-CM | POA: Diagnosis present

## 2018-02-02 DIAGNOSIS — R739 Hyperglycemia, unspecified: Secondary | ICD-10-CM

## 2018-02-02 DIAGNOSIS — E1129 Type 2 diabetes mellitus with other diabetic kidney complication: Secondary | ICD-10-CM | POA: Diagnosis present

## 2018-02-02 DIAGNOSIS — Z8614 Personal history of Methicillin resistant Staphylococcus aureus infection: Secondary | ICD-10-CM

## 2018-02-02 HISTORY — DX: Cervicalgia: M54.2

## 2018-02-02 HISTORY — DX: Chronic kidney disease, stage 3 unspecified: N18.30

## 2018-02-02 HISTORY — DX: Chronic kidney disease, stage 3 (moderate): N18.3

## 2018-02-02 HISTORY — DX: Pneumonia, unspecified organism: J18.9

## 2018-02-02 HISTORY — DX: Personal history of other medical treatment: Z92.89

## 2018-02-02 HISTORY — DX: Other chronic pain: G89.29

## 2018-02-02 HISTORY — DX: Other seasonal allergic rhinitis: J30.2

## 2018-02-02 HISTORY — DX: Heart failure, unspecified: I50.9

## 2018-02-02 HISTORY — DX: Type 2 diabetes mellitus without complications: E11.9

## 2018-02-02 LAB — TROPONIN I: Troponin I: 0.03 ng/mL (ref <0.03)

## 2018-02-02 LAB — CBC
HCT: 38.1 % (ref 36.0–46.0)
HCT: 39.6 % (ref 36.0–46.0)
Hemoglobin: 12.8 g/dL (ref 12.0–15.0)
Hemoglobin: 13.4 g/dL (ref 12.0–15.0)
MCH: 25.5 pg — ABNORMAL LOW (ref 26.0–34.0)
MCH: 26 pg (ref 26.0–34.0)
MCHC: 33.6 g/dL (ref 30.0–36.0)
MCHC: 33.8 g/dL (ref 30.0–36.0)
MCV: 76 fL — ABNORMAL LOW (ref 78.0–100.0)
MCV: 76.7 fL — ABNORMAL LOW (ref 78.0–100.0)
Platelets: 192 10*3/uL (ref 150–400)
Platelets: 189 10*3/uL (ref 150–400)
RBC: 5.01 MIL/uL (ref 3.87–5.11)
RBC: 5.16 MIL/uL — ABNORMAL HIGH (ref 3.87–5.11)
RDW: 21.8 % — ABNORMAL HIGH (ref 11.5–15.5)
RDW: 21.3 % — ABNORMAL HIGH (ref 11.5–15.5)
WBC: 8.8 10*3/uL (ref 4.0–10.5)
WBC: 7.9 10*3/uL (ref 4.0–10.5)

## 2018-02-02 LAB — BASIC METABOLIC PANEL
Anion gap: 15 (ref 5–15)
BUN: 39 mg/dL — ABNORMAL HIGH (ref 8–23)
CO2: 20 mmol/L — ABNORMAL LOW (ref 22–32)
Calcium: 9 mg/dL (ref 8.9–10.3)
Chloride: 101 mmol/L (ref 98–111)
Creatinine, Ser: 2.6 mg/dL — ABNORMAL HIGH (ref 0.44–1.00)
GFR calc Af Amer: 21 mL/min — ABNORMAL LOW (ref >60)
GFR calc non Af Amer: 18 mL/min — ABNORMAL LOW (ref >60)
Glucose, Bld: 652 mg/dL (ref 70–99)
Potassium: 4.1 mmol/L (ref 3.5–5.1)
Sodium: 136 mmol/L (ref 135–145)

## 2018-02-02 LAB — I-STAT TROPONIN, ED: Troponin i, poc: 0.01 ng/mL (ref 0.00–0.08)

## 2018-02-02 LAB — CREATININE, SERUM
Creatinine, Ser: 2.58 mg/dL — ABNORMAL HIGH (ref 0.44–1.00)
GFR calc Af Amer: 21 mL/min — ABNORMAL LOW (ref >60)
GFR calc non Af Amer: 18 mL/min — ABNORMAL LOW (ref >60)

## 2018-02-02 LAB — TSH: TSH: 1.993 u[IU]/mL (ref 0.350–4.500)

## 2018-02-02 LAB — HEMOGLOBIN A1C
Hgb A1c MFr Bld: 12.3 % — ABNORMAL HIGH (ref 4.8–5.6)
Mean Plasma Glucose: 306.31 mg/dL

## 2018-02-02 LAB — MAGNESIUM: Magnesium: 1.7 mg/dL (ref 1.7–2.4)

## 2018-02-02 LAB — GLUCOSE, CAPILLARY: Glucose-Capillary: >600 mg/dL (ref 70–99)

## 2018-02-02 LAB — BRAIN NATRIURETIC PEPTIDE: B Natriuretic Peptide: >4500 pg/mL — ABNORMAL HIGH (ref 0.0–100.0)

## 2018-02-02 MED ORDER — ONDANSETRON HCL 4 MG PO TABS: 4.0000 mg | ORAL_TABLET | Freq: Four times a day (QID) | ORAL | Status: DC | PRN

## 2018-02-02 MED ORDER — FUROSEMIDE 10 MG/ML IJ SOLN
80.0000 mg | Freq: Two times a day (BID) | INTRAMUSCULAR | Status: DC
  Administered 2018-02-03 (×2): 80 mg via INTRAVENOUS
  Filled 2018-02-02 (×3): qty 8

## 2018-02-02 MED ORDER — DEXTROSE 50 % IV SOLN
25.0000 mL | INTRAVENOUS | Status: DC | PRN
  Administered 2018-02-04: 25 mL via INTRAVENOUS
  Administered 2018-02-14: 50 mL via INTRAVENOUS
  Administered 2018-02-16: 25 mL via INTRAVENOUS
  Filled 2018-02-02: qty 50

## 2018-02-02 MED ORDER — INSULIN REGULAR HUMAN 100 UNIT/ML IJ SOLN
INTRAMUSCULAR | Status: AC
  Administered 2018-02-03: 3.8 [IU]/h via INTRAVENOUS
  Administered 2018-02-03: 13.2 [IU]/h via INTRAVENOUS
  Administered 2018-02-03: 8 [IU]/h via INTRAVENOUS
  Administered 2018-02-03: 5.4 [IU]/h via INTRAVENOUS
  Filled 2018-02-02 (×2): qty 1

## 2018-02-02 MED ORDER — FUROSEMIDE 10 MG/ML IJ SOLN
40.0000 mg | Freq: Once | INTRAMUSCULAR | Status: AC
  Administered 2018-02-02: 40 mg via INTRAVENOUS
  Filled 2018-02-02: qty 4

## 2018-02-02 MED ORDER — DEXTROSE-NACL 5-0.45 % IV SOLN
INTRAVENOUS | Status: DC
  Administered 2018-02-02 – 2018-02-16 (×3): via INTRAVENOUS

## 2018-02-02 MED ORDER — SODIUM CHLORIDE 0.9 % IV SOLN
INTRAVENOUS | Status: DC
  Administered 2018-02-02 – 2018-02-13 (×2): via INTRAVENOUS

## 2018-02-02 MED ORDER — NITROGLYCERIN 0.4 MG SL SUBL: 0.4000 mg | SUBLINGUAL_TABLET | SUBLINGUAL | Status: DC

## 2018-02-02 MED ORDER — CARVEDILOL 25 MG PO TABS
25.0000 mg | ORAL_TABLET | Freq: Two times a day (BID) | ORAL | Status: DC
  Administered 2018-02-02 – 2018-02-05 (×5): 25 mg via ORAL
  Filled 2018-02-02 (×6): qty 1

## 2018-02-02 MED ORDER — HEPARIN SODIUM (PORCINE) 5000 UNIT/ML IJ SOLN
5000.0000 [IU] | Freq: Three times a day (TID) | INTRAMUSCULAR | Status: DC
  Administered 2018-02-02 – 2018-02-16 (×41): 5000 [IU] via SUBCUTANEOUS
  Filled 2018-02-02 (×41): qty 1

## 2018-02-02 MED ORDER — INSULIN REGULAR HUMAN (CONC) 500 UNIT/ML ~~LOC~~ SOPN
5.0000 [IU] | PEN_INJECTOR | Freq: Three times a day (TID) | SUBCUTANEOUS | Status: DC
  Filled 2018-02-02: qty 3

## 2018-02-02 MED ORDER — FUROSEMIDE 10 MG/ML IJ SOLN
40.0000 mg | Freq: Once | INTRAMUSCULAR | Status: AC
Start: 2018-02-02 — End: 2018-02-02
  Administered 2018-02-02: 40 mg via INTRAVENOUS
  Filled 2018-02-02: qty 4

## 2018-02-02 MED ORDER — ONDANSETRON HCL 4 MG/2ML IJ SOLN: 4.0000 mg | Freq: Four times a day (QID) | INTRAMUSCULAR | Status: DC | PRN

## 2018-02-02 MED ORDER — EZETIMIBE-SIMVASTATIN 10-40 MG PO TABS
1.0000 | ORAL_TABLET | Freq: Every day | ORAL | Status: DC
  Administered 2018-02-02 – 2018-02-16 (×15): 1 via ORAL
  Filled 2018-02-02 (×15): qty 1

## 2018-02-02 MED ORDER — POTASSIUM CHLORIDE CRYS ER 10 MEQ PO TBCR
20.0000 meq | EXTENDED_RELEASE_TABLET | Freq: Two times a day (BID) | ORAL | Status: DC
  Administered 2018-02-02 – 2018-02-04 (×5): 20 meq via ORAL
  Filled 2018-02-02 (×8): qty 2

## 2018-02-02 MED ORDER — INSULIN REGULAR BOLUS VIA INFUSION
0.0000 [IU] | Freq: Three times a day (TID) | INTRAVENOUS | Status: AC
  Filled 2018-02-02: qty 10

## 2018-02-02 MED ORDER — ACETAMINOPHEN 325 MG PO TABS
650.0000 mg | ORAL_TABLET | Freq: Four times a day (QID) | ORAL | Status: DC | PRN
  Administered 2018-02-03 – 2018-02-12 (×15): 650 mg via ORAL
  Filled 2018-02-02 (×16): qty 2

## 2018-02-02 MED ORDER — INSULIN ASPART 100 UNIT/ML ~~LOC~~ SOLN
10.0000 [IU] | Freq: Once | SUBCUTANEOUS | Status: AC
  Administered 2018-02-02: 10 [IU] via SUBCUTANEOUS
  Filled 2018-02-02 (×2): qty 1

## 2018-02-02 MED ORDER — ACETAMINOPHEN 650 MG RE SUPP: 650.0000 mg | Freq: Four times a day (QID) | RECTAL | Status: DC | PRN

## 2018-02-02 MED ORDER — INSULIN ASPART 100 UNIT/ML ~~LOC~~ SOLN: 0.0000 [IU] | Freq: Three times a day (TID) | SUBCUTANEOUS | Status: DC

## 2018-02-02 NOTE — ED Triage Notes (Signed)
Pt to ER for evaluation of fluid overload and shortness of breath. Reports she didn't take her fluid meds due to having to hurry to the restroom and not being able to stand due to fluid in her legs, patient is sob at this time. BP 172/120, SPO2 91% on RA, pt placed on 2 L nasal cannula.

## 2018-02-02 NOTE — Progress Notes (Signed)
Received report from ED nurse about patient coming to [facility]. Lajoyce Corners, RN

## 2018-02-02 NOTE — Progress Notes (Signed)
Patient arrived from ED to [facility]. Patient alert and oriented. Given CHG bath. Patient placed on telemetry. Oriented to unit. Given call bell and bed placed in lowest position. Will continue to monitor. Lajoyce Corners, RN

## 2018-02-02 NOTE — ED Notes (Signed)
Glucose of 652, notified by lab

## 2018-02-02 NOTE — ED Provider Notes (Signed)
Patient placed in Quick Look pathway, seen and evaluated   Chief Complaint: Shortness of breath  HPI:   Increasing shortness of breath and peripheral edema over the past week.  Has not been using her diuretic for the past few days due to it making her go to the bathroom a lot.  ROS: Shortness of breath (one)  Physical Exam:   Gen: No distress  Neuro: Awake and Alert  Skin: Warm    Focused Exam:   No diaphoresis.  No pallor.  Pulmonary: Some increased work of breathing and tachypnea.  Lung sounds clear.  Requiring supplemental O2 due to SPO2 at 91%.  Cardiac: No tachycardia noted.  MSK: Bilateral lower extremity edema present.   Initiation of care has begun. The patient has been counseled on the process, plan, and necessity for staying for the completion/evaluation, and the remainder of the medical screening examination   Layla Maw 02/02/18 1438    Quintella Reichert, MD 02/02/18 [phone]

## 2018-02-02 NOTE — H&P (Signed)
History and Physical    Belinda [name] SWH:[id number] DOB: Mar 18, 1951 DOA: 02/02/2018  PCP: Reynold Bowen, MD  Patient coming from: Home.  Chief Complaint: Shortness of breath.  HPI: Belinda [name] is a 67 y.o. female with history of systolic heart failure, diabetes mellitus type 2, chronic kidney disease stage III-IV presents to the ER with complaints of increasing shortness of breath over the last few days.  Patient states she has been getting progressively short of breath on exertion and at rest and lying down.  Denies any chest pain.  Last few days she has not taken her Demadex due to difficulty walking from shortness of breath.  Patient states that her blood sugars has been running high since her Trulicity was discontinued 2 months ago.  ED Course: In the ER patient has significant peripheral edema and chest x-ray shows consolidation versus congestion.  BNP is more than 4500.  EKG shows normal sinus rhythm.  Was given Lasix 80 mg IV admitted for acute CHF.  Patient's blood sugar was around 652 at presentation.  Anion gap is 15.  Review of Systems: As per HPI, rest all negative.   Past Medical History:  Diagnosis Date  . Acute CHF (congestive heart failure) ([place]) 02/02/2018  . Anemia    "get monthly injections to boost my HgB" (02/02/2018)  . Anxiety   . Asthma   . Back pain    intractable low back  . Chronic neck pain   . CKD (chronic kidney disease), stage III ([place])   . Dysrhythmia    palpitations or heart racing periodic. has had for years- Dr Mare Ferrari follows.  [name] GERD (gastroesophageal reflux disease)   . History of blood transfusion 2016   After knee replacemnet  . Hx MRSA infection   . Hyperlipidemia   . Hypertension   . Migraines    "~ 2/month" (02/02/2018)  . Osteoarthritis    "knees, neck" (02/02/2018)  . Palpitations   . Pneumonia    "twice in 2018" (02/02/2018)  . Seasonal allergies   . Type II diabetes mellitus (HCC)    insulin dependent - fasting 140-200       Past Surgical History:  Procedure Laterality Date  . ABDOMINAL HYSTERECTOMY  03/2001   Abdominal supracervical hysterectomy, left salpingo-oophorectomy  . ANTERIOR CERVICAL DECOMP/DISCECTOMY FUSION N/A 01/01/2014   Procedure: CERVICAL THREE-FOUR,CERVICAL FIVE-SIX,CERVICAL SIX-SEVEN ANTERIOR CERVICAL DECOMPRESSION/DISCECTOMY/FUSION;  Surgeon: Kristeen Miss, MD;  Location: [place] [facility];  Service: Neurosurgery;  Laterality: N/A;  left-side approach  . BACK SURGERY    . CATARACT EXTRACTION W/ INTRAOCULAR LENS  IMPLANT, BILATERAL Bilateral 2012  . CESAREAN SECTION    . DILATION AND CURETTAGE OF UTERUS  X 2  . HAND SURGERY Left 1970s   laceration repair 4th and 5th digits  . JOINT REPLACEMENT    . KNEE ARTHROSCOPY Left    torn mensicus  . NASAL SINUS SURGERY    . TOTAL KNEE ARTHROPLASTY Left 02/12/2015   Procedure: TOTAL KNEE ARTHROPLASTY;  Surgeon: [name], MD;  Location: [facility];  Service: Orthopedics;  Laterality: Left;     reports that she has never smoked. She has never used smokeless tobacco. She reports that she does not drink alcohol or use drugs.  Allergies  Allergen Reactions  . Aspirin Other (See Comments)    Can tolerate low aspirin  . Augmentin [Amoxicillin-Pot Clavulanate] Nausea And Vomiting and Other (See Comments)    GI issues  . Erythromycin Nausea And Vomiting and Other (See  Comments)    Gi issues  . Gabapentin Swelling  . Nickel Itching and Rash    Breakouts   . Other Nausea And Vomiting and Other (See Comments)    Omnicef-GI issues    Family History  Problem Relation Age of Onset  . Heart disease Mother   . Heart failure Father   . Colon cancer Neg Hx   . Stomach cancer Neg Hx   . Esophageal cancer Neg Hx     Prior to Admission medications   Medication Sig Start Date End Date Taking? Authorizing Provider  acetaminophen (TYLENOL) 650 MG CR tablet Take 1,300 mg by mouth every 8 (eight) hours as needed for pain (pain).    [provider]   Ascorbic Acid (VITAMIN C) 1000 MG tablet Take 1,000 mg by mouth at bedtime.    [provider]  aspirin 81 MG tablet Take 81 mg by mouth daily.    [provider]  Azelastine HCl 0.15 % SOLN Place 1 spray into both nostrils 2 (two) times daily.    [provider]  B-D INS SYR HALF-UNIT .3CC/31G 31G X 5/16" 0.3 ML MISC 1 each by Other route See admin instructions. Use as directed 2 times daily with insulin 05/11/13   [provider]  carvedilol (COREG) 25 MG tablet Take 25 mg by mouth 2 (two) times daily. 02/24/17   [provider]  Cholecalciferol (VITAMIN D-3) 5000 UNITS TABS Take 5,000 Units by mouth daily.    [provider]  ezetimibe-simvastatin (VYTORIN) 10-40 MG per tablet Take 1 tablet by mouth daily.    [provider]  ferrous gluconate (FERGON) 324 MG tablet Take 324 mg by mouth daily with breakfast.    [provider]  fexofenadine (ALLEGRA) 180 MG tablet Take 180 mg by mouth daily.    [provider]  guaiFENesin (MUCINEX) 600 MG 12 hr tablet Take 600 mg by mouth daily.    [provider]  HUMULIN R U-500 KWIKPEN 500 UNIT/ML kwikpen Inject 5U into skin three times daily with meals 04/02/15   [provider]  Multiple Vitamins-Minerals (ALIVE WOMENS ENERGY PO) Take 1 tablet by mouth daily.    [provider]  nitroGLYCERIN (NITROSTAT) 0.4 MG SL tablet Place 1 tablet under the tongue as directed. 11/14/17   [provider]  ONE TOUCH ULTRA TEST test strip 1 each by Other route See admin instructions. Check blood sugar 3 times daily 05/11/13   [provider]  OVER THE COUNTER MEDICATION Take 1 tablet by mouth 2 (two) times daily. isoflabones 40 mg for hot flashes    [provider]  OVER THE COUNTER MEDICATION Take 1 tablet by mouth 2 (two) times daily. [place] -- Triple Joint Relief    [provider]  potassium chloride (KLOR-CON 10) 10  MEQ tablet Take 2 tablets (20 mEq total) by mouth 2 (two) times daily. 06/10/17   Nahser, Wonda Cheng, MD  ranitidine (ZANTAC) 150 MG tablet Take 1 tablet (150 mg total) by mouth 2 (two) times daily. 04/05/17   Armbruster, Carlota Raspberry, MD  sacubitril-valsartan (ENTRESTO) 49-51 MG Take 1 tablet by mouth 2 (two) times daily. 11/25/17   Pixie Casino, MD  torsemide (DEMADEX) 20 MG tablet TAKE 1 TABLET BY MOUTH TWICE A DAY 09/27/17   Hilty, Nadean Corwin, MD  TRULICITY 1.5 BH/4.1PF SOPN Inject 1.5 mg as directed once a week. 05/17/16   [provider]  vitamin E 400 UNIT  capsule Take 400 Units by mouth daily.    [provider]    Physical Exam: Vitals:   02/02/18 1845 02/02/18 1923 02/02/18 2030 02/02/18 2116  BP: (!) 170/119 (!) 164/113 (!) 158/109 (!) 163/110  Pulse: 98 100 95 100  Resp:  (!) 22  (!) 22  Temp:    97.6 F (36.4 C)  TempSrc:    Oral  SpO2: 100% 100% 100% 100%      Constitutional: Moderately built and nourished. Vitals:   02/02/18 1845 02/02/18 1923 02/02/18 2030 02/02/18 2116  BP: (!) 170/119 (!) 164/113 (!) 158/109 (!) 163/110  Pulse: 98 100 95 100  Resp:  (!) 22  (!) 22  Temp:    97.6 F (36.4 C)  TempSrc:    Oral  SpO2: 100% 100% 100% 100%   Eyes: Anicteric no pallor. ENMT: No discharge from the ears eyes normal. Neck: JVD elevated no mass felt. Respiratory: No rhonchi or crepitations. Cardiovascular: S1-S2 heard no murmurs appreciated. Abdomen: Soft nontender bowel sounds present. Musculoskeletal: Bilateral lower extremity edema present. Skin: No rash. Neurologic: Alert awake oriented to time place and person.  Moves all extremities. Psychiatric: Appears normal per normal affect.   Labs on Admission: I have personally reviewed following labs and imaging studies  CBC: Recent Labs  Lab 02/02/18 1501  WBC 8.8  HGB 12.8  HCT 38.1  MCV 76.0*  PLT 546   Basic Metabolic Panel: Recent Labs  Lab 02/02/18 1501  NA 136  K 4.1  CL 101  CO2  20*  GLUCOSE 652*  BUN 39*  CREATININE 2.60*  CALCIUM 9.0   GFR: CrCl cannot be calculated (Unknown ideal weight.). Liver Function Tests: No results for input(s): AST, ALT, ALKPHOS, BILITOT, PROT, ALBUMIN in the last 168 hours. No results for input(s): LIPASE, AMYLASE in the last 168 hours. No results for input(s): AMMONIA in the last 168 hours. Coagulation Profile: No results for input(s): INR, PROTIME in the last 168 hours. Cardiac Enzymes: No results for input(s): CKTOTAL, CKMB, CKMBINDEX, TROPONINI in the last 168 hours. BNP (last 3 results) Recent Labs    04/26/17 1246  PROBNP 4,620*   HbA1C: No results for input(s): HGBA1C in the last 72 hours. CBG: Recent Labs  Lab 02/02/18 2158  GLUCAP >600*   Lipid Profile: No results for input(s): CHOL, HDL, LDLCALC, TRIG, CHOLHDL, LDLDIRECT in the last 72 hours. Thyroid Function Tests: No results for input(s): TSH, T4TOTAL, FREET4, T3FREE, THYROIDAB in the last 72 hours. Anemia Panel: No results for input(s): VITAMINB12, FOLATE, FERRITIN, TIBC, IRON, RETICCTPCT in the last 72 hours. Urine analysis: No results found for: COLORURINE, APPEARANCEUR, LABSPEC, PHURINE, GLUCOSEU, HGBUR, BILIRUBINUR, KETONESUR, PROTEINUR, UROBILINOGEN, NITRITE, LEUKOCYTESUR Sepsis Labs: @LABRCNTIP (procalcitonin:4,lacticidven:4) )No results found for this or any previous visit (from the past 240 hour(s)).   Radiological Exams on Admission: Dg Chest 2 View  Result Date: 02/02/2018 CLINICAL DATA:  67 year old female with a history of shortness of breath and lower extremity swelling for 1 week EXAM: CHEST - 2 VIEW COMPARISON:  12/17/2013 FINDINGS: Cardiomediastinal silhouette is increased as compared to the prior plain film. No pneumothorax. No pleural effusion. Lateral view demonstrates questionable new patchy opacities at the lung base. Low lung volumes. IMPRESSION: New cardiomegaly compared to the prior plain film of 3 years ago. Patchy airspace  opacities at the lung bases, potentially atelectasis and/or consolidation. Correlation with lab values and patient presentation may be useful. Electronically Signed   By: Corrie Mckusick D.O.   On: 02/02/2018  16:07    EKG: Independently reviewed.  Normal sinus rhythm low voltage.  Assessment/Plan Active Problems:   Acute systolic (congestive) heart failure (HCC)   Essential hypertension   Acute CHF (congestive heart failure) (HCC)   DM (diabetes mellitus), type 2 with renal complications ([place])    1. Acute on chronic systolic heart failure last EF measured in November 2018 was 25 to 30% with grade 2 diastolic dysfunction.  I have placed patient on Lasix 80 mg IV every 12.  Closely follow intake output metabolic panel and daily weights.  Holding of Entresto due to chronic kidney disease stage IV at this time.  Mildly elevated troponin likely from CHF we will cycle. 2. Uncontrolled diabetes mellitus type 2 with hyperosmolar status -patient was given NovoLog 10 units in the ER.  Patient usually takes concentrated insulin 5 units 3 times daily.  If blood sugar does not improve in another few hours then will start glucose stabilizer. 3. Chronic kidney disease stage IV -holding Entresto for now.  Closely follow intake output metabolic panel. 4. Hypertension -since I am holding Entresto will keep patient on PRN IV hydralazine.   DVT prophylaxis: Heparin. Code Status: Full code. Family Communication: Discussed with patient. Disposition Plan: Home. Consults called: None. Admission status: Inpatient.   Rise Patience MD [facility] Pager [phone].  If 7PM-7AM, please contact night-coverage [email or web address] Password [email or web address]  02/02/2018, 10:01 PM

## 2018-02-02 NOTE — ED Notes (Signed)
ED Provider at bedside. 

## 2018-02-02 NOTE — ED Notes (Signed)
Hal Hope, MD at bedside

## 2018-02-02 NOTE — ED Provider Notes (Signed)
[facility] EMERGENCY DEPARTMENT Provider Note   CSN: [id number] Arrival date & time: 02/02/18  1425     History   Chief Complaint Chief Complaint  Patient presents with  . Shortness of Breath    HPI Belinda [name] is a 67 y.o. female hx of DM, GERD, HL, HTN, CHF (EF 25%), here with SOB, leg swelling. Patient states that she has worsening shortness of breath at night for the past week. She also has SOB during the day with minimal exertion over the last several days. She has worsening leg swelling as well. She states that she couldn't get to the bathroom in time so decided to stop taking her torsemide several days ago. She has some cough as well but no fever or vomiting. She was noted to be tachypneic and borderline hypoxic to 90% on RA was on put on oxygen in triage. Not on oxygen at baseline. She also was noted to be hyperglycemic at home. She states that she is compliant with her sliding scale. Her blood sugar has been rising over the last few months after her PCP stopped trulicity.   The history is provided by the patient.    Past Medical History:  Diagnosis Date  . Acute CHF (congestive heart failure) ([place]) 02/02/2018  . Anemia    "get monthly injections to boost my HgB" (02/02/2018)  . Anxiety   . Asthma   . Back pain    intractable low back  . Chronic neck pain   . CKD (chronic kidney disease), stage III ([place])   . Dysrhythmia    palpitations or heart racing periodic. has had for years- Dr Mare Ferrari follows.  [name] GERD (gastroesophageal reflux disease)   . History of blood transfusion 2016   After knee replacemnet  . Hx MRSA infection   . Hyperlipidemia   . Hypertension   . Migraines    "~ 2/month" (02/02/2018)  . Osteoarthritis    "knees, neck" (02/02/2018)  . Palpitations   . Pneumonia    "twice in 2018" (02/02/2018)  . Seasonal allergies   . Type II diabetes mellitus ([facility])    insulin dependent - fasting 140-200     Patient Active Problem List   Diagnosis Date Noted  . Acute CHF (congestive heart failure) ([place]) 02/02/2018  . DM (diabetes mellitus), type 2 with renal complications ([place]) 29/93/7169  . Bilateral lower extremity edema 11/25/2017  . Chronic systolic (congestive) heart failure ([place]) 08/30/2017  . Acute systolic (congestive) heart failure ([place]) 05/30/2017  . Essential hypertension 05/30/2017  . Congestive heart failure ([place]) 04/26/2017  . Chronic kidney disease 04/26/2017  . DJD (degenerative joint disease) of knee 02/12/2015  . Frequent PVCs 11/12/2014  . Cervical spondylosis with myelopathy and radiculopathy 01/02/2014  . Cervical spondylosis with myelopathy 01/01/2014  . Wound check, abscess 06/14/2013  . Cellulitis and abscess of buttock - right 06/12/2013  . Dyspnea on exertion 03/22/2011  . Chest pain 03/22/2011  . Diabetes mellitus   . Back pain   . Palpitations   . Anemia   . Hx MRSA infection   . Osteoarthritis     Past Surgical History:  Procedure Laterality Date  . ABDOMINAL HYSTERECTOMY  03/2001   Abdominal supracervical hysterectomy, left salpingo-oophorectomy  . ANTERIOR CERVICAL DECOMP/DISCECTOMY FUSION N/A 01/01/2014   Procedure: CERVICAL THREE-FOUR,CERVICAL FIVE-SIX,CERVICAL SIX-SEVEN ANTERIOR CERVICAL DECOMPRESSION/DISCECTOMY/FUSION;  Surgeon: Kristeen Miss, MD;  Location: [facility];  Service: Neurosurgery;  Laterality: N/A;  left-side approach  . BACK SURGERY    .  CATARACT EXTRACTION W/ INTRAOCULAR LENS  IMPLANT, BILATERAL Bilateral 2012  . CESAREAN SECTION    . DILATION AND CURETTAGE OF UTERUS  X 2  . HAND SURGERY Left 1970s   laceration repair 4th and 5th digits  . JOINT REPLACEMENT    . KNEE ARTHROSCOPY Left    torn mensicus  . NASAL SINUS SURGERY    . TOTAL KNEE ARTHROPLASTY Left 02/12/2015   Procedure: TOTAL KNEE ARTHROPLASTY;  Surgeon: [name], MD;  Location: [facility];  Service: Orthopedics;  Laterality: Left;     OB History   None      Home Medications    Prior to  Admission medications   Medication Sig Start Date End Date Taking? Authorizing Provider  acetaminophen (TYLENOL) 650 MG CR tablet Take 1,300 mg by mouth every 8 (eight) hours as needed for pain (pain).    [provider]  Ascorbic Acid (VITAMIN C) 1000 MG tablet Take 1,000 mg by mouth at bedtime.    [provider]  aspirin 81 MG tablet Take 81 mg by mouth daily.    [provider]  Azelastine HCl 0.15 % SOLN Place 1 spray into both nostrils 2 (two) times daily.    [provider]  B-D INS SYR HALF-UNIT .3CC/31G 31G X 5/16" 0.3 ML MISC 1 each by Other route See admin instructions. Use as directed 2 times daily with insulin 05/11/13   [provider]  carvedilol (COREG) 25 MG tablet Take 25 mg by mouth 2 (two) times daily. 02/24/17   [provider]  Cholecalciferol (VITAMIN D-3) 5000 UNITS TABS Take 5,000 Units by mouth daily.    [provider]  ezetimibe-simvastatin (VYTORIN) 10-40 MG per tablet Take 1 tablet by mouth daily.    [provider]  ferrous gluconate (FERGON) 324 MG tablet Take 324 mg by mouth daily with breakfast.    [provider]  fexofenadine (ALLEGRA) 180 MG tablet Take 180 mg by mouth daily.    [provider]  guaiFENesin (MUCINEX) 600 MG 12 hr tablet Take 600 mg by mouth daily.    [provider]  HUMULIN R U-500 KWIKPEN 500 UNIT/ML kwikpen Inject 5U into skin three times daily with meals 04/02/15   [provider]  Multiple Vitamins-Minerals (ALIVE WOMENS ENERGY PO) Take 1 tablet by mouth daily.    [provider]  nitroGLYCERIN (NITROSTAT) 0.4 MG SL tablet Place 1 tablet under the tongue as directed. 11/14/17   [provider]  ONE TOUCH ULTRA TEST test strip 1 each by Other route See admin instructions. Check blood sugar 3 times daily 05/11/13   [provider]  OVER THE COUNTER MEDICATION Take 1 tablet by mouth 2 (two) times daily. isoflabones  40 mg for hot flashes    [provider]  OVER THE COUNTER MEDICATION Take 1 tablet by mouth 2 (two) times daily. [place] -- Triple Joint Relief    [provider]  potassium chloride (KLOR-CON 10) 10 MEQ tablet Take 2 tablets (20 mEq total) by mouth 2 (two) times daily. 06/10/17   Nahser, Wonda Cheng, MD  ranitidine (ZANTAC) 150 MG tablet Take 1 tablet (150 mg total) by mouth 2 (two) times daily. 04/05/17   Armbruster, Carlota Raspberry, MD  sacubitril-valsartan (ENTRESTO) 49-51 MG Take 1 tablet by mouth 2 (two) times daily. 11/25/17   Pixie Casino, MD  torsemide (DEMADEX) 20 MG tablet TAKE 1 TABLET BY MOUTH TWICE A DAY 09/27/17  Hilty, Nadean Corwin, MD  TRULICITY 1.5 TS/1.7BL SOPN Inject 1.5 mg as directed once a week. 05/17/16   [provider]  vitamin E 400 UNIT capsule Take 400 Units by mouth daily.    [provider]    Family History Family History  Problem Relation Age of Onset  . Heart disease Mother   . Heart failure Father   . Colon cancer Neg Hx   . Stomach cancer Neg Hx   . Esophageal cancer Neg Hx     Social History Social History   Tobacco Use  . Smoking status: Never Smoker  . Smokeless tobacco: Never Used  Substance Use Topics  . Alcohol use: Never    Frequency: Never  . Drug use: Never     Allergies   Aspirin; Augmentin [amoxicillin-pot clavulanate]; Erythromycin; Gabapentin; Nickel; and Other   Review of Systems Review of Systems  Respiratory: Positive for shortness of breath.   All other systems reviewed and are negative.    Physical Exam Updated Vital Signs BP (!) 163/110 (BP Location: Right Arm)   Pulse 100   Temp 97.6 F (36.4 C) (Oral)   Resp (!) 22   Ht 5' (1.524 m)   SpO2 100%   BMI 32.22 kg/m   Physical Exam  Constitutional:  tachypneic   HENT:  Head: Normocephalic.  Mouth/Throat: Oropharynx is clear and moist.  Eyes: Pupils are equal, round, and reactive to light. EOM are normal.  Neck: Normal  range of motion. Neck supple.  Cardiovascular: Normal rate.  Pulmonary/Chest:  Tachypneic, crackles bilateral bases   Abdominal: Soft. Bowel sounds are normal.  Musculoskeletal:       Right lower leg: She exhibits edema.       Left lower leg: She exhibits edema.  2+ edema bilaterally   Neurological: She is alert.  Skin: Skin is warm. Capillary refill takes less than 2 seconds.  Psychiatric: She has a normal mood and affect.  Nursing note and vitals reviewed.    ED Treatments / Results  Labs (all labs ordered are listed, but only abnormal results are displayed) Labs Reviewed  BASIC METABOLIC PANEL - Abnormal; Notable for the following components:      Result Value   CO2 20 (*)    Glucose, Bld 652 (*)    BUN 39 (*)    Creatinine, Ser 2.60 (*)    GFR calc non Af Amer 18 (*)    GFR calc Af Amer 21 (*)    All other components within normal limits  CBC - Abnormal; Notable for the following components:   MCV 76.0 (*)    MCH 25.5 (*)    RDW 21.8 (*)    All other components within normal limits  BRAIN NATRIURETIC PEPTIDE - Abnormal; Notable for the following components:   B Natriuretic Peptide >4,500.0 (*)    All other components within normal limits  GLUCOSE, CAPILLARY - Abnormal; Notable for the following components:   Glucose-Capillary >600 (*)    All other components within normal limits  TROPONIN I - Abnormal; Notable for the following components:   Troponin I 0.03 (*)    All other components within normal limits  CBC - Abnormal; Notable for the following components:   RBC 5.16 (*)    MCV 76.7 (*)    RDW 21.3 (*)    All other components within normal limits  CREATININE, SERUM - Abnormal; Notable for the following components:   Creatinine, Ser 2.58 (*)    GFR  calc non Af Amer 18 (*)    GFR calc Af Amer 21 (*)    All other components within normal limits  HEMOGLOBIN A1C - Abnormal; Notable for the following components:   Hgb A1c MFr Bld 12.3 (*)    All other  components within normal limits  MRSA PCR SCREENING  MAGNESIUM  TSH  BASIC METABOLIC PANEL  HIV ANTIBODY (ROUTINE TESTING)  CBC  TROPONIN I  TROPONIN I  GLUCOSE, CAPILLARY  I-STAT TROPONIN, ED    EKG EKG Interpretation  Date/Time:  Thursday February 02 2018 14:32:39 EDT Ventricular Rate:  97 PR Interval:  190 QRS Duration: 80 QT Interval:  390 QTC Calculation: 495 R Axis:   179 Text Interpretation:  Normal sinus rhythm Low voltage QRS Anterolateral infarct , age undetermined Abnormal ECG No previous ECGs available Confirmed by Wandra Arthurs ([id number]) on 02/02/2018 7:04:24 PM   Radiology Dg Chest 2 View  Result Date: 02/02/2018 CLINICAL DATA:  67 year old female with a history of shortness of breath and lower extremity swelling for 1 week EXAM: CHEST - 2 VIEW COMPARISON:  12/17/2013 FINDINGS: Cardiomediastinal silhouette is increased as compared to the prior plain film. No pneumothorax. No pleural effusion. Lateral view demonstrates questionable new patchy opacities at the lung base. Low lung volumes. IMPRESSION: New cardiomegaly compared to the prior plain film of 3 years ago. Patchy airspace opacities at the lung bases, potentially atelectasis and/or consolidation. Correlation with lab values and patient presentation may be useful. Electronically Signed   By: Corrie Mckusick D.O.   On: 02/02/2018 16:07   Dg Pelvis Portable  Result Date: 02/02/2018 CLINICAL DATA:  Acute onset of pelvic pain. EXAM: PORTABLE PELVIS 1-2 VIEWS COMPARISON:  None. FINDINGS: There is no evidence of fracture or dislocation. Both femoral heads are seated normally within their respective acetabula. Degenerative change is noted at the lower lumbar spine. The sacroiliac joints are unremarkable in appearance. The visualized bowel gas pattern is grossly unremarkable in appearance. IMPRESSION: No evidence of fracture or dislocation. Electronically Signed   By: Garald Balding M.D.   On: 02/02/2018 23:00     Procedures Procedures (including critical care time)  Medications Ordered in ED Medications  carvedilol (COREG) tablet 25 mg (25 mg Oral Given 02/02/18 2325)  ezetimibe-simvastatin (VYTORIN) 10-40 MG per tablet 1 tablet (1 tablet Oral Given 02/02/18 2325)  nitroGLYCERIN (NITROSTAT) SL tablet 0.4 mg (has no administration in time range)  potassium chloride (K-DUR) CR tablet 20 mEq (20 mEq Oral Given 02/02/18 2325)  acetaminophen (TYLENOL) tablet 650 mg (has no administration in time range)    Or  acetaminophen (TYLENOL) suppository 650 mg (has no administration in time range)  ondansetron (ZOFRAN) tablet 4 mg (has no administration in time range)    Or  ondansetron (ZOFRAN) injection 4 mg (has no administration in time range)  heparin injection 5,000 Units (5,000 Units Subcutaneous Given 02/02/18 2325)  furosemide (LASIX) injection 80 mg (has no administration in time range)  dextrose 5 %-0.45 % sodium chloride infusion ( Intravenous Stopped 02/02/18 2331)  insulin regular bolus via infusion 0-10 Units (has no administration in time range)  insulin regular (NOVOLIN R,HUMULIN R) 100 Units in sodium chloride 0.9 % 100 mL (1 Units/mL) infusion (has no administration in time range)  dextrose 50 % solution 25 mL (has no administration in time range)  0.9 %  sodium chloride infusion ( Intravenous New Bag/Given 02/02/18 2337)  furosemide (LASIX) injection 40 mg (40 mg Intravenous Given 02/02/18 1919)  insulin  aspart (novoLOG) injection 10 Units (10 Units Subcutaneous Given 02/02/18 1920)  furosemide (LASIX) injection 40 mg (40 mg Intravenous Given 02/02/18 2159)     Initial Impression / Assessment and Plan / ED Course  I have reviewed the triage vital signs and the nursing notes.  Pertinent labs & imaging results that were available during my care of the patient were reviewed by me and considered in my medical decision making (see chart for details).     Belinda [name] is a 67 y.o. female here with  SOB, leg swelling. Patient appears volume overloaded on exam. Likely CHF exacerbation. Also hyperglycemic after being taking off trulicity but has no fever or abdominal pain to suggest DKA. Will get labs, BNP, CXR. Will likely need admission for diuresis, hyperglycemia.   8 pm Glucose 652. AG 15. Has mild AKI with Cr 2.6. BNP > 4500. Given lasix 40 mg IV and 10 U Sub Q insulin. Hospitalist to admit for CHF exacerbation, hyperglycemia.   Final Clinical Impressions(s) / ED Diagnoses   Final diagnoses:  Acute on chronic systolic congestive heart failure [facility])  Hyperglycemia    ED Discharge Orders    None       Drenda Freeze, MD 02/03/18 [phone]

## 2018-02-03 LAB — GLUCOSE, CAPILLARY
Glucose-Capillary: 100 mg/dL — ABNORMAL HIGH (ref 70–99)
Glucose-Capillary: 106 mg/dL — ABNORMAL HIGH (ref 70–99)
Glucose-Capillary: 127 mg/dL — ABNORMAL HIGH (ref 70–99)
Glucose-Capillary: 169 mg/dL — ABNORMAL HIGH (ref 70–99)
Glucose-Capillary: 177 mg/dL — ABNORMAL HIGH (ref 70–99)
Glucose-Capillary: 232 mg/dL — ABNORMAL HIGH (ref 70–99)
Glucose-Capillary: 280 mg/dL — ABNORMAL HIGH (ref 70–99)
Glucose-Capillary: 346 mg/dL — ABNORMAL HIGH (ref 70–99)
Glucose-Capillary: 423 mg/dL — ABNORMAL HIGH (ref 70–99)
Glucose-Capillary: 475 mg/dL — ABNORMAL HIGH (ref 70–99)
Glucose-Capillary: 506 mg/dL (ref 70–99)
Glucose-Capillary: 550 mg/dL (ref 70–99)
Glucose-Capillary: 70 mg/dL (ref 70–99)
Glucose-Capillary: 70 mg/dL (ref 70–99)
Glucose-Capillary: 75 mg/dL (ref 70–99)
Glucose-Capillary: 92 mg/dL (ref 70–99)
Glucose-Capillary: 94 mg/dL (ref 70–99)
Glucose-Capillary: >600 mg/dL (ref 70–99)

## 2018-02-03 LAB — CBC
HCT: 36.2 % (ref 36.0–46.0)
Hemoglobin: 12 g/dL (ref 12.0–15.0)
MCH: 25.5 pg — ABNORMAL LOW (ref 26.0–34.0)
MCHC: 33.1 g/dL (ref 30.0–36.0)
MCV: 77 fL — ABNORMAL LOW (ref 78.0–100.0)
Platelets: 162 10*3/uL (ref 150–400)
RBC: 4.7 MIL/uL (ref 3.87–5.11)
RDW: 21.5 % — ABNORMAL HIGH (ref 11.5–15.5)
WBC: 7.2 10*3/uL (ref 4.0–10.5)

## 2018-02-03 LAB — BASIC METABOLIC PANEL
Anion gap: 14 (ref 5–15)
BUN: 40 mg/dL — ABNORMAL HIGH (ref 8–23)
CO2: 20 mmol/L — ABNORMAL LOW (ref 22–32)
Calcium: 8.6 mg/dL — ABNORMAL LOW (ref 8.9–10.3)
Chloride: 104 mmol/L (ref 98–111)
Creatinine, Ser: 2.47 mg/dL — ABNORMAL HIGH (ref 0.44–1.00)
GFR calc Af Amer: 22 mL/min — ABNORMAL LOW (ref >60)
GFR calc non Af Amer: 19 mL/min — ABNORMAL LOW (ref >60)
Glucose, Bld: 492 mg/dL — ABNORMAL HIGH (ref 70–99)
Potassium: 3.3 mmol/L — ABNORMAL LOW (ref 3.5–5.1)
Sodium: 138 mmol/L (ref 135–145)

## 2018-02-03 LAB — PROCALCITONIN: Procalcitonin: 0.42 ng/mL

## 2018-02-03 LAB — MRSA PCR SCREENING: MRSA by PCR: POSITIVE — AB

## 2018-02-03 LAB — HIV ANTIBODY (ROUTINE TESTING W REFLEX): HIV Screen 4th Generation wRfx: NONREACTIVE

## 2018-02-03 LAB — TROPONIN I
Troponin I: <0.03 ng/mL (ref <0.03)
Troponin I: <0.03 ng/mL (ref <0.03)

## 2018-02-03 MED ORDER — INSULIN ASPART 100 UNIT/ML ~~LOC~~ SOLN
0.0000 [IU] | SUBCUTANEOUS | Status: DC
  Administered 2018-02-03 – 2018-02-04 (×2): 4 [IU] via SUBCUTANEOUS
  Administered 2018-02-04: 8 [IU] via SUBCUTANEOUS
  Administered 2018-02-04 – 2018-02-05 (×2): 12 [IU] via SUBCUTANEOUS
  Administered 2018-02-05: 8 [IU] via SUBCUTANEOUS
  Administered 2018-02-05: 16 [IU] via SUBCUTANEOUS
  Administered 2018-02-06: 12 [IU] via SUBCUTANEOUS

## 2018-02-03 MED ORDER — MUPIROCIN 2 % EX OINT
1.0000 "application " | TOPICAL_OINTMENT | Freq: Two times a day (BID) | CUTANEOUS | Status: AC
  Administered 2018-02-03 – 2018-02-07 (×10): 1 via NASAL
  Filled 2018-02-03 (×3): qty 22

## 2018-02-03 MED ORDER — INSULIN GLARGINE 100 UNIT/ML ~~LOC~~ SOLN
20.0000 [IU] | Freq: Every day | SUBCUTANEOUS | Status: DC
  Administered 2018-02-03: 20 [IU] via SUBCUTANEOUS
  Filled 2018-02-03 (×2): qty 0.2

## 2018-02-03 MED ORDER — CHLORHEXIDINE GLUCONATE CLOTH 2 % EX PADS
6.0000 | MEDICATED_PAD | Freq: Every day | CUTANEOUS | Status: AC
  Administered 2018-02-03 – 2018-02-07 (×5): 6 via TOPICAL

## 2018-02-03 NOTE — Progress Notes (Addendum)
Inpatient Diabetes Program Recommendations  AACE/ADA: New Consensus Statement on Inpatient Glycemic Control (2019)  Target Ranges:  Prepandial:   less than 140 mg/dL      Peak postprandial:   less than 180 mg/dL (1-2 hours)      Critically ill patients:  140 - 180 mg/dL   Results for FLETCHER, RATHBUN (MRN [id number]) as of 02/03/2018 09:18  Ref. Range 02/03/2018 04:29 02/03/2018 05:34 02/03/2018 06:41 02/03/2018 07:47 02/03/2018 08:51  Glucose-Capillary Latest Ref Range: 70 - 99 mg/dL 423 (H) 346 (H) 280 (H) 232 (H) 177 (H)  Results for MENDY, CHOU (MRN [id number]) as of 02/03/2018 09:18  Ref. Range 02/03/2018 00:04 02/03/2018 01:16 02/03/2018 02:20 02/03/2018 03:24  Glucose-Capillary Latest Ref Range: 70 - 99 mg/dL >600 (HH) 550 (HH) 506 (HH) 475 (H)  Results for NALIYA, GISH (MRN [id number]) as of 02/03/2018 09:18  Ref. Range 01/31/2015 15:09 02/02/2018 22:08  Hemoglobin A1C Latest Ref Range: 4.8 - 5.6 % 11.1 (H) 12.3 (H)   Review of Glycemic Control  Diabetes history: DM2 Outpatient Diabetes medications: U500 15 units TID with meals , Trulicity 1.5 mg Qweek (has not taken Trulicity in 1 month) Current orders for Inpatient glycemic control: Lantus 20 units daily, Novolog 0-24 units Q4H  Inpatient Diabetes Program Recommendations:  A1C: A1C 12.3% on 02/02/18 indicating an average glucose of 306 mg/dl over the past 2-3 months.  NOTE: Patient admitted with initial glucose 652 mg/dl and was ordered IV insulin drip. Noted patient now has transition orders for SQ insulin.   Addendum 02/03/18@10 :27-Spoke with patient about diabetes and home regimen for diabetes control. Patient reports that she is followed by Dr. Forde Dandy for diabetes management and currently she takes Humulin R U500 15 units with meals as an outpatient for diabetes control.  Patient is using an Humulin R U500 insulin pen and she dials up to 15 units TID with meals. Inquired if she was taking the exact dose of Humulin R U500 that is prescribed by Dr.  Forde Dandy.  Patient states that she dials up to 15 units on the U500 insulin pen but the dose is 5 times that (so she thought she was taking 75 units TID). Explained that with the Humulin R U500 insulin pen, the dose is exactly what the doctor wants you to take; no multiplication or division is necessary (since she is not using Humulin R U500 vial with U100 syringe). Patient does not appear to understand that with the Humulin R U500 insulin pen, the dose she dials up to is the exact dose of Humulin R U500 she is getting. Patient states that she was taking Trulicity once a week but she was told to stop it about 1 month ago due to nausea and vomiting. Patient feels that the nausea and vomiting was from another medication she was taking and she would like to restart Trulicity in the future. Patient reports that she checks her glucose 3-4 times per day and it is has been running in the upper 200's to low 300's mg/dl since stopping the Trulicity.  Discussed A1C results (12.3% on 02/02/18) and explained that her current A1C indicates an average glucose of 306 mg/dl over the past 2-3 months. Discussed glucose and A1C goals. Discussed importance of checking CBGs and maintaining good CBG control to prevent long-term and short-term complications. Stressed to the patient the importance of improving glycemic control to prevent further complications from uncontrolled diabetes. Encouraged patient to keep a good record of glucose trends and exact Humulin  R U500 insulin dose she is taking so Dr. Forde Dandy will be aware and can use the information to help make adjustments to improve DM control.  Patient verbalized understanding of information discussed and she states that she has no further questions at this time related to diabetes. Called Dr. Baldwin Crown office to inquire about prescribed dose of Humulin R U500 and spoke with [place] (Nurse) and was informed that patient should be taking Humulin R U500 20 units TID with meals. Called patient  over phone to inform her that Dr. Forde Dandy has prescribed her to take Humulin R U500 20 units TID with meals. Patient verbalized understanding that she should be taking Humulin R U500 20 units TID with meals.  Thanks, Barnie Alderman, RN, MSN, CDE Diabetes Coordinator Inpatient Diabetes Program [phone] (Team Pager from 8am to 5pm)

## 2018-02-03 NOTE — Progress Notes (Signed)
[facility]  PROGRESS NOTE  Belinda [name] VPX:[id number] DOB: July 06, 1950 DOA: 02/02/2018 PCP: Reynold Bowen, MD   Brief HPI:    67 y.o. female with history of systolic heart failure, diabetes mellitus type 2, chronic kidney disease stage III-IV presents to the ER with complaints of increasing shortness of breath over the last few days.  Patient states she has been getting progressively short of breath on exertion and at rest and lying down.  Denies any chest pain.  Last few days she has not taken her Demadex due to difficulty walking from shortness of breath.  Patient states that her blood sugars has been running high since her Trulicity was discontinued 2 months ago.     Subjective   Patient seen and examined, denies shortness of breath.  She was started on glucose stabilizer with IV insulin.   Assessment/Plan:     1. Acute on chronic systolic CHF-patient's last echo from November 2018 showed EF 25 to 30% with grade 2 diastolic dysfunction.  Patient has been started on IV Lasix 80 mg every 12 hours.  [name] is currently on hold due to CKD stage IV.  Follow BMP in am.  Strict intake and output. 2. Uncontrolled diabetes mellitus type 2-with hyperosmolar status.  Patient was started on IV insulin in the ED.  Currently IV insulin has been tapered off.  Patient started on Lantus 10 units subcu daily along with sliding scale insulin with NovoLog.  Monitor CBG every 6 hours. 3. CKD stage IV-Entresto is on hold.  Follow strict intake and output.  Check BMP in a.m. 4. Hypertension-blood pressure stable, continue Coreg as needed hydralazine. 5. Hypokalemia-mild, potassium is 3.3.  Will replace potassium and check BMP in a.m.    DVT prophylaxis: Heparin  Code Status: Full code  Family Communication: No family at bedside  Disposition Plan: likely home when medically ready for discharge   Consultants:  None  Procedures:     Antibiotics:   Anti-infectives (From  admission, onward)   None       Objective   Vitals:   02/03/18 0800 02/03/18 0900 02/03/18 0958 02/03/18 1213  BP:   137/80 108/85  Pulse: 69 71 71   Resp: 13 14 13    Temp:   (!) 97.4 F (36.3 C) (!) 97.5 F (36.4 C)  TempSrc:    Oral  SpO2: 100% 100% 100% 100%  Weight:      Height:        Intake/Output Summary (Last 24 hours) at 02/03/2018 1536 Last data filed at 02/03/2018 0600 Gross per 24 hour  Intake 142.44 ml  Output 700 ml  Net -557.56 ml   Filed Weights   02/03/18 0006  Weight: 75 kg     Physical Examination:    General: Appears in no acute distress, lethargic  Cardiovascular: S1-S2, regular, no murmurs  Respiratory: Clear to auscultation bilaterally, no wheezing or crackles  Abdomen: Soft, nontender, no organomegaly  Extremities: Bilateral 1+ pitting edema of lower extremities  Neurologic: Lethargic but arousable     Data Reviewed: I have personally reviewed following labs and imaging studies  CBG: Recent Labs  Lab 02/03/18 1103 02/03/18 1211 02/03/18 1305 02/03/18 1416 02/03/18 1500  GLUCAP 100* 92 94 70 75    CBC: Recent Labs  Lab 02/02/18 1501 02/02/18 2208 02/03/18 0352  WBC 8.8 7.9 7.2  HGB 12.8 13.4 12.0  HCT 38.1 39.6 36.2  MCV 76.0* 76.7* 77.0*  PLT 192 189 162    Basic  Metabolic Panel: Recent Labs  Lab 02/02/18 1501 02/02/18 2208 02/03/18 0352  NA 136  --  138  K 4.1  --  3.3*  CL 101  --  104  CO2 20*  --  20*  GLUCOSE 652*  --  492*  BUN 39*  --  40*  CREATININE 2.60* 2.58* 2.47*  CALCIUM 9.0  --  8.6*  MG  --  1.7  --     Recent Results (from the past 240 hour(s))  MRSA PCR Screening     Status: Abnormal   Collection Time: 02/03/18 12:21 AM  Result Value Ref Range Status   MRSA by PCR POSITIVE (A) NEGATIVE Final    Comment:        The GeneXpert MRSA Assay (FDA approved for NASAL specimens only), is one component of a comprehensive MRSA colonization surveillance program. It is not intended to  diagnose MRSA infection nor to guide or monitor treatment for MRSA infections. RESULT CALLED TO, READ BACK BY AND VERIFIED WITHDoristine Johns RN [id number] 02/03/18 A BROWNING Performed at [facility], [place] [address], [place], [place] [address]      Liver Function Tests: No results for input(s): AST, ALT, ALKPHOS, BILITOT, PROT, ALBUMIN in the last 168 hours. No results for input(s): LIPASE, AMYLASE in the last 168 hours. No results for input(s): AMMONIA in the last 168 hours.  Cardiac Enzymes: Recent Labs  Lab 02/02/18 2208 02/03/18 0352 02/03/18 0926  TROPONINI 0.03* <0.03 <0.03   BNP (last 3 results) Recent Labs    02/02/18 1501  BNP >4,500.0*    ProBNP (last 3 results) Recent Labs    04/26/17 1246  PROBNP 4,620*      Studies: Dg Chest 2 View  Result Date: 02/02/2018 CLINICAL DATA:  67 year old female with a history of shortness of breath and lower extremity swelling for 1 week EXAM: CHEST - 2 VIEW COMPARISON:  12/17/2013 FINDINGS: Cardiomediastinal silhouette is increased as compared to the prior plain film. No pneumothorax. No pleural effusion. Lateral view demonstrates questionable new patchy opacities at the lung base. Low lung volumes. IMPRESSION: New cardiomegaly compared to the prior plain film of 3 years ago. Patchy airspace opacities at the lung bases, potentially atelectasis and/or consolidation. Correlation with lab values and patient presentation may be useful. Electronically Signed   By: Corrie Mckusick D.O.   On: 02/02/2018 16:07   Dg Pelvis Portable  Result Date: 02/02/2018 CLINICAL DATA:  Acute onset of pelvic pain. EXAM: PORTABLE PELVIS 1-2 VIEWS COMPARISON:  None. FINDINGS: There is no evidence of fracture or dislocation. Both femoral heads are seated normally within their respective acetabula. Degenerative change is noted at the lower lumbar spine. The sacroiliac joints are unremarkable in appearance. The visualized bowel gas pattern is grossly unremarkable  in appearance. IMPRESSION: No evidence of fracture or dislocation. Electronically Signed   By: Garald Balding M.D.   On: 02/02/2018 23:00    Scheduled Meds: . carvedilol  25 mg Oral BID  . Chlorhexidine Gluconate Cloth  6 each Topical Q0600  . ezetimibe-simvastatin  1 tablet Oral Daily  . furosemide  80 mg Intravenous Q12H  . heparin  5,000 Units Subcutaneous Q8H  . insulin aspart  0-24 Units Subcutaneous Q4H  . insulin glargine  20 Units Subcutaneous Daily  . mupirocin ointment  1 application Nasal BID  . nitroGLYCERIN  0.4 mg Sublingual UD  . potassium chloride  20 mEq Oral BID      Time spent: 20  min  [facility] Pager [phone]. If 7PM-7AM, please contact night-coverage at [email or web address], Office  [phone]  password [email or web address]  02/03/2018, 3:36 PM  LOS: 1 day

## 2018-02-04 DIAGNOSIS — L8990 Pressure ulcer of unspecified site, unspecified stage: Secondary | ICD-10-CM

## 2018-02-04 DIAGNOSIS — E160 Drug-induced hypoglycemia without coma: Secondary | ICD-10-CM

## 2018-02-04 DIAGNOSIS — E861 Hypovolemia: Secondary | ICD-10-CM

## 2018-02-04 DIAGNOSIS — I9589 Other hypotension: Secondary | ICD-10-CM

## 2018-02-04 DIAGNOSIS — T383X5A Adverse effect of insulin and oral hypoglycemic [antidiabetic] drugs, initial encounter: Secondary | ICD-10-CM

## 2018-02-04 LAB — GLUCOSE, CAPILLARY
Glucose-Capillary: 106 mg/dL — ABNORMAL HIGH (ref 70–99)
Glucose-Capillary: 115 mg/dL — ABNORMAL HIGH (ref 70–99)
Glucose-Capillary: 178 mg/dL — ABNORMAL HIGH (ref 70–99)
Glucose-Capillary: 244 mg/dL — ABNORMAL HIGH (ref 70–99)
Glucose-Capillary: 282 mg/dL — ABNORMAL HIGH (ref 70–99)
Glucose-Capillary: 47 mg/dL — ABNORMAL LOW (ref 70–99)
Glucose-Capillary: 54 mg/dL — ABNORMAL LOW (ref 70–99)
Glucose-Capillary: 99 mg/dL (ref 70–99)

## 2018-02-04 LAB — BASIC METABOLIC PANEL
Anion gap: 9 (ref 5–15)
BUN: 43 mg/dL — ABNORMAL HIGH (ref 8–23)
CO2: 20 mmol/L — ABNORMAL LOW (ref 22–32)
Calcium: 7.8 mg/dL — ABNORMAL LOW (ref 8.9–10.3)
Chloride: 107 mmol/L (ref 98–111)
Creatinine, Ser: 2.37 mg/dL — ABNORMAL HIGH (ref 0.44–1.00)
GFR calc Af Amer: 23 mL/min — ABNORMAL LOW (ref >60)
GFR calc non Af Amer: 20 mL/min — ABNORMAL LOW (ref >60)
Glucose, Bld: 109 mg/dL — ABNORMAL HIGH (ref 70–99)
Potassium: 4.6 mmol/L (ref 3.5–5.1)
Sodium: 136 mmol/L (ref 135–145)

## 2018-02-04 MED ORDER — INSULIN GLARGINE 100 UNIT/ML ~~LOC~~ SOLN
10.0000 [IU] | Freq: Every day | SUBCUTANEOUS | Status: DC
  Administered 2018-02-04 – 2018-02-05 (×2): 10 [IU] via SUBCUTANEOUS
  Filled 2018-02-04 (×2): qty 0.1

## 2018-02-04 MED ORDER — SODIUM CHLORIDE 0.9 % IV BOLUS
250.0000 mL | Freq: Once | INTRAVENOUS | Status: AC
  Administered 2018-02-04: 250 mL via INTRAVENOUS

## 2018-02-04 MED ORDER — CLOTRIMAZOLE 2 % VA CREA: 1.0000 | TOPICAL_CREAM | Freq: Every day | VAGINAL | Status: DC

## 2018-02-04 MED ORDER — SODIUM CHLORIDE 0.9 % IV BOLUS
250.0000 mL | Freq: Once | INTRAVENOUS | Status: AC
  Administered 2018-02-04: 07:00:00 via INTRAVENOUS

## 2018-02-04 MED ORDER — CLOTRIMAZOLE 2 % VA CREA
1.0000 | TOPICAL_CREAM | Freq: Every day | VAGINAL | Status: AC
  Administered 2018-02-04 – 2018-02-05 (×3): 1 via VAGINAL
  Filled 2018-02-04: qty 21

## 2018-02-04 NOTE — Progress Notes (Signed)
Discharge noted when changing patient's purewick. Text paged hospitalist and received order for gyne-lotrimin. Will continue to monitor. Lajoyce Corners, RN

## 2018-02-04 NOTE — Progress Notes (Signed)
Patient's BP trending down with latest being 86/56 (77). Patient lethargic. Provider notified and given an order for 250cc NS bolus and then a manual blood pressure. Will continue to monitor. Lajoyce Corners, RN

## 2018-02-04 NOTE — Progress Notes (Signed)
After 250 cc NS bolus, manual blood pressure is 80/58. Provider contacted. Given verbal order for another 250cc NS bolus and to hold her 6 am dose of Lasix 80mg . Will continue to monitor. Lajoyce Corners, RN

## 2018-02-04 NOTE — Progress Notes (Signed)
[facility]  PROGRESS NOTE  Belinda [name] ALP:[id number] DOB: 04-30-1951 DOA: 02/02/2018 PCP: Reynold Bowen, MD   Brief HPI:    67 y.o. female with history of systolic heart failure, diabetes mellitus type 2, chronic kidney disease stage III-IV presents to the ER with complaints of increasing shortness of breath over the last few days.  Patient states she has been getting progressively short of breath on exertion and at rest and lying down.  Denies any chest pain.  Last few days she has not taken her Demadex due to difficulty walking from shortness of breath.  Patient states that her blood sugars has been running high since her Trulicity was discontinued 2 months ago.     Subjective   Patient seen and examined, became hypotensive this morning.  Required 500 cc normal saline bolus.  Also became hypoglycemic this morning.  With blood glucose in 50s, required 1 amp of D50.   Assessment/Plan:     1. Acute on chronic systolic CHF-patient's last echo from November 2018 showed EF 25 to 30% with grade 2 diastolic dysfunction.  Patient was started on IV Lasix 80 mg every 12 hours, which is currently on hold due to hypotension which developed this morning.  [name] is currently on hold due to CKD stage IV.  Follow BMP in am.  Strict intake and output.  At home patient takes Demadex 20 mg twice a day.  Will restart Demadex from tomorrow morning. 2. Hypotension-likely from overdiuresis.  Will discontinue IV Lasix.  Pressure improved after patient received 500 cc normal saline bolus. 3. Uncontrolled diabetes mellitus type 2-with hyperosmolar status.  Patient was started on IV insulin in the ED.  Currently IV insulin has been tapered off.  Patient was started on Lantus 20 units subcu daily yesterday.  This morning patient became hypoglycemic.  Will cut down the dose of Lantus to 10 units subcu daily , monitor CBG every 6 hours.  Sliding scale insulin with NovoLog. 4. CKD stage IV-Entresto is  on hold.  Follow strict intake and output.  Creatinine is 2.37 5. Hypertension-blood pressure stable, continue Coreg as needed hydralazine. 6. Hypokalemia-replete    DVT prophylaxis: Heparin  Code Status: Full code  Family Communication: No family at bedside  Disposition Plan: likely home when medically ready for discharge   Consultants:  None  Procedures:     Antibiotics:   Anti-infectives (From admission, onward)   None       Objective   Vitals:   02/04/18 0349 02/04/18 0410 02/04/18 0628 02/04/18 1119  BP: (!) 75/59 (!) 86/56 (!) 80/58 105/74  Pulse: 70 70 70 77  Resp: 16 17 16 18   Temp:  (!) 97.3 F (36.3 C)  97.8 F (36.6 C)  TempSrc:  Axillary  Oral  SpO2: 100% 100% 100% 100%  Weight:      Height:        Intake/Output Summary (Last 24 hours) at 02/04/2018 1239 Last data filed at 02/04/2018 0800 Gross per 24 hour  Intake 975.47 ml  Output 300 ml  Net 675.47 ml   Filed Weights   02/03/18 0006  Weight: 75 kg     Physical Examination:    Eyes: No icterus, extraocular muscles intact  Mouth: Oral mucosa is moist, no lesions on palate,  Neck: Supple, no deformities, masses, or tenderness Lungs: Normal respiratory effort, bilateral clear to auscultation, no crackles or wheezes.  Heart: Regular rate and rhythm, S1 and S2 normal, no murmurs, rubs auscultated  Abdomen: BS normoactive,soft,nondistended,non-tender to palpation,no organomegaly Extremities: 2+ pitting edema bilaterally. Neuro : Alert and oriented to time, place and person, No focal deficits      Data Reviewed: I have personally reviewed following labs and imaging studies  CBG: Recent Labs  Lab 02/04/18 0409 02/04/18 0808 02/04/18 0812 02/04/18 0858 02/04/18 1116  GLUCAP 99 47* 54* 106* 115*    CBC: Recent Labs  Lab 02/02/18 1501 02/02/18 2208 02/03/18 0352  WBC 8.8 7.9 7.2  HGB 12.8 13.4 12.0  HCT 38.1 39.6 36.2  MCV 76.0* 76.7* 77.0*  PLT 192 189 162     Basic Metabolic Panel: Recent Labs  Lab 02/02/18 1501 02/02/18 2208 02/03/18 0352 02/04/18 0320  NA 136  --  138 136  K 4.1  --  3.3* 4.6  CL 101  --  104 107  CO2 20*  --  20* 20*  GLUCOSE 652*  --  492* 109*  BUN 39*  --  40* 43*  CREATININE 2.60* 2.58* 2.47* 2.37*  CALCIUM 9.0  --  8.6* 7.8*  MG  --  1.7  --   --     Recent Results (from the past 240 hour(s))  MRSA PCR Screening     Status: Abnormal   Collection Time: 02/03/18 12:21 AM  Result Value Ref Range Status   MRSA by PCR POSITIVE (A) NEGATIVE Final    Comment:        The GeneXpert MRSA Assay (FDA approved for NASAL specimens only), is one component of a comprehensive MRSA colonization surveillance program. It is not intended to diagnose MRSA infection nor to guide or monitor treatment for MRSA infections. RESULT CALLED TO, READ BACK BY AND VERIFIED WITHDoristine Johns RN [id number] 02/03/18 A BROWNING Performed at [facility], [place] [address] [place], [place] [address]      Liver Function Tests: No results for input(s): AST, ALT, ALKPHOS, BILITOT, PROT, ALBUMIN in the last 168 hours. No results for input(s): LIPASE, AMYLASE in the last 168 hours. No results for input(s): AMMONIA in the last 168 hours.  Cardiac Enzymes: Recent Labs  Lab 02/02/18 2208 02/03/18 0352 02/03/18 0926  TROPONINI 0.03* <0.03 <0.03   BNP (last 3 results) Recent Labs    02/02/18 1501  BNP >4,500.0*    ProBNP (last 3 results) Recent Labs    04/26/17 1246  PROBNP 4,620*      Studies: Dg Chest 2 View  Result Date: 02/02/2018 CLINICAL DATA:  67 year old female with a history of shortness of breath and lower extremity swelling for 1 week EXAM: CHEST - 2 VIEW COMPARISON:  12/17/2013 FINDINGS: Cardiomediastinal silhouette is increased as compared to the prior plain film. No pneumothorax. No pleural effusion. Lateral view demonstrates questionable new patchy opacities at the lung base. Low lung volumes. IMPRESSION: New  cardiomegaly compared to the prior plain film of 3 years ago. Patchy airspace opacities at the lung bases, potentially atelectasis and/or consolidation. Correlation with lab values and patient presentation may be useful. Electronically Signed   By: Corrie Mckusick D.O.   On: 02/02/2018 16:07   Dg Pelvis Portable  Result Date: 02/02/2018 CLINICAL DATA:  Acute onset of pelvic pain. EXAM: PORTABLE PELVIS 1-2 VIEWS COMPARISON:  None. FINDINGS: There is no evidence of fracture or dislocation. Both femoral heads are seated normally within their respective acetabula. Degenerative change is noted at the lower lumbar spine. The sacroiliac joints are unremarkable in appearance. The visualized bowel gas pattern is grossly unremarkable in appearance.  IMPRESSION: No evidence of fracture or dislocation. Electronically Signed   By: Garald Balding M.D.   On: 02/02/2018 23:00    Scheduled Meds: . carvedilol  25 mg Oral BID  . Chlorhexidine Gluconate Cloth  6 each Topical Q0600  . clotrimazole  1 Applicatorful Vaginal QHS  . ezetimibe-simvastatin  1 tablet Oral Daily  . furosemide  80 mg Intravenous Q12H  . heparin  5,000 Units Subcutaneous Q8H  . insulin aspart  0-24 Units Subcutaneous Q4H  . insulin glargine  20 Units Subcutaneous Daily  . mupirocin ointment  1 application Nasal BID  . nitroGLYCERIN  0.4 mg Sublingual UD  . potassium chloride  20 mEq Oral BID      Time spent: 20 min  Oswald Hillock   [facility] Pager [phone]. If 7PM-7AM, please contact night-coverage at [email or web address], Office  [phone]  password [email or web address]  02/04/2018, 12:39 PM  LOS: 2 days

## 2018-02-05 LAB — BASIC METABOLIC PANEL
Anion gap: 9 (ref 5–15)
BUN: 53 mg/dL — ABNORMAL HIGH (ref 8–23)
CO2: 20 mmol/L — ABNORMAL LOW (ref 22–32)
Calcium: 7.6 mg/dL — ABNORMAL LOW (ref 8.9–10.3)
Chloride: 106 mmol/L (ref 98–111)
Creatinine, Ser: 2.91 mg/dL — ABNORMAL HIGH (ref 0.44–1.00)
GFR calc Af Amer: 18 mL/min — ABNORMAL LOW (ref >60)
GFR calc non Af Amer: 16 mL/min — ABNORMAL LOW (ref >60)
Glucose, Bld: 160 mg/dL — ABNORMAL HIGH (ref 70–99)
Potassium: 5.4 mmol/L — ABNORMAL HIGH (ref 3.5–5.1)
Sodium: 135 mmol/L (ref 135–145)

## 2018-02-05 LAB — GLUCOSE, CAPILLARY
Glucose-Capillary: 106 mg/dL — ABNORMAL HIGH (ref 70–99)
Glucose-Capillary: 127 mg/dL — ABNORMAL HIGH (ref 70–99)
Glucose-Capillary: 250 mg/dL — ABNORMAL HIGH (ref 70–99)
Glucose-Capillary: 253 mg/dL — ABNORMAL HIGH (ref 70–99)
Glucose-Capillary: 310 mg/dL — ABNORMAL HIGH (ref 70–99)
Glucose-Capillary: 333 mg/dL — ABNORMAL HIGH (ref 70–99)
Glucose-Capillary: 49 mg/dL — ABNORMAL LOW (ref 70–99)

## 2018-02-05 MED ORDER — FUROSEMIDE 10 MG/ML IJ SOLN
40.0000 mg | Freq: Two times a day (BID) | INTRAMUSCULAR | Status: DC
  Administered 2018-02-05: 40 mg via INTRAVENOUS
  Filled 2018-02-05: qty 4

## 2018-02-05 MED ORDER — CARVEDILOL 12.5 MG PO TABS
12.5000 mg | ORAL_TABLET | Freq: Two times a day (BID) | ORAL | Status: DC
  Administered 2018-02-05 – 2018-02-06 (×2): 12.5 mg via ORAL
  Filled 2018-02-05 (×2): qty 1

## 2018-02-05 MED ORDER — TORSEMIDE 20 MG PO TABS
20.0000 mg | ORAL_TABLET | Freq: Every day | ORAL | Status: DC
  Administered 2018-02-05: 20 mg via ORAL
  Filled 2018-02-05: qty 1

## 2018-02-05 NOTE — Progress Notes (Signed)
[facility]  PROGRESS NOTE  Belinda [name] BTD:[id number] DOB: 01/28/1951 DOA: 02/02/2018 PCP: Reynold Bowen, MD   Brief HPI:    67 y.o. female with history of systolic heart failure, diabetes mellitus type 2, chronic kidney disease stage III-IV presents to the ER with complaints of increasing shortness of breath over the last few days.  Patient states she has been getting progressively short of breath on exertion and at rest and lying down.  Denies any chest pain.  Last few days she has not taken her Demadex due to difficulty walking from shortness of breath.  Patient states that her blood sugars has been running high since her Trulicity was discontinued 2 months ago.    Subjective   Patient seen and examined, blood pressure is stable.  Complains of mild dyspnea on exertion.  Not requiring oxygen.  O2 sats 100% on room air..   Assessment/Plan:     1. Acute on chronic systolic CHF-patient's last echo from November 2018 showed EF 25 to 30% with grade 2 diastolic dysfunction.  Patient was started on IV Lasix 80 mg every 12 hours, which is currently on hold due to hypotension which developed this morning.  [name] is currently on hold due to CKD stage IV.  Follow BMP in am.  Strict intake and output.  At home patient takes Demadex 20 mg twice a day.   2. Hypotension-likely from overdiuresis.  IV Lasix was discontinued.  Blood pressure improved after she received 500 cc normal saline bolus.  As she has been restarted on Demadex, will closely monitor patient's blood pressure. 3. Uncontrolled diabetes mellitus type 2-with hyperosmolar status.  Patient was started on IV insulin in the ED.  Currently IV insulin has been tapered off.  Patient was started on Lantus 20 units subcu daily yesterday.  Patient became hypoglycemic yesterday and the dose of Lantus was cut down to 10 units subcu daily.  Will closely monitor patient's blood sugar in the hospital.   4. CKD stage IV-Entresto is on  hold.  Follow strict intake and output.  Creatinine is 2.91 5. Hypertension-blood pressure stable, continue Coreg , will cut down the dose of Coreg to 12.5 mg p.o. twice daily due to hypotension as above. 6. Hypokalemia-replete    DVT prophylaxis: Heparin  Code Status: Full code  Family Communication: No family at bedside  Disposition Plan: likely home when medically ready for discharge   Consultants:  None  Procedures:     Antibiotics:   Anti-infectives (From admission, onward)   None       Objective   Vitals:   02/04/18 2023 02/04/18 2350 02/05/18 0524 02/05/18 0847  BP: (!) 91/57 107/71 110/73   Pulse: 89 85 78   Resp: 20 (!) 26 18   Temp: 97.7 F (36.5 C) 98.2 F (36.8 C) 97.6 F (36.4 C) (!) 97.5 F (36.4 C)  TempSrc: Oral Oral Oral Axillary  SpO2: 100% 99% 100%   Weight:   34.2 kg   Height:        Intake/Output Summary (Last 24 hours) at 02/05/2018 1231 Last data filed at 02/05/2018 0800 Gross per 24 hour  Intake 347.42 ml  Output 1800 ml  Net -1452.58 ml   Filed Weights   02/03/18 0006 02/05/18 0524  Weight: 75 kg 34.2 kg     Physical Examination:  Physical Exam: Eyes: No icterus, extraocular muscles intact  Mouth: Oral mucosa is moist, no lesions on palate,  Neck: Supple, no deformities, masses,  or tenderness Lungs: Normal respiratory effort, bilateral clear to auscultation, no crackles or wheezes.  Heart: Regular rate and rhythm, S1 and S2 normal, no murmurs, rubs auscultated Abdomen: BS normoactive,soft,nondistended,non-tender to palpation,no organomegaly Extremities: Bilateral 2+ pitting edema of the lower extremities Neuro : Alert and oriented to time, place and person, No focal deficits       Data Reviewed: I have personally reviewed following labs and imaging studies  CBG: Recent Labs  Lab 02/04/18 2021 02/04/18 2348 02/05/18 0523 02/05/18 0846 02/05/18 1000  GLUCAP 282* 253* 106* 49* 127*    CBC: Recent Labs   Lab 02/02/18 1501 02/02/18 2208 02/03/18 0352  WBC 8.8 7.9 7.2  HGB 12.8 13.4 12.0  HCT 38.1 39.6 36.2  MCV 76.0* 76.7* 77.0*  PLT 192 189 599    Basic Metabolic Panel: Recent Labs  Lab 02/02/18 1501 02/02/18 2208 02/03/18 0352 02/04/18 0320 02/05/18 0320  NA 136  --  138 136 135  K 4.1  --  3.3* 4.6 5.4*  CL 101  --  104 107 106  CO2 20*  --  20* 20* 20*  GLUCOSE 652*  --  492* 109* 160*  BUN 39*  --  40* 43* 53*  CREATININE 2.60* 2.58* 2.47* 2.37* 2.91*  CALCIUM 9.0  --  8.6* 7.8* 7.6*  MG  --  1.7  --   --   --     Recent Results (from the past 240 hour(s))  MRSA PCR Screening     Status: Abnormal   Collection Time: 02/03/18 12:21 AM  Result Value Ref Range Status   MRSA by PCR POSITIVE (A) NEGATIVE Final    Comment:        The GeneXpert MRSA Assay (FDA approved for NASAL specimens only), is one component of a comprehensive MRSA colonization surveillance program. It is not intended to diagnose MRSA infection nor to guide or monitor treatment for MRSA infections. RESULT CALLED TO, READ BACK BY AND VERIFIED WITHDoristine Johns RN [id number] 02/03/18 A BROWNING Performed at [facility], [place] [address], [place], [place] [address]       Cardiac Enzymes: Recent Labs  Lab 02/02/18 2208 02/03/18 0352 02/03/18 0926  TROPONINI 0.03* <0.03 <0.03   BNP (last 3 results) Recent Labs    02/02/18 1501  BNP >4,500.0*    ProBNP (last 3 results) Recent Labs    04/26/17 1246  PROBNP 4,620*      Studies: No results found.  Scheduled Meds: . carvedilol  25 mg Oral BID  . Chlorhexidine Gluconate Cloth  6 each Topical Q0600  . clotrimazole  1 Applicatorful Vaginal QHS  . ezetimibe-simvastatin  1 tablet Oral Daily  . furosemide  40 mg Intravenous Q12H  . heparin  5,000 Units Subcutaneous Q8H  . insulin aspart  0-24 Units Subcutaneous Q4H  . insulin glargine  10 Units Subcutaneous QHS  . mupirocin ointment  1 application Nasal BID  . nitroGLYCERIN  0.4 mg  Sublingual UD      Time spent: 20 min  Oswald Hillock   [facility] Pager [phone]. If 7PM-7AM, please contact night-coverage at [email or web address], Office  [phone]  password [email or web address]  02/05/2018, 12:31 PM  LOS: 3 days

## 2018-02-05 NOTE — Progress Notes (Signed)
Awaiting return call from [facility], 2nd page placed to inform md of pt potassium level 5.4 and 568 cc of urinary retention per bladder scan.

## 2018-02-05 NOTE — Progress Notes (Signed)
Pt cbg was 49 gave juice and helped her with breakfast and paged the doctor, rechecked and the cbg was 127

## 2018-02-05 NOTE — Progress Notes (Signed)
Pt potassium level 5.4 and pt with urinary retention 568 cc noted per bladder scan. [facility] night coverage paged at [phone]

## 2018-02-06 ENCOUNTER — Inpatient Hospital Stay: Payer: Self-pay

## 2018-02-06 DIAGNOSIS — N183 Chronic kidney disease, stage 3 (moderate): Secondary | ICD-10-CM

## 2018-02-06 DIAGNOSIS — I5023 Acute on chronic systolic (congestive) heart failure: Secondary | ICD-10-CM

## 2018-02-06 DIAGNOSIS — E1121 Type 2 diabetes mellitus with diabetic nephropathy: Secondary | ICD-10-CM

## 2018-02-06 DIAGNOSIS — I10 Essential (primary) hypertension: Secondary | ICD-10-CM

## 2018-02-06 DIAGNOSIS — N170 Acute kidney failure with tubular necrosis: Secondary | ICD-10-CM

## 2018-02-06 DIAGNOSIS — R739 Hyperglycemia, unspecified: Secondary | ICD-10-CM

## 2018-02-06 LAB — BASIC METABOLIC PANEL
Anion gap: 9 (ref 5–15)
BUN: 61 mg/dL — ABNORMAL HIGH (ref 8–23)
CO2: 22 mmol/L (ref 22–32)
Calcium: 7.6 mg/dL — ABNORMAL LOW (ref 8.9–10.3)
Chloride: 104 mmol/L (ref 98–111)
Creatinine, Ser: 2.95 mg/dL — ABNORMAL HIGH (ref 0.44–1.00)
GFR calc Af Amer: 18 mL/min — ABNORMAL LOW (ref >60)
GFR calc non Af Amer: 16 mL/min — ABNORMAL LOW (ref >60)
Glucose, Bld: 202 mg/dL — ABNORMAL HIGH (ref 70–99)
Potassium: 4.6 mmol/L (ref 3.5–5.1)
Sodium: 135 mmol/L (ref 135–145)

## 2018-02-06 LAB — GLUCOSE, CAPILLARY
Glucose-Capillary: 136 mg/dL — ABNORMAL HIGH (ref 70–99)
Glucose-Capillary: 149 mg/dL — ABNORMAL HIGH (ref 70–99)
Glucose-Capillary: 205 mg/dL — ABNORMAL HIGH (ref 70–99)
Glucose-Capillary: 255 mg/dL — ABNORMAL HIGH (ref 70–99)
Glucose-Capillary: 271 mg/dL — ABNORMAL HIGH (ref 70–99)
Glucose-Capillary: 83 mg/dL (ref 70–99)

## 2018-02-06 MED ORDER — INSULIN GLARGINE 100 UNIT/ML ~~LOC~~ SOLN
20.0000 [IU] | Freq: Every day | SUBCUTANEOUS | Status: DC
  Administered 2018-02-06 – 2018-02-07 (×2): 20 [IU] via SUBCUTANEOUS
  Filled 2018-02-06 (×2): qty 0.2

## 2018-02-06 MED ORDER — GUAIFENESIN ER 600 MG PO TB12
1200.0000 mg | ORAL_TABLET | Freq: Two times a day (BID) | ORAL | Status: DC
  Administered 2018-02-06 – 2018-02-16 (×21): 1200 mg via ORAL
  Filled 2018-02-06 (×22): qty 2

## 2018-02-06 MED ORDER — FUROSEMIDE 10 MG/ML IJ SOLN
40.0000 mg | Freq: Two times a day (BID) | INTRAMUSCULAR | Status: DC
  Administered 2018-02-06: 40 mg via INTRAVENOUS
  Filled 2018-02-06: qty 4

## 2018-02-06 MED ORDER — INSULIN ASPART 100 UNIT/ML ~~LOC~~ SOLN
0.0000 [IU] | Freq: Three times a day (TID) | SUBCUTANEOUS | Status: DC
  Administered 2018-02-06: 2 [IU] via SUBCUTANEOUS
  Administered 2018-02-06: 5 [IU] via SUBCUTANEOUS
  Administered 2018-02-07 (×3): 8 [IU] via SUBCUTANEOUS
  Administered 2018-02-08 (×2): 5 [IU] via SUBCUTANEOUS
  Administered 2018-02-08: 3 [IU] via SUBCUTANEOUS
  Administered 2018-02-09 (×3): 5 [IU] via SUBCUTANEOUS
  Administered 2018-02-10: 3 [IU] via SUBCUTANEOUS
  Administered 2018-02-10: 8 [IU] via SUBCUTANEOUS
  Administered 2018-02-11: 3 [IU] via SUBCUTANEOUS
  Administered 2018-02-11 (×2): 5 [IU] via SUBCUTANEOUS
  Administered 2018-02-12: 2 [IU] via SUBCUTANEOUS
  Administered 2018-02-12: 5 [IU] via SUBCUTANEOUS
  Administered 2018-02-13: 2 [IU] via SUBCUTANEOUS
  Administered 2018-02-15 (×3): 3 [IU] via SUBCUTANEOUS
  Administered 2018-02-16: 2 [IU] via SUBCUTANEOUS

## 2018-02-06 MED ORDER — FUROSEMIDE 10 MG/ML IJ SOLN
120.0000 mg | Freq: Once | INTRAMUSCULAR | Status: DC
  Filled 2018-02-06: qty 12

## 2018-02-06 MED ORDER — LORATADINE 10 MG PO TABS
10.0000 mg | ORAL_TABLET | Freq: Every day | ORAL | Status: DC
  Administered 2018-02-06 – 2018-02-16 (×11): 10 mg via ORAL
  Filled 2018-02-06 (×11): qty 1

## 2018-02-06 MED ORDER — FUROSEMIDE 10 MG/ML IJ SOLN
120.0000 mg | Freq: Two times a day (BID) | INTRAMUSCULAR | Status: DC
  Filled 2018-02-06 (×2): qty 12

## 2018-02-06 MED ORDER — CARVEDILOL 3.125 MG PO TABS
3.1250 mg | ORAL_TABLET | Freq: Two times a day (BID) | ORAL | Status: DC
  Administered 2018-02-06: 3.125 mg via ORAL
  Filled 2018-02-06: qty 1

## 2018-02-06 NOTE — Consult Note (Signed)
Advanced Heart Failure Team Consult Note   Primary Physician: Reynold Bowen, MD PCP-Cardiologist:  Pixie Casino, MD   Consulting MD:   Reason for Consultation: Heart failure with massive volume overload  HPI:    Belinda [name] is a 67 y.o. 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 Debara Pickett for ADHF and massive volume overload.   She has been previously followed by Drs. Brackbill and Nahser for HTN and some volume overload. Had Myoview in 8/16 EF 70% with no evidence of ischemia. In 10/18 seen at [facility] and found to have new onset HF. Referred to Dr. Debara Pickett. Echo performed showed EF 25-30%. Creatinine ranged 2.0-2.4 so cath not performed. Presumed to have NICM from HTN.   Belinda [name] was last seen by Dr. Debara Pickett in clinic on 11/25/17. At that time, she was not tolerating entresto 97/103  and reportedly only taking it once daily. Her entresto was decreased to 49/51 bid. Her dry weight seems to be near 166 lbs. She was euvolemic in the office.   She presented to [facility] with shortness of breath at night and with minimal exertion. She says her symptoms have been going on for about 2 months. Her husband has been trying to get her to come to the ER but she refused.  She told the EDP that she was having trouble getting to the bathroom so she stopped taking her torsemide several days prior. She has had multiple falls and had been found lying on the floor several times. Says her legs just give out on her due to the weight. Not sure if she has ever lost consciousness. She has had severe edema, orthopnea and PND. Her husband says she snores very heavily and has had witnessed apnea events.  She presented to the ER on 02/02/18. BNP > 4500, CXR with mild edema. Creatinine 2.6. Glucose 652. Trop < 0.03 Weights  inaccurate (weight today 202 pounds) She was started on 80 mg IV lasix BID with moderate urine output. She is currently overall net negative 1300 cc with 1.3 L urine output yesterday. Lasix was held 02/04/18 due to hypotension. Lasix dosage has since been reduced to 40 mg BID on 02/05/18. Creatinine up to 2.95 and urine output sluggish.   Dr. Debara Pickett was consulted today and started high-dose IV lasix and ordered a PICC line for CVPs and co-ox. Urine output seems to have picked up some but she remains quite SOB.   Currently sitting up in bed eating dinner with multiple drinks on her tray. O2 sats on [place] 100%. Denies CP or pressure.    Review of Systems: [y] = yes, [ ]  = no   General: Weight gain Blue.Reese ]; Weight loss [ ] ; Anorexia [ ] ; Fatigue [ y]; Fever [ ] ; Chills [ ] ; Weakness [ y]  Cardiac: Chest pain/pressure [ ] ; Resting SOB Blue.Reese ]; Exertional SOB [ y]; Orthopnea [ y]; Pedal Edema Blue.Reese ]; Palpitations [ ] ; Syncope [ ] ; Presyncope [ ] ; Paroxysmal nocturnal dyspnea[y ]  Pulmonary: Cough Blue.Reese ]; [facility] ]; Hemoptysis[ ] ; Sputum [ y]; Snoring Blue.Reese ]  GI: Vomiting[ ] ; Dysphagia[ ] ; Melena[ ] ; Hematochezia [ ] ; Heartburn[ ] ; Abdominal pain [ ] ; Constipation [ ] ; Diarrhea [ ] ; BRBPR [ ]   GU: Hematuria[ ] ; Dysuria [ ] ;  Nocturia[ ]   Vascular: Pain in legs with walking Blue.Reese ]; Pain in feet with lying flat [ ] ; Non-healing sores [ ] ; Stroke [ ] ; TIA [ ] ; Slurred speech [ ] ;  Neuro: Headaches[ y]; Vertigo[ ] ; Seizures[ ] ; Paresthesias[ ] ;Blurred vision [ ] ; Diplopia [ ] ; Vision changes [ ]   Ortho/Skin: Arthritis Blue.Reese ]; Joint pain Blue.Reese ]; Muscle pain [ ] ; Joint swelling [ ] ; Back Pain [ y]; Rash [ ]   Psych: Depression[ ] ; Anxiety[ ]   Heme: Bleeding problems [ ] ; Clotting disorders [ ] ; Anemia [ ]   Endocrine: Diabetes Blue.Reese ]; Thyroid dysfunction[ ]   Home Medications Prior to Admission medications   Medication Sig Start Date End Date Taking? Authorizing Provider  acetaminophen (TYLENOL) 650 MG CR tablet Take 1,300 mg by  mouth every 8 (eight) hours as needed for pain (pain).   Yes [provider]  Ascorbic Acid (VITAMIN C) 1000 MG tablet Take 1,000 mg by mouth at bedtime.   Yes [provider]  aspirin 81 MG tablet Take 81 mg by mouth daily.   Yes [provider]  Azelastine HCl 0.15 % SOLN Place 1 spray into both nostrils 2 (two) times daily.   Yes [provider]  carvedilol (COREG) 25 MG tablet Take 25 mg by mouth 2 (two) times daily. 02/24/17  Yes [provider]  Cholecalciferol (VITAMIN D-3) 5000 UNITS TABS Take 5,000 Units by mouth daily.   Yes [provider]  ezetimibe-simvastatin (VYTORIN) 10-40 MG per tablet Take 1 tablet by mouth daily.   Yes [provider]  ferrous gluconate (FERGON) 324 MG tablet Take 324 mg by mouth daily with breakfast.   Yes [provider]  fexofenadine (ALLEGRA) 180 MG tablet Take 180 mg by mouth daily.   Yes [provider]  guaiFENesin (MUCINEX) 600 MG 12 hr tablet Take 600 mg by mouth daily.   Yes [provider]  HUMULIN R U-500 KWIKPEN 500 UNIT/ML kwikpen Inject 20 Units into the skin 3 (three) times daily with meals.  04/02/15  Yes [provider]  Multiple Vitamins-Minerals (ALIVE WOMENS ENERGY PO) Take 1 tablet by mouth daily.   Yes [provider]  nitroGLYCERIN (NITROSTAT) 0.4 MG SL tablet Place 0.4 mg under the tongue every 5 (five) minutes as needed for chest pain.  11/14/17  Yes [provider]  potassium chloride (KLOR-CON 10) 10 MEQ tablet Take 2 tablets (20 mEq total) by mouth 2 (two) times daily. 06/10/17  Yes Nahser, Wonda Cheng, MD  sacubitril-valsartan (ENTRESTO) 49-51 MG Take 1 tablet by mouth 2 (two) times daily. 11/25/17  Yes Pixie Casino, MD  torsemide (DEMADEX) 20 MG tablet TAKE 1 TABLET BY MOUTH TWICE A DAY 09/27/17  Yes Hilty, Nadean Corwin, MD  vitamin E 400 UNIT capsule Take 400 Units by mouth daily.   Yes [provider]    Past  Medical History: Past Medical History:  Diagnosis Date  . Acute CHF (congestive heart failure) ([place]) 02/02/2018  . Anemia    "get monthly injections to boost my HgB" (02/02/2018)  . Anxiety   . Asthma   . Back pain    intractable low back  . Chronic neck pain   . CKD (chronic kidney disease), stage III ([place])   . Dysrhythmia    palpitations or heart racing periodic. has had for years- Dr Mare Ferrari follows.  [name] GERD (gastroesophageal reflux disease)   . History of blood transfusion 2016   After knee replacemnet  . Hx  MRSA infection   . Hyperlipidemia   . Hypertension   . Migraines    "~ 2/month" (02/02/2018)  . Osteoarthritis    "knees, neck" (02/02/2018)  . Palpitations   . Pneumonia    "twice in 2018" (02/02/2018)  . Seasonal allergies   . Type II diabetes mellitus (HCC)    insulin dependent - fasting 140-200     Past Surgical History: Past Surgical History:  Procedure Laterality Date  . ABDOMINAL HYSTERECTOMY  03/2001   Abdominal supracervical hysterectomy, left salpingo-oophorectomy  . ANTERIOR CERVICAL DECOMP/DISCECTOMY FUSION N/A 01/01/2014   Procedure: CERVICAL THREE-FOUR,CERVICAL FIVE-SIX,CERVICAL SIX-SEVEN ANTERIOR CERVICAL DECOMPRESSION/DISCECTOMY/FUSION;  Surgeon: Kristeen Miss, MD;  Location: [facility];  Service: Neurosurgery;  Laterality: N/A;  left-side approach  . BACK SURGERY    . CATARACT EXTRACTION W/ INTRAOCULAR LENS  IMPLANT, BILATERAL Bilateral 2012  . CESAREAN SECTION    . DILATION AND CURETTAGE OF UTERUS  X 2  . HAND SURGERY Left 1970s   laceration repair 4th and 5th digits  . JOINT REPLACEMENT    . KNEE ARTHROSCOPY Left    torn mensicus  . NASAL SINUS SURGERY    . TOTAL KNEE ARTHROPLASTY Left 02/12/2015   Procedure: TOTAL KNEE ARTHROPLASTY;  Surgeon: [name], MD;  Location: [facility];  Service: Orthopedics;  Laterality: Left;    Family History: Family History  Problem Relation Age of Onset  . Heart disease Mother   . Heart failure Father   .  Colon cancer Neg Hx   . Stomach cancer Neg Hx   . Esophageal cancer Neg Hx     Social History: Social History   Socioeconomic History  . Marital status: Married    Spouse name: Not on file  . Number of children: 1  . Years of education: Not on file  . Highest education level: Not on file  Occupational History  . Not on file  Social Needs  . Financial resource strain: Not on file  . Food insecurity:    Worry: Not on file    Inability: Not on file  . Transportation needs:    Medical: Not on file    Non-medical: Not on file  Tobacco Use  . Smoking status: Never Smoker  . Smokeless tobacco: Never Used  Substance and Sexual Activity  . Alcohol use: Never    Frequency: Never  . Drug use: Never  . Sexual activity: Not Currently  Lifestyle  . Physical activity:    Days per week: Not on file    Minutes per session: Not on file  . Stress: Not on file  Relationships  . Social connections:    Talks on phone: Not on file    Gets together: Not on file    Attends religious service: Not on file    Active member of club or organization: Not on file    Attends meetings of clubs or organizations: Not on file    Relationship status: Not on file  Other Topics Concern  . Not on file  Social History Narrative  . Not on file    Allergies:  Allergies  Allergen Reactions  . Omnicef [Cefdinir] Other (See Comments)    GI Upset  . Aspirin Other (See Comments)    Can tolerate low aspirin  . Augmentin [Amoxicillin-Pot Clavulanate] Nausea And Vomiting and Other (See Comments)    GI issues  . Erythromycin Nausea And Vomiting and Other (See Comments)    Gi issues  . Gabapentin Swelling  .  Nickel Itching and Rash    Breakouts     Objective:    Vital Signs:   Temp:  [97.7 F (36.5 C)-97.9 F (36.6 C)] 97.7 F (36.5 C) (08/12 2114) Pulse Rate:  [71-80] 80 (08/12 2114) Resp:  [14-18] 18 (08/12 2114) BP: (102-120)/(67-86) 109/67 (08/12 2114) SpO2:  [100 %] 100 % (08/12  2114) Weight:  [34.8 kg-91.4 kg] 91.4 kg (08/12 1500) Last BM Date: 02/05/18  Weight change: Filed Weights   02/05/18 0524 02/06/18 0509 02/06/18 1500  Weight: 34.2 kg 34.8 kg 91.4 kg    Intake/Output:   Intake/Output Summary (Last 24 hours) at 02/06/2018 2214 Last data filed at 02/06/2018 1858 Gross per 24 hour  Intake 720 ml  Output 500 ml  Net 220 ml      Physical Exam    General:  Obese woman sitting up in bed eating dinner. Mildly dyspneic but in NAD HEENT: normal Neck: supple. Hard to see JVP but looks up to ear . Carotids 2+ bilat; no bruits. No lymphadenopathy or thyromegaly appreciated. Cor: PMI nonpalpable Regular rate & rhythm. 2/6 TR and MR Lungs: dull with crackles at bases Abdomen: obese  soft, nontender, + distended. Unable to appreciate hepatosplenomegaly. No bruits or masses. Good bowel sounds. Extremities: no cyanosis, clubbing, rash, 3-4+ edema into thighs and upper extremities  Neuro: alert & orientedx3, cranial nerves grossly intact. moves all 4 extremities w/o difficulty. Affect pleasant   Telemetry   NSR 80s Personally reviewed  EKG    NSR 81 Low volts with q waves inferiroly and across anterolateral precordium (No significant change since 08/30/17) Personally reviewed   Labs   Basic Metabolic Panel: Recent Labs  Lab 02/02/18 1501 02/02/18 2208 02/03/18 0352 02/04/18 0320 02/05/18 0320 02/06/18 0233  NA 136  --  138 136 135 135  K 4.1  --  3.3* 4.6 5.4* 4.6  CL 101  --  104 107 106 104  CO2 20*  --  20* 20* 20* 22  GLUCOSE 652*  --  492* 109* 160* 202*  BUN 39*  --  40* 43* 53* 61*  CREATININE 2.60* 2.58* 2.47* 2.37* 2.91* 2.95*  CALCIUM 9.0  --  8.6* 7.8* 7.6* 7.6*  MG  --  1.7  --   --   --   --     Liver Function Tests: No results for input(s): AST, ALT, ALKPHOS, BILITOT, PROT, ALBUMIN in the last 168 hours. No results for input(s): LIPASE, AMYLASE in the last 168 hours. No results for input(s): AMMONIA in the last 168  hours.  CBC: Recent Labs  Lab 02/02/18 1501 02/02/18 2208 02/03/18 0352  WBC 8.8 7.9 7.2  HGB 12.8 13.4 12.0  HCT 38.1 39.6 36.2  MCV 76.0* 76.7* 77.0*  PLT 192 189 162    Cardiac Enzymes: Recent Labs  Lab 02/02/18 2208 02/03/18 0352 02/03/18 0926  TROPONINI 0.03* <0.03 <0.03    BNP: BNP (last 3 results) Recent Labs    02/02/18 1501  BNP >4,500.0*    ProBNP (last 3 results) Recent Labs    04/26/17 1246  PROBNP 4,620*     CBG: Recent Labs  Lab 02/06/18 0505 02/06/18 0811 02/06/18 1147 02/06/18 1637 02/06/18 2118  GLUCAP 149* 83 136* 205* 271*    Coagulation Studies: No results for input(s): LABPROT, INR in the last 72 hours.   Imaging   [place] Ekg Site Rite  Result Date: 02/06/2018 If [facility] image not attached, placement could not be confirmed due  to current cardiac rhythm.  [place] Ekg Site Rite  Result Date: 02/06/2018 If Site Rite image not attached, placement could not be confirmed due to current cardiac rhythm.     Medications:     Current Medications: . carvedilol  3.125 mg Oral BID  . Chlorhexidine Gluconate Cloth  6 each Topical Q0600  . ezetimibe-simvastatin  1 tablet Oral Daily  . guaiFENesin  1,200 mg Oral BID  . heparin  5,000 Units Subcutaneous Q8H  . insulin aspart  0-15 Units Subcutaneous TID WC  . insulin glargine  20 Units Subcutaneous QHS  . loratadine  10 mg Oral Daily  . mupirocin ointment  1 application Nasal BID  . nitroGLYCERIN  0.4 mg Sublingual UD     Infusions: . sodium chloride Stopped (02/03/18 0749)  . dextrose 5 % and 0.45% NaCl 10 mL/hr at 02/05/18 0500  . furosemide 62 mL/hr at 02/06/18 2208       Patient Profile   Belinda [name] is a 67 y.o. 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 Debara Pickett for ADHF and massive  volume overload.   Assessment/Plan   1. Acute on chronic systolic HF with massive volume overload - EF 2016 was 70% - Developed new-onset HF in 10/18. EF 25-30% in 11/18 RV ok (echo reviewed personally) - Etiology likely ischemic vs hypertensive. OSA and infiltrative processes also in the DDx.  - Now admitted with massive volume overload and cardiorenal syndrome responding poorly to IV lasix with rising creatinine - Suspect possible low output.  - Agree with trial of high-dose IV lasix but will likely need inotropic support. Unsuccessful attempt to place PICC today by IV team. I will place central line in am to follow CVP and co-ox - Repeat echo - Stop b-blocker - No ACE/ARB/ARNI/spiro with A/CKD - Use Bidil for afterload reduction as BP tolerates.  - Will need further work-up for ischemic heart disease as creatinine permits (ideally cath but doubt creatinine will improve that much) - Also consider PYP  2. A/CKD - baseline creatinine ~2.0-2.5 - suspect cardiorenal syndrome - hold all nephrotoxic agents - support cardiac output as needed  3. Probable OSA - will need sleep study as outpatient  4. HTN - Has h/o severe HTN. BP ok today.   5.DM2 - Poorly controlled   Length of Stay: 4 Glori Bickers, MD  02/06/2018, 10:14 PM  Advanced Heart Failure Team Pager [phone] (M-F; 7a - 4p)  Please contact [facility] for night-coverage after hours (4p -7a ) and weekends on [email or web address]

## 2018-02-06 NOTE — Progress Notes (Addendum)
[facility]  PROGRESS NOTE  Belinda [name] XBL:[id number] DOB: [birth year] DOA: 02/02/2018 PCP: Reynold Bowen, MD   Brief HPI:    67 y.o. female with history of systolic heart failure, diabetes mellitus type 2, chronic kidney disease stage III-IV presents to the ER with complaints of increasing shortness of breath over the last few days.  Patient states she has been getting progressively short of breath on exertion and at rest and lying down.  Denies any chest pain.  Last few days she has not taken her Demadex due to difficulty walking from shortness of breath.  Patient states that her blood sugars has been running high since her Trulicity was discontinued 2 months ago.    Subjective   Patient seen and examined, continues to have dyspnea on exertion.  Still has significant bilateral lower extremity edema.   Assessment/Plan:     1. Acute on chronic systolic CHF-patient's last echo from November 2018 showed EF 25 to 30% with grade 2 diastolic dysfunction.  Patient was started on IV Lasix 80 mg every 12 hours, which i was held due to hypotension  .  Belinda [name] is currently on hold due to CKD stage IV.  Follow BMP in am.  Strict intake and output.  At home patient takes Demadex 20 mg twice a day.  Will start on Lasix 40 mill grams IV every 12 hours.  Consult cardiology for further recommendations. 2. Hypotension-likely from overdiuresis.  IV Lasix was discontinued.  Blood pressure improved after she received 500 cc normal saline bolus.   3. Uncontrolled diabetes mellitus type 2-with hyperosmolar status.  Resolved, patient was started on IV insulin in the ED.  Currently IV insulin has been tapered off.  Patient became hypoglycemic this morning, likely from getting insulin at night.  CBG was being checked every 6 hours.  Will start Lantus 20 units subcu daily, change sliding scale to moderate.  Check CBG q. before meals and at bedtime. 4. CKD stage IV-Entresto is on hold.  Follow strict intake  and output.  Creatinine is 2.95 5. Hypertension-blood pressure stable, continue Coreg , will cut down the dose of Coreg to 12.5 mg p.o. twice daily due to hypotension as above. 6. Hypokalemia-replete    DVT prophylaxis: Heparin  Code Status: Full code  Family Communication: No family at bedside  Disposition Plan: likely home when medically ready for discharge   Consultants:  None  Procedures:     Antibiotics:   Anti-infectives (From admission, onward)   None       Objective   Vitals:   02/06/18 0509 02/06/18 0815 02/06/18 1151 02/06/18 1500  BP: 103/69 117/81 112/68   Pulse: 71 71 76   Resp: 14 16 18    Temp: 97.7 F (36.5 C) 97.9 F (36.6 C) 97.7 F (36.5 C)   TempSrc: Oral Oral Oral   SpO2: 100% 100% 100%   Weight: 34.8 kg   91.4 kg  Height:        Intake/Output Summary (Last 24 hours) at 02/06/2018 1629 Last data filed at 02/06/2018 0800 Gross per 24 hour  Intake 440 ml  Output 401 ml  Net 39 ml   Filed Weights   02/05/18 0524 02/06/18 0509 02/06/18 1500  Weight: 34.2 kg 34.8 kg 91.4 kg     Physical Examination:   Eyes: No icterus, extraocular muscles intact  Mouth: Oral mucosa is moist, no lesions on palate,  Neck: Supple, no deformities, masses, or tenderness Lungs: Normal respiratory effort, decreased breath sounds  bilaterally Heart: Regular rate and rhythm, S1 and S2 normal, no murmurs, rubs auscultated Abdomen: BS normoactive,soft,nondistended,non-tender to palpation,no organomegaly Extremities: 2+ pitting edema of the lower extremities Neuro : Alert and oriented to time, place and person, No focal deficits Skin: No rashes seen on exam       Data Reviewed: I have personally reviewed following labs and imaging studies  CBG: Recent Labs  Lab 02/05/18 2017 02/06/18 0011 02/06/18 0505 02/06/18 0811 02/06/18 1147  GLUCAP 310* 255* 149* 83 136*    CBC: Recent Labs  Lab 02/02/18 1501 02/02/18 2208 02/03/18 0352  WBC 8.8  7.9 7.2  HGB 12.8 13.4 12.0  HCT 38.1 39.6 36.2  MCV 76.0* 76.7* 77.0*  PLT 192 189 332    Basic Metabolic Panel: Recent Labs  Lab 02/02/18 1501 02/02/18 2208 02/03/18 0352 02/04/18 0320 02/05/18 0320 02/06/18 0233  NA 136  --  138 136 135 135  K 4.1  --  3.3* 4.6 5.4* 4.6  CL 101  --  104 107 106 104  CO2 20*  --  20* 20* 20* 22  GLUCOSE 652*  --  492* 109* 160* 202*  BUN 39*  --  40* 43* 53* 61*  CREATININE 2.60* 2.58* 2.47* 2.37* 2.91* 2.95*  CALCIUM 9.0  --  8.6* 7.8* 7.6* 7.6*  MG  --  1.7  --   --   --   --     Recent Results (from the past 240 hour(s))  MRSA PCR Screening     Status: Abnormal   Collection Time: 02/03/18 12:21 AM  Result Value Ref Range Status   MRSA by PCR POSITIVE (A) NEGATIVE Final    Comment:        The GeneXpert MRSA Assay (FDA approved for NASAL specimens only), is one component of a comprehensive MRSA colonization surveillance program. It is not intended to diagnose MRSA infection nor to guide or monitor treatment for MRSA infections. RESULT CALLED TO, READ BACK BY AND VERIFIED WITHDoristine Johns RN [id number] 02/03/18 A BROWNING Performed at [facility], [place] [address], [place], [place] [address]       Cardiac Enzymes: Recent Labs  Lab 02/02/18 2208 02/03/18 0352 02/03/18 0926  TROPONINI 0.03* <0.03 <0.03   BNP (last 3 results) Recent Labs    02/02/18 1501  BNP >4,500.0*    ProBNP (last 3 results) Recent Labs    04/26/17 1246  PROBNP 4,620*      Studies: [place] Ekg Site Rite  Result Date: 02/06/2018 If Site Rite image not attached, placement could not be confirmed due to current cardiac rhythm.   Scheduled Meds: . carvedilol  3.125 mg Oral BID  . Chlorhexidine Gluconate Cloth  6 each Topical Q0600  . ezetimibe-simvastatin  1 tablet Oral Daily  . guaiFENesin  1,200 mg Oral BID  . heparin  5,000 Units Subcutaneous Q8H  . insulin aspart  0-15 Units Subcutaneous TID WC  . insulin glargine  20 Units Subcutaneous  QHS  . loratadine  10 mg Oral Daily  . mupirocin ointment  1 application Nasal BID  . nitroGLYCERIN  0.4 mg Sublingual UD      Time spent: 20 min  Oswald Hillock   [facility] Pager [phone]. If 7PM-7AM, please contact night-coverage at [email or web address], Office  [phone]  password [email or web address]  02/06/2018, 4:29 PM  LOS: 4 days

## 2018-02-06 NOTE — Progress Notes (Signed)
Pi cc-line team was unsuccessful getting a Picc and could not draw carboxyhemoglobin. Notified Baltazar Najjar NP on call for [facility].

## 2018-02-06 NOTE — Plan of Care (Signed)
  Problem: Education: Goal: Knowledge of General Education information will improve Description: Including pain rating scale, medication(s)/side effects and non-pharmacologic comfort measures Outcome: Progressing   Problem: Nutrition: Goal: Adequate nutrition will be maintained Outcome: Progressing   Problem: Coping: Goal: Level of anxiety will decrease Outcome: Progressing   
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
## 2018-02-06 NOTE — Progress Notes (Signed)
Cadiology fellow on call notified about the unsuccessful attempt of getting a picc-line for this patient.

## 2018-02-06 NOTE — Progress Notes (Signed)
Inpatient Diabetes Program Recommendations  AACE/ADA: New Consensus Statement on Inpatient Glycemic Control (2015)  Target Ranges:  Prepandial:   less than 140 mg/dL      Peak postprandial:   less than 180 mg/dL (1-2 hours)      Critically ill patients:  140 - 180 mg/dL   Lab Results  Component Value Date   GLUCAP 83 02/06/2018   HGBA1C 12.3 (H) 02/02/2018    Review of Glycemic Control Results for Belinda [name], Belinda [name] (MRN [id number]) as of 02/06/2018 11:00  Ref. Range 02/05/2018 16:51 02/05/2018 20:17 02/06/2018 00:11 02/06/2018 05:05 02/06/2018 08:11  Glucose-Capillary Latest Ref Range: 70 - 99 mg/dL 250 (H) 310 (H) 255 (H) 149 (H) 83   Diabetes history: DM2 Outpatient Diabetes medications: U500 15 units TID with meals , Trulicity 1.5 mg Qweek (has not taken Trulicity in 1 month) Current orders for Inpatient glycemic control: Lantus 10 units daily, Novolog 0-24 units Q4H  Inpatient Diabetes Program Recommendations:   Noted hypoglycemia yesterday post Novolog correction. -Increase Lantus to 20 units daily -Add Novolog 5 units tid meal coverage if eats 50%  Thank you, [name], RN, MSN, CDE  Diabetes Coordinator Inpatient Glycemic Control Team Team Pager [phone] (8am-5pm) 02/06/2018 11:01 AM

## 2018-02-07 ENCOUNTER — Inpatient Hospital Stay (HOSPITAL_COMMUNITY): Payer: Medicare Other

## 2018-02-07 DIAGNOSIS — N189 Chronic kidney disease, unspecified: Secondary | ICD-10-CM

## 2018-02-07 DIAGNOSIS — I340 Nonrheumatic mitral (valve) insufficiency: Secondary | ICD-10-CM

## 2018-02-07 DIAGNOSIS — N179 Acute kidney failure, unspecified: Secondary | ICD-10-CM

## 2018-02-07 LAB — BASIC METABOLIC PANEL
Anion gap: 10 (ref 5–15)
BUN: 65 mg/dL — ABNORMAL HIGH (ref 8–23)
CO2: 22 mmol/L (ref 22–32)
Calcium: 7.6 mg/dL — ABNORMAL LOW (ref 8.9–10.3)
Chloride: 103 mmol/L (ref 98–111)
Creatinine, Ser: 3.14 mg/dL — ABNORMAL HIGH (ref 0.44–1.00)
GFR calc Af Amer: 17 mL/min — ABNORMAL LOW (ref >60)
GFR calc non Af Amer: 14 mL/min — ABNORMAL LOW (ref >60)
Glucose, Bld: 342 mg/dL — ABNORMAL HIGH (ref 70–99)
Potassium: 4.6 mmol/L (ref 3.5–5.1)
Sodium: 135 mmol/L (ref 135–145)

## 2018-02-07 LAB — GLUCOSE, CAPILLARY
Glucose-Capillary: 289 mg/dL — ABNORMAL HIGH (ref 70–99)
Glucose-Capillary: 269 mg/dL — ABNORMAL HIGH (ref 70–99)
Glucose-Capillary: 291 mg/dL — ABNORMAL HIGH (ref 70–99)
Glucose-Capillary: 261 mg/dL — ABNORMAL HIGH (ref 70–99)

## 2018-02-07 LAB — COOXEMETRY PANEL
Carboxyhemoglobin: 1.8 % — ABNORMAL HIGH (ref 0.5–1.5)
Carboxyhemoglobin: 1.8 % — ABNORMAL HIGH (ref 0.5–1.5)
Carboxyhemoglobin: 1.9 % — ABNORMAL HIGH (ref 0.5–1.5)
Methemoglobin: 0.9 % (ref 0.0–1.5)
Methemoglobin: 0.9 % (ref 0.0–1.5)
Methemoglobin: 0.8 % (ref 0.0–1.5)
O2 Saturation: 83.7 %
O2 Saturation: 79 %
O2 Saturation: 77.1 %
Total hemoglobin: 10.4 g/dL — ABNORMAL LOW (ref 12.0–16.0)
Total hemoglobin: 12.4 g/dL (ref 12.0–16.0)
Total hemoglobin: 12.4 g/dL (ref 12.0–16.0)

## 2018-02-07 LAB — ECHOCARDIOGRAM COMPLETE
Height: 60 in
Weight: 3206.37 oz

## 2018-02-07 MED ORDER — METOLAZONE 5 MG PO TABS
2.5000 mg | ORAL_TABLET | Freq: Two times a day (BID) | ORAL | Status: DC
  Administered 2018-02-07 – 2018-02-12 (×11): 2.5 mg via ORAL
  Filled 2018-02-07 (×11): qty 1

## 2018-02-07 MED ORDER — SODIUM CHLORIDE 0.9% FLUSH: 10.0000 mL | INTRAVENOUS | Status: DC | PRN

## 2018-02-07 MED ORDER — FUROSEMIDE 10 MG/ML IJ SOLN
15.0000 mg/h | INTRAVENOUS | Status: DC
  Administered 2018-02-07 – 2018-02-12 (×7): 15 mg/h via INTRAVENOUS
  Filled 2018-02-07 (×3): qty 25
  Filled 2018-02-07: qty 21
  Filled 2018-02-07 (×5): qty 25
  Filled 2018-02-07: qty 21

## 2018-02-07 MED ORDER — MILRINONE LACTATE IN DEXTROSE 20-5 MG/100ML-% IV SOLN: 0.2500 ug/kg/min | INTRAVENOUS | Status: DC

## 2018-02-07 MED ORDER — SODIUM CHLORIDE 0.9% FLUSH
10.0000 mL | Freq: Two times a day (BID) | INTRAVENOUS | Status: DC
  Administered 2018-02-07 – 2018-02-15 (×12): 10 mL

## 2018-02-07 NOTE — Progress Notes (Signed)
Inpatient Diabetes Program Recommendations  AACE/ADA: New Consensus Statement on Inpatient Glycemic Control (2015)  Target Ranges:  Prepandial:   less than 140 mg/dL      Peak postprandial:   less than 180 mg/dL (1-2 hours)      Critically ill patients:  140 - 180 mg/dL   Lab Results  Component Value Date   GLUCAP 289 (H) 02/07/2018   HGBA1C 12.3 (H) 02/02/2018    Review of Glycemic Control  Diabetes history:DM2 Outpatient Diabetes medications:U50015 units TID with meals, Trulicity 1.5 mg Qweek(has not taken Trulicity in 1 month) Current orders for Inpatient glycemic control: Lantus 20 units daily, Novolog 0-24 units Q4H  qd Inpatient Diabetes Program Recommendations:   -Fasting CBG remains elevated -Increase Lantus 30 units qd -Add Novolog meal coverage 10 units tid if eats 50%  Thank you, Belinda Roys E. Gevorg Brum, RN, MSN, CDE  Diabetes Coordinator Inpatient Glycemic Control Team Team Pager [phone] (8am-5pm) 02/07/2018 10:58 AM

## 2018-02-07 NOTE — Progress Notes (Signed)
Peripherally Inserted Central Catheter/Midline Placement  The IV Nurse has discussed with the patient and/or persons authorized to consent for the patient, the purpose of this procedure and the potential benefits and risks involved with this procedure.  The benefits include less needle sticks, lab draws from the catheter, and the patient may be discharged home with the catheter. Risks include, but not limited to, infection, bleeding, blood clot (thrombus formation), and puncture of an artery; nerve damage and irregular heartbeat and possibility to perform a PICC exchange if needed/ordered by physician.  Alternatives to this procedure were also discussed.  Bard Power PICC patient education guide, fact sheet on infection prevention and patient information card has been provided to patient /or left at bedside.    PICC/Midline Placement Documentation  PICC Double Lumen 60/15/61 PICC Right Basilic 42 cm 0 cm (Active)  Indication for Insertion or Continuance of Line Chronic illness with exacerbations (CF, Sickle Cell, etc.);Prolonged intravenous therapies 02/07/2018  8:57 AM  Exposed Catheter (cm) 0 cm 02/07/2018  8:57 AM  Site Assessment Clean;Dry;Intact 02/07/2018  8:57 AM  Lumen #1 Status Flushed;Saline locked;Blood return noted 02/07/2018  8:57 AM  Lumen #2 Status Flushed;Saline locked;Blood return noted 02/07/2018  8:57 AM  Dressing Type Transparent;Securing device 02/07/2018  8:57 AM  Dressing Status Clean;Dry;Intact;Antimicrobial disc in place 02/07/2018  8:57 AM  Dressing Change Due 02/14/18 02/07/2018  8:57 AM       Frances Maywood 02/07/2018, 9:00 AM

## 2018-02-07 NOTE — Progress Notes (Addendum)
Advanced Heart Failure Rounding Note  PCP-Cardiologist: Pixie Casino, MD   Subjective:     Yesterday diuresed with IV lasix. Weight down 1 pound. Still very volume overloaded. Had episode of urinary retention last night.   PICC team placed PICC in RUE this morning.   + SOB and orthopnea. + bloating.    Objective:   Weight Range: 90.9 kg Body mass index is 39.14 kg/m.   Vital Signs:   Temp:  [97.4 F (36.3 C)-97.8 F (36.6 C)] 97.7 F (36.5 C) (08/13 0419) Pulse Rate:  [76-82] 77 (08/13 0419) Resp:  [14-18] 15 (08/13 0419) BP: (109-120)/(65-86) 118/68 (08/13 0419) SpO2:  [100 %] 100 % (08/13 0419) Weight:  [90.9 kg-91.4 kg] 90.9 kg (08/13 0419) Last BM Date: 02/06/18  Weight change: Filed Weights   02/06/18 0509 02/06/18 1500 02/07/18 0419  Weight: 34.8 kg 91.4 kg 90.9 kg    Intake/Output:   Intake/Output Summary (Last 24 hours) at 02/07/2018 0821 Last data filed at 02/07/2018 0100 Gross per 24 hour  Intake 1022 ml  Output 500 ml  Net 522 ml      Physical Exam    General:  Dyspneic talking  HEENT: Normal anicteric Neck: Supple. JVP to jaw Carotids 2+ bilat; no bruits. No lymphadenopathy or thyromegaly appreciated. Cor: PMI nondisplaced. Regular rate & rhythm. 2/6 TR Lungs:dull at bases with crackles Abdomen: Soft, nontender, +distended. No hepatosplenomegaly. No bruits or masses. Good bowel sounds. Extremities: No cyanosis, clubbing, rash, RLE LLE 3+ edema. RUE PICC Neuro: alert & oriented x 3, cranial nerves grossly intact. moves all 4 extremities w/o difficulty. Affect pleasant  Telemetry   NSR 70-80s Personally reviewed  EKG    N/A   Labs    CBC No results for input(s): WBC, NEUTROABS, HGB, HCT, MCV, PLT in the last 72 hours. Basic Metabolic Panel Recent Labs    02/06/18 0233 02/07/18 0408  NA 135 135  K 4.6 4.6  CL 104 103  CO2 22 22  GLUCOSE 202* 342*  BUN 61* 65*  CREATININE 2.95* 3.14*  CALCIUM 7.6* 7.6*   Liver  Function Tests No results for input(s): AST, ALT, ALKPHOS, BILITOT, PROT, ALBUMIN in the last 72 hours. No results for input(s): LIPASE, AMYLASE in the last 72 hours. Cardiac Enzymes No results for input(s): CKTOTAL, CKMB, CKMBINDEX, TROPONINI in the last 72 hours.  BNP: BNP (last 3 results) Recent Labs    02/02/18 1501  BNP >4,500.0*    ProBNP (last 3 results) Recent Labs    04/26/17 1246  PROBNP 4,620*     D-Dimer No results for input(s): DDIMER in the last 72 hours. Hemoglobin A1C No results for input(s): HGBA1C in the last 72 hours. Fasting Lipid Panel No results for input(s): CHOL, HDL, LDLCALC, TRIG, CHOLHDL, LDLDIRECT in the last 72 hours. Thyroid Function Tests No results for input(s): TSH, T4TOTAL, T3FREE, THYROIDAB in the last 72 hours.  Invalid input(s): FREET3  Other results:   Imaging    [place] Ekg Site Rite  Result Date: 02/06/2018 If Site Rite image not attached, placement could not be confirmed due to current cardiac rhythm.  [place] Ekg Site Rite  Result Date: 02/06/2018 If Site Rite image not attached, placement could not be confirmed due to current cardiac rhythm.     Medications:     Scheduled Medications: . ezetimibe-simvastatin  1 tablet Oral Daily  . guaiFENesin  1,200 mg Oral BID  . heparin  5,000 Units Subcutaneous Q8H  . insulin  aspart  0-15 Units Subcutaneous TID WC  . insulin glargine  20 Units Subcutaneous QHS  . loratadine  10 mg Oral Daily  . mupirocin ointment  1 application Nasal BID  . nitroGLYCERIN  0.4 mg Sublingual UD     Infusions: . sodium chloride Stopped (02/03/18 0749)  . dextrose 5 % and 0.45% NaCl 10 mL/hr at 02/05/18 0500  . furosemide 62 mL/hr at 02/06/18 2208     PRN Medications:  acetaminophen **OR** acetaminophen, dextrose, ondansetron **OR** ondansetron (ZOFRAN) IV    Patient Profile  Belinda Carmack Simmonsis a 67 y.o.femalewith a hx of obesity, chronic combined systolic and diastolic heart failure  due to premed NICM (ECHO 11/18 EF 25-30%), DM, HTN, HLD, and CKD stage IIIwho is being seen today for the evaluation of at the request of Dr. Debara Pickett for ADHF and massive volume overload.   Assessment/Plan  1. Acute on chronic systolic HF with massive volume overload - EF 2016 was 70% - Developed new-onset HF in 10/18. EF 25-30% in 11/18 RV ok (echo reviewed personally) - Etiology likely ischemic vs hypertensive. OSA and infiltrative processes also in the DDx.  -Admitted with massive volume overload and cardiorenal syndrome responding poorly to IV lasix with rising creatinine - PICC placed. Check CVP and CO-OX . WIll need to add milrinone after CO-OX obtained.  -Add ted hosel.  -  Repeat echo -Marked volume overload. Stop intermittent lasix. Start lasix drip 15 mg per hour.  - Off BB.  - No ACE/ARB/ARNI/spiro with A/CKD - Renal function trending up- .  - Will need further work-up for ischemic heart disease as creatinine permits (ideally cath but doubt creatinine will improve that much) - Also consider PYP  2. A/CKD - baseline creatinine ~2.0-2.5. Followed by Dr Hollie Salk. - Creatinine trending up 2.9>3.14 - suspect cardiorenal syndrome - hold all nephrotoxic agents - support cardiac output as needed  3. Probable OSA - will need sleep study as outpatient  4. HTN - Has h/o severe HTN. BP ok today.   5.DM2 - Poorly controlled    Medication concerns reviewed with patient and pharmacy team. Barriers identified: n/a   Length of Stay: 5  Amy Clegg, NP  02/07/2018, 8:21 AM  Advanced Heart Failure Team Pager [phone] (M-F; 7a - 4p)  Please contact [facility] for night-coverage after hours (4p -7a ) and weekends on [email or web address]  Patient seen and examined with Darrick Grinder, NP. We discussed all aspects of the encounter. I agree with the assessment and plan as stated above.   Remains markedly volume overloaded. CVP 13-14 (checked personally). Co-ox checked multiple times. 75-80%.  Creatinine continues to climb.   Echo reviewed personally EF 45-50% with mild RV HK. Moderate TR. Small to moderate pericardial effusion echo texture suggestive of possible amyloid.   On exam, bladder seems distended and I am worried about recurrent urinary retention Place Foley.   Based on co-ox cardiac output is sufficient so this raises the question if renal dysfunction is driving force of volume retention. Renal u/s normal in 2018. Will repeat. Will check SPEP/UPEP to work up for possible AL amyloid. Can consider PYP scan to look for TTR amyloid.   Continue aggressive IV diuresis.   Glori Bickers, MD  6:10 PM

## 2018-02-07 NOTE — Progress Notes (Signed)
  Coox 83%.   Discussed with Dr. Haroldine Laws. No milrinone for now.    Legrand Como [address]" [name], [place] 02/07/2018 12:27 PM

## 2018-02-07 NOTE — Progress Notes (Signed)
Right arm edematous prior to PICC placement.

## 2018-02-07 NOTE — Progress Notes (Addendum)
[facility]  PROGRESS NOTE  VICTORIAH WILDS VPX:[id number] DOB: 1950/10/21 DOA: 02/02/2018 PCP: Reynold Bowen, MD   Brief HPI:    67 y.o. female with history of systolic heart failure, diabetes mellitus type 2, chronic kidney disease stage III-IV presents to the ER with complaints of increasing shortness of breath over the last few days.  Patient states she has been getting progressively short of breath on exertion and at rest and lying down.  Denies any chest pain.  Last few days she has not taken her Demadex due to difficulty walking from shortness of breath.  Patient states that her blood sugars has been running high since her Trulicity was discontinued 2 months ago.    Subjective   Patient seen and examined, started on IV Lasix drip as per cardiology.   Assessment/Plan:     1. Acute on chronic systolic CHF-patient's last echo from November 2018 showed EF 25 to 30% with grade 2 diastolic dysfunction.  Patient was started on IV Lasix 80 mg every 12 hours, which i was held due to hypotension    [name] is currently on hold due to CKD stage IV.  Follow BMP in am.  Strict intake and output.  At home patient takes Demadex 20 mg twice a day.  Patient was started on IV Lasix 40 mg IV every 12 hours, started on Lasix drip at 50 mg/h per heart failure team.  Follow BMP in am. 2. Hypotension-likely from overdiuresis.  IV Lasix was discontinued.  Blood pressure improved after she received 500 cc normal saline bolus.   3. Uncontrolled diabetes mellitus type 2-with hyperosmolar status.  Resolved, patient was started on IV insulin in the ED.  Currently IV insulin has been tapered off.  Patient became hypoglycemic this morning, likely from getting insulin at night.  CBG was being checked every 6 hours.  Continue Lantus 20 units subcu daily, change sliding scale to moderate.  Check CBG q. before meals and at bedtime. 4. AKI on CKD stage IV-Entresto is on hold.  Follow strict intake and output.   Creatinine is elevated at 3.14.  Hold nephrotoxic agents. 5. Hypertension-blood pressure stable, Coreg has been discontinued per cardiology 6. Hypokalemia-replete    DVT prophylaxis: Heparin  Code Status: Full code  Family Communication: No family at bedside  Disposition Plan: likely home when medically ready for discharge   Consultants:  None  Procedures:     Antibiotics:   Anti-infectives (From admission, onward)   None       Objective   Vitals:   02/06/18 2114 02/07/18 0000 02/07/18 0419 02/07/18 1104  BP: 109/67 113/65 118/68 121/70  Pulse: 80 82 77 76  Resp: 18 14 15 12   Temp: 97.7 F (36.5 C) (!) 97.4 F (36.3 C) 97.7 F (36.5 C) (!) 97.5 F (36.4 C)  TempSrc: Oral Oral Oral Oral  SpO2: 100% 100% 100% 100%  Weight:   90.9 kg   Height:        Intake/Output Summary (Last 24 hours) at 02/07/2018 1405 Last data filed at 02/07/2018 0900 Gross per 24 hour  Intake 932 ml  Output 1400 ml  Net -468 ml   Filed Weights   02/06/18 0509 02/06/18 1500 02/07/18 0419  Weight: 34.8 kg 91.4 kg 90.9 kg     Physical Examination:    Mouth: Oral mucosa is moist, no lesions on palate,  Neck: Supple, no deformities, masses, or tenderness Lungs: Normal respiratory effort,  Heart: Regular rate and rhythm, S1 and  S2 normal, no murmurs, rubs auscultated Abdomen: BS normoactive,soft,nondistended,non-tender to palpation,no organomegaly Extremities: 2+ edema of the lower extremities Neuro : Alert and oriented to time, place and person, No focal deficits        Data Reviewed: I have personally reviewed following labs and imaging studies  CBG: Recent Labs  Lab 02/06/18 1147 02/06/18 1637 02/06/18 2118 02/07/18 0609 02/07/18 1107  GLUCAP 136* 205* 271* 289* 269*    CBC: Recent Labs  Lab 02/02/18 1501 02/02/18 2208 02/03/18 0352  WBC 8.8 7.9 7.2  HGB 12.8 13.4 12.0  HCT 38.1 39.6 36.2  MCV 76.0* 76.7* 77.0*  PLT 192 189 162    Basic  Metabolic Panel: Recent Labs  Lab 02/02/18 2208 02/03/18 0352 02/04/18 0320 02/05/18 0320 02/06/18 0233 02/07/18 0408  NA  --  138 136 135 135 135  K  --  3.3* 4.6 5.4* 4.6 4.6  CL  --  104 107 106 104 103  CO2  --  20* 20* 20* 22 22  GLUCOSE  --  492* 109* 160* 202* 342*  BUN  --  40* 43* 53* 61* 65*  CREATININE 2.58* 2.47* 2.37* 2.91* 2.95* 3.14*  CALCIUM  --  8.6* 7.8* 7.6* 7.6* 7.6*  MG 1.7  --   --   --   --   --     Recent Results (from the past 240 hour(s))  MRSA PCR Screening     Status: Abnormal   Collection Time: 02/03/18 12:21 AM  Result Value Ref Range Status   MRSA by PCR POSITIVE (A) NEGATIVE Final    Comment:        The GeneXpert MRSA Assay (FDA approved for NASAL specimens only), is one component of a comprehensive MRSA colonization surveillance program. It is not intended to diagnose MRSA infection nor to guide or monitor treatment for MRSA infections. RESULT CALLED TO, READ BACK BY AND VERIFIED WITHDoristine Johns RN [id number] 02/03/18 A BROWNING Performed at [facility], [place] [address] [place], [place] [address]       Cardiac Enzymes: Recent Labs  Lab 02/02/18 2208 02/03/18 0352 02/03/18 0926  TROPONINI 0.03* <0.03 <0.03   BNP (last 3 results) Recent Labs    02/02/18 1501  BNP >4,500.0*    ProBNP (last 3 results) Recent Labs    04/26/17 1246  PROBNP 4,620*      Studies: [place] Ekg Site Rite  Result Date: 02/06/2018 If Site Rite image not attached, placement could not be confirmed due to current cardiac rhythm.  [place] Ekg Site Rite  Result Date: 02/06/2018 If Site Rite image not attached, placement could not be confirmed due to current cardiac rhythm.   Scheduled Meds: . ezetimibe-simvastatin  1 tablet Oral Daily  . guaiFENesin  1,200 mg Oral BID  . heparin  5,000 Units Subcutaneous Q8H  . insulin aspart  0-15 Units Subcutaneous TID WC  . insulin glargine  20 Units Subcutaneous QHS  . loratadine  10 mg Oral Daily  . mupirocin  ointment  1 application Nasal BID  . nitroGLYCERIN  0.4 mg Sublingual UD  . sodium chloride flush  10-40 mL Intracatheter Q12H      Time spent: 20 min  Oswald Hillock   [facility] Pager [phone]. If 7PM-7AM, please contact night-coverage at [email or web address], Office  [phone]  password [email or web address]  02/07/2018, 2:05 PM  LOS: 5 days

## 2018-02-07 NOTE — Progress Notes (Signed)
  Echocardiogram 2D Echocardiogram has been performed.  Gavon Majano G Iretta Mangrum 02/07/2018, 1:07 PM

## 2018-02-07 NOTE — Progress Notes (Signed)
Patient has not voided for about 7 hours. Patient stated that she has the urge but not able. Bladder scan patient at 0450 and volume of 550. Cardiology on call paged, provider does not want a foley cath at this time. Staff will conitinue to monitor.

## 2018-02-08 DIAGNOSIS — I5021 Acute systolic (congestive) heart failure: Secondary | ICD-10-CM

## 2018-02-08 LAB — BASIC METABOLIC PANEL
Anion gap: 9 (ref 5–15)
BUN: 71 mg/dL — ABNORMAL HIGH (ref 8–23)
CO2: 24 mmol/L (ref 22–32)
Calcium: 7.7 mg/dL — ABNORMAL LOW (ref 8.9–10.3)
Chloride: 101 mmol/L (ref 98–111)
Creatinine, Ser: 3.1 mg/dL — ABNORMAL HIGH (ref 0.44–1.00)
GFR calc Af Amer: 17 mL/min — ABNORMAL LOW (ref >60)
GFR calc non Af Amer: 15 mL/min — ABNORMAL LOW (ref >60)
Glucose, Bld: 307 mg/dL — ABNORMAL HIGH (ref 70–99)
Potassium: 4.3 mmol/L (ref 3.5–5.1)
Sodium: 134 mmol/L — ABNORMAL LOW (ref 135–145)

## 2018-02-08 LAB — COOXEMETRY PANEL
Carboxyhemoglobin: 1.5 % (ref 0.5–1.5)
Methemoglobin: 1.5 % (ref 0.0–1.5)
O2 Saturation: 81 %
Total hemoglobin: 11.5 g/dL — ABNORMAL LOW (ref 12.0–16.0)

## 2018-02-08 LAB — GLUCOSE, CAPILLARY
Glucose-Capillary: 245 mg/dL — ABNORMAL HIGH (ref 70–99)
Glucose-Capillary: 166 mg/dL — ABNORMAL HIGH (ref 70–99)
Glucose-Capillary: 201 mg/dL — ABNORMAL HIGH (ref 70–99)
Glucose-Capillary: 310 mg/dL — ABNORMAL HIGH (ref 70–99)

## 2018-02-08 LAB — MAGNESIUM: Magnesium: 1.6 mg/dL — ABNORMAL LOW (ref 1.7–2.4)

## 2018-02-08 MED ORDER — FLUTICASONE PROPIONATE 50 MCG/ACT NA SUSP
2.0000 | Freq: Every day | NASAL | Status: DC
  Administered 2018-02-08 – 2018-02-15 (×8): 2 via NASAL
  Filled 2018-02-08 (×3): qty 16

## 2018-02-08 MED ORDER — INSULIN GLARGINE 100 UNIT/ML ~~LOC~~ SOLN
30.0000 [IU] | Freq: Every day | SUBCUTANEOUS | Status: DC
  Administered 2018-02-08: 30 [IU] via SUBCUTANEOUS
  Filled 2018-02-08: qty 0.3

## 2018-02-08 MED ORDER — MAGNESIUM SULFATE 2 GM/50ML IV SOLN
2.0000 g | Freq: Once | INTRAVENOUS | Status: AC
  Administered 2018-02-08: 2 g via INTRAVENOUS
  Filled 2018-02-08: qty 50

## 2018-02-08 NOTE — Progress Notes (Addendum)
PROGRESS NOTE    Belinda [name]  XNA:[id number] DOB: [birth year] DOA: 02/02/2018 PCP: Reynold Bowen, MD   Brief Narrative: Patient is a 67 year old female with past medical history of systolic heart failure, diabetes type 2, chronic kidney disease stage III-IV who presented to the emergency department with complaints of increased shortness of breath for last few days.  Denies any chest pain.  Reported she had not taken her Demadex at home.  Patient was admitted for the management of acute on chronic systolic CHF.  Heart failure team following.  Assessment & Plan:   Active Problems:   Acute systolic (congestive) heart failure (HCC)   Essential hypertension   Acute CHF (congestive heart failure) (HCC)   DM (diabetes mellitus), type 2 with renal complications (HCC)   Pressure injury of skin   Hyperglycemia   Renal failure (ARF), acute on chronic (HCC)  Acute on chronic systolic CHF: Echocardiogram showed reduced systolic function with ejection fraction of 45 to 40%.  Mild diffuse hypokinesis, grade 1 diastolic dysfunction.  Heart failure team following.  Currently on Lasix drip.  Patient still complains of dyspnea even at rest.  [name] is currently on hold due to AKI and CKD.  We will continue to monitor BMP. Cardiology planning for further work-up to rule out ischemic heart disease.  Hypertension: Currently blood pressure stable.  We will continue to monitor.  Coreg discontinued.  AKI on  CKD stage IV: Most likely secondary to cardiorenal syndrome.  Continue diuresis.  Entresto on hold.  Avoid nephrotoxic agents.Creatinine today at 3.14.  Hypokalemia/hypomagnesemia: Currently being supplemented.  Obstructive sleep apnea: Sleep study as an outpatient.  Diabetes type 2: Increased the dose of Lantus.  Continue current regimen.  Deconditioning/debility: Will request for physical therapy evaluation  DVT prophylaxis: Heparin [place] Code Status: Full Family Communication: None present at  the bedside Disposition Plan: Likely home after stabilization of heart failure   Consultants: Heart failure  Procedures: None  Antimicrobials: None  Subjective: Patient seen and examined the bedside this morning.  Complains of shortness of breath on minimal exertion and cough.  No overnight fever, nausea vomiting.  Objective: Vitals:   02/07/18 2020 02/08/18 0003 02/08/18 0419 02/08/18 0630  BP: 139/72 126/76 127/72   Pulse: 91 88 83   Resp: 11 13 16    Temp: 98.2 F (36.8 C) 98.2 F (36.8 C) 98.3 F (36.8 C)   TempSrc: Oral Oral Oral   SpO2: 100% 100% 100%   Weight:    90.4 kg  Height:        Intake/Output Summary (Last 24 hours) at 02/08/2018 0920 Last data filed at 02/08/2018 0431 Gross per 24 hour  Intake 678.34 ml  Output 1625 ml  Net -946.66 ml   Filed Weights   02/06/18 1500 02/07/18 0419 02/08/18 0630  Weight: 91.4 kg 90.9 kg 90.4 kg    Examination:  General exam:Not in distress,obese HEENT:PERRL,Oral mucosa moist, Ear/Nose normal on gross exam Respiratory system: Bilateral decreased air entry on the bases Cardiovascular system: S1 & S2 heard, RRR. No JVD, murmurs, rubs, gallops or clicks.  Pedal edema. Gastrointestinal system: Abdomen is nondistended, soft and nontender. No organomegaly or masses felt. Normal bowel sounds heard. Central nervous system: Alert and oriented. No focal neurological deficits. Extremities: Pedal edema, no clubbing ,no cyanosis, distal peripheral pulses palpable. Skin: No rashes, lesions or ulcers,no icterus ,no pallor MSK: Normal muscle bulk,tone ,power Psychiatry: Judgement and insight appear normal. Mood & affect appropriate.     Data Reviewed: I  have personally reviewed following labs and imaging studies  CBC: Recent Labs  Lab 02/02/18 1501 02/02/18 2208 02/03/18 0352  WBC 8.8 7.9 7.2  HGB 12.8 13.4 12.0  HCT 38.1 39.6 36.2  MCV 76.0* 76.7* 77.0*  PLT 192 189 720   Basic Metabolic Panel: Recent Labs  Lab  02/02/18 2208  02/04/18 0320 02/05/18 0320 02/06/18 0233 02/07/18 0408 02/08/18 0500  NA  --    < > 136 135 135 135 134*  K  --    < > 4.6 5.4* 4.6 4.6 4.3  CL  --    < > 107 106 104 103 101  CO2  --    < > 20* 20* 22 22 24   GLUCOSE  --    < > 109* 160* 202* 342* 307*  BUN  --    < > 43* 53* 61* 65* 71*  CREATININE 2.58*   < > 2.37* 2.91* 2.95* 3.14* 3.10*  CALCIUM  --    < > 7.8* 7.6* 7.6* 7.6* 7.7*  MG 1.7  --   --   --   --   --  1.6*   < > = values in this interval not displayed.   GFR: Estimated Creatinine Clearance: 17.9 mL/min (A) (by C-G formula based on SCr of 3.1 mg/dL (H)). Liver Function Tests: No results for input(s): AST, ALT, ALKPHOS, BILITOT, PROT, ALBUMIN in the last 168 hours. No results for input(s): LIPASE, AMYLASE in the last 168 hours. No results for input(s): AMMONIA in the last 168 hours. Coagulation Profile: No results for input(s): INR, PROTIME in the last 168 hours. Cardiac Enzymes: Recent Labs  Lab 02/02/18 2208 02/03/18 0352 02/03/18 0926  TROPONINI 0.03* <0.03 <0.03   BNP (last 3 results) Recent Labs    04/26/17 1246  PROBNP 4,620*   HbA1C: No results for input(s): HGBA1C in the last 72 hours. CBG: Recent Labs  Lab 02/07/18 0609 02/07/18 1107 02/07/18 1614 02/07/18 2036 02/08/18 0603  GLUCAP 289* 269* 291* 261* 245*   Lipid Profile: No results for input(s): CHOL, HDL, LDLCALC, TRIG, CHOLHDL, LDLDIRECT in the last 72 hours. Thyroid Function Tests: No results for input(s): TSH, T4TOTAL, FREET4, T3FREE, THYROIDAB in the last 72 hours. Anemia Panel: No results for input(s): VITAMINB12, FOLATE, FERRITIN, TIBC, IRON, RETICCTPCT in the last 72 hours. Sepsis Labs: Recent Labs  Lab 02/03/18 0724  PROCALCITON 0.42    Recent Results (from the past 240 hour(s))  MRSA PCR Screening     Status: Abnormal   Collection Time: 02/03/18 12:21 AM  Result Value Ref Range Status   MRSA by PCR POSITIVE (A) NEGATIVE Final    Comment:          The GeneXpert MRSA Assay (FDA approved for NASAL specimens only), is one component of a comprehensive MRSA colonization surveillance program. It is not intended to diagnose MRSA infection nor to guide or monitor treatment for MRSA infections. RESULT CALLED TO, READ BACK BY AND VERIFIED WITHDoristine Johns RN [id number] 02/03/18 A BROWNING Performed at [facility], [place] [address], [place], [place] [address]          Radiology Studies: US Renal  Result Date: 02/08/2018 CLINICAL DATA:  Acute on chronic renal failure. History of acute congestive heart failure, chronic kidney disease, hypertension, diabetes. EXAM: RENAL / URINARY TRACT ULTRASOUND COMPLETE COMPARISON:  02/03/2017 FINDINGS: Right Kidney: Length: 10.7 cm. Echogenicity within normal limits. No mass or hydronephrosis visualized. There is a small amount of fluid  around the kidney and liver edge. Left Kidney: Length: 10.7 cm. Echogenicity within normal limits. Small cyst of the upper pole measuring 1.8 cm diameter. No hydronephrosis. Small amount of free fluid around the kidney and spleen edge. Bladder: Bladder is decompressed with a Foley catheter in place. Small bilateral pleural effusions are demonstrated. IMPRESSION: 1. Left renal cyst.  No hydronephrosis in either kidney. 2. Small amount of free fluid in the abdomen, likely ascites. Small bilateral pleural effusions. 3. Foley catheter decompresses the bladder. Electronically Signed   By: Lucienne Capers M.D.   On: 02/08/2018 00:23   [place] Ekg Site Rite  Result Date: 02/06/2018 If Site Rite image not attached, placement could not be confirmed due to current cardiac rhythm.  [place] Ekg Site Rite  Result Date: 02/06/2018 If Site Rite image not attached, placement could not be confirmed due to current cardiac rhythm.       Scheduled Meds: . ezetimibe-simvastatin  1 tablet Oral Daily  . guaiFENesin  1,200 mg Oral BID  . heparin  5,000 Units Subcutaneous Q8H  . insulin aspart  0-15  Units Subcutaneous TID WC  . insulin glargine  30 Units Subcutaneous QHS  . loratadine  10 mg Oral Daily  . metolazone  2.5 mg Oral BID  . nitroGLYCERIN  0.4 mg Sublingual UD  . sodium chloride flush  10-40 mL Intracatheter Q12H   Continuous Infusions: . sodium chloride Stopped (02/03/18 0749)  . dextrose 5 % and 0.45% NaCl 10 mL/hr at 02/05/18 0500  . furosemide (LASIX) infusion 15 mg/hr (02/08/18 0559)  . magnesium sulfate 1 - 4 g bolus IVPB       LOS: 6 days    Time spent: 25 mins.More than 50% of that time was spent in counseling and/or coordination of care.      Shelly Coss, MD [facility] Pager [phone]  If 7PM-7AM, please contact night-coverage [email or web address] Password [email or web address] 02/08/2018, 9:20 AM

## 2018-02-08 NOTE — Progress Notes (Addendum)
Advanced Heart Failure Rounding Note  PCP-Cardiologist: Pixie Casino, MD   Subjective:    Wilburn Mylar she was switched to lasix drip @ 15 mg per hour. Increased urine output. PICC placed with CO-OX 83%. Creatinine unchanged 3.1>3.1. Foley placed. Had 1200cc out already this am.   Renal u/s. Normal kidneys no hydro.   Echo (8/13) reviewed personally EF 45-50% with mild RV HK. Moderate TR. Small to moderate pericardial effusion echo texture suggestive of possible amyloid.   Feeling better. Still mildly dyspneic but no PND or orthopnea. Able to sleep most of the night. Feeling less bloated. Weak.    Objective:   Weight Range: 90.4 kg Body mass index is 38.92 kg/m.   Vital Signs:   Temp:  [97.5 F (36.4 C)-98.3 F (36.8 C)] 98.3 F (36.8 C) (08/14 0419) Pulse Rate:  [76-91] 83 (08/14 0419) Resp:  [7-16] 16 (08/14 0419) BP: (121-139)/(70-76) 127/72 (08/14 0419) SpO2:  [100 %] 100 % (08/14 0419) Weight:  [90.4 kg] 90.4 kg (08/14 0630) Last BM Date: 02/07/18  Weight change: Filed Weights   02/06/18 1500 02/07/18 0419 02/08/18 0630  Weight: 91.4 kg 90.9 kg 90.4 kg    Intake/Output:   Intake/Output Summary (Last 24 hours) at 02/08/2018 0830 Last data filed at 02/08/2018 0431 Gross per 24 hour  Intake 828.34 ml  Output 2525 ml  Net -1696.66 ml      Physical Exam  CVP ~10   General:   Lying in bed No resp difficulty HEENT: normal Neck: supple. JVP ~12. Carotids 2+ bilat; no bruits. No lymphadenopathy or thryomegaly appreciated. Cor: PMI nondisplaced. Regular rate & rhythm. No rubs, gallops. 2/6 TR. Lungs: Decreased on 3 liters. [place]onson No wheeze Abdomen: soft, nontender, nondistended. No hepatosplenomegaly. No bruits or masses. Good bowel sounds. Extremities: no cyanosis, clubbing, rash, R and LLE 3+ edema with ted hose.RUE PICC Neuro: alert & oriented x 3, cranial nerves grossly intact. moves all 4 extremities w/o difficulty. Affect pleasant    Telemetry   NSR  70s very low volts personally reviewed.   EKG    N/A   Labs    CBC No results for input(s): WBC, NEUTROABS, HGB, HCT, MCV, PLT in the last 72 hours. Basic Metabolic Panel Recent Labs    02/07/18 0408 02/08/18 0500  NA 135 134*  K 4.6 4.3  CL 103 101  CO2 22 24  GLUCOSE 342* 307*  BUN 65* 71*  CREATININE 3.14* 3.10*  CALCIUM 7.6* 7.7*  MG  --  1.6*   Liver Function Tests No results for input(s): AST, ALT, ALKPHOS, BILITOT, PROT, ALBUMIN in the last 72 hours. No results for input(s): LIPASE, AMYLASE in the last 72 hours. Cardiac Enzymes No results for input(s): CKTOTAL, CKMB, CKMBINDEX, TROPONINI in the last 72 hours.  BNP: BNP (last 3 results) Recent Labs    02/02/18 1501  BNP >4,500.0*    ProBNP (last 3 results) Recent Labs    04/26/17 1246  PROBNP 4,620*     D-Dimer No results for input(s): DDIMER in the last 72 hours. Hemoglobin A1C No results for input(s): HGBA1C in the last 72 hours. Fasting Lipid Panel No results for input(s): CHOL, HDL, LDLCALC, TRIG, CHOLHDL, LDLDIRECT in the last 72 hours. Thyroid Function Tests No results for input(s): TSH, T4TOTAL, T3FREE, THYROIDAB in the last 72 hours.  Invalid input(s): FREET3  Other results:   Imaging    US Renal  Result Date: 02/08/2018 CLINICAL DATA:  Acute on chronic renal failure.  History of acute congestive heart failure, chronic kidney disease, hypertension, diabetes. EXAM: RENAL / URINARY TRACT ULTRASOUND COMPLETE COMPARISON:  02/03/2017 FINDINGS: Right Kidney: Length: 10.7 cm. Echogenicity within normal limits. No mass or hydronephrosis visualized. There is a small amount of fluid around the kidney and liver edge. Left Kidney: Length: 10.7 cm. Echogenicity within normal limits. Small cyst of the upper pole measuring 1.8 cm diameter. No hydronephrosis. Small amount of free fluid around the kidney and spleen edge. Bladder: Bladder is decompressed with a Foley catheter in place. Small bilateral  pleural effusions are demonstrated. IMPRESSION: 1. Left renal cyst.  No hydronephrosis in either kidney. 2. Small amount of free fluid in the abdomen, likely ascites. Small bilateral pleural effusions. 3. Foley catheter decompresses the bladder. Electronically Signed   By: Lucienne Capers M.D.   On: 02/08/2018 00:23     Medications:     Scheduled Medications: . ezetimibe-simvastatin  1 tablet Oral Daily  . guaiFENesin  1,200 mg Oral BID  . heparin  5,000 Units Subcutaneous Q8H  . insulin aspart  0-15 Units Subcutaneous TID WC  . insulin glargine  20 Units Subcutaneous QHS  . loratadine  10 mg Oral Daily  . metolazone  2.5 mg Oral BID  . nitroGLYCERIN  0.4 mg Sublingual UD  . sodium chloride flush  10-40 mL Intracatheter Q12H    Infusions: . sodium chloride Stopped (02/03/18 0749)  . dextrose 5 % and 0.45% NaCl 10 mL/hr at 02/05/18 0500  . furosemide (LASIX) infusion 15 mg/hr (02/08/18 0559)    PRN Medications: acetaminophen **OR** acetaminophen, dextrose, ondansetron **OR** ondansetron (ZOFRAN) IV, sodium chloride flush    Patient Profile  Shenia S Simmonsis a 67 y.o.femalewith a hx of obesity, chronic combined systolic and diastolic heart failure due to premed NICM (ECHO 11/18 EF 25-30%), DM, HTN, HLD, and CKD stage IIIwho is being seen today for the evaluation of at the request of Dr. Debara Pickett for ADHF and massive volume overload.   Assessment/Plan  1. Acute on chronic systolic HF with massive volume overload - EF 2016 was 70% - Developed new-onset HF in 10/18. EF 25-30% in 11/18 RV ok (echo reviewed personally) - Etiology likely ischemic vs hypertensive. OSA and infiltrative processes also in the DDx.  -Admitted with massive volume overload and cardiorenal syndrome responding poorly to IV lasix with rising creatinine.  02/07/2018 -->ECHO EF 45-50% Grade IDD RV mildly dilated. Peak PA pressure 43 mm hg.  - CO-OX 81%.  - CVP coming down but with marked lower exteremity  edema. Improved urine output. Continue lasix drip 15 mg per hour. - Off BB.  - No ACE/ARB/ARNI/spiro with A/CKD - Will need further work-up for ischemic heart disease as creatinine permits (ideally cath but doubt creatinine will improve that much) - Also consider PYP  2. A/CKD - baseline creatinine ~2.0-2.5. Followed by Dr Hollie Salk. - Creatinine trending up 2.9>3.14->3.1  - suspect cardiorenal syndrome - hold all nephrotoxic agents - support cardiac output as needed  3. Probable OSA - will need sleep study as outpatient  4. HTN - Has h/o severe HTN. BP ok today.   5.DM2 - Poorly controlled    Medication concerns reviewed with patient and pharmacy team. Barriers identified: n/a   Length of Stay: 6  Amy Clegg, NP  02/08/2018, 8:30 AM  Advanced Heart Failure Team Pager [phone] (M-F; [place])  Please contact [facility] for night-coverage after hours (4p -7a ) and weekends on [email or web address]  Patient seen and examined with  Darrick Grinder, NP. We discussed all aspects of the encounter. I agree with the assessment and plan as stated above.   Creatinine remains elevated but starting to diurese. Renal u/s remarkably normal. Co-ox normal suggests adequate cardiac output. Echo with only mildly reduced EF. Suggestive of amyloid. Myeloma panel sent. Remains markedly volume overloaded. Will continue IV diuresis. Will need PYP scan to check for TTR amyloid when volume status improved.   Glori Bickers, MD  11:16 AM

## 2018-02-08 NOTE — Evaluation (Signed)
Physical Therapy Evaluation Patient Details Name: Belinda [name] MRN: [id number] DOB: Nov 26, 1950 Today's Date: 02/08/2018   History of Present Illness  Belinda [name] is a 67 y.o F who presented to the ED with SOB and weakness. PMH includes CHF, HTN, HLD, CKD, Orthopnea, K9TO with complications, Acute Renal Failure, and a history of falling.  Clinical Impression  Pt presents with problems above and deficits below. Pt is significant for generalized weakness and edema. All mobility performed with 3L of O2. Pt performed bed mobility with ModA and ModA+2. Pt performed sit<>stand transfers with HHA and Mod-Max+2. Pt reported the room was spinning with standing, and was returned to supine. BP in supine at beginning of session was 128/86 mmHg, HR 81. BP at EOB was 147/100 mmHg, HR 82. BP at the bicep upon returning to supine was 135/74 mmHg, HR 81. Pt has decreased caregiver support, is at an increased risk for falls, and has decreased ability to perform ADLs. Will continue to follow acutely to support independence, mobility, and safety.     Follow Up Recommendations SNF;Supervision/Assistance - 24 hour    Equipment Recommendations  None recommended by PT    Recommendations for Other Services OT consult     Precautions / Restrictions Precautions Precautions: Fall Precaution Comments: Pt reports falling at home and "lying on the floor for what seems like forever"  Restrictions Weight Bearing Restrictions: No      Mobility  Bed Mobility Overal bed mobility: Needs Assistance Bed Mobility: Sit to Supine;Sidelying to Sit;Rolling Rolling: Mod assist Sidelying to sit: Mod assist;+2 for physical assistance   Sit to supine: Mod assist;+2 for physical assistance   General bed mobility comments: Pt required ModA and use of bedpad to perform rolling. Pt required ModA+2 to perform sidelying->sit. Pt required cues for sequencing, and could not move LE without assist. Pt required ModA+2 for trunk  elevation, move LE, and to scoot hips to EOB. BP in supine was 128/86 mmHg, HR 81. BP at EOB was 147/100 mmHg, HR 82. Pt reported some dizziness with sitting up, that reduced with breathing in through her nose. Pt reported fatigue and SOB with sitting up, but O2 WFL on 3L.   Transfers Overall transfer level: Needs assistance Equipment used: 2 person hand held assist Transfers: Sit to/from Stand Sit to Stand: Mod assist;Max assist;+2 physical assistance         General transfer comment: Pt required Mod-Max+2 to perform sit<>stand transfers with HHA. Pt had difficulty with initial power up and with hip extension once standing. Pt reported the room was "spinning" with standing, and was returned to sitting. BP upon returning to supine BP at the bicep was 135/74 mmHg, HR 81. Pt reported dizziness improved upon returning to supine.    Ambulation/Gait             General Gait Details: Deferred due to pt dizziness  Stairs            Wheelchair Mobility    Modified Rankin (Stroke Patients Only)       Balance Overall balance assessment: Needs assistance Sitting-balance support: Single extremity supported;Feet supported;Bilateral upper extremity supported Sitting balance-Leahy Scale: Poor Sitting balance - Comments: Pt heavily reliant on UE support when sitting at EOB, even if cued to sit without support.  When using Unilateral UE support, pt would lean heavily to that side.    Standing balance support: Bilateral upper extremity supported Standing balance-Leahy Scale: Poor Standing balance comment: Pt reliant on BUE support. Pt had difficulty  standing and likely would have been unsteady without support.                              Pertinent Vitals/Pain Pain Assessment: No/denies pain    Home Living Family/patient expects to be discharged to:: Private residence Living Arrangements: Spouse/significant other Available Help at Discharge: Available  PRN/intermittently;Family Type of Home: House Home Access: Stairs to enter Entrance Stairs-Rails: None Entrance Stairs-Number of Steps: 1(Step in from garage) Home Layout: One level Home Equipment: Walker - 2 wheels;Grab bars - tub/shower;Shower seat - built in;Cane - quad Additional Comments: Pt's husband has "two broken knees" can can't help her at home that much. Pt reports her son is in [place], and cannot help much either. Pt's sister lives in [place] and may be able to stop in intermittently.    Prior Function Level of Independence: Independent with assistive device(s);Independent         Comments: Pt reports using a cane to get around in the house when she feels particularly unsteady.      Hand Dominance   Dominant Hand: Left    Extremity/Trunk Assessment   Upper Extremity Assessment Upper Extremity Assessment: Defer to OT evaluation    Lower Extremity Assessment Lower Extremity Assessment: Generalized weakness    Cervical / Trunk Assessment Cervical / Trunk Assessment: Other exceptions Cervical / Trunk Exceptions: Pt sat with rounded shoulders and had difficulty sitting up tall, even with support.   Communication   Communication: No difficulties  Cognition Arousal/Alertness: Awake/alert Behavior During Therapy: WFL for tasks assessed/performed Overall Cognitive Status: Within Functional Limits for tasks assessed                                        General Comments      Exercises     Assessment/Plan    PT Assessment Patient needs continued PT services  PT Problem List Decreased range of motion;Decreased strength;Decreased activity tolerance;Decreased balance;Decreased mobility;Decreased knowledge of use of DME;Cardiopulmonary status limiting activity       PT Treatment Interventions Gait training;DME instruction;Stair training;Functional mobility training;Therapeutic activities;Therapeutic exercise;Balance training;Patient/family  education    PT Goals (Current goals can be found in the Care Plan section)  Acute Rehab PT Goals Patient Stated Goal: To go home PT Goal Formulation: With patient Time For Goal Achievement: 02/22/18 Potential to Achieve Goals: Fair    Frequency Min 2X/week   Barriers to discharge Decreased caregiver support Pt's husband has "two broken knees" can can't help her at home that much. Pt reports her son is in [place], and cannot help much either. Pt's sister lives in [place] and may be able to stop in intermittently.    Co-evaluation               AM-PAC PT "6 Clicks" Daily Activity  Outcome Measure Difficulty turning over in bed (including adjusting bedclothes, sheets and blankets)?: Unable Difficulty moving from lying on back to sitting on the side of the bed? : Unable Difficulty sitting down on and standing up from a chair with arms (e.g., wheelchair, bedside commode, etc,.)?: Unable Help needed moving to and from a bed to chair (including a wheelchair)?: A Lot Help needed walking in hospital room?: Total Help needed climbing 3-5 steps with a railing? : Total 6 Click Score: 7    End of Session Equipment Utilized During Treatment:  Gait belt;Oxygen Activity Tolerance: Patient limited by fatigue;Treatment limited secondary to medical complications (Comment)(Dizziness) Patient left: in bed;with call bell/phone within reach Nurse Communication: Mobility status PT Visit Diagnosis: Unsteadiness on feet (R26.81);Other abnormalities of gait and mobility (R26.89);Repeated falls (R29.6);Muscle weakness (generalized) (M62.81);History of falling (Z91.81);Difficulty in walking, not elsewhere classified (R26.2)    Time: 8550-1586 PT Time Calculation (min) (ACUTE ONLY): 40 min   Charges:   PT Evaluation $PT Eval Moderate Complexity: 1 Mod PT Treatments $Therapeutic Activity: 23-37 mins      Elwin Mocha, S-DPT Acute Care Rehab Student [phone]   {02/08/2018, 3:44 PM

## 2018-02-09 LAB — COOXEMETRY PANEL
Carboxyhemoglobin: 1.7 % — ABNORMAL HIGH (ref 0.5–1.5)
Methemoglobin: 1.7 % — ABNORMAL HIGH (ref 0.0–1.5)
O2 Saturation: 82.3 %
Total hemoglobin: 11.3 g/dL — ABNORMAL LOW (ref 12.0–16.0)

## 2018-02-09 LAB — BASIC METABOLIC PANEL
Anion gap: 9 (ref 5–15)
BUN: 72 mg/dL — ABNORMAL HIGH (ref 8–23)
CO2: 26 mmol/L (ref 22–32)
Calcium: 7.9 mg/dL — ABNORMAL LOW (ref 8.9–10.3)
Chloride: 99 mmol/L (ref 98–111)
Creatinine, Ser: 3.08 mg/dL — ABNORMAL HIGH (ref 0.44–1.00)
GFR calc Af Amer: 17 mL/min — ABNORMAL LOW (ref >60)
GFR calc non Af Amer: 15 mL/min — ABNORMAL LOW (ref >60)
Glucose, Bld: 316 mg/dL — ABNORMAL HIGH (ref 70–99)
Potassium: 4 mmol/L (ref 3.5–5.1)
Sodium: 134 mmol/L — ABNORMAL LOW (ref 135–145)

## 2018-02-09 LAB — GLUCOSE, CAPILLARY
Glucose-Capillary: 218 mg/dL — ABNORMAL HIGH (ref 70–99)
Glucose-Capillary: 207 mg/dL — ABNORMAL HIGH (ref 70–99)
Glucose-Capillary: 203 mg/dL — ABNORMAL HIGH (ref 70–99)
Glucose-Capillary: 247 mg/dL — ABNORMAL HIGH (ref 70–99)

## 2018-02-09 LAB — MAGNESIUM: Magnesium: 1.9 mg/dL (ref 1.7–2.4)

## 2018-02-09 MED ORDER — INSULIN GLARGINE 100 UNIT/ML ~~LOC~~ SOLN
40.0000 [IU] | Freq: Every day | SUBCUTANEOUS | Status: DC
  Administered 2018-02-09 – 2018-02-13 (×5): 40 [IU] via SUBCUTANEOUS
  Filled 2018-02-09 (×5): qty 0.4

## 2018-02-09 MED ORDER — INSULIN GLARGINE 100 UNIT/ML ~~LOC~~ SOLN
10.0000 [IU] | Freq: Once | SUBCUTANEOUS | Status: AC
  Administered 2018-02-09: 10 [IU] via SUBCUTANEOUS
  Filled 2018-02-09 (×2): qty 0.1

## 2018-02-09 NOTE — Progress Notes (Addendum)
Advanced Heart Failure Rounding Note  PCP-Cardiologist: Pixie Casino, MD   Subjective:    Continues to diurese with lasix drip @ 15 mg per hour + metolazone.  Negative 2.6 liters. Weight down 5 pounds. (down 7 pounds today)   Feeling some better today. Denies SOB. No orthopnea or PND. No dizziness. Hungry.    Renal u/s. Normal kidneys no hydro.   Echo (8/13) reviewed personally EF 45-50% with mild RV HK. Moderate TR. Small to moderate pericardial effusion echo texture suggestive of possible amyloid.     Objective:   Weight Range: 88.2 kg Body mass index is 37.98 kg/m.   Vital Signs:   Temp:  [97.6 F (36.4 C)-98.8 F (37.1 C)] 98.8 F (37.1 C) (08/15 0746) Pulse Rate:  [83-94] 85 (08/15 0746) Resp:  [10-19] 12 (08/15 0746) BP: (128-140)/(67-87) 140/82 (08/15 0746) SpO2:  [99 %-100 %] 100 % (08/15 0746) Weight:  [88.2 kg] 88.2 kg (08/15 0341) Last BM Date: 02/07/18  Weight change: Filed Weights   02/07/18 0419 02/08/18 0630 02/09/18 0341  Weight: 90.9 kg 90.4 kg 88.2 kg    Intake/Output:   Intake/Output Summary (Last 24 hours) at 02/09/2018 0754 Last data filed at 02/09/2018 0747 Gross per 24 hour  Intake 1335.41 ml  Output 5050 ml  Net -3714.59 ml      Physical Exam   CVP 6-7 personally checked General:   No resp difficulty HEENT: normal anicteric  Neck: supple.JVP 6-7 . Carotids 2+ bilat; no bruits. No lymphadenopathy or thryomegaly appreciated. Cor: PMI nondisplaced. Regular rate & rhythm. No rubs, gallops. 2/6 TR. Lungs: clear on 3 liters  Decreased at bases  Abdomen: obese soft, nontender, nondistended. No hepatosplenomegaly. No bruits or masses. Good bowel sounds. Extremities: no cyanosis, clubbing, rash, R and LLE 2+ with ted hose Neuro: alert & oriented x 3, cranial nerves grossly intact. moves all 4 extremities w/o difficulty. Affect pleasant    Telemetry   NSR 80s Personally reviewed   EKG    N/A   Labs    CBC No results  for input(s): WBC, NEUTROABS, HGB, HCT, MCV, PLT in the last 72 hours. Basic Metabolic Panel Recent Labs    02/08/18 0500 02/09/18 0400  NA 134* 134*  K 4.3 4.0  CL 101 99  CO2 24 26  GLUCOSE 307* 316*  BUN 71* 72*  CREATININE 3.10* 3.08*  CALCIUM 7.7* 7.9*  MG 1.6* 1.9   Liver Function Tests No results for input(s): AST, ALT, ALKPHOS, BILITOT, PROT, ALBUMIN in the last 72 hours. No results for input(s): LIPASE, AMYLASE in the last 72 hours. Cardiac Enzymes No results for input(s): CKTOTAL, CKMB, CKMBINDEX, TROPONINI in the last 72 hours.  BNP: BNP (last 3 results) Recent Labs    02/02/18 1501  BNP >4,500.0*    ProBNP (last 3 results) Recent Labs    04/26/17 1246  PROBNP 4,620*     D-Dimer No results for input(s): DDIMER in the last 72 hours. Hemoglobin A1C No results for input(s): HGBA1C in the last 72 hours. Fasting Lipid Panel No results for input(s): CHOL, HDL, LDLCALC, TRIG, CHOLHDL, LDLDIRECT in the last 72 hours. Thyroid Function Tests No results for input(s): TSH, T4TOTAL, T3FREE, THYROIDAB in the last 72 hours.  Invalid input(s): FREET3  Other results:   Imaging    No results found.   Medications:     Scheduled Medications: . ezetimibe-simvastatin  1 tablet Oral Daily  . fluticasone  2 spray Each Nare QHS  .  guaiFENesin  1,200 mg Oral BID  . heparin  5,000 Units Subcutaneous Q8H  . insulin aspart  0-15 Units Subcutaneous TID WC  . insulin glargine  30 Units Subcutaneous QHS  . loratadine  10 mg Oral Daily  . metolazone  2.5 mg Oral BID  . nitroGLYCERIN  0.4 mg Sublingual UD  . sodium chloride flush  10-40 mL Intracatheter Q12H    Infusions: . sodium chloride Stopped (02/03/18 0749)  . dextrose 5 % and 0.45% NaCl Stopped (02/08/18 1147)  . furosemide (LASIX) infusion 15 mg/hr (02/09/18 0400)    PRN Medications: acetaminophen **OR** acetaminophen, dextrose, ondansetron **OR** ondansetron (ZOFRAN) IV, sodium chloride  flush    Patient Profile  Belinda S Simmonsis a 67 y.o.femalewith a hx of obesity, chronic combined systolic and diastolic heart failure due to premed NICM (ECHO 11/18 EF 25-30%), DM, HTN, HLD, and CKD stage IIIwho is being seen today for the evaluation of at the request of Dr. Debara Pickett for ADHF and massive volume overload.   Assessment/Plan  1. Acute on chronic systolic HF with massive volume overload - EF 2016 was 70% - Developed new-onset HF in 10/18. EF 25-30% in 11/18 RV ok (echo reviewed personally) - Etiology likely ischemic vs hypertensive. OSA and infiltrative processes also in the DDx.  -Admitted with massive volume overload and cardiorenal syndrome responding poorly to IV lasix with rising creatinine.  02/07/2018 -->ECHO EF 45-50% Grade IDD RV mildly dilated. Peak PA pressure 43 mm hg.  - CO-OX remains stable.   - Volume status improving. Continue lasix drip 15 mg per hour + metolazone 2.5 mg twice a day.  - Renal function ok. Follow daily.  - Off BB.  - No ACE/ARB/ARNI/spiro with A/CKD - Will need further work-up for ischemic heart disease as creatinine permits (ideally cath but doubt creatinine will improve that much) - Also consider PYP  2. A/CKD - baseline creatinine ~2.0-2.5. Followed by Dr Hollie Salk. - Creatinine trending up 2.9>3.14->3.1 ->3.08  -Follow BMET daily.  - suspect cardiorenal syndrome - hold all nephrotoxic agents - support cardiac output as needed  3. Probable OSA - will need sleep study as outpatient  4. HTN - Has h/o severe HTN. BP ok today.   5.DM2 - Poorly controlled    Medication concerns reviewed with patient and pharmacy team. Barriers identified: n/a   Length of Stay: 7  Amy Clegg, NP  02/09/2018, 7:54 AM  Advanced Heart Failure Team Pager [phone] (M-F; 7a - 4p)  Please contact [facility] for night-coverage after hours (4p -7a ) and weekends on [email or web address]  Patient seen and examined with Darrick Grinder, NP. We discussed all  aspects of the encounter. I agree with the assessment and plan as stated above.   Diuresis picking up. Renal function stable. Breathing better. Still with significant volume overload. Will continue IV diuresis. Echo texture suggestive of amyloid. Myeloma panel in progress. Will need PY scan prior to d/c. Co-ox ok.   Glori Bickers, MD  9:25 AM

## 2018-02-09 NOTE — Progress Notes (Signed)
PROGRESS NOTE    Belinda [name]  ZOX:[id number] DOB: Aug 13, 1950 DOA: 02/02/2018 PCP: Reynold Bowen, MD   Brief Narrative: Patient is a 67 year old female with past medical history of systolic heart failure, diabetes type 2, chronic kidney disease stage III-IV who presented to the emergency department with complaints of increased shortness of breath for last few days.  Denies any chest pain.  Reportedly she had not taken her Demadex at home.  Patient was admitted for the management of acute on chronic systolic CHF.  Heart failure team following.  Assessment & Plan:   Active Problems:   Acute systolic (congestive) heart failure (HCC)   Essential hypertension   Acute CHF (congestive heart failure) (HCC)   DM (diabetes mellitus), type 2 with renal complications (HCC)   Pressure injury of skin   Hyperglycemia   Renal failure (ARF), acute on chronic (HCC)  Acute on chronic systolic CHF: Echocardiogram showed reduced systolic function with ejection fraction of 45 to 40%.  Mild diffuse hypokinesis, grade 1 diastolic dysfunction.  Heart failure team following.  Currently on Lasix drip and metalazone.  Patient still complains of dyspnea .  Belinda [name] is currently on hold due to AKI and CKD.  We will continue to monitor BMP. Cardiology wants to rule out amyloidosis.  Myeloma panel sent. She continues to have excellent diuresis but he still has severe peripheral edema.  Hypertension: Currently blood pressure stable.  We will continue to monitor.  Coreg discontinued.  AKI on  CKD stage IV: Most likely secondary to cardiorenal syndrome.  Continue diuresis.  Entresto on hold.  Avoid nephrotoxic agents.Creatinine today at 3.09.  Hypokalemia/hypomagnesemia: Supplemented.  Obstructive sleep apnea: Sleep study as an outpatient.  Diabetes type 2: Increased the dose of Lantus.  Continue current regimen.  Deconditioning/debility: Physical therapy evaluated and recommended SNF  DVT prophylaxis: Heparin  [place] Code Status: Full Family Communication: None present at the bedside Disposition Plan: Likely SNF after stabilization of heart failure   Consultants: Heart failure  Procedures: None  Antimicrobials: None  Subjective: Patient seen and examined the bedside this morning.  Still complains of shortness of breath on minimal exertion .  No overnight fever, nausea vomiting.  Has severe edema on bilateral upper extremities  Objective: Vitals:   02/08/18 2359 02/09/18 0341 02/09/18 0746 02/09/18 1119  BP: 131/76 137/87 140/82 128/68  Pulse: 94 86 85 84  Resp: 14 12 12 14   Temp: 98.8 F (37.1 C) 98 F (36.7 C) 98.8 F (37.1 C) 97.8 F (36.6 C)  TempSrc: Oral Oral Axillary Oral  SpO2: 99% 100% 100% 100%  Weight:  88.2 kg    Height:        Intake/Output Summary (Last 24 hours) at 02/09/2018 1134 Last data filed at 02/09/2018 1122 Gross per 24 hour  Intake 1335.41 ml  Output 4700 ml  Net -3364.59 ml   Filed Weights   02/07/18 0419 02/08/18 0630 02/09/18 0341  Weight: 90.9 kg 90.4 kg 88.2 kg    Examination:  General exam:Not in distress,obese HEENT:PERRL,Oral mucosa moist, Ear/Nose normal on gross exam Respiratory system: Bilateral decreased air entry on the bases,crackles on bases Cardiovascular system: S1 & S2 heard, RRR. No JVD, murmurs, rubs, gallops or clicks.   Gastrointestinal system: Abdomen is nondistended, soft and nontender. No organomegaly or masses felt. Normal bowel sounds heard. Central nervous system: Alert and oriented. No focal neurological deficits. Extremities: Severe peripheral edema, no clubbing ,no cyanosis, distal peripheral pulses palpable. Skin: No rashes, lesions or ulcers,no icterus ,no  pallor MSK: Normal muscle bulk,tone ,power Psychiatry: Judgement and insight appear normal. Mood & affect appropriate.     Data Reviewed: I have personally reviewed following labs and imaging studies  CBC: Recent Labs  Lab 02/02/18 1501 02/02/18 2208  02/03/18 0352  WBC 8.8 7.9 7.2  HGB 12.8 13.4 12.0  HCT 38.1 39.6 36.2  MCV 76.0* 76.7* 77.0*  PLT 192 189 700   Basic Metabolic Panel: Recent Labs  Lab 02/02/18 2208  02/05/18 0320 02/06/18 0233 02/07/18 0408 02/08/18 0500 02/09/18 0400  NA  --    < > 135 135 135 134* 134*  K  --    < > 5.4* 4.6 4.6 4.3 4.0  CL  --    < > 106 104 103 101 99  CO2  --    < > 20* 22 22 24 26   GLUCOSE  --    < > 160* 202* 342* 307* 316*  BUN  --    < > 53* 61* 65* 71* 72*  CREATININE 2.58*   < > 2.91* 2.95* 3.14* 3.10* 3.08*  CALCIUM  --    < > 7.6* 7.6* 7.6* 7.7* 7.9*  MG 1.7  --   --   --   --  1.6* 1.9   < > = values in this interval not displayed.   GFR: Estimated Creatinine Clearance: 17.8 mL/min (A) (by C-G formula based on SCr of 3.08 mg/dL (H)). Liver Function Tests: No results for input(s): AST, ALT, ALKPHOS, BILITOT, PROT, ALBUMIN in the last 168 hours. No results for input(s): LIPASE, AMYLASE in the last 168 hours. No results for input(s): AMMONIA in the last 168 hours. Coagulation Profile: No results for input(s): INR, PROTIME in the last 168 hours. Cardiac Enzymes: Recent Labs  Lab 02/02/18 2208 02/03/18 0352 02/03/18 0926  TROPONINI 0.03* <0.03 <0.03   BNP (last 3 results) Recent Labs    04/26/17 1246  PROBNP 4,620*   HbA1C: No results for input(s): HGBA1C in the last 72 hours. CBG: Recent Labs  Lab 02/08/18 1110 02/08/18 1613 02/08/18 2052 02/09/18 0552 02/09/18 1116  GLUCAP 166* 201* 310* 218* 207*   Lipid Profile: No results for input(s): CHOL, HDL, LDLCALC, TRIG, CHOLHDL, LDLDIRECT in the last 72 hours. Thyroid Function Tests: No results for input(s): TSH, T4TOTAL, FREET4, T3FREE, THYROIDAB in the last 72 hours. Anemia Panel: No results for input(s): VITAMINB12, FOLATE, FERRITIN, TIBC, IRON, RETICCTPCT in the last 72 hours. Sepsis Labs: Recent Labs  Lab 02/03/18 0724  PROCALCITON 0.42    Recent Results (from the past 240 hour(s))  MRSA PCR  Screening     Status: Abnormal   Collection Time: 02/03/18 12:21 AM  Result Value Ref Range Status   MRSA by PCR POSITIVE (A) NEGATIVE Final    Comment:        The GeneXpert MRSA Assay (FDA approved for NASAL specimens only), is one component of a comprehensive MRSA colonization surveillance program. It is not intended to diagnose MRSA infection nor to guide or monitor treatment for MRSA infections. RESULT CALLED TO, READ BACK BY AND VERIFIED WITHDoristine Johns RN 1749 02/03/18 A BROWNING Performed at [facility], [place] [address], [place], [place] [address]          Radiology Studies: US Renal  Result Date: 02/08/2018 CLINICAL DATA:  Acute on chronic renal failure. History of acute congestive heart failure, chronic kidney disease, hypertension, diabetes. EXAM: RENAL / URINARY TRACT ULTRASOUND COMPLETE COMPARISON:  02/03/2017 FINDINGS: Right  Kidney: Length: 10.7 cm. Echogenicity within normal limits. No mass or hydronephrosis visualized. There is a small amount of fluid around the kidney and liver edge. Left Kidney: Length: 10.7 cm. Echogenicity within normal limits. Small cyst of the upper pole measuring 1.8 cm diameter. No hydronephrosis. Small amount of free fluid around the kidney and spleen edge. Bladder: Bladder is decompressed with a Foley catheter in place. Small bilateral pleural effusions are demonstrated. IMPRESSION: 1. Left renal cyst.  No hydronephrosis in either kidney. 2. Small amount of free fluid in the abdomen, likely ascites. Small bilateral pleural effusions. 3. Foley catheter decompresses the bladder. Electronically Signed   By: Lucienne Capers M.D.   On: 02/08/2018 00:23        Scheduled Meds: . ezetimibe-simvastatin  1 tablet Oral Daily  . fluticasone  2 spray Each Nare QHS  . guaiFENesin  1,200 mg Oral BID  . heparin  5,000 Units Subcutaneous Q8H  . insulin aspart  0-15 Units Subcutaneous TID WC  . insulin glargine  40 Units Subcutaneous QHS  .  loratadine  10 mg Oral Daily  . metolazone  2.5 mg Oral BID  . nitroGLYCERIN  0.4 mg Sublingual UD  . sodium chloride flush  10-40 mL Intracatheter Q12H   Continuous Infusions: . sodium chloride Stopped (02/03/18 0749)  . dextrose 5 % and 0.45% NaCl Stopped (02/08/18 1147)  . furosemide (LASIX) infusion 15 mg/hr (02/09/18 0400)     LOS: 7 days    Time spent: 25 mins.More than 50% of that time was spent in counseling and/or coordination of care.      Shelly Coss, MD [facility] Pager [phone]  If 7PM-7AM, please contact night-coverage [email or web address] Password [facility] 02/09/2018, 11:34 AM

## 2018-02-09 NOTE — Progress Notes (Signed)
Pt complained of congestion in nares and wanted to try to clear them.  The on call Hospitalist was paged and ordered Flonase 2 spray both nares daily at bedtime.  Lupita Dawn, RN

## 2018-02-09 NOTE — Evaluation (Signed)
Occupational Therapy Evaluation Patient Details Name: Belinda [name] MRN: [id number] DOB: 25-Oct-1950 Today's Date: 02/09/2018    History of Present Illness Wyndi Northrup is a 67 y.o F who presented to the ED with SOB and weakness. PMH includes CHF, HTN, HLD, CKD, Orthopnea, D2KG with complications, Acute Renal Failure, and a history of falling.   Clinical Impression   This 67 y/o female presents with the above. At baseline pt reports intermittently using SPC and RW for functional mobility, reports she is typically independent with ADLs but has recently required assist prior to this admission due to UEs and LEs feeling "heavy" and pt with increased weakness. Pt currently requiring modA+2 for bed mobility, sit<>stand and use of modA+2 (HHA) for stand pivot transfer to recliner. Pt currently requires minAfor UB ADL, max-totalA for LB and toileting ADLs. Pt reporting increased dizziness with mobility this session which subsides with rest. BP monitored and stable during transfers. Pt does report increased dizziness when turning head side to side and noted slight nystagmus with visual assessment, will continue to further assess. Pt will benefit from continued acute OT services and recommend follow up therapy services in SNF setting after discharge to maximize her overall safety and independence with ADLs and mobility. Will follow.     Follow Up Recommendations  SNF;Supervision/Assistance - 24 hour    Equipment Recommendations  Other (comment)(TBD in next venue )           Precautions / Restrictions Precautions Precautions: Fall Precaution Comments: pt reports increased number of falls at home prior to this admission Restrictions Weight Bearing Restrictions: No      Mobility Bed Mobility Overal bed mobility: Needs Assistance Bed Mobility: Supine to Sit Rolling: Mod assist Sidelying to sit: Mod assist;+2 for physical assistance Supine to sit: Mod assist;+2 for physical assistance;+2  for safety/equipment Sit to supine: Mod assist;+2 for physical assistance   General bed mobility comments: assist to advance LEs towards EOB and to elevate trunk with use of bed pad to assist with scooting hips.   Transfers Overall transfer level: Needs assistance Equipment used: 2 person hand held assist Transfers: Sit to/from [facility] Sit to Stand: Mod assist;+2 physical assistance;+2 safety/equipment Stand pivot transfers: Mod assist;+2 physical assistance;+2 safety/equipment       General transfer comment: +2 HHA utilized for sit<>stand with assist to rise and steady from EOB. pt able to take small pivotal steps to step towards recliner with +2 steadying assist.     Balance Overall balance assessment: Needs assistance Sitting-balance support: Single extremity supported;Feet supported;Bilateral upper extremity supported Sitting balance-Leahy Scale: Fair Sitting balance - Comments: close minguard for static sitting    Standing balance support: Bilateral upper extremity supported Standing balance-Leahy Scale: Poor Standing balance comment: reliant on external assist                           ADL either performed or assessed with clinical judgement   ADL Overall ADL's : Needs assistance/impaired Eating/Feeding: Set up;Sitting Eating/Feeding Details (indicate cue type and reason): setup to open containers  Grooming: Set up;Sitting   Upper Body Bathing: Min guard;Sitting   Lower Body Bathing: Moderate assistance;+2 for physical assistance;+2 for safety/equipment;Sit to/from stand   Upper Body Dressing : Minimal assistance;Sitting   Lower Body Dressing: Maximal assistance;+2 for physical assistance;+2 for safety/equipment;Sit to/from stand   Toilet Transfer: Moderate assistance;+2 for safety/equipment;+2 for physical assistance;Stand-pivot;BSC Toilet Transfer Details (indicate cue type and reason): simulated in transfer  to recliner  Toileting-  Clothing Manipulation and Hygiene: Maximal assistance;+2 for physical assistance;+2 for safety/equipment;Sit to/from stand       Functional mobility during ADLs: Moderate assistance;+2 for physical assistance;+2 for safety/equipment(for stand pivot ) General ADL Comments: pt performing bed mobility and transfer OOB to recliner for breakfast, pt reports increased dizziness with mobility but subsides with rest, BP monitored, initially 123/76 once sitting EOB, 129/88 after transfer to recliner; SpO2 100% on 3L     Vision Baseline Vision/History: Wears glasses Wears Glasses: At all times Patient Visual Report: Other (comment) Vision Assessment?: Yes Alignment/Gaze Preference: Other (comment)(difficulty aiming gaze to focus on target ) Tracking/Visual Pursuits: Decreased smoothness of horizontal tracking Saccades: Additional eye shifts occurred during testing Additional Comments: slight nystagmus noted with visual assessment; pt reports increased dizziness when she turns her head side to side      Perception     Praxis      Pertinent Vitals/Pain Pain Assessment: No/denies pain     Hand Dominance Left   Extremity/Trunk Assessment Upper Extremity Assessment Upper Extremity Assessment: Generalized weakness;RUE deficits/detail;LUE deficits/detail RUE Deficits / Details: edemous UEs, limiting full ROM and fine motor abilities  RUE Coordination: decreased fine motor LUE Deficits / Details: edemous UEs, limiting full ROM and fine motor abilities  LUE Coordination: decreased fine motor   Lower Extremity Assessment Lower Extremity Assessment: Defer to PT evaluation   Cervical / Trunk Assessment Cervical / Trunk Assessment: Other exceptions Cervical / Trunk Exceptions: Pt sat with rounded shoulders and had difficulty sitting up tall, even with support.    Communication Communication Communication: No difficulties   Cognition Arousal/Alertness: Awake/alert Behavior During Therapy: WFL  for tasks assessed/performed Overall Cognitive Status: Within Functional Limits for tasks assessed                                     General Comments       Exercises General Exercises - Upper Extremity Digit Composite Flexion: AROM;10 reps;Both;Seated Composite Extension: AROM;10 reps;Both;Seated General Exercises - Lower Extremity Ankle Circles/Pumps: AROM;Both;Seated;10 reps   Shoulder Instructions      Home Living Family/patient expects to be discharged to:: Private residence Living Arrangements: Spouse/significant other Available Help at Discharge: Available PRN/intermittently;Family Type of Home: House Home Access: Stairs to enter [employer] of Steps: 1(Step in from garage) Entrance Stairs-Rails: None Home Layout: One level     Bathroom Shower/Tub: Occupational psychologist: Handicapped height Bathroom Accessibility: Yes   Home Equipment: Environmental consultant - 2 wheels;Grab bars - tub/shower;Shower seat - built in;Cane - quad   Additional Comments: Pt's husband has "two broken knees" can can't help her at home that much. Pt reports her son is in [place], and cannot help much either. Pt's sister lives in [place] and may be able to stop in intermittently.      Prior Functioning/Environment Level of Independence: Needs assistance  Gait / Transfers Assistance Needed: use of SPC and occasionally RW ADL's / Homemaking Assistance Needed: pt reports typically is independent with ADLs, reports most recently has required assist from ADLs, receiving help from husband's cousin    Comments: Pt reports using a cane to get around in the house when she feels particularly unsteady.         OT Problem List: Decreased strength;Impaired balance (sitting and/or standing);Decreased range of motion;Decreased activity tolerance      OT Treatment/Interventions: Self-care/ADL training;DME and/or AE instruction;Therapeutic activities;Balance  training;Therapeutic  exercise;Patient/family education    OT Goals(Current goals can be found in the care plan section) Acute Rehab OT Goals Patient Stated Goal: regain her strength OT Goal Formulation: With patient Time For Goal Achievement: 02/23/18 Potential to Achieve Goals: Good  OT Frequency: Min 2X/week   Barriers to D/C:            Co-evaluation              AM-PAC PT "6 Clicks" Daily Activity     Outcome Measure Help from another person eating meals?: A Little Help from another person taking care of personal grooming?: A Little Help from another person toileting, which includes using toliet, bedpan, or urinal?: Total Help from another person bathing (including washing, rinsing, drying)?: A Lot Help from another person to put on and taking off regular upper body clothing?: A Little Help from another person to put on and taking off regular lower body clothing?: Total 6 Click Score: 13   End of Session Equipment Utilized During Treatment: Gait belt Nurse Communication: Mobility status;Other (comment)(increased dizziness )  Activity Tolerance: Patient tolerated treatment well Patient left: in chair;with call bell/phone within reach;with chair alarm set  OT Visit Diagnosis: Muscle weakness (generalized) (M62.81);Unsteadiness on feet (R26.81)                Time: 5956-3875 OT Time Calculation (min): 32 min Charges:  OT General Charges $OT Visit: 1 Visit OT Evaluation $OT Eval Moderate Complexity: 1 Mod OT Treatments $Self Care/Home Management : 8-22 mins  Lou Cal, OT Pager [phone] 02/09/2018  Raymondo Band 02/09/2018, 1:31 PM

## 2018-02-09 NOTE — Progress Notes (Signed)
Inpatient Diabetes Program Recommendations  AACE/ADA: New Consensus Statement on Inpatient Glycemic Control (2015)  Target Ranges:  Prepandial:   less than 140 mg/dL      Peak postprandial:   less than 180 mg/dL (1-2 hours)      Critically ill patients:  140 - 180 mg/dL   Lab Results  Component Value Date   GLUCAP 207 (H) 02/09/2018   HGBA1C 12.3 (H) 02/02/2018    Review of Glycemic Control Results for Belinda [name], Belinda [name] (MRN [id number]) as of 02/09/2018 12:09  Ref. Range 02/08/2018 11:10 02/08/2018 16:13 02/08/2018 20:52 02/09/2018 05:52 02/09/2018 11:16  Glucose-Capillary Latest Ref Range: 70 - 99 mg/dL 166 (H) 201 (H) 310 (H) 218 (H) 207 (H)    Inpatient Diabetes Program Recommendations:   Noted increase in Lantus to 40 units. -Add Novolog 5 units tid meal coverage if eats 50%  Thank you, Bethena Roys E. Hiro Vipond, RN, MSN, CDE  Diabetes Coordinator Inpatient Glycemic Control Team Team Pager [phone] (8am-5pm) 02/09/2018 12:19 PM

## 2018-02-09 NOTE — Consult Note (Signed)
[facility] CM Primary Care Navigator  02/09/2018  Belinda NGHIEM Nov 21, 1950 [id number]   Went to see patient at the bedside to identify possible discharge needs but staff reports that several visitors just got in to visit with patient at this time.   Will attempt to see patient at another time when she is available in the room.    Addendum (02/10/18):  Met withpatient and close friend Belinda [name]) at the bedside toidentify possible discharge needs. Permission obtained from patient to discuss health information in the presence of close friend.  Patient reportsthat she was having "recurrent falls, weakness and difficulty breathing". Patient was admitted for management of acute on chronic systolic congestive HF- Heart failure team following her.  Patient [place] [facility] [place] as herprimary care provider and endocrinologist.   [facility] [address] [place] to[facility] without difficulty so far ("states they're expensive").Patient was encouraged to notify primary care provider for any medication needs/ issues in order to get assistance if needed in the future.  Patientreports that she has been Geographical information systems officer own medications a home straight out of the containers.  Patient's close friend Belinda [name]) provides transportationto herdoctors' appointments.  Patient's husband is her primary caregiver at home and close friend will be able to assist with her care when needed.  Anticipated plan for discharge isskilled nursing facility (SNF)for rehabilitationper therapy recommendation.  Patient and friendvoiced understandingto callprimarycareprovider'soffice whenshereturns backhome,for a post discharge follow-up visitwithin1- 2 weeksor sooner if needs arise.Patient letter (with PCP's contact number) was provided asareminder.   Explained topatient regarding [facility] CM services available for  health management and resourcesat homeandhadvoiced her interest about it. Sheplans to discuss with primary care provider on her next visit about further needsin managing her health conditions when she returns home. (Congestive HF/ DM with recent A1c of 71.9 after Trulicity was discontinued due to nausea/ vomiting per patient) Patient and close friendverbalizedunderstandingto seekreferral from primary care provider to [facility] care management ifdeemed necessary and appropriatefor anyservicesin the nearfuture,once she gets backhome.   [facility] care management information was provided for futureneeds that she may have.  Primary care provider's office is listed as providing transition of care (TOC) follow-up.    For additional questions please contact:  Edwena Felty A. Jeffrie Lofstrom, BSN, RN-BC [facility] PRIMARY CARE Navigator Cell: [phone]

## 2018-02-10 ENCOUNTER — Inpatient Hospital Stay (HOSPITAL_COMMUNITY): Payer: Medicare Other

## 2018-02-10 ENCOUNTER — Encounter (HOSPITAL_COMMUNITY): Payer: Medicare Other

## 2018-02-10 DIAGNOSIS — E1165 Type 2 diabetes mellitus with hyperglycemia: Secondary | ICD-10-CM

## 2018-02-10 DIAGNOSIS — M7989 Other specified soft tissue disorders: Secondary | ICD-10-CM

## 2018-02-10 DIAGNOSIS — N184 Chronic kidney disease, stage 4 (severe): Secondary | ICD-10-CM

## 2018-02-10 LAB — BASIC METABOLIC PANEL
Anion gap: 7 (ref 5–15)
BUN: 78 mg/dL — ABNORMAL HIGH (ref 8–23)
CO2: 28 mmol/L (ref 22–32)
Calcium: 8 mg/dL — ABNORMAL LOW (ref 8.9–10.3)
Chloride: 99 mmol/L (ref 98–111)
Creatinine, Ser: 3.11 mg/dL — ABNORMAL HIGH (ref 0.44–1.00)
GFR calc Af Amer: 17 mL/min — ABNORMAL LOW (ref >60)
GFR calc non Af Amer: 15 mL/min — ABNORMAL LOW (ref >60)
Glucose, Bld: 197 mg/dL — ABNORMAL HIGH (ref 70–99)
Potassium: 3.8 mmol/L (ref 3.5–5.1)
Sodium: 134 mmol/L — ABNORMAL LOW (ref 135–145)

## 2018-02-10 LAB — GLUCOSE, CAPILLARY
Glucose-Capillary: 169 mg/dL — ABNORMAL HIGH (ref 70–99)
Glucose-Capillary: 159 mg/dL — ABNORMAL HIGH (ref 70–99)
Glucose-Capillary: 258 mg/dL — ABNORMAL HIGH (ref 70–99)
Glucose-Capillary: 315 mg/dL — ABNORMAL HIGH (ref 70–99)

## 2018-02-10 LAB — MULTIPLE MYELOMA PANEL, SERUM
Albumin SerPl Elph-Mcnc: 1.9 g/dL — ABNORMAL LOW (ref 2.9–4.4)
Albumin/Glob SerPl: 0.8 (ref 0.7–1.7)
Alpha 1: 0.3 g/dL (ref 0.0–0.4)
Alpha2 Glob SerPl Elph-Mcnc: 0.7 g/dL (ref 0.4–1.0)
B-Globulin SerPl Elph-Mcnc: 0.7 g/dL (ref 0.7–1.3)
Gamma Glob SerPl Elph-Mcnc: 0.8 g/dL (ref 0.4–1.8)
Globulin, Total: 2.5 g/dL (ref 2.2–3.9)
IgA: 227 mg/dL (ref 87–352)
IgG (Immunoglobin G), Serum: 840 mg/dL (ref 700–1600)
IgM (Immunoglobulin M), Srm: 58 mg/dL (ref 26–217)
Total Protein ELP: 4.4 g/dL — ABNORMAL LOW (ref 6.0–8.5)

## 2018-02-10 LAB — COOXEMETRY PANEL
Carboxyhemoglobin: 2.1 % — ABNORMAL HIGH (ref 0.5–1.5)
Methemoglobin: 1.1 % (ref 0.0–1.5)
O2 Saturation: 93.5 %
Total hemoglobin: 11.6 g/dL — ABNORMAL LOW (ref 12.0–16.0)

## 2018-02-10 NOTE — Care Management Note (Signed)
Case Management Note Marvetta Gibbons RN, BSN Unit 4E- RN Care Coordinator  [phone]  Patient Details  Name: Belinda [name] MRN: [id number] Date of Birth: [birth year]  Subjective/Objective:  Pt admitted with vol. Overload, lasix drip started                   Action/Plan: PTA pt lived at home, per PT eval recommendations for SNF. CSW to follow for placement needs.   Expected Discharge Date:                  Expected Discharge Plan:  Skilled Nursing Facility  In-House Referral:  Clinical Social Work  Discharge planning Services  CM Consult  Post Acute Care Choice:    Choice offered to:     DME Arranged:    DME Agency:     HH Arranged:    [place] Agency:     Status of Service:  In process, will continue to follow  If discussed at Long Length of Stay Meetings, dates discussed:    Discharge Disposition:   Additional Comments:  Dawayne Patricia, RN 02/10/2018, 11:51 AM

## 2018-02-10 NOTE — Progress Notes (Signed)
RUE venous duplex prelim: negative for DVT and SVT. Rosan Calbert Eunice, RDMS, RVT  

## 2018-02-10 NOTE — Progress Notes (Signed)
PT Cancellation Note  Patient Details Name: Belinda [name] MRN: [id number] DOB: [birth year]   Cancelled Treatment:    Reason Eval/Treat Not Completed: Pain limiting ability to participate;Fatigue/lethargy limiting ability to participate. RN notified pt requesting pain medication. Will follow-up for PT treatment.  Mabeline Caras, PT, DPT Acute Rehab Services  Pager: [place] 02/10/2018, 3:26 PM

## 2018-02-10 NOTE — Progress Notes (Addendum)
Advanced Heart Failure Rounding Note  PCP-Cardiologist: Pixie Casino, MD   Subjective:    Continues to diurese with lasix drip @ 15 mg per hour + metolazone.  Negative 3.1 liters  Weight down 9 pounds. Overall weight down 16 pounds.   Still dyspneic and bloated. No PND. Creatinine stable at 3.1   Renal u/s. Normal kidneys no hydro.   Echo (8/13) reviewed personally EF 45-50% with mild RV HK. Moderate TR. Small to moderate pericardial effusion echo texture suggestive of possible amyloid.   Myeloma Panel pending.    Objective:   Weight Range: 84.2 kg Body mass index is 36.25 kg/m.   Vital Signs:   Temp:  [97.8 F (36.6 C)-98.9 F (37.2 C)] 98.9 F (37.2 C) (08/16 0447) Pulse Rate:  [84-95] 91 (08/16 0447) Resp:  [14-20] 20 (08/16 0447) BP: (122-136)/(68-74) 126/73 (08/16 0447) SpO2:  [100 %] 100 % (08/16 0447) Weight:  [84.2 kg] 84.2 kg (08/16 0447) Last BM Date: 02/07/18  Weight change: Filed Weights   02/08/18 0630 02/09/18 0341 02/10/18 0447  Weight: 90.4 kg 88.2 kg 84.2 kg    Intake/Output:   Intake/Output Summary (Last 24 hours) at 02/10/2018 0844 Last data filed at 02/10/2018 0449 Gross per 24 hour  Intake 923.41 ml  Output 3200 ml  Net -2276.59 ml      Physical Exam  CVP 9-10 personally checked.  General:  Elderly woman lying in bed. No resp difficulty HEENT: normal anicteric  Neck: supple. JVP 9-10 . Carotids 2+ bilat; no bruits. No lymphadenopathy or thryomegaly appreciated. Cor: PMI nondisplaced. Regular rate & rhythm. No rubs, gallops or murmurs. Lungs: clear anteriorly no wheeze Abdomen: obese soft, nontender, nondistended. No hepatosplenomegaly. No bruits or masses. Good bowel sounds. Extremities: no cyanosis, clubbing, rash, R and LLE 3+ edema with ted hose. RUE PICC Neuro: alert & oriented x 3, cranial nerves grossly intact. moves all 4 extremities w/o difficulty. Affect pleasant   Telemetry   NSR 90s low volts. personally  reviewed.   EKG    N/A   Labs    CBC No results for input(s): WBC, NEUTROABS, HGB, HCT, MCV, PLT in the last 72 hours. Basic Metabolic Panel Recent Labs    02/08/18 0500 02/09/18 0400 02/10/18 0425  NA 134* 134* 134*  K 4.3 4.0 3.8  CL 101 99 99  CO2 24 26 28   GLUCOSE 307* 316* 197*  BUN 71* 72* 78*  CREATININE 3.10* 3.08* 3.11*  CALCIUM 7.7* 7.9* 8.0*  MG 1.6* 1.9  --    Liver Function Tests No results for input(s): AST, ALT, ALKPHOS, BILITOT, PROT, ALBUMIN in the last 72 hours. No results for input(s): LIPASE, AMYLASE in the last 72 hours. Cardiac Enzymes No results for input(s): CKTOTAL, CKMB, CKMBINDEX, TROPONINI in the last 72 hours.  BNP: BNP (last 3 results) Recent Labs    02/02/18 1501  BNP >4,500.0*    ProBNP (last 3 results) Recent Labs    04/26/17 1246  PROBNP 4,620*     D-Dimer No results for input(s): DDIMER in the last 72 hours. Hemoglobin A1C No results for input(s): HGBA1C in the last 72 hours. Fasting Lipid Panel No results for input(s): CHOL, HDL, LDLCALC, TRIG, CHOLHDL, LDLDIRECT in the last 72 hours. Thyroid Function Tests No results for input(s): TSH, T4TOTAL, T3FREE, THYROIDAB in the last 72 hours.  Invalid input(s): FREET3  Other results:   Imaging    No results found.   Medications:     Scheduled  Medications: . ezetimibe-simvastatin  1 tablet Oral Daily  . fluticasone  2 spray Each Nare QHS  . guaiFENesin  1,200 mg Oral BID  . heparin  5,000 Units Subcutaneous Q8H  . insulin aspart  0-15 Units Subcutaneous TID WC  . insulin glargine  40 Units Subcutaneous QHS  . loratadine  10 mg Oral Daily  . metolazone  2.5 mg Oral BID  . nitroGLYCERIN  0.4 mg Sublingual UD  . sodium chloride flush  10-40 mL Intracatheter Q12H    Infusions: . sodium chloride Stopped (02/03/18 0749)  . dextrose 5 % and 0.45% NaCl Stopped (02/08/18 1147)  . 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 Debara Pickett for ADHF and massive volume overload.   Assessment/Plan  1. Acute on chronic systolic HF with massive volume overload - EF 2016 was 70% - Developed new-onset HF in 10/18. EF 25-30% in 11/18 RV ok (echo reviewed personally) - Etiology likely ischemic vs hypertensive. OSA and infiltrative processes also in the DDx.  -Admitted with massive volume overload and cardiorenal syndrome responding poorly to IV lasix with rising creatinine.  02/07/2018 -->ECHO EF 45-50% Grade IDD RV mildly dilated. Peak PA pressure 43 mm hg.  - CO-OX stable.  - Brisk diuresis noted. Continue lasix drip 15 mg per hour + metolazone 2.5 mg twice a day.  - Renal function ok. Follow daily.  - Off BB.  - No ACE/ARB/ARNI/spiro with A/CKD - Will need further work-up for ischemic heart disease as creatinine permits (ideally cath but doubt creatinine will improve that much) - PYP scan prior to discharge.   2. A/CKD - baseline creatinine ~2.0-2.5. Followed by Dr Hollie Salk. - Creatinine trending up 2.9>3.14->3.1 ->3.08 ->3.1 -BMEt daily. .  - suspect cardiorenal syndrome - hold all nephrotoxic agents - support cardiac output as needed  3. Probable OSA - will need sleep study as outpatient  4. HTN - Has h/o severe HTN. BP ok today.   5.DM2 - Poorly controlled   6. Severe Deconditioning PT/OT recommending SNF.    Medication concerns reviewed with patient and pharmacy team. Barriers identified: n/a   Length of Stay: 8  Amy Clegg, NP  02/10/2018, 8:44 AM  Advanced Heart Failure Team Pager [phone] (M-F; 7a -  4p)  Please contact [facility] for night-coverage after hours (4p -7a ) and weekends on [email or web address]  Patient seen and examined with Darrick Grinder, NP. We discussed all aspects of the encounter. I agree with the assessment and plan as stated above.   Volumes status improving with high dose lasix and metolazone but still markedly volume overloaded. Co-ox ok. Creatinine remains elevated but is stable at 3.1. Wil continue IV diuresis. Suspicion for cardiac amyloid remains high. Myeloma panel pending. PYP ordered for Monday.  Glori Bickers, MD  3:48 PM  Addendum: SPEP negative.   Glori Bickers, MD  3:49 PM

## 2018-02-10 NOTE — Progress Notes (Signed)
PROGRESS NOTE    Belinda [name]  JSE:[id number] DOB: Feb 04, 1951 DOA: 02/02/2018 PCP: Reynold Bowen, MD   Brief Narrative: Patient is a 67 year old female with past medical history of systolic heart failure, diabetes type 2, chronic kidney disease stage III-IV who presented to the emergency department with complaints of increased shortness of breath for last few days.  Denies any chest pain.  Reportedly she had not taken her Demadex at home.  Patient was admitted for the management of acute on chronic systolic CHF.  Heart failure team following.  Assessment & Plan:   Active Problems:   Acute systolic (congestive) heart failure (HCC)   Essential hypertension   Acute CHF (congestive heart failure) (HCC)   DM (diabetes mellitus), type 2 with renal complications (HCC)   Pressure injury of skin   Hyperglycemia   Renal failure (ARF), acute on chronic (HCC)  Acute on chronic systolic CHF: Echocardiogram showed reduced systolic function with ejection fraction of 45 to 40%.  Mild diffuse hypokinesis, grade 1 diastolic dysfunction.  Heart failure team following.  Currently on Lasix drip and metalazone.  [name] is currently on hold due to AKI and CKD.  We will continue to monitor BMP. Cardiology wants to rule out amyloidosis.  Myeloma panel sent. She continues to have excellent diuresis .  Peripheral edema seems to be improving.  There is significant decrease in the edema of her left upper extremity.  Right upper extremity edema: Most likely secondary to adjacent IV line.  Will rule out DVT by doing a duplex study.  Hypertension: Currently blood pressure stable.  We will continue to monitor.  Coreg discontinued.  AKI on  CKD stage IV: Most likely secondary to cardiorenal syndrome.  Continue diuresis.  Entresto on hold.  Avoid nephrotoxic agents.Creatinine today has plateaued around 3 and this could be her new baseline.  Hypokalemia/hypomagnesemia: Supplemented.  Obstructive sleep apnea:  Sleep study as an outpatient.  Diabetes type 2: Increased the dose of Lantus.  Continue current regimen.  Deconditioning/debility: Physical therapy evaluated and recommended SNF.SW consulted.  DVT prophylaxis: Heparin [place] Code Status: Full Family Communication: None present at the bedside Disposition Plan: Likely SNF after stabilization of heart failure   Consultants: Heart failure  Procedures: None  Antimicrobials: None  Subjective: Patient seen and examined the bedside this morning.  Looks better today.  Feels better.  Shortness of breath has improved .Peripheral edema has improved.  Objective: Vitals:   02/09/18 1612 02/09/18 1946 02/09/18 2359 02/10/18 0447  BP: 127/68 136/74 122/69 126/73  Pulse: 86 95 88 91  Resp: 16 18 16 20   Temp: 98.3 F (36.8 C) 98.5 F (36.9 C) 98.4 F (36.9 C) 98.9 F (37.2 C)  TempSrc: Oral Oral Oral Oral  SpO2: 100% 100% 100% 100%  Weight:    84.2 kg  Height:        Intake/Output Summary (Last 24 hours) at 02/10/2018 1252 Last data filed at 02/10/2018 0449 Gross per 24 hour  Intake 923.41 ml  Output 2350 ml  Net -1426.59 ml   Filed Weights   02/08/18 0630 02/09/18 0341 02/10/18 0447  Weight: 90.4 kg 88.2 kg 84.2 kg    Examination:  General exam:Not in distress,obese HEENT:PERRL,Oral mucosa moist, Ear/Nose normal on gross exam Respiratory system: Bilateral decreased air entry on the bases,crackles on bases Cardiovascular system: S1 & S2 heard, RRR. No JVD, murmurs, rubs, gallops or clicks.   Gastrointestinal system: Abdomen is nondistended, soft and nontender. No organomegaly or masses felt. Normal bowel sounds  heard. Central nervous system: Alert and oriented. No focal neurological deficits. Extremities: Severe edema of the right upper extremity,trace  peripheral edema elsewhere, no clubbing ,no cyanosis, distal peripheral pulses palpable. Skin: No rashes, lesions or ulcers,no icterus ,no pallor MSK: Normal muscle bulk,tone  ,power Psychiatry: Judgement and insight appear normal. Mood & affect appropriate.     Data Reviewed: I have personally reviewed following labs and imaging studies  CBC: No results for input(s): WBC, NEUTROABS, HGB, HCT, MCV, PLT in the last 168 hours. Basic Metabolic Panel: Recent Labs  Lab 02/06/18 0233 02/07/18 0408 02/08/18 0500 02/09/18 0400 02/10/18 0425  NA 135 135 134* 134* 134*  K 4.6 4.6 4.3 4.0 3.8  CL 104 103 101 99 99  CO2 22 22 24 26 28   GLUCOSE 202* 342* 307* 316* 197*  BUN 61* 65* 71* 72* 78*  CREATININE 2.95* 3.14* 3.10* 3.08* 3.11*  CALCIUM 7.6* 7.6* 7.7* 7.9* 8.0*  MG  --   --  1.6* 1.9  --    GFR: Estimated Creatinine Clearance: 17.1 mL/min (A) (by C-G formula based on SCr of 3.11 mg/dL (H)). Liver Function Tests: No results for input(s): AST, ALT, ALKPHOS, BILITOT, PROT, ALBUMIN in the last 168 hours. No results for input(s): LIPASE, AMYLASE in the last 168 hours. No results for input(s): AMMONIA in the last 168 hours. Coagulation Profile: No results for input(s): INR, PROTIME in the last 168 hours. Cardiac Enzymes: No results for input(s): CKTOTAL, CKMB, CKMBINDEX, TROPONINI in the last 168 hours. BNP (last 3 results) Recent Labs    04/26/17 1246  PROBNP 4,620*   HbA1C: No results for input(s): HGBA1C in the last 72 hours. CBG: Recent Labs  Lab 02/09/18 1116 02/09/18 1609 02/09/18 2059 02/10/18 0604 02/10/18 1115  GLUCAP 207* 203* 247* 169* 159*   Lipid Profile: No results for input(s): CHOL, HDL, LDLCALC, TRIG, CHOLHDL, LDLDIRECT in the last 72 hours. Thyroid Function Tests: No results for input(s): TSH, T4TOTAL, FREET4, T3FREE, THYROIDAB in the last 72 hours. Anemia Panel: No results for input(s): VITAMINB12, FOLATE, FERRITIN, TIBC, IRON, RETICCTPCT in the last 72 hours. Sepsis Labs: No results for input(s): PROCALCITON, LATICACIDVEN in the last 168 hours.  Recent Results (from the past 240 hour(s))  MRSA PCR Screening      Status: Abnormal   Collection Time: 02/03/18 12:21 AM  Result Value Ref Range Status   MRSA by PCR POSITIVE (A) NEGATIVE Final    Comment:        The GeneXpert MRSA Assay (FDA approved for NASAL specimens only), is one component of a comprehensive MRSA colonization surveillance program. It is not intended to diagnose MRSA infection nor to guide or monitor treatment for MRSA infections. RESULT CALLED TO, READ BACK BY AND VERIFIED WITHDoristine Johns RN [id number] 02/03/18 A BROWNING Performed at [facility], [place] [address], [place], [place] [address]          Radiology Studies: No results found.      Scheduled Meds: . ezetimibe-simvastatin  1 tablet Oral Daily  . fluticasone  2 spray Each Nare QHS  . guaiFENesin  1,200 mg Oral BID  . heparin  5,000 Units Subcutaneous Q8H  . insulin aspart  0-15 Units Subcutaneous TID WC  . insulin glargine  40 Units Subcutaneous QHS  . loratadine  10 mg Oral Daily  . metolazone  2.5 mg Oral BID  . nitroGLYCERIN  0.4 mg Sublingual UD  . sodium chloride flush  10-40 mL Intracatheter Q12H  Continuous Infusions: . sodium chloride Stopped (02/03/18 0749)  . dextrose 5 % and 0.45% NaCl Stopped (02/08/18 1147)  . furosemide (LASIX) infusion 15 mg/hr (02/10/18 1042)     LOS: 8 days    Time spent: 25 mins.More than 50% of that time was spent in counseling and/or coordination of care.      Shelly Coss, MD [facility] Pager [phone]  If 7PM-7AM, please contact night-coverage [email or web address] Password [facility] 02/10/2018, 12:52 PM

## 2018-02-11 LAB — GLUCOSE, CAPILLARY
Glucose-Capillary: 221 mg/dL — ABNORMAL HIGH (ref 70–99)
Glucose-Capillary: 164 mg/dL — ABNORMAL HIGH (ref 70–99)
Glucose-Capillary: 206 mg/dL — ABNORMAL HIGH (ref 70–99)
Glucose-Capillary: 191 mg/dL — ABNORMAL HIGH (ref 70–99)

## 2018-02-11 LAB — BASIC METABOLIC PANEL
Anion gap: 10 (ref 5–15)
BUN: 73 mg/dL — ABNORMAL HIGH (ref 8–23)
CO2: 28 mmol/L (ref 22–32)
Calcium: 8.1 mg/dL — ABNORMAL LOW (ref 8.9–10.3)
Chloride: 97 mmol/L — ABNORMAL LOW (ref 98–111)
Creatinine, Ser: 2.82 mg/dL — ABNORMAL HIGH (ref 0.44–1.00)
GFR calc Af Amer: 19 mL/min — ABNORMAL LOW (ref >60)
GFR calc non Af Amer: 16 mL/min — ABNORMAL LOW (ref >60)
Glucose, Bld: 186 mg/dL — ABNORMAL HIGH (ref 70–99)
Potassium: 3.4 mmol/L — ABNORMAL LOW (ref 3.5–5.1)
Sodium: 135 mmol/L (ref 135–145)

## 2018-02-11 LAB — URINALYSIS, ROUTINE W REFLEX MICROSCOPIC
Bilirubin Urine: NEGATIVE
Glucose, UA: NEGATIVE mg/dL
Ketones, ur: NEGATIVE mg/dL
Nitrite: NEGATIVE
Protein, ur: NEGATIVE mg/dL
RBC / HPF: >50 RBC/hpf — ABNORMAL HIGH (ref 0–5)
Specific Gravity, Urine: 1.005 (ref 1.005–1.030)
WBC, UA: >50 WBC/hpf — ABNORMAL HIGH (ref 0–5)
pH: 5 (ref 5.0–8.0)

## 2018-02-11 LAB — COOXEMETRY PANEL
Carboxyhemoglobin: 2 % — ABNORMAL HIGH (ref 0.5–1.5)
Methemoglobin: 1.4 % (ref 0.0–1.5)
O2 Saturation: 79.7 %
Total hemoglobin: 10.6 g/dL — ABNORMAL LOW (ref 12.0–16.0)

## 2018-02-11 MED ORDER — ISOSORBIDE MONONITRATE ER 30 MG PO TB24
15.0000 mg | ORAL_TABLET | Freq: Every day | ORAL | Status: DC
  Administered 2018-02-11 – 2018-02-16 (×6): 15 mg via ORAL
  Filled 2018-02-11 (×6): qty 1

## 2018-02-11 MED ORDER — HYDRALAZINE HCL 25 MG PO TABS
12.5000 mg | ORAL_TABLET | Freq: Three times a day (TID) | ORAL | Status: DC
  Administered 2018-02-11 – 2018-02-16 (×16): 12.5 mg via ORAL
  Filled 2018-02-11 (×16): qty 1

## 2018-02-11 NOTE — Progress Notes (Signed)
PROGRESS NOTE    Belinda [name]  NOB:[id number] DOB: 1950-10-14 DOA: 02/02/2018 PCP: Reynold Bowen, MD   Brief Narrative: Patient is a 67 year old female with past medical history of systolic heart failure, diabetes type 2, chronic kidney disease stage III-IV who presented to the emergency department with complaints of increased shortness of breath for last few days.  Denies any chest pain.  Reportedly she had not taken her Demadex at home.  Patient was admitted for the management of acute on chronic systolic CHF.  Heart failure team following.  Assessment & Plan:   Active Problems:   Acute systolic (congestive) heart failure (HCC)   Essential hypertension   Acute CHF (congestive heart failure) (HCC)   DM (diabetes mellitus), type 2 with renal complications (HCC)   Pressure injury of skin   Hyperglycemia   Renal failure (ARF), acute on chronic (HCC)  Acute on chronic systolic CHF: Echocardiogram showed reduced systolic function with ejection fraction of 45 to 40%.  Mild diffuse hypokinesis, grade 1 diastolic dysfunction.  Heart failure team following.  Currently on Lasix drip and metalazone.  [name] is currently on hold due to AKI and CKD.  We will continue to monitor BMP. Cardiology wants to rule out amyloidosis.  Myeloma panel sent but came out to be negative. She continues to have excellent diuresis .  Peripheral edema seems to be improving.  There is significant decrease in the edema of her  upper extremities.  Right upper extremity edema: Most likely secondary to adjacent IV line. duplex study did not show DVT or SVT.  Hypertension: Currently blood pressure stable.  We will continue to monitor.  Coreg discontinued.  AKI on  CKD stage IV: Most likely secondary to cardiorenal syndrome.  Continue diuresis.  Entresto on hold.  Avoid nephrotoxic agents.Creatinine today has plateaued around 3 and this could be her new baseline.  Hypokalemia/hypomagnesemia:  Supplemented.  Obstructive sleep apnea: Sleep study as an outpatient.  Diabetes type 2: Increased the dose of Lantus.  Continue current regimen.  Deconditioning/debility: Physical therapy evaluated and recommended SNF.SW consulted.  DVT prophylaxis: Heparin [place] Code Status: Full Family Communication: None present at the bedside Disposition Plan: Likely SNF after stabilization of heart failure   Consultants: Heart failure  Procedures: None  Antimicrobials: None  Subjective: Patient seen and examined the bedside this morning.  No active issues.  Peripheral edema continues to improve.  Continues to have excellent diuresis.  Objective: Vitals:   02/11/18 0423 02/11/18 0749 02/11/18 0958 02/11/18 1136  BP: 139/83 139/79 129/76   Pulse: 93 91    Resp: 16 16    Temp: 98.3 F (36.8 C)  97.6 F (36.4 C)   TempSrc: Oral Oral Oral   SpO2: 100% 100%    Weight: 90.7 kg   81.5 kg  Height:        Intake/Output Summary (Last 24 hours) at 02/11/2018 1419 Last data filed at 02/11/2018 1030 Gross per 24 hour  Intake 480 ml  Output 5751 ml  Net -5271 ml   Filed Weights   02/10/18 0447 02/11/18 0423 02/11/18 1136  Weight: 84.2 kg 90.7 kg 81.5 kg    Examination:  General exam:Not in distress,obese HEENT:PERRL,Oral mucosa moist, Ear/Nose normal on gross exam Respiratory system: Bilateral decreased air entry on the bases,crackles on bases Cardiovascular system: S1 & S2 heard, RRR. No JVD, murmurs, rubs, gallops or clicks.   Gastrointestinal system: Abdomen is nondistended, soft and nontender. No organomegaly or masses felt. Normal bowel sounds heard. Central  nervous system: Alert and oriented. No focal neurological deficits. Extremities: Peripheral edema , no clubbing ,no cyanosis, distal peripheral pulses palpable. Skin: No rashes, lesions or ulcers,no icterus ,no pallor MSK: Normal muscle bulk,tone ,power Psychiatry: Judgement and insight appear normal. Mood & affect appropriate.      Data Reviewed: I have personally reviewed following labs and imaging studies  CBC: No results for input(s): WBC, NEUTROABS, HGB, HCT, MCV, PLT in the last 168 hours. Basic Metabolic Panel: Recent Labs  Lab 02/06/18 0233 02/07/18 0408 02/08/18 0500 02/09/18 0400 02/10/18 0425  NA 135 135 134* 134* 134*  K 4.6 4.6 4.3 4.0 3.8  CL 104 103 101 99 99  CO2 22 22 24 26 28   GLUCOSE 202* 342* 307* 316* 197*  BUN 61* 65* 71* 72* 78*  CREATININE 2.95* 3.14* 3.10* 3.08* 3.11*  CALCIUM 7.6* 7.6* 7.7* 7.9* 8.0*  MG  --   --  1.6* 1.9  --    GFR: Estimated Creatinine Clearance: 16.8 mL/min (A) (by C-G formula based on SCr of 3.11 mg/dL (H)). Liver Function Tests: No results for input(s): AST, ALT, ALKPHOS, BILITOT, PROT, ALBUMIN in the last 168 hours. No results for input(s): LIPASE, AMYLASE in the last 168 hours. No results for input(s): AMMONIA in the last 168 hours. Coagulation Profile: No results for input(s): INR, PROTIME in the last 168 hours. Cardiac Enzymes: No results for input(s): CKTOTAL, CKMB, CKMBINDEX, TROPONINI in the last 168 hours. BNP (last 3 results) Recent Labs    04/26/17 1246  PROBNP 4,620*   HbA1C: No results for input(s): HGBA1C in the last 72 hours. CBG: Recent Labs  Lab 02/10/18 1115 02/10/18 1633 02/10/18 2228 02/11/18 0627 02/11/18 1117  GLUCAP 159* 258* 315* 221* 164*   Lipid Profile: No results for input(s): CHOL, HDL, LDLCALC, TRIG, CHOLHDL, LDLDIRECT in the last 72 hours. Thyroid Function Tests: No results for input(s): TSH, T4TOTAL, FREET4, T3FREE, THYROIDAB in the last 72 hours. Anemia Panel: No results for input(s): VITAMINB12, FOLATE, FERRITIN, TIBC, IRON, RETICCTPCT in the last 72 hours. Sepsis Labs: No results for input(s): PROCALCITON, LATICACIDVEN in the last 168 hours.  Recent Results (from the past 240 hour(s))  MRSA PCR Screening     Status: Abnormal   Collection Time: 02/03/18 12:21 AM  Result Value Ref Range Status    MRSA by PCR POSITIVE (A) NEGATIVE Final    Comment:        The GeneXpert MRSA Assay (FDA approved for NASAL specimens only), is one component of a comprehensive MRSA colonization surveillance program. It is not intended to diagnose MRSA infection nor to guide or monitor treatment for MRSA infections. RESULT CALLED TO, READ BACK BY AND VERIFIED WITHDoristine Johns RN 2025 02/03/18 A BROWNING Performed at [facility], [place] [address] [place], [place] [address]          Radiology Studies: No results found.      Scheduled Meds: . ezetimibe-simvastatin  1 tablet Oral Daily  . fluticasone  2 spray Each Nare QHS  . guaiFENesin  1,200 mg Oral BID  . heparin  5,000 Units Subcutaneous Q8H  . hydrALAZINE  12.5 mg Oral Q8H  . insulin aspart  0-15 Units Subcutaneous TID WC  . insulin glargine  40 Units Subcutaneous QHS  . isosorbide mononitrate  15 mg Oral Daily  . loratadine  10 mg Oral Daily  . metolazone  2.5 mg Oral BID  . nitroGLYCERIN  0.4 mg Sublingual UD  . sodium chloride  flush  10-40 mL Intracatheter Q12H   Continuous Infusions: . sodium chloride Stopped (02/03/18 0749)  . dextrose 5 % and 0.45% NaCl Stopped (02/08/18 1147)  . furosemide (LASIX) infusion 15 mg/hr (02/10/18 1042)     LOS: 9 days    Time spent: 25 mins.More than 50% of that time was spent in counseling and/or coordination of care.      Shelly Coss, MD [facility] Pager [phone]  If 7PM-7AM, please contact night-coverage [email or web address] Password [email or web address] 02/11/2018, 2:19 PM

## 2018-02-11 NOTE — NC FL2 (Signed)
[place] LEVEL OF CARE SCREENING TOOL     IDENTIFICATION  Patient Name: Belinda [name] Birthdate: [birth year] Sex: female Admission Date (Current Location): 02/02/2018  [facility] and [place] Number:  Herbalist and Address:  [facility] [place], [place] [address], [place], [place] [address]      Provider Number: [id number]  Attending Physician Name and Address:  Shelly Coss, MD  Relative Name and Phone Number:  Anneta Rounds, spouse, [phone]    Current Level of Care: Hospital Recommended Level of Care: [place] Prior Approval Number:    Date Approved/Denied:   PASRR Number: [id number]  Discharge Plan: SNF    Current Diagnoses: Patient Active Problem List   Diagnosis Date Noted  . Renal failure (ARF), acute on chronic (HCC)   . Hyperglycemia   . Pressure injury of skin 02/04/2018  . Acute CHF (congestive heart failure) ([place]) 02/02/2018  . DM (diabetes mellitus), type 2 with renal complications ([place]) 20/94/7096  . Bilateral lower extremity edema 11/25/2017  . Chronic systolic (congestive) heart failure ([place]) 08/30/2017  . Acute systolic (congestive) heart failure ([place]) 05/30/2017  . Essential hypertension 05/30/2017  . Congestive heart failure ([place]) 04/26/2017  . Chronic kidney disease 04/26/2017  . DJD (degenerative joint disease) of knee 02/12/2015  . Frequent PVCs 11/12/2014  . Cervical spondylosis with myelopathy and radiculopathy 01/02/2014  . Cervical spondylosis with myelopathy 01/01/2014  . Wound check, abscess 06/14/2013  . Cellulitis and abscess of buttock - right 06/12/2013  . Dyspnea on exertion 03/22/2011  . Chest pain 03/22/2011  . Diabetes mellitus   . Back pain   . Palpitations   . Anemia   . Hx MRSA infection   . Osteoarthritis     Orientation RESPIRATION BLADDER Height & Weight     Self, Time, Situation, Place  O2(2L nasal canula) Indwelling catheter, Incontinent Weight: 199  lb 15.3 oz (90.7 kg) Height:  5' (152.4 cm)  BEHAVIORAL SYMPTOMS/MOOD NEUROLOGICAL BOWEL NUTRITION STATUS      Continent Diet(see discharge summary)  AMBULATORY STATUS COMMUNICATION OF NEEDS Skin   Extensive Assist Verbally PU Stage and Appropriate Care   PU Stage 2 Dressing: (on thigh with hydrocolloid dressing PRN changes)                   Personal Care Assistance Level of Assistance  Bathing, Feeding, Dressing Bathing Assistance: Maximum assistance Feeding assistance: Independent Dressing Assistance: Maximum assistance     Functional Limitations Info  Speech, Hearing, Sight Sight Info: Adequate(wears glasses) Hearing Info: Adequate Speech Info: Adequate    SPECIAL CARE FACTORS FREQUENCY  PT (By licensed PT), OT (By licensed OT)     PT Frequency: 5x week OT Frequency: 5x week            Contractures Contractures Info: Not present    Additional Factors Info  Code Status, Allergies, Isolation Precautions, Insulin Sliding Scale Code Status Info: Full Code Allergies Info: OMNICEF CEFDINIR, ASPIRIN, AUGMENTIN AMOXICILLIN-POT CLAVULANATE, ERYTHROMYCIN, GABAPENTIN, NICKEL    Insulin Sliding Scale Info: insulin aspart (novoLOG) injection 0-15 Units 3x daily with meals; insulin glargine (LANTUS) injection 40 Units daily at bedtime Isolation Precautions Info: MRSA     Current Medications (02/11/2018):  This is the current hospital active medication list Current Facility-Administered Medications  Medication Dose Route Frequency Provider Last Rate Last Dose  . 0.9 %  sodium chloride infusion   Intravenous Continuous Rise Patience, MD   Stopped at 02/03/18 [phone]  .  acetaminophen (TYLENOL) tablet 650 mg  650 mg Oral Q6H PRN Rise Patience, MD   650 mg at 02/11/18 0009   Or  . acetaminophen (TYLENOL) suppository 650 mg  650 mg Rectal Q6H PRN Rise Patience, MD      . dextrose 5 %-0.45 % sodium chloride infusion   Intravenous Continuous Oswald Hillock, MD    Stopped at 02/08/18 1147  . dextrose 50 % solution 25 mL  25 mL Intravenous PRN Rise Patience, MD   25 mL at 02/04/18 0819  . ezetimibe-simvastatin (VYTORIN) 10-40 MG per tablet 1 tablet  1 tablet Oral Daily Rise Patience, MD   1 tablet at 02/10/18 1042  . fluticasone (FLONASE) 50 MCG/ACT nasal spray 2 spray  2 spray Each Nare QHS Gardiner Barefoot, NP   2 spray at 02/11/18 0001  . furosemide (LASIX) 250 mg in dextrose 5 % 250 mL (1 mg/mL) infusion  15 mg/hr Intravenous Continuous Clegg, Amy D, NP 15 mL/hr at 02/10/18 1042 15 mg/hr at 02/10/18 1042  . guaiFENesin (MUCINEX) 12 hr tablet 1,200 mg  1,200 mg Oral BID Ledora Bottcher, PA   1,200 mg at 02/11/18 0003  . heparin injection 5,000 Units  5,000 Units Subcutaneous Q8H Rise Patience, MD   5,000 Units at 02/11/18 0701  . insulin aspart (novoLOG) injection 0-15 Units  0-15 Units Subcutaneous TID WC Oswald Hillock, MD   5 Units at 02/11/18 0701  . insulin glargine (LANTUS) injection 40 Units  40 Units Subcutaneous QHS Shelly Coss, MD   40 Units at 02/11/18 0019  . loratadine (CLARITIN) tablet 10 mg  10 mg Oral Daily Ledora Bottcher, [place]   10 mg at 02/10/18 1042  . metolazone (ZAROXOLYN) tablet 2.5 mg  2.5 mg Oral BID Shirley Friar, PA-C   2.5 mg at 02/10/18 1717  . nitroGLYCERIN (NITROSTAT) SL tablet 0.4 mg  0.4 mg Sublingual UD Rise Patience, MD      . ondansetron [facility]) tablet 4 mg  4 mg Oral Q6H PRN Rise Patience, MD       Or  . ondansetron [facility]) injection 4 mg  4 mg Intravenous Q6H PRN Rise Patience, MD      . sodium chloride flush (NS) 0.9 % injection 10-40 mL  10-40 mL Intracatheter Q12H Oswald Hillock, MD   10 mL at 02/10/18 1044  . sodium chloride flush (NS) 0.9 % injection 10-40 mL  10-40 mL Intracatheter PRN Oswald Hillock, MD         Discharge Medications: Please see discharge summary for a list of discharge medications.  Relevant Imaging Results:  Relevant Lab  Results:   Additional Information [id number] [place], [place]

## 2018-02-11 NOTE — Progress Notes (Signed)
Advanced Heart Failure Rounding Note  PCP-Cardiologist: Pixie Casino, MD   Subjective:    Continues to diurese with lasix drip @ 15 mg per hour + metolazone.  Negative 3.1 L but weight recorded as up 14 pounds.     Still mildly SOB. No orthopnea or PND. Feels bloated.   Co-ox 80% No BMET this am.   Renal u/s. Normal kidneys no hydro.   Echo (8/13) reviewed personally EF 45-50% with mild RV HK. Moderate TR. Small to moderate pericardial effusion echo texture suggestive of possible amyloid.   Myeloma Panel negative.    Objective:   Weight Range: 90.7 kg Body mass index is 39.05 kg/m.   Vital Signs:   Temp:  [97.6 F (36.4 C)-98.7 F (37.1 C)] 97.6 F (36.4 C) (08/17 0958) Pulse Rate:  [91-98] 91 (08/17 0749) Resp:  [12-19] 16 (08/17 0749) BP: (129-157)/(71-83) 129/76 (08/17 0958) SpO2:  [100 %] 100 % (08/17 0749) Weight:  [90.7 kg] 90.7 kg (08/17 0423) Last BM Date: 02/10/18  Weight change: Filed Weights   02/09/18 0341 02/10/18 0447 02/11/18 0423  Weight: 88.2 kg 84.2 kg 90.7 kg    Intake/Output:   Intake/Output Summary (Last 24 hours) at 02/11/2018 1121 Last data filed at 02/11/2018 1030 Gross per 24 hour  Intake 720 ml  Output 5751 ml  Net -5031 ml      Physical Exam   General:  Elderly woman lying in bed, NAD HEENT: normal Neck: supple. JVP to jaw  Carotids 2+ bilat; no bruits. No lymphadenopathy or thryomegaly appreciated. Cor: PMI nondisplaced. Regular rate & rhythm. 2/6 TR Lungs: clear Abdomen: soft, nontender, nondistended. No hepatosplenomegaly. No bruits or masses. Good bowel sounds. Extremities: no cyanosis, clubbing, rash, 2+ edema Neuro: alert & orientedx3, cranial nerves grossly intact. moves all 4 extremities w/o difficulty. Affect pleasant   Telemetry   NSR 90s low volts. personally reviewed.    Labs    CBC No results for input(s): WBC, NEUTROABS, HGB, HCT, MCV, PLT in the last 72 hours. Basic Metabolic Panel Recent Labs     02/09/18 0400 02/10/18 0425  NA 134* 134*  K 4.0 3.8  CL 99 99  CO2 26 28  GLUCOSE 316* 197*  BUN 72* 78*  CREATININE 3.08* 3.11*  CALCIUM 7.9* 8.0*  MG 1.9  --    Liver Function Tests No results for input(s): AST, ALT, ALKPHOS, BILITOT, PROT, ALBUMIN in the last 72 hours. No results for input(s): LIPASE, AMYLASE in the last 72 hours. Cardiac Enzymes No results for input(s): CKTOTAL, CKMB, CKMBINDEX, TROPONINI in the last 72 hours.  BNP: BNP (last 3 results) Recent Labs    02/02/18 1501  BNP >4,500.0*    ProBNP (last 3 results) Recent Labs    04/26/17 1246  PROBNP 4,620*     D-Dimer No results for input(s): DDIMER in the last 72 hours. Hemoglobin A1C No results for input(s): HGBA1C in the last 72 hours. Fasting Lipid Panel No results for input(s): CHOL, HDL, LDLCALC, TRIG, CHOLHDL, LDLDIRECT in the last 72 hours. Thyroid Function Tests No results for input(s): TSH, T4TOTAL, T3FREE, THYROIDAB in the last 72 hours.  Invalid input(s): FREET3  Other results:   Imaging    No results found.   Medications:     Scheduled Medications: . ezetimibe-simvastatin  1 tablet Oral Daily  . fluticasone  2 spray Each Nare QHS  . guaiFENesin  1,200 mg Oral BID  . heparin  5,000 Units Subcutaneous Q8H  .  insulin aspart  0-15 Units Subcutaneous TID WC  . insulin glargine  40 Units Subcutaneous QHS  . loratadine  10 mg Oral Daily  . metolazone  2.5 mg Oral BID  . nitroGLYCERIN  0.4 mg Sublingual UD  . sodium chloride flush  10-40 mL Intracatheter Q12H    Infusions: . sodium chloride Stopped (02/03/18 0749)  . dextrose 5 % and 0.45% NaCl Stopped (02/08/18 1147)  . 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 Debara Pickett for ADHF and massive volume overload.   Assessment/Plan   1. Acute on chronic systolic HF with massive volume overload - Admitted with massive volume overload and cardiorenal syndrome responding poorly to IV lasix with rising creatinine.  02/07/2018  - EF 2016 was 70% - ECHO 11/18 EF 45-50% Grade IDD RV mildly dilated. Peak PA pressure 43 mm hg.  - ECHO 02/07/18 EF 25-30% - Etiology unclear. Echo suggestive of cardiac amyloid. Ischemia and hypertensive CM also possible. Unable to cath with A/CKD.  - CO-OX ok - Continue with brisk diuresis noted. Continue lasix drip 15 mg per hour + metolazone 2.5 mg twice a day. Weight incorrect. Have asked for repeat weight. Needs BMET today (ordered) - Continue to follow creatinine closely with diuresis. Check UA - Off BB with ADHF - No ACE/ARB/ARNI/spiro with A/CKD - Will need hydral/nitrates - Myeloma panel negative. PYP scan on Monday to check for TTR amyloid .   2. A/CKD - baseline creatinine ~2.0-2.5. Followed by Dr Hollie Salk. - Creatinine trending up 2.9>3.14->3.1 ->3.08 ->3.1->  - BMET dail (now ordered for today) - suspect cardiorenal syndrome - hold all nephrotoxic agents - support cardiac output as needed  3. Probable OSA - will need sleep study as outpatient  4. HTN - Has h/o severe HTN. BP ok today.  - Add hydral/nitrates for CHF  5.DM2 - Poorly controlled   6. Severe Deconditioning PT/OT recommending SNF.   Length of Stay: 9  Glori Bickers, MD  02/11/2018, 11:21 AM  Advanced Heart Failure Team Pager [phone] (M-F; 7a - 4p)  Please contact [facility] for night-coverage after hours (4p -7a ) and weekends on [email or web address]

## 2018-02-12 LAB — BASIC METABOLIC PANEL
Anion gap: 10 (ref 5–15)
BUN: 76 mg/dL — ABNORMAL HIGH (ref 8–23)
CO2: 31 mmol/L (ref 22–32)
Calcium: 8.3 mg/dL — ABNORMAL LOW (ref 8.9–10.3)
Chloride: 95 mmol/L — ABNORMAL LOW (ref 98–111)
Creatinine, Ser: 2.73 mg/dL — ABNORMAL HIGH (ref 0.44–1.00)
GFR calc Af Amer: 20 mL/min — ABNORMAL LOW (ref >60)
GFR calc non Af Amer: 17 mL/min — ABNORMAL LOW (ref >60)
Glucose, Bld: 86 mg/dL (ref 70–99)
Potassium: 3.2 mmol/L — ABNORMAL LOW (ref 3.5–5.1)
Sodium: 136 mmol/L (ref 135–145)

## 2018-02-12 LAB — COOXEMETRY PANEL
Carboxyhemoglobin: 2.3 % — ABNORMAL HIGH (ref 0.5–1.5)
Methemoglobin: 1 % (ref 0.0–1.5)
O2 Saturation: 79.1 %
Total hemoglobin: 12.2 g/dL (ref 12.0–16.0)

## 2018-02-12 LAB — MAGNESIUM: Magnesium: 1.7 mg/dL (ref 1.7–2.4)

## 2018-02-12 LAB — GLUCOSE, CAPILLARY
Glucose-Capillary: 90 mg/dL (ref 70–99)
Glucose-Capillary: 139 mg/dL — ABNORMAL HIGH (ref 70–99)
Glucose-Capillary: 202 mg/dL — ABNORMAL HIGH (ref 70–99)
Glucose-Capillary: 191 mg/dL — ABNORMAL HIGH (ref 70–99)

## 2018-02-12 MED ORDER — MAGNESIUM SULFATE 2 GM/50ML IV SOLN: 2.0000 g | Freq: Once | INTRAVENOUS | Status: DC

## 2018-02-12 MED ORDER — LEVOFLOXACIN IN D5W 500 MG/100ML IV SOLN: 500.0000 mg | INTRAVENOUS | Status: DC

## 2018-02-12 MED ORDER — SODIUM CHLORIDE 0.9 % IV SOLN
1.0000 g | INTRAVENOUS | Status: DC
  Administered 2018-02-12: 1 g via INTRAVENOUS
  Filled 2018-02-12 (×2): qty 10

## 2018-02-12 MED ORDER — OXYCODONE-ACETAMINOPHEN 5-325 MG PO TABS
1.0000 | ORAL_TABLET | ORAL | Status: DC | PRN
  Administered 2018-02-12 – 2018-02-16 (×16): 1 via ORAL
  Filled 2018-02-12 (×16): qty 1

## 2018-02-12 MED ORDER — MAGNESIUM OXIDE 400 (241.3 MG) MG PO TABS
800.0000 mg | ORAL_TABLET | Freq: Two times a day (BID) | ORAL | Status: DC
  Administered 2018-02-12 – 2018-02-16 (×9): 800 mg via ORAL
  Filled 2018-02-12 (×9): qty 2

## 2018-02-12 MED ORDER — POTASSIUM CHLORIDE CRYS ER 20 MEQ PO TBCR
40.0000 meq | EXTENDED_RELEASE_TABLET | Freq: Once | ORAL | Status: AC
  Administered 2018-02-12: 40 meq via ORAL
  Filled 2018-02-12: qty 2

## 2018-02-12 NOTE — Progress Notes (Addendum)
PROGRESS NOTE    Belinda [name]  WGY:[id number] DOB: March 24, 1951 DOA: 02/02/2018 PCP: Reynold Bowen, MD   Brief Narrative: Patient is a 67 year old female with past medical history of systolic heart failure, diabetes type 2, chronic kidney disease stage III-IV who presented to the emergency department with complaints of increased shortness of breath for last few days.  Denies any chest pain.  Reportedly she had not taken her Demadex at home.  Patient was admitted for the management of acute on chronic systolic CHF.  Heart failure team following.  Assessment & Plan:   Active Problems:   Acute systolic (congestive) heart failure (HCC)   Essential hypertension   Acute CHF (congestive heart failure) (HCC)   DM (diabetes mellitus), type 2 with renal complications (HCC)   Pressure injury of skin   Hyperglycemia   Renal failure (ARF), acute on chronic (HCC)  Acute on chronic systolic CHF: Echocardiogram showed reduced systolic function with ejection fraction of 45 to 40%.  Mild diffuse hypokinesis, grade 1 diastolic dysfunction.  Heart failure team following.  Currently on Lasix drip and metalazone.  [name] is currently on hold due to AKI and CKD.  We will continue to monitor BMP. Cardiology wants to rule out amyloidosis.  Myeloma panel sent but came out to be negative.Planning for PYP panel tomorrow. She continues to have excellent diuresis .  Peripheral edema seems to be improving.  There is significant decrease in the edema of her  upper extremities.  Right upper extremity edema: Most likely secondary to adjacent IV line. duplex study did not show DVT or SVT.  Hypertension: Currently blood pressure stable.  We will continue to monitor.  Coreg discontinued.  AKI on  CKD stage IV: Most likely secondary to cardiorenal syndrome.Currently improving.  Continue diuresis.  Entresto on hold.  Avoid nephrotoxic agents.  Hypokalemia/hypomagnesemia: Supplemented.  Obstructive sleep apnea: Sleep  study as an outpatient.  Diabetes type 2:  Continue current regimen.  Deconditioning/debility: Physical therapy evaluated and recommended SNF.SW following.  UTI?:  UA sent yesterday showed large amount of leukocytes.  Patient denies any dysuria.  We will follow-up urine culture.  At the meantime we will put her on Levaquin.  We will try to remove the Foley catheter tomorrow.  DVT prophylaxis: Heparin [place] Code Status: Full Family Communication: None present at the bedside Disposition Plan:  SNF after stabilization of heart failure.  Still on Lasix drip.   Consultants: Heart failure  Procedures: None  Antimicrobials: None  Subjective: Patient seen and examined the bedside this morning.  No active issues.  Peripheral edema continues to improve.  Continues to have excellent diuresis.  Objective: Vitals:   02/12/18 0050 02/12/18 0350 02/12/18 0400 02/12/18 0928  BP: (!) 148/92  (!) 162/89 134/74  Pulse: 99 96 97 (!) 103  Resp: 17 11 16 14   Temp: 98.7 F (37.1 C)  97.9 F (36.6 C) 98.6 F (37 C)  TempSrc: Oral  Oral Oral  SpO2: 100% 100% 100% 99%  Weight:  79.1 kg    Height:        Intake/Output Summary (Last 24 hours) at 02/12/2018 1154 Last data filed at 02/12/2018 0900 Gross per 24 hour  Intake 1260 ml  Output 5950 ml  Net -4690 ml   Filed Weights   02/11/18 0423 02/11/18 1136 02/12/18 0350  Weight: 90.7 kg 81.5 kg 79.1 kg    Examination:  General exam:Not in distress,obese HEENT:PERRL,Oral mucosa moist, Ear/Nose normal on gross exam Respiratory system: Bilateral decreased air  entry on the bases,few crackles Cardiovascular system: S1 & S2 heard, RRR. No JVD, murmurs, rubs, gallops or clicks.   Gastrointestinal system: Abdomen is nondistended, soft and nontender. No organomegaly or masses felt. Normal bowel sounds heard. Central nervous system: Alert and oriented. No focal neurological deficits. Extremities: Peripheral edema , no clubbing ,no cyanosis, distal  peripheral pulses palpable. Skin: No rashes, lesions or ulcers,no icterus ,no pallor MSK: Normal muscle bulk,tone ,power Psychiatry: Judgement and insight appear normal. Mood & affect appropriate.     Data Reviewed: I have personally reviewed following labs and imaging studies  CBC: No results for input(s): WBC, NEUTROABS, HGB, HCT, MCV, PLT in the last 168 hours. Basic Metabolic Panel: Recent Labs  Lab 02/08/18 0500 02/09/18 0400 02/10/18 0425 02/11/18 1616 02/12/18 0639  NA 134* 134* 134* 135 136  K 4.3 4.0 3.8 3.4* 3.2*  CL 101 99 99 97* 95*  CO2 24 26 28 28 31   GLUCOSE 307* 316* 197* 186* 86  BUN 71* 72* 78* 73* 76*  CREATININE 3.10* 3.08* 3.11* 2.82* 2.73*  CALCIUM 7.7* 7.9* 8.0* 8.1* 8.3*  MG 1.6* 1.9  --   --  1.7   GFR: Estimated Creatinine Clearance: 18.8 mL/min (A) (by C-G formula based on SCr of 2.73 mg/dL (H)). Liver Function Tests: No results for input(s): AST, ALT, ALKPHOS, BILITOT, PROT, ALBUMIN in the last 168 hours. No results for input(s): LIPASE, AMYLASE in the last 168 hours. No results for input(s): AMMONIA in the last 168 hours. Coagulation Profile: No results for input(s): INR, PROTIME in the last 168 hours. Cardiac Enzymes: No results for input(s): CKTOTAL, CKMB, CKMBINDEX, TROPONINI in the last 168 hours. BNP (last 3 results) Recent Labs    04/26/17 1246  PROBNP 4,620*   HbA1C: No results for input(s): HGBA1C in the last 72 hours. CBG: Recent Labs  Lab 02/11/18 1117 02/11/18 1635 02/11/18 2205 02/12/18 0653 02/12/18 1115  GLUCAP 164* 206* 191* 90 139*   Lipid Profile: No results for input(s): CHOL, HDL, LDLCALC, TRIG, CHOLHDL, LDLDIRECT in the last 72 hours. Thyroid Function Tests: No results for input(s): TSH, T4TOTAL, FREET4, T3FREE, THYROIDAB in the last 72 hours. Anemia Panel: No results for input(s): VITAMINB12, FOLATE, FERRITIN, TIBC, IRON, RETICCTPCT in the last 72 hours. Sepsis Labs: No results for input(s): PROCALCITON,  LATICACIDVEN in the last 168 hours.  Recent Results (from the past 240 hour(s))  MRSA PCR Screening     Status: Abnormal   Collection Time: 02/03/18 12:21 AM  Result Value Ref Range Status   MRSA by PCR POSITIVE (A) NEGATIVE Final    Comment:        The GeneXpert MRSA Assay (FDA approved for NASAL specimens only), is one component of a comprehensive MRSA colonization surveillance program. It is not intended to diagnose MRSA infection nor to guide or monitor treatment for MRSA infections. RESULT CALLED TO, READ BACK BY AND VERIFIED WITHDoristine Johns RN 0737 02/03/18 A BROWNING Performed at [facility], [place] [address], [place], [place] [address]          Radiology Studies: No results found.      Scheduled Meds: . ezetimibe-simvastatin  1 tablet Oral Daily  . fluticasone  2 spray Each Nare QHS  . guaiFENesin  1,200 mg Oral BID  . heparin  5,000 Units Subcutaneous Q8H  . hydrALAZINE  12.5 mg Oral Q8H  . insulin aspart  0-15 Units Subcutaneous TID WC  . insulin glargine  40 Units Subcutaneous QHS  .  isosorbide mononitrate  15 mg Oral Daily  . loratadine  10 mg Oral Daily  . metolazone  2.5 mg Oral BID  . nitroGLYCERIN  0.4 mg Sublingual UD  . potassium chloride  40 mEq Oral Once  . sodium chloride flush  10-40 mL Intracatheter Q12H   Continuous Infusions: . sodium chloride Stopped (02/03/18 0749)  . dextrose 5 % and 0.45% NaCl Stopped (02/08/18 1147)  . furosemide (LASIX) infusion 15 mg/hr (02/12/18 0013)     LOS: 10 days    Time spent: 25 mins.More than 50% of that time was spent in counseling and/or coordination of care.      Shelly Coss, MD [facility] Pager [phone]  If 7PM-7AM, please contact night-coverage [email or web address] Password [email or web address] 02/12/2018, 11:54 AM

## 2018-02-12 NOTE — Progress Notes (Signed)
Advanced Heart Failure Rounding Note  PCP-Cardiologist: Pixie Casino, MD   Subjective:    Continues to diurese briskly with lasix drip @ 15 mg per hour + metolazone.  Negative 5.1 L overnight. Weight down 27 pounds so far.   Renal function stable. Breathing feels better. No orthopnea or PND. Bloating improving Co-ox 79%  K 3.2. Mg 1.7.   UA with large leukocytes but no protein. Asymptomatic. Ucx pending. TRH started Levaquin   Renal u/s. Normal kidneys no hydro.   Echo (8/13) reviewed personally EF 45-50% with mild RV HK. Moderate TR. Small to moderate pericardial effusion echo texture suggestive of possible amyloid.   Myeloma Panel negative.    Objective:   Weight Range: 79.1 kg Body mass index is 34.06 kg/m.   Vital Signs:   Temp:  [97.9 F (36.6 C)-98.7 F (37.1 C)] 98.6 F (37 C) (08/18 0928) Pulse Rate:  [96-103] 103 (08/18 0928) Resp:  [11-17] 14 (08/18 0928) BP: (134-162)/(74-92) 134/74 (08/18 0928) SpO2:  [99 %-100 %] 99 % (08/18 0928) Weight:  [79.1 kg] 79.1 kg (08/18 0350) Last BM Date: 02/11/18  Weight change: Filed Weights   02/11/18 0423 02/11/18 1136 02/12/18 0350  Weight: 90.7 kg 81.5 kg 79.1 kg    Intake/Output:   Intake/Output Summary (Last 24 hours) at 02/12/2018 1335 Last data filed at 02/12/2018 1329 Gross per 24 hour  Intake 1701.84 ml  Output 5100 ml  Net -3398.16 ml      Physical Exam   General:  Elderly woman lying in bed No resp difficulty HEENT: normal Neck: supple.JVP 10 Carotids 2+ bilat; no bruits. No lymphadenopathy or thryomegaly appreciated. Cor: PMI nondisplaced. Tachy regular 2/6 TR Lungs: clear Abdomen: obese soft, nontender, nondistended. No hepatosplenomegaly. No bruits or masses. Good bowel sounds. Extremities: no cyanosis, clubbing, rash, 1+ edema RUE PICC. Mild edema RUE Neuro: alert & orientedx3, cranial nerves grossly intact. moves all 4 extremities w/o difficulty. Affect pleasant   Telemetry   NSR  90-105s low volts. personally reviewed.    Labs    CBC No results for input(s): WBC, NEUTROABS, HGB, HCT, MCV, PLT in the last 72 hours. Basic Metabolic Panel Recent Labs    02/11/18 1616 02/12/18 0639  NA 135 136  K 3.4* 3.2*  CL 97* 95*  CO2 28 31  GLUCOSE 186* 86  BUN 73* 76*  CREATININE 2.82* 2.73*  CALCIUM 8.1* 8.3*  MG  --  1.7   Liver Function Tests No results for input(s): AST, ALT, ALKPHOS, BILITOT, PROT, ALBUMIN in the last 72 hours. No results for input(s): LIPASE, AMYLASE in the last 72 hours. Cardiac Enzymes No results for input(s): CKTOTAL, CKMB, CKMBINDEX, TROPONINI in the last 72 hours.  BNP: BNP (last 3 results) Recent Labs    02/02/18 1501  BNP >4,500.0*    ProBNP (last 3 results) Recent Labs    04/26/17 1246  PROBNP 4,620*     D-Dimer No results for input(s): DDIMER in the last 72 hours. Hemoglobin A1C No results for input(s): HGBA1C in the last 72 hours. Fasting Lipid Panel No results for input(s): CHOL, HDL, LDLCALC, TRIG, CHOLHDL, LDLDIRECT in the last 72 hours. Thyroid Function Tests No results for input(s): TSH, T4TOTAL, T3FREE, THYROIDAB in the last 72 hours.  Invalid input(s): FREET3  Other results:   Imaging    No results found.   Medications:     Scheduled Medications: . ezetimibe-simvastatin  1 tablet Oral Daily  . fluticasone  2 spray Each  Nare QHS  . guaiFENesin  1,200 mg Oral BID  . heparin  5,000 Units Subcutaneous Q8H  . hydrALAZINE  12.5 mg Oral Q8H  . insulin aspart  0-15 Units Subcutaneous TID WC  . insulin glargine  40 Units Subcutaneous QHS  . isosorbide mononitrate  15 mg Oral Daily  . loratadine  10 mg Oral Daily  . magnesium oxide  800 mg Oral BID  . metolazone  2.5 mg Oral BID  . nitroGLYCERIN  0.4 mg Sublingual UD  . potassium chloride  40 mEq Oral Once  . sodium chloride flush  10-40 mL Intracatheter Q12H    Infusions: . sodium chloride Stopped (02/03/18 0749)  . cefTRIAXone (ROCEPHIN)   IV 1 g (02/12/18 1327)  . dextrose 5 % and 0.45% NaCl Stopped (02/08/18 1147)  . furosemide (LASIX) infusion 15 mg/hr (02/12/18 1329)    PRN Medications: acetaminophen **OR** acetaminophen, dextrose, ondansetron **OR** ondansetron (ZOFRAN) IV, sodium chloride flush    Patient Profile  Belinda S Simmonsis a 67 y.o.femalewith a hx of obesity, chronic combined systolic and diastolic heart failure due to premed NICM (ECHO 11/18 EF 25-30%), DM, HTN, HLD, and CKD stage IIIwho is being seen today for the evaluation of at the request of Dr. Debara Pickett for ADHF and massive volume overload.   Assessment/Plan   1. Acute on chronic systolic HF with massive volume overload - Admitted with massive volume overload and cardiorenal syndrome responding poorly to IV lasix with rising creatinine.  02/07/2018  - EF 2016 was 70% - ECHO 11/18 EF 45-50% Grade IDD RV mildly dilated. Peak PA pressure 43 mm hg.  - ECHO 02/07/18 EF 25-30% - Etiology unclear. Echo suggestive of cardiac amyloid. Ischemia and hypertensive CM also possible. Unable to cath with A/CKD.  - CO-OX 79%  - Continues with brisk diuresis. Weight down 27 pounds. Probably with another 5-10 pounds to go.  Continue lasix drip 15 mg per hour + metolazone 2.5 mg twice a day. - Renal function stable. Continue to follow creatinine closely with diuresis. No protein on UA - Off BB with ADHF - No ACE/ARB/ARNI/spiro with A/CKD - Titrate hydral/nitrates - Myeloma panel negative. PYP scan on Monday to check for TTR amyloid . Unable to cath with A/CKD.   2. A/CKD - baseline creatinine ~2.0-2.5. Followed by Dr Hollie Salk. - Creatinine trending up 2.9>3.14->3.1 ->3.08 ->3.1-> 2.7 - suspect cardiorenal syndrome - hold all nephrotoxic agents - support cardiac output as needed  3. Probable OSA - will need sleep study as outpatient  4. HTN - Has h/o severe HTN. BP ok today.  - Titrate hydral/nitrates for CHF  5.DM2 - Poorly controlled  - Primary team  managing  6. Severe Deconditioning PT/OT recommending SNF.   7. Asymptomatic pyuria - await culture. Management per primary team   Length of Stay: [place], [place]  02/12/2018, 1:34 PM  Advanced Heart Failure Team Pager [phone] (M-F; [place])  Please contact [facility] for night-coverage after hours (4p -7a ) and weekends on [email or web address]

## 2018-02-12 NOTE — Clinical Social Work Note (Signed)
Clinical Social Work Assessment  Patient Details  Name: Belinda [name] MRN: [id number] Date of Birth: [birth year]  Date of referral:  02/12/18               Reason for consult:  Facility Placement, Discharge Planning                Permission sought to share information with:  Facility Sport and exercise psychologist, Family Supports Permission granted to share information::  Yes, Verbal Permission Granted  Name::     Belinda [name]  Agency::  SNFs  Relationship::  spouse  Contact Information:  [phone]  Housing/Transportation Living arrangements for the past 2 months:  Single Family Home Source of Information:  Patient Patient Interpreter Needed:  None Criminal Activity/Legal Involvement Pertinent to Current Situation/Hospitalization:  No - Comment as needed Significant Relationships:  Spouse Lives with:  Spouse Do you feel safe going back to the place where you live?  Yes Need for family participation in patient care:  Yes (Comment)  Care giving concerns: Patient from home with spouse. PT recommending SNF.   Social Worker assessment / plan: CSW met with patient at bedside. Patient alert and oriented. CSW introduced self and role discussed disposition planning. Patient agreeable to SNF and prefers [facility] CSW faxed out initial referrals, will provide bed offers when available.   Patient is concerned about getting clothes from home. She states that he husband has trouble getting around due to history of being in a car accident, and likely won't be able to bring her anything from home.   CSW to follow and support.  Employment status:  Retired Research officer, political party) PT Recommendations:  [place] / Referral to community resources:  [place]  Patient/Family's Response to care: Patient appreciative of care.  Patient/Family's Understanding of and Emotional Response to Diagnosis, Current Treatment, and Prognosis:  Patient with understanding of her condition and agreeable to SNF.  Emotional Assessment Appearance:  Appears stated age Attitude/Demeanor/Rapport:  Engaged Affect (typically observed):  Accepting, Calm, Appropriate, Pleasant Orientation:  Oriented to Self, Oriented to Place, Oriented to Situation, Oriented to  Time Alcohol / Substance use:  Not Applicable Psych involvement (Current and /or in the community):  No (Comment)  Discharge Needs  Concerns to be addressed:  Discharge Planning Concerns, Care Coordination Readmission within the last 30 days:  No Current discharge risk:  Physical Impairment Barriers to Discharge:  Continued Medical Work up, [place], [place] 02/12/2018, 1:26 PM

## 2018-02-13 ENCOUNTER — Inpatient Hospital Stay (HOSPITAL_COMMUNITY): Payer: Medicare Other

## 2018-02-13 LAB — GLUCOSE, CAPILLARY
Glucose-Capillary: 107 mg/dL — ABNORMAL HIGH (ref 70–99)
Glucose-Capillary: 78 mg/dL (ref 70–99)
Glucose-Capillary: 136 mg/dL — ABNORMAL HIGH (ref 70–99)
Glucose-Capillary: 133 mg/dL — ABNORMAL HIGH (ref 70–99)

## 2018-02-13 LAB — BASIC METABOLIC PANEL
Anion gap: 12 (ref 5–15)
BUN: 74 mg/dL — ABNORMAL HIGH (ref 8–23)
CO2: 33 mmol/L — ABNORMAL HIGH (ref 22–32)
Calcium: 8.6 mg/dL — ABNORMAL LOW (ref 8.9–10.3)
Chloride: 93 mmol/L — ABNORMAL LOW (ref 98–111)
Creatinine, Ser: 2.76 mg/dL — ABNORMAL HIGH (ref 0.44–1.00)
GFR calc Af Amer: 19 mL/min — ABNORMAL LOW (ref >60)
GFR calc non Af Amer: 17 mL/min — ABNORMAL LOW (ref >60)
Glucose, Bld: 88 mg/dL (ref 70–99)
Potassium: 3.4 mmol/L — ABNORMAL LOW (ref 3.5–5.1)
Sodium: 138 mmol/L (ref 135–145)

## 2018-02-13 LAB — COOXEMETRY PANEL
Carboxyhemoglobin: 2 % — ABNORMAL HIGH (ref 0.5–1.5)
Methemoglobin: 1.3 % (ref 0.0–1.5)
O2 Saturation: 73.2 %
Total hemoglobin: 11.6 g/dL — ABNORMAL LOW (ref 12.0–16.0)

## 2018-02-13 LAB — URINE CULTURE: Culture: >=100000 — AB

## 2018-02-13 LAB — MAGNESIUM: Magnesium: 1.8 mg/dL (ref 1.7–2.4)

## 2018-02-13 MED ORDER — TECHNETIUM TC 99M PYROPHOSPHATE
20.0000 | Freq: Once | INTRAVENOUS | Status: AC
  Administered 2018-02-13: 20 via INTRAVENOUS

## 2018-02-13 MED ORDER — CEPHALEXIN 250 MG PO CAPS
250.0000 mg | ORAL_CAPSULE | Freq: Two times a day (BID) | ORAL | Status: DC
  Administered 2018-02-13 – 2018-02-16 (×6): 250 mg via ORAL
  Filled 2018-02-13 (×8): qty 1

## 2018-02-13 MED ORDER — CIPROFLOXACIN HCL 500 MG PO TABS: 250.0000 mg | ORAL_TABLET | Freq: Two times a day (BID) | ORAL | Status: DC

## 2018-02-13 MED ORDER — MAGNESIUM SULFATE 2 GM/50ML IV SOLN
2.0000 g | Freq: Once | INTRAVENOUS | Status: AC
  Administered 2018-02-13: 2 g via INTRAVENOUS
  Filled 2018-02-13: qty 50

## 2018-02-13 MED ORDER — CIPROFLOXACIN HCL 500 MG PO TABS: 500.0000 mg | ORAL_TABLET | Freq: Two times a day (BID) | ORAL | Status: DC

## 2018-02-13 MED ORDER — TORSEMIDE 20 MG PO TABS
60.0000 mg | ORAL_TABLET | Freq: Two times a day (BID) | ORAL | Status: DC
  Administered 2018-02-13 (×2): 60 mg via ORAL
  Filled 2018-02-13 (×2): qty 3

## 2018-02-13 MED ORDER — POTASSIUM CHLORIDE CRYS ER 20 MEQ PO TBCR
20.0000 meq | EXTENDED_RELEASE_TABLET | Freq: Once | ORAL | Status: AC
  Administered 2018-02-13: 20 meq via ORAL
  Filled 2018-02-13: qty 1

## 2018-02-13 MED ORDER — POTASSIUM CHLORIDE CRYS ER 20 MEQ PO TBCR
40.0000 meq | EXTENDED_RELEASE_TABLET | Freq: Once | ORAL | Status: AC
  Administered 2018-02-13: 40 meq via ORAL
  Filled 2018-02-13: qty 2

## 2018-02-13 NOTE — Progress Notes (Addendum)
Advanced Heart Failure Rounding Note  PCP-Cardiologist: Pixie Casino, MD   Subjective:    Continues to diurese briskly with lasix drip @ 15 mg per hour + metolazone.  Negative another 4.8 L, down 37 pounds so far.   Feeling OK this am. Denies lightheadedness or dizziness. SOB much improved. Worried about "making it to the bathroom" in time once foley removed. Also worried about going to SNF. Co-ox 73.2% this am.   K 3.4. Mg 1.8. Cr relatively stable at 2.76.   UA with large leukocytes but no protein. Asymptomatic. Ucx 100K GNR. TRH started Levaquin   Renal u/s. Normal kidneys no hydro.   Echo (8/13) reviewed personally EF 45-50% with mild RV HK. Moderate TR. Small to moderate pericardial effusion echo texture suggestive of possible amyloid.   Myeloma Panel negative.   Objective:   Weight Range: 74.8 kg Body mass index is 32.21 kg/m.   Vital Signs:   Temp:  [98.6 F (37 C)] 98.6 F (37 C) (08/18 0928) Pulse Rate:  [103-106] 104 (08/18 1601) Resp:  [13-17] 17 (08/18 1601) BP: (130-148)/(73-99) 148/99 (08/18 1601) SpO2:  [99 %-100 %] 100 % (08/18 1601) Weight:  [74.8 kg] 74.8 kg (08/19 0500) Last BM Date: 02/10/18  Weight change: Filed Weights   02/11/18 1136 02/12/18 0350 02/13/18 0500  Weight: 81.5 kg 79.1 kg 74.8 kg    Intake/Output:   Intake/Output Summary (Last 24 hours) at 02/13/2018 0744 Last data filed at 02/13/2018 6767 Gross per 24 hour  Intake 2061.84 ml  Output 6900 ml  Net -4838.16 ml      Physical Exam   General: Elderly. Lying in bed. NAD.  HEENT: Normal anicteric Neck: Supple. JVP 7-8 cm, Carotids 2+ bilat; no bruits. No thyromegaly or nodule noted. Cor: PMI nondisplaced. Tachy, regular. 2/6 TR.  Lungs: Clear. No wheeze Abdomen: Obese, non-tender, non-distended, no HSM. No bruits or masses. +BS  Extremities: No cyanosis, clubbing, or rash. 1+ edema, RUE PICC. Mild edema RUE.  Neuro: alert & oriented x 3, cranial nerves grossly  intact. moves all 4 extremities w/o difficulty. Affect pleasant   Telemetry   NSR 90-100s, low voltage, personally reviewed.   Labs    CBC No results for input(s): WBC, NEUTROABS, HGB, HCT, MCV, PLT in the last 72 hours. Basic Metabolic Panel Recent Labs    02/12/18 0639 02/13/18 0622  NA 136 138  K 3.2* 3.4*  CL 95* 93*  CO2 31 33*  GLUCOSE 86 88  BUN 76* 74*  CREATININE 2.73* 2.76*  CALCIUM 8.3* 8.6*  MG 1.7 1.8   Liver Function Tests No results for input(s): AST, ALT, ALKPHOS, BILITOT, PROT, ALBUMIN in the last 72 hours. No results for input(s): LIPASE, AMYLASE in the last 72 hours. Cardiac Enzymes No results for input(s): CKTOTAL, CKMB, CKMBINDEX, TROPONINI in the last 72 hours.  BNP: BNP (last 3 results) Recent Labs    02/02/18 1501  BNP >4,500.0*    ProBNP (last 3 results) Recent Labs    04/26/17 1246  PROBNP 4,620*     D-Dimer No results for input(s): DDIMER in the last 72 hours. Hemoglobin A1C No results for input(s): HGBA1C in the last 72 hours. Fasting Lipid Panel No results for input(s): CHOL, HDL, LDLCALC, TRIG, CHOLHDL, LDLDIRECT in the last 72 hours. Thyroid Function Tests No results for input(s): TSH, T4TOTAL, T3FREE, THYROIDAB in the last 72 hours.  Invalid input(s): FREET3  Other results:   Imaging    No results found.  Medications:     Scheduled Medications: . ezetimibe-simvastatin  1 tablet Oral Daily  . fluticasone  2 spray Each Nare QHS  . guaiFENesin  1,200 mg Oral BID  . heparin  5,000 Units Subcutaneous Q8H  . hydrALAZINE  12.5 mg Oral Q8H  . insulin aspart  0-15 Units Subcutaneous TID WC  . insulin glargine  40 Units Subcutaneous QHS  . isosorbide mononitrate  15 mg Oral Daily  . loratadine  10 mg Oral Daily  . magnesium oxide  800 mg Oral BID  . metolazone  2.5 mg Oral BID  . nitroGLYCERIN  0.4 mg Sublingual UD  . sodium chloride flush  10-40 mL Intracatheter Q12H    Infusions: . sodium chloride Stopped  (02/03/18 0749)  . cefTRIAXone (ROCEPHIN)  IV 1 g (02/12/18 1327)  . dextrose 5 % and 0.45% NaCl Stopped (02/08/18 1147)  . furosemide (LASIX) infusion 15 mg/hr (02/12/18 1912)    PRN Medications: acetaminophen **OR** acetaminophen, dextrose, ondansetron **OR** ondansetron (ZOFRAN) IV, oxyCODONE-acetaminophen, sodium chloride flush    Patient Profile  Belinda [name] a 67 y.o.femalewith a hx of obesity, chronic combined systolic and diastolic heart failure due to premed NICM (ECHO 11/18 EF 25-30%), DM, HTN, HLD, and CKD stage IIIwho is being seen today for the evaluation of at the request of Dr. Debara Pickett for ADHF and massive volume overload.   Assessment/Plan   1. Acute on chronic systolic HF with massive volume overload - Admitted with massive volume overload and cardiorenal syndrome responding poorly to IV lasix with rising creatinine.  02/07/2018  - EF 2016 was 70% - ECHO 11/18 EF 45-50% Grade IDD RV mildly dilated. Peak PA pressure 43 mm hg.  - ECHO 02/07/18 EF 25-30% - Etiology unclear. Echo suggestive of cardiac amyloid. Ischemia and hypertensive CM also possible. Unable to cath with A/CKD.  - CO-OX 73.2%.  - Continues with brisk diuresis. Weight down 37 pounds.  - CVP 6-7 this am.  - Continue lasix drip for this am. Hold metolazone. Possibly switch to po this evening.  - Renal function stable.  - Off BB with ADHF - No ACE/ARB/ARNI/spiro with A/CKD - Titrate hydral/nitrates - Myeloma panel negative.  - Scheduled for PYP scan today to check for TTR amyloid . Unable to cath with A/CKD.  2. A/CKD - Baseline creatinine ~2.0-2.5. Followed by Dr Hollie Salk. - Creatinine stable 2.9>3.14->3.1 ->3.08 ->3.1-> 2.73 -> 2.76 - Suspect cardiorenal syndrome - Holding nephrotoxic agents - Support cardiac output as needed 3. Probable OSA - Will need sleep study as outpatient - No change.  4. HTN - Has h/o severe HTN. BP OK today.  - Titrate hydral/nitrates for CHF 5.DM2 - Poorly  controlled. - Per primary team.  6. Severe Deconditioning - PT/OT recommending SNF. No change.  7. Asymptomatic pyuria - UCx with > 100,000 colonies gram negative Rods. Speciation pending.  - Management per primary team   Length of Stay: [address] [place]  02/13/2018, 7:44 AM  Advanced Heart Failure Team Pager [phone] (M-F; 7a - 4p)  Please contact [facility] for night-coverage after hours (4p -7a ) and weekends on [email or web address]  Patient seen and examined with the above-signed Advanced Practice Provider and/or Housestaff. I personally reviewed laboratory data, imaging studies and relevant notes. I independently examined the patient and formulated the important aspects of the plan. I have edited the note to reflect any of my changes or salient points. I have personally discussed the plan with the patient and/or family.  Volume status much improved. Weight down almost 40 pounds. Creatinine stable Will stop IV lasix and metolazone. Switch to po torsemide. Can pull Foley. Plan PYP scan today. Probably stable for SNF late tomorrow or Wednesday. I would like top assess response to po diuretic dosing prior to d/c. Rx of UTI per primary team.   Glori Bickers, MD  9:06 AM

## 2018-02-13 NOTE — Plan of Care (Signed)
  Problem: Health Behavior/Discharge Planning: Goal: Ability to manage health-related needs will improve Outcome: Progressing   Problem: Clinical Measurements: Goal: Ability to maintain clinical measurements within normal limits will improve Outcome: Progressing Goal: Will remain free from infection Outcome: Progressing   

## 2018-02-13 NOTE — Progress Notes (Signed)
Physical Therapy Treatment Patient Details Name: Belinda [name] MRN: [id number] DOB: 02/06/1951 Today's Date: 02/13/2018    History of Present Illness Belinda [name] is a 67 y.o F who presented to the ED with SOB and weakness. PMH includes CHF, HTN, HLD, CKD, Orthopnea, E9FY with complications, Acute Renal Failure, and a history of falling.    PT Comments    Pt with steady progress. Pt requires encouragement and reassurance to progress mobility.   Follow Up Recommendations  SNF;Supervision/Assistance - 24 hour     Equipment Recommendations  None recommended by PT    Recommendations for Other Services OT consult     Precautions / Restrictions Precautions Precautions: Fall Precaution Comments: pt reports increased number of falls at home prior to this admission Restrictions Weight Bearing Restrictions: No    Mobility  Bed Mobility Overal bed mobility: Needs Assistance Bed Mobility: Supine to Sit     Supine to sit: Mod assist;+2 for physical assistance     General bed mobility comments: Assist to move legs off of bed, elevate trunk into sitting, and bring hips to EOB.  Transfers Overall transfer level: Needs assistance Equipment used: Ambulation equipment used Transfers: Sit to/from Stand Sit to Stand: Mod assist;+2 physical assistance;Min assist         General transfer comment: Assist to bring hips up. Pt needs extra time and encouragement to come to stand. Used stedy to transfer from bed to recliner.   Ambulation/Gait                 Stairs             Wheelchair Mobility    Modified Rankin (Stroke Patients Only)       Balance Overall balance assessment: Needs assistance Sitting-balance support: Feet supported;No upper extremity supported Sitting balance-Leahy Scale: Fair     Standing balance support: Bilateral upper extremity supported Standing balance-Leahy Scale: Poor Standing balance comment: Pt stood with Stedy with min  assist to maintain for 1 minute and then 2 minutes.                            Cognition Arousal/Alertness: Awake/alert Behavior During Therapy: WFL for tasks assessed/performed;Flat affect Overall Cognitive Status: Within Functional Limits for tasks assessed                                        Exercises      General Comments        Pertinent Vitals/Pain Pain Assessment: Faces Faces Pain Scale: Hurts little more Pain Location: neck Pain Descriptors / Indicators: Discomfort Pain Intervention(s): Limited activity within patient's tolerance;Monitored during session;Patient requesting pain meds-RN notified;Repositioned    Home Living                      Prior Function            PT Goals (current goals can now be found in the care plan section) Progress towards PT goals: Progressing toward goals    Frequency    Min 2X/week      PT Plan Current plan remains appropriate    Co-evaluation              AM-PAC PT "6 Clicks" Daily Activity  Outcome Measure  Difficulty turning over in bed (including adjusting bedclothes, sheets and blankets)?: Unable Difficulty moving  from lying on back to sitting on the side of the bed? : Unable Difficulty sitting down on and standing up from a chair with arms (e.g., wheelchair, bedside commode, etc,.)?: Unable Help needed moving to and from a bed to chair (including a wheelchair)?: A Lot Help needed walking in hospital room?: Total Help needed climbing 3-5 steps with a railing? : Total 6 Click Score: 7    End of Session Equipment Utilized During Treatment: Gait belt Activity Tolerance: Patient tolerated treatment well Patient left: with call bell/phone within reach;in chair Nurse Communication: Mobility status;Need for lift equipment PT Visit Diagnosis: Unsteadiness on feet (R26.81);Other abnormalities of gait and mobility (R26.89);Repeated falls (R29.6);Muscle weakness (generalized)  (M62.81);History of falling (Z91.81);Difficulty in walking, not elsewhere classified (R26.2)     Time: 7356-7014 PT Time Calculation (min) (ACUTE ONLY): 26 min  Charges:  $Therapeutic Activity: 23-37 mins                     [facility] PT [place] 02/13/2018, 5:22 PM

## 2018-02-13 NOTE — Progress Notes (Signed)
PROGRESS NOTE    Belinda [name]  ZOX:[id number] DOB: Feb 18, 1951 DOA: 02/02/2018 PCP: Reynold Bowen, MD   Brief Narrative: Patient is a 67 year old female with past medical history of systolic heart failure, diabetes type 2, chronic kidney disease stage III-IV who presented to the emergency department with complaints of increased shortness of breath for last few days.  Denies any chest pain.  Reportedly she had not taken her Demadex at home.  Patient was admitted for the management of acute on chronic systolic CHF.  Heart failure team following.  Assessment & Plan:   Active Problems:   Acute systolic (congestive) heart failure (HCC)   Essential hypertension   Acute CHF (congestive heart failure) (HCC)   DM (diabetes mellitus), type 2 with renal complications (HCC)   Pressure injury of skin   Hyperglycemia   Renal failure (ARF), acute on chronic (HCC)  Acute on chronic systolic CHF: Echocardiogram showed reduced systolic function with ejection fraction of 45 to 40%.  Mild diffuse hypokinesis, grade 1 diastolic dysfunction.  Heart failure team following.  D/Ced Lasix drip and metalazone and started on Torsemide.  Belinda [name] is currently on hold due to AKI and CKD.  We will continue to monitor BMP. Cardiology wants to rule out amyloidosis.  Myeloma panel sent but came out to be negative.Planning for PYP scan today. She continues to have excellent diuresis .  Peripheral edema seems to be improving.  There is significant decrease in the edema of her  upper extremities.  Right upper extremity edema: Most likely secondary to adjacent IV line. duplex study did not show DVT or SVT.Much improved.  Hypertension: Currently blood pressure stable.  We will continue to monitor.  Coreg discontinued.  AKI on  CKD stage IV: Most likely secondary to cardiorenal syndrome.Currently improving.  Continue diuresis.  Entresto on hold.  Avoid nephrotoxic agents.  Hypokalemia/hypomagnesemia:  Supplemented.  Obstructive sleep apnea: Sleep study as an outpatient.  Diabetes type 2:  Continue current regimen.  Deconditioning/debility: Physical therapy evaluated and recommended SNF.SW following.  UTI?:Patient denies any dysuria.  Urine culture showed Klebsiella.  Ceftriaxone changed to ciprofloxacin. Foley will be removed today.  DVT prophylaxis: Heparin Holt Code Status: Full Family Communication: None present at the bedside Disposition Plan:  SNF in 1-2 days   Consultants: Heart failure  Procedures: None  Antimicrobials: None  Subjective: Patient seen and examined the bedside this morning.  Looks better today but complains of persistent weakness.  Peripheral edema continues to improve.  Continues to have excellent diuresis.  Objective: Vitals:   02/12/18 1324 02/12/18 1601 02/13/18 0500 02/13/18 1000  BP: 130/73 (!) 148/99  126/77  Pulse: (!) 106 (!) 104    Resp: 13 17    Temp:    98.2 F (36.8 C)  TempSrc:    Oral  SpO2: 100% 100%    Weight:   74.8 kg   Height:        Intake/Output Summary (Last 24 hours) at 02/13/2018 1255 Last data filed at 02/13/2018 1006 Gross per 24 hour  Intake 2115.99 ml  Output 4600 ml  Net -2484.01 ml   Filed Weights   02/11/18 1136 02/12/18 0350 02/13/18 0500  Weight: 81.5 kg 79.1 kg 74.8 kg    Examination:  General exam:Not in distress,obese HEENT:PERRL,Oral mucosa moist, Ear/Nose normal on gross exam Respiratory system: Bilateral decreased air entry on the bases Cardiovascular system: S1 & S2 heard, RRR. No JVD, murmurs, rubs, gallops or clicks.   Gastrointestinal system: Abdomen is nondistended, soft  and nontender. No organomegaly or masses felt. Normal bowel sounds heard. Central nervous system: Alert and oriented. No focal neurological deficits. Extremities: Improving peripheral edema , no clubbing ,no cyanosis, distal peripheral pulses palpable. Skin: No rashes, lesions or ulcers,no icterus ,no pallor MSK: Normal  muscle bulk,tone ,power Psychiatry: Judgement and insight appear normal. Mood & affect appropriate.     Data Reviewed: I have personally reviewed following labs and imaging studies  CBC: No results for input(s): WBC, NEUTROABS, HGB, HCT, MCV, PLT in the last 168 hours. Basic Metabolic Panel: Recent Labs  Lab 02/08/18 0500 02/09/18 0400 02/10/18 0425 02/11/18 1616 02/12/18 0639 02/13/18 0622  NA 134* 134* 134* 135 136 138  K 4.3 4.0 3.8 3.4* 3.2* 3.4*  CL 101 99 99 97* 95* 93*  CO2 24 26 28 28 31  33*  GLUCOSE 307* 316* 197* 186* 86 88  BUN 71* 72* 78* 73* 76* 74*  CREATININE 3.10* 3.08* 3.11* 2.82* 2.73* 2.76*  CALCIUM 7.7* 7.9* 8.0* 8.1* 8.3* 8.6*  MG 1.6* 1.9  --   --  1.7 1.8   GFR: Estimated Creatinine Clearance: 18.1 mL/min (A) (by C-G formula based on SCr of 2.76 mg/dL (H)). Liver Function Tests: No results for input(s): AST, ALT, ALKPHOS, BILITOT, PROT, ALBUMIN in the last 168 hours. No results for input(s): LIPASE, AMYLASE in the last 168 hours. No results for input(s): AMMONIA in the last 168 hours. Coagulation Profile: No results for input(s): INR, PROTIME in the last 168 hours. Cardiac Enzymes: No results for input(s): CKTOTAL, CKMB, CKMBINDEX, TROPONINI in the last 168 hours. BNP (last 3 results) Recent Labs    04/26/17 1246  PROBNP 4,620*   HbA1C: No results for input(s): HGBA1C in the last 72 hours. CBG: Recent Labs  Lab 02/12/18 0653 02/12/18 1115 02/12/18 1608 02/12/18 2127 02/13/18 0609  GLUCAP 90 139* 202* 191* 107*   Lipid Profile: No results for input(s): CHOL, HDL, LDLCALC, TRIG, CHOLHDL, LDLDIRECT in the last 72 hours. Thyroid Function Tests: No results for input(s): TSH, T4TOTAL, FREET4, T3FREE, THYROIDAB in the last 72 hours. Anemia Panel: No results for input(s): VITAMINB12, FOLATE, FERRITIN, TIBC, IRON, RETICCTPCT in the last 72 hours. Sepsis Labs: No results for input(s): PROCALCITON, LATICACIDVEN in the last 168  hours.  Recent Results (from the past 240 hour(s))  Culture, Urine     Status: Abnormal   Collection Time: 02/11/18  4:46 PM  Result Value Ref Range Status   Specimen Description URINE, RANDOM  Final   Special Requests   Final    NONE Performed at [facility], [address], [place], [place] [address]    Culture >=100,000 COLONIES/mL KLEBSIELLA PNEUMONIAE (A)  Final   Report Status 02/13/2018 FINAL  Final   Organism ID, Bacteria KLEBSIELLA PNEUMONIAE (A)  Final      Susceptibility   Klebsiella pneumoniae - MIC*    AMPICILLIN RESISTANT Resistant     CEFAZOLIN <=4 SENSITIVE Sensitive     CEFTRIAXONE <=1 SENSITIVE Sensitive     CIPROFLOXACIN <=0.25 SENSITIVE Sensitive     GENTAMICIN <=1 SENSITIVE Sensitive     IMIPENEM <=0.25 SENSITIVE Sensitive     NITROFURANTOIN 32 SENSITIVE Sensitive     TRIMETH/SULFA <=20 SENSITIVE Sensitive     AMPICILLIN/SULBACTAM 4 SENSITIVE Sensitive     PIP/TAZO <=4 SENSITIVE Sensitive     Extended ESBL NEGATIVE Sensitive     * >=100,000 COLONIES/mL KLEBSIELLA PNEUMONIAE         Radiology Studies: No results found.  Scheduled Meds: . ciprofloxacin  250 mg Oral BID  . ezetimibe-simvastatin  1 tablet Oral Daily  . fluticasone  2 spray Each Nare QHS  . guaiFENesin  1,200 mg Oral BID  . heparin  5,000 Units Subcutaneous Q8H  . hydrALAZINE  12.5 mg Oral Q8H  . insulin aspart  0-15 Units Subcutaneous TID WC  . insulin glargine  40 Units Subcutaneous QHS  . isosorbide mononitrate  15 mg Oral Daily  . loratadine  10 mg Oral Daily  . magnesium oxide  800 mg Oral BID  . nitroGLYCERIN  0.4 mg Sublingual UD  . sodium chloride flush  10-40 mL Intracatheter Q12H  . torsemide  60 mg Oral BID   Continuous Infusions: . sodium chloride Stopped (02/13/18 1005)  . dextrose 5 % and 0.45% NaCl Stopped (02/08/18 1147)     LOS: 11 days    Time spent: 25 mins.More than 50% of that time was spent in counseling and/or coordination of  care.      Shelly Coss, MD [facility] Pager [phone]  If 7PM-7AM, please contact night-coverage [email or web address] Password [facility] 02/13/2018, 12:55 PM

## 2018-02-14 LAB — COOXEMETRY PANEL
Carboxyhemoglobin: 1.9 % — ABNORMAL HIGH (ref 0.5–1.5)
Methemoglobin: 1.3 % (ref 0.0–1.5)
O2 Saturation: 69.6 %
Total hemoglobin: 13.2 g/dL (ref 12.0–16.0)

## 2018-02-14 LAB — CBC
HCT: 39.7 % (ref 36.0–46.0)
Hemoglobin: 13.6 g/dL (ref 12.0–15.0)
MCH: 25.8 pg — ABNORMAL LOW (ref 26.0–34.0)
MCHC: 34.3 g/dL (ref 30.0–36.0)
MCV: 75.3 fL — ABNORMAL LOW (ref 78.0–100.0)
Platelets: 445 10*3/uL — ABNORMAL HIGH (ref 150–400)
RBC: 5.27 MIL/uL — ABNORMAL HIGH (ref 3.87–5.11)
RDW: 20.8 % — ABNORMAL HIGH (ref 11.5–15.5)
WBC: 8.5 10*3/uL (ref 4.0–10.5)

## 2018-02-14 LAB — BASIC METABOLIC PANEL
Anion gap: 9 (ref 5–15)
BUN: 78 mg/dL — ABNORMAL HIGH (ref 8–23)
CO2: 35 mmol/L — ABNORMAL HIGH (ref 22–32)
Calcium: 8.6 mg/dL — ABNORMAL LOW (ref 8.9–10.3)
Chloride: 91 mmol/L — ABNORMAL LOW (ref 98–111)
Creatinine, Ser: 3.27 mg/dL — ABNORMAL HIGH (ref 0.44–1.00)
GFR calc Af Amer: 16 mL/min — ABNORMAL LOW (ref >60)
GFR calc non Af Amer: 14 mL/min — ABNORMAL LOW (ref >60)
Glucose, Bld: 73 mg/dL (ref 70–99)
Potassium: 4 mmol/L (ref 3.5–5.1)
Sodium: 135 mmol/L (ref 135–145)

## 2018-02-14 LAB — GLUCOSE, CAPILLARY
Glucose-Capillary: 140 mg/dL — ABNORMAL HIGH (ref 70–99)
Glucose-Capillary: 35 mg/dL — CL (ref 70–99)
Glucose-Capillary: 66 mg/dL — ABNORMAL LOW (ref 70–99)
Glucose-Capillary: 68 mg/dL — ABNORMAL LOW (ref 70–99)
Glucose-Capillary: 126 mg/dL — ABNORMAL HIGH (ref 70–99)
Glucose-Capillary: 271 mg/dL — ABNORMAL HIGH (ref 70–99)

## 2018-02-14 MED ORDER — DIPHENHYDRAMINE HCL 25 MG PO CAPS
25.0000 mg | ORAL_CAPSULE | Freq: Four times a day (QID) | ORAL | Status: DC | PRN
  Administered 2018-02-14 – 2018-02-15 (×2): 25 mg via ORAL
  Filled 2018-02-14 (×2): qty 1

## 2018-02-14 MED ORDER — INSULIN GLARGINE 100 UNIT/ML ~~LOC~~ SOLN
10.0000 [IU] | Freq: Every day | SUBCUTANEOUS | Status: DC
  Administered 2018-02-14: 10 [IU] via SUBCUTANEOUS
  Filled 2018-02-14: qty 0.1

## 2018-02-14 MED ORDER — DEXTROSE 50 % IV SOLN
INTRAVENOUS | Status: AC
  Filled 2018-02-14: qty 50

## 2018-02-14 NOTE — Progress Notes (Signed)
Hypoglycemic Event  CBG: 35 Treatment: D50 IV 50 mL  Symptoms: Sweaty  Follow-up CBG: [id number] CBG Result:140  Possible Reasons for Event: Unknown  Comments/MD notified:pending    Gordy Levan

## 2018-02-14 NOTE — Plan of Care (Signed)
  Problem: Health Behavior/Discharge Planning: Goal: Ability to manage health-related needs will improve Outcome: Progressing   Problem: Clinical Measurements: Goal: Ability to maintain clinical measurements within normal limits will improve Outcome: Progressing Goal: Will remain free from infection Outcome: Progressing Goal: Diagnostic test results will improve Outcome: Progressing Goal: Respiratory complications will improve Outcome: Progressing   

## 2018-02-14 NOTE — Progress Notes (Signed)
Occupational Therapy Treatment Patient Details Name: Belinda [name] MRN: [id number] DOB: February 25, 1951 Today's Date: 02/14/2018    History of present illness Belinda [name] is a 67 y.o F who presented to the ED with SOB and weakness. PMH includes CHF, HTN, HLD, CKD, Orthopnea, W0JW with complications, Acute Renal Failure, and a history of falling.   OT comments  Pt making progress with functional goals. Pt sat EOB  Max - mod A with bed mobility for grooming, simulated bathing and UB dressing tasks; also ate some of lunch sitting EOB. Pt asked this OT many questions about SNF level therapy and OT educated pt on SNF level of care and what is expected with participation in rehab. Pt c/o "itching all over due to ABX". Possible d/c to SNF this afternoon  Follow Up Recommendations  SNF;Supervision/Assistance - 24 hour    Equipment Recommendations  Other (comment)(TBD at next venue of care)    Recommendations for Other Services      Precautions / Restrictions Precautions Precautions: Fall Precaution Comments: pt reports increased number of falls at home prior to this admission Restrictions Weight Bearing Restrictions: No       Mobility Bed Mobility Overal bed mobility: Needs Assistance Bed Mobility: Supine to Sit     Supine to sit: Max assist;Mod assist;HOB elevated Sit to supine: Max assist   General bed mobility comments: Assist to move legs off of bed, elevate trunk into sitting, and bring hips to EOB. and with LEs back onto bed  Transfers                      Balance Overall balance assessment: Needs assistance Sitting-balance support: Feet supported;No upper extremity supported Sitting balance-Leahy Scale: Fair Sitting balance - Comments: min - min guard A for dynamic activities sitting EOB                                   ADL either performed or assessed with clinical judgement   ADL Overall ADL's : Needs assistance/impaired     Grooming:  Sitting;Min guard;Wash/dry hands;Wash/dry face Grooming Details (indicate cue type and reason): min A for balance/support sitting EOB Upper Body Bathing: Sitting;Minimal assistance Upper Body Bathing Details (indicate cue type and reason): min A for balance/support sitting EOB Lower Body Bathing: Moderate assistance;Sitting/lateral leans Lower Body Bathing Details (indicate cue type and reason): simulated. min A for balance/support sitting EOB Upper Body Dressing : Minimal assistance;Sitting Upper Body Dressing Details (indicate cue type and reason): min A for balance/support sitting EOB. min A for balance/support sitting EOB                   General ADL Comments: pt sat EOB x 16 minutes for ADL tasks     Vision Baseline Vision/History: Wears glasses Wears Glasses: At all times     Perception     Praxis      Cognition Arousal/Alertness: Awake/alert Behavior During Therapy: [facility] for tasks assessed/performed;Flat affect Overall Cognitive Status: Within Functional Limits for tasks assessed                                          Exercises     Shoulder Instructions       General Comments      Pertinent Vitals/ Pain  Pain Assessment: Faces Faces Pain Scale: Hurts a little bit Pain Location: neck Pain Descriptors / Indicators: Discomfort Pain Intervention(s): Monitored during session;Repositioned  Home Living                                          Prior Functioning/Environment              Frequency  Min 2X/week        Progress Toward Goals  OT Goals(current goals can now be found in the care plan section)  Progress towards OT goals: Progressing toward goals     Plan Discharge plan remains appropriate    Co-evaluation                 AM-PAC PT "6 Clicks" Daily Activity     Outcome Measure   Help from another person eating meals?: A Little Help from another person taking care of personal  grooming?: A Little Help from another person toileting, which includes using toliet, bedpan, or urinal?: Total Help from another person bathing (including washing, rinsing, drying)?: A Lot Help from another person to put on and taking off regular upper body clothing?: A Little Help from another person to put on and taking off regular lower body clothing?: Total 6 Click Score: 13    End of Session    OT Visit Diagnosis: Muscle weakness (generalized) (M62.81);Unsteadiness on feet (R26.81)   Activity Tolerance Patient tolerated treatment well   Patient Left with call bell/phone within reach;in bed   Nurse Communication      Functional Assessment Tool Used: AM-PAC 6 Clicks Daily Activity   Time: 9480-1655 OT Time Calculation (min): 25 min  Charges: OT General Charges $OT Visit: 1 Visit OT Treatments $Self Care/Home Management : 8-22 mins $Therapeutic Activity: 8-22 mins     [name] 02/14/2018, 1:50 PM

## 2018-02-14 NOTE — Care Management Important Message (Signed)
Important Message  Patient Details  Name: Belinda [name] MRN: [id number] Date of Birth: [birth year]   Medicare Important Message Given:  Yes    Carles Collet, RN 02/14/2018, 9:42 AM

## 2018-02-14 NOTE — Progress Notes (Addendum)
Advanced Heart Failure Rounding Note  PCP-Cardiologist: Pixie Casino, MD   Subjective:    Switched to po torsemide 02/13/18. Weight down another 4 lbs. Coox 69.6%.   K 4.0. Cr up to 3.27.  CVP ~3  Feeling better from SOB perspective, though still SOB with exertion. Feels sluggish this am, but states she always does after her sugar drops.  (As low as CBG 35 this am)  UA with large leukocytes but no protein. Asymptomatic. Ucx 100K GNR. TRH started Levaquin   Renal u/s. Normal kidneys no hydro.   Echo (8/13) reviewed personally EF 45-50% with mild RV HK. Moderate TR. Small to moderate pericardial effusion echo texture suggestive of possible amyloid.   Myeloma Panel negative.   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   Objective:   Weight Range: 73 kg Body mass index is 31.43 kg/m.   Vital Signs:   Temp:  [98.2 F (36.8 C)-98.6 F (37 C)] 98.6 F (37 C) (08/20 0902) Pulse Rate:  [94-109] 94 (08/20 0902) Resp:  [10-19] 15 (08/20 0902) BP: (126-154)/(77-97) 154/96 (08/20 0902) SpO2:  [99 %-100 %] 100 % (08/20 0902) Weight:  [73 kg] 73 kg (08/20 0656) Last BM Date: 02/10/18  Weight change: Filed Weights   02/12/18 0350 02/13/18 0500 02/14/18 0656  Weight: 79.1 kg 74.8 kg 73 kg    Intake/Output:   Intake/Output Summary (Last 24 hours) at 02/14/2018 0919 Last data filed at 02/14/2018 0600 Gross per 24 hour  Intake 822.93 ml  Output 3200 ml  Net -2377.07 ml      Physical Exam   General: Elderly. Lying in bed. NAD  HEENT: Normal Neck: Supple. JVP 6-7 cm. Carotids 2+ bilat; no bruits. No thyromegaly or nodule noted. Cor: PMI nondisplaced. Slightly tachy, regular. 2/6 TR.   Lungs: CTAB, normal effort. Abdomen: Soft, non-tender, non-distended, no HSM. No bruits or masses. +BS  Extremities: No cyanosis, clubbing, or rash. 1+ edema. RUE PICC. Mild edema RUE.   Neuro: Alert & orientedx3, cranial nerves grossly intact. moves all 4  extremities w/o difficulty. Affect pleasant   Telemetry   NSR 90-100s, low voltage, personally reviewed.   Labs    CBC No results for input(s): WBC, NEUTROABS, HGB, HCT, MCV, PLT in the last 72 hours. Basic Metabolic Panel Recent Labs    02/12/18 0639 02/13/18 0622 02/14/18 0500  NA 136 138 135  K 3.2* 3.4* 4.0  CL 95* 93* 91*  CO2 31 33* 35*  GLUCOSE 86 88 73  BUN 76* 74* 78*  CREATININE 2.73* 2.76* 3.27*  CALCIUM 8.3* 8.6* 8.6*  MG 1.7 1.8  --    Liver Function Tests No results for input(s): AST, ALT, ALKPHOS, BILITOT, PROT, ALBUMIN in the last 72 hours. No results for input(s): LIPASE, AMYLASE in the last 72 hours. Cardiac Enzymes No results for input(s): CKTOTAL, CKMB, CKMBINDEX, TROPONINI in the last 72 hours.  BNP: BNP (last 3 results) Recent Labs    02/02/18 1501  BNP >4,500.0*    ProBNP (last 3 results) Recent Labs    04/26/17 1246  PROBNP 4,620*     D-Dimer No results for input(s): DDIMER in the last 72 hours. Hemoglobin A1C No results for input(s): HGBA1C in the last 72 hours. Fasting Lipid Panel No results for input(s): CHOL, HDL, LDLCALC, TRIG, CHOLHDL, LDLDIRECT in the last 72 hours. Thyroid Function Tests No results for input(s): TSH, T4TOTAL, T3FREE, THYROIDAB in the last 72 hours.  Invalid input(s): FREET3  Other results:   Imaging    Nm Tumor Localization W Spect  Result Date: 02/13/2018 CLINICAL DATA:  HEART FAILURE. CONCERN FOR CARDIAC AMYLOIDOSIS. EXAM: NUCLEAR MEDICINE TUMOR LOCALIZATION. PYP CARDIAC AMYLOIDOSIS SCAN WITH SPECT TECHNIQUE: Following intravenous administration of radiopharmaceutical, anterior planar images of the chest were obtained. Regions of interest were placed on the heart and contralateral chest wall for quantitative assessment. Additional SPECT imaging of the chest was obtained. RADIOPHARMACEUTICALS:  17.5 mCi TECHNETIUM 99 PYROPHOSPHATE FINDINGS: Planar Visual assessment: Anterior planar imaging  demonstrates radiotracer uptake within the heart less than than uptake within the adjacent ribs (Grade 1). Quantitative assessment : Quantitative assessment of the cardiac uptake compared to the contralateral chest wall is equal to 1.02 (H/CL = 1.02). SPECT assessment: SPECT imaging of the chest demonstrates no significant radiotracer accumulation within the LEFT ventricle. IMPRESSION: Visual and quantitative assessment (grade 1, H/CLL equal 1.0 ) are equivocal for transthyretin amyloidosis. Consider follow-up imaging in 6-12 months or as clinically indicated. Of note: A negative or mildly positive PYP does not exclude AL amyloid. In addition, equivocal results could represent AL amyloid or early TTR amyloid Electronically Signed   By: Kerby Moors M.D.   On: 02/13/2018 15:04     Medications:     Scheduled Medications: . cephALEXin  250 mg Oral Q12H  . ezetimibe-simvastatin  1 tablet Oral Daily  . fluticasone  2 spray Each Nare QHS  . guaiFENesin  1,200 mg Oral BID  . heparin  5,000 Units Subcutaneous Q8H  . hydrALAZINE  12.5 mg Oral Q8H  . insulin aspart  0-15 Units Subcutaneous TID WC  . insulin glargine  10 Units Subcutaneous QHS  . isosorbide mononitrate  15 mg Oral Daily  . loratadine  10 mg Oral Daily  . magnesium oxide  800 mg Oral BID  . nitroGLYCERIN  0.4 mg Sublingual UD  . sodium chloride flush  10-40 mL Intracatheter Q12H  . torsemide  60 mg Oral BID    Infusions: . sodium chloride 10 mL/hr at 02/14/18 0400  . 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 Debara Pickett for ADHF and massive volume overload.   Assessment/Plan   1. Acute on chronic systolic HF with massive volume overload - Admitted with massive volume overload and cardiorenal syndrome responding poorly to IV lasix with rising creatinine.  02/07/2018  - EF 2016 was 70% - ECHO 11/18 EF 45-50% Grade IDD RV mildly dilated. Peak PA pressure 43 mm hg.  - ECHO 02/07/18 EF 25-30% - Etiology unclear. Echo suggestive of cardiac amyloid. Ischemia and hypertensive CM also possible. Unable to cath with A/CKD.  - CO-OX 69.6%.  - Continues with brisk diuresis. Weight down 41 pounds.  - CVP 2-3 this am.  - Hold torsemide today. Would plan to resume tomorrow vs 02/16/18. Will discuss with MD. - Renal function stable.  - Off BB with ADHF - No ACE/ARB/ARNI/spiro with A/CKD - Titrate hydral/nitrates - Myeloma panel negative.  - Scheduled for PYP scan today to check for TTR amyloid . Unable to cath with A/CKD.  2. A/CKD - Baseline creatinine ~2.0-2.5. Followed by Dr Hollie Salk. - Creatinine stable 2.9>3.14->3.1 ->3.08 ->3.1-> 2.73 -> 2.76 -> 3.27 - Suspect cardiorenal syndrome - Holding nephrotoxic agents - Support cardiac output as needed 3. Probable OSA - Will need sleep study as outpatient - No change.  4. HTN - Has h/o severe HTN. BP OK today.   - Titrate hydral/nitrates for CHF 5.DM2 - Per primary.  6. Severe Deconditioning - PT/OT recommending SNF. No change.  7. Asymptomatic pyuria - UCx with > 100,000 colonies gram negative Rods. Speciation pending.  - Management per primary.   Would hold today and make sure creatinine back down tomorrow for discharge to SNF.   Length of Stay: [address]  Belinda [name]  02/14/2018, 9:19 AM  Advanced Heart Failure Team Pager [phone] (M-F; 7a - 4p)  Please contact [facility] for night-coverage after hours (4p -7a ) and weekends on [email or web address]  Patient seen and examined with the above-signed Advanced Practice Provider  and/or Housestaff. I personally reviewed laboratory data, imaging studies and relevant notes. I independently examined the patient and formulated the important aspects of the plan. I have edited the note to reflect any of my changes or salient points. I have personally discussed the plan with the patient and/or family.  She remains weak. But breathing better. Weight down 40 pounds. No orthopnea or PND. CVP down to 3-5 range (checked personally). Creatinine up. PYP scan reviewed personally and no evidence of TTR amyloid. Urine culture with pansensitive klebsiella.   She is overdiuresed. Will hold diuretics. PYP scan no evidence of TTR amyloid so etiology of cardiomyopathy remains uncertain. Creatinine too high for cath or cMRI. Can consider Myoview.   She is very weak and I remained quite concerned about her prognosis. Plan for SNF when renal function improves.   Belinda Bickers, MD  11:17 PM

## 2018-02-14 NOTE — Progress Notes (Signed)
Clinical Social Worker following patient for support and discharge needs. Patient has chosen [facility] as it is closer to home. CSW reached out to facility to make them aware of patients pending discharge. Facility stated they will start authorization through patients insurance and make CSW aware once Josem Kaufmann has been attained.   Rhea Pink, MSW,  [place]

## 2018-02-14 NOTE — Progress Notes (Signed)
PROGRESS NOTE    Belinda [name]  QPY:[id number] DOB: [birth year] DOA: 02/02/2018 PCP: Reynold Bowen, MD   Brief Narrative: Patient is a 67 year old female with past medical history of systolic heart failure, diabetes type 2, chronic kidney disease stage III-IV who presented to the emergency department with complaints of increased shortness of breath for last few days.  Denies any chest pain.  Reportedly she had not taken her Demadex at home.  Patient was admitted for the management of acute on chronic systolic CHF.  Heart failure team following.  Assessment & Plan:   Active Problems:   Acute systolic (congestive) heart failure (HCC)   Essential hypertension   Acute CHF (congestive heart failure) (HCC)   DM (diabetes mellitus), type 2 with renal complications (HCC)   Pressure injury of skin   Hyperglycemia   Renal failure (ARF), acute on chronic (HCC)  Acute on chronic systolic CHF: Echocardiogram showed reduced systolic function with ejection fraction of 45 to 40%.  Mild diffuse hypokinesis, grade 1 diastolic dysfunction.  Heart failure team following.  D/Ced Lasix drip and metalazone and was started on Torsemide.  [name] is currently on hold due to AKI and CKD.  We will continue to monitor BMP. Cardiology wants to rule out amyloidosis.  Myeloma panel sent but came out to be negative.PYP scan was suggestive of equivocal for transthyretin amyloidosis.  Recommended to consider follow-up imaging in 6 to 12 months.  Equivocal results could represent AL amyloid or early TTR amyloid. She continues to have excellent diuresis .  Peripheral edema seems to be improving.  There is significant decrease in the edema of her  upper extremities.  Right upper extremity edema: Most likely secondary to adjacent IV line. duplex study did not show DVT or SVT.Much improved.  Hypertension: Currently blood pressure stable.  We will continue to monitor.  Coreg discontinued.  AKI on  CKD stage IV: Was  restarted on torsemide.  Renal function worsened today.  Likely from over diuresis?  Dose of torsemide may need to be reconsidered. Entresto on hold.  Avoid nephrotoxic agents.  Hypokalemia/hypomagnesemia: Supplemented.  Obstructive sleep apnea: Sleep study as an outpatient.  Diabetes type 2:  Continue current regimen.  Deconditioning/debility: Physical therapy evaluated and recommended SNF.SW following.  UTI?:Patient denies any dysuria.  Urine culture showed Klebsiella.  Ceftriaxone changed to ciprofloxacin.  DVT prophylaxis: Heparin Anton Ruiz Code Status: Full Family Communication: None present at the bedside Disposition Plan:  SNF tomorrow   Consultants: Heart failure  Procedures: None  Antimicrobials: None  Subjective: Patient seen and examined the bedside this morning.  Complains of generalized weakness and shortness of breath on minimal exertion.  Otherwise looks comfortable. Objective: Vitals:   02/14/18 0000 02/14/18 0400 02/14/18 0656 02/14/18 0902  BP: 131/83 (!) 154/97  (!) 154/96  Pulse: (!) 106 (!) 109  94  Resp: 10 15  15   Temp: 98.6 F (37 C)   98.6 F (37 C)  TempSrc: Oral   Oral  SpO2: 100% 99%  100%  Weight:   73 kg   Height:        Intake/Output Summary (Last 24 hours) at 02/14/2018 1309 Last data filed at 02/14/2018 1119 Gross per 24 hour  Intake 568.78 ml  Output 4300 ml  Net -3731.22 ml   Filed Weights   02/12/18 0350 02/13/18 0500 02/14/18 0656  Weight: 79.1 kg 74.8 kg 73 kg    Examination:  General exam:Not in distress,obese HEENT:PERRL,Oral mucosa moist, Ear/Nose normal on gross exam Respiratory  system: Bilateral decreased air entry on the bases Cardiovascular system: S1 & S2 heard, RRR. No JVD, murmurs, rubs, gallops or clicks.   Gastrointestinal system: Abdomen is nondistended, soft and nontender. No organomegaly or masses felt. Normal bowel sounds heard. Central nervous system: Alert and oriented. No focal neurological  deficits. Extremities: Improving peripheral edema , no clubbing ,no cyanosis, distal peripheral pulses palpable. Skin: No rashes, lesions or ulcers,no icterus ,no pallor MSK: Normal muscle bulk,tone ,power Psychiatry: Judgement and insight appear normal. Mood & affect appropriate.     Data Reviewed: I have personally reviewed following labs and imaging studies  CBC: Recent Labs  Lab 02/14/18 0941  WBC 8.5  HGB 13.6  HCT 39.7  MCV 75.3*  PLT 789*   Basic Metabolic Panel: Recent Labs  Lab 02/08/18 0500 02/09/18 0400 02/10/18 0425 02/11/18 1616 02/12/18 0639 02/13/18 0622 02/14/18 0500  NA 134* 134* 134* 135 136 138 135  K 4.3 4.0 3.8 3.4* 3.2* 3.4* 4.0  CL 101 99 99 97* 95* 93* 91*  CO2 24 26 28 28 31  33* 35*  GLUCOSE 307* 316* 197* 186* 86 88 73  BUN 71* 72* 78* 73* 76* 74* 78*  CREATININE 3.10* 3.08* 3.11* 2.82* 2.73* 2.76* 3.27*  CALCIUM 7.7* 7.9* 8.0* 8.1* 8.3* 8.6* 8.6*  MG 1.6* 1.9  --   --  1.7 1.8  --    GFR: Estimated Creatinine Clearance: 15.1 mL/min (A) (by C-G formula based on SCr of 3.27 mg/dL (H)). Liver Function Tests: No results for input(s): AST, ALT, ALKPHOS, BILITOT, PROT, ALBUMIN in the last 168 hours. No results for input(s): LIPASE, AMYLASE in the last 168 hours. No results for input(s): AMMONIA in the last 168 hours. Coagulation Profile: No results for input(s): INR, PROTIME in the last 168 hours. Cardiac Enzymes: No results for input(s): CKTOTAL, CKMB, CKMBINDEX, TROPONINI in the last 168 hours. BNP (last 3 results) Recent Labs    04/26/17 1246  PROBNP 4,620*   HbA1C: No results for input(s): HGBA1C in the last 72 hours. CBG: Recent Labs  Lab 02/13/18 2106 02/14/18 0557 02/14/18 0613 02/14/18 1112 02/14/18 1113  GLUCAP 133* 35* 140* 68* 66*   Lipid Profile: No results for input(s): CHOL, HDL, LDLCALC, TRIG, CHOLHDL, LDLDIRECT in the last 72 hours. Thyroid Function Tests: No results for input(s): TSH, T4TOTAL, FREET4, T3FREE,  THYROIDAB in the last 72 hours. Anemia Panel: No results for input(s): VITAMINB12, FOLATE, FERRITIN, TIBC, IRON, RETICCTPCT in the last 72 hours. Sepsis Labs: No results for input(s): PROCALCITON, LATICACIDVEN in the last 168 hours.  Recent Results (from the past 240 hour(s))  Culture, Urine     Status: Abnormal   Collection Time: 02/11/18  4:46 PM  Result Value Ref Range Status   Specimen Description URINE, RANDOM  Final   Special Requests   Final    NONE Performed at [facility], [address] [place], [place] [address]    Culture >=100,000 COLONIES/mL KLEBSIELLA PNEUMONIAE (A)  Final   Report Status 02/13/2018 FINAL  Final   Organism ID, Bacteria KLEBSIELLA PNEUMONIAE (A)  Final      Susceptibility   Klebsiella pneumoniae - MIC*    AMPICILLIN RESISTANT Resistant     CEFAZOLIN <=4 SENSITIVE Sensitive     CEFTRIAXONE <=1 SENSITIVE Sensitive     CIPROFLOXACIN <=0.25 SENSITIVE Sensitive     GENTAMICIN <=1 SENSITIVE Sensitive     IMIPENEM <=0.25 SENSITIVE Sensitive     NITROFURANTOIN 32 SENSITIVE Sensitive  TRIMETH/SULFA <=20 SENSITIVE Sensitive     AMPICILLIN/SULBACTAM 4 SENSITIVE Sensitive     PIP/TAZO <=4 SENSITIVE Sensitive     Extended ESBL NEGATIVE Sensitive     * >=100,000 COLONIES/mL KLEBSIELLA PNEUMONIAE         Radiology Studies: Nm Tumor Localization W Spect  Result Date: 02/13/2018 CLINICAL DATA:  HEART FAILURE. CONCERN FOR CARDIAC AMYLOIDOSIS. EXAM: NUCLEAR MEDICINE TUMOR LOCALIZATION. PYP CARDIAC AMYLOIDOSIS SCAN WITH SPECT TECHNIQUE: Following intravenous administration of radiopharmaceutical, anterior planar images of the chest were obtained. Regions of interest were placed on the heart and contralateral chest wall for quantitative assessment. Additional SPECT imaging of the chest was obtained. RADIOPHARMACEUTICALS:  17.5 mCi TECHNETIUM 99 PYROPHOSPHATE FINDINGS: Planar Visual assessment: Anterior planar imaging demonstrates radiotracer uptake within  the heart less than than uptake within the adjacent ribs (Grade 1). Quantitative assessment : Quantitative assessment of the cardiac uptake compared to the contralateral chest wall is equal to 1.02 (H/CL = 1.02). SPECT assessment: SPECT imaging of the chest demonstrates no significant radiotracer accumulation within the LEFT ventricle. IMPRESSION: Visual and quantitative assessment (grade 1, H/CLL equal 1.0 ) are equivocal for transthyretin amyloidosis. Consider follow-up imaging in 6-12 months or as clinically indicated. Of note: A negative or mildly positive PYP does not exclude AL amyloid. In addition, equivocal results could represent AL amyloid or early TTR amyloid Electronically Signed   By: Kerby Moors M.D.   On: 02/13/2018 15:04        Scheduled Meds: . cephALEXin  250 mg Oral Q12H  . ezetimibe-simvastatin  1 tablet Oral Daily  . fluticasone  2 spray Each Nare QHS  . guaiFENesin  1,200 mg Oral BID  . heparin  5,000 Units Subcutaneous Q8H  . hydrALAZINE  12.5 mg Oral Q8H  . insulin aspart  0-15 Units Subcutaneous TID WC  . insulin glargine  10 Units Subcutaneous QHS  . isosorbide mononitrate  15 mg Oral Daily  . loratadine  10 mg Oral Daily  . magnesium oxide  800 mg Oral BID  . nitroGLYCERIN  0.4 mg Sublingual UD  . sodium chloride flush  10-40 mL Intracatheter Q12H   Continuous Infusions: . sodium chloride 10 mL/hr at 02/14/18 0400  . dextrose 5 % and 0.45% NaCl Stopped (02/08/18 1147)     LOS: 12 days    Time spent: 25 mins.More than 50% of that time was spent in counseling and/or coordination of care.      Shelly Coss, MD [facility] Pager [phone]  If 7PM-7AM, please contact night-coverage [email or web address] Password [email or web address] 02/14/2018, 1:09 PM

## 2018-02-14 NOTE — Progress Notes (Signed)
Inpatient Diabetes Program Recommendations  AACE/ADA: New Consensus Statement on Inpatient Glycemic Control (2015)  Target Ranges:  Prepandial:   less than 140 mg/dL      Peak postprandial:   less than 180 mg/dL (1-2 hours)      Critically ill patients:  140 - 180 mg/dL   Lab Results  Component Value Date   GLUCAP 66 (L) 02/14/2018   HGBA1C 12.3 (H) 02/02/2018    Review of Glycemic ControlResults for YAREL, KILCREASE (MRN [id number]) as of 02/14/2018 11:20  Ref. Range 02/13/2018 06:09 02/13/2018 14:40 02/13/2018 16:41 02/13/2018 21:06 02/14/2018 05:57 02/14/2018 06:13 02/14/2018 11:12 02/14/2018 11:13  Glucose-Capillary Latest Ref Range: 70 - 99 mg/dL 107 (H) 78 136 (H) 133 (H) 35 (LL) 140 (H) 68 (L) 66 (L)    Diabetes history: Type 2 DM  Outpatient Diabetes medications: U500 20 units tid with meals Current orders for Inpatient glycemic control:  Novolog moderate tid with meals, Lantus 10 units q HS Inpatient Diabetes Program Recommendations:   Note low blood sugar.  Patient received Lantus 40 units last PM.  Lantus reduced to 10 units q HS.  Will follow.   Thanks,  Adah Perl, RN, BC-ADM Inpatient Diabetes Coordinator Pager [phone] (8a-5p)

## 2018-02-15 LAB — BASIC METABOLIC PANEL
Anion gap: 8 (ref 5–15)
BUN: 83 mg/dL — ABNORMAL HIGH (ref 8–23)
CO2: 35 mmol/L — ABNORMAL HIGH (ref 22–32)
Calcium: 8.1 mg/dL — ABNORMAL LOW (ref 8.9–10.3)
Chloride: 92 mmol/L — ABNORMAL LOW (ref 98–111)
Creatinine, Ser: 3.24 mg/dL — ABNORMAL HIGH (ref 0.44–1.00)
GFR calc Af Amer: 16 mL/min — ABNORMAL LOW (ref >60)
GFR calc non Af Amer: 14 mL/min — ABNORMAL LOW (ref >60)
Glucose, Bld: 202 mg/dL — ABNORMAL HIGH (ref 70–99)
Potassium: 4.1 mmol/L (ref 3.5–5.1)
Sodium: 135 mmol/L (ref 135–145)

## 2018-02-15 LAB — COOXEMETRY PANEL
Carboxyhemoglobin: 1.9 % — ABNORMAL HIGH (ref 0.5–1.5)
Methemoglobin: 1.5 % (ref 0.0–1.5)
O2 Saturation: 72.1 %
Total hemoglobin: 11.6 g/dL — ABNORMAL LOW (ref 12.0–16.0)

## 2018-02-15 LAB — GLUCOSE, CAPILLARY
Glucose-Capillary: 183 mg/dL — ABNORMAL HIGH (ref 70–99)
Glucose-Capillary: 176 mg/dL — ABNORMAL HIGH (ref 70–99)
Glucose-Capillary: 194 mg/dL — ABNORMAL HIGH (ref 70–99)
Glucose-Capillary: 184 mg/dL — ABNORMAL HIGH (ref 70–99)

## 2018-02-15 MED ORDER — INSULIN GLARGINE 100 UNIT/ML ~~LOC~~ SOLN
25.0000 [IU] | Freq: Every day | SUBCUTANEOUS | Status: DC
  Administered 2018-02-15: 25 [IU] via SUBCUTANEOUS
  Filled 2018-02-15 (×2): qty 0.25

## 2018-02-15 NOTE — Progress Notes (Signed)
Inpatient Diabetes Program Recommendations  AACE/ADA: New Consensus Statement on Inpatient Glycemic Control (2015)  Target Ranges:  Prepandial:   less than 140 mg/dL      Peak postprandial:   less than 180 mg/dL (1-2 hours)      Critically ill patients:  140 - 180 mg/dL   Lab Results  Component Value Date   GLUCAP 183 (H) 02/15/2018   HGBA1C 12.3 (H) 02/02/2018    Review of Glycemic Control Results for Belinda [name], Belinda [name] (MRN [id number]) as of 02/15/2018 11:07  Ref. Range 02/14/2018 11:12 02/14/2018 11:13 02/14/2018 16:18 02/14/2018 21:21 02/15/2018 06:25  Glucose-Capillary Latest Ref Range: 70 - 99 mg/dL 68 (L) 66 (L) 126 (H) 271 (H) 183 (H)   Diabetes history: Type 2 DM  Outpatient Diabetes medications: U500 20 units tid with meals Current orders for Inpatient glycemic control:  Novolog moderate tid with meals, Lantus 10 units q HS Inpatient Diabetes Program Recommendations:   May consider increasing Lantus to 25 units q HS (only received Lantus 10 units on 02/14/18).  Patient did have hypoglycemia after receiving Lantus 40 units q HS.    Thanks,  Adah Perl, RN, BC-ADM Inpatient Diabetes Coordinator Pager [phone] (8a-5p)

## 2018-02-15 NOTE — Progress Notes (Signed)
PROGRESS NOTE    ZARIN HAGMANN  XAJ:[id number] DOB: [birth year] DOA: 02/02/2018 PCP: Reynold Bowen, MD   Brief Narrative: Patient is a 67 year old female with past medical history of systolic heart failure, diabetes type 2, chronic kidney disease stage III-IV who presented to the emergency department with complaints of increased shortness of breath for last few days.  Denies any chest pain.  Reportedly she had not taken her Demadex at home.  Patient was admitted for the management of acute on chronic systolic CHF.  Heart failure team following.  Assessment & Plan:   Active Problems:   Acute systolic (congestive) heart failure (HCC)   Essential hypertension   Acute CHF (congestive heart failure) (HCC)   DM (diabetes mellitus), type 2 with renal complications (HCC)   Pressure injury of skin   Hyperglycemia   Renal failure (ARF), acute on chronic (HCC)  Acute on chronic systolic CHF: Echocardiogram showed reduced systolic function with ejection fraction of 45 to 40%.  Mild diffuse hypokinesis, grade 1 diastolic dysfunction.  Heart failure team following.  D/Ced Lasix drip and metalazone and was started on Torsemide.  [name] is currently on hold due to AKI and CKD.  We will continue to monitor BMP. Cardiology wants to rule out amyloidosis.  Myeloma panel sent but came out to be negative.PYP scan was suggestive of equivocal for transthyretin amyloidosis.  Recommended to consider follow-up imaging in 6 to 12 months.  Equivocal results could represent AL amyloid or early TTR amyloid. She continues to have excellent diuresis .  Peripheral edema seems to be improving.  There is significant decrease in the edema of her  upper extremities.  Right upper extremity edema: Most likely secondary to adjacent IV line. duplex study did not show DVT or SVT.Much improved.  Hypertension: Currently blood pressure stable.  We will continue to monitor.  Coreg discontinued.  AKI on  CKD stage IV: Was  restarted on torsemide.  Renal function worse than before at 3.24.  Likely from over diuresis?  Dose of torsemide may need to be reconsidered on dc. Torsemide on hold.Entresto on hold.  Avoid nephrotoxic agents.  Hypokalemia/hypomagnesemia: Supplemented.  Obstructive sleep apnea: Sleep study as an outpatient.  Diabetes type 2:  Continue current regimen.  Deconditioning/debility: Physical therapy evaluated and recommended SNF.SW following.  CNO:[id number] denies any dysuria.  Urine culture showed Klebsiella.  On keflex.  DVT prophylaxis: Heparin Mitchell Code Status: Full Family Communication: None present at the bedside Disposition Plan:  SNF tomorrow   Consultants: Heart failure  Procedures: None  Antimicrobials: None  Subjective: Patient seen and examined the bedside this morning.  Complains of weakness and shortness of breath on minimal exertion.  Hemodynamically stable.   Objective:  Vitals:   02/15/18 0400 02/15/18 0701 02/15/18 0833 02/15/18 1129  BP:   (!) 142/98 129/84  Pulse: (!) 102  (!) 102 (!) 104  Resp: 10   11  Temp: 98.3 F (36.8 C)   98.2 F (36.8 C)  TempSrc: Oral   Oral  SpO2: 100%  100% 100%  Weight:  73.1 kg    Height:        Intake/Output Summary (Last 24 hours) at 02/15/2018 1316 Last data filed at 02/15/2018 1130 Gross per 24 hour  Intake 706.42 ml  Output 2650 ml  Net -1943.58 ml   Filed Weights   02/13/18 0500 02/14/18 0656 02/15/18 0701  Weight: 74.8 kg 73 kg 73.1 kg    Examination:  General exam:Not in distress,obese HEENT:PERRL,Oral mucosa moist,  Ear/Nose normal on gross exam Respiratory system: Bilateral decreased air entry on the bases Cardiovascular system: S1 & S2 heard, RRR. No JVD, murmurs, rubs, gallops or clicks.   Gastrointestinal system: Abdomen is nondistended, soft and nontender. No organomegaly or masses felt. Normal bowel sounds heard. Central nervous system: Alert and oriented. No focal neurological deficits. Extremities:  Improving peripheral edema , no clubbing ,no cyanosis, distal peripheral pulses palpable. Skin: No rashes, lesions or ulcers,no icterus ,no pallor MSK: Normal muscle bulk,tone ,power Psychiatry: Judgement and insight appear normal. Mood & affect appropriate.     Data Reviewed: I have personally reviewed following labs and imaging studies  CBC: Recent Labs  Lab 02/14/18 0941  WBC 8.5  HGB 13.6  HCT 39.7  MCV 75.3*  PLT 856*   Basic Metabolic Panel: Recent Labs  Lab 02/09/18 0400  02/11/18 1616 02/12/18 0639 02/13/18 0622 02/14/18 0500 02/15/18 0441  NA 134*   < > 135 136 138 135 135  K 4.0   < > 3.4* 3.2* 3.4* 4.0 4.1  CL 99   < > 97* 95* 93* 91* 92*  CO2 26   < > 28 31 33* 35* 35*  GLUCOSE 316*   < > 186* 86 88 73 202*  BUN 72*   < > 73* 76* 74* 78* 83*  CREATININE 3.08*   < > 2.82* 2.73* 2.76* 3.27* 3.24*  CALCIUM 7.9*   < > 8.1* 8.3* 8.6* 8.6* 8.1*  MG 1.9  --   --  1.7 1.8  --   --    < > = values in this interval not displayed.   GFR: Estimated Creatinine Clearance: 15.2 mL/min (A) (by C-G formula based on SCr of 3.24 mg/dL (H)). Liver Function Tests: No results for input(s): AST, ALT, ALKPHOS, BILITOT, PROT, ALBUMIN in the last 168 hours. No results for input(s): LIPASE, AMYLASE in the last 168 hours. No results for input(s): AMMONIA in the last 168 hours. Coagulation Profile: No results for input(s): INR, PROTIME in the last 168 hours. Cardiac Enzymes: No results for input(s): CKTOTAL, CKMB, CKMBINDEX, TROPONINI in the last 168 hours. BNP (last 3 results) Recent Labs    04/26/17 1246  PROBNP 4,620*   HbA1C: No results for input(s): HGBA1C in the last 72 hours. CBG: Recent Labs  Lab 02/14/18 1113 02/14/18 1618 02/14/18 2121 02/15/18 0625 02/15/18 1128  GLUCAP 66* 126* 271* 183* 176*   Lipid Profile: No results for input(s): CHOL, HDL, LDLCALC, TRIG, CHOLHDL, LDLDIRECT in the last 72 hours. Thyroid Function Tests: No results for input(s): TSH,  T4TOTAL, FREET4, T3FREE, THYROIDAB in the last 72 hours. Anemia Panel: No results for input(s): VITAMINB12, FOLATE, FERRITIN, TIBC, IRON, RETICCTPCT in the last 72 hours. Sepsis Labs: No results for input(s): PROCALCITON, LATICACIDVEN in the last 168 hours.  Recent Results (from the past 240 hour(s))  Culture, Urine     Status: Abnormal   Collection Time: 02/11/18  4:46 PM  Result Value Ref Range Status   Specimen Description URINE, RANDOM  Final   Special Requests   Final    NONE Performed at [facility], [address], [place], [place] [address]    Culture >=100,000 COLONIES/mL KLEBSIELLA PNEUMONIAE (A)  Final   Report Status 02/13/2018 FINAL  Final   Organism ID, Bacteria KLEBSIELLA PNEUMONIAE (A)  Final      Susceptibility   Klebsiella pneumoniae - MIC*    AMPICILLIN RESISTANT Resistant     CEFAZOLIN <=4 SENSITIVE Sensitive  CEFTRIAXONE <=1 SENSITIVE Sensitive     CIPROFLOXACIN <=0.25 SENSITIVE Sensitive     GENTAMICIN <=1 SENSITIVE Sensitive     IMIPENEM <=0.25 SENSITIVE Sensitive     NITROFURANTOIN 32 SENSITIVE Sensitive     TRIMETH/SULFA <=20 SENSITIVE Sensitive     AMPICILLIN/SULBACTAM 4 SENSITIVE Sensitive     PIP/TAZO <=4 SENSITIVE Sensitive     Extended ESBL NEGATIVE Sensitive     * >=100,000 COLONIES/mL KLEBSIELLA PNEUMONIAE         Radiology Studies: Nm Tumor Localization W Spect  Result Date: 02/13/2018 CLINICAL DATA:  HEART FAILURE. CONCERN FOR CARDIAC AMYLOIDOSIS. EXAM: NUCLEAR MEDICINE TUMOR LOCALIZATION. PYP CARDIAC AMYLOIDOSIS SCAN WITH SPECT TECHNIQUE: Following intravenous administration of radiopharmaceutical, anterior planar images of the chest were obtained. Regions of interest were placed on the heart and contralateral chest wall for quantitative assessment. Additional SPECT imaging of the chest was obtained. RADIOPHARMACEUTICALS:  17.5 mCi TECHNETIUM 99 PYROPHOSPHATE FINDINGS: Planar Visual assessment: Anterior planar imaging demonstrates  radiotracer uptake within the heart less than than uptake within the adjacent ribs (Grade 1). Quantitative assessment : Quantitative assessment of the cardiac uptake compared to the contralateral chest wall is equal to 1.02 (H/CL = 1.02). SPECT assessment: SPECT imaging of the chest demonstrates no significant radiotracer accumulation within the LEFT ventricle. IMPRESSION: Visual and quantitative assessment (grade 1, H/CLL equal 1.0 ) are equivocal for transthyretin amyloidosis. Consider follow-up imaging in 6-12 months or as clinically indicated. Of note: A negative or mildly positive PYP does not exclude AL amyloid. In addition, equivocal results could represent AL amyloid or early TTR amyloid Electronically Signed   By: Kerby Moors M.D.   On: 02/13/2018 15:04        Scheduled Meds: . cephALEXin  250 mg Oral Q12H  . ezetimibe-simvastatin  1 tablet Oral Daily  . fluticasone  2 spray Each Nare QHS  . guaiFENesin  1,200 mg Oral BID  . heparin  5,000 Units Subcutaneous Q8H  . hydrALAZINE  12.5 mg Oral Q8H  . insulin aspart  0-15 Units Subcutaneous TID WC  . insulin glargine  10 Units Subcutaneous QHS  . isosorbide mononitrate  15 mg Oral Daily  . loratadine  10 mg Oral Daily  . magnesium oxide  800 mg Oral BID  . nitroGLYCERIN  0.4 mg Sublingual UD  . sodium chloride flush  10-40 mL Intracatheter Q12H   Continuous Infusions: . sodium chloride 10 mL/hr at 02/15/18 0400  . dextrose 5 % and 0.45% NaCl Stopped (02/08/18 1147)     LOS: 13 days    Time spent: 25 mins.More than 50% of that time was spent in counseling and/or coordination of care.      Shelly Coss, MD [facility] Pager [phone]  If 7PM-7AM, please contact night-coverage [email or web address] Password [email or web address] 02/15/2018, 1:16 PM

## 2018-02-15 NOTE — Progress Notes (Addendum)
Advanced Heart Failure Rounding Note  PCP-Cardiologist: Pixie Casino, MD   Subjective:    Switched to po torsemide 02/13/18. Weight down another 4 lbs. Coox 69.6%.   K 4.1. Cr 3.27 -> 3.24. Holding diuretics. CVP 6-7  Feeling OK this am. Biggest complaint is fatigue and deconditioning. Worried about incontinence when she goes to SNF. Denies SOB or lightheadedness currently.   UA with large leukocytes but no protein. Asymptomatic. Ucx 100K GNR. TRH started Levaquin   Renal u/s. Normal kidneys no hydro.   Echo (8/13) reviewed personally EF 45-50% with mild RV HK. Moderate TR. Small to moderate pericardial effusion echo texture suggestive of possible amyloid.   Myeloma Panel negative.   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   Objective:   Weight Range: 73.1 kg Body mass index is 31.47 kg/m.   Vital Signs:   Temp:  [98.2 F (36.8 C)-98.6 F (37 C)] 98.3 F (36.8 C) (08/21 0400) Pulse Rate:  [94-110] 102 (08/21 0833) Resp:  [10-15] 10 (08/21 0400) BP: (118-154)/(75-98) 142/98 (08/21 0833) SpO2:  [98 %-100 %] 100 % (08/21 0833) Weight:  [73.1 kg] 73.1 kg (08/21 0701) Last BM Date: 02/10/18  Weight change: Filed Weights   02/13/18 0500 02/14/18 0656 02/15/18 0701  Weight: 74.8 kg 73 kg 73.1 kg    Intake/Output:   Intake/Output Summary (Last 24 hours) at 02/15/2018 0837 Last data filed at 02/15/2018 0700 Gross per 24 hour  Intake 746.42 ml  Output 3000 ml  Net -2253.58 ml      Physical Exam   General: Elderly. Lying in bed. NAD.  HEENT: Normal Neck: Supple. JVP 6-7 cm. Carotids 2+ bilat; no bruits. No thyromegaly or nodule noted. Cor: PMI nondisplaced. RRR, 2/6 TR.  Lungs: CTAB, normal effort. Abdomen: Soft, non-tender, non-distended, no HSM. No bruits or masses. +BS  Extremities: No cyanosis, clubbing, or rash. Trace to 1+ edema. RUE PICC. Mild edema. RUE.  Neuro: Alert & orientedx3, cranial nerves grossly intact.  moves all 4 extremities w/o difficulty. Affect pleasant   Telemetry   NSR 90-100s, low voltage, personally reviewed.   Labs    CBC Recent Labs    02/14/18 0941  WBC 8.5  HGB 13.6  HCT 39.7  MCV 75.3*  PLT 673*   Basic Metabolic Panel Recent Labs    02/13/18 0622 02/14/18 0500 02/15/18 0441  NA 138 135 135  K 3.4* 4.0 4.1  CL 93* 91* 92*  CO2 33* 35* 35*  GLUCOSE 88 73 202*  BUN 74* 78* 83*  CREATININE 2.76* 3.27* 3.24*  CALCIUM 8.6* 8.6* 8.1*  MG 1.8  --   --    Liver Function Tests No results for input(s): AST, ALT, ALKPHOS, BILITOT, PROT, ALBUMIN in the last 72 hours. No results for input(s): LIPASE, AMYLASE in the last 72 hours. Cardiac Enzymes No results for input(s): CKTOTAL, CKMB, CKMBINDEX, TROPONINI in the last 72 hours.  BNP: BNP (last 3 results) Recent Labs    02/02/18 1501  BNP >4,500.0*    ProBNP (last 3 results) Recent Labs    04/26/17 1246  PROBNP 4,620*     D-Dimer No results for input(s): DDIMER in the last 72 hours. Hemoglobin A1C No results for input(s): HGBA1C in the last 72 hours. Fasting Lipid Panel No results for input(s): CHOL, HDL, LDLCALC, TRIG, CHOLHDL, LDLDIRECT in the last 72 hours. Thyroid Function Tests No results for input(s): TSH, T4TOTAL, T3FREE, THYROIDAB in the  last 72 hours.  Invalid input(s): FREET3  Other results:   Imaging    No results found.   Medications:     Scheduled Medications: . cephALEXin  250 mg Oral Q12H  . ezetimibe-simvastatin  1 tablet Oral Daily  . fluticasone  2 spray Each Nare QHS  . guaiFENesin  1,200 mg Oral BID  . heparin  5,000 Units Subcutaneous Q8H  . hydrALAZINE  12.5 mg Oral Q8H  . insulin aspart  0-15 Units Subcutaneous TID WC  . insulin glargine  10 Units Subcutaneous QHS  . isosorbide mononitrate  15 mg Oral Daily  . loratadine  10 mg Oral Daily  . magnesium oxide  800 mg Oral BID  . nitroGLYCERIN  0.4 mg Sublingual UD  . sodium chloride flush  10-40 mL  Intracatheter Q12H    Infusions: . sodium chloride 10 mL/hr at 02/15/18 0400  . 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 Debara Pickett for ADHF and massive volume overload.   Assessment/Plan   1. Acute on chronic systolic HF with massive volume overload - Admitted with massive volume overload and cardiorenal syndrome responding poorly to IV lasix with rising creatinine.  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  - EF 2016 was 70% - ECHO 11/18 EF 45-50% Grade IDD RV mildly dilated. Peak PA pressure 43 mm hg.  - ECHO 02/07/18 EF 25-30% - Etiology unclear. Echo suggestive of cardiac amyloid. Ischemia and hypertensive CM also possible. Unable to cath with A/CKD.  - CO-OX 72.1%  - Diuresed significantly. Weight down 40 lbs total.  - CVP 6-7 - Holding diuretics with AKI.  - Renal function stable.  - Off BB with ADHF - No ACE/ARB/ARNI/spiro with A/CKD - Titrate hydral/nitrates - Myeloma panel negative.  - Creatinine too high for cMRI or cath. Could consider myoview once improved from this episode.  2. A/CKD - Baseline creatinine ~2.0-2.5. Followed by Dr Hollie Salk. - Creatinine stable 2.9>3.14->3.1 ->3.08 ->3.1-> 2.73 -> 2.76 -> 3.27 -> 3.24 - Suspect cardiorenal syndrome - Holding nephrotoxic agents - Support cardiac output as needed 3. Probable OSA - Will need sleep study as outpatient - No change.  4. HTN - Has h/o severe HTN. BP OK today.    - Titrate hydral/nitrates for CHF 5.DM2 - Per primary.  6. Severe  Deconditioning - PT/OT recommending SNF. No change.   7. Asymptomatic pyuria - UCx with > 100,000 colonies gram negative Rods.  - + Pansensitive Klebsiella - Per primary.   Holding diuretics with AKI. Cr plateaued. Will need to follow tomorrow am. Recommend holding 1 more day with UTI and AKI, and sending to SNF tomorrow pending labs.    Length of Stay: [address]  Annamaria Helling  02/15/2018, 8:37 AM  Advanced Heart Failure Team Pager [phone] (M-F; 7a - 4p)  Please contact [facility] for night-coverage after hours (4p -7a ) and weekends on [email or web address]  Patient seen and examined with the above-signed Advanced Practice Provider and/or Housestaff. I personally reviewed laboratory data, imaging studies and relevant notes. I independently examined  the patient and formulated the important aspects of the plan. I have edited the note to reflect any of my changes or salient points. I have personally discussed the plan with the patient and/or family.  Overall stable. Volume status looks good off diuretics. Creatinine plateauing. Will restart oral diuretics in am. Would get Myoview prior to d/c to reassess EF and look for ischemic etiology to HF. W/u for amyloid is negative.   Glori Bickers, MD  2:49 PM

## 2018-02-15 NOTE — Progress Notes (Signed)
Clinical Social Worker following patient for support and discharge needs. CSW made MD aware that patient has a bed and authorization through insurance. Per MD patient will discharge tomorrow. Facility is aware of pending discharge tomorrow .   Rhea Pink, MSW,  [place]

## 2018-02-16 ENCOUNTER — Inpatient Hospital Stay (HOSPITAL_COMMUNITY): Payer: Medicare Other

## 2018-02-16 DIAGNOSIS — I5031 Acute diastolic (congestive) heart failure: Secondary | ICD-10-CM

## 2018-02-16 DIAGNOSIS — I509 Heart failure, unspecified: Secondary | ICD-10-CM

## 2018-02-16 LAB — NM MYOCAR MULTI W/SPECT W/WALL MOTION / EF
Estimated workload: 1 METS
Exercise duration (min): 8 min
Exercise duration (sec): 12 s
MPHR: 154 {beats}/min
Peak HR: 118 {beats}/min
Percent HR: 76 %
Rest HR: 103 {beats}/min

## 2018-02-16 LAB — BASIC METABOLIC PANEL
Anion gap: 11 (ref 5–15)
BUN: 76 mg/dL — ABNORMAL HIGH (ref 8–23)
CO2: 34 mmol/L — ABNORMAL HIGH (ref 22–32)
Calcium: 8.6 mg/dL — ABNORMAL LOW (ref 8.9–10.3)
Chloride: 92 mmol/L — ABNORMAL LOW (ref 98–111)
Creatinine, Ser: 2.85 mg/dL — ABNORMAL HIGH (ref 0.44–1.00)
GFR calc Af Amer: 19 mL/min — ABNORMAL LOW (ref >60)
GFR calc non Af Amer: 16 mL/min — ABNORMAL LOW (ref >60)
Glucose, Bld: 112 mg/dL — ABNORMAL HIGH (ref 70–99)
Potassium: 4.1 mmol/L (ref 3.5–5.1)
Sodium: 137 mmol/L (ref 135–145)

## 2018-02-16 LAB — GLUCOSE, CAPILLARY
Glucose-Capillary: 109 mg/dL — ABNORMAL HIGH (ref 70–99)
Glucose-Capillary: 71 mg/dL (ref 70–99)
Glucose-Capillary: 56 mg/dL — ABNORMAL LOW (ref 70–99)
Glucose-Capillary: 65 mg/dL — ABNORMAL LOW (ref 70–99)
Glucose-Capillary: 72 mg/dL (ref 70–99)
Glucose-Capillary: 141 mg/dL — ABNORMAL HIGH (ref 70–99)

## 2018-02-16 LAB — COOXEMETRY PANEL
Carboxyhemoglobin: 2.3 % — ABNORMAL HIGH (ref 0.5–1.5)
Methemoglobin: 1.6 % — ABNORMAL HIGH (ref 0.0–1.5)
O2 Saturation: 71 %
Total hemoglobin: 7.9 g/dL — ABNORMAL LOW (ref 12.0–16.0)

## 2018-02-16 MED ORDER — ONDANSETRON HCL 4 MG/2ML IJ SOLN
4.0000 mg | Freq: Once | INTRAMUSCULAR | Status: AC
  Administered 2018-02-16: 4 mg via INTRAVENOUS

## 2018-02-16 MED ORDER — AMINOPHYLLINE 25 MG/ML IV SOLN
INTRAVENOUS | Status: AC
  Filled 2018-02-16: qty 10

## 2018-02-16 MED ORDER — ONDANSETRON HCL 4 MG/2ML IJ SOLN
INTRAMUSCULAR | Status: AC
  Filled 2018-02-16: qty 2

## 2018-02-16 MED ORDER — TECHNETIUM TC 99M TETROFOSMIN IV KIT
10.0000 | PACK | Freq: Once | INTRAVENOUS | Status: AC | PRN
  Administered 2018-02-16: 10 via INTRAVENOUS

## 2018-02-16 MED ORDER — TORSEMIDE 20 MG PO TABS: ORAL_TABLET | ORAL | 0 refills | Status: DC

## 2018-02-16 MED ORDER — HYDRALAZINE HCL 25 MG PO TABS: 12.5000 mg | ORAL_TABLET | Freq: Three times a day (TID) | ORAL | 0 refills | Status: DC

## 2018-02-16 MED ORDER — ISOSORBIDE MONONITRATE ER 30 MG PO TB24: 15.0000 mg | ORAL_TABLET | Freq: Every day | ORAL | 0 refills | Status: DC

## 2018-02-16 MED ORDER — DEXTROSE 50 % IV SOLN
INTRAVENOUS | Status: AC
  Filled 2018-02-16: qty 50

## 2018-02-16 MED ORDER — REGADENOSON 0.4 MG/5ML IV SOLN
0.4000 mg | Freq: Once | INTRAVENOUS | Status: AC
  Administered 2018-02-16: 0.4 mg via INTRAVENOUS
  Filled 2018-02-16: qty 5

## 2018-02-16 MED ORDER — REGADENOSON 0.4 MG/5ML IV SOLN
INTRAVENOUS | Status: AC
  Filled 2018-02-16: qty 5

## 2018-02-16 MED ORDER — POTASSIUM CHLORIDE ER 20 MEQ PO TBCR: 20.0000 meq | EXTENDED_RELEASE_TABLET | Freq: Every day | ORAL | 0 refills | Status: DC

## 2018-02-16 MED ORDER — MAGNESIUM OXIDE 400 (241.3 MG) MG PO TABS: 800.0000 mg | ORAL_TABLET | Freq: Every day | ORAL | 0 refills | Status: DC

## 2018-02-16 MED ORDER — CARVEDILOL 6.25 MG PO TABS: 6.2500 mg | ORAL_TABLET | Freq: Two times a day (BID) | ORAL | 0 refills | Status: DC

## 2018-02-16 MED ORDER — AMINOPHYLLINE 25 MG/ML IV (NUC MED)
75.0000 mg | Freq: Once | INTRAVENOUS | Status: AC
  Administered 2018-02-16: 75 mg via INTRAVENOUS

## 2018-02-16 NOTE — Progress Notes (Addendum)
Patient will discharge to [facility]. Anticipated discharge date: 02/16/18 Family notified: Oprah Camarena, spouse Transportation by: PTAR  Nurse to call report to [phone]. Patient will go to room 604P at the facility.   CSW signing off.  Estanislado Emms, [place]  Clinical Social Worker

## 2018-02-16 NOTE — Progress Notes (Signed)
Patient started felling extremely ill during stress test after lexiscan given. Patient sweating, naseous, light headed and vomiting. Aminophylinne given to reverse lexican. Zofran ordered for nausea. Patient blood sugar checked and was at 56. D50 ordered. 82ml of D50 given for treatment. Patient now feeling better and not in any distress.

## 2018-02-16 NOTE — Clinical Social Work Placement (Signed)
   CLINICAL SOCIAL WORK PLACEMENT  NOTE  Date:  02/16/2018  Patient Details  Name: Belinda [name] MRN: [id number] Date of Birth: October 20, 1950  Clinical Social Work is seeking post-discharge placement for this patient at the [place] level of care (*CSW will initial, date and re-position this form in  chart as items are completed):  Yes   Patient/family provided with [facility] Work Department's list of facilities offering this level of care within the geographic area requested by the patient (or if unable, by the patient's family).  Yes   Patient/family informed of their freedom to choose among providers that offer the needed level of care, that participate in Medicare, Medicaid or managed care program needed by the patient, have an available bed and are willing to accept the patient.  Yes   Patient/family informed of [facility]'s ownership interest in [facility] and [facility], as well as of the fact that they are under no obligation to receive care at these facilities.  PASRR submitted to EDS on 02/11/18     PASRR number received on 02/11/18     Existing PASRR number confirmed on       FL2 transmitted to all facilities in geographic area requested by pt/family on       FL2 transmitted to all facilities within larger geographic area on       Patient informed that his/her managed care company has contracts with or will negotiate with certain facilities, including the following:  [facility]     Yes   Patient/family informed of bed offers received.  Patient chooses bed at [facility]     Physician recommends and patient chooses bed at      Patient to be transferred to [facility] on 02/16/18.  Patient to be transferred to facility by PTAR     Patient family notified on 02/16/18 of transfer.  Name of family member notified:        PHYSICIAN Please prepare priority discharge summary, including medications, Please prepare  prescriptions     Additional Comment:    _______________________________________________ Estanislado Emms, LCSW 02/16/2018, 2:47 PM

## 2018-02-16 NOTE — Progress Notes (Signed)
Pt discharged via [facility] to [facility]. Pt left with all of her belongings. Telemetry box removed.   Ara Kussmaul BSN, RN

## 2018-02-16 NOTE — Progress Notes (Signed)
Physical Therapy Treatment Patient Details Name: Belinda [name] MRN: [id number] DOB: 06-Sep-1950 Today's Date: 02/16/2018    History of Present Illness Belinda [name] is a 67 y.o F who presented to the ED with SOB and weakness. PMH includes CHF, HTN, HLD, CKD, Orthopnea, F3LK with complications, Acute Renal Failure, and a history of falling.    PT Comments    Pt with steady progress. Pt able to stand with walker and take several steps. Pt reports room spinning with changes in head position. BP stable at 140/97. HR 110's at rest and with activity. Pt also reported this last week. Appears to be vertigo type issue which can be assessed by PT at [facility]    Follow Up Recommendations  SNF;Supervision/Assistance - 24 hour     Equipment Recommendations  None recommended by PT    Recommendations for Other Services       Precautions / Restrictions Precautions Precautions: Fall Precaution Comments: pt reports increased number of falls at home prior to this admission Restrictions Weight Bearing Restrictions: No    Mobility  Bed Mobility Overal bed mobility: Needs Assistance Bed Mobility: Supine to Sit     Supine to sit: Mod assist Sit to supine: Mod assist   General bed mobility comments: Assist to move legs off of bed, elevate trunk into sitting, and bring hips to EOB.  Transfers Overall transfer level: Needs assistance Equipment used: Rolling walker (2 wheeled) Transfers: Sit to/from Stand Sit to Stand: Mod assist;+2 physical assistance;Min assist         General transfer comment: Assist to bring hips up. Pt needs incr time to initiate. Pt with hands on walker to come to stand to encourage anterior weight shift  Ambulation/Gait Ambulation/Gait assistance: +2 physical assistance;Min assist Gait Distance (Feet): 2 Feet Assistive device: Rolling walker (2 wheeled) Gait Pattern/deviations: Step-to pattern;Decreased step length - right;Decreased step length - left;Trunk  flexed Gait velocity: decr Gait velocity interpretation: <1.8 ft/sec, indicate of risk for recurrent falls General Gait Details: Assist for balance and support. Pt with lateral side steps at EOB.   Stairs             Wheelchair Mobility    Modified Rankin (Stroke Patients Only)       Balance Overall balance assessment: Needs assistance Sitting-balance support: Feet supported;No upper extremity supported Sitting balance-Leahy Scale: Fair     Standing balance support: Bilateral upper extremity supported Standing balance-Leahy Scale: Poor Standing balance comment: Stood x 2 with walker x 1 minute with min assist for static standing.                             Cognition Arousal/Alertness: Awake/alert Behavior During Therapy: WFL for tasks assessed/performed;Flat affect Overall Cognitive Status: Within Functional Limits for tasks assessed                                        Exercises      General Comments        Pertinent Vitals/Pain Pain Assessment: No/denies pain    Home Living                      Prior Function            PT Goals (current goals can now be found in the care plan section) Progress towards PT goals: Progressing  toward goals    Frequency    Min 2X/week      PT Plan Current plan remains appropriate    Co-evaluation              AM-PAC PT "6 Clicks" Daily Activity  Outcome Measure  Difficulty turning over in bed (including adjusting bedclothes, sheets and blankets)?: Unable Difficulty moving from lying on back to sitting on the side of the bed? : Unable Difficulty sitting down on and standing up from a chair with arms (e.g., wheelchair, bedside commode, etc,.)?: Unable Help needed moving to and from a bed to chair (including a wheelchair)?: A Lot Help needed walking in hospital room?: A Lot Help needed climbing 3-5 steps with a railing? : Total 6 Click Score: 8    End of Session  Equipment Utilized During Treatment: Gait belt Activity Tolerance: Patient tolerated treatment well Patient left: with call bell/phone within reach;in bed Nurse Communication: Mobility status PT Visit Diagnosis: Unsteadiness on feet (R26.81);Other abnormalities of gait and mobility (R26.89);Repeated falls (R29.6);Muscle weakness (generalized) (M62.81);History of falling (Z91.81);Difficulty in walking, not elsewhere classified (R26.2)     Time: 0233-4356 PT Time Calculation (min) (ACUTE ONLY): 20 min  Charges:  $Therapeutic Activity: 8-22 mins                     [facility] [place] 02/16/2018, 3:23 PM

## 2018-02-16 NOTE — Discharge Summary (Signed)
Physician Discharge Summary  Belinda [name] IHK:[id number] DOB: May 13, 1951 DOA: 02/02/2018  PCP: Reynold Bowen, MD  Admit date: 02/02/2018 Discharge date: 02/16/2018  Admitted From: Home Disposition:  Home  Discharge Condition:Stable CODE STATUS:FULL Diet recommendation: Heart Healthy  Brief/Interim Summary: Patient is a 67 year old female with past medical history of systolic heart failure, diabetes type 2, chronic kidney disease stage III-IV who presented to the emergency department with complaints of increased shortness of breath for last few days.  Denied any chest pain.  Reportedly she had not taken her Demadex at home.  Patient was admitted for the management of acute on chronic systolic CHF.  Heart failure team was following.  She was started on Lasix drip and was also on metolazone for several days. Her CHF significantly improved with diuresis.  She is stable for discharge to skilled nursing facility today.  Following problems were addressed during her hospitalization:   Acute on chronic systolic CHF: Echocardiogram showed reduced systolic function with ejection fraction of 45 to 40%, Mild diffuse hypokinesis, grade 1 diastolic dysfunction.  Heart failure team was following.  D/Ced Lasix drip and metalazone and was started on Torsemide.  [name] will be held due to AKI and CKD.  Cardiology wanted to rule out amyloidosis for her etiology of heart failure.  Myeloma panel sent but came out to be negative.PYP scan was suggestive of equivocal for transthyretin amyloidosis.  Recommended to consider follow-up imaging in 6 to 12 months.  Equivocal results could represent AL amyloid or early TTR amyloid. She continues to have excellent diuresis .  Peripheral edema is improving.  There is significant decrease in the edema of her  upper extremities.  Right upper extremity edema: Most likely secondary to adjacent IV line. duplex study did not show DVT or SVT.Much improved.  Hypertension:  Currently blood pressure stable.    Coreg restarted,.  AKI on  CKD stage IV:  Kidney function slightly higher than the baseline.  She will follow-up with her nephrologist as an outpatient 2 weeks  Hypokalemia/hypomagnesemia: Continue supplementation  Obstructive sleep apnea: Sleep study as an outpatient.  Diabetes type 2:  Continue home regimen.  Deconditioning/debility: Physical therapy evaluated and recommended SNF.SW following.  [name] denies any dysuria.  Urine culture showed Klebsiella.  Was on keflex.    Discharge Diagnoses:  Active Problems:   Acute systolic (congestive) heart failure (HCC)   Essential hypertension   Acute CHF (congestive heart failure) (HCC)   DM (diabetes mellitus), type 2 with renal complications (HCC)   Pressure injury of skin   Hyperglycemia   Renal failure (ARF), acute on chronic [facility])    Discharge Instructions  Discharge Instructions    Diet - low sodium heart healthy   Complete by:  As directed    Discharge instructions   Complete by:  As directed    1) Follow up with cardiology as an outpatient. 2)Take prescribed medication as instructed. 3)Do outpatient sleep study by following with pulmonology.  Name and number of the provider has been attached. 4)Follow your nephrologist in 2 weeks. 5)Do CBC and BMP test in 1 week.   Increase activity slowly   Complete by:  As directed      Allergies as of 02/16/2018      Reactions   Omnicef [cefdinir] Other (See Comments)   GI Upset   Aspirin Other (See Comments)   Can tolerate low aspirin   Augmentin [amoxicillin-pot Clavulanate] Nausea And Vomiting, Other (See Comments)   GI issues   Erythromycin Nausea  And Vomiting, Other (See Comments)   Gi issues   Gabapentin Swelling   Nickel Itching, Rash   Breakouts       Medication List    STOP taking these medications   sacubitril-valsartan 49-51 MG Commonly known as:  ENTRESTO     TAKE these medications   acetaminophen 650 MG  CR tablet Commonly known as:  TYLENOL Take 1,300 mg by mouth every 8 (eight) hours as needed for pain (pain).   ALIVE WOMENS ENERGY PO Take 1 tablet by mouth daily.   aspirin 81 MG tablet Take 81 mg by mouth daily.   Azelastine HCl 0.15 % Soln Place 1 spray into both nostrils 2 (two) times daily.   carvedilol 6.25 MG tablet Commonly known as:  COREG Take 1 tablet (6.25 mg total) by mouth 2 (two) times daily. What changed:    medication strength  how much to take   ezetimibe-simvastatin 10-40 MG tablet Commonly known as:  VYTORIN Take 1 tablet by mouth daily.   ferrous gluconate 324 MG tablet Commonly known as:  FERGON Take 324 mg by mouth daily with breakfast.   fexofenadine 180 MG tablet Commonly known as:  ALLEGRA Take 180 mg by mouth daily.   guaiFENesin 600 MG 12 hr tablet Commonly known as:  MUCINEX Take 600 mg by mouth daily.   HUMULIN R U-500 KWIKPEN 500 UNIT/ML kwikpen Generic drug:  insulin regular human CONCENTRATED Inject 20 Units into the skin 3 (three) times daily with meals.   hydrALAZINE 25 MG tablet Commonly known as:  APRESOLINE Take 0.5 tablets (12.5 mg total) by mouth every 8 (eight) hours.   isosorbide mononitrate 30 MG 24 hr tablet Commonly known as:  IMDUR Take 0.5 tablets (15 mg total) by mouth daily. Start taking on:  02/17/2018   magnesium oxide 400 (241.3 Mg) MG tablet Commonly known as:  MAG-OX Take 2 tablets (800 mg total) by mouth daily.   nitroGLYCERIN 0.4 MG SL tablet Commonly known as:  NITROSTAT Place 0.4 mg under the tongue every 5 (five) minutes as needed for chest pain.   Potassium Chloride ER 20 MEQ Tbcr Take 20 mEq by mouth daily. What changed:    medication strength  when to take this   torsemide 20 MG tablet Commonly known as:  DEMADEX Take 40 mg in the morning and 20 mg in evening What changed:    how much to take  how to take this  when to take this  additional instructions   vitamin C 1000 MG  tablet Take 1,000 mg by mouth at bedtime.   Vitamin D-3 5000 units Tabs Take 5,000 Units by mouth daily.   vitamin E 400 UNIT capsule Take 400 Units by mouth daily.       Contact information for follow-up providers    Chesley Mires, MD. Schedule an appointment as soon as possible for a visit in 4 week(s).   Specialty:  Pulmonary Disease Contact information: [address] [address] [phone]        Reynold Bowen, MD. Schedule an appointment as soon as possible for a visit in 1 week(s).   Specialty:  Endocrinology Contact information: [place] [address] [phone]        Pixie Casino, MD .   Specialty:  Cardiology Contact information: [address] [place] [address] [phone]            Contact information for after-discharge care    Destination  [facility] .   Service:  Skilled Nursing Contact information: [address] [place] [phone]                 Allergies  Allergen Reactions  . Omnicef [Cefdinir] Other (See Comments)    GI Upset  . Aspirin Other (See Comments)    Can tolerate low aspirin  . Augmentin [Amoxicillin-Pot Clavulanate] Nausea And Vomiting and Other (See Comments)    GI issues  . Erythromycin Nausea And Vomiting and Other (See Comments)    Gi issues  . Gabapentin Swelling  . Nickel Itching and Rash    Breakouts     Consultations:  Cardiology   Procedures/Studies: Dg Chest 2 View  Result Date: 02/02/2018 CLINICAL DATA:  67 year old female with a history of shortness of breath and lower extremity swelling for 1 week EXAM: CHEST - 2 VIEW COMPARISON:  12/17/2013 FINDINGS: Cardiomediastinal silhouette is increased as compared to the prior plain film. No pneumothorax. No pleural effusion. Lateral view demonstrates questionable new patchy opacities at the lung base. Low lung volumes. IMPRESSION: New  cardiomegaly compared to the prior plain film of 3 years ago. Patchy airspace opacities at the lung bases, potentially atelectasis and/or consolidation. Correlation with lab values and patient presentation may be useful. Electronically Signed   By: Corrie Mckusick D.O.   On: 02/02/2018 16:07   Nm Tumor Localization W Spect  Result Date: 02/13/2018 CLINICAL DATA:  HEART FAILURE. CONCERN FOR CARDIAC AMYLOIDOSIS. EXAM: NUCLEAR MEDICINE TUMOR LOCALIZATION. PYP CARDIAC AMYLOIDOSIS SCAN WITH SPECT TECHNIQUE: Following intravenous administration of radiopharmaceutical, anterior planar images of the chest were obtained. Regions of interest were placed on the heart and contralateral chest wall for quantitative assessment. Additional SPECT imaging of the chest was obtained. RADIOPHARMACEUTICALS:  17.5 mCi TECHNETIUM 99 PYROPHOSPHATE FINDINGS: Planar Visual assessment: Anterior planar imaging demonstrates radiotracer uptake within the heart less than than uptake within the adjacent ribs (Grade 1). Quantitative assessment : Quantitative assessment of the cardiac uptake compared to the contralateral chest wall is equal to 1.02 (H/CL = 1.02). SPECT assessment: SPECT imaging of the chest demonstrates no significant radiotracer accumulation within the LEFT ventricle. IMPRESSION: Visual and quantitative assessment (grade 1, H/CLL equal 1.0 ) are equivocal for transthyretin amyloidosis. Consider follow-up imaging in 6-12 months or as clinically indicated. Of note: A negative or mildly positive PYP does not exclude AL amyloid. In addition, equivocal results could represent AL amyloid or early TTR amyloid Electronically Signed   By: Kerby Moors M.D.   On: 02/13/2018 15:04   US Renal  Result Date: 02/08/2018 CLINICAL DATA:  Acute on chronic renal failure. History of acute congestive heart failure, chronic kidney disease, hypertension, diabetes. EXAM: RENAL / URINARY TRACT ULTRASOUND COMPLETE COMPARISON:  02/03/2017 FINDINGS:  Right Kidney: Length: 10.7 cm. Echogenicity within normal limits. No mass or hydronephrosis visualized. There is a small amount of fluid around the kidney and liver edge. Left Kidney: Length: 10.7 cm. Echogenicity within normal limits. Small cyst of the upper pole measuring 1.8 cm diameter. No hydronephrosis. Small amount of free fluid around the kidney and spleen edge. Bladder: Bladder is decompressed with a Foley catheter in place. Small bilateral pleural effusions are demonstrated. IMPRESSION: 1. Left renal cyst.  No hydronephrosis in either kidney. 2. Small amount of free fluid in the abdomen, likely ascites. Small bilateral pleural effusions. 3. Foley catheter decompresses the bladder. Electronically Signed   By: Lucienne Capers M.D.   On: 02/08/2018 00:23  Dg Pelvis Portable  Result Date: 02/02/2018 CLINICAL DATA:  Acute onset of pelvic pain. EXAM: PORTABLE PELVIS 1-2 VIEWS COMPARISON:  None. FINDINGS: There is no evidence of fracture or dislocation. Both femoral heads are seated normally within their respective acetabula. Degenerative change is noted at the lower lumbar spine. The sacroiliac joints are unremarkable in appearance. The visualized bowel gas pattern is grossly unremarkable in appearance. IMPRESSION: No evidence of fracture or dislocation. Electronically Signed   By: Garald Balding M.D.   On: 02/02/2018 23:00   [place] Ekg Site Rite  Result Date: 02/06/2018 If Site Rite image not attached, placement could not be confirmed due to current cardiac rhythm.  [place] Ekg Site Rite  Result Date: 02/06/2018 If Site Rite image not attached, placement could not be confirmed due to current cardiac rhythm.      Subjective: Patient seen and examined the bedside this afternoon.  Discussed with heart failure team and  decided to discharge the patient.  She is hemodynamically stable.  Discharge Exam: Vitals:   02/16/18 1030 02/16/18 1206  BP: (!) 179/100 (!) 150/99  Pulse:  (!) 107  Resp:  15   Temp:  98.5 F (36.9 C)  SpO2:  100%   Vitals:   02/16/18 1026 02/16/18 1028 02/16/18 1030 02/16/18 1206  BP: (!) 150/91 (!) 157/104 (!) 179/100 (!) 150/99  Pulse:    (!) 107  Resp:    15  Temp:    98.5 F (36.9 C)  TempSrc:    Oral  SpO2:    100%  Weight:      Height:        General: Pt is alert, awake, not in acute distress Cardiovascular: RRR, S1/S2 +, no rubs, no gallops Respiratory: Biateral basilar crackles Abdominal: Soft, NT, ND, bowel sounds + Extremities: trace edema, no cyanosis    The results of significant diagnostics from this hospitalization (including imaging, microbiology, ancillary and laboratory) are listed below for reference.     Microbiology: Recent Results (from the past 240 hour(s))  Culture, Urine     Status: Abnormal   Collection Time: 02/11/18  4:46 PM  Result Value Ref Range Status   Specimen Description URINE, RANDOM  Final   Special Requests   Final    NONE Performed at [facility], [address] [place], [place] [address]    Culture >=100,000 COLONIES/mL KLEBSIELLA PNEUMONIAE (A)  Final   Report Status 02/13/2018 FINAL  Final   Organism ID, Bacteria KLEBSIELLA PNEUMONIAE (A)  Final      Susceptibility   Klebsiella pneumoniae - MIC*    AMPICILLIN RESISTANT Resistant     CEFAZOLIN <=4 SENSITIVE Sensitive     CEFTRIAXONE <=1 SENSITIVE Sensitive     CIPROFLOXACIN <=0.25 SENSITIVE Sensitive     GENTAMICIN <=1 SENSITIVE Sensitive     IMIPENEM <=0.25 SENSITIVE Sensitive     NITROFURANTOIN 32 SENSITIVE Sensitive     TRIMETH/SULFA <=20 SENSITIVE Sensitive     AMPICILLIN/SULBACTAM 4 SENSITIVE Sensitive     PIP/TAZO <=4 SENSITIVE Sensitive     Extended ESBL NEGATIVE Sensitive     * >=100,000 COLONIES/mL KLEBSIELLA PNEUMONIAE     Labs: BNP (last 3 results) Recent Labs    02/02/18 1501  BNP >2,409.7*   Basic Metabolic Panel: Recent Labs  Lab 02/12/18 0639 02/13/18 0622 02/14/18 0500 02/15/18 0441 02/16/18 0557  NA  136 138 135 135 137  K 3.2* 3.4* 4.0 4.1 4.1  CL 95* 93* 91* 92* 92*  CO2 31 33* 35* 35* 34*  GLUCOSE 86 88 73 202* 112*  BUN 76* 74* 78* 83* 76*  CREATININE 2.73* 2.76* 3.27* 3.24* 2.85*  CALCIUM 8.3* 8.6* 8.6* 8.1* 8.6*  MG 1.7 1.8  --   --   --    Liver Function Tests: No results for input(s): AST, ALT, ALKPHOS, BILITOT, PROT, ALBUMIN in the last 168 hours. No results for input(s): LIPASE, AMYLASE in the last 168 hours. No results for input(s): AMMONIA in the last 168 hours. CBC: Recent Labs  Lab 02/14/18 0941  WBC 8.5  HGB 13.6  HCT 39.7  MCV 75.3*  PLT 445*   Cardiac Enzymes: No results for input(s): CKTOTAL, CKMB, CKMBINDEX, TROPONINI in the last 168 hours. BNP: Invalid input(s): POCBNP CBG: Recent Labs  Lab 02/16/18 0603 02/16/18 0756 02/16/18 1043 02/16/18 1151 02/16/18 1232  GLUCAP 109* 71 56* 65* 72   D-Dimer No results for input(s): DDIMER in the last 72 hours. Hgb A1c No results for input(s): HGBA1C in the last 72 hours. Lipid Profile No results for input(s): CHOL, HDL, LDLCALC, TRIG, CHOLHDL, LDLDIRECT in the last 72 hours. Thyroid function studies No results for input(s): TSH, T4TOTAL, T3FREE, THYROIDAB in the last 72 hours.  Invalid input(s): FREET3 Anemia work up No results for input(s): VITAMINB12, FOLATE, FERRITIN, TIBC, IRON, RETICCTPCT in the last 72 hours. Urinalysis    Component Value Date/Time   COLORURINE YELLOW 02/11/2018 1646   APPEARANCEUR CLOUDY (A) 02/11/2018 1646   LABSPEC 1.005 02/11/2018 1646   PHURINE 5.0 02/11/2018 1646   GLUCOSEU NEGATIVE 02/11/2018 1646   HGBUR LARGE (A) 02/11/2018 1646   BILIRUBINUR NEGATIVE 02/11/2018 1646   KETONESUR NEGATIVE 02/11/2018 1646   PROTEINUR NEGATIVE 02/11/2018 1646   NITRITE NEGATIVE 02/11/2018 1646   LEUKOCYTESUR LARGE (A) 02/11/2018 1646   Sepsis Labs Invalid input(s): PROCALCITONIN,  WBC,  LACTICIDVEN Microbiology Recent Results (from the past 240 hour(s))  Culture, Urine      Status: Abnormal   Collection Time: 02/11/18  4:46 PM  Result Value Ref Range Status   Specimen Description URINE, RANDOM  Final   Special Requests   Final    NONE Performed at [facility], [place] [address] [place], [place] [address]    Culture >=100,000 COLONIES/mL KLEBSIELLA PNEUMONIAE (A)  Final   Report Status 02/13/2018 FINAL  Final   Organism ID, Bacteria KLEBSIELLA PNEUMONIAE (A)  Final      Susceptibility   Klebsiella pneumoniae - MIC*    AMPICILLIN RESISTANT Resistant     CEFAZOLIN <=4 SENSITIVE Sensitive     CEFTRIAXONE <=1 SENSITIVE Sensitive     CIPROFLOXACIN <=0.25 SENSITIVE Sensitive     GENTAMICIN <=1 SENSITIVE Sensitive     IMIPENEM <=0.25 SENSITIVE Sensitive     NITROFURANTOIN 32 SENSITIVE Sensitive     TRIMETH/SULFA <=20 SENSITIVE Sensitive     AMPICILLIN/SULBACTAM 4 SENSITIVE Sensitive     PIP/TAZO <=4 SENSITIVE Sensitive     Extended ESBL NEGATIVE Sensitive     * >=100,000 COLONIES/mL KLEBSIELLA PNEUMONIAE    Please note: You were cared for by a hospitalist during your hospital stay. Once you are discharged, your primary care physician will handle any further medical issues. Please note that NO REFILLS for any discharge medications will be authorized once you are discharged, as it is imperative that you return to your primary care physician (or establish a relationship with a primary care physician if you do not have one) for your post hospital discharge needs so  that they can reassess your need for medications and monitor your lab values.    Time coordinating discharge: 40 minutes  SIGNED:   Shelly Coss, MD  [facility] 02/16/2018, 3:34 PM Pager [phone]  If 7PM-7AM, please contact night-coverage [email or web address] Password [email or web address]

## 2018-02-16 NOTE — Progress Notes (Signed)
   Belinda [name] presented for a nuclear stress test today.  No immediate complications.  Stress imaging is pending at this time.  Preliminary EKG findings may be listed in the chart, but the stress test result will not be finalized until perfusion imaging is complete.  1 day study, [place] to read..  Pt had problems w/ N&V, HA, chest discomfort. Aminophylline & Zofran given, sx resolved.   Rosaria Ferries, PA-C 02/16/2018, 10:35 AM

## 2018-02-16 NOTE — Progress Notes (Addendum)
Advanced Heart Failure Rounding Note  PCP-Cardiologist: Pixie Casino, MD   Subjective:    Weight up 1 lb. Coox 71%.   K 4.1. Cr 3.27 -> 3.24 -> 2.85. Diuretics have been held. CVP 8-9  Feeling OK this am. Ready to go to SNF. Feels slightly dizzy with standing, like room spins slightly. PT in room says this has been issue with her from admission.   EKG with sinus tachycardia and 1st degree AV block.   Myoview read pending. She had N/V, HA, and chest discomfort with lexiscan. Given Aminophylline and Zofran with relief.   UA with large leukocytes but no protein. Asymptomatic. Ucx 100K GNR. TRH started Levaquin   Renal u/s. Normal kidneys no hydro.   Echo (8/13) reviewed personally EF 45-50% with mild RV HK. Moderate TR. Small to moderate pericardial effusion echo texture suggestive of possible amyloid.   Myeloma Panel negative.   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   Objective:   Weight Range: 73.1 kg Body mass index is 31.47 kg/m.   Vital Signs:   Temp:  [98.2 F (36.8 C)-98.4 F (36.9 C)] 98.4 F (36.9 C) (08/22 0758) Pulse Rate:  [102-105] 105 (08/22 0758) Resp:  [11] 11 (08/21 2000) BP: (129-148)/(84-98) 133/95 (08/22 0758) SpO2:  [100 %] 100 % (08/22 0758) Last BM Date: 02/11/18  Weight change: Filed Weights   02/13/18 0500 02/14/18 0656 02/15/18 0701  Weight: 74.8 kg 73 kg 73.1 kg    Intake/Output:   Intake/Output Summary (Last 24 hours) at 02/16/2018 0832 Last data filed at 02/15/2018 2130 Gross per 24 hour  Intake 950 ml  Output 1800 ml  Net -850 ml      Physical Exam   General: Elderly. Lying in bed. NAD.  HEENT: Normal Neck: Supple. JVP 6-7 cm. Carotids 2+ bilat; no bruits. No thyromegaly or nodule noted. Cor: PMI nondisplaced. RRR, 2/6 TR.  Lungs: CTAB, normal effort. Abdomen: Soft, non-tender, non-distended, no HSM. No bruits or masses. +BS  Extremities: No cyanosis, clubbing, or rash. Trace to 1+  edema. RUE PICC. Mild edema. RUE.  Neuro: Alert & orientedx3, cranial nerves grossly intact. moves all 4 extremities w/o difficulty. Affect pleasant   Telemetry   NSR/Sinus tach 90-100s, low voltage, personally reviewed.   Labs    CBC Recent Labs    02/14/18 0941  WBC 8.5  HGB 13.6  HCT 39.7  MCV 75.3*  PLT 546*   Basic Metabolic Panel Recent Labs    02/15/18 0441 02/16/18 0557  NA 135 137  K 4.1 4.1  CL 92* 92*  CO2 35* 34*  GLUCOSE 202* 112*  BUN 83* 76*  CREATININE 3.24* 2.85*  CALCIUM 8.1* 8.6*   Liver Function Tests No results for input(s): AST, ALT, ALKPHOS, BILITOT, PROT, ALBUMIN in the last 72 hours. No results for input(s): LIPASE, AMYLASE in the last 72 hours. Cardiac Enzymes No results for input(s): CKTOTAL, CKMB, CKMBINDEX, TROPONINI in the last 72 hours.  BNP: BNP (last 3 results) Recent Labs    02/02/18 1501  BNP >4,500.0*    ProBNP (last 3 results) Recent Labs    04/26/17 1246  PROBNP 4,620*     D-Dimer No results for input(s): DDIMER in the last 72 hours. Hemoglobin A1C No results for input(s): HGBA1C in the last 72 hours. Fasting Lipid Panel No results for input(s): CHOL, HDL, LDLCALC, TRIG, CHOLHDL, LDLDIRECT in the last 72 hours. Thyroid Function Tests No  results for input(s): TSH, T4TOTAL, T3FREE, THYROIDAB in the last 72 hours.  Invalid input(s): FREET3  Other results:   Imaging    No results found.   Medications:     Scheduled Medications: . cephALEXin  250 mg Oral Q12H  . ezetimibe-simvastatin  1 tablet Oral Daily  . fluticasone  2 spray Each Nare QHS  . guaiFENesin  1,200 mg Oral BID  . heparin  5,000 Units Subcutaneous Q8H  . hydrALAZINE  12.5 mg Oral Q8H  . insulin aspart  0-15 Units Subcutaneous TID WC  . insulin glargine  25 Units Subcutaneous QHS  . isosorbide mononitrate  15 mg Oral Daily  . loratadine  10 mg Oral Daily  . magnesium oxide  800 mg Oral BID  . nitroGLYCERIN  0.4 mg Sublingual UD  .  sodium chloride flush  10-40 mL Intracatheter Q12H    Infusions: . sodium chloride 10 mL/hr at 02/15/18 0400  . dextrose 5 % and 0.45% NaCl 10 mL/hr at 02/16/18 0811    PRN Medications: acetaminophen **OR** acetaminophen, dextrose, diphenhydrAMINE, ondansetron **OR** ondansetron (ZOFRAN) IV, oxyCODONE-acetaminophen, sodium chloride flush    Patient Profile  Belinda S Simmonsis a 67 y.o.femalewith a hx of obesity, chronic combined systolic and diastolic heart failure due to premed NICM (ECHO 11/18 EF 25-30%), DM, HTN, HLD, and CKD stage IIIwho is being seen today for the evaluation of at the request of Dr. Debara Pickett for ADHF and massive volume overload.   Assessment/Plan   1. Acute on chronic systolic HF with massive volume overload - Admitted with massive volume overload and cardiorenal syndrome responding poorly to IV lasix with rising creatinine.  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  - EF 2016 was 70% - ECHO 11/18 EF 45-50% Grade IDD RV mildly dilated. Peak PA pressure 43 mm hg.  - ECHO 02/07/18 EF 25-30% - Etiology unclear. Echo suggestive of cardiac amyloid. Ischemia and hypertensive CM also possible. Unable to cath with A/CKD.  - CO-OX 71.0%.  - Myoview Stress test pending.  - EKG with sinus tachycardia and 1st degree AV block.   - Diuresed significantly. Weight down ~40 lbs total.  - CVP 8-9. Would restart torsemide at 40 mg q am and 20 mg q pm for discharge.  - Off BB with ACHF - No ACE/ARB/ARNI/spiro with A/CKD - Titrate hydral/nitrates - Myeloma panel negative.  - Creatinine too high for cMRI or cath. Have ordered myoview now that stable.   2. A/CKD - Baseline creatinine ~2.0-2.5. Followed by Dr Hollie Salk. - Creatinine stable 2.9>3.14->3.1 ->3.08 ->3.1-> 2.73 -> 2.76 -> 3.27 -> 3.24 -> 2.85 - Suspect cardiorenal syndrome - Holding nephrotoxic agents - Support cardiac output as needed 3. Probable OSA - Will need sleep study as outpatient - No  change.  4. HTN - Has h/o severe HTN. BP OK today.     - Titrate hydral/nitrates for CHF 5.DM2 - Per primary.   6. Severe Deconditioning - PT/OT recommending SNF. No change.    7. Asymptomatic pyuria - UCx with > 100,000 colonies gram negative Rods.  - + Pansensitive Klebsiella - Per primary.   Pt is stable for discharge from a cardiac perspective as of 02/16/18  HF Medication Recommendations for Home: Torsemide 40 mg q am and 20 mg q pm Potassium 20 meq daily for now Coreg 6.25 mg BID (Note decrease from home dose) No ACE/ARB/ARNi for now with AKI.   Follow up as an outpatient will be placed  in AVS.   Length of Stay: [place], [place]  02/16/2018, 8:32 AM  Advanced Heart Failure Team Pager [phone] (M-F; 7a - 4p)  Please contact [facility] for night-coverage after hours (4p -7a ) and weekends on [email or web address]  Patient seen and examined with the above-signed Advanced Practice Provider and/or Housestaff. I personally reviewed laboratory data, imaging studies and relevant notes. I independently examined the patient and formulated the important aspects of the plan. I have edited the note to reflect any of my changes or salient points. I have personally discussed the plan with the patient and/or family.  Volume status much improved. Renal function improving but still high. W/u for amyloid is negative. Myoview scan reviewed personally and notable for mild to moderate anterior ischemia - cannot exclude breast attenuation. Given lack of CP and creatinine ~3 will treat medically. If creatinine improves can consider outpatient cath. Ok for d.c to SNF. Meds as above. No ace/arb/arni with renal failure.   Glori Bickers, MD  9:11 PM

## 2018-02-16 NOTE — Progress Notes (Signed)
Report given to Mr. Margarito Liner, RN at [facility]. All Pt belongings given back to Pt and her husband at bedside.Care plan was explained to Pt and her husband with teach back, they understood and no further questions.All of transferred documentation was completed and put in Pt chart. Waiting for [facility] to transfer.   Kennyth Lose, RN

## 2018-02-24 ENCOUNTER — Encounter (HOSPITAL_COMMUNITY): Payer: Medicare Other

## 2018-04-11 NOTE — Progress Notes (Signed)
Advanced Heart Failure Clinic Note   PCP: Reynold Bowen, MD PCP-Cardiologist: Pixie Casino, MD  Nephrologist: Dr Hollie Salk  HPI: Belinda Linehan Simmonsis a 67 y.o.femalewith a hx ofobesity,chronic combined systolic and diastolic heart failuredue to premed NICM (ECHO 11/18 EF 25-30%), DM, HTN, HLD, and CKD stage III  Admitted 8/8-8/22/19 with volume overload. AHF team consulted. Diuresed 40 lbs with lasix drip and metolazone. Myeloma panel and PYP scan negative. Myoview as below. No LHC with elevated creatinine. She had AKI with creatinine peak 3.1. She was also treated for a UTI. HF meds adjusted as able, limited by CKD. DC weight: 161 lbs  She presents today for post hospital follow up. Overall doing well. She has been out of rehab since 03/08/18. 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 Feels like she is getting stronger. She had more SOB, edema, and cough. She was put back on metolazone 5 mg daily by her nephrologist about 2 weeks ago. She sees her again on Wednesday. Weight has come down 15 lbs since then. Weights 179 > 165 lbs at home. Rare lightheadedness after taking all her medications. SOB is much improved with weight loss. Able to walk around house with walker. Edema much improved, but still present. Sleeps in recliner due to chronic orthopnea. Taking all medications, but missed last night. Eats out for most meals, like [employer] and [employer]. Tries to pick low salt food and does not add salt. SBP 130/70s when [facility] RN checks.   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  Echo (02/07/18) EF 45-50% with mild RV HK. Moderate TR. Small to moderate pericardial effusion echo texture suggestive of possible amyloid.   Myeloma Panel negative.   Myoview scan reviewed by Dr Haroldine Laws and notable for mild to moderate anterior ischemia - cannot exclude breast attenuation. Given lack of CP and creatinine ~3  will treat medically. If creatinine improves can consider outpatient cath.  Review of systems complete and found to be negative unless listed in HPI.   Past Medical History:  Diagnosis Date  . Acute CHF (congestive heart failure) ([place]) 02/02/2018  . Anemia    "get monthly injections to boost my HgB" (02/02/2018)  . Anxiety   . Asthma   . Back pain    intractable low back  . Chronic neck pain   . CKD (chronic kidney disease), stage III ([place])   . Dysrhythmia    palpitations or heart racing periodic. has had for years- Dr Mare Ferrari follows.  [name] GERD (gastroesophageal reflux disease)   . History of blood transfusion 2016   After knee replacemnet  . Hx MRSA infection   . Hyperlipidemia   . Hypertension   . Migraines    "~ 2/month" (02/02/2018)  . Osteoarthritis    "knees, neck" (02/02/2018)  . Palpitations   . Pneumonia    "twice in 2018" (02/02/2018)  . Seasonal allergies   . Type II diabetes mellitus (H[facility])    insulin dependent - fasting 140-200     Current Outpatient Medications  Medication Sig Dispense Refill  . acetaminophen (TYLENOL) 650 MG CR tablet Take 1,300 mg by mouth every 8 (eight) hours as needed for pain (pain).    . Ascorbic Acid (VITAMIN C) 1000 MG tablet Take 1,000 mg by mouth at bedtime.    [name] aspirin 81 MG tablet Take 81 mg by mouth daily.    [name]  Azelastine HCl 0.15 % SOLN Place 1 spray into both nostrils 2 (two) times daily.    . carvedilol (COREG) 25 MG tablet Take 12.5 mg by mouth 2 (two) times daily with a meal.    . Cholecalciferol (VITAMIN D-3) 5000 UNITS TABS Take 5,000 Units by mouth daily.    [name] ezetimibe-simvastatin (VYTORIN) 10-40 MG per tablet Take 1 tablet by mouth daily.    . ferrous gluconate (FERGON) 324 MG tablet Take 324 mg by mouth daily with breakfast.    . fexofenadine (ALLEGRA) 180 MG tablet Take 180 mg by mouth daily.    [name] guaiFENesin (MUCINEX) 600 MG 12 hr tablet Take 600 mg by mouth daily.    [name] HUMULIN R U-500 KWIKPEN 500 UNIT/ML kwikpen Inject  20 Units into the skin 3 (three) times daily with meals.   6  . hydrALAZINE (APRESOLINE) 25 MG tablet Take 25 mg by mouth 3 (three) times daily.    . isosorbide mononitrate (IMDUR) 30 MG 24 hr tablet Take 0.5 tablets (15 mg total) by mouth daily. 30 tablet 0  . metolazone (ZAROXOLYN) 5 MG tablet Take 5 mg by mouth daily.    . Multiple Vitamins-Minerals (ALIVE WOMENS ENERGY PO) Take 1 tablet by mouth daily.    . potassium chloride (K-DUR) 10 MEQ tablet Take 20 mEq by mouth daily.    [name] torsemide (DEMADEX) 20 MG tablet Take 40 mg in the morning and 20 mg in evening 90 tablet 0  . vitamin E 400 UNIT capsule Take 400 Units by mouth daily.    . nitroGLYCERIN (NITROSTAT) 0.4 MG SL tablet Place 0.4 mg under the tongue every 5 (five) minutes as needed for chest pain.   0   No current facility-administered medications for this encounter.    Facility-Administered Medications Ordered in Other Encounters  Medication Dose Route Frequency Provider Last Rate Last Dose  . Darbepoetin Alfa (ARANESP) 200 MCG/0.4ML injection           . Darbepoetin Alfa (ARANESP) injection 200 mcg  200 mcg Subcutaneous Q14 Days Madelon Lips, MD   200 mcg at 04/12/18 1336    Allergies  Allergen Reactions  . Omnicef [Cefdinir] Other (See Comments)    GI Upset  . Aspirin Other (See Comments)    Can tolerate low aspirin  . Augmentin [Amoxicillin-Pot Clavulanate] Nausea And Vomiting and Other (See Comments)    GI issues  . Erythromycin Nausea And Vomiting and Other (See Comments)    Gi issues  . Gabapentin Swelling  . Nickel Itching and Rash    Breakouts       Social History   Socioeconomic History  . Marital status: Married    Spouse name: Not on file  . Number of children: 1  . Years of education: Not on file  . Highest education level: Not on file  Occupational History  . Not on file  Social Needs  . Financial resource strain: Not on file  . Food insecurity:    Worry: Not on file    Inability: Not on  file  . Transportation needs:    Medical: Not on file    Non-medical: Not on file  Tobacco Use  . Smoking status: Never Smoker  . Smokeless tobacco: Never Used  Substance and Sexual Activity  . Alcohol use: Never    Frequency: Never  . Drug use: Never  . Sexual activity: Not Currently  Lifestyle  . Physical activity:    Days per week: Not on file  Minutes per session: Not on file  . Stress: Not on file  Relationships  . Social connections:    Talks on phone: Not on file    Gets together: Not on file    Attends religious service: Not on file    Active member of club or organization: Not on file    Attends meetings of clubs or organizations: Not on file    Relationship status: Not on file  . Intimate partner violence:    Fear of current or ex partner: Not on file    Emotionally abused: Not on file    Physically abused: Not on file    Forced sexual activity: Not on file  Other Topics Concern  . Not on file  Social History Narrative  . Not on file      Family History  Problem Relation Age of Onset  . Heart disease Mother   . Heart failure Father   . Colon cancer Neg Hx   . Stomach cancer Neg Hx   . Esophageal cancer Neg Hx     Vitals:   04/12/18 1444  BP: (!) 168/92  Pulse: 84  SpO2: 98%  Weight: 75.2 kg (165 lb 12.8 oz)     PHYSICAL EXAM: General:  Well appearing. No respiratory difficulty HEENT: normal Neck: supple. JVP to jaw Carotids 2+ bilat; no bruits. No lymphadenopathy or thyromegaly appreciated. Cor: PMI nondisplaced. Regular rate & rhythm. 2/6 TR Lungs: clear Abdomen: soft, nontender, nondistended. No hepatosplenomegaly. No bruits or masses. Good bowel sounds. Extremities: no cyanosis, clubbing, rash, 2+ edema into thighs Neuro: alert & oriented x 3, cranial nerves grossly intact. moves all 4 extremities w/o difficulty. Affect pleasant.   ASSESSMENT & PLAN:  1. Chronic systolic HF. Unclear Etiology.  - Etiology unclear. Echo suggestive of  cardiac amyloid but w/u negative. Ischemia and hypertensive CM also possible. Unable to cath with A/CKD.   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 Myeloma panel negative.  - EF 2016 was 70% - ECHO 11/18 EF 45-50% Grade IDD RV mildly dilated. Peak PA pressure 43 mm hg.  - ECHO 02/07/18 EF 25-30% - Myoview scan reviewed by Dr Haroldine Laws and notable for mild to moderate anterior ischemia - cannot exclude breast attenuation. Given lack of CP and creatinine ~3 will treat medically. If creatinine improves can consider outpatient cath. - NYHA III - Volume status improved but remains elevated. Would like to avoid daily metolazone if possible given severe CKD  -  Increase torsemide to 60 bid. Decrease metolazone to 5 qOD. Can take extra as needed for worsening volume overload. Counseled on need to cut fluid intake back,  - Continue coreg 6.25 mg BID - Continue hydralazine 12.5 mg TID + imdur 30 mg daily - No ACE/ARB/ARNI/spiro with AKI. BMET today - Will need to consider ICD but will wait to see if renal function stabilizes prior to proceeding. If needs HD likely best to proceed with SQ device    2. CKD with recent AKI - Baseline creatinine ~2.0-2.5. Followed by Dr Hollie Salk. Peaked at 3.1 during admission.  - BMET today  3. Probable OSA - Refer for sleep study  4. HTN - Elevated today, but runs SBP 130s at home and at other office visits.   5.DM2 - Per PCP   6. Severe Deconditioning - Now at home with [facility].    Georgiana Shore, NP 04/12/18  Patient seen and examined with the above-signed Advanced Practice Provider and/or Housestaff.  I personally reviewed laboratory data, imaging studies and relevant notes. I independently examined the patient and formulated the important aspects of the plan. I have edited the note to reflect any of my changes or salient points. I have personally discussed the plan with the patient and/or family.  Recently admitted with severe HF and  diuresed 40 pounds. Post-discharge gained 15 pounds rapidly and Renal started metolazone 5 daily. Volume status improving but still elevated. Creatinine stable around 3. NYHA III. Echo concerning for cardiac amyloidosis but w/u negative. Will increase torsemide to 60 bid and attempt to cut metolazone back to 5 qod. Emphasized need to take extra metolazone as needed to avoid worsening fluid overload. We discussed possibility of needing HD in next 6-12 months. Has f/u with [facility] next week. Will eventually need cardiac cath to assess for CAD but will not be able to do until on HD. Check labs today.   Total time personally spent 35 minutes. Over half that time spent discussing above.   Glori Bickers, MD  4:26 PM

## 2018-04-12 ENCOUNTER — Encounter (HOSPITAL_COMMUNITY): Payer: Self-pay

## 2018-04-12 ENCOUNTER — Ambulatory Visit (HOSPITAL_COMMUNITY)
Admission: RE | Admit: 2018-04-12 | Discharge: 2018-04-12 | Disposition: A | Discharge status: Home / Self Care | Payer: Medicare Other | Source: Ambulatory Visit | Attending: Nephrology | Admitting: Nephrology

## 2018-04-12 ENCOUNTER — Ambulatory Visit (HOSPITAL_COMMUNITY)
Admission: RE | Admit: 2018-04-12 | Discharge: 2018-04-12 | Disposition: A | Discharge status: Home / Self Care | Payer: Medicare Other | Source: Ambulatory Visit | Attending: Internal Medicine | Admitting: Internal Medicine

## 2018-04-12 VITALS — BP 143/85 | HR 84 | Temp 98.2°F | Resp 20

## 2018-04-12 VITALS — BP 168/92 | HR 84 | Wt 165.8 lb

## 2018-04-12 DIAGNOSIS — Z8249 Family history of ischemic heart disease and other diseases of the circulatory system: Secondary | ICD-10-CM | POA: Insufficient documentation

## 2018-04-12 DIAGNOSIS — I130 Hypertensive heart and chronic kidney disease with heart failure and stage 1 through stage 4 chronic kidney disease, or unspecified chronic kidney disease: Secondary | ICD-10-CM | POA: Diagnosis not present

## 2018-04-12 DIAGNOSIS — N179 Acute kidney failure, unspecified: Secondary | ICD-10-CM | POA: Diagnosis not present

## 2018-04-12 DIAGNOSIS — Z8614 Personal history of Methicillin resistant Staphylococcus aureus infection: Secondary | ICD-10-CM | POA: Diagnosis not present

## 2018-04-12 DIAGNOSIS — Z794 Long term (current) use of insulin: Secondary | ICD-10-CM | POA: Insufficient documentation

## 2018-04-12 DIAGNOSIS — M1990 Unspecified osteoarthritis, unspecified site: Secondary | ICD-10-CM | POA: Insufficient documentation

## 2018-04-12 DIAGNOSIS — I255 Ischemic cardiomyopathy: Secondary | ICD-10-CM | POA: Insufficient documentation

## 2018-04-12 DIAGNOSIS — N183 Chronic kidney disease, stage 3 unspecified: Secondary | ICD-10-CM

## 2018-04-12 DIAGNOSIS — Z7982 Long term (current) use of aspirin: Secondary | ICD-10-CM | POA: Diagnosis not present

## 2018-04-12 DIAGNOSIS — I10 Essential (primary) hypertension: Secondary | ICD-10-CM | POA: Diagnosis not present

## 2018-04-12 DIAGNOSIS — I5022 Chronic systolic (congestive) heart failure: Secondary | ICD-10-CM | POA: Diagnosis not present

## 2018-04-12 DIAGNOSIS — F419 Anxiety disorder, unspecified: Secondary | ICD-10-CM | POA: Diagnosis not present

## 2018-04-12 DIAGNOSIS — I43 Cardiomyopathy in diseases classified elsewhere: Secondary | ICD-10-CM | POA: Insufficient documentation

## 2018-04-12 DIAGNOSIS — K219 Gastro-esophageal reflux disease without esophagitis: Secondary | ICD-10-CM | POA: Insufficient documentation

## 2018-04-12 DIAGNOSIS — Z79899 Other long term (current) drug therapy: Secondary | ICD-10-CM | POA: Insufficient documentation

## 2018-04-12 DIAGNOSIS — N184 Chronic kidney disease, stage 4 (severe): Secondary | ICD-10-CM

## 2018-04-12 DIAGNOSIS — D631 Anemia in chronic kidney disease: Secondary | ICD-10-CM

## 2018-04-12 DIAGNOSIS — J45909 Unspecified asthma, uncomplicated: Secondary | ICD-10-CM | POA: Insufficient documentation

## 2018-04-12 DIAGNOSIS — E1122 Type 2 diabetes mellitus with diabetic chronic kidney disease: Secondary | ICD-10-CM | POA: Diagnosis not present

## 2018-04-12 DIAGNOSIS — E785 Hyperlipidemia, unspecified: Secondary | ICD-10-CM | POA: Insufficient documentation

## 2018-04-12 LAB — BASIC METABOLIC PANEL
Anion gap: 11 (ref 5–15)
BUN: 94 mg/dL — ABNORMAL HIGH (ref 8–23)
CO2: 29 mmol/L (ref 22–32)
Calcium: 9.6 mg/dL (ref 8.9–10.3)
Chloride: 97 mmol/L — ABNORMAL LOW (ref 98–111)
Creatinine, Ser: 3.02 mg/dL — ABNORMAL HIGH (ref 0.44–1.00)
GFR calc Af Amer: 17 mL/min — ABNORMAL LOW (ref >60)
GFR calc non Af Amer: 15 mL/min — ABNORMAL LOW (ref >60)
Glucose, Bld: 116 mg/dL — ABNORMAL HIGH (ref 70–99)
Potassium: 3.2 mmol/L — ABNORMAL LOW (ref 3.5–5.1)
Sodium: 137 mmol/L (ref 135–145)

## 2018-04-12 LAB — IRON AND TIBC
Iron: 41 ug/dL (ref 28–170)
Saturation Ratios: 13 % (ref 10.4–31.8)
TIBC: 304 ug/dL (ref 250–450)
UIBC: 263 ug/dL

## 2018-04-12 LAB — POCT HEMOGLOBIN-HEMACUE: Hemoglobin: 8.2 g/dL — ABNORMAL LOW (ref 12.0–15.0)

## 2018-04-12 LAB — FERRITIN: Ferritin: 321 ng/mL — ABNORMAL HIGH (ref 11–307)

## 2018-04-12 MED ORDER — DARBEPOETIN ALFA 200 MCG/0.4ML IJ SOSY
200.0000 ug | PREFILLED_SYRINGE | INTRAMUSCULAR | Status: DC
  Administered 2018-04-12: 200 ug via SUBCUTANEOUS

## 2018-04-12 MED ORDER — DARBEPOETIN ALFA 200 MCG/0.4ML IJ SOSY
PREFILLED_SYRINGE | INTRAMUSCULAR | Status: AC
  Filled 2018-04-12: qty 0.4

## 2018-04-12 MED ORDER — TORSEMIDE 20 MG PO TABS: 60.0000 mg | ORAL_TABLET | Freq: Two times a day (BID) | ORAL | 2 refills | Status: DC

## 2018-04-12 MED ORDER — METOLAZONE 5 MG PO TABS: 5.0000 mg | ORAL_TABLET | ORAL | 2 refills | Status: DC

## 2018-04-12 NOTE — Patient Instructions (Addendum)
Today you have been seen at the Heart failure clinic at [facility]   Medication changes: Take Metolazone every other day, can take one as needed for weight gain Increase torsemide 60 mg twice a day  Lab work done today: BMET  We will call you if your lab work is abnormal.. No news is good news!!   Follow up with NP/PA in 2 weeks on 04/26/18 @ 1:30pm Follow up with Dr. Haroldine Laws on 06/19/18 @ 10:40pm   Do the following things EVERYDAY: 1) Weigh yourself in the morning before breakfast. Write it down and keep it in a log. 2) Take your medicines as prescribed 3) Eat low salt foods-Limit salt (sodium) to 2000 mg per day.  4) Stay as active as you can everyday 5) Limit all fluids for the day to less than 2 liters

## 2018-04-13 ENCOUNTER — Other Ambulatory Visit: Payer: Self-pay | Admitting: Endocrinology

## 2018-04-13 DIAGNOSIS — R748 Abnormal levels of other serum enzymes: Secondary | ICD-10-CM

## 2018-04-21 ENCOUNTER — Telehealth (HOSPITAL_COMMUNITY): Payer: Self-pay

## 2018-04-21 ENCOUNTER — Ambulatory Visit
Admission: RE | Admit: 2018-04-21 | Discharge: 2018-04-21 | Disposition: A | Discharge status: Home / Self Care | Payer: Medicare Other | Source: Ambulatory Visit | Attending: Endocrinology | Admitting: Endocrinology

## 2018-04-21 DIAGNOSIS — R748 Abnormal levels of other serum enzymes: Secondary | ICD-10-CM

## 2018-04-21 NOTE — Telephone Encounter (Signed)
Pt called with no answer unable to leave a voice mail, mail box is full

## 2018-04-24 ENCOUNTER — Encounter (HOSPITAL_COMMUNITY): Payer: Self-pay | Admitting: Cardiology

## 2018-04-26 ENCOUNTER — Encounter (HOSPITAL_COMMUNITY): Payer: Self-pay

## 2018-04-26 ENCOUNTER — Encounter (HOSPITAL_COMMUNITY): Payer: Medicare Other

## 2018-04-26 ENCOUNTER — Ambulatory Visit (HOSPITAL_COMMUNITY)
Admission: RE | Admit: 2018-04-26 | Discharge: 2018-04-26 | Disposition: A | Discharge status: Home / Self Care | Payer: Medicare Other | Source: Ambulatory Visit | Attending: Internal Medicine | Admitting: Internal Medicine

## 2018-04-26 VITALS — BP 152/84 | HR 84 | Wt 154.8 lb

## 2018-04-26 DIAGNOSIS — E785 Hyperlipidemia, unspecified: Secondary | ICD-10-CM | POA: Diagnosis not present

## 2018-04-26 DIAGNOSIS — Z794 Long term (current) use of insulin: Secondary | ICD-10-CM | POA: Diagnosis not present

## 2018-04-26 DIAGNOSIS — R600 Localized edema: Secondary | ICD-10-CM

## 2018-04-26 DIAGNOSIS — Z886 Allergy status to analgesic agent status: Secondary | ICD-10-CM | POA: Diagnosis not present

## 2018-04-26 DIAGNOSIS — Z888 Allergy status to other drugs, medicaments and biological substances status: Secondary | ICD-10-CM | POA: Insufficient documentation

## 2018-04-26 DIAGNOSIS — I5042 Chronic combined systolic (congestive) and diastolic (congestive) heart failure: Secondary | ICD-10-CM | POA: Diagnosis present

## 2018-04-26 DIAGNOSIS — I130 Hypertensive heart and chronic kidney disease with heart failure and stage 1 through stage 4 chronic kidney disease, or unspecified chronic kidney disease: Secondary | ICD-10-CM | POA: Insufficient documentation

## 2018-04-26 DIAGNOSIS — I5022 Chronic systolic (congestive) heart failure: Secondary | ICD-10-CM | POA: Diagnosis not present

## 2018-04-26 DIAGNOSIS — Z09 Encounter for follow-up examination after completed treatment for conditions other than malignant neoplasm: Secondary | ICD-10-CM | POA: Insufficient documentation

## 2018-04-26 DIAGNOSIS — Z79899 Other long term (current) drug therapy: Secondary | ICD-10-CM | POA: Diagnosis not present

## 2018-04-26 DIAGNOSIS — F419 Anxiety disorder, unspecified: Secondary | ICD-10-CM | POA: Diagnosis not present

## 2018-04-26 DIAGNOSIS — Z881 Allergy status to other antibiotic agents status: Secondary | ICD-10-CM | POA: Diagnosis not present

## 2018-04-26 DIAGNOSIS — Z8249 Family history of ischemic heart disease and other diseases of the circulatory system: Secondary | ICD-10-CM | POA: Insufficient documentation

## 2018-04-26 DIAGNOSIS — M542 Cervicalgia: Secondary | ICD-10-CM | POA: Diagnosis not present

## 2018-04-26 DIAGNOSIS — I10 Essential (primary) hypertension: Secondary | ICD-10-CM

## 2018-04-26 DIAGNOSIS — K219 Gastro-esophageal reflux disease without esophagitis: Secondary | ICD-10-CM | POA: Diagnosis not present

## 2018-04-26 DIAGNOSIS — Z7982 Long term (current) use of aspirin: Secondary | ICD-10-CM | POA: Diagnosis not present

## 2018-04-26 DIAGNOSIS — I428 Other cardiomyopathies: Secondary | ICD-10-CM | POA: Diagnosis not present

## 2018-04-26 DIAGNOSIS — R188 Other ascites: Secondary | ICD-10-CM | POA: Insufficient documentation

## 2018-04-26 DIAGNOSIS — N184 Chronic kidney disease, stage 4 (severe): Secondary | ICD-10-CM

## 2018-04-26 DIAGNOSIS — Z880 Allergy status to penicillin: Secondary | ICD-10-CM | POA: Diagnosis not present

## 2018-04-26 DIAGNOSIS — E1122 Type 2 diabetes mellitus with diabetic chronic kidney disease: Secondary | ICD-10-CM | POA: Insufficient documentation

## 2018-04-26 DIAGNOSIS — R002 Palpitations: Secondary | ICD-10-CM

## 2018-04-26 DIAGNOSIS — E669 Obesity, unspecified: Secondary | ICD-10-CM | POA: Diagnosis not present

## 2018-04-26 DIAGNOSIS — N183 Chronic kidney disease, stage 3 (moderate): Secondary | ICD-10-CM | POA: Diagnosis not present

## 2018-04-26 MED ORDER — HYDRALAZINE HCL 50 MG PO TABS: 50.0000 mg | ORAL_TABLET | Freq: Three times a day (TID) | ORAL | 3 refills | Status: DC

## 2018-04-26 NOTE — Progress Notes (Signed)
Advanced Heart Failure Clinic Note   PCP: Reynold Bowen, MD PCP-Cardiologist: Pixie Casino, MD  Nephrologist: Dr Hollie Salk  HPI: Belinda [name] is a 67 y.o. 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 AHF team consulted. Diuresed 40 lbs with lasix drip and metolazone. Myeloma panel and PYP scan negative. Myoview as below. No LHC with elevated creatinine. She had AKI with creatinine peak 3.1. She was also treated for a UTI. HF meds adjusted as able, limited by CKD. DC weight: 161 lbs  She presents today for 2 week follow up. Last visit, torsemide increased, and metolazone decreased to every other day. Weight  Down 9 lbs. Feeling somewhat better, but remains fatigue. Her BP has been running high. She states gets as high as 170-180s at home. HHPT limited by HTN. She states when in SNF, she was on a brand of imdur that worked much better for her, but at her pharmacy they do not have this brand. She is able to walk around the house better now due to weight loss. Uses walker. Sleeps in recliner chronically due to orthopnea. Taking all medications as directed. Saw Dr. Hollie Salk last week. Was told Cr improved from 3.0 to 2.6.  ABD [place] 04/21/18 with ? Elevated R heart pressures with distended IVC and hepatic veins. Small amount of perihepatic ascites and R medical renal disease.   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  Echo (02/07/18) EF 45-50% with mild RV HK. Moderate TR. Small to moderate pericardial effusion echo texture suggestive of possible amyloid.   Myeloma Panel negative.   Myoview scan reviewed by Dr Haroldine Laws and notable for mild to moderate anterior ischemia - cannot exclude breast attenuation. Given lack of CP and creatinine ~3  will treat medically. If creatinine improves can consider outpatient cath.  Review of systems complete and found to be negative unless listed in HPI.    Past Medical History:  Diagnosis Date  . Acute CHF (congestive heart failure) ([place]) 02/02/2018  . Anemia    "get monthly injections to boost my HgB" (02/02/2018)  . Anxiety   . Asthma   . Back pain    intractable low back  . Chronic neck pain   . CKD (chronic kidney disease), stage III ([place])   . Dysrhythmia    palpitations or heart racing periodic. has had for years- Dr Mare Ferrari follows.  [name] GERD (gastroesophageal reflux disease)   . History of blood transfusion 2016   After knee replacemnet  . Hx MRSA infection   . Hyperlipidemia   . Hypertension   . Migraines    "~ 2/month" (02/02/2018)  . Osteoarthritis    "knees, neck" (02/02/2018)  . Palpitations   . Pneumonia    "twice in 2018" (02/02/2018)  . Seasonal allergies   . Type II diabetes mellitus ([facility])    insulin dependent - fasting 140-200     Current Outpatient Medications  Medication Sig Dispense Refill  . acetaminophen (TYLENOL) 650 MG CR tablet Take 1,300 mg by mouth every 8 (eight) hours as needed for pain (pain).    . Ascorbic Acid (VITAMIN C) 1000 MG tablet Take 1,000 mg by mouth at bedtime.    [name] aspirin 81 MG tablet Take 81 mg  by mouth daily.    . Azelastine HCl 0.15 % SOLN Place 1 spray into both nostrils 2 (two) times daily.    . carvedilol (COREG) 25 MG tablet Take 12.5 mg by mouth 2 (two) times daily with a meal.    . Cholecalciferol (VITAMIN D-3) 5000 UNITS TABS Take 5,000 Units by mouth daily.    [name] ezetimibe-simvastatin (VYTORIN) 10-40 MG per tablet Take 1 tablet by mouth daily.    . ferrous gluconate (FERGON) 324 MG tablet Take 324 mg by mouth daily with breakfast.    . fexofenadine (ALLEGRA) 180 MG tablet Take 180 mg by mouth daily.    [name] guaiFENesin (MUCINEX) 600 MG 12 hr tablet Take 600 mg by mouth daily.    [name] HUMULIN R U-500 KWIKPEN 500 UNIT/ML kwikpen  Inject 20 Units into the skin 3 (three) times daily with meals.   6  . hydrALAZINE (APRESOLINE) 25 MG tablet Take 25 mg by mouth 3 (three) times daily.    [name] LANTUS SOLOSTAR 100 UNIT/ML Solostar Pen INJECT 12 UNITS AT NIGHT  3  . Multiple Vitamins-Minerals (ALIVE WOMENS ENERGY PO) Take 1 tablet by mouth daily.    . nitroGLYCERIN (NITROSTAT) 0.4 MG SL tablet Place 0.4 mg under the tongue every 5 (five) minutes as needed for chest pain.   0  . potassium chloride (K-DUR) 10 MEQ tablet Take 20 mEq by mouth daily.    [name] torsemide (DEMADEX) 20 MG tablet Take 3 tablets (60 mg total) by mouth 2 (two) times daily. Take 40 mg in the morning and 20 mg in evening 180 tablet 2  . vitamin E 400 UNIT capsule Take 400 Units by mouth daily.    . isosorbide mononitrate (IMDUR) 30 MG 24 hr tablet Take 0.5 tablets (15 mg total) by mouth daily. 30 tablet 0  . metolazone (ZAROXOLYN) 5 MG tablet Take 1 tablet (5 mg total) by mouth every other day. Take 5mg  every other day, If you need it for weight gain you can take one on your off day 30 tablet 2   No current facility-administered medications for this encounter.     Allergies  Allergen Reactions  . Omnicef [Cefdinir] Other (See Comments)    GI Upset  . Aspirin Other (See Comments)    Can tolerate low aspirin  . Augmentin [Amoxicillin-Pot Clavulanate] Nausea And Vomiting and Other (See Comments)    GI issues  . Erythromycin Nausea And Vomiting and Other (See Comments)    Gi issues  . Gabapentin Swelling  . Nickel Itching and Rash    Breakouts       Social History   Socioeconomic History  . Marital status: Married    Spouse name: Not on file  . Number of children: 1  . Years of education: Not on file  . Highest education level: Not on file  Occupational History  . Not on file  Social Needs  . Financial resource strain: Not on file  . Food insecurity:    Worry: Not on file    Inability: Not on file  . Transportation needs:    Medical: Not on file     Non-medical: Not on file  Tobacco Use  . Smoking status: Never Smoker  . Smokeless tobacco: Never Used  Substance and Sexual Activity  . Alcohol use: Never    Frequency: Never  . Drug use: Never  . Sexual activity: Not Currently  Lifestyle  . Physical activity:    Days per week: Not on  file    Minutes per session: Not on file  . Stress: Not on file  Relationships  . Social connections:    Talks on phone: Not on file    Gets together: Not on file    Attends religious service: Not on file    Active member of club or organization: Not on file    Attends meetings of clubs or organizations: Not on file    Relationship status: Not on file  . Intimate partner violence:    Fear of current or ex partner: Not on file    Emotionally abused: Not on file    Physically abused: Not on file    Forced sexual activity: Not on file  Other Topics Concern  . Not on file  Social History Narrative  . Not on file      Family History  Problem Relation Age of Onset  . Heart disease Mother   . Heart failure Father   . Colon cancer Neg Hx   . Stomach cancer Neg Hx   . Esophageal cancer Neg Hx    Vitals:   04/26/18 1333  BP: (!) 152/84  Pulse: 84  SpO2: 96%  Weight: 70.2 kg (154 lb 12.8 oz)   Wt Readings from Last 3 Encounters:  04/26/18 70.2 kg (154 lb 12.8 oz)  04/12/18 75.2 kg (165 lb 12.8 oz)  02/15/18 73.1 kg (161 lb 2.5 oz)    PHYSICAL EXAM: General: Well appearing. No resp difficulty. HEENT: Normal Neck: Supple. JVP difficult, but appears at least mildly elevated.. Carotids 2+ bilat; no bruits. No thyromegaly or nodule noted. Cor: PMI nondisplaced. RRR, 2/6 TR Lungs: CTAB, normal effort. Abdomen: Soft, non-tender, non-distended, no HSM. No bruits or masses. +BS  Extremities: No cyanosis, clubbing, or rash. 1-2+ ankle edema.  Neuro: Alert & orientedx3, cranial nerves grossly intact. moves all 4 extremities w/o difficulty. Affect pleasant   ASSESSMENT & PLAN:  1.  Chronic systolic HF. Unclear Etiology.  - Etiology unclear. Echo suggestive of cardiac amyloid but w/u negative. Ischemia and hypertensive CM also possible. Unable to cath with A/CKD.   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 Myeloma panel negative.  - EF 2016 was 70% - ECHO 11/18 EF 45-50% Grade IDD RV mildly dilated. Peak PA pressure 43 mm hg.  - ECHO 02/07/18 EF 25-30% - Myoview scan reviewed by Dr Haroldine Laws and notable for mild to moderate anterior ischemia - cannot exclude breast attenuation. Given lack of CP and creatinine ~3 will treat medically. If creatinine improves can consider outpatient cath. - NYHA III symptoms chronically. - Volume status somewhat elevated on exam.   - Continue torsemide 60 mg BID - Continue metolazone 5 mg. She was instructed to take every other day, but has cut back even further trying to protect her kidneys. She is somewhat more SOB today and has ++ peripheral edema. Encouraged to take today. Suspect she will continue to need at least 1-2 times a week.  - Continue coreg 6.25 mg BID - Increase hydralazine to 50 mg TID - Continue imdur 30 mg daily - No ACE/ARB/ARNI/spiro with AKI. BMET today - Will need to consider ICD but will wait to see if renal function stabilizes prior to proceeding. If needs HD likely best to proceed with SQ device   - Reinforced fluid restriction to < 2 L daily, sodium restriction to less than 2000 mg daily, and the importance of daily weights.   2. CKD with recent AKI -  Baseline creatinine ~2.0-2.5. Followed by Dr Hollie Salk. Peaked at 3.1 during admission.  - BMET today 3. Probable OSA - Refer for sleep study 4. HTN - Meds as above.  5.DM2 - Per PCP  6. Severe Deconditioning - Continue HH PT  Meds as above. RTC 2 weeks for Pharm-D visit for med titration. Keep 2 month follow up with MD. Sooner with symptoms.   Shirley Friar, PA-C 04/26/18   Greater than 50% of the 25 minute visit was spent in  counseling/coordination of care regarding disease state education, salt/fluid restriction, sliding scale diuretics, and medication compliance.

## 2018-04-26 NOTE — Patient Instructions (Addendum)
INCREASE Hydralazine to 50mg , one tab three times per day  Your physician recommends that you schedule a follow-up appointment in: 2 weeks with the [facility] Pharmacist  Keep your follow up as scheduled with Dr Haroldine Laws in December   Do the following things EVERYDAY: 1) Weigh yourself in the morning before breakfast. Write it down and keep it in a log. 2) Take your medicines as prescribed 3) Eat low salt foods-Limit salt (sodium) to 2000 mg per day.  4) Stay as active as you can everyday 5) Limit all fluids for the day to less than 2 liters

## 2018-05-08 ENCOUNTER — Other Ambulatory Visit (HOSPITAL_COMMUNITY): Payer: Medicare Other

## 2018-05-10 ENCOUNTER — Ambulatory Visit (HOSPITAL_COMMUNITY)
Admission: RE | Admit: 2018-05-10 | Discharge: 2018-05-10 | Disposition: A | Discharge status: Home / Self Care | Payer: Medicare Other | Source: Ambulatory Visit | Attending: Nephrology | Admitting: Nephrology

## 2018-05-10 ENCOUNTER — Ambulatory Visit (HOSPITAL_COMMUNITY)
Admission: RE | Admit: 2018-05-10 | Discharge: 2018-05-10 | Disposition: A | Discharge status: Home / Self Care | Payer: Medicare Other | Source: Ambulatory Visit | Attending: Internal Medicine | Admitting: Internal Medicine

## 2018-05-10 VITALS — BP 138/87 | HR 85 | Temp 97.8°F | Resp 20

## 2018-05-10 VITALS — BP 142/90 | HR 85 | Wt 150.8 lb

## 2018-05-10 DIAGNOSIS — N183 Chronic kidney disease, stage 3 unspecified: Secondary | ICD-10-CM

## 2018-05-10 DIAGNOSIS — I5022 Chronic systolic (congestive) heart failure: Secondary | ICD-10-CM | POA: Insufficient documentation

## 2018-05-10 DIAGNOSIS — D631 Anemia in chronic kidney disease: Secondary | ICD-10-CM | POA: Diagnosis present

## 2018-05-10 LAB — BASIC METABOLIC PANEL
Anion gap: 13 (ref 5–15)
BUN: 65 mg/dL — ABNORMAL HIGH (ref 8–23)
CO2: 23 mmol/L (ref 22–32)
Calcium: 9.1 mg/dL (ref 8.9–10.3)
Chloride: 102 mmol/L (ref 98–111)
Creatinine, Ser: 2.74 mg/dL — ABNORMAL HIGH (ref 0.44–1.00)
GFR calc Af Amer: 20 mL/min — ABNORMAL LOW (ref >60)
GFR calc non Af Amer: 17 mL/min — ABNORMAL LOW (ref >60)
Glucose, Bld: 226 mg/dL — ABNORMAL HIGH (ref 70–99)
Potassium: 3.9 mmol/L (ref 3.5–5.1)
Sodium: 138 mmol/L (ref 135–145)

## 2018-05-10 LAB — IRON AND TIBC
Iron: 50 ug/dL (ref 28–170)
Saturation Ratios: 16 % (ref 10.4–31.8)
TIBC: 311 ug/dL (ref 250–450)
UIBC: 261 ug/dL

## 2018-05-10 LAB — FERRITIN: Ferritin: 291 ng/mL (ref 11–307)

## 2018-05-10 MED ORDER — DARBEPOETIN ALFA 200 MCG/0.4ML IJ SOSY
200.0000 ug | PREFILLED_SYRINGE | INTRAMUSCULAR | Status: DC
  Administered 2018-05-10: 200 ug via SUBCUTANEOUS

## 2018-05-10 MED ORDER — METOLAZONE 5 MG PO TABS: 5.0000 mg | ORAL_TABLET | ORAL | 5 refills | Status: DC

## 2018-05-10 MED ORDER — DARBEPOETIN ALFA 200 MCG/0.4ML IJ SOSY
PREFILLED_SYRINGE | INTRAMUSCULAR | Status: AC
  Administered 2018-05-10: 200 ug via SUBCUTANEOUS
  Filled 2018-05-10: qty 0.4

## 2018-05-10 MED ORDER — TORSEMIDE 20 MG PO TABS: 40.0000 mg | ORAL_TABLET | Freq: Two times a day (BID) | ORAL | 5 refills | Status: DC

## 2018-05-10 NOTE — Progress Notes (Signed)
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 AHF team consulted. Diuresed 40 lbs with lasix drip and metolazone. Myeloma panel and PYP scan negative. Myoview as below. No LHC with elevated creatinine. She had AKI with creatinine peak 3.1. She was also treated for a UTI. HF meds adjusted as able, limited by CKD. DC weight: 161 lbs  ABD [place] 04/21/18 with Belinda [name]/Dr. Reynold Bowen. Elevated R heart pressures with distended IVC and hepatic veins. Small amount of perihepatic ascites and R medical renal disease.  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  Echo (02/07/18) EF 45-50% with mild RV HK. Moderate TR. Small to moderate pericardial effusion echo texture suggestive of possible amyloid.   Myeloma Panel negative.   Myoview scan reviewed by Dr Haroldine Laws and notable for mild to moderate anterior ischemia - cannot exclude breast attenuation. Given lack of CP and creatinine ~3 will treat medically. If creatinine improves can consider outpatient cath.  She returns today for pharmacist-led HF medication titration. At last HF clinic visit on 10/30, her hydralazine was increased to 50 mg TID. Weight today is down 4 lbs. She remains fatigued and short of breath, but reports breathing improves with symbicort use. She only uses her metolazone PRN when she notices her thighs/legs are swollen. She took the metolazone 3 times last week, but says it's hard on her kidneys and she does not like her dry mouth when she takes it. Reports taking KCl 10 mEq TID for the past two weeks. She says she gets dizzy at times, but not too often. She has nausea and has vomited her medications up in the morning  a few times.    Shortness of breath/dyspnea on exertion? Yes - but better   Orthopnea/PND? Yes - stable  Edema? Yes - 2+ pitting edema  Lightheadedness/dizziness? Yes - every once in a while  Daily weights at home? Yes - slowly trending down with 3 days of metolazone   Blood pressure/heart rate monitoring at home? Yes - usually "normal" unless she vomits her meds  Following low-sodium/fluid-restricted diet? yes  HF Medications: Carvedilol 12.5 mg BID Hydralazine 50 mg TID Isosorbide mononitrate 15 mg daily Torsemide 40 mg QAM/20 mg QPM Metolazone 5 mg PRN for thigh swelling  Has the patient been experiencing any side effects to the medications prescribed?  no  Does the patient have any problems obtaining medications due to transportation or finances?   No - [employer] Medicare  Understanding of regimen: fair Understanding of indications: fair Potential of compliance: poor Patient understands to avoid NSAIDs. Patient understands to avoid decongestants.   Pertinent Lab Values:  05/10/18: Ser;um creatinine 2.74, BUN 65, Potassium 3.9, Sodium 138  04/12/18: Serum creatinine 3.02, BUN 94, Potassium 3.2, Sodium 137  Vital Signs:  Weight: 150.8 lb (last clinic weight: 154.8 lb)  Blood pressure: 142/90 mmHg  Heart rate: 85 bpm     ReDS Vest: 55%  Assessment: 1. Chronicsystolic CHF (EF 19-50%>>93-26%) of unclear etiology. NYHA class IIIsymptoms. - Volume status overloaded from physical exam, continued SOB, and ReDS vest reading.  - Increase torsemide to 40 mg BID and metolazone 5 mg QOD. - Continue carvedilol 12.5 mg BID, Hydralazine 50  mg TID, and Isosorbide mononitrate 15 mg daily. - No ACE/ARB/ARNI/spiro with AKI. - Will need to consider ICD but will wait to see if renal function stabilizes prior to proceeding. If needs HD likely best to proceed with SQ device - Basic disease state pathophysiology, medication indication, mechanism and side effects reviewed  at length with patient and he verbalized understanding  2. CKD with recent AKI - Baseline creatinine ~2.0-2.5. Followed by Dr Hollie Salk. Peaked at 3.1 during admission.  - BMET today  3. Probable OSA - Refer for sleep study  4. HTN -Meds as above.  5.DM2 -Per PCP  6. Severe Deconditioning -Continue HH PT  Plan: 1) Medication changes: Based on clinical presentation, vital signs and recent labs will increase torsemide to 40 mg BID and metolazone 5 mg QOD 2) Labs: today and next follow-up visit 3) Follow-up: Dr. Haroldine Laws on 12/23   Ruta Hinds. Velva Harman, PharmD, BCPS, CPP Clinical Pharmacist Phone: [phone] 05/08/2018 3:05 PM

## 2018-05-10 NOTE — Patient Instructions (Signed)
Please INCREASE your metolazone to 1 tablet (5 mg) EVERY OTHER DAY.   Please INCREASE your torsemide to 2 tablets (40 mg) TWICE DAILY.   Blood work today. We will call you with any changes.   Please keep your appointment with Dr. Haroldine Laws on 06/19/18.

## 2018-05-11 LAB — POCT HEMOGLOBIN-HEMACUE: Hemoglobin: 8 g/dL — ABNORMAL LOW (ref 12.0–15.0)

## 2018-05-15 ENCOUNTER — Ambulatory Visit (HOSPITAL_COMMUNITY): Payer: Medicare Other | Attending: Cardiology

## 2018-05-15 ENCOUNTER — Other Ambulatory Visit: Payer: Self-pay

## 2018-05-15 DIAGNOSIS — I509 Heart failure, unspecified: Secondary | ICD-10-CM | POA: Diagnosis not present

## 2018-05-24 ENCOUNTER — Encounter (HOSPITAL_COMMUNITY): Payer: Medicare Other

## 2018-06-07 ENCOUNTER — Other Ambulatory Visit (HOSPITAL_COMMUNITY): Payer: Self-pay | Admitting: *Deleted

## 2018-06-08 ENCOUNTER — Ambulatory Visit (HOSPITAL_COMMUNITY)
Admission: RE | Admit: 2018-06-08 | Discharge: 2018-06-08 | Disposition: A | Discharge status: Home / Self Care | Payer: Medicare Other | Source: Ambulatory Visit | Attending: Nephrology | Admitting: Nephrology

## 2018-06-08 VITALS — BP 137/84 | HR 83 | Temp 98.6°F | Resp 20 | Ht 60.0 in | Wt 140.0 lb

## 2018-06-08 DIAGNOSIS — N183 Chronic kidney disease, stage 3 unspecified: Secondary | ICD-10-CM

## 2018-06-08 DIAGNOSIS — D631 Anemia in chronic kidney disease: Secondary | ICD-10-CM | POA: Insufficient documentation

## 2018-06-08 LAB — IRON AND TIBC
Iron: 84 ug/dL (ref 28–170)
Saturation Ratios: 24 % (ref 10.4–31.8)
TIBC: 353 ug/dL (ref 250–450)
UIBC: 269 ug/dL

## 2018-06-08 LAB — FERRITIN: Ferritin: 314 ng/mL — ABNORMAL HIGH (ref 11–307)

## 2018-06-08 LAB — POCT HEMOGLOBIN-HEMACUE: Hemoglobin: 9.7 g/dL — ABNORMAL LOW (ref 12.0–15.0)

## 2018-06-08 MED ORDER — DARBEPOETIN ALFA 200 MCG/0.4ML IJ SOSY
200.0000 ug | PREFILLED_SYRINGE | INTRAMUSCULAR | Status: DC
  Administered 2018-06-08: 200 ug via SUBCUTANEOUS

## 2018-06-08 MED ORDER — DARBEPOETIN ALFA 200 MCG/0.4ML IJ SOSY
PREFILLED_SYRINGE | INTRAMUSCULAR | Status: AC
  Administered 2018-06-08: 200 ug via SUBCUTANEOUS
  Filled 2018-06-08: qty 0.4

## 2018-06-08 MED ORDER — SODIUM CHLORIDE 0.9 % IV SOLN
510.0000 mg | Freq: Once | INTRAVENOUS | Status: AC
  Administered 2018-06-08: 510 mg via INTRAVENOUS
  Filled 2018-06-08: qty 17

## 2018-06-19 ENCOUNTER — Ambulatory Visit (HOSPITAL_COMMUNITY)
Admission: RE | Admit: 2018-06-19 | Discharge: 2018-06-19 | Disposition: A | Discharge status: Home / Self Care | Payer: Medicare Other | Source: Ambulatory Visit | Attending: Internal Medicine | Admitting: Internal Medicine

## 2018-06-19 ENCOUNTER — Encounter (HOSPITAL_COMMUNITY): Payer: Self-pay | Admitting: Internal Medicine

## 2018-06-19 VITALS — BP 160/80 | HR 87 | Wt 135.2 lb

## 2018-06-19 DIAGNOSIS — N184 Chronic kidney disease, stage 4 (severe): Secondary | ICD-10-CM

## 2018-06-19 DIAGNOSIS — E785 Hyperlipidemia, unspecified: Secondary | ICD-10-CM | POA: Insufficient documentation

## 2018-06-19 DIAGNOSIS — Z7982 Long term (current) use of aspirin: Secondary | ICD-10-CM | POA: Diagnosis not present

## 2018-06-19 DIAGNOSIS — J45909 Unspecified asthma, uncomplicated: Secondary | ICD-10-CM | POA: Diagnosis not present

## 2018-06-19 DIAGNOSIS — Z794 Long term (current) use of insulin: Secondary | ICD-10-CM | POA: Diagnosis not present

## 2018-06-19 DIAGNOSIS — Z91048 Other nonmedicinal substance allergy status: Secondary | ICD-10-CM | POA: Insufficient documentation

## 2018-06-19 DIAGNOSIS — Z886 Allergy status to analgesic agent status: Secondary | ICD-10-CM | POA: Insufficient documentation

## 2018-06-19 DIAGNOSIS — N183 Chronic kidney disease, stage 3 (moderate): Secondary | ICD-10-CM | POA: Insufficient documentation

## 2018-06-19 DIAGNOSIS — M1990 Unspecified osteoarthritis, unspecified site: Secondary | ICD-10-CM | POA: Diagnosis not present

## 2018-06-19 DIAGNOSIS — I5022 Chronic systolic (congestive) heart failure: Secondary | ICD-10-CM

## 2018-06-19 DIAGNOSIS — N179 Acute kidney failure, unspecified: Secondary | ICD-10-CM | POA: Diagnosis not present

## 2018-06-19 DIAGNOSIS — Z881 Allergy status to other antibiotic agents status: Secondary | ICD-10-CM | POA: Diagnosis not present

## 2018-06-19 DIAGNOSIS — I130 Hypertensive heart and chronic kidney disease with heart failure and stage 1 through stage 4 chronic kidney disease, or unspecified chronic kidney disease: Secondary | ICD-10-CM | POA: Diagnosis not present

## 2018-06-19 DIAGNOSIS — Z79899 Other long term (current) drug therapy: Secondary | ICD-10-CM | POA: Diagnosis not present

## 2018-06-19 DIAGNOSIS — Z8249 Family history of ischemic heart disease and other diseases of the circulatory system: Secondary | ICD-10-CM | POA: Diagnosis not present

## 2018-06-19 DIAGNOSIS — I10 Essential (primary) hypertension: Secondary | ICD-10-CM | POA: Diagnosis not present

## 2018-06-19 DIAGNOSIS — Z888 Allergy status to other drugs, medicaments and biological substances status: Secondary | ICD-10-CM | POA: Insufficient documentation

## 2018-06-19 DIAGNOSIS — M1710 Unilateral primary osteoarthritis, unspecified knee: Secondary | ICD-10-CM | POA: Insufficient documentation

## 2018-06-19 DIAGNOSIS — D649 Anemia, unspecified: Secondary | ICD-10-CM | POA: Insufficient documentation

## 2018-06-19 DIAGNOSIS — E1122 Type 2 diabetes mellitus with diabetic chronic kidney disease: Secondary | ICD-10-CM | POA: Insufficient documentation

## 2018-06-19 LAB — BASIC METABOLIC PANEL
Anion gap: 13 (ref 5–15)
BUN: 101 mg/dL — ABNORMAL HIGH (ref 8–23)
CO2: 30 mmol/L (ref 22–32)
Calcium: 10 mg/dL (ref 8.9–10.3)
Chloride: 91 mmol/L — ABNORMAL LOW (ref 98–111)
Creatinine, Ser: 4.08 mg/dL — ABNORMAL HIGH (ref 0.44–1.00)
GFR calc Af Amer: 12 mL/min — ABNORMAL LOW (ref >60)
GFR calc non Af Amer: 11 mL/min — ABNORMAL LOW (ref >60)
Glucose, Bld: 126 mg/dL — ABNORMAL HIGH (ref 70–99)
Potassium: 3.7 mmol/L (ref 3.5–5.1)
Sodium: 134 mmol/L — ABNORMAL LOW (ref 135–145)

## 2018-06-19 LAB — BRAIN NATRIURETIC PEPTIDE: B Natriuretic Peptide: 791.9 pg/mL — ABNORMAL HIGH (ref 0.0–100.0)

## 2018-06-19 MED ORDER — TORSEMIDE 20 MG PO TABS: 20.0000 mg | ORAL_TABLET | Freq: Two times a day (BID) | ORAL | 6 refills | Status: DC

## 2018-06-19 NOTE — H&P (View-Only) (Signed)
Advanced Heart Failure Clinic Note   PCP: Reynold Bowen, MD PCP-Cardiologist: Pixie Casino, MD  Nephrologist: Dr Hollie Salk  HPI: Belinda [name] is a 67 y.o. 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 AHF team consulted. Diuresed 40 lbs with lasix drip and metolazone. Myeloma panel and PYP scan negative. Myoview as below. No LHC with elevated creatinine. She had AKI with creatinine peak 3.1. She was also treated for a UTI. HF meds adjusted as able, limited by CKD. DC weight: 161 lbs  She presents today for routine  follow up. Overall feels ok but still with significant LE edema. Working with PT and now using a walker to get around the house. Feels she is getting stronger but still limited. No CP. + orthopnea. Weight down 5 pounds over last 2 weeks. (has lost about 65 pounds since initial visit when she was 201 pounds)- Taking torsemide 60 mg/20mg . Unable to take more due to peeing Taking metolazone 5mg  about 3x/weekSaw Dr. Hollie Salk in Nephrology about 2 weeks and creatinine was up to 3.2. SBP at home ~130s.   ABD [place] 04/21/18 with ? Elevated R heart pressures with distended IVC and hepatic veins. Small amount of perihepatic ascites and R medical renal disease.   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  Echo (02/07/18) EF 45-50% with mild RV HK. Moderate TR. Small to moderate pericardial effusion echo texture suggestive of possible amyloid.   Myeloma Panel negative.   Myoview scan reviewed by Dr Haroldine Laws and notable for mild to moderate anterior ischemia - cannot exclude breast attenuation. Given lack of CP and creatinine ~3 will treat medically.  Review of systems complete and found to be negative unless listed in HPI.    Past  Medical History:  Diagnosis Date  . Acute CHF (congestive heart failure) ([place]) 02/02/2018  . Anemia    "get monthly injections to boost my HgB" (02/02/2018)  . Anxiety   . Asthma   . Back pain    intractable low back  . Chronic neck pain   . CKD (chronic kidney disease), stage III ([place])   . Dysrhythmia    palpitations or heart racing periodic. has had for years- Dr Mare Ferrari follows.  [name] GERD (gastroesophageal reflux disease)   . History of blood transfusion 2016   After knee replacemnet  . Hx MRSA infection   . Hyperlipidemia   . Hypertension   . Migraines    "~ 2/month" (02/02/2018)  . Osteoarthritis    "knees, neck" (02/02/2018)  . Palpitations   . Pneumonia    "twice in 2018" (02/02/2018)  . Seasonal allergies   . Type II diabetes mellitus (HCC)    insulin dependent - fasting 140-200     Current Outpatient Medications  Medication Sig Dispense Refill  . acetaminophen (TYLENOL) 650 MG CR tablet Take 1,300 mg by mouth every 8 (eight) hours as needed for pain (pain).    . Ascorbic Acid (VITAMIN C) 1000 MG tablet Take 1,000 mg by mouth at bedtime.    [name] aspirin 81 MG tablet Take 81 mg by mouth daily.    . Azelastine HCl 0.15 % SOLN Place 1 spray into both nostrils 2 (two) times daily.    . budesonide-formoterol (SYMBICORT)  160-4.5 MCG/ACT inhaler Inhale 2 puffs into the lungs 2 (two) times daily.    . carvedilol (COREG) 25 MG tablet Take 12.5 mg by mouth 2 (two) times daily with a meal.    . Cholecalciferol (VITAMIN D-3) 5000 UNITS TABS Take 5,000 Units by mouth daily.    [name] ezetimibe-simvastatin (VYTORIN) 10-40 MG per tablet Take 1 tablet by mouth daily.    . ferrous gluconate (FERGON) 240 (27 FE) MG tablet Take 240 mg by mouth daily.     . fexofenadine (ALLEGRA) 180 MG tablet Take 180 mg by mouth daily.    [name] guaiFENesin (MUCINEX) 600 MG 12 hr tablet Take 600 mg by mouth daily.    [name] HUMULIN R U-500 KWIKPEN 500 UNIT/ML kwikpen Inject 20 Units into the skin 3 (three) times daily with  meals. PER SLIDING SCALE  6  . hydrALAZINE (APRESOLINE) 50 MG tablet Take 1 tablet (50 mg total) by mouth 3 (three) times daily. 90 tablet 3  . isosorbide mononitrate (IMDUR) 30 MG 24 hr tablet Take 0.5 tablets (15 mg total) by mouth daily. 30 tablet 0  . LANTUS SOLOSTAR 100 UNIT/ML Solostar Pen INJECT 12 UNITS AT NIGHT  3  . metolazone (ZAROXOLYN) 5 MG tablet Take 1 tablet (5 mg total) by mouth every other day. And as needed for weight gain/swelling 15 tablet 5  . Multiple Vitamins-Minerals (ALIVE WOMENS ENERGY PO) Take 1 tablet by mouth daily.    . potassium chloride (K-DUR) 10 MEQ tablet Take 30 mEq by mouth daily.     [name] torsemide (DEMADEX) 20 MG tablet Take 20 mg by mouth 2 (two) times daily. 60 mg in the morning 40mg  in the evening    . vitamin E 400 UNIT capsule Take 400 Units by mouth daily.    . nitroGLYCERIN (NITROSTAT) 0.4 MG SL tablet Place 0.4 mg under the tongue every 5 (five) minutes as needed for chest pain.   0   No current facility-administered medications for this encounter.     Allergies  Allergen Reactions  . Omnicef [Cefdinir] Other (See Comments)    GI Upset  . Aspirin Other (See Comments)    Can tolerate low aspirin  . Augmentin [Amoxicillin-Pot Clavulanate] Nausea And Vomiting and Other (See Comments)    GI issues  . Erythromycin Nausea And Vomiting and Other (See Comments)    Gi issues  . Gabapentin Swelling  . Nickel Itching and Rash    Breakouts       Social History   Socioeconomic History  . Marital status: Married    Spouse name: Not on file  . Number of children: 1  . Years of education: Not on file  . Highest education level: Not on file  Occupational History  . Not on file  Social Needs  . Financial resource strain: Not on file  . Food insecurity:    Worry: Not on file    Inability: Not on file  . Transportation needs:    Medical: Not on file    Non-medical: Not on file  Tobacco Use  . Smoking status: Never Smoker  . Smokeless tobacco:  Never Used  Substance and Sexual Activity  . Alcohol use: Never    Frequency: Never  . Drug use: Never  . Sexual activity: Not Currently  Lifestyle  . Physical activity:    Days per week: Not on file    Minutes per session: Not on file  . Stress: Not on file  Relationships  . Social  connections:    Talks on phone: Not on file    Gets together: Not on file    Attends religious service: Not on file    Active member of club or organization: Not on file    Attends meetings of clubs or organizations: Not on file    Relationship status: Not on file  . Intimate partner violence:    Fear of current or ex partner: Not on file    Emotionally abused: Not on file    Physically abused: Not on file    Forced sexual activity: Not on file  Other Topics Concern  . Not on file  Social History Narrative  . Not on file      Family History  Problem Relation Age of Onset  . Heart disease Mother   . Heart failure Father   . Colon cancer Neg Hx   . Stomach cancer Neg Hx   . Esophageal cancer Neg Hx    Vitals:   06/19/18 1101  BP: (!) 160/80  Pulse: 87  SpO2: 96%  Weight: 61.3 kg (135 lb 3.2 oz)   Wt Readings from Last 3 Encounters:  06/19/18 61.3 kg (135 lb 3.2 oz)  06/08/18 63.5 kg (140 lb)  05/10/18 68.4 kg (150 lb 12.8 oz)    PHYSICAL EXAM: General:  Elderly. Sitting in chair No resp difficulty HEENT: normal Neck: supple. JVP 9-10. Carotids 2+ bilat; no bruits. No lymphadenopathy or thryomegaly appreciated. Cor: PMI nondisplaced. Regular rate & rhythm. No rubs, gallops or murmurs. Lungs: clear Abdomen: soft, nontender, nondistended. No hepatosplenomegaly. No bruits or masses. Good bowel sounds. Extremities: no cyanosis, clubbing, rash, 2-3+ edema calves and feet (no longer in thighs)  Neuro: alert & orientedx3, cranial nerves grossly intact. moves all 4 extremities w/o difficulty. Affect pleasant  ASSESSMENT & PLAN:  1. Chronic systolic HF with R>L symptoms - Etiology  unclear. Echo suggestive of cardiac amyloid but w/u negative. Ischemia and hypertensive CM also possible. Unable to cath with A/CKD.   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 Myeloma panel negative.  - EF 2016 was 70% - ECHO 11/18 EF 45-50% Grade IDD RV mildly dilated. Peak PA pressure 43 mm hg.  - ECHO 02/07/18 EF 25-30% - Myoview scan reviewed and notable for mild to moderate anterior ischemia - cannot exclude breast attenuation. Given lack of CP and creatinine ~3 will treat medically. - NYHA III symptoms chronically. - Weight down 5 pounds over past few weeks but still somewhat volume overloaded. Has lost 65 pounds since initial presentation  - Taking torsemide 60 mg/20mg . Unable to take more due to peeing Taking metolazone 5mg  about 3x/week - This may be as good as we can get her volume status without hurting her kidneys further. Ideally would like to use torsemide more and metolazone less. Will plan RHC to further evaluate volume status and right vs left HF.  - Continue coreg 6.25 mg BID - Continue hydralazine to 50 mg TID - Continue imdur 30 mg daily - No ACE/ARB/ARNI/spiro with AKI. BMET today - Will need to consider ICD but will wait to see if renal function stabilizes prior to proceeding. If needs HD likely best to proceed with SQ device   - Reinforced fluid restriction to < 2 L daily, sodium restriction to less than 2000 mg daily, and the importance of daily weights.   - Repeat echo next visit 2. CKD with recent AKI - Baseline creatinine ~2.5. Followed by Dr Hollie Salk.  Peaked at 3.1 during admission.  - BMET today 3. Probable OSA - Refer for sleep study 4. HTN - Meds as above.  - BP a little up today but has been good at home. No change 5.DM2 - Per PCP  6. Severe Deconditioning - Continue HH PT    Glori Bickers, MD 06/19/18

## 2018-06-19 NOTE — Addendum Note (Signed)
Encounter addended by: Marlise Eves, RN on: 06/19/2018 1:14 PM  Actions taken: Order list changed, Diagnosis association updated

## 2018-06-19 NOTE — Addendum Note (Signed)
Encounter addended by: Marlise Eves, RN on: 06/19/2018 12:12 PM  Actions taken: Diagnosis association updated, Order list changed, Charge Capture section accepted, Clinical Note Signed

## 2018-06-19 NOTE — Patient Instructions (Signed)
Labs done today  [facility] [place] [id number] [place] [address] Dept: [phone] Loc: [phone]  Belinda [name]  06/19/2018  You are scheduled for a Cardiac Catheterization on Friday, December 27 with Dr. Glori Bickers.  1. Please arrive at the [facility] (Main Entrance A) at [facility]: [address] [place], [place] [address] at 11:30 AM (This time is two hours before your procedure to ensure your preparation). Free valet parking service is available.   Special note: Every effort is made to have your procedure done on time. Please understand that emergencies sometimes delay scheduled procedures.  2. Diet: Do not eat solid foods after midnight.  The patient may have clear liquids until 5am upon the day of the procedure.    4. Medication instructions in preparation for your procedure: Hold your metolazone and torsemide prior to your procedure. You may resume your medications after your procedure   On the morning of your procedure, take your Aspirin and any morning medicines NOT listed above.  You may use sips of water.  5. Bring a current list of your medications and current insurance cards. 6. You MUST have a responsible person to drive you home. 7. Someone MUST be with you the first 24 hours after you arrive home or your discharge will be delayed. 8. Please wear clothes that are easy to get on and off and wear slip-on shoes.  Thank you for allowing [place] to care for you!   -- [facility]

## 2018-06-19 NOTE — Progress Notes (Signed)
Advanced Heart Failure Clinic Note   PCP: Reynold Bowen, MD PCP-Cardiologist: Pixie Casino, MD  Nephrologist: Dr Hollie Salk  HPI: Belinda [name] is a 67 y.o. 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 AHF team consulted. Diuresed 40 lbs with lasix drip and metolazone. Myeloma panel and PYP scan negative. Myoview as below. No LHC with elevated creatinine. She had AKI with creatinine peak 3.1. She was also treated for a UTI. HF meds adjusted as able, limited by CKD. DC weight: 161 lbs  She presents today for routine  follow up. Overall feels ok but still with significant LE edema. Working with PT and now using a walker to get around the house. Feels she is getting stronger but still limited. No CP. + orthopnea. Weight down 5 pounds over last 2 weeks. (has lost about 65 pounds since initial visit when she was 201 pounds)- Taking torsemide 60 mg/20mg . Unable to take more due to peeing Taking metolazone 5mg  about 3x/weekSaw Dr. Hollie Salk in Nephrology about 2 weeks and creatinine was up to 3.2. SBP at home ~130s.   ABD [place] 04/21/18 with ? Elevated R heart pressures with distended IVC and hepatic veins. Small amount of perihepatic ascites and R medical renal disease.   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  Echo (02/07/18) EF 45-50% with mild RV HK. Moderate TR. Small to moderate pericardial effusion echo texture suggestive of possible amyloid.   Myeloma Panel negative.   Myoview scan reviewed by Dr Haroldine Laws and notable for mild to moderate anterior ischemia - cannot exclude breast attenuation. Given lack of CP and creatinine ~3 will treat medically.  Review of systems complete and found to be negative unless listed in HPI.    Past  Medical History:  Diagnosis Date  . Acute CHF (congestive heart failure) ([place]) 02/02/2018  . Anemia    "get monthly injections to boost my HgB" (02/02/2018)  . Anxiety   . Asthma   . Back pain    intractable low back  . Chronic neck pain   . CKD (chronic kidney disease), stage III ([place])   . Dysrhythmia    palpitations or heart racing periodic. has had for years- Dr Mare Ferrari follows.  [name] GERD (gastroesophageal reflux disease)   . History of blood transfusion 2016   After knee replacemnet  . Hx MRSA infection   . Hyperlipidemia   . Hypertension   . Migraines    "~ 2/month" (02/02/2018)  . Osteoarthritis    "knees, neck" (02/02/2018)  . Palpitations   . Pneumonia    "twice in 2018" (02/02/2018)  . Seasonal allergies   . Type II diabetes mellitus (HCC)    insulin dependent - fasting 140-200     Current Outpatient Medications  Medication Sig Dispense Refill  . acetaminophen (TYLENOL) 650 MG CR tablet Take 1,300 mg by mouth every 8 (eight) hours as needed for pain (pain).    . Ascorbic Acid (VITAMIN C) 1000 MG tablet Take 1,000 mg by mouth at bedtime.    [name] aspirin 81 MG tablet Take 81 mg by mouth daily.    . Azelastine HCl 0.15 % SOLN Place 1 spray into both nostrils 2 (two) times daily.    . budesonide-formoterol (SYMBICORT)  160-4.5 MCG/ACT inhaler Inhale 2 puffs into the lungs 2 (two) times daily.    . carvedilol (COREG) 25 MG tablet Take 12.5 mg by mouth 2 (two) times daily with a meal.    . Cholecalciferol (VITAMIN D-3) 5000 UNITS TABS Take 5,000 Units by mouth daily.    [name] ezetimibe-simvastatin (VYTORIN) 10-40 MG per tablet Take 1 tablet by mouth daily.    . ferrous gluconate (FERGON) 240 (27 FE) MG tablet Take 240 mg by mouth daily.     . fexofenadine (ALLEGRA) 180 MG tablet Take 180 mg by mouth daily.    [name] guaiFENesin (MUCINEX) 600 MG 12 hr tablet Take 600 mg by mouth daily.    [name] HUMULIN R U-500 KWIKPEN 500 UNIT/ML kwikpen Inject 20 Units into the skin 3 (three) times daily with  meals. PER SLIDING SCALE  6  . hydrALAZINE (APRESOLINE) 50 MG tablet Take 1 tablet (50 mg total) by mouth 3 (three) times daily. 90 tablet 3  . isosorbide mononitrate (IMDUR) 30 MG 24 hr tablet Take 0.5 tablets (15 mg total) by mouth daily. 30 tablet 0  . LANTUS SOLOSTAR 100 UNIT/ML Solostar Pen INJECT 12 UNITS AT NIGHT  3  . metolazone (ZAROXOLYN) 5 MG tablet Take 1 tablet (5 mg total) by mouth every other day. And as needed for weight gain/swelling 15 tablet 5  . Multiple Vitamins-Minerals (ALIVE WOMENS ENERGY PO) Take 1 tablet by mouth daily.    . potassium chloride (K-DUR) 10 MEQ tablet Take 30 mEq by mouth daily.     [name] torsemide (DEMADEX) 20 MG tablet Take 20 mg by mouth 2 (two) times daily. 60 mg in the morning 40mg  in the evening    . vitamin E 400 UNIT capsule Take 400 Units by mouth daily.    . nitroGLYCERIN (NITROSTAT) 0.4 MG SL tablet Place 0.4 mg under the tongue every 5 (five) minutes as needed for chest pain.   0   No current facility-administered medications for this encounter.     Allergies  Allergen Reactions  . Omnicef [Cefdinir] Other (See Comments)    GI Upset  . Aspirin Other (See Comments)    Can tolerate low aspirin  . Augmentin [Amoxicillin-Pot Clavulanate] Nausea And Vomiting and Other (See Comments)    GI issues  . Erythromycin Nausea And Vomiting and Other (See Comments)    Gi issues  . Gabapentin Swelling  . Nickel Itching and Rash    Breakouts       Social History   Socioeconomic History  . Marital status: Married    Spouse name: Not on file  . Number of children: 1  . Years of education: Not on file  . Highest education level: Not on file  Occupational History  . Not on file  Social Needs  . Financial resource strain: Not on file  . Food insecurity:    Worry: Not on file    Inability: Not on file  . Transportation needs:    Medical: Not on file    Non-medical: Not on file  Tobacco Use  . Smoking status: Never Smoker  . Smokeless tobacco:  Never Used  Substance and Sexual Activity  . Alcohol use: Never    Frequency: Never  . Drug use: Never  . Sexual activity: Not Currently  Lifestyle  . Physical activity:    Days per week: Not on file    Minutes per session: Not on file  . Stress: Not on file  Relationships  . Social  connections:    Talks on phone: Not on file    Gets together: Not on file    Attends religious service: Not on file    Active member of club or organization: Not on file    Attends meetings of clubs or organizations: Not on file    Relationship status: Not on file  . Intimate partner violence:    Fear of current or ex partner: Not on file    Emotionally abused: Not on file    Physically abused: Not on file    Forced sexual activity: Not on file  Other Topics Concern  . Not on file  Social History Narrative  . Not on file      Family History  Problem Relation Age of Onset  . Heart disease Mother   . Heart failure Father   . Colon cancer Neg Hx   . Stomach cancer Neg Hx   . Esophageal cancer Neg Hx    Vitals:   06/19/18 1101  BP: (!) 160/80  Pulse: 87  SpO2: 96%  Weight: 61.3 kg (135 lb 3.2 oz)   Wt Readings from Last 3 Encounters:  06/19/18 61.3 kg (135 lb 3.2 oz)  06/08/18 63.5 kg (140 lb)  05/10/18 68.4 kg (150 lb 12.8 oz)    PHYSICAL EXAM: General:  Elderly. Sitting in chair No resp difficulty HEENT: normal Neck: supple. JVP 9-10. Carotids 2+ bilat; no bruits. No lymphadenopathy or thryomegaly appreciated. Cor: PMI nondisplaced. Regular rate & rhythm. No rubs, gallops or murmurs. Lungs: clear Abdomen: soft, nontender, nondistended. No hepatosplenomegaly. No bruits or masses. Good bowel sounds. Extremities: no cyanosis, clubbing, rash, 2-3+ edema calves and feet (no longer in thighs)  Neuro: alert & orientedx3, cranial nerves grossly intact. moves all 4 extremities w/o difficulty. Affect pleasant  ASSESSMENT & PLAN:  1. Chronic systolic HF with R>L symptoms - Etiology  unclear. Echo suggestive of cardiac amyloid but w/u negative. Ischemia and hypertensive CM also possible. Unable to cath with A/CKD.   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 Myeloma panel negative.  - EF 2016 was 70% - ECHO 11/18 EF 45-50% Grade IDD RV mildly dilated. Peak PA pressure 43 mm hg.  - ECHO 02/07/18 EF 25-30% - Myoview scan reviewed and notable for mild to moderate anterior ischemia - cannot exclude breast attenuation. Given lack of CP and creatinine ~3 will treat medically. - NYHA III symptoms chronically. - Weight down 5 pounds over past few weeks but still somewhat volume overloaded. Has lost 65 pounds since initial presentation  - Taking torsemide 60 mg/20mg . Unable to take more due to peeing Taking metolazone 5mg  about 3x/week - This may be as good as we can get her volume status without hurting her kidneys further. Ideally would like to use torsemide more and metolazone less. Will plan RHC to further evaluate volume status and right vs left HF.  - Continue coreg 6.25 mg BID - Continue hydralazine to 50 mg TID - Continue imdur 30 mg daily - No ACE/ARB/ARNI/spiro with AKI. BMET today - Will need to consider ICD but will wait to see if renal function stabilizes prior to proceeding. If needs HD likely best to proceed with SQ device   - Reinforced fluid restriction to < 2 L daily, sodium restriction to less than 2000 mg daily, and the importance of daily weights.   - Repeat echo next visit 2. CKD with recent AKI - Baseline creatinine ~2.5. Followed by Dr Hollie Salk.  Peaked at 3.1 during admission.  - BMET today 3. Probable OSA - Refer for sleep study 4. HTN - Meds as above.  - BP a little up today but has been good at home. No change 5.DM2 - Per PCP  6. Severe Deconditioning - Continue HH PT    Glori Bickers, MD 06/19/18

## 2018-06-19 NOTE — Addendum Note (Signed)
Encounter addended by: Marlise Eves, RN on: 06/19/2018 4:19 PM  Actions taken: Diagnosis association updated, Order list changed

## 2018-06-22 ENCOUNTER — Encounter (HOSPITAL_COMMUNITY): Payer: Medicare Other

## 2018-06-23 ENCOUNTER — Ambulatory Visit (HOSPITAL_COMMUNITY)
Admission: RE | Admit: 2018-06-23 | Discharge: 2018-06-23 | Disposition: A | Discharge status: Home / Self Care | Payer: Medicare Other | Attending: Internal Medicine | Admitting: Internal Medicine

## 2018-06-23 ENCOUNTER — Encounter (HOSPITAL_COMMUNITY)
Admission: RE | Disposition: A | Discharge status: Home / Self Care | Payer: Self-pay | Source: Home / Self Care | Attending: Internal Medicine

## 2018-06-23 ENCOUNTER — Other Ambulatory Visit: Payer: Self-pay

## 2018-06-23 DIAGNOSIS — J45909 Unspecified asthma, uncomplicated: Secondary | ICD-10-CM | POA: Diagnosis not present

## 2018-06-23 DIAGNOSIS — K219 Gastro-esophageal reflux disease without esophagitis: Secondary | ICD-10-CM | POA: Diagnosis not present

## 2018-06-23 DIAGNOSIS — Z7982 Long term (current) use of aspirin: Secondary | ICD-10-CM | POA: Diagnosis not present

## 2018-06-23 DIAGNOSIS — Z79899 Other long term (current) drug therapy: Secondary | ICD-10-CM | POA: Diagnosis not present

## 2018-06-23 DIAGNOSIS — I130 Hypertensive heart and chronic kidney disease with heart failure and stage 1 through stage 4 chronic kidney disease, or unspecified chronic kidney disease: Secondary | ICD-10-CM | POA: Diagnosis not present

## 2018-06-23 DIAGNOSIS — M1990 Unspecified osteoarthritis, unspecified site: Secondary | ICD-10-CM | POA: Insufficient documentation

## 2018-06-23 DIAGNOSIS — Z7951 Long term (current) use of inhaled steroids: Secondary | ICD-10-CM | POA: Insufficient documentation

## 2018-06-23 DIAGNOSIS — E785 Hyperlipidemia, unspecified: Secondary | ICD-10-CM | POA: Diagnosis not present

## 2018-06-23 DIAGNOSIS — E669 Obesity, unspecified: Secondary | ICD-10-CM | POA: Insufficient documentation

## 2018-06-23 DIAGNOSIS — N179 Acute kidney failure, unspecified: Secondary | ICD-10-CM | POA: Insufficient documentation

## 2018-06-23 DIAGNOSIS — Z794 Long term (current) use of insulin: Secondary | ICD-10-CM | POA: Diagnosis not present

## 2018-06-23 DIAGNOSIS — Z8249 Family history of ischemic heart disease and other diseases of the circulatory system: Secondary | ICD-10-CM | POA: Insufficient documentation

## 2018-06-23 DIAGNOSIS — Z8614 Personal history of Methicillin resistant Staphylococcus aureus infection: Secondary | ICD-10-CM | POA: Insufficient documentation

## 2018-06-23 DIAGNOSIS — Z881 Allergy status to other antibiotic agents status: Secondary | ICD-10-CM | POA: Diagnosis not present

## 2018-06-23 DIAGNOSIS — E1122 Type 2 diabetes mellitus with diabetic chronic kidney disease: Secondary | ICD-10-CM | POA: Insufficient documentation

## 2018-06-23 DIAGNOSIS — Z880 Allergy status to penicillin: Secondary | ICD-10-CM | POA: Diagnosis not present

## 2018-06-23 DIAGNOSIS — Z888 Allergy status to other drugs, medicaments and biological substances status: Secondary | ICD-10-CM | POA: Diagnosis not present

## 2018-06-23 DIAGNOSIS — N183 Chronic kidney disease, stage 3 (moderate): Secondary | ICD-10-CM | POA: Diagnosis not present

## 2018-06-23 DIAGNOSIS — I5022 Chronic systolic (congestive) heart failure: Secondary | ICD-10-CM | POA: Diagnosis not present

## 2018-06-23 DIAGNOSIS — Z886 Allergy status to analgesic agent status: Secondary | ICD-10-CM | POA: Diagnosis not present

## 2018-06-23 HISTORY — PX: RIGHT HEART CATH: CATH118263

## 2018-06-23 LAB — POCT I-STAT 3, VENOUS BLOOD GAS (G3P V)
Acid-Base Excess: 4 mmol/L — ABNORMAL HIGH (ref 0.0–2.0)
Bicarbonate: 24.1 mmol/L (ref 20.0–28.0)
Bicarbonate: 28.1 mmol/L — ABNORMAL HIGH (ref 20.0–28.0)
O2 Saturation: 69 %
O2 Saturation: 71 %
TCO2: 25 mmol/L (ref 22–32)
TCO2: 29 mmol/L (ref 22–32)
pCO2, Ven: 36.2 mmHg — ABNORMAL LOW (ref 44.0–60.0)
pCO2, Ven: 39.5 mmHg — ABNORMAL LOW (ref 44.0–60.0)
pH, Ven: 7.431 — ABNORMAL HIGH (ref 7.250–7.430)
pH, Ven: 7.46 — ABNORMAL HIGH (ref 7.250–7.430)
pO2, Ven: 34 mmHg (ref 32.0–45.0)
pO2, Ven: 36 mmHg (ref 32.0–45.0)

## 2018-06-23 LAB — BASIC METABOLIC PANEL
Anion gap: 14 (ref 5–15)
BUN: 111 mg/dL — ABNORMAL HIGH (ref 8–23)
CO2: 28 mmol/L (ref 22–32)
Calcium: 9.5 mg/dL (ref 8.9–10.3)
Chloride: 93 mmol/L — ABNORMAL LOW (ref 98–111)
Creatinine, Ser: 4.68 mg/dL — ABNORMAL HIGH (ref 0.44–1.00)
GFR calc Af Amer: 10 mL/min — ABNORMAL LOW (ref >60)
GFR calc non Af Amer: 9 mL/min — ABNORMAL LOW (ref >60)
Glucose, Bld: 314 mg/dL — ABNORMAL HIGH (ref 70–99)
Potassium: 5.3 mmol/L — ABNORMAL HIGH (ref 3.5–5.1)
Sodium: 135 mmol/L (ref 135–145)

## 2018-06-23 LAB — GLUCOSE, CAPILLARY: Glucose-Capillary: 300 mg/dL — ABNORMAL HIGH (ref 70–99)

## 2018-06-23 LAB — CBC
HCT: 37.1 % (ref 36.0–46.0)
Hemoglobin: 11.9 g/dL — ABNORMAL LOW (ref 12.0–15.0)
MCH: 25.8 pg — ABNORMAL LOW (ref 26.0–34.0)
MCHC: 32.1 g/dL (ref 30.0–36.0)
MCV: 80.3 fL (ref 80.0–100.0)
Platelets: 229 10*3/uL (ref 150–400)
RBC: 4.62 MIL/uL (ref 3.87–5.11)
RDW: 21.6 % — ABNORMAL HIGH (ref 11.5–15.5)
WBC: 6.2 10*3/uL (ref 4.0–10.5)
nRBC: 0 % (ref 0.0–0.2)

## 2018-06-23 SURGERY — RIGHT HEART CATH
Anesthesia: LOCAL

## 2018-06-23 MED ORDER — ACETAMINOPHEN 325 MG PO TABS: 650.0000 mg | ORAL_TABLET | ORAL | Status: DC | PRN

## 2018-06-23 MED ORDER — ASPIRIN 81 MG PO CHEW: 81.0000 mg | CHEWABLE_TABLET | ORAL | Status: DC

## 2018-06-23 MED ORDER — HEPARIN (PORCINE) IN NACL 1000-0.9 UT/500ML-% IV SOLN
INTRAVENOUS | Status: DC | PRN
  Administered 2018-06-23: 500 mL

## 2018-06-23 MED ORDER — ONDANSETRON HCL 4 MG/2ML IJ SOLN: 4.0000 mg | Freq: Four times a day (QID) | INTRAMUSCULAR | Status: DC | PRN

## 2018-06-23 MED ORDER — HEPARIN (PORCINE) IN NACL 1000-0.9 UT/500ML-% IV SOLN
INTRAVENOUS | Status: AC
  Filled 2018-06-23: qty 500

## 2018-06-23 MED ORDER — SODIUM CHLORIDE 0.9 % IV SOLN
INTRAVENOUS | Status: DC
  Administered 2018-06-23: 12:00:00 via INTRAVENOUS

## 2018-06-23 MED ORDER — SODIUM CHLORIDE 0.9% FLUSH: 3.0000 mL | Freq: Two times a day (BID) | INTRAVENOUS | Status: DC

## 2018-06-23 MED ORDER — LIDOCAINE HCL (PF) 1 % IJ SOLN
INTRAMUSCULAR | Status: DC | PRN
  Administered 2018-06-23: 3 mL

## 2018-06-23 MED ORDER — HYDRALAZINE HCL 20 MG/ML IJ SOLN
INTRAMUSCULAR | Status: AC
  Filled 2018-06-23: qty 1

## 2018-06-23 MED ORDER — HYDRALAZINE HCL 20 MG/ML IJ SOLN
INTRAMUSCULAR | Status: DC | PRN
  Administered 2018-06-23: 10 mg via INTRAVENOUS

## 2018-06-23 MED ORDER — SODIUM CHLORIDE 0.9% FLUSH: 3.0000 mL | INTRAVENOUS | Status: DC | PRN

## 2018-06-23 MED ORDER — SODIUM CHLORIDE 0.9 % IV SOLN: 250.0000 mL | INTRAVENOUS | Status: DC | PRN

## 2018-06-23 MED ORDER — LIDOCAINE HCL (PF) 1 % IJ SOLN
INTRAMUSCULAR | Status: AC
  Filled 2018-06-23: qty 30

## 2018-06-23 SURGICAL SUPPLY — 5 items
CATH SWAN GANZ 7F STRAIGHT (CATHETERS) ×2 IMPLANT
GLIDESHEATH SLENDER 7FR .021G (SHEATH) ×2 IMPLANT
GUIDEWIRE .025 260CM (WIRE) ×2 IMPLANT
PACK CARDIAC CATHETERIZATION (CUSTOM PROCEDURE TRAY) ×2 IMPLANT
TRANSDUCER W/STOPCOCK (MISCELLANEOUS) ×2 IMPLANT

## 2018-06-23 NOTE — Discharge Instructions (Signed)
Brachial Site Care  This sheet gives you information about how to care for yourself after your procedure. Your health care provider may also give you more specific instructions. If you have problems or questions, contact your health care provider. What can I expect after the procedure? After the procedure, it is common to have:  Bruising and tenderness at the catheter insertion area. Follow these instructions at home: Medicines  Take over-the-counter and prescription medicines only as told by your health care provider. Insertion site care  Follow instructions from your health care provider about how to take care of your insertion site. Make sure you: ? Wash your hands with soap and water before you change your bandage (dressing). If soap and water are not available, use hand sanitizer. ? Change your dressing as told by your health care provider. ? Leave stitches (sutures), skin glue, or adhesive strips in place. These skin closures may need to stay in place for 2 weeks or longer. If adhesive strip edges start to loosen and curl up, you may trim the loose edges. Do not remove adhesive strips completely unless your health care provider tells you to do that.  Check your insertion site every day for signs of infection. Check for: ? Redness, swelling, or pain. ? Fluid or blood. ? Pus or a bad smell. ? Warmth.  Do not take baths, swim, or use a hot tub until your health care provider approves.  You may shower 24 hours after the procedure, or as directed by your health care provider. ? Remove the dressing and gently wash the site with plain soap and water. ? Pat the area dry with a clean towel. ? Do not rub the site. That could cause bleeding.  Do not apply powder or lotion to the site. Activity   For 24 hours after the procedure, or as directed by your health care provider: ? Do not flex or bend the affected arm. ? Do not push or pull heavy objects with the affected arm. ? Do not  drive yourself home from the hospital or clinic. You may drive 24 hours after the procedure unless your health care provider tells you not to. ? Do not operate machinery or power tools.  Do not lift anything that is heavier than 10 lb (4.5 kg), or the limit that you are told, until your health care provider says that it is safe.  Ask your health care provider when it is okay to: ? Return to work or school. ? Resume usual physical activities or sports. ? Resume sexual activity. General instructions  If the catheter site starts to bleed, raise your arm and put firm pressure on the site. If the bleeding does not stop, get help right away. This is a medical emergency.  If you went home on the same day as your procedure, a responsible adult should be with you for the first 24 hours after you arrive home.  Keep all follow-up visits as told by your health care provider. This is important. Contact a health care provider if:  You have a fever.  You have redness, swelling, or yellow drainage around your insertion site. Get help right away if:  The insertion area is bleeding, and the bleeding does not stop when you hold steady pressure on the area. These symptoms may represent a serious problem that is an emergency. Do not wait to see if the symptoms will go away. Get medical help right away. Call your local emergency services (911 in the  [place] Do not drive yourself to the hospital. Summary  After the procedure, it is common to have bruising and tenderness at the site.  Follow instructions from your health care provider about how to take care of your radial site wound. Check the wound every day for signs of infection.  Do not lift anything that is heavier than 10 lb (4.5 kg), or the limit that you are told, until your health care provider says that it is safe. This information is not intended to replace advice given to you by your health care provider. Make sure you discuss any questions you  have with your health care provider. Document Released: 07/17/2010 Document Revised: 07/20/2017 Document Reviewed: 07/20/2017 Elsevier Interactive Patient Education  2019 [employer].

## 2018-06-23 NOTE — Research (Addendum)
PIVA Informed Consent   Subject Name: Belinda [name]  Subject met inclusion and exclusion criteria.  The informed consent form, study requirements and expectations were reviewed with the subject and questions and concerns were addressed prior to the signing of the consent form.  The subject verbalized understanding of the trail requirements.  The subject agreed to participate in the PIVA trial and signed the informed consent.  The informed consent was obtained prior to performance of any protocol-specific procedures for the subject.  A copy of the signed informed consent was given to the subject and a copy was placed in the subject's medical record.  Pt states she is post menopause.   Yes No Inclusion   '[x]'$  '[]'$  Patient is ?68 years old.  '[x]'$  '[]'$  Patient is undergoing in-patient or out-patient elective right heart catheterization.  '[x]'$  '[]'$  Patient or legally-authorized representative has signed a written informed consent form (ICF) per 21 CFR Part 50.55(e).    Exclusion   '[]'$  '[x]'$  Known restrictive cardiomyopathy (e.g. cardiac amyloidosis) or constrictive cardiac disease (e.g. constrictive pericarditis, cardiac tamponade).  '[]'$  '[x]'$  Congenital heart disease other than patent foramen ovale, repaired atrial or ventricular septal defect.  '[]'$  '[x]'$  The following valvular diseases: a. Aortic valvular disease greater than mild in severity, b. Mitral valvular disease greater than mild in severity c. Aortic or mitral valve replacement  '[]'$  '[x]'$  Active irregular heart rhythms at the time of the study as determined by review of an electrocardiogram completed within the last 30 days (Occasional ectopic beats need not be excluded)  '[]'$  '[x]'$  Active mechanical circulation (intra-aortic balloon pump, ventricular assist devices, extracorporeal membrane oxygenation, hemodialysis) running while the study is being performed.  '[]'$  '[x]'$  Female patients who are pregnant  '[]'$  '[x]'$  Patients who are currently participating in, or  have within the past 30 days participated in, another interventional clinical study or have used another investigational device or drug within the past 30 days.  '[]'$  '[x]'$   Patients with BMI >40 kg/m  '[]'$  '[x]'$   Cardiac transplant within the last 3 months.    [name] 06/23/2018, 1235

## 2018-06-23 NOTE — Progress Notes (Signed)
Pt took asa 81 mg at 0800 and threw up 0900. CBG 300, Eliezer Champagne RN cath lab was updated, no further orders.

## 2018-06-23 NOTE — Interval H&P Note (Signed)
History and Physical Interval Note:  06/23/2018 2:38 PM  [name]  has presented today for surgery, with the diagnosis of chf  The various methods of treatment have been discussed with the patient and family. After consideration of risks, benefits and other options for treatment, the patient has consented to  Procedure(s): RIGHT HEART CATH (N/A) as a surgical intervention .  The patient's history has been reviewed, patient examined, no change in status, stable for surgery.  I have reviewed the patient's chart and labs.  Questions were answered to the patient's satisfaction.     Daniel Bensimhon

## 2018-06-26 ENCOUNTER — Encounter (HOSPITAL_COMMUNITY): Payer: Self-pay | Admitting: Internal Medicine

## 2018-06-29 DIAGNOSIS — Z006 Encounter for examination for normal comparison and control in clinical research program: Secondary | ICD-10-CM

## 2018-06-29 NOTE — Research (Signed)
  Follow up telephone call with patient post Right heart cath and PIVA study. No problems with insertion site at this time and patient doing well.

## 2018-07-06 ENCOUNTER — Ambulatory Visit (HOSPITAL_COMMUNITY)
Admission: RE | Admit: 2018-07-06 | Discharge: 2018-07-06 | Disposition: A | Discharge status: Home / Self Care | Payer: Medicare Other | Source: Ambulatory Visit | Attending: Nephrology | Admitting: Nephrology

## 2018-07-06 VITALS — BP 148/100 | HR 93 | Temp 97.6°F | Resp 20

## 2018-07-06 DIAGNOSIS — D631 Anemia in chronic kidney disease: Secondary | ICD-10-CM | POA: Diagnosis present

## 2018-07-06 DIAGNOSIS — N183 Chronic kidney disease, stage 3 unspecified: Secondary | ICD-10-CM

## 2018-07-06 LAB — RENAL FUNCTION PANEL
Albumin: 3.2 g/dL — ABNORMAL LOW (ref 3.5–5.0)
Anion gap: 11 (ref 5–15)
BUN: 64 mg/dL — ABNORMAL HIGH (ref 8–23)
CO2: 25 mmol/L (ref 22–32)
Calcium: 9.5 mg/dL (ref 8.9–10.3)
Chloride: 99 mmol/L (ref 98–111)
Creatinine, Ser: 3.47 mg/dL — ABNORMAL HIGH (ref 0.44–1.00)
GFR calc Af Amer: 15 mL/min — ABNORMAL LOW (ref >60)
GFR calc non Af Amer: 13 mL/min — ABNORMAL LOW (ref >60)
Glucose, Bld: 69 mg/dL — ABNORMAL LOW (ref 70–99)
Phosphorus: 4.2 mg/dL (ref 2.5–4.6)
Potassium: 3.7 mmol/L (ref 3.5–5.1)
Sodium: 135 mmol/L (ref 135–145)

## 2018-07-06 LAB — IRON AND TIBC
Iron: 76 ug/dL (ref 28–170)
Saturation Ratios: 23 % (ref 10.4–31.8)
TIBC: 326 ug/dL (ref 250–450)
UIBC: 250 ug/dL

## 2018-07-06 LAB — POCT HEMOGLOBIN-HEMACUE: Hemoglobin: 11 g/dL — ABNORMAL LOW (ref 12.0–15.0)

## 2018-07-06 LAB — FERRITIN: Ferritin: 611 ng/mL — ABNORMAL HIGH (ref 11–307)

## 2018-07-06 MED ORDER — DARBEPOETIN ALFA 200 MCG/0.4ML IJ SOSY
PREFILLED_SYRINGE | INTRAMUSCULAR | Status: AC
  Filled 2018-07-06: qty 0.4

## 2018-07-06 MED ORDER — DARBEPOETIN ALFA 200 MCG/0.4ML IJ SOSY
200.0000 ug | PREFILLED_SYRINGE | INTRAMUSCULAR | Status: DC
  Administered 2018-07-06: 200 ug via SUBCUTANEOUS

## 2018-08-03 ENCOUNTER — Encounter (HOSPITAL_COMMUNITY): Payer: Medicare Other

## 2018-08-10 ENCOUNTER — Ambulatory Visit (HOSPITAL_COMMUNITY)
Admission: RE | Admit: 2018-08-10 | Discharge: 2018-08-10 | Disposition: A | Discharge status: Home / Self Care | Payer: Medicare Other | Source: Ambulatory Visit | Attending: Nephrology | Admitting: Nephrology

## 2018-08-10 VITALS — BP 148/83 | HR 83

## 2018-08-10 DIAGNOSIS — N183 Chronic kidney disease, stage 3 unspecified: Secondary | ICD-10-CM

## 2018-08-10 DIAGNOSIS — D631 Anemia in chronic kidney disease: Secondary | ICD-10-CM | POA: Insufficient documentation

## 2018-08-10 LAB — RENAL FUNCTION PANEL
Albumin: 3.4 g/dL — ABNORMAL LOW (ref 3.5–5.0)
Anion gap: 11 (ref 5–15)
BUN: 59 mg/dL — ABNORMAL HIGH (ref 8–23)
CO2: 27 mmol/L (ref 22–32)
Calcium: 9.5 mg/dL (ref 8.9–10.3)
Chloride: 99 mmol/L (ref 98–111)
Creatinine, Ser: 3.22 mg/dL — ABNORMAL HIGH (ref 0.44–1.00)
GFR calc Af Amer: 16 mL/min — ABNORMAL LOW (ref >60)
GFR calc non Af Amer: 14 mL/min — ABNORMAL LOW (ref >60)
Glucose, Bld: 194 mg/dL — ABNORMAL HIGH (ref 70–99)
Phosphorus: 3.8 mg/dL (ref 2.5–4.6)
Potassium: 3.3 mmol/L — ABNORMAL LOW (ref 3.5–5.1)
Sodium: 137 mmol/L (ref 135–145)

## 2018-08-10 LAB — POCT HEMOGLOBIN-HEMACUE: Hemoglobin: 11.2 g/dL — ABNORMAL LOW (ref 12.0–15.0)

## 2018-08-10 LAB — IRON AND TIBC
Iron: 80 ug/dL (ref 28–170)
Saturation Ratios: 27 % (ref 10.4–31.8)
TIBC: 297 ug/dL (ref 250–450)
UIBC: 217 ug/dL

## 2018-08-10 LAB — FERRITIN: Ferritin: 448 ng/mL — ABNORMAL HIGH (ref 11–307)

## 2018-08-10 MED ORDER — DARBEPOETIN ALFA 200 MCG/0.4ML IJ SOSY
PREFILLED_SYRINGE | INTRAMUSCULAR | Status: AC
  Filled 2018-08-10: qty 0.4

## 2018-08-10 MED ORDER — DARBEPOETIN ALFA 200 MCG/0.4ML IJ SOSY
200.0000 ug | PREFILLED_SYRINGE | INTRAMUSCULAR | Status: DC
  Administered 2018-08-10: 200 ug via SUBCUTANEOUS

## 2018-08-23 ENCOUNTER — Ambulatory Visit (HOSPITAL_COMMUNITY)
Admission: RE | Admit: 2018-08-23 | Discharge: 2018-08-23 | Disposition: A | Discharge status: Home / Self Care | Payer: Medicare Other | Source: Ambulatory Visit | Attending: Internal Medicine | Admitting: Internal Medicine

## 2018-08-23 ENCOUNTER — Ambulatory Visit (HOSPITAL_BASED_OUTPATIENT_CLINIC_OR_DEPARTMENT_OTHER)
Admission: RE | Admit: 2018-08-23 | Discharge: 2018-08-23 | Disposition: A | Discharge status: Home / Self Care | Payer: Medicare Other | Source: Ambulatory Visit

## 2018-08-23 ENCOUNTER — Other Ambulatory Visit (HOSPITAL_COMMUNITY): Payer: Medicare Other

## 2018-08-23 VITALS — BP 154/90 | HR 80 | Wt 132.6 lb

## 2018-08-23 DIAGNOSIS — I5022 Chronic systolic (congestive) heart failure: Secondary | ICD-10-CM

## 2018-08-23 DIAGNOSIS — E1122 Type 2 diabetes mellitus with diabetic chronic kidney disease: Secondary | ICD-10-CM | POA: Insufficient documentation

## 2018-08-23 DIAGNOSIS — N184 Chronic kidney disease, stage 4 (severe): Secondary | ICD-10-CM

## 2018-08-23 DIAGNOSIS — R000 Tachycardia, unspecified: Secondary | ICD-10-CM | POA: Diagnosis not present

## 2018-08-23 DIAGNOSIS — M542 Cervicalgia: Secondary | ICD-10-CM | POA: Insufficient documentation

## 2018-08-23 DIAGNOSIS — N183 Chronic kidney disease, stage 3 (moderate): Secondary | ICD-10-CM | POA: Diagnosis not present

## 2018-08-23 DIAGNOSIS — Z8249 Family history of ischemic heart disease and other diseases of the circulatory system: Secondary | ICD-10-CM | POA: Insufficient documentation

## 2018-08-23 DIAGNOSIS — I10 Essential (primary) hypertension: Secondary | ICD-10-CM

## 2018-08-23 DIAGNOSIS — R5381 Other malaise: Secondary | ICD-10-CM

## 2018-08-23 DIAGNOSIS — R188 Other ascites: Secondary | ICD-10-CM | POA: Diagnosis not present

## 2018-08-23 DIAGNOSIS — I509 Heart failure, unspecified: Secondary | ICD-10-CM | POA: Diagnosis not present

## 2018-08-23 DIAGNOSIS — Z79899 Other long term (current) drug therapy: Secondary | ICD-10-CM | POA: Diagnosis not present

## 2018-08-23 DIAGNOSIS — I130 Hypertensive heart and chronic kidney disease with heart failure and stage 1 through stage 4 chronic kidney disease, or unspecified chronic kidney disease: Secondary | ICD-10-CM | POA: Insufficient documentation

## 2018-08-23 DIAGNOSIS — R002 Palpitations: Secondary | ICD-10-CM | POA: Insufficient documentation

## 2018-08-23 DIAGNOSIS — Z8614 Personal history of Methicillin resistant Staphylococcus aureus infection: Secondary | ICD-10-CM | POA: Insufficient documentation

## 2018-08-23 DIAGNOSIS — E785 Hyperlipidemia, unspecified: Secondary | ICD-10-CM | POA: Diagnosis not present

## 2018-08-23 DIAGNOSIS — Z794 Long term (current) use of insulin: Secondary | ICD-10-CM | POA: Diagnosis not present

## 2018-08-23 DIAGNOSIS — Z7982 Long term (current) use of aspirin: Secondary | ICD-10-CM | POA: Diagnosis not present

## 2018-08-23 DIAGNOSIS — J45909 Unspecified asthma, uncomplicated: Secondary | ICD-10-CM | POA: Insufficient documentation

## 2018-08-23 DIAGNOSIS — N179 Acute kidney failure, unspecified: Secondary | ICD-10-CM | POA: Diagnosis not present

## 2018-08-23 DIAGNOSIS — R600 Localized edema: Secondary | ICD-10-CM | POA: Diagnosis not present

## 2018-08-23 DIAGNOSIS — K219 Gastro-esophageal reflux disease without esophagitis: Secondary | ICD-10-CM | POA: Insufficient documentation

## 2018-08-23 NOTE — Progress Notes (Signed)
Advanced Heart Failure Clinic Note   PCP: Reynold Bowen, MD PCP-Cardiologist: Pixie Casino, MD  Nephrologist: Dr Hollie Salk  HPI: Belinda [name] is a 68 y.o. 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 AHF team consulted. Diuresed 40 lbs with lasix drip and metolazone. Myeloma panel and PYP scan negative. Myoview as below. No LHC with elevated creatinine. She had AKI with creatinine peak 3.1. She was also treated for a UTI. HF meds adjusted as able, limited by CKD. DC weight: 161 lbs  She presents today for routine follow up. Underwent RHC in 12/19 which showed mild to moderate PAH with normal PCWP and normal outputs. Down 3 lbs since visit in December, [place] at that time looked OK as above. She feels better overall, and feels like her edema is better. Remains SOB with minimal exertion. She, however, says her breathing feels overall better. She can transfer OK. She remains SOB with ADLs, but no longer "gasping for air". Creatinine stable recently, much improved from December.  BP runs in 130-140s at home with Physical Therapy, very occasionally down to 120.   Echo today shows LVEF 25-30%, RV mild to moderately down  RHC 12/19   RA = 4 RV = 57/8 PA = 57/26 (40) PCW = 15 Fick cardiac output/index = 5.0/3.2 Thermo CO/CI = 6.22/3.9 PVR = 5.0 WU Ao sat = 96% PA sat = 69%, 71%  ABD [place] 04/21/18 with ? Elevated R heart pressures with distended IVC and hepatic veins. Small amount of perihepatic ascites and R medical renal disease.   FH: CAD in mother, CHF in father  SH: No ETOH, tobacco, or drug use  PYP scan 02/13/18 - Grade 1, HCL 1.02. Negative for amyloid.   Echo (02/07/18) EF 45-50% with mild RV HK. Moderate TR. Small to moderate pericardial effusion echo texture suggestive of possible amyloid.   Myeloma Panel negative.     Myoview scan reviewed by Dr Haroldine Laws and notable for mild to moderate anterior ischemia - cannot exclude breast attenuation. Given lack of CP and creatinine ~3 will treat medically.  Review of systems complete and found to be negative unless listed in HPI.    Past Medical History:  Diagnosis Date  . Acute CHF (congestive heart failure) ([place]) 02/02/2018  . Anemia    "get monthly injections to boost my HgB" (02/02/2018)  . Anxiety   . Asthma   . Back pain    intractable low back  . Chronic neck pain   . CKD (chronic kidney disease), stage III ([place])   . Dysrhythmia    palpitations or heart racing periodic. has had for years- Dr Mare Ferrari follows.  [name] GERD (gastroesophageal reflux disease)   . History of blood transfusion 2016   After knee replacemnet  . Hx MRSA infection   . Hyperlipidemia   . Hypertension   . Migraines    "~ 2/month" (02/02/2018)  . Osteoarthritis    "knees, neck" (02/02/2018)  . Palpitations   . Pneumonia    "twice in 2018" (02/02/2018)  . Seasonal allergies   . Type II diabetes mellitus (H[facility])    insulin dependent - fasting 140-200     Current Outpatient Medications  Medication Sig Dispense Refill  . acetaminophen (TYLENOL) 650 MG CR tablet Take 650 mg by mouth every 8 (eight) hours as needed for pain (pain).     [name]  Alpha-D-Galactosidase (BEANO PO) Take 1-2 tablets by mouth daily as needed (gas).    . Ascorbic Acid (VITAMIN C) 1000 MG tablet Take 1,000 mg by mouth at bedtime.    [name] aspirin 81 MG tablet Take 81 mg by mouth daily.    . Azelastine HCl 0.15 % SOLN Place 1 spray into both nostrils 2 (two) times daily as needed (allergies).     . budesonide-formoterol (SYMBICORT) 160-4.5 MCG/ACT inhaler Inhale 2 puffs into the lungs 2 (two) times daily as needed (shortness of breath).     . carvedilol (COREG) 25 MG tablet Take 12.5 mg by mouth 2 (two) times daily with a meal.    . Cholecalciferol (VITAMIN D-3) 5000 UNITS TABS Take 5,000 Units by mouth daily.    .  diclofenac sodium (VOLTAREN) 1 % GEL Apply 1 application topically as needed (pain).    [name] ezetimibe-simvastatin (VYTORIN) 10-40 MG per tablet Take 1 tablet by mouth at bedtime.     . ferrous gluconate (FERGON) 240 (27 FE) MG tablet Take 240 mg by mouth daily.     . fexofenadine (ALLEGRA) 180 MG tablet Take 180 mg by mouth daily.    [name] guaiFENesin (MUCINEX) 600 MG 12 hr tablet Take 600 mg by mouth daily.    . hydrALAZINE (APRESOLINE) 50 MG tablet Take 1 tablet (50 mg total) by mouth 3 (three) times daily. 90 tablet 3  . insulin lispro (HUMALOG) 100 UNIT/ML injection Inject 2-10 Units into the skin 3 (three) times daily before meals.    . isosorbide mononitrate (IMDUR) 30 MG 24 hr tablet Take 0.5 tablets (15 mg total) by mouth daily. 30 tablet 0  . LANTUS SOLOSTAR 100 UNIT/ML Solostar Pen Inject 12 Units into the skin at bedtime.   3  . metolazone (ZAROXOLYN) 5 MG tablet Take 1 tablet (5 mg total) by mouth every other day. And as needed for weight gain/swelling (Patient taking differently: Take 5 mg by mouth daily as needed (swelling). ) 15 tablet 5  . Multiple Vitamins-Minerals (ALIVE WOMENS ENERGY PO) Take 1 tablet by mouth daily.    . nitroGLYCERIN (NITROSTAT) 0.4 MG SL tablet Place 0.4 mg under the tongue every 5 (five) minutes as needed for chest pain.   0  . potassium chloride (K-DUR) 10 MEQ tablet Take 10-20 mEq by mouth See admin instructions. Take 20 meq in the morning an 10 meq at night    . simethicone (GAS-X) 80 MG chewable tablet Chew 80-160 mg by mouth daily as needed for flatulence.    . torsemide (DEMADEX) 20 MG tablet Take 1 tablet (20 mg total) by mouth 2 (two) times daily. 60 mg in the morning 40mg  in the evening (Patient taking differently: Take 20-60 mg by mouth See admin instructions. 60 mg in the morning 20 mg in the evening) 60 tablet 6   No current facility-administered medications for this encounter.     Allergies  Allergen Reactions  . Omnicef [Cefdinir] Other (See  Comments)    GI Upset  . Aspirin Other (See Comments)    GI issues, Can tolerate low aspirin  . Augmentin [Amoxicillin-Pot Clavulanate] Nausea And Vomiting and Other (See Comments)    GI issues DID THE REACTION INVOLVE: Swelling of the face/tongue/throat, SOB, or low BP? No Sudden or severe rash/hives, skin peeling, or the inside of the mouth or nose? No Did it require medical treatment? No When did it last happen?long time  If all above answers are "NO", may proceed with cephalosporin use.   [name]  Erythromycin Nausea And Vomiting and Other (See Comments)    Gi issues  . Gabapentin Swelling  . Nickel Itching and Rash    Breakouts       Social History   Socioeconomic History  . Marital status: Married    Spouse name: Not on file  . Number of children: 1  . Years of education: Not on file  . Highest education level: Not on file  Occupational History  . Not on file  Social Needs  . Financial resource strain: Not on file  . Food insecurity:    Worry: Not on file    Inability: Not on file  . Transportation needs:    Medical: Not on file    Non-medical: Not on file  Tobacco Use  . Smoking status: Never Smoker  . Smokeless tobacco: Never Used  Substance and Sexual Activity  . Alcohol use: Never    Frequency: Never  . Drug use: Never  . Sexual activity: Not Currently  Lifestyle  . Physical activity:    Days per week: Not on file    Minutes per session: Not on file  . Stress: Not on file  Relationships  . Social connections:    Talks on phone: Not on file    Gets together: Not on file    Attends religious service: Not on file    Active member of club or organization: Not on file    Attends meetings of clubs or organizations: Not on file    Relationship status: Not on file  . Intimate partner violence:    Fear of current or ex partner: Not on file    Emotionally abused: Not on file    Physically abused: Not on file    Forced sexual activity: Not on file  Other  Topics Concern  . Not on file  Social History Narrative  . Not on file      Family History  Problem Relation Age of Onset  . Heart disease Mother   . Heart failure Father   . Colon cancer Neg Hx   . Stomach cancer Neg Hx   . Esophageal cancer Neg Hx    Vitals:   08/23/18 1210  BP: (!) 154/90  Pulse: 80  SpO2: 96%  Weight: 60.1 kg (132 lb 9.6 oz)    Wt Readings from Last 3 Encounters:  08/23/18 60.1 kg (132 lb 9.6 oz)  06/23/18 61.2 kg (135 lb)  06/19/18 61.3 kg (135 lb 3.2 oz)    PHYSICAL EXAM: General: Appears older than stated age. NAD in WC.  HEENT: Normal Neck: Supple. JVP 8-9 cm. Carotids 2+ bilat; no bruits. No thyromegaly or nodule noted. Cor: PMI nondisplaced. RRR, No M/G/R noted Lungs: Mild basilar crackles.  Abdomen: Soft, non-tender, non-distended, no HSM. No bruits or masses. +BS  Extremities: No cyanosis, clubbing, or rash. 1+ BLE edema, L slightly > than R.  Neuro: Alert & orientedx3, cranial nerves grossly intact. moves all 4 extremities w/o difficulty. Affect pleasant   ASSESSMENT & PLAN:  1. Chronic systolic HF with R>L symptoms - Etiology unclear. Echo suggestive of cardiac amyloid but w/u negative. Ischemic and hypertensive CM also possible. Unable to cath with A/CKD.   - PYP scan 02/13/18 Grade 1, HCL 1.02. Negative for TTR amyloid. Personally reviewed by Dr. Haroldine Laws. Interestingly, her brother has confirmed "Wild-Type" TTR Amyloid.  - Myeloma panel negative. Unable to get MRI due to CKD - EF 2016 was 70% - ECHO 02/07/18 EF 25-30% - Echo  today shows LVEF 25-30%, RV mild to moderately decreased. May need ICD consideration, but mortality may preclude her from anything but SubQ ICD.  - Myoview 8/19 notable for mild to moderate anterior ischemia - cannot exclude breast attenuation. Given lack of CP and creatinine ~3 will treat medically. - NYHA III-IIIb symptoms chronically - Volume status looks OK. She has a very narrow euvolemic window. We have been  unable to diurese her any more thoroughly with out significant AKI.   - Taking torsemide 40 mg daily, and then an additional 20 mg in the evening as needed. Takes about 3 times a week. Continue sliding scale - Continue metolazone as needed - Continue coreg 6.25 mg BID - Continue hydralazine 50 mg TID - Continue imdur 30 mg daily  - No ACE/ARB/ARNI/spiro with AKI. BMET today - Reinforced fluid restriction to < 2 L daily, sodium restriction to less than 2000 mg daily, and the importance of daily weights.   2. CKD with recent AKI - Baseline creatinine ~2.5. Followed by Dr Hollie Salk. Peaked at 3.1 during admission.  - Cr 3.22 08/10/18. Improved from December with decrease in diuretics.  - BMET today 3. Probable OSA - Referred for sleep study.  4. HTN - Meds as above.  - Slightly elevated in clinic, but runs in 130s at home which is stable.  5.DM2 - Per PCP 6. Severe Deconditioning - Continue HHPT.  No room to adjust medication with labile kidney function. She has not tolerate any more aggressive diuresis. Continue sliding scale diuretics. She has a good handle on where she feels good, fluid wise. BP at home running in 130s mostly. OK for now.   Belinda Friar, PA-C 08/23/18   Patient seen and examined with the above-signed Advanced Practice Provider and/or Housestaff. I personally reviewed laboratory data, imaging studies and relevant notes. I independently examined the patient and formulated the important aspects of the plan. I have edited the note to reflect any of my changes or salient points. I have personally discussed the plan with the patient and/or family.  She remains tenuous. Echo today with stable EF 25-30%. Recently diuresed over 60 pounds and has kept it off. I have been concerned about possible cardiac amyloidosis. But we reviewed PYP scan again today and it is completely negative. Creatinine remains ~3.5. I think this is about as good as we can get her. Would not push  volume or BP any lower as we would run the risk of accelerating her CKD progression. Not candidate for ICD with impending ESRD.   Glori Bickers, MD  12:38 PM

## 2018-08-23 NOTE — Progress Notes (Signed)
  Echocardiogram 2D Echocardiogram has been performed.  Belinda [name] 08/23/2018, 12:06 PM

## 2018-08-23 NOTE — Patient Instructions (Signed)
Your physician recommends that you schedule a follow-up appointment in: 4 MONTHS.

## 2018-08-31 ENCOUNTER — Other Ambulatory Visit: Payer: Self-pay | Admitting: Rehabilitation

## 2018-08-31 DIAGNOSIS — M542 Cervicalgia: Secondary | ICD-10-CM

## 2018-09-01 ENCOUNTER — Ambulatory Visit
Admission: RE | Admit: 2018-09-01 | Discharge: 2018-09-01 | Disposition: A | Discharge status: Home / Self Care | Payer: Medicare Other | Source: Ambulatory Visit | Attending: Rehabilitation | Admitting: Rehabilitation

## 2018-09-01 DIAGNOSIS — M542 Cervicalgia: Secondary | ICD-10-CM

## 2018-09-06 ENCOUNTER — Other Ambulatory Visit: Payer: Self-pay

## 2018-09-06 ENCOUNTER — Ambulatory Visit (HOSPITAL_COMMUNITY)
Admission: RE | Admit: 2018-09-06 | Discharge: 2018-09-06 | Disposition: A | Discharge status: Home / Self Care | Payer: Medicare Other | Source: Ambulatory Visit | Attending: Nephrology | Admitting: Nephrology

## 2018-09-06 VITALS — BP 149/93 | HR 88 | Temp 98.0°F | Resp 20

## 2018-09-06 DIAGNOSIS — N183 Chronic kidney disease, stage 3 unspecified: Secondary | ICD-10-CM

## 2018-09-06 DIAGNOSIS — D631 Anemia in chronic kidney disease: Secondary | ICD-10-CM | POA: Diagnosis present

## 2018-09-06 LAB — POCT HEMOGLOBIN-HEMACUE: Hemoglobin: 11.7 g/dL — ABNORMAL LOW (ref 12.0–15.0)

## 2018-09-06 LAB — FERRITIN: Ferritin: 419 ng/mL — ABNORMAL HIGH (ref 11–307)

## 2018-09-06 LAB — IRON AND TIBC
Iron: 71 ug/dL (ref 28–170)
Saturation Ratios: 28 % (ref 10.4–31.8)
TIBC: 255 ug/dL (ref 250–450)
UIBC: 184 ug/dL

## 2018-09-06 MED ORDER — DARBEPOETIN ALFA 200 MCG/0.4ML IJ SOSY
PREFILLED_SYRINGE | INTRAMUSCULAR | Status: AC
  Filled 2018-09-06: qty 0.4

## 2018-09-06 MED ORDER — DARBEPOETIN ALFA 200 MCG/0.4ML IJ SOSY
200.0000 ug | PREFILLED_SYRINGE | INTRAMUSCULAR | Status: DC
  Administered 2018-09-06: 200 ug via SUBCUTANEOUS

## 2018-09-07 ENCOUNTER — Encounter (HOSPITAL_COMMUNITY): Payer: Self-pay

## 2018-09-07 ENCOUNTER — Encounter (HOSPITAL_COMMUNITY): Payer: Medicare Other

## 2018-09-23 ENCOUNTER — Other Ambulatory Visit (HOSPITAL_COMMUNITY): Payer: Self-pay | Admitting: Student

## 2018-10-04 ENCOUNTER — Encounter (HOSPITAL_COMMUNITY): Payer: Medicare Other

## 2018-10-05 ENCOUNTER — Encounter (HOSPITAL_COMMUNITY): Payer: Medicare Other

## 2018-12-18 ENCOUNTER — Ambulatory Visit (HOSPITAL_COMMUNITY)
Admission: RE | Admit: 2018-12-18 | Discharge: 2018-12-18 | Disposition: A | Discharge status: Home / Self Care | Payer: Medicare Other | Source: Ambulatory Visit | Attending: Internal Medicine | Admitting: Internal Medicine

## 2018-12-18 ENCOUNTER — Telehealth (HOSPITAL_COMMUNITY): Payer: Self-pay

## 2018-12-18 ENCOUNTER — Other Ambulatory Visit: Payer: Self-pay

## 2018-12-18 ENCOUNTER — Encounter (HOSPITAL_COMMUNITY): Payer: Self-pay | Admitting: Internal Medicine

## 2018-12-18 VITALS — BP 134/78 | HR 88 | Wt 127.6 lb

## 2018-12-18 DIAGNOSIS — Z881 Allergy status to other antibiotic agents status: Secondary | ICD-10-CM | POA: Insufficient documentation

## 2018-12-18 DIAGNOSIS — N184 Chronic kidney disease, stage 4 (severe): Secondary | ICD-10-CM | POA: Diagnosis not present

## 2018-12-18 DIAGNOSIS — I272 Pulmonary hypertension, unspecified: Secondary | ICD-10-CM | POA: Insufficient documentation

## 2018-12-18 DIAGNOSIS — I130 Hypertensive heart and chronic kidney disease with heart failure and stage 1 through stage 4 chronic kidney disease, or unspecified chronic kidney disease: Secondary | ICD-10-CM | POA: Diagnosis not present

## 2018-12-18 DIAGNOSIS — E785 Hyperlipidemia, unspecified: Secondary | ICD-10-CM | POA: Diagnosis not present

## 2018-12-18 DIAGNOSIS — Z886 Allergy status to analgesic agent status: Secondary | ICD-10-CM | POA: Insufficient documentation

## 2018-12-18 DIAGNOSIS — N189 Chronic kidney disease, unspecified: Secondary | ICD-10-CM

## 2018-12-18 DIAGNOSIS — Z79899 Other long term (current) drug therapy: Secondary | ICD-10-CM | POA: Diagnosis not present

## 2018-12-18 DIAGNOSIS — I2721 Secondary pulmonary arterial hypertension: Secondary | ICD-10-CM

## 2018-12-18 DIAGNOSIS — Z8249 Family history of ischemic heart disease and other diseases of the circulatory system: Secondary | ICD-10-CM | POA: Insufficient documentation

## 2018-12-18 DIAGNOSIS — Z8614 Personal history of Methicillin resistant Staphylococcus aureus infection: Secondary | ICD-10-CM | POA: Diagnosis not present

## 2018-12-18 DIAGNOSIS — K219 Gastro-esophageal reflux disease without esophagitis: Secondary | ICD-10-CM | POA: Diagnosis not present

## 2018-12-18 DIAGNOSIS — Z794 Long term (current) use of insulin: Secondary | ICD-10-CM | POA: Diagnosis not present

## 2018-12-18 DIAGNOSIS — Z880 Allergy status to penicillin: Secondary | ICD-10-CM | POA: Diagnosis not present

## 2018-12-18 DIAGNOSIS — Z888 Allergy status to other drugs, medicaments and biological substances status: Secondary | ICD-10-CM | POA: Insufficient documentation

## 2018-12-18 DIAGNOSIS — J45909 Unspecified asthma, uncomplicated: Secondary | ICD-10-CM | POA: Diagnosis not present

## 2018-12-18 DIAGNOSIS — I5022 Chronic systolic (congestive) heart failure: Secondary | ICD-10-CM

## 2018-12-18 DIAGNOSIS — E1122 Type 2 diabetes mellitus with diabetic chronic kidney disease: Secondary | ICD-10-CM | POA: Diagnosis not present

## 2018-12-18 DIAGNOSIS — Z7951 Long term (current) use of inhaled steroids: Secondary | ICD-10-CM | POA: Insufficient documentation

## 2018-12-18 DIAGNOSIS — Z7982 Long term (current) use of aspirin: Secondary | ICD-10-CM | POA: Insufficient documentation

## 2018-12-18 LAB — BRAIN NATRIURETIC PEPTIDE: B Natriuretic Peptide: 990 pg/mL — ABNORMAL HIGH (ref 0.0–100.0)

## 2018-12-18 LAB — BASIC METABOLIC PANEL
Anion gap: 13 (ref 5–15)
BUN: 93 mg/dL — ABNORMAL HIGH (ref 8–23)
CO2: 19 mmol/L — ABNORMAL LOW (ref 22–32)
Calcium: 9.2 mg/dL (ref 8.9–10.3)
Chloride: 103 mmol/L (ref 98–111)
Creatinine, Ser: 4.32 mg/dL — ABNORMAL HIGH (ref 0.44–1.00)
GFR calc Af Amer: 12 mL/min — ABNORMAL LOW (ref >60)
GFR calc non Af Amer: 10 mL/min — ABNORMAL LOW (ref >60)
Glucose, Bld: 239 mg/dL — ABNORMAL HIGH (ref 70–99)
Potassium: 4.5 mmol/L (ref 3.5–5.1)
Sodium: 135 mmol/L (ref 135–145)

## 2018-12-18 NOTE — Progress Notes (Signed)
Patient Name: Belinda [name]        DOB: Jul 05, 1950      Height: 5'1    Weight:127.6lbs  Office Name:         Referring Provider: Benay Spice  Today's Date:12/18/2018  Date:   STOP BANG RISK ASSESSMENT S (snore) Have you been told that you snore?     YES   T (tired) Are you often tired, fatigued, or sleepy during the day?   YES  O (obstruction) Do you stop breathing, choke, or gasp during sleep? NO   P (pressure) Do you have or are you being treated for high blood pressure? YES   B (BMI) Is your body index greater than 35 kg/m? NO   A (age) Are you 22 years old or older? YES   N (neck) Do you have a neck circumference greater than 16 inches?   YES/NO   G (gender) Are you a female? NO   TOTAL STOP/BANG "YES" ANSWERS                                                                        For Office Use Only              Procedure Order Form    YES to 3+ Stop Bang questions OR two clinical symptoms - patient qualifies for [employer] (CPT 95800)     Submit: This Form + Patient Face Sheet + Clinical Note via [facility] or Fax: [phone]         Clinical Notes: Will consult Sleep Specialist and refer for management of therapy due to patient increased risk of Sleep Apnea. Ordering a sleep study due to the following two clinical symptoms: Excessive daytime sleepiness G47.10 / Gastroesophageal reflux K21.9 / Nocturia R35.1 / Morning Headaches G44.221 / Difficulty concentrating R41.840 / Memory problems or poor judgment G31.84 / Personality changes or irritability R45.4 / Loud snoring R06.83 / Depression F32.9 / Unrefreshed by sleep G47.8 / Impotence N52.9 / History of high blood pressure R03.0 / Insomnia G47.00    I understand that I am proceeding with a home sleep apnea test as ordered by my treating physician. I understand that untreated sleep apnea is a serious cardiovascular risk factor and it is my responsibility to perform the test and seek management for sleep apnea. I will  be contacted with the results and be managed for sleep apnea by a local sleep physician. I will be receiving equipment and further instructions from [facility]. I shall promptly ship back the equipment via the included mailing label. I understand my insurance will be billed for the test and as the patient I am responsible for any insurance related out-of-pocket costs incurred. I have been provided with written instructions and can call for additional video or telephonic instruction, with 24-hour availability of qualified personnel to answer any questions: Patient Help Desk [phone].  Patient Signature ______________________________________________________   Date______________________ Patient Telemedicine Verbal Consent

## 2018-12-18 NOTE — Telephone Encounter (Deleted)
Patient Name:         DOB:       Height:     Weight:  Office Name:         Referring Provider:  Today's Date:  Date:   STOP BANG RISK ASSESSMENT S (snore) Have you been told that you snore?     YES   T (tired) Are you often tired, fatigued, or sleepy during the day?   YES  O (obstruction) Do you stop breathing, choke, or gasp during sleep? NO   P (pressure) Do you have or are you being treated for high blood pressure? YES/NO   B (BMI) Is your body index greater than 35 kg/m? YES/NO   A (age) Are you 16 years old or older? YES/NO   N (neck) Do you have a neck circumference greater than 16 inches?   YES/NO   G (gender) Are you a female? YES/NO   TOTAL STOP/BANG "YES" ANSWERS                                                                        For Office Use Only              Procedure Order Form    YES to 3+ Stop Bang questions OR two clinical symptoms - patient qualifies for WatchPAT (CPT 95800)     Submit: This Form + Patient Face Sheet + Clinical Note via CloudPAT or Fax: [phone]         Clinical Notes: Will consult Sleep Specialist and refer for management of therapy due to patient increased risk of Sleep Apnea. Ordering a sleep study due to the following two clinical symptoms: Excessive daytime sleepiness G47.10 / Gastroesophageal reflux K21.9 / Nocturia R35.1 / Morning Headaches G44.221 / Difficulty concentrating R41.840 / Memory problems or poor judgment G31.84 / Personality changes or irritability R45.4 / Loud snoring R06.83 / Depression F32.9 / Unrefreshed by sleep G47.8 / Impotence N52.9 / History of high blood pressure R03.0 / Insomnia G47.00    I understand that I am proceeding with a home sleep apnea test as ordered by my treating physician. I understand that untreated sleep apnea is a serious cardiovascular risk factor and it is my responsibility to perform the test and seek management for sleep apnea. I will be contacted with the results and be managed for  sleep apnea by a local sleep physician. I will be receiving equipment and further instructions from [facility]. I shall promptly ship back the equipment via the included mailing label. I understand my insurance will be billed for the test and as the patient I am responsible for any insurance related out-of-pocket costs incurred. I have been provided with written instructions and can call for additional video or telephonic instruction, with 24-hour availability of qualified personnel to answer any questions: Patient Help Desk [phone].  Patient Signature ______________________________________________________   Date______________________ Patient Telemedicine Verbal Consent

## 2018-12-18 NOTE — Patient Instructions (Addendum)
Labs done today. We will contact you only if labs are abnormal.   Your physician recommends that you schedule a follow-up appointment in: 4 months. We will contact you to schedule your appointment.   Your physician has recommended that you have a sleep study. This test records several body functions during sleep, including: brain activity, eye movement, oxygen and carbon dioxide blood levels, heart rate and rhythm, breathing rate and rhythm, the flow of air through your mouth and nose, snoring, body muscle movements, and chest and belly movement.  Your physician has recommended that you have a pulmonary function test. Pulmonary Function Tests are a group of tests that measure how well air moves in and out of your lungs. You will be contacted to schedule this test as well as for COVID testing which is required for this test.    At the [facility], you and your health needs are our priority. As part of our continuing mission to provide you with exceptional heart care, we have created designated Provider Care Teams. These Care Teams include your primary Cardiologist (physician) and Advanced Practice Providers (APPs- Physician Assistants and Nurse Practitioners) who all work together to provide you with the care you need, when you need it.   You may see any of the following providers on your designated Care Team at your next follow up: [name] Dr Glori Bickers . Dr Loralie Champagne . Darrick Grinder, NP

## 2018-12-18 NOTE — Progress Notes (Signed)
Advanced Heart Failure Clinic Note   PCP: Reynold Bowen, MD PCP-Cardiologist: Pixie Casino, MD  Nephrologist: Dr Hollie Salk  HPI: Belinda [name] is a 68 y.o. 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 AHF team consulted. Diuresed 40 lbs with lasix drip and metolazone. Myeloma panel and PYP scan negative. Myoview as below. No LHC with elevated creatinine. She had AKI with creatinine peak 3.1. She was also treated for a UTI. HF meds adjusted as able, limited by CKD. DC weight: 161 lbs  RHC 12/19 RA = 4 RV = 57/8 PA = 57/26 (40) PCW = 15 Fick cardiac output/index = 5.0/3.2 Thermo CO/CI = 6.22/3.9 PVR = 5.0 WU Ao sat = 96% PA sat = 69%, 71%   She presents today for routine  follow up. She recently has been having trouble with a disc in her neck and says both hands are numb. Was pending surgery at [facility] but on hold with COVID. Says she has been more SOB with the hot weather. Using inhalers more. Says her swelling is much improved on torsemide 40 daily. Has lost 10 pounds. Now taking Boost. No CP, orthopnea or PND. Very weak. Walks with walker.    Studies:   Echo 2/20: EF 25-30% mild to moderate MR RVSP 59. RV ok.   Echo 11/19 EF 25-30% mild MR  Echo (02/07/18) EF 45-50% with mild RV HK. Moderate TR. Small to moderate pericardial effusion echo texture suggestive of possible amyloid.    ABD [place] 04/21/18 with ? Elevated R heart pressures with distended IVC and hepatic veins. Small amount of perihepatic ascites and R medical renal disease.    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  Myeloma Panel negative.   Myoview scan reviewed by Dr Haroldine Laws and notable for mild to moderate anterior ischemia - cannot exclude breast attenuation. Given lack of CP and creatinine ~3 will treat medically.   FH: CAD in mother, CHF in father  SH: No ETOH, tobacco, or drug use   Review of systems complete and found to be negative unless listed in HPI.    Past Medical History:  Diagnosis Date  . Acute CHF (congestive heart failure) ([place]) 02/02/2018  . Anemia    "get monthly injections to boost my HgB" (02/02/2018)  . Anxiety   . Asthma   . Back pain    intractable low back  . Chronic neck pain   . CKD (chronic kidney disease), stage III ([place])   . Dysrhythmia    palpitations or heart racing periodic. has had for years- Dr Mare Ferrari follows.  [name] GERD (gastroesophageal reflux disease)   . History of blood transfusion 2016   After knee replacemnet  . Hx MRSA infection   . Hyperlipidemia   . Hypertension   . Migraines    "~ 2/month" (02/02/2018)  . Osteoarthritis    "knees, neck" (02/02/2018)  . Palpitations   . Pneumonia    "twice in 2018" (02/02/2018)  . Seasonal allergies   . Type II diabetes mellitus (HCC)    insulin dependent - fasting 140-200     Current Outpatient Medications  Medication Sig Dispense Refill  . acetaminophen (TYLENOL) 650 MG CR tablet Take 650 mg by mouth every 8 (eight) hours as needed for pain (pain).     . Alpha-D-Galactosidase (BEANO PO) Take 1-2  tablets by mouth daily as needed (gas).    . Ascorbic Acid (VITAMIN C) 1000 MG tablet Take 1,000 mg by mouth at bedtime.    [name] aspirin 81 MG tablet Take 81 mg by mouth daily.    . Azelastine HCl 0.15 % SOLN Place 1 spray into both nostrils 2 (two) times daily as needed (allergies).     . budesonide-formoterol (SYMBICORT) 160-4.5 MCG/ACT inhaler Inhale 2 puffs into the lungs 2 (two) times daily as needed (shortness of breath).     . carvedilol (COREG) 25 MG tablet Take 12.5 mg by mouth 2 (two) times daily with a meal.    . Cholecalciferol (VITAMIN D-3) 5000 UNITS TABS Take 5,000 Units by mouth daily.    . diclofenac sodium (VOLTAREN) 1 % GEL Apply 1 application topically as needed (pain).    [name] ezetimibe-simvastatin  (VYTORIN) 10-40 MG per tablet Take 1 tablet by mouth at bedtime.     . ferrous gluconate (FERGON) 240 (27 FE) MG tablet Take 240 mg by mouth daily.     . fexofenadine (ALLEGRA) 180 MG tablet Take 180 mg by mouth daily.    [name] guaiFENesin (MUCINEX) 600 MG 12 hr tablet Take 600 mg by mouth daily.    . hydrALAZINE (APRESOLINE) 50 MG tablet TAKE 1 TABLET BY MOUTH THREE TIMES A DAY 270 tablet 1  . insulin lispro (HUMALOG) 100 UNIT/ML injection Inject 2-10 Units into the skin 3 (three) times daily before meals.    . isosorbide mononitrate (IMDUR) 30 MG 24 hr tablet Take 0.5 tablets (15 mg total) by mouth daily. 30 tablet 0  . LANTUS SOLOSTAR 100 UNIT/ML Solostar Pen Inject 12 Units into the skin at bedtime.   3  . metolazone (ZAROXOLYN) 5 MG tablet Take 1 tablet (5 mg total) by mouth every other day. And as needed for weight gain/swelling (Patient taking differently: Take 5 mg by mouth daily as needed (swelling). ) 15 tablet 5  . Multiple Vitamins-Minerals (ALIVE WOMENS ENERGY PO) Take 1 tablet by mouth daily.    . nitroGLYCERIN (NITROSTAT) 0.4 MG SL tablet Place 0.4 mg under the tongue every 5 (five) minutes as needed for chest pain.   0  . potassium chloride (K-DUR) 10 MEQ tablet Take 10-20 mEq by mouth See admin instructions. Take 20 meq in the morning an 10 meq at night    . simethicone (GAS-X) 80 MG chewable tablet Chew 80-160 mg by mouth daily as needed for flatulence.    . torsemide (DEMADEX) 20 MG tablet Take 1 tablet (20 mg total) by mouth 2 (two) times daily. 60 mg in the morning 40mg  in the evening (Patient taking differently: Take 20-60 mg by mouth See admin instructions. ) 60 tablet 6   No current facility-administered medications for this encounter.     Allergies  Allergen Reactions  . Entresto [Sacubitril-Valsartan] Itching  . Omnicef [Cefdinir] Other (See Comments)    GI Upset  . Aspirin Other (See Comments)    GI issues, Can tolerate low aspirin  . Augmentin [Amoxicillin-Pot  Clavulanate] Nausea And Vomiting and Other (See Comments)    GI issues DID THE REACTION INVOLVE: Swelling of the face/tongue/throat, SOB, or low BP? No Sudden or severe rash/hives, skin peeling, or the inside of the mouth or nose? No Did it require medical treatment? No When did it last happen?long time  If all above answers are "NO", may proceed with cephalosporin use.   . Erythromycin Nausea And Vomiting and Other (See Comments)  Gi issues  . Gabapentin Swelling  . Nickel Itching and Rash    Breakouts       Social History   Socioeconomic History  . Marital status: Married    Spouse name: Not on file  . Number of children: 1  . Years of education: Not on file  . Highest education level: Not on file  Occupational History  . Not on file  Social Needs  . Financial resource strain: Not on file  . Food insecurity    Worry: Not on file    Inability: Not on file  . Transportation needs    Medical: Not on file    Non-medical: Not on file  Tobacco Use  . Smoking status: Never Smoker  . Smokeless tobacco: Never Used  Substance and Sexual Activity  . Alcohol use: Never    Frequency: Never  . Drug use: Never  . Sexual activity: Not Currently  Lifestyle  . Physical activity    Days per week: Not on file    Minutes per session: Not on file  . Stress: Not on file  Relationships  . Social Herbalist on phone: Not on file    Gets together: Not on file    Attends religious service: Not on file    Active member of club or organization: Not on file    Attends meetings of clubs or organizations: Not on file    Relationship status: Not on file  . Intimate partner violence    Fear of current or ex partner: Not on file    Emotionally abused: Not on file    Physically abused: Not on file    Forced sexual activity: Not on file  Other Topics Concern  . Not on file  Social History Narrative  . Not on file      Family History  Problem Relation Age of Onset   . Heart disease Mother   . Heart failure Father   . Colon cancer Neg Hx   . Stomach cancer Neg Hx   . Esophageal cancer Neg Hx    Vitals:   12/18/18 1354  BP: 134/78  Pulse: 88  SpO2: 98%  Weight: 57.9 kg (127 lb 9.6 oz)   Wt Readings from Last 3 Encounters:  12/18/18 57.9 kg (127 lb 9.6 oz)  08/23/18 60.1 kg (132 lb 9.6 oz)  06/23/18 61.2 kg (135 lb)    PHYSICAL EXAM: General:  Elderly. Sitting in WC No resp difficulty HEENT: normal Neck: supple. no JVD. Carotids 2+ bilat; no bruits. No lymphadenopathy or thryomegaly appreciated. Cor: PMI nondisplaced. Regular rate & rhythm. No rubs, gallops or murmurs. Lungs: clear Abdomen: soft, nontender, nondistended. No hepatosplenomegaly. No bruits or masses. Good bowel sounds. Extremities: no cyanosis, clubbing, rash, edema Neuro: alert & orientedx3, cranial nerves grossly intact. moves all 4 extremities w/o difficulty. Affect pleasant   ASSESSMENT & PLAN:  1. Chronic systolic HF with R>L symptoms - Etiology unclear. Echo suggestive of cardiac amyloid but w/u negative. Ischemia and hypertensive CM also possible.  - Myoview scan 8/19  notable for mild to moderate anterior ischemia - cannot exclude breast attenuation. 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 Myeloma panel negative.  - ECHO 11/18 EF 45-50% Grade IDD RV mildly dilated. Peak PA pressure 43 mm hg.  - ECHO 02/07/18 EF 25-30% - Echo 2/20 EF 25-30% with global HK - NYHA III  -  Volume status looks good on tor=semide 40 daily. Can take extra as needed.  - Recent RHC with moderate PAH and normal PCWP. Management as per below - Continue coreg 6.25 mg BID - Continue hydralazine to 50 mg TID - Continue imdur 30 mg daily - No ACE/ARB/ARNI/spiro with AKI. BMET today - she is very tenuous. Ideally needs coronary angio and ICD but with poor functional capacity and impending HD these are not options. We discussed this    2. Pulmonary HTN - Will get PFTs and home sleep study. Not candidate for PDE-5 with Imdur use  3. CKD IV - Baseline creatinine ~3.0 Followed by Dr Hollie Salk.   4. Probable OSA - Refer for sleep study  5. HTN - Blood pressure well controlled. Continue current regimen.  6.DM2 - Per PCP   7. Severe Deconditioning - Continue HH PT    Glori Bickers, MD 12/18/18

## 2018-12-18 NOTE — Telephone Encounter (Signed)
error 

## 2018-12-20 ENCOUNTER — Telehealth (HOSPITAL_COMMUNITY): Payer: Self-pay

## 2018-12-20 NOTE — Telephone Encounter (Signed)
Referral faxed over to [facility].

## 2019-01-09 ENCOUNTER — Telehealth (HOSPITAL_COMMUNITY): Payer: Self-pay

## 2019-01-09 NOTE — Telephone Encounter (Signed)
LM for patient to call office to schedule covid screen prior to PFTS that is scheduled for 7/20 1pm.

## 2019-01-10 NOTE — Telephone Encounter (Signed)
Called patient this morning to schedule covid screen in preparation for PFTs.  Pt  states she cannot do PFTs next week as her husband is having surgery.  Message forwarded to [facility] to reschedule. Pt aware she will get a call to reschedule.

## 2019-01-11 ENCOUNTER — Other Ambulatory Visit (HOSPITAL_COMMUNITY): Payer: Self-pay

## 2019-01-11 NOTE — Telephone Encounter (Signed)
Called patient as her PFTs have been rescheduled to 7/28.  Pt agreeable to date and understands covid screen to be done prior.  Pt is scheduled for 7/23 and understands she will have to be quarantined until date of test. Pre pft instructions provided, verbalized understanding of all. Questions answered.

## 2019-01-15 ENCOUNTER — Encounter (HOSPITAL_COMMUNITY): Payer: Medicare Other

## 2019-01-18 ENCOUNTER — Inpatient Hospital Stay (HOSPITAL_COMMUNITY): Admission: RE | Admit: 2019-01-18 | Payer: Medicare Other | Source: Ambulatory Visit

## 2019-01-19 ENCOUNTER — Other Ambulatory Visit (HOSPITAL_COMMUNITY)
Admission: RE | Admit: 2019-01-19 | Discharge: 2019-01-19 | Disposition: A | Discharge status: Home / Self Care | Payer: Medicare Other | Source: Ambulatory Visit | Attending: Internal Medicine | Admitting: Internal Medicine

## 2019-01-19 DIAGNOSIS — Z1159 Encounter for screening for other viral diseases: Secondary | ICD-10-CM | POA: Diagnosis present

## 2019-01-20 LAB — SARS CORONAVIRUS 2 (TAT 6-24 HRS): SARS Coronavirus 2: NEGATIVE

## 2019-01-23 ENCOUNTER — Ambulatory Visit (HOSPITAL_COMMUNITY)
Admission: RE | Admit: 2019-01-23 | Discharge: 2019-01-23 | Disposition: A | Discharge status: Home / Self Care | Payer: Medicare Other | Source: Ambulatory Visit | Attending: Internal Medicine | Admitting: Internal Medicine

## 2019-01-23 ENCOUNTER — Other Ambulatory Visit: Payer: Self-pay

## 2019-01-23 DIAGNOSIS — I5022 Chronic systolic (congestive) heart failure: Secondary | ICD-10-CM | POA: Diagnosis present

## 2019-01-23 LAB — PULMONARY FUNCTION TEST
DL/VA % pred: 104 %
DL/VA: 4.43 ml/min/mmHg/L
DLCO cor % pred: 66 %
DLCO cor: 11.87 ml/min/mmHg
DLCO unc % pred: 53 %
DLCO unc: 9.42 ml/min/mmHg
FEF 25-75 Post: 0.91 L/sec
FEF 25-75 Pre: 1.32 L/sec
FEF2575-%Change-Post: -30 %
FEF2575-%Pred-Post: 57 %
FEF2575-%Pred-Pre: 82 %
FEV1-%Change-Post: -7 %
FEV1-%Pred-Post: 71 %
FEV1-%Pred-Pre: 77 %
FEV1-Post: 1.17 L
FEV1-Pre: 1.28 L
FEV1FVC-%Change-Post: 4 %
FEV1FVC-%Pred-Pre: 103 %
FEV6-%Change-Post: -9 %
FEV6-%Pred-Post: 68 %
FEV6-%Pred-Pre: 76 %
FEV6-Post: 1.4 L
FEV6-Pre: 1.55 L
FEV6FVC-%Change-Post: 1 %
FEV6FVC-%Pred-Post: 104 %
FEV6FVC-%Pred-Pre: 102 %
FVC-%Change-Post: -11 %
FVC-%Pred-Post: 65 %
FVC-%Pred-Pre: 74 %
FVC-Post: 1.4 L
FVC-Pre: 1.58 L
Post FEV1/FVC ratio: 84 %
Post FEV6/FVC ratio: 100 %
Pre FEV1/FVC ratio: 81 %
Pre FEV6/FVC Ratio: 98 %

## 2019-01-23 MED ORDER — ALBUTEROL SULFATE (2.5 MG/3ML) 0.083% IN NEBU
2.5000 mg | INHALATION_SOLUTION | Freq: Once | RESPIRATORY_TRACT | Status: AC
  Administered 2019-01-23: 2.5 mg via RESPIRATORY_TRACT

## 2019-01-26 ENCOUNTER — Other Ambulatory Visit (HOSPITAL_COMMUNITY): Payer: Self-pay

## 2019-01-26 ENCOUNTER — Other Ambulatory Visit (HOSPITAL_COMMUNITY): Payer: Self-pay | Admitting: *Deleted

## 2019-01-29 ENCOUNTER — Ambulatory Visit (HOSPITAL_COMMUNITY)
Admission: RE | Admit: 2019-01-29 | Discharge: 2019-01-29 | Disposition: A | Discharge status: Home / Self Care | Payer: Medicare Other | Source: Ambulatory Visit | Attending: Nephrology | Admitting: Nephrology

## 2019-01-29 ENCOUNTER — Other Ambulatory Visit: Payer: Self-pay

## 2019-01-29 VITALS — BP 166/105 | HR 95 | Temp 97.6°F | Resp 18 | Ht 60.0 in | Wt 125.0 lb

## 2019-01-29 DIAGNOSIS — N183 Chronic kidney disease, stage 3 unspecified: Secondary | ICD-10-CM

## 2019-01-29 DIAGNOSIS — D631 Anemia in chronic kidney disease: Secondary | ICD-10-CM | POA: Diagnosis present

## 2019-01-29 LAB — COMPREHENSIVE METABOLIC PANEL
ALT: 26 U/L (ref 0–44)
AST: 30 U/L (ref 15–41)
Albumin: 3.5 g/dL (ref 3.5–5.0)
Alkaline Phosphatase: 116 U/L (ref 38–126)
Anion gap: 11 (ref 5–15)
BUN: 54 mg/dL — ABNORMAL HIGH (ref 8–23)
CO2: 20 mmol/L — ABNORMAL LOW (ref 22–32)
Calcium: 9 mg/dL (ref 8.9–10.3)
Chloride: 108 mmol/L (ref 98–111)
Creatinine, Ser: 3.77 mg/dL — ABNORMAL HIGH (ref 0.44–1.00)
GFR calc Af Amer: 14 mL/min — ABNORMAL LOW (ref >60)
GFR calc non Af Amer: 12 mL/min — ABNORMAL LOW (ref >60)
Glucose, Bld: 411 mg/dL — ABNORMAL HIGH (ref 70–99)
Potassium: 5 mmol/L (ref 3.5–5.1)
Sodium: 139 mmol/L (ref 135–145)
Total Bilirubin: 0.7 mg/dL (ref 0.3–1.2)
Total Protein: 6.8 g/dL (ref 6.5–8.1)

## 2019-01-29 LAB — CBC
HCT: 27.5 % — ABNORMAL LOW (ref 36.0–46.0)
Hemoglobin: 8.8 g/dL — ABNORMAL LOW (ref 12.0–15.0)
MCH: 26.6 pg (ref 26.0–34.0)
MCHC: 32 g/dL (ref 30.0–36.0)
MCV: 83.1 fL (ref 80.0–100.0)
Platelets: 217 10*3/uL (ref 150–400)
RBC: 3.31 MIL/uL — ABNORMAL LOW (ref 3.87–5.11)
RDW: 18 % — ABNORMAL HIGH (ref 11.5–15.5)
WBC: 6.1 10*3/uL (ref 4.0–10.5)
nRBC: 0.5 % — ABNORMAL HIGH (ref 0.0–0.2)

## 2019-01-29 LAB — PREPARE RBC (CROSSMATCH)

## 2019-01-29 MED ORDER — ACETAMINOPHEN 325 MG PO TABS
650.0000 mg | ORAL_TABLET | Freq: Once | ORAL | Status: AC
  Administered 2019-01-29: 650 mg via ORAL

## 2019-01-29 MED ORDER — ACETAMINOPHEN 325 MG PO TABS
ORAL_TABLET | ORAL | Status: AC
  Filled 2019-01-29: qty 2

## 2019-01-29 MED ORDER — DIPHENHYDRAMINE HCL 25 MG PO CAPS
ORAL_CAPSULE | ORAL | Status: AC
  Filled 2019-01-29: qty 1

## 2019-01-29 MED ORDER — DARBEPOETIN ALFA 200 MCG/0.4ML IJ SOSY
PREFILLED_SYRINGE | INTRAMUSCULAR | Status: AC
  Filled 2019-01-29: qty 0.4

## 2019-01-29 MED ORDER — DIPHENHYDRAMINE HCL 25 MG PO CAPS
25.0000 mg | ORAL_CAPSULE | Freq: Once | ORAL | Status: AC
  Administered 2019-01-29: 25 mg via ORAL

## 2019-01-29 MED ORDER — SODIUM CHLORIDE 0.9% IV SOLUTION: Freq: Once | INTRAVENOUS | Status: DC

## 2019-01-29 MED ORDER — DARBEPOETIN ALFA 200 MCG/0.4ML IJ SOSY
200.0000 ug | PREFILLED_SYRINGE | INTRAMUSCULAR | Status: DC
  Administered 2019-01-29: 200 ug via SUBCUTANEOUS

## 2019-01-30 LAB — BPAM RBC
Blood Product Expiration Date: 202008222359
ISSUE DATE / TIME: 202008030921
Unit Type and Rh: 6200

## 2019-01-30 LAB — TYPE AND SCREEN
ABO/RH(D): A POS
Antibody Screen: NEGATIVE
Unit division: 0

## 2019-02-08 ENCOUNTER — Other Ambulatory Visit (HOSPITAL_COMMUNITY): Payer: Self-pay

## 2019-02-08 MED ORDER — TORSEMIDE 20 MG PO TABS: 20.0000 mg | ORAL_TABLET | Freq: Two times a day (BID) | ORAL | 6 refills | Status: DC

## 2019-02-23 ENCOUNTER — Encounter (HOSPITAL_COMMUNITY): Payer: Medicare Other

## 2019-02-26 ENCOUNTER — Encounter (HOSPITAL_COMMUNITY): Payer: Medicare Other

## 2019-02-26 ENCOUNTER — Ambulatory Visit (HOSPITAL_COMMUNITY)
Admission: RE | Admit: 2019-02-26 | Discharge: 2019-02-26 | Disposition: A | Discharge status: Home / Self Care | Payer: Medicare Other | Source: Ambulatory Visit | Attending: Nephrology | Admitting: Nephrology

## 2019-02-26 ENCOUNTER — Other Ambulatory Visit: Payer: Self-pay

## 2019-02-26 VITALS — BP 146/97 | HR 90 | Temp 97.7°F | Resp 18

## 2019-02-26 DIAGNOSIS — D631 Anemia in chronic kidney disease: Secondary | ICD-10-CM | POA: Diagnosis present

## 2019-02-26 DIAGNOSIS — N183 Chronic kidney disease, stage 3 unspecified: Secondary | ICD-10-CM

## 2019-02-26 LAB — COMPREHENSIVE METABOLIC PANEL
ALT: 22 U/L (ref 0–44)
AST: 22 U/L (ref 15–41)
Albumin: 3.6 g/dL (ref 3.5–5.0)
Alkaline Phosphatase: 119 U/L (ref 38–126)
Anion gap: 14 (ref 5–15)
BUN: 51 mg/dL — ABNORMAL HIGH (ref 8–23)
CO2: 23 mmol/L (ref 22–32)
Calcium: 9.4 mg/dL (ref 8.9–10.3)
Chloride: 100 mmol/L (ref 98–111)
Creatinine, Ser: 3.79 mg/dL — ABNORMAL HIGH (ref 0.44–1.00)
GFR calc Af Amer: 13 mL/min — ABNORMAL LOW (ref >60)
GFR calc non Af Amer: 12 mL/min — ABNORMAL LOW (ref >60)
Glucose, Bld: 356 mg/dL — ABNORMAL HIGH (ref 70–99)
Potassium: 4.4 mmol/L (ref 3.5–5.1)
Sodium: 137 mmol/L (ref 135–145)
Total Bilirubin: 1.4 mg/dL — ABNORMAL HIGH (ref 0.3–1.2)
Total Protein: 6.5 g/dL (ref 6.5–8.1)

## 2019-02-26 LAB — IRON AND TIBC
Iron: 43 ug/dL (ref 28–170)
Saturation Ratios: 16 % (ref 10.4–31.8)
TIBC: 272 ug/dL (ref 250–450)
UIBC: 229 ug/dL

## 2019-02-26 LAB — FERRITIN: Ferritin: 535 ng/mL — ABNORMAL HIGH (ref 11–307)

## 2019-02-26 LAB — POCT HEMOGLOBIN-HEMACUE: Hemoglobin: 11 g/dL — ABNORMAL LOW (ref 12.0–15.0)

## 2019-02-26 MED ORDER — DARBEPOETIN ALFA 200 MCG/0.4ML IJ SOSY
200.0000 ug | PREFILLED_SYRINGE | INTRAMUSCULAR | Status: DC
  Administered 2019-02-26: 200 ug via SUBCUTANEOUS

## 2019-02-26 MED ORDER — DARBEPOETIN ALFA 200 MCG/0.4ML IJ SOSY
PREFILLED_SYRINGE | INTRAMUSCULAR | Status: AC
  Filled 2019-02-26: qty 0.4

## 2019-03-03 ENCOUNTER — Other Ambulatory Visit (HOSPITAL_COMMUNITY): Payer: Self-pay | Admitting: Cardiology

## 2019-03-15 ENCOUNTER — Telehealth: Payer: Self-pay

## 2019-03-15 NOTE — Telephone Encounter (Signed)

## 2019-03-15 NOTE — Progress Notes (Deleted)
{Choose 1 Note Type (Telehealth Visit or Telephone Visit):[phone]}   Date:  03/15/2019   ID:  Belinda [name], DOB January 12, 1951, MRN [id number]  {Patient [phone]::"Home"} {Provider Location:[phone]::"Home"}  PCP:  Reynold Bowen, MD  Cardiologist:  Pixie Casino, MD *** Electrophysiologist:  None  Advanced Heart Failure Clinic:  Glori Bickers, MD     Evaluation Performed:  {Choose Visit NOBS:[id number]::"TMLYYT-KP Visit"}  Chief Complaint:  ***  History of Present Illness:    Belinda [name] is a 68 y.o. 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 Haroldine Laws in the advanced heart failure clinic in June 2020.***  The patient {does/does not:200015} have symptoms concerning for COVID-19 infection (fever, chills, cough, or new shortness of breath).    Past Medical History:  Diagnosis Date  . Acute CHF (congestive heart failure) ([place]) 02/02/2018  . Anemia    "get monthly injections to boost my HgB" (02/02/2018)  . Anxiety   . Asthma   . Back pain    intractable low back  . Chronic neck pain   . CKD (chronic kidney disease), stage III ([place])   . Dysrhythmia    palpitations or heart racing periodic. has had for years- Dr Mare Ferrari follows.  [name] GERD (gastroesophageal reflux disease)   . History of blood transfusion 2016   After knee replacemnet  . Hx MRSA infection   . Hyperlipidemia   . Hypertension   . Migraines    "~ 2/month" (02/02/2018)  .  Osteoarthritis    "knees, neck" (02/02/2018)  . Palpitations   . Pneumonia    "twice in 2018" (02/02/2018)  . Seasonal allergies   . Type II diabetes mellitus (HCC)    insulin dependent - fasting 140-200    Past Surgical History:  Procedure Laterality Date  . ABDOMINAL HYSTERECTOMY  03/2001   Abdominal supracervical hysterectomy, left salpingo-oophorectomy  . ANTERIOR CERVICAL DECOMP/DISCECTOMY FUSION N/A 01/01/2014   Procedure: CERVICAL THREE-FOUR,CERVICAL FIVE-SIX,CERVICAL SIX-SEVEN ANTERIOR CERVICAL DECOMPRESSION/DISCECTOMY/FUSION;  Surgeon: Kristeen Miss, MD;  Location: [facility];  Service: Neurosurgery;  Laterality: N/A;  left-side approach  . BACK SURGERY    . CATARACT EXTRACTION W/ INTRAOCULAR LENS  IMPLANT, BILATERAL Bilateral 2012  . CESAREAN SECTION    . DILATION AND CURETTAGE OF UTERUS  X 2  . HAND SURGERY Left 1970s   laceration repair 4th and 5th digits  . JOINT REPLACEMENT    . KNEE ARTHROSCOPY Left    torn mensicus  . NASAL SINUS SURGERY    . RIGHT HEART CATH N/A 06/23/2018   Procedure: RIGHT HEART CATH;  Surgeon: Jolaine Artist, MD;  Location: [facility];  Service: Cardiovascular;  Laterality: N/A;  . TOTAL KNEE ARTHROPLASTY Left 02/12/2015   Procedure: TOTAL KNEE ARTHROPLASTY;  Surgeon: [name], MD;  Location: [facility];  Service: Orthopedics;  Laterality: Left;     No outpatient medications have been marked as taking for the 03/16/19 encounter (Appointment) with Richardson Dopp T, PA-C.  Allergies:   Entresto [sacubitril-valsartan], Omnicef [cefdinir], Aspirin, Augmentin [amoxicillin-pot clavulanate], Erythromycin, Gabapentin, and Nickel   Social History   Tobacco Use  . Smoking status: Never Smoker  . Smokeless tobacco: Never Used  Substance Use Topics  . Alcohol use: Never    Frequency: Never  . Drug use: Never     Family Hx: The patient's family history includes Heart disease in her mother; Heart failure in her father. There is no  history of Colon cancer, Stomach cancer, or Esophageal cancer.  ROS:   Please see the history of present illness.    *** All other systems reviewed and are negative.   Prior CV studies:   The following studies were reviewed today:  *** Echocardiogram 08/23/2018 EF 61-60, grade 2 diastolic dysfunction, global HK without RWMA, normal RV SF, RVSP 59, mild LAE, small circumferential pericardial effusion, mild to moderate MR  R Cardiac catheterization 06/23/18 RA = 4 RV = 57/8 PA = 57/26 (40) PCW = 15 Fick cardiac output/index = 5.0/3.2 Thermo CO/CI = 6.22/3.9 PVR = 5.0 WU Ao sat = 96% PA sat = 69%, 71% Assessment: 1. Mild to moderate PAH with normal PCWP and normal outputs  Echocardiogram 05/15/2018 Mild concentric LVH, EF 25-30, diffuse HK, grade 2 diastolic dysfunction, mild AI, mild MR, mildly reduced RV SF, moderate RAE, moderate TR, PASP 58, mild circumferential pericardial effusion  Myoview 02/16/2018 IMPRESSION: 1. Concern for mild to moderate reversibility involving the anterior wall. Concern for mild reversibility in the inferior wall. 2. Diffuse hypokinesia throughout the left ventricle. 3. Left ventricular ejection fraction is 30%. 4. Non invasive risk stratification*: High  PYP scan 02/13/2018 IMPRESSION: Visual and quantitative assessment (grade 1, H/CLL equal 1.0 ) are equivocal for transthyretin amyloidosis. Consider follow-up imaging in 6-12 months or as clinically indicated.  Of note: A negative or mildly positive PYP does not exclude AL amyloid. In addition, equivocal results could represent AL amyloid or early TTR amyloid  Myoview 02/06/2015 EF 70, no ischemia, low risk    Labs/Other Tests and Data Reviewed:    EKG:  {EKG/Telemetry Strips [phone]}  Recent Labs: 12/18/2018: B Natriuretic Peptide 990.0 01/29/2019: Platelets 217 02/26/2019: ALT 22; BUN 51; Creatinine, Ser 3.79; Hemoglobin 11.0; Potassium 4.4; Sodium 137   Recent Lipid  Panel No results found for: CHOL, TRIG, HDL, CHOLHDL, LDLCALC, LDLDIRECT  Wt Readings from Last 3 Encounters:  01/29/19 125 lb (56.7 kg)  12/18/18 127 lb 9.6 oz (57.9 kg)  08/23/18 132 lb 9.6 oz (60.1 kg)     Objective:    Vital Signs:  There were no vitals taken for this visit.   {HeartCare Virtual Exam (Optional):[phone]::"VITAL SIGNS:  reviewed"}  ASSESSMENT & PLAN:    1. ***  COVID-19 Education: The signs and symptoms of COVID-19 were discussed with the patient and how to seek care for testing (follow up with PCP or arrange E-visit).  ***The importance of social distancing was discussed today.  Time:   Today, I have spent *** minutes with the patient with telehealth technology discussing the above problems.     Medication Adjustments/Labs and Tests Ordered: Current medicines are reviewed at length with the patient today.  Concerns regarding medicines are outlined above.   Tests Ordered: No orders of the defined types were placed in this encounter.   Medication Changes: No orders of the defined types were placed in this encounter.   Follow Up:  {F/U Format:[phone]} {follow up:15908}  Signed, Richardson Dopp, PA-C  03/15/2019 5:08 PM  [facility]

## 2019-03-16 ENCOUNTER — Telehealth: Payer: Medicare Other | Admitting: Physician Assistant

## 2019-03-19 ENCOUNTER — Other Ambulatory Visit: Payer: Self-pay | Admitting: Nephrology

## 2019-03-19 ENCOUNTER — Ambulatory Visit
Admission: RE | Admit: 2019-03-19 | Discharge: 2019-03-19 | Disposition: A | Discharge status: Home / Self Care | Payer: Medicare Other | Source: Ambulatory Visit | Attending: Nephrology | Admitting: Nephrology

## 2019-03-19 DIAGNOSIS — I159 Secondary hypertension, unspecified: Secondary | ICD-10-CM

## 2019-03-19 DIAGNOSIS — N189 Chronic kidney disease, unspecified: Secondary | ICD-10-CM

## 2019-03-26 ENCOUNTER — Encounter (HOSPITAL_COMMUNITY): Payer: Medicare Other

## 2019-03-28 ENCOUNTER — Other Ambulatory Visit: Payer: Self-pay

## 2019-03-28 ENCOUNTER — Encounter (HOSPITAL_COMMUNITY)
Admission: RE | Admit: 2019-03-28 | Discharge: 2019-03-28 | Disposition: A | Discharge status: Home / Self Care | Payer: Medicare Other | Source: Ambulatory Visit | Attending: Nephrology | Admitting: Nephrology

## 2019-03-28 VITALS — BP 150/91 | HR 87 | Temp 97.1°F | Resp 18

## 2019-03-28 DIAGNOSIS — N183 Chronic kidney disease, stage 3 unspecified: Secondary | ICD-10-CM

## 2019-03-28 DIAGNOSIS — D631 Anemia in chronic kidney disease: Secondary | ICD-10-CM | POA: Insufficient documentation

## 2019-03-28 LAB — IRON AND TIBC
Iron: 57 ug/dL (ref 28–170)
Saturation Ratios: 20 % (ref 10.4–31.8)
TIBC: 291 ug/dL (ref 250–450)
UIBC: 234 ug/dL

## 2019-03-28 LAB — POCT HEMOGLOBIN-HEMACUE: Hemoglobin: 10.3 g/dL — ABNORMAL LOW (ref 12.0–15.0)

## 2019-03-28 LAB — FERRITIN: Ferritin: 341 ng/mL — ABNORMAL HIGH (ref 11–307)

## 2019-03-28 MED ORDER — DARBEPOETIN ALFA 200 MCG/0.4ML IJ SOSY
PREFILLED_SYRINGE | INTRAMUSCULAR | Status: AC
  Filled 2019-03-28: qty 0.4

## 2019-03-28 MED ORDER — DARBEPOETIN ALFA 200 MCG/0.4ML IJ SOSY
200.0000 ug | PREFILLED_SYRINGE | INTRAMUSCULAR | Status: DC
  Administered 2019-03-28: 200 ug via SUBCUTANEOUS

## 2019-04-25 ENCOUNTER — Other Ambulatory Visit: Payer: Self-pay

## 2019-04-25 ENCOUNTER — Ambulatory Visit (HOSPITAL_COMMUNITY)
Admission: RE | Admit: 2019-04-25 | Discharge: 2019-04-25 | Disposition: A | Discharge status: Home / Self Care | Payer: Medicare Other | Source: Ambulatory Visit | Attending: Nephrology | Admitting: Nephrology

## 2019-04-25 VITALS — BP 149/91 | HR 85 | Temp 96.3°F | Resp 18

## 2019-04-25 DIAGNOSIS — D631 Anemia in chronic kidney disease: Secondary | ICD-10-CM | POA: Diagnosis present

## 2019-04-25 DIAGNOSIS — N183 Chronic kidney disease, stage 3 unspecified: Secondary | ICD-10-CM | POA: Insufficient documentation

## 2019-04-25 LAB — IRON AND TIBC
Iron: 86 ug/dL (ref 28–170)
Saturation Ratios: 29 % (ref 10.4–31.8)
TIBC: 297 ug/dL (ref 250–450)
UIBC: 211 ug/dL

## 2019-04-25 LAB — FERRITIN: Ferritin: 306 ng/mL (ref 11–307)

## 2019-04-25 LAB — POCT HEMOGLOBIN-HEMACUE: Hemoglobin: 11.5 g/dL — ABNORMAL LOW (ref 12.0–15.0)

## 2019-04-25 MED ORDER — DARBEPOETIN ALFA 200 MCG/0.4ML IJ SOSY
PREFILLED_SYRINGE | INTRAMUSCULAR | Status: AC
  Filled 2019-04-25: qty 0.4

## 2019-04-25 MED ORDER — DARBEPOETIN ALFA 200 MCG/0.4ML IJ SOSY
200.0000 ug | PREFILLED_SYRINGE | INTRAMUSCULAR | Status: DC
  Administered 2019-04-25: 200 ug via SUBCUTANEOUS

## 2019-04-26 LAB — PTH, INTACT AND CALCIUM
Calcium, Total (PTH): 9.4 mg/dL (ref 8.7–10.3)
PTH: 92 pg/mL — ABNORMAL HIGH (ref 15–65)

## 2019-05-12 IMAGING — NM NM TUMOR LOCAL/TRACER DISTR WITH SPECT
4 series · 19 of 19 positions shown · non-contrast
Comparison: none

CLINICAL DATA: HEART FAILURE. CONCERN FOR CARDIAC AMYLOIDOSIS.

EXAM:
NUCLEAR MEDICINE TUMOR LOCALIZATION. PYP CARDIAC AMYLOIDOSIS SCAN
WITH SPECT
TECHNIQUE: Following intravenous administration of radiopharmaceutical,
anterior planar images of the chest were obtained. Regions of
interest were placed on the heart and contralateral chest wall for
quantitative assessment. Additional SPECT imaging of the chest was
obtained.
RADIOPHARMACEUTICALS:  17.5 mCi TECHNETIUM 99 PYROPHOSPHATE

[Series 1: ant-post · 4.14mm/px · 1 of 1 slices shown]
[im 1/1]
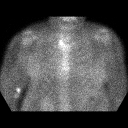

[Series 1: spect - (id)_(id)_cor · 4.1mm · 4.14mm/px · 6 of 128 frames shown]
[frame 11/128]
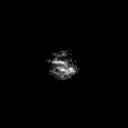
[frame 32/128]
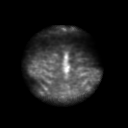
[frame 54/128]
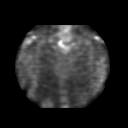
[frame 75/128]
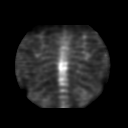
[frame 96/128]
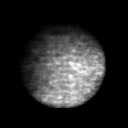
[frame 118/128]
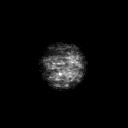

[Series 1: spect - (id)_(id)_tra · 4.1mm · 4.14mm/px · 6 of 128 frames shown]
[frame 11/128]
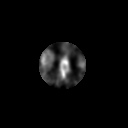
[frame 32/128]
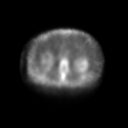
[frame 54/128]
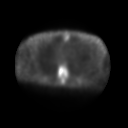
[frame 75/128]
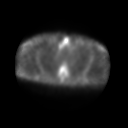
[frame 96/128]
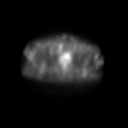
[frame 118/128]
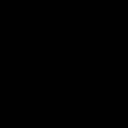

[Series 2: amyloid · 4.14mm/px · 6 of 64 frames shown]
[frame 6/64  full-range]
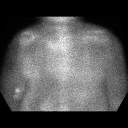
[frame 16/64  full-range]
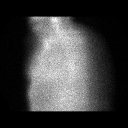
[frame 27/64  full-range]
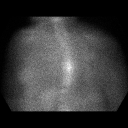
[frame 38/64  full-range]
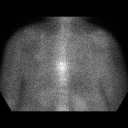
[frame 48/64  full-range]
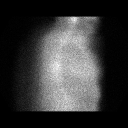
[frame 59/64  full-range]
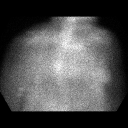

[19 of 19 positions shown; findings below may reference images not displayed]

FINDINGS: Planar Visual assessment:

Anterior planar imaging demonstrates radiotracer uptake within the
heart less than than uptake within the adjacent ribs (Grade 1).

Quantitative assessment :

Quantitative assessment of the cardiac uptake compared to the
contralateral chest wall is equal to 1.02 (H/CL = 1.02).

SPECT assessment: SPECT imaging of the chest demonstrates no
significant radiotracer accumulation within the LEFT ventricle.
IMPRESSION: Visual and quantitative assessment (grade 1, H/CLL equal 1.0 ) are
equivocal for transthyretin amyloidosis. Consider follow-up imaging
in 6-12 months or as clinically indicated.

Of note: A negative or mildly positive PYP does not exclude AL
amyloid. In addition, equivocal results could represent AL amyloid
or early TTR amyloid

## 2019-05-23 ENCOUNTER — Encounter (HOSPITAL_COMMUNITY): Payer: Medicare Other

## 2019-06-08 ENCOUNTER — Other Ambulatory Visit: Payer: Self-pay

## 2019-06-08 ENCOUNTER — Encounter (HOSPITAL_COMMUNITY)
Admission: RE | Admit: 2019-06-08 | Discharge: 2019-06-08 | Disposition: A | Discharge status: Home / Self Care | Payer: Medicare Other | Source: Ambulatory Visit | Attending: Nephrology | Admitting: Nephrology

## 2019-06-08 DIAGNOSIS — N185 Chronic kidney disease, stage 5: Secondary | ICD-10-CM | POA: Diagnosis not present

## 2019-06-08 LAB — COMPREHENSIVE METABOLIC PANEL
ALT: 16 U/L (ref 0–44)
AST: 21 U/L (ref 15–41)
Albumin: 3.4 g/dL — ABNORMAL LOW (ref 3.5–5.0)
Alkaline Phosphatase: 92 U/L (ref 38–126)
Anion gap: 12 (ref 5–15)
BUN: 59 mg/dL — ABNORMAL HIGH (ref 8–23)
CO2: 25 mmol/L (ref 22–32)
Calcium: 9.1 mg/dL (ref 8.9–10.3)
Chloride: 101 mmol/L (ref 98–111)
Creatinine, Ser: 4.53 mg/dL — ABNORMAL HIGH (ref 0.44–1.00)
GFR calc Af Amer: 11 mL/min — ABNORMAL LOW (ref >60)
GFR calc non Af Amer: 9 mL/min — ABNORMAL LOW (ref >60)
Glucose, Bld: 211 mg/dL — ABNORMAL HIGH (ref 70–99)
Potassium: 3.4 mmol/L — ABNORMAL LOW (ref 3.5–5.1)
Sodium: 138 mmol/L (ref 135–145)
Total Bilirubin: 1.1 mg/dL (ref 0.3–1.2)
Total Protein: 6 g/dL — ABNORMAL LOW (ref 6.5–8.1)

## 2019-06-08 LAB — IRON AND TIBC
Iron: 53 ug/dL (ref 28–170)
Saturation Ratios: 20 % (ref 10.4–31.8)
TIBC: 266 ug/dL (ref 250–450)
UIBC: 213 ug/dL

## 2019-06-08 LAB — POCT HEMOGLOBIN-HEMACUE: Hemoglobin: 10.2 g/dL — ABNORMAL LOW (ref 12.0–15.0)

## 2019-06-08 LAB — FERRITIN: Ferritin: 350 ng/mL — ABNORMAL HIGH (ref 11–307)

## 2019-06-08 MED ORDER — DARBEPOETIN ALFA 200 MCG/0.4ML IJ SOSY
PREFILLED_SYRINGE | INTRAMUSCULAR | Status: AC
  Administered 2019-06-08: 200 ug via SUBCUTANEOUS
  Filled 2019-06-08: qty 0.4

## 2019-06-08 MED ORDER — DARBEPOETIN ALFA 200 MCG/0.4ML IJ SOSY: 200.0000 ug | PREFILLED_SYRINGE | INTRAMUSCULAR | Status: DC

## 2019-06-09 LAB — PTH, INTACT AND CALCIUM
Calcium, Total (PTH): 8.8 mg/dL (ref 8.7–10.3)
PTH: 93 pg/mL — ABNORMAL HIGH (ref 15–65)

## 2019-07-06 ENCOUNTER — Encounter (HOSPITAL_COMMUNITY): Payer: Medicare Other | Admitting: Internal Medicine

## 2019-07-06 ENCOUNTER — Encounter (HOSPITAL_COMMUNITY): Payer: Medicare Other

## 2019-07-13 ENCOUNTER — Encounter (HOSPITAL_COMMUNITY): Payer: Medicare Other

## 2019-07-31 ENCOUNTER — Telehealth (HOSPITAL_COMMUNITY): Payer: Self-pay

## 2019-07-31 NOTE — Telephone Encounter (Signed)

## 2019-08-01 ENCOUNTER — Other Ambulatory Visit: Payer: Self-pay

## 2019-08-01 ENCOUNTER — Ambulatory Visit (HOSPITAL_COMMUNITY)
Admission: RE | Admit: 2019-08-01 | Discharge: 2019-08-01 | Disposition: A | Discharge status: Home / Self Care | Payer: Medicare Other | Source: Ambulatory Visit | Attending: Internal Medicine | Admitting: Internal Medicine

## 2019-08-01 ENCOUNTER — Ambulatory Visit (HOSPITAL_COMMUNITY)
Admission: RE | Admit: 2019-08-01 | Discharge: 2019-08-01 | Disposition: A | Discharge status: Home / Self Care | Payer: Medicare Other | Source: Ambulatory Visit | Attending: Nephrology | Admitting: Nephrology

## 2019-08-01 ENCOUNTER — Encounter (HOSPITAL_COMMUNITY): Payer: Self-pay | Admitting: Internal Medicine

## 2019-08-01 VITALS — BP 131/78 | HR 87 | Wt 123.8 lb

## 2019-08-01 VITALS — BP 168/110 | Temp 95.7°F | Resp 18

## 2019-08-01 DIAGNOSIS — E1122 Type 2 diabetes mellitus with diabetic chronic kidney disease: Secondary | ICD-10-CM | POA: Insufficient documentation

## 2019-08-01 DIAGNOSIS — E669 Obesity, unspecified: Secondary | ICD-10-CM | POA: Diagnosis not present

## 2019-08-01 DIAGNOSIS — I5042 Chronic combined systolic (congestive) and diastolic (congestive) heart failure: Secondary | ICD-10-CM | POA: Diagnosis present

## 2019-08-01 DIAGNOSIS — M549 Dorsalgia, unspecified: Secondary | ICD-10-CM | POA: Insufficient documentation

## 2019-08-01 DIAGNOSIS — I5022 Chronic systolic (congestive) heart failure: Secondary | ICD-10-CM | POA: Diagnosis not present

## 2019-08-01 DIAGNOSIS — Z8614 Personal history of Methicillin resistant Staphylococcus aureus infection: Secondary | ICD-10-CM | POA: Insufficient documentation

## 2019-08-01 DIAGNOSIS — M1990 Unspecified osteoarthritis, unspecified site: Secondary | ICD-10-CM | POA: Diagnosis not present

## 2019-08-01 DIAGNOSIS — Z8249 Family history of ischemic heart disease and other diseases of the circulatory system: Secondary | ICD-10-CM | POA: Diagnosis not present

## 2019-08-01 DIAGNOSIS — E785 Hyperlipidemia, unspecified: Secondary | ICD-10-CM | POA: Insufficient documentation

## 2019-08-01 DIAGNOSIS — Z794 Long term (current) use of insulin: Secondary | ICD-10-CM | POA: Diagnosis not present

## 2019-08-01 DIAGNOSIS — J45909 Unspecified asthma, uncomplicated: Secondary | ICD-10-CM | POA: Diagnosis not present

## 2019-08-01 DIAGNOSIS — K219 Gastro-esophageal reflux disease without esophagitis: Secondary | ICD-10-CM | POA: Insufficient documentation

## 2019-08-01 DIAGNOSIS — I130 Hypertensive heart and chronic kidney disease with heart failure and stage 1 through stage 4 chronic kidney disease, or unspecified chronic kidney disease: Secondary | ICD-10-CM | POA: Insufficient documentation

## 2019-08-01 DIAGNOSIS — I272 Pulmonary hypertension, unspecified: Secondary | ICD-10-CM | POA: Diagnosis not present

## 2019-08-01 DIAGNOSIS — D631 Anemia in chronic kidney disease: Secondary | ICD-10-CM

## 2019-08-01 DIAGNOSIS — Z7982 Long term (current) use of aspirin: Secondary | ICD-10-CM | POA: Diagnosis not present

## 2019-08-01 DIAGNOSIS — I10 Essential (primary) hypertension: Secondary | ICD-10-CM | POA: Diagnosis not present

## 2019-08-01 DIAGNOSIS — Z79899 Other long term (current) drug therapy: Secondary | ICD-10-CM | POA: Insufficient documentation

## 2019-08-01 DIAGNOSIS — I2721 Secondary pulmonary arterial hypertension: Secondary | ICD-10-CM | POA: Diagnosis not present

## 2019-08-01 DIAGNOSIS — N183 Chronic kidney disease, stage 3 unspecified: Secondary | ICD-10-CM

## 2019-08-01 DIAGNOSIS — N189 Chronic kidney disease, unspecified: Secondary | ICD-10-CM

## 2019-08-01 DIAGNOSIS — N184 Chronic kidney disease, stage 4 (severe): Secondary | ICD-10-CM | POA: Insufficient documentation

## 2019-08-01 LAB — POCT HEMOGLOBIN-HEMACUE: Hemoglobin: 9.8 g/dL — ABNORMAL LOW (ref 12.0–15.0)

## 2019-08-01 LAB — IRON AND TIBC
Iron: 91 ug/dL (ref 28–170)
Saturation Ratios: 30 % (ref 10.4–31.8)
TIBC: 305 ug/dL (ref 250–450)
UIBC: 214 ug/dL

## 2019-08-01 LAB — FERRITIN: Ferritin: 495 ng/mL — ABNORMAL HIGH (ref 11–307)

## 2019-08-01 MED ORDER — DARBEPOETIN ALFA 200 MCG/0.4ML IJ SOSY
PREFILLED_SYRINGE | INTRAMUSCULAR | Status: AC
  Administered 2019-08-01: 200 ug via SUBCUTANEOUS
  Filled 2019-08-01: qty 0.4

## 2019-08-01 MED ORDER — DARBEPOETIN ALFA 200 MCG/0.4ML IJ SOSY: 200.0000 ug | PREFILLED_SYRINGE | INTRAMUSCULAR | Status: DC

## 2019-08-01 NOTE — Patient Instructions (Signed)
Your provider has recommended that you have a home sleep study.  [employer] is the company that does these test.  They will contact you by phone and must speak with you before they can ship the equipment.  Once they have spoken with you they will send the equipment right to your home with instructions on how to set it up.  Once you have completed the test you just dispose of the equipment, the information is automatically uploaded to [place] via blue-tooth technology.  IF you have any questions or issues with the equipment please call the company directly at [phone].  If your test is positive for sleep apnea and you need a home CPAP machine you will be contacted by Dr Theodosia Blender office [facility]) to set this up.   Your physician recommends that you schedule a follow-up appointment in: 6 months. You will get a call to schedule this appointment.    Please call office at [phone] option 2 if you have any questions or concerns.    At the [facility], you and your health needs are our priority. As part of our continuing mission to provide you with exceptional heart care, we have created designated Provider Care Teams. These Care Teams include your primary Cardiologist (physician) and Advanced Practice Providers (APPs- Physician Assistants and Nurse Practitioners) who all work together to provide you with the care you need, when you need it.   You may see any of the following providers on your designated Care Team at your next follow up: [name] Dr Glori Bickers . Dr Loralie Champagne . Darrick Grinder, NP . Lyda Jester, PA . Audry Riles, PharmD   Please be sure to bring in all your medications bottles to every appointment.

## 2019-08-01 NOTE — Progress Notes (Addendum)
Patient Name: Belinda [name]         DOB:  2050/08/03      Height: 5'    Weight:123 lb  Office Name: Heart and Vascular         Referring Provider: Glori Bickers MD  Today's Date: 08/01/19  Date:   STOP BANG RISK ASSESSMENT S (snore) Have you been told that you snore?     YES   T (tired) Are you often tired, fatigued, or sleepy during the day?   YES  O (obstruction) Do you stop breathing, choke, or gasp during sleep? NO   P (pressure) Do you have or are you being treated for high blood pressure? YES   B (BMI) Is your body index greater than 35 kg/m? NO    A (age) Are you 69 years old or older? YES   N (neck) Do you have a neck circumference greater than 16 inches?   NA   G (gender) Are you a female? YES   TOTAL STOP/BANG "YES" ANSWERS 5                                                                       For Office Use Only              Procedure Order Form    YES to 3+ Stop Bang questions OR two clinical symptoms - patient qualifies for [employer] (CPT 95800)     Submit: This Form + Patient Face Sheet + Clinical Note via CloudPAT or Fax: [phone]         Clinical Notes: Will consult Sleep Specialist and refer for management of therapy due to patient increased risk of Sleep Apnea. Ordering a sleep study due to the following two clinical symptoms: Excessive daytime sleepiness G47.10 /  Loud snoring R06.83 / Depression F32.9 /G47.8 / Impotence    I understand that I am proceeding with a home sleep apnea test as ordered by my treating physician. I understand that untreated sleep apnea is a serious cardiovascular risk factor and it is my responsibility to perform the test and seek management for sleep apnea. I will be contacted with the results and be managed for sleep apnea by a local sleep physician. I will be receiving equipment and further instructions from [facility]. I shall promptly ship back the equipment via the included mailing label. I understand my insurance will  be billed for the test and as the patient I am responsible for any insurance related out-of-pocket costs incurred. I have been provided with written instructions and can call for additional video or telephonic instruction, with 24-hour availability of qualified personnel to answer any questions: Patient Help Desk [phone].  Patient Signature ______________________________________________________   Date______________________ Patient Telemedicine Verbal Consent

## 2019-08-01 NOTE — Addendum Note (Signed)
Encounter addended by: Valeda Malm, RN on: 08/01/2019 12:47 PM  Actions taken: Order list changed, Diagnosis association updated, Clinical Note Signed

## 2019-08-01 NOTE — Progress Notes (Signed)
Advanced Heart Failure Clinic Note   PCP: Reynold Bowen, MD PCP-Cardiologist: Pixie Casino, MD  Nephrologist: Dr Hollie Salk  HPI: Belinda [name] is a 69 y.o. 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 AHF team consulted. Diuresed 40 lbs with lasix drip and metolazone. Myeloma panel and PYP scan negative. Myoview as below. No LHC with elevated creatinine. She had AKI with creatinine peak 3.1. She was also treated for a UTI. HF meds adjusted as able, limited by CKD. DC weight: 161 lbs  RHC 12/19 RA = 4 RV = 57/8 PA = 57/26 (40) PCW = 15 Fick cardiac output/index = 5.0/3.2 Thermo CO/CI = 6.22/3.9 PVR = 5.0 WU Ao sat = 96% PA sat = 69%, 71%  She presents today for routine follow up. At last visit I ordered PFTs which showed significant restriction and moderately decreased DLCO. Says breathing is much better. Wheezing resolved. Still very tired. Edema much improved. Can walk around the house ok but limited by back pain from disk. If she goes out uses a WC. No CP. Creatinine increased in 12/20 3.7-> 4.5. Follows with Dr. Hollie Salk.    Studies:  PFTS 7/20  FEV1 1.28 (77%) FVC 1.58 (74%)  DLCO 53%   Echo 2/20: EF 25-30% mild to moderate MR RVSP 59. RV ok.   Echo 11/19 EF 25-30% mild MR  Echo (02/07/18) EF 45-50% with mild RV HK. Moderate TR. Small to moderate pericardial effusion echo texture suggestive of possible amyloid.    ABD [place] 04/21/18 with ? Elevated R heart pressures with distended IVC and hepatic veins. Small amount of perihepatic ascites and R medical renal disease.    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  Myeloma Panel negative.   Myoview scan reviewed by Dr Haroldine Laws and notable for mild to moderate anterior ischemia - cannot exclude breast attenuation. Given lack of CP  and creatinine ~3 will treat medically.  FH: CAD in mother, CHF in father  SH: No ETOH, tobacco, or drug use   Review of systems complete and found to be negative unless listed in HPI.    Past Medical History:  Diagnosis Date  . Acute CHF (congestive heart failure) ([place]) 02/02/2018  . Anemia    "get monthly injections to boost my HgB" (02/02/2018)  . Anxiety   . Asthma   . Back pain    intractable low back  . Chronic neck pain   . CKD (chronic kidney disease), stage III   . Dysrhythmia    palpitations or heart racing periodic. has had for years- Dr Mare Ferrari follows.  [name] GERD (gastroesophageal reflux disease)   . History of blood transfusion 2016   After knee replacemnet  . Hx MRSA infection   . Hyperlipidemia   . Hypertension   . Migraines    "~ 2/month" (02/02/2018)  . Osteoarthritis    "knees, neck" (02/02/2018)  . Palpitations   . Pneumonia    "twice in 2018" (02/02/2018)  . Seasonal allergies   . Type II diabetes mellitus (HCC)    insulin dependent - fasting 140-200     Current Outpatient Medications  Medication Sig Dispense Refill  . acetaminophen (TYLENOL) 650 MG CR tablet Take 650 mg by mouth every 8 (eight) hours as needed for pain (pain).     . Alpha-D-Galactosidase (BEANO PO) Take  1-2 tablets by mouth daily as needed (gas).    . Ascorbic Acid (VITAMIN C) 1000 MG tablet Take 1,000 mg by mouth at bedtime.    [name] aspirin 81 MG tablet Take 81 mg by mouth daily.    . Azelastine HCl 0.15 % SOLN Place 1 spray into both nostrils 2 (two) times daily as needed (allergies).     . budesonide-formoterol (SYMBICORT) 160-4.5 MCG/ACT inhaler Inhale 2 puffs into the lungs 2 (two) times daily as needed (shortness of breath).     . carvedilol (COREG) 12.5 MG tablet Take 12.5 mg by mouth 2 (two) times daily.    . Cholecalciferol (VITAMIN D-3) 5000 UNITS TABS Take 5,000 Units by mouth daily.    . diclofenac sodium (VOLTAREN) 1 % GEL Apply 1 application topically as needed (pain).    [name]  ezetimibe-simvastatin (VYTORIN) 10-40 MG per tablet Take 1 tablet by mouth at bedtime.     . ferrous gluconate (FERGON) 240 (27 FE) MG tablet Take 240 mg by mouth daily.     . fexofenadine (ALLEGRA) 180 MG tablet Take 180 mg by mouth daily.    [name] guaiFENesin (MUCINEX) 600 MG 12 hr tablet Take 600 mg by mouth daily.    . hydrALAZINE (APRESOLINE) 50 MG tablet TAKE 1 TABLET BY MOUTH THREE TIMES A DAY 270 tablet 1  . insulin lispro (HUMALOG) 100 UNIT/ML injection Inject 2-10 Units into the skin 3 (three) times daily before meals.    . isosorbide mononitrate (IMDUR) 30 MG 24 hr tablet Take 0.5 tablets (15 mg total) by mouth daily. 30 tablet 0  . LANTUS SOLOSTAR 100 UNIT/ML Solostar Pen Inject 12 Units into the skin at bedtime.   3  . metolazone (ZAROXOLYN) 5 MG tablet Take 1 tablet (5 mg total) by mouth every other day. And as needed for weight gain/swelling 15 tablet 5  . Multiple Vitamins-Minerals (ALIVE WOMENS ENERGY PO) Take 1 tablet by mouth daily.    . nitroGLYCERIN (NITROSTAT) 0.4 MG SL tablet Place 0.4 mg under the tongue every 5 (five) minutes as needed for chest pain.   0  . potassium chloride (K-DUR) 10 MEQ tablet Take 10-20 mEq by mouth See admin instructions. Take 20 meq in the morning an 10 meq at night    . simethicone (GAS-X) 80 MG chewable tablet Chew 80-160 mg by mouth daily as needed for flatulence.    . torsemide (DEMADEX) 20 MG tablet TAKE 3 TABLETS BY MOUTH EVERY MORNING AND TAKE 2 TABLETS EVERY EVENING 150 tablet 2   No current facility-administered medications for this encounter.    Allergies  Allergen Reactions  . Entresto [Sacubitril-Valsartan] Itching  . Omnicef [Cefdinir] Other (See Comments)    GI Upset  . Aspirin Other (See Comments)    GI issues, Can tolerate low aspirin  . Augmentin [Amoxicillin-Pot Clavulanate] Nausea And Vomiting and Other (See Comments)    GI issues DID THE REACTION INVOLVE: Swelling of the face/tongue/throat, SOB, or low BP? No Sudden or  severe rash/hives, skin peeling, or the inside of the mouth or nose? No Did it require medical treatment? No When did it last happen?long time  If all above answers are "NO", may proceed with cephalosporin use.   . Erythromycin Nausea And Vomiting and Other (See Comments)    Gi issues  . Gabapentin Swelling  . Nickel Itching and Rash    Breakouts       Social History   Socioeconomic History  . Marital status: Married  Spouse name: Not on file  . Number of children: 1  . Years of education: Not on file  . Highest education level: Not on file  Occupational History  . Not on file  Tobacco Use  . Smoking status: Never Smoker  . Smokeless tobacco: Never Used  Substance and Sexual Activity  . Alcohol use: Never  . Drug use: Never  . Sexual activity: Not Currently  Other Topics Concern  . Not on file  Social History Narrative  . Not on file   Social Determinants of Health   Financial Resource Strain:   . Difficulty of Paying Living Expenses: Not on file  Food Insecurity:   . Worried About Charity fundraiser in the Last Year: Not on file  . Ran Out of Food in the Last Year: Not on file  Transportation Needs:   . Lack of Transportation (Medical): Not on file  . Lack of Transportation (Non-Medical): Not on file  Physical Activity:   . Days of Exercise per Week: Not on file  . Minutes of Exercise per Session: Not on file  Stress:   . Feeling of Stress : Not on file  Social Connections:   . Frequency of Communication with Friends and Family: Not on file  . Frequency of Social Gatherings with Friends and Family: Not on file  . Attends Religious Services: Not on file  . Active Member of Clubs or Organizations: Not on file  . Attends Archivist Meetings: Not on file  . Marital Status: Not on file  Intimate Partner Violence:   . Fear of Current or Ex-Partner: Not on file  . Emotionally Abused: Not on file  . Physically Abused: Not on file  . Sexually  Abused: Not on file      Family History  Problem Relation Age of Onset  . Heart disease Mother   . Heart failure Father   . Colon cancer Neg Hx   . Stomach cancer Neg Hx   . Esophageal cancer Neg Hx    Vitals:   08/01/19 1126  BP: 131/78  Pulse: 87  SpO2: 99%  Weight: 56.2 kg (123 lb 12.8 oz)   Wt Readings from Last 3 Encounters:  08/01/19 56.2 kg (123 lb 12.8 oz)  01/29/19 56.7 kg (125 lb)  12/18/18 57.9 kg (127 lb 9.6 oz)    PHYSICAL EXAM: General:  Elderly. Sitting in WC No resp difficulty. Weak appearing  HEENT: normal Neck: supple JVP 8-9. Carotids 2+ bilat; no bruits. No lymphadenopathy or thryomegaly appreciated. Cor: PMI nondisplaced. Regular rate & rhythm. No rubs, gallops or murmurs. Lungs: clear Abdomen: soft, nontender, nondistended. No hepatosplenomegaly. No bruits or masses. Good bowel sounds. Extremities: no cyanosis, clubbing, rash,  Mild edema on L (side of TKA)  Neuro: alert & orientedx3, cranial nerves grossly intact. moves all 4 extremities w/o difficulty. Affect pleasant   ASSESSMENT & PLAN:  1. Chronic systolic HF with R>L symptoms - Etiology unclear. Echo suggestive of cardiac amyloid but w/u negative. Ischemia and hypertensive CM also possible.  - Myoview scan 8/19  notable for mild to moderate anterior ischemia - cannot exclude breast attenuation. Have been unable to cath with A/CKD IV - PYP scan 02/13/18 Grade 1, HCL 1.02 'Equivocal' for TTR amyloid. Myeloma panel negative.  - ECHO 11/18 EF 45-50% Grade IDD RV mildly dilated. Peak PA pressure 43 mm hg.  - ECHO 02/07/18 EF 25-30% - Echo 2/20 EF 25-30% with global HK - Remains NYHA III  -  Volume status looks good on torsemide 40 daily.  - RHC 12/19  moderate PAH and normal PCWP. Management as per below - Continue coreg 6.25 mg BID - Continue hydralazine to 50 mg TID - Continue imdur 30 mg daily - No ACE/ARB/ARNI/spiro with AKI. - she is very tenuous. Ideally needs coronary angio and ICD but  with poor functional capacity and impending HD these are not options - Creatinine worse recently. Suspect HD will be soon. Will not titrate HF meds at this point.   2. Pulmonary HTN - PFTs 7/20 reviewed with her. Not candidate for PDE-5 with Imdur use - Re-order sleep study  3. CKD IV - Baseline creatinine ~3.8 -> 4.5 Followed by Dr Hollie Salk.  4. Probable OSA - Sleep study soon   5. HTN - Blood pressure well controlled. Continue current regimen.  6.DM2 - Per PCP      Glori Bickers, MD 08/01/19

## 2019-08-03 ENCOUNTER — Telehealth (HOSPITAL_COMMUNITY): Payer: Self-pay

## 2019-08-03 NOTE — Telephone Encounter (Signed)
Order, OV note, stop bang and demographics all faxed to [employer] at [phone] via epic  

## 2019-08-06 ENCOUNTER — Telehealth (HOSPITAL_COMMUNITY): Payer: Self-pay

## 2019-08-06 NOTE — Telephone Encounter (Signed)
Received Fax from [employer] stating that they received the order and are currently processing.

## 2019-08-16 ENCOUNTER — Telehealth (HOSPITAL_COMMUNITY): Payer: Self-pay | Admitting: *Deleted

## 2019-08-16 NOTE — Telephone Encounter (Signed)
Received fax from [employer], [employer] is out of network for home sleep study.  Message sent to Dr Haroldine Laws to make him aware, order cancelled

## 2019-08-29 ENCOUNTER — Other Ambulatory Visit: Payer: Self-pay

## 2019-08-29 ENCOUNTER — Encounter (HOSPITAL_COMMUNITY)
Admission: RE | Admit: 2019-08-29 | Discharge: 2019-08-29 | Disposition: A | Discharge status: Home / Self Care | Payer: Medicare Other | Source: Ambulatory Visit | Attending: Nephrology | Admitting: Nephrology

## 2019-08-29 VITALS — BP 155/104 | HR 91 | Temp 95.5°F | Resp 18

## 2019-08-29 DIAGNOSIS — N183 Chronic kidney disease, stage 3 unspecified: Secondary | ICD-10-CM | POA: Insufficient documentation

## 2019-08-29 DIAGNOSIS — D631 Anemia in chronic kidney disease: Secondary | ICD-10-CM | POA: Insufficient documentation

## 2019-08-29 DIAGNOSIS — N185 Chronic kidney disease, stage 5: Secondary | ICD-10-CM | POA: Insufficient documentation

## 2019-08-29 LAB — RENAL FUNCTION PANEL
Albumin: 3.5 g/dL (ref 3.5–5.0)
Anion gap: 14 (ref 5–15)
BUN: 101 mg/dL — ABNORMAL HIGH (ref 8–23)
CO2: 17 mmol/L — ABNORMAL LOW (ref 22–32)
Calcium: 8.5 mg/dL — ABNORMAL LOW (ref 8.9–10.3)
Chloride: 104 mmol/L (ref 98–111)
Creatinine, Ser: 6.39 mg/dL — ABNORMAL HIGH (ref 0.44–1.00)
GFR calc Af Amer: 7 mL/min — ABNORMAL LOW (ref >60)
GFR calc non Af Amer: 6 mL/min — ABNORMAL LOW (ref >60)
Glucose, Bld: 324 mg/dL — ABNORMAL HIGH (ref 70–99)
Phosphorus: 6.4 mg/dL — ABNORMAL HIGH (ref 2.5–4.6)
Potassium: 4.3 mmol/L (ref 3.5–5.1)
Sodium: 135 mmol/L (ref 135–145)

## 2019-08-29 LAB — FERRITIN: Ferritin: 414 ng/mL — ABNORMAL HIGH (ref 11–307)

## 2019-08-29 LAB — IRON AND TIBC
Iron: 68 ug/dL (ref 28–170)
Saturation Ratios: 25 % (ref 10.4–31.8)
TIBC: 272 ug/dL (ref 250–450)
UIBC: 204 ug/dL

## 2019-08-29 LAB — POCT HEMOGLOBIN-HEMACUE: Hemoglobin: 10.4 g/dL — ABNORMAL LOW (ref 12.0–15.0)

## 2019-08-29 MED ORDER — DARBEPOETIN ALFA 200 MCG/0.4ML IJ SOSY
200.0000 ug | PREFILLED_SYRINGE | INTRAMUSCULAR | Status: DC
  Administered 2019-08-29: 200 ug via SUBCUTANEOUS

## 2019-08-29 MED ORDER — DARBEPOETIN ALFA 200 MCG/0.4ML IJ SOSY
PREFILLED_SYRINGE | INTRAMUSCULAR | Status: AC
  Filled 2019-08-29: qty 0.4

## 2019-08-30 LAB — PTH, INTACT AND CALCIUM
Calcium, Total (PTH): 8.8 mg/dL (ref 8.7–10.3)
PTH: 156 pg/mL — ABNORMAL HIGH (ref 15–65)

## 2019-09-03 ENCOUNTER — Other Ambulatory Visit (HOSPITAL_COMMUNITY): Payer: Self-pay | Admitting: Cardiology

## 2019-09-04 ENCOUNTER — Other Ambulatory Visit (HOSPITAL_COMMUNITY): Payer: Self-pay | Admitting: Internal Medicine

## 2019-09-26 ENCOUNTER — Encounter (HOSPITAL_COMMUNITY): Payer: Medicare Other

## 2019-10-12 ENCOUNTER — Ambulatory Visit (HOSPITAL_COMMUNITY)
Admission: RE | Admit: 2019-10-12 | Discharge: 2019-10-12 | Disposition: A | Discharge status: Home / Self Care | Payer: Medicare Other | Source: Ambulatory Visit | Attending: Nephrology | Admitting: Nephrology

## 2019-10-12 ENCOUNTER — Other Ambulatory Visit: Payer: Self-pay

## 2019-10-12 DIAGNOSIS — N183 Chronic kidney disease, stage 3 unspecified: Secondary | ICD-10-CM | POA: Insufficient documentation

## 2019-10-12 DIAGNOSIS — D631 Anemia in chronic kidney disease: Secondary | ICD-10-CM | POA: Insufficient documentation

## 2019-10-12 LAB — RENAL FUNCTION PANEL
Albumin: 3.5 g/dL (ref 3.5–5.0)
Anion gap: 20 — ABNORMAL HIGH (ref 5–15)
BUN: 175 mg/dL — ABNORMAL HIGH (ref 8–23)
CO2: 18 mmol/L — ABNORMAL LOW (ref 22–32)
Calcium: 7 mg/dL — ABNORMAL LOW (ref 8.9–10.3)
Chloride: 99 mmol/L (ref 98–111)
Creatinine, Ser: 11.34 mg/dL — ABNORMAL HIGH (ref 0.44–1.00)
GFR calc Af Amer: 4 mL/min — ABNORMAL LOW (ref >60)
GFR calc non Af Amer: 3 mL/min — ABNORMAL LOW (ref >60)
Glucose, Bld: 170 mg/dL — ABNORMAL HIGH (ref 70–99)
Phosphorus: 11.3 mg/dL — ABNORMAL HIGH (ref 2.5–4.6)
Potassium: 3.9 mmol/L (ref 3.5–5.1)
Sodium: 137 mmol/L (ref 135–145)

## 2019-10-12 LAB — IRON AND TIBC
Iron: 155 ug/dL (ref 28–170)
Saturation Ratios: 53 % — ABNORMAL HIGH (ref 10.4–31.8)
TIBC: 294 ug/dL (ref 250–450)
UIBC: 139 ug/dL

## 2019-10-12 LAB — POCT HEMOGLOBIN-HEMACUE: Hemoglobin: 10.2 g/dL — ABNORMAL LOW (ref 12.0–15.0)

## 2019-10-12 LAB — FERRITIN: Ferritin: 566 ng/mL — ABNORMAL HIGH (ref 11–307)

## 2019-10-12 MED ORDER — DARBEPOETIN ALFA 200 MCG/0.4ML IJ SOSY
PREFILLED_SYRINGE | INTRAMUSCULAR | Status: AC
  Filled 2019-10-12: qty 0.4

## 2019-10-12 MED ORDER — DARBEPOETIN ALFA 200 MCG/0.4ML IJ SOSY
200.0000 ug | PREFILLED_SYRINGE | INTRAMUSCULAR | Status: DC
  Administered 2019-10-12: 200 ug via SUBCUTANEOUS

## 2019-10-13 LAB — PTH, INTACT AND CALCIUM
Calcium, Total (PTH): 7 mg/dL — ABNORMAL LOW (ref 8.7–10.3)
PTH: 282 pg/mL — ABNORMAL HIGH (ref 15–65)

## 2019-11-09 ENCOUNTER — Encounter (HOSPITAL_COMMUNITY): Payer: Medicare Other

## 2019-12-05 ENCOUNTER — Other Ambulatory Visit: Payer: Self-pay | Admitting: *Deleted

## 2019-12-05 DIAGNOSIS — N186 End stage renal disease: Secondary | ICD-10-CM

## 2019-12-07 ENCOUNTER — Other Ambulatory Visit: Payer: Self-pay

## 2019-12-07 ENCOUNTER — Ambulatory Visit (HOSPITAL_COMMUNITY)
Admission: RE | Admit: 2019-12-07 | Discharge: 2019-12-07 | Disposition: A | Discharge status: Home / Self Care | Payer: Medicare Other | Source: Ambulatory Visit | Attending: Surgery | Admitting: Surgery

## 2019-12-07 ENCOUNTER — Encounter (HOSPITAL_COMMUNITY): Payer: Medicare Other

## 2019-12-07 ENCOUNTER — Ambulatory Visit (INDEPENDENT_AMBULATORY_CARE_PROVIDER_SITE_OTHER)
Admission: RE | Admit: 2019-12-07 | Discharge: 2019-12-07 | Disposition: A | Discharge status: Home / Self Care | Payer: Medicare Other | Source: Ambulatory Visit | Attending: Vascular Surgery | Admitting: Vascular Surgery

## 2019-12-07 ENCOUNTER — Encounter: Payer: Self-pay | Admitting: Vascular Surgery

## 2019-12-07 ENCOUNTER — Ambulatory Visit: Payer: Medicare Other | Admitting: Vascular Surgery

## 2019-12-07 VITALS — BP 151/83 | HR 92 | Temp 97.3°F | Resp 20 | Ht 60.0 in | Wt 123.0 lb

## 2019-12-07 DIAGNOSIS — N186 End stage renal disease: Secondary | ICD-10-CM | POA: Insufficient documentation

## 2019-12-07 NOTE — Progress Notes (Signed)
Patient ID: ANNASTASIA HASKINS, female   DOB: 1950-08-21, 69 y.o.   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  She has never had chest, breast or upper extremity surgery.  She does not take blood thinners.  Currently dialyzes Tuesdays Thursdays Saturdays.  She does have numbness of her bilateral upper extremities due to cervical disc disease.  Past Medical History:  Diagnosis Date  . Acute CHF (congestive heart failure) ([place]) 02/02/2018  . Anemia    "get monthly injections to boost my HgB" (02/02/2018)  . Anxiety   . Asthma   . Back pain    intractable low back  . Chronic neck pain   . CKD (chronic kidney disease), stage III   . Dysrhythmia    palpitations or heart racing periodic. has had for years- Dr Mare Ferrari follows.  [name] GERD (gastroesophageal reflux disease)   . History of blood transfusion 2016   After knee replacemnet  . Hx MRSA infection   . Hyperlipidemia   . Hypertension   . Migraines    "~ 2/month" (02/02/2018)  . Osteoarthritis    "knees, neck" (02/02/2018)  . Palpitations   . Pneumonia    "twice in 2018" (02/02/2018)  . Seasonal allergies   . Type II diabetes mellitus (HCC)    insulin dependent - fasting 140-200    Family History  Problem Relation Age of Onset  . Heart disease Mother   . Heart failure Father   . Colon cancer Neg Hx   . Stomach cancer Neg Hx   . Esophageal cancer Neg Hx    Past Surgical History:  Procedure Laterality Date  . ABDOMINAL HYSTERECTOMY  03/2001   Abdominal supracervical hysterectomy, left salpingo-oophorectomy  . ANTERIOR CERVICAL DECOMP/DISCECTOMY FUSION N/A 01/01/2014   Procedure: CERVICAL THREE-FOUR,CERVICAL FIVE-SIX,CERVICAL SIX-SEVEN ANTERIOR CERVICAL DECOMPRESSION/DISCECTOMY/FUSION;  Surgeon: Kristeen Miss, MD;  Location: [facility];  Service: Neurosurgery;  Laterality: N/A;  left-side approach  . BACK SURGERY    . CATARACT EXTRACTION W/ INTRAOCULAR LENS  IMPLANT, BILATERAL Bilateral 2012  . CESAREAN SECTION    . DILATION AND CURETTAGE OF UTERUS  X 2  . HAND SURGERY Left 1970s   laceration repair 4th and 5th digits  . JOINT REPLACEMENT    . KNEE ARTHROSCOPY Left    torn mensicus  . NASAL SINUS SURGERY    . RIGHT HEART CATH N/A 06/23/2018   Procedure: RIGHT HEART CATH;  Surgeon: Jolaine Artist, MD;  Location: [facility];  Service: Cardiovascular;  Laterality: N/A;  . TOTAL KNEE ARTHROPLASTY Left 02/12/2015   Procedure: TOTAL KNEE ARTHROPLASTY;  Surgeon: [name], MD;  Location: [facility];  Service: Orthopedics;  Laterality: Left;    Short Social History:  Social History   Tobacco Use  . Smoking status: Never Smoker  . Smokeless tobacco: Never Used  Substance Use Topics  . Alcohol use: Never    Allergies  Allergen Reactions  . Entresto [Sacubitril-Valsartan] Itching  . Omnicef [Cefdinir] Other (See Comments)    GI Upset  . Aspirin Other (See Comments)    GI issues, Can tolerate low aspirin  . Augmentin [Amoxicillin-Pot Clavulanate] Nausea And Vomiting and Other (See Comments)    GI issues DID THE REACTION INVOLVE: Swelling of the face/tongue/throat, SOB, or low  BP? No Sudden or severe rash/hives, skin peeling, or the inside of the mouth or nose? No Did it require medical treatment? No When did it last happen?long time  If all above answers are "NO", may proceed with cephalosporin use.   . Erythromycin Nausea And Vomiting and Other (See Comments)    Gi issues  . Gabapentin Swelling  . Nickel Itching and Rash    Breakouts     Current Outpatient Medications  Medication Sig Dispense Refill  . acetaminophen (TYLENOL) 650 MG CR tablet Take 650 mg by mouth every 8 (eight) hours as needed for pain (pain).     [name] albuterol (PROVENTIL) (2.5 MG/3ML) 0.083% nebulizer solution Inhale  into the lungs.    [name] albuterol (PROVENTIL) (2.5 MG/3ML) 0.083% nebulizer solution SMARTSIG:1 Vial(s) Via Nebulizer 4 Times Daily PRN    . Alpha-D-Galactosidase (BEANO PO) Take 1-2 tablets by mouth daily as needed (gas).    . Ascorbic Acid (VITAMIN C) 1000 MG tablet Take 1,000 mg by mouth at bedtime.    [name] aspirin 81 MG tablet Take 81 mg by mouth daily.    . Azelastine HCl 0.15 % SOLN Place 1 spray into both nostrils 2 (two) times daily as needed (allergies).     . budesonide-formoterol (SYMBICORT) 160-4.5 MCG/ACT inhaler Inhale 2 puffs into the lungs 2 (two) times daily as needed (shortness of breath).     . carvedilol (COREG) 12.5 MG tablet Take 12.5 mg by mouth 2 (two) times daily.    . Cholecalciferol (VITAMIN D-3) 5000 UNITS TABS Take 5,000 Units by mouth daily.    . diclofenac sodium (VOLTAREN) 1 % GEL Apply 1 application topically as needed (pain).    [name] ezetimibe-simvastatin (VYTORIN) 10-40 MG per tablet Take 1 tablet by mouth at bedtime.     . ferric citrate (AURYXIA) 1 GM 210 MG(Fe) tablet Take by mouth.    . ferrous gluconate (FERGON) 240 (27 FE) MG tablet Take 240 mg by mouth daily.     . fexofenadine (ALLEGRA) 180 MG tablet Take 180 mg by mouth daily.    [name] guaiFENesin (MUCINEX) 600 MG 12 hr tablet Take 600 mg by mouth daily.    . hydrALAZINE (APRESOLINE) 50 MG tablet TAKE 1 TABLET BY MOUTH THREE TIMES A DAY 270 tablet 1  . insulin lispro (HUMALOG) 100 UNIT/ML injection Inject 2-10 Units into the skin 3 (three) times daily before meals.    . isosorbide mononitrate (IMDUR) 30 MG 24 hr tablet Take 0.5 tablets (15 mg total) by mouth daily. 30 tablet 0  . LANTUS SOLOSTAR 100 UNIT/ML Solostar Pen Inject 12 Units into the skin at bedtime.   3  . metolazone (ZAROXOLYN) 5 MG tablet Take 1 tablet (5 mg total) by mouth every other day. And as needed for weight gain/swelling 15 tablet 5  . Multiple Vitamins-Minerals (ALIVE WOMENS ENERGY PO) Take 1 tablet by mouth daily.    . multivitamin  (RENA-VIT) TABS tablet Take by mouth.    . nitroGLYCERIN (NITROSTAT) 0.4 MG SL tablet Place 0.4 mg under the tongue every 5 (five) minutes as needed for chest pain.   0  . potassium chloride (K-DUR) 10 MEQ tablet Take 10-20 mEq by mouth See admin instructions. Take 20 meq in the morning an 10 meq at night    . promethazine (PHENERGAN) 12.5 MG tablet Take by mouth.    . simethicone (GAS-X) 80 MG chewable tablet Chew 80-160 mg by mouth daily as needed for flatulence.    [name]  torsemide (DEMADEX) 20 MG tablet TAKE 3 TABLETS BY MOUTH EVERY MORNING AND TAKE 2 TABLETS EVERY EVENING 450 tablet 1  . traMADol (ULTRAM) 50 MG tablet SMARTSIG:4 Tablet(s) By Mouth Every Night PRN     No current facility-administered medications for this visit.    Review of Systems  Constitutional:  Constitutional negative. HENT: HENT negative.  Eyes: Eyes negative.  Respiratory: Respiratory negative.  Cardiovascular: Cardiovascular negative.  GI: Gastrointestinal negative.  Musculoskeletal: Musculoskeletal negative.  Skin: Skin negative.  Neurological: Positive for numbness.  Hematologic: Hematologic/lymphatic negative.  Psychiatric: Psychiatric negative.        Objective:  Objective   Vitals:   12/07/19 1142  BP: (!) 151/83  Pulse: 92  Resp: 20  Temp: (!) 97.3 F (36.3 C)  SpO2: 98%  Weight: 123 lb (55.8 kg)  Height: 5' (1.524 m)   Body mass index is 24.02 kg/m.  Physical Exam Constitutional:      Appearance: Normal appearance.  HENT:     Nose:     Comments: Mask in place Eyes:     Pupils: Pupils are equal, round, and reactive to light.  Cardiovascular:     Pulses:          Radial pulses are 2+ on the right side and 2+ on the left side.     Comments: Bilateral brachial arteries are palpable Pulmonary:     Effort: Pulmonary effort is normal.  Abdominal:     General: Abdomen is flat.     Palpations: Abdomen is soft.  Musculoskeletal:        General: No swelling. Normal range of motion.   Skin:    General: Skin is warm and dry.     Capillary Refill: Capillary refill takes less than 2 seconds.  Neurological:     General: No focal deficit present.     Mental Status: She is alert.  Psychiatric:        Mood and Affect: Mood normal.        Behavior: Behavior normal.        Thought Content: Thought content normal.        Judgment: Judgment normal.     Data: I have independently turbid her upper extremity arterial duplexes which demonstrates brachial arteries bilaterally 0.4 cm and triphasic.  I have independently interpreted her bilateral upper extremity vein mapping.  Appears her only suitable vein for dialysis access would be her left basilic vein.     Assessment/Plan:     69 year old female with end-stage renal disease dialyzing via catheter.  She has marginal basilic vein on the left for fistula creation but is significantly left-hand dominant and would prefer access in the right.  We will evaluate the vein at the time of surgery but I discussed with her the probability of graft.  I also discussed the risk benefits alternatives.  She demonstrates good understanding we will get her scheduled for right arm graft with possible fistula placement on a nondialysis day in the near future.     Waynetta Sandy MD [facility]

## 2019-12-11 ENCOUNTER — Telehealth: Payer: Self-pay

## 2019-12-11 NOTE — Telephone Encounter (Signed)
Triage vm left from pt stating she wanted to let us know that she had CHF but noticed that it wasn't listed on MyChart. Reviewed Epic and noted Congestive Heart Failure listed. Contacted pt back- LMOM advising medical record reflects information provided. Contact office with any additional questions or concerns.

## 2019-12-12 ENCOUNTER — Other Ambulatory Visit: Payer: Self-pay

## 2020-01-07 ENCOUNTER — Other Ambulatory Visit (HOSPITAL_COMMUNITY)
Admission: RE | Admit: 2020-01-07 | Discharge: 2020-01-07 | Disposition: A | Discharge status: Home / Self Care | Payer: Medicare Other | Source: Ambulatory Visit | Attending: Vascular Surgery | Admitting: Vascular Surgery

## 2020-01-07 DIAGNOSIS — Z20822 Contact with and (suspected) exposure to covid-19: Secondary | ICD-10-CM | POA: Diagnosis not present

## 2020-01-07 DIAGNOSIS — Z01812 Encounter for preprocedural laboratory examination: Secondary | ICD-10-CM | POA: Insufficient documentation

## 2020-01-07 LAB — SARS CORONAVIRUS 2 (TAT 6-24 HRS): SARS Coronavirus 2: NEGATIVE

## 2020-01-08 NOTE — Progress Notes (Signed)
Anesthesia Chart Review:  Case: [id number] Date/Time: 01/09/20 0815   Procedures:      ARTERIOVENOUS (AV) FISTULA CREATION (Right )     INSERTION OF ARTERIOVENOUS (AV) GORE-TEX GRAFT ARM (Right )   Anesthesia type: Choice   Pre-op diagnosis: END STAGE RENAL DISEASE   Location: [facility] OR ROOM 12 / [facility]   Surgeons: Waynetta Sandy, MD      DISCUSSION: Patient is a 69 year old female scheduled for the above procedure. She recently was started on hemodialysis and is currently dialyzing via catheter.   History includes never smoker, HTN, HLD, palpitations, chronic combined systolic and diastolic CHF (new cardiomyopathy diagnosed 04/2017, presumed non-ischemic, no LHC due to CKD), DM2, CKD (progressed to ESRD 2021, HD TTS), anemia, GERD, asthma, chronic neck pain (s/pC4-5/C5-7 ACDF 01/01/14; severe C2-3 spinal canal stenosis/osteophyte 09/01/18 MRI, has numbness BUE by [facility] notes).    Last visit with Dr. Haroldine Laws 08/01/19. Six month follow-up planned. His A/P includes: "1. Chronic systolic HF with R>L symptoms - Etiology unclear. Echo suggestive of cardiac amyloid but w/u negative. Ischemia and hypertensive CM also possible.  - Myoview scan 8/19  notable for mild to moderate anterior ischemia - cannot exclude breast attenuation. Have been unable to cath with A/CKD IV - PYP scan 02/13/18 Grade 1, HCL 1.02 'Equivocal' for TTR amyloid. Myeloma panel negative.  - ECHO 11/18 EF 45-50% Grade IDD RV mildly dilated. Peak PA pressure 43 mm hg.  - ECHO 02/07/18 EF 25-30% - Echo 2/20 EF 25-30% with global HK - Remains NYHA III  - Volume status looks good on torsemide 40 daily.  - RHC 12/19  moderate PAH and normal PCWP. Management as per below - Continue coreg 6.25 mg BID - Continue hydralazine to 50 mg TID - Continue imdur 30 mg daily - No ACE/ARB/ARNI/spiro with AKI. - she is very tenuous. Ideally needs coronary angio and ICD but with poor functional capacity and impending HD these are not options -  Creatinine worse recently. Suspect HD will be soon. Will not titrate HF meds at this point.   2. Pulmonary HTN - PFTs 7/20 reviewed with her. Not candidate for PDE-5 with Imdur use - Re-order sleep study"  01/07/2020 presurgical COVID-19 test negative.  Discussed with anesthesiologist Oleta Mouse, MD who recommended touching base with Dr. Haroldine Laws to clarify the she would require additional cardiac testing given she now on hemodialysis (according to vascular note, as PAT RN interview is still pending). I sent a staff message earlier today, but learned that Dr. Haroldine Laws is out of the office this week. He is the primary cardiology provider at the HF clinic who sees her, so unable to get input from a covering cardiologist. I updated Dr. Jaci Lazier unable to get preoperative input then would recommend postponing case. I have notified Shanon at Dr. Claretha Cooper office, and she will follow-up with patient and ask her to follow-up with Dr. Sung Amabile for any additional  recommendations.      VS:  Wt Readings from Last 3 Encounters:  12/07/19 55.8 kg  08/01/19 56.2 kg  01/29/19 56.7 kg   BP Readings from Last 3 Encounters:  12/07/19 (!) 151/83  10/12/19 (!) 160/92  08/29/19 (!) 155/104   Pulse Readings from Last 3 Encounters:  12/07/19 92  10/12/19 81  08/29/19 91     PROVIDERS: Reynold Bowen, MD is PCP  Lyman Bishop, MD is cardiologist Glori Bickers, MD is HF cardiologist Madelon Lips, MD is nephrologist   LABS: She is for labs  on the day of surgery. As of 10/12/19, Cr 11.34, glucose 170, HGB 10.2.    PFTs 01/23/19: FVC 1.58 (74%), post 1.40 (65%). FEV1 1.28 (77%), post 1.17 (71%). DLCO unc 9.42 (53%), cor 11.87 (66%).  Restriction of excelled volume.  Patient reported extremely short of breath, unable to perform plethysmography for direct measurement of lung volumes.  Insignificant response to bronchodilator.  Diffusion mildly/moderately reduced.   IMAGES: CXR  03/19/19 FINDINGS: Moderate cardiomegaly, similar to prior. No focal airspace consolidation. No pleural effusion or pneumothorax. IMPRESSION: 1. No active cardiopulmonary disease. 2. Moderate cardiomegaly.  MRI C-spine 09/01/18 (ordered by Zonia Kief, MD): IMPRESSION: 1. Severe C2-3 spinal canal stenosis with compression of the spinal cord, new compared to the 2015 study, secondary to large posterior osteophyte. Mild hyperintense T2-weighted signal within the spinal cord. 2. Status post C3-4 and C5-7 ACDF. Mild spinal canal stenosis persists at C4-5. 3. Multilevel moderate-to-severe neural foraminal stenosis, worst at C2-3.   EKG: Last EKG seen is from 06/23/18   CV: Echo 08/23/18: IMPRESSIONS  1. The left ventricle has severely reduced systolic function, with an  ejection fraction of 25-30%. The cavity size was normal. Left ventricular  diastolic Doppler parameters are consistent with pseudonormalization.  Elevated mean left atrial pressure.  Left ventrical global hypokinesis without regional wall motion  abnormalities.  2. The right ventricle has normal systolic function. The cavity was  normal. There is no increase in right ventricular wall thickness. Right  ventricular systolic pressure is moderately elevated with an estimated  pressure of 59 mmHg.  3. Left atrial size was mildly dilated.  4. Small circumferential pericardial effusion.  5. Mild to moderate mitral valve regurgitation, jet centrally-directed.  6. The inferior vena cava was dilated in size with <50% respiratory  variability.  7. When compared to the prior study: There is little change since  07/15/2017.  (Comparison: LVEF 25-30% with diffuse hypokinesis 04/29/17; 45-50% 02/07/18; 25-30% 05/15/18)   RHC 06/23/18: Findings:  RA = 4 RV = 57/8 PA = 57/26 (40) PCW = 15 Fick cardiac output/index = 5.0/3.2 Thermo CO/CI = 6.22/3.9 PVR = 5.0 WU Ao sat = 96% PA sat = 69%, 71%  Assessment: 1.  Mild to moderate PAH with normal PCWP and normal outputs Plan/Discussion: Continue medical therapy and w/u for PAH. Volume status looks good.  Glori Bickers, MD    Nuclear stress test 02/16/18: IMPRESSION: 1. Concern for mild to moderate reversibility involving the anterior wall. Concern for mild reversibility in the inferior wall. 2. Diffuse hypokinesia throughout the left ventricle. 3. Left ventricular ejection fraction is 30%. 4. Non invasive risk stratification*: High *2012 Appropriate Use Criteria for Coronary Revascularization Focused Update: J Am Coll Cardiol. 9381;82(9):937-169. http://content.airportbarriers.com.aspx?articleid=1201161 - By 08/01/19 cardiology note: "Myoview scan reviewed by Dr Haroldine Laws and notable for mild to moderate anterior ischemia - cannot exclude breast attenuation. Given lack of CP and creatinine ~3 will treat medically."    NM PYP Cardiac Amylodisois scan with SPECT 02/13/18: IMPRESSION: - Visual and quantitative assessment (grade 1, H/CLL equal 1.0 ) are equivocal for transthyretin amyloidosis. Consider follow-up imaging in 6-12 months or as clinically indicated. - Of note: A negative or mildly positive PYP does not exclude AL amyloid. In addition, equivocal results could represent AL amyloid or early TTR amyloid    Past Medical History:  Diagnosis Date  . Acute CHF (congestive heart failure) ([place]) 02/02/2018  . Anemia    "get monthly injections to boost my HgB" (02/02/2018)  . Anxiety   .  Asthma   . Back pain    intractable low back  . Chronic neck pain   . CKD (chronic kidney disease), stage III   . Dysrhythmia    palpitations or heart racing periodic. has had for years- Dr Mare Ferrari follows.  [name] GERD (gastroesophageal reflux disease)   . History of blood transfusion 2016   After knee replacemnet  . Hx MRSA infection   . Hyperlipidemia   . Hypertension   . Migraines    "~ 2/month" (02/02/2018)  . Osteoarthritis    "knees, neck"  (02/02/2018)  . Palpitations   . Pneumonia    "twice in 2018" (02/02/2018)  . Seasonal allergies   . Type II diabetes mellitus (HCC)    insulin dependent - fasting 140-200     Past Surgical History:  Procedure Laterality Date  . ABDOMINAL HYSTERECTOMY  03/2001   Abdominal supracervical hysterectomy, left salpingo-oophorectomy  . ANTERIOR CERVICAL DECOMP/DISCECTOMY FUSION N/A 01/01/2014   Procedure: CERVICAL THREE-FOUR,CERVICAL FIVE-SIX,CERVICAL SIX-SEVEN ANTERIOR CERVICAL DECOMPRESSION/DISCECTOMY/FUSION;  Surgeon: Kristeen Miss, MD;  Location: [facility];  Service: Neurosurgery;  Laterality: N/A;  left-side approach  . BACK SURGERY    . CATARACT EXTRACTION W/ INTRAOCULAR LENS  IMPLANT, BILATERAL Bilateral 2012  . CESAREAN SECTION    . DILATION AND CURETTAGE OF UTERUS  X 2  . HAND SURGERY Left 1970s   laceration repair 4th and 5th digits  . JOINT REPLACEMENT    . KNEE ARTHROSCOPY Left    torn mensicus  . NASAL SINUS SURGERY    . RIGHT HEART CATH N/A 06/23/2018   Procedure: RIGHT HEART CATH;  Surgeon: Jolaine Artist, MD;  Location: [place];  Service: Cardiovascular;  Laterality: N/A;  . TOTAL KNEE ARTHROPLASTY Left 02/12/2015   Procedure: TOTAL KNEE ARTHROPLASTY;  Surgeon: [name], MD;  Location: [facility];  Service: Orthopedics;  Laterality: Left;    MEDICATIONS: No current facility-administered medications for this encounter.   [name] acetaminophen (TYLENOL) 650 MG CR tablet  . albuterol (PROVENTIL) (2.5 MG/3ML) 0.083% nebulizer solution  . Alpha-D-Galactosidase (BEANO PO)  . aspirin 81 MG tablet  . Azelastine HCl 0.15 % SOLN  . budesonide-formoterol (SYMBICORT) 160-4.5 MCG/ACT inhaler  . carvedilol (COREG) 12.5 MG tablet  . diclofenac sodium (VOLTAREN) 1 % GEL  . ezetimibe-simvastatin (VYTORIN) 10-40 MG per tablet  . ferric citrate (AURYXIA) 1 GM 210 MG(Fe) tablet  . fexofenadine (ALLEGRA) 180 MG tablet  . hydrALAZINE (APRESOLINE) 50 MG tablet  . insulin lispro  (HUMALOG) 100 UNIT/ML injection  . isosorbide mononitrate (IMDUR) 30 MG 24 hr tablet  . ketotifen (ALLERGY EYE DROPS) 0.025 % ophthalmic solution  . LANTUS SOLOSTAR 100 UNIT/ML Solostar Pen  . multivitamin (RENA-VIT) TABS tablet  . nitroGLYCERIN (NITROSTAT) 0.4 MG SL tablet  . simethicone (GAS-X) 80 MG chewable tablet  . guaiFENesin (MUCINEX) 600 MG 12 hr tablet  . torsemide (DEMADEX) 20 MG tablet    Myra Gianotti, PA-C Surgical Short Stay/Anesthesiology [facility] Phone [phone] [facility] Phone [phone] 01/08/2020 4:49 PM

## 2020-01-24 ENCOUNTER — Telehealth (HOSPITAL_COMMUNITY): Payer: Self-pay | Admitting: Internal Medicine

## 2020-01-24 NOTE — Telephone Encounter (Signed)
Called pt and scheduled appt for cardiac clearance 02-29-2020 @230pm  will forward to dr Haroldine Laws per pt request

## 2020-01-24 NOTE — Telephone Encounter (Signed)
   [facility] Pre-operative Risk Assessment    HEARTCARE STAFF: - Please ensure there is not already an duplicate clearance open for this procedure. - Under Visit Info/Reason for Call, type in Other and utilize the format Clearance MM/DD/YY or Clearance TBD. Do not use dashes or single digits. - If request is for dental extraction, please clarify the # of teeth to be extracted.  Request for surgical clearance:  1. What type of surgery is being performed? Fistulagram   2. When is this surgery scheduled? 02/13/20  3. What type of clearance is required (medical clearance vs. Pharmacy clearance to hold med vs. Both)? Medical   4. Are there any medications that need to be held prior to surgery and how long?Up to Cardiology  5. Practice name and name of physician performing surgery? Dr. Donzetta Matters[facility]  6. What is the office phone number? [phone]   7.   What is the office fax number? [phone] ATTN : Jeani Hawking  8.   Anesthesia type (None, local, MAC, general) ? Not sure    Belinda [name] 01/24/2020, 11:20 AM  _________________________________________________________________   (provider comments below)

## 2020-01-24 NOTE — Telephone Encounter (Signed)
Primary Cardiologist:Kenneth C Hilty, MD  Chart reviewed as part of pre-operative protocol coverage. Because of Belinda [name]'s past medical history and time since last visit, he/she will require a follow-up visit in order to better assess preoperative cardiovascular risk.  Pre-op covering staff: - Please schedule appointment and call patient to inform them. - Please contact requesting surgeon's office via preferred method (i.e, phone, fax) to inform them of need for appointment prior to surgery.  If applicable, this message will also be routed to pharmacy pool and/or primary cardiologist for input on holding anticoagulant/antiplatelet agent as requested below so that this information is available at time of patient's appointment.   Deberah Pelton, NP  01/24/2020, 11:32 AM

## 2020-01-29 ENCOUNTER — Telehealth (HOSPITAL_COMMUNITY): Payer: Self-pay | Admitting: Vascular Surgery

## 2020-01-29 NOTE — Telephone Encounter (Signed)
If patient requires general anesthesia will need cardiac cath prior to surgery.   If local/conscious sedation can proceed.

## 2020-01-29 NOTE — Telephone Encounter (Signed)
Belinda [name] from [facility] called to obtain surgical clearance ,pt is having a [place] (02/13/20), clearance is in Epic, if pt needs appt please call lynn [phone] and or if clearance is filled out per DB please fax clearance [phone].. please advise

## 2020-01-30 NOTE — Telephone Encounter (Signed)
Spoke w/Lynn, she is aware of Dr Bensimhon's recommendation and states procedure will just be with local sedation  January 29, 2020 Bensimhon, Shaune Pascal, MD to Harvie Junior, CMA     9:09 PM Note If patient requires general anesthesia will need cardiac cath prior to surgery.   If local/conscious sedation can proceed.

## 2020-01-30 NOTE — Telephone Encounter (Signed)
Spoke w/Lynn at [facility], she states pt only needs local sedation, advised ok to proceed per Dr Haroldine Laws

## 2020-02-07 ENCOUNTER — Encounter (HOSPITAL_COMMUNITY): Payer: Self-pay | Admitting: Vascular Surgery

## 2020-02-07 NOTE — Progress Notes (Signed)
Anesthesia Chart Review: SAME DAY WORK-UP   Case: [id number] Date/Time: 02/13/20 0947   Procedures:      ARTERIOVENOUS (AV) FISTULA CREATION (Right )     INSERTION OF ARTERIOVENOUS (AV) GORE-TEX GRAFT ARM (Right )   Anesthesia type: Choice   Pre-op diagnosis: END STAGE RENAL DISEASE   Location: [place] [facility] ROOM 12 / [place] [facility]   Surgeons: Waynetta Sandy, MD      DISCUSSION: Patient is a 69 year old female scheduled for the above procedure. She was started on hemodialysis ~ May 2021 and is currently dialyzing via a tunneled catheter. This surgery was initially scheduled for 01/09/20, but delayed until preoperative input could be obtained by her HF cardiologist Dr. Sung Amabile (see below).   History includes never smoker, HTN, HLD, palpitations, chronic combined systolic and diastolic CHF (new cardiomyopathy diagnosed 04/2017, presumed non-ischemic, no LHC due to CKD), pulmonary artery hypertension (mild-moderate PAH 05/2018), DM2, CKD (progressed to ESRD 10/2019, HD TTS), anemia, GERD, asthma, chronic neck pain (s/pC4-5/C5-7 ACDF 01/01/14; severe C2-3 spinal canal stenosis/osteophyte 09/01/18 MRI, has numbness BUE by [facility] notes).    Last visit with Dr. Haroldine Laws 08/01/19. She was not on hemodialysis at that time. Etiology of chronic systolic CHF unclear. Echo had suggested cardiac amyloid but work-up negative. Myoview in 01/2018 was notable for mild to moderate anterior ischemia, cannot exclude breast attenuation, but LHC deferred given advanced kidney disease. ICD not pursued given poor functional capacity and impeding hemodialysis. 05/2018 RHC showed moderate pulmonary hypertension, but not a candidate for PDE-5 with Imdur use. Sleep study reordered. Continue Coreg, hydralazine, Imdur (no ACEI/ARB with AKI). He suspected she would required HD in the near future. Six month follow-up planned (scheduled for 03/14/20).  Since July, [facility] staff and I have been in communication with Dr. Haroldine Laws regarding surgery  plans. He responded:  "If low risk of conversion to GA and patient not symptomatic from a CV standpoint with basic activities she can proceed from my standpoint as long as she understands the risks and is comfortable with them.   If significant risk of converting to GA or patient having CV symptoms with basic activities OR patient wants further reassurance prior to taking any significant operative risk then we could proceed with coronary angiography in the near future." He recommended discussion with patient, so she would weigh her options.    I called and spoke with Ms. Kocian on 01/31/20. She reported that she is actually doing better since she saw Dr. Haroldine Laws in February after getting on hemodialysis in May. She is not always able to take her Coreg/Hydralazine/Imdur due to hypotension (mainly HD days), but says she typically able to complete 4 hr 15 min sessions TTS. No chest pain. She doesn't feel SOB but admits that other than HD she is at home most of the time (can walk around house, do occasional laundry without any significant DOE). Fluid status has been stable with HD. We discussed anticipated anesthesia plan with local/MAC +/- nerve block but with small likelihood of having to convert to general anesthesia. Cased is scheduled to last ~ 1 hr 15 min, and she believes she will be able to lay at ~ 30 degree angle for the procedure (without feeling dyspneic or having significant back pain issues). I discussed option of LHC (to look for any obstructive lesions that may need intervention) prior to surgery and/or postponing surgery until she could meet with Dr. Haroldine Laws to further discuss options. She expressed that she would like to go ahead and  proceed with AVF/AVGG as scheduled but hopes to avoid general anesthesia. (Tentatively she has an appointment with general cardiology on 02/29/20, but she questioned this since she has been followed by Dr. Haroldine Laws for the past two years and already has an  appointment to see him on 03/14/20. I sent a staff message to Dr. Haroldine Laws in hopes to clarify, and told her I would let her know if I heard anything in return.)   I have updated Dr. Donzetta Matters and anesthesiologist Roberts Gaudy, MD. Preoperative COVID-19 testing is scheduled for 02/11/2020.   VS:  Wt Readings from Last 3 Encounters:  12/07/19 55.8 kg  08/01/19 56.2 kg  01/29/19 56.7 kg   BP Readings from Last 3 Encounters:  12/07/19 (!) 151/83  10/12/19 (!) 160/92  08/29/19 (!) 155/104   Pulse Readings from Last 3 Encounters:  12/07/19 92  10/12/19 81  08/29/19 91     PROVIDERS: Reynold Bowen, MD is PCP  Glori Bickers, MD is HF cardiologist. She had previously been followed by Pixie Casino, MD (2019) and Mertie Moores, MD (2018) prior to getting established with [facility] Clinic. Madelon Lips, MD is nephrologist   LABS: She is for labs on the day of surgery. As of 10/12/19, Cr 11.34, glucose 170, HGB 10.2.    PFTs 01/23/19: FVC 1.58 (74%), post 1.40 (65%). FEV1 1.28 (77%), post 1.17 (71%). DLCO unc 9.42 (53%), cor 11.87 (66%).  Restriction of excelled volume.  Patient reported extremely short of breath, unable to perform plethysmography for direct measurement of lung volumes.  Insignificant response to bronchodilator.  Diffusion mildly/moderately reduced.   IMAGES: CXR 03/19/19 FINDINGS: Moderate cardiomegaly, similar to prior. No focal airspace consolidation. No pleural effusion or pneumothorax. IMPRESSION: 1. No active cardiopulmonary disease. 2. Moderate cardiomegaly.  MRI C-spine 09/01/18 (ordered by Zonia Kief, MD): IMPRESSION: 1. Severe C2-3 spinal canal stenosis with compression of the spinal cord, new compared to the 2015 study, secondary to large posterior osteophyte. Mild hyperintense T2-weighted signal within the spinal cord. 2. Status post C3-4 and C5-7 ACDF. Mild spinal canal stenosis persists at C4-5. 3. Multilevel moderate-to-severe neural  foraminal stenosis, worst at C2-3.   EKG: Last EKG seen is from 06/23/18.   CV: Echo 08/23/18: IMPRESSIONS  1. The left ventricle has severely reduced systolic function, with an  ejection fraction of 25-30%. The cavity size was normal. Left ventricular  diastolic Doppler parameters are consistent with pseudonormalization.  Elevated mean left atrial pressure.  Left ventrical global hypokinesis without regional wall motion  abnormalities.  2. The right ventricle has normal systolic function. The cavity was  normal. There is no increase in right ventricular wall thickness. Right  ventricular systolic pressure is moderately elevated with an estimated  pressure of 59 mmHg.  3. Left atrial size was mildly dilated.  4. Small circumferential pericardial effusion.  5. Mild to moderate mitral valve regurgitation, jet centrally-directed.  6. The inferior vena cava was dilated in size with <50% respiratory  variability.  7. When compared to the prior study: There is little change since  07/15/2017.  (Comparison: LVEF 25-30% with diffuse hypokinesis 04/29/17; 45-50% 02/07/18; 25-30% 05/15/18)   RHC 06/23/18: Findings: RA = 4 RV = 57/8 PA = 57/26 (40) PCW = 15 Fick cardiac output/index = 5.0/3.2 Thermo CO/CI = 6.22/3.9 PVR = 5.0 WU Ao sat = 96% PA sat = 69%, 71% Assessment: 1. Mild to moderate PAH with normal PCWP and normal outputs Plan/Discussion: Continue medical therapy and w/u for PAH. Volume status looks  good.  Glori Bickers, MD   Nuclear stress test 02/16/18: IMPRESSION: 1. Concern for mild to moderate reversibility involving the anterior wall. Concern for mild reversibility in the inferior wall. 2. Diffuse hypokinesia throughout the left ventricle. 3. Left ventricular ejection fraction is 30%. 4. Non invasive risk stratification*: High *2012 Appropriate Use Criteria for Coronary Revascularization Focused Update: J Am Coll Cardiol.  3235;57(3):220-254. http://content.airportbarriers.com.aspx?articleid=1201161 - By 08/01/19 cardiology note: "Myoview scanreviewed by Dr Haroldine Laws and notable for mild to moderate anterior ischemia - cannot exclude breast attenuation. Given lack of CP and creatinine ~3 will treat medically."    NM PYP Cardiac Amylodisois scan with SPECT 02/13/18: IMPRESSION: - Visual and quantitative assessment (grade 1, H/CLL equal 1.0 ) are equivocal for transthyretin amyloidosis. Consider follow-up imaging in 6-12 months or as clinically indicated. - Of note: A negative or mildly positive PYP does not exclude AL amyloid. In addition, equivocal results could represent AL amyloid or early TTR amyloid.   Past Medical History:  Diagnosis Date  . Acute CHF (congestive heart failure) ([place]) 02/02/2018  . Anemia    "get monthly injections to boost my HgB" (02/02/2018)  . Anxiety   . Asthma   . Back pain    intractable low back  . Chronic neck pain   . CKD (chronic kidney disease), stage III   . Dysrhythmia    palpitations or heart racing periodic. has had for years- Dr Mare Ferrari follows.  [name] GERD (gastroesophageal reflux disease)   . History of blood transfusion 2016   After knee replacemnet  . Hx MRSA infection   . Hyperlipidemia   . Hypertension   . Migraines    "~ 2/month" (02/02/2018)  . Osteoarthritis    "knees, neck" (02/02/2018)  . Palpitations   . Pneumonia    "twice in 2018" (02/02/2018)  . Seasonal allergies   . Type II diabetes mellitus (H[facility])    insulin dependent - fasting 140-200     Past Surgical History:  Procedure Laterality Date  . ABDOMINAL HYSTERECTOMY  03/2001   Abdominal supracervical hysterectomy, left salpingo-oophorectomy  . ANTERIOR CERVICAL DECOMP/DISCECTOMY FUSION N/A 01/01/2014   Procedure: CERVICAL THREE-FOUR,CERVICAL FIVE-SIX,CERVICAL SIX-SEVEN ANTERIOR CERVICAL DECOMPRESSION/DISCECTOMY/FUSION;  Surgeon: Kristeen Miss, MD;  Location: [facility];  Service:  Neurosurgery;  Laterality: N/A;  left-side approach  . BACK SURGERY    . CATARACT EXTRACTION W/ INTRAOCULAR LENS  IMPLANT, BILATERAL Bilateral 2012  . CESAREAN SECTION    . DILATION AND CURETTAGE OF UTERUS  X 2  . HAND SURGERY Left 1970s   laceration repair 4th and 5th digits  . JOINT REPLACEMENT    . KNEE ARTHROSCOPY Left    torn mensicus  . NASAL SINUS SURGERY    . RIGHT HEART CATH N/A 06/23/2018   Procedure: RIGHT HEART CATH;  Surgeon: Jolaine Artist, MD;  Location: [facility];  Service: Cardiovascular;  Laterality: N/A;  . TOTAL KNEE ARTHROPLASTY Left 02/12/2015   Procedure: TOTAL KNEE ARTHROPLASTY;  Surgeon: [name], MD;  Location: [facility];  Service: Orthopedics;  Laterality: Left;    MEDICATIONS: No current facility-administered medications for this encounter.   [name] acetaminophen (TYLENOL) 650 MG CR tablet  . albuterol (PROVENTIL) (2.5 MG/3ML) 0.083% nebulizer solution  . Alpha-D-Galactosidase (BEANO PO)  . aspirin 81 MG tablet  . Azelastine HCl 0.15 % SOLN  . budesonide-formoterol (SYMBICORT) 160-4.5 MCG/ACT inhaler  . carvedilol (COREG) 12.5 MG tablet  . diclofenac sodium (VOLTAREN) 1 % GEL  . ezetimibe-simvastatin (VYTORIN) 10-40 MG per  tablet  . ferric citrate (AURYXIA) 1 GM 210 MG(Fe) tablet  . fexofenadine (ALLEGRA) 180 MG tablet  . hydrALAZINE (APRESOLINE) 50 MG tablet  . insulin lispro (HUMALOG) 100 UNIT/ML injection  . isosorbide mononitrate (IMDUR) 30 MG 24 hr tablet  . ketotifen (ALLERGY EYE DROPS) 0.025 % ophthalmic solution  . LANTUS SOLOSTAR 100 UNIT/ML Solostar Pen  . multivitamin (RENA-VIT) TABS tablet  . nitroGLYCERIN (NITROSTAT) 0.4 MG SL tablet  . simethicone (GAS-X) 80 MG chewable tablet  . guaiFENesin (MUCINEX) 600 MG 12 hr tablet  . torsemide (DEMADEX) 20 MG tablet    Myra Gianotti, PA-C Surgical Short Stay/Anesthesiology [facility] Phone [phone] [facility] Phone [phone] 02/07/2020 4:41 PM

## 2020-02-07 NOTE — Anesthesia Preprocedure Evaluation (Addendum)
Anesthesia Evaluation  Patient identified by MRN, date of birth, ID band Patient awake    Reviewed: Allergy & Precautions, NPO status , Patient's Chart, lab work & pertinent test results, reviewed documented beta blocker date and time   Airway Mallampati: I  TM Distance: >3 FB Neck ROM: Full    Dental  (+) Teeth Intact, Dental Advisory Given, Missing,    Pulmonary asthma ,  Moderate pulmonary HTN- not a candidate for medical therapy  Does not use symbicort regularly   Pulmonary exam normal breath sounds clear to auscultation       Cardiovascular hypertension, Pt. on medications and Pt. on home beta blockers +CHF (LVEF 25-30%)  Normal cardiovascular exam+ Valvular Problems/Murmurs (mild-mod MR) MR  Rhythm:Regular Rate:Normal  Echo 07/2018 1. The left ventricle has severely reduced systolic function, with an  ejection fraction of 25-30%. The cavity size was normal. Left ventricular  diastolic Doppler parameters are consistent with pseudonormalization.  Elevated mean left atrial pressure.  Left ventrical global hypokinesis without regional wall motion  abnormalities.  2. The right ventricle has normal systolic function. The cavity was  normal. There is no increase in right ventricular wall thickness. Right  ventricular systolic pressure is moderately elevated with an estimated  pressure of 59 mmHg.  3. Left atrial size was mildly dilated.  4. Small circumferential pericardial effusion.  5. Mild to moderate mitral valve regurgitation, jet centrally-directed.  6. The inferior vena cava was dilated in size with <50% respiratory  variability.  7. When compared to the prior study: There is little change since  07/15/2017.   Cath 2019: Mild to moderate PAH with normal PCWP and normal outputs  Stress test 2016:  Nuclear stress EF: 70%.  There was no ST segment deviation noted during stress.  The study is normal.  This is  a low risk study.  The left ventricular ejection fraction is hyperdynamic (>65%).  no evidence of ischemia.    Neuro/Psych  Headaches, PSYCHIATRIC DISORDERS Anxiety Chronic neck pain, ACDF 2015 B/L UE numbness from cervical myelopathy     GI/Hepatic Neg liver ROS, GERD  Controlled and Medicated,  Endo/Other  diabetes, Poorly Controlled, Type 2, Insulin DependentLast a1c 10.4 FS 90 this AM- starts feeling lightheaded around 100  Renal/GU ESRF and DialysisRenal diseaseHD yesterday  Has been on HD since May 2021  negative genitourinary   Musculoskeletal  (+) Arthritis , Osteoarthritis,    Abdominal Normal abdominal exam  (+)   Peds  Hematology  (+) Blood dyscrasia, anemia ,   Anesthesia Other Findings   Reproductive/Obstetrics negative OB ROS                            Anesthesia Physical Anesthesia Plan  ASA: IV  Anesthesia Plan: MAC and Regional   Post-op Pain Management:    Induction:   PONV Risk Score and Plan: 2 and Propofol infusion and TIVA  Airway Management Planned: Natural Airway and Simple Face Mask  Additional Equipment: None  Intra-op Plan:   Post-operative Plan:   Informed Consent: I have reviewed the patients History and Physical, chart, labs and discussed the procedure including the risks, benefits and alternatives for the proposed anesthesia with the patient or authorized representative who has indicated his/her understanding and acceptance.       Plan Discussed with: CRNA  Anesthesia Plan Comments:       Anesthesia Quick Evaluation

## 2020-02-11 ENCOUNTER — Other Ambulatory Visit (HOSPITAL_COMMUNITY)
Admission: RE | Admit: 2020-02-11 | Discharge: 2020-02-11 | Disposition: A | Discharge status: Home / Self Care | Payer: Medicare Other | Source: Ambulatory Visit | Attending: Vascular Surgery | Admitting: Vascular Surgery

## 2020-02-11 DIAGNOSIS — Z20822 Contact with and (suspected) exposure to covid-19: Secondary | ICD-10-CM | POA: Diagnosis not present

## 2020-02-11 DIAGNOSIS — Z01812 Encounter for preprocedural laboratory examination: Secondary | ICD-10-CM | POA: Insufficient documentation

## 2020-02-11 LAB — SARS CORONAVIRUS 2 (TAT 6-24 HRS): SARS Coronavirus 2: NEGATIVE

## 2020-02-12 ENCOUNTER — Other Ambulatory Visit: Payer: Self-pay

## 2020-02-12 ENCOUNTER — Encounter (HOSPITAL_COMMUNITY): Payer: Self-pay | Admitting: Vascular Surgery

## 2020-02-12 NOTE — Progress Notes (Addendum)
Spoke with Belinda [name] for pre-op call. Belinda [name] has hx of CHF (sees Dr. Haroldine Laws) and states she has not had any recent chest pain or shortness of breath. Belinda [name] is a type 2 diabetic. Last A1C was 9.6 on 01/10/20 (got this info from the Dialysis center on [facility]). She states her fasting blood sugar is usually between 100-200. She states her Lantus Insulin was increased to 18 units in July. Instructed Belinda [name] to take 1/2 of her regular dose of Lantus Insulin (she will take 9 units) this pm and not to use her bedtime dose of Humalog insulin. Instructed her to check her blood sugar when she gets up in the AM and every 2 hours until she leaves for the hospital. If blood sugar is >220 take 1/2 of usual correction dose of Humalog insulin. If blood sugar is 70 or below, treat with 1/2 cup of clear juice (apple or cranberry) and recheck blood sugar 15 minutes after drinking juice. If blood sugar continues to be 70 or below, call the Short Stay department and ask to speak to a nurse. Belinda [name] voiced understanding.  Covid test done 02/11/20 and it's negative. Belinda [name] states she has been in quarantine since the test was done and will stay in it until she comes tomorrow for surgery. She does have dialysis today, but understands to wear mask, wash hands and social distance while there.   See Allison's note.

## 2020-02-13 ENCOUNTER — Encounter (HOSPITAL_COMMUNITY): Payer: Self-pay | Admitting: Vascular Surgery

## 2020-02-13 ENCOUNTER — Ambulatory Visit (HOSPITAL_COMMUNITY): Payer: Medicare Other | Admitting: Vascular Surgery

## 2020-02-13 ENCOUNTER — Encounter (HOSPITAL_COMMUNITY)
Admission: RE | Disposition: A | Discharge status: Home / Self Care | Payer: Self-pay | Source: Home / Self Care | Attending: Vascular Surgery

## 2020-02-13 ENCOUNTER — Ambulatory Visit (HOSPITAL_COMMUNITY)
Admission: RE | Admit: 2020-02-13 | Discharge: 2020-02-13 | Disposition: A | Discharge status: Home / Self Care | Payer: Medicare Other | Attending: Vascular Surgery | Admitting: Vascular Surgery

## 2020-02-13 DIAGNOSIS — M47892 Other spondylosis, cervical region: Secondary | ICD-10-CM | POA: Diagnosis not present

## 2020-02-13 DIAGNOSIS — Z981 Arthrodesis status: Secondary | ICD-10-CM | POA: Diagnosis not present

## 2020-02-13 DIAGNOSIS — D631 Anemia in chronic kidney disease: Secondary | ICD-10-CM | POA: Insufficient documentation

## 2020-02-13 DIAGNOSIS — E1122 Type 2 diabetes mellitus with diabetic chronic kidney disease: Secondary | ICD-10-CM | POA: Diagnosis present

## 2020-02-13 DIAGNOSIS — Z7982 Long term (current) use of aspirin: Secondary | ICD-10-CM | POA: Insufficient documentation

## 2020-02-13 DIAGNOSIS — I272 Pulmonary hypertension, unspecified: Secondary | ICD-10-CM | POA: Insufficient documentation

## 2020-02-13 DIAGNOSIS — Z8614 Personal history of Methicillin resistant Staphylococcus aureus infection: Secondary | ICD-10-CM | POA: Diagnosis not present

## 2020-02-13 DIAGNOSIS — N186 End stage renal disease: Secondary | ICD-10-CM | POA: Diagnosis not present

## 2020-02-13 DIAGNOSIS — M170 Bilateral primary osteoarthritis of knee: Secondary | ICD-10-CM | POA: Diagnosis not present

## 2020-02-13 DIAGNOSIS — Z794 Long term (current) use of insulin: Secondary | ICD-10-CM | POA: Diagnosis not present

## 2020-02-13 DIAGNOSIS — Z79899 Other long term (current) drug therapy: Secondary | ICD-10-CM | POA: Diagnosis not present

## 2020-02-13 DIAGNOSIS — N184 Chronic kidney disease, stage 4 (severe): Secondary | ICD-10-CM

## 2020-02-13 DIAGNOSIS — I509 Heart failure, unspecified: Secondary | ICD-10-CM | POA: Diagnosis not present

## 2020-02-13 DIAGNOSIS — Z992 Dependence on renal dialysis: Secondary | ICD-10-CM | POA: Diagnosis not present

## 2020-02-13 DIAGNOSIS — Z7951 Long term (current) use of inhaled steroids: Secondary | ICD-10-CM | POA: Insufficient documentation

## 2020-02-13 DIAGNOSIS — G959 Disease of spinal cord, unspecified: Secondary | ICD-10-CM | POA: Insufficient documentation

## 2020-02-13 DIAGNOSIS — J45909 Unspecified asthma, uncomplicated: Secondary | ICD-10-CM | POA: Diagnosis not present

## 2020-02-13 DIAGNOSIS — E785 Hyperlipidemia, unspecified: Secondary | ICD-10-CM | POA: Insufficient documentation

## 2020-02-13 DIAGNOSIS — I132 Hypertensive heart and chronic kidney disease with heart failure and with stage 5 chronic kidney disease, or end stage renal disease: Secondary | ICD-10-CM | POA: Diagnosis not present

## 2020-02-13 DIAGNOSIS — N185 Chronic kidney disease, stage 5: Secondary | ICD-10-CM

## 2020-02-13 HISTORY — DX: Secondary pulmonary arterial hypertension: I27.21

## 2020-02-13 HISTORY — PX: AV FISTULA PLACEMENT: SHX1204

## 2020-02-13 LAB — GLUCOSE, CAPILLARY
Glucose-Capillary: 90 mg/dL (ref 70–99)
Glucose-Capillary: 106 mg/dL — ABNORMAL HIGH (ref 70–99)
Glucose-Capillary: 90 mg/dL (ref 70–99)
Glucose-Capillary: 196 mg/dL — ABNORMAL HIGH (ref 70–99)

## 2020-02-13 LAB — POCT I-STAT, CHEM 8
BUN: 42 mg/dL — ABNORMAL HIGH (ref 8–23)
Calcium, Ion: 1.17 mmol/L (ref 1.15–1.40)
Chloride: 101 mmol/L (ref 98–111)
Creatinine, Ser: 4.7 mg/dL — ABNORMAL HIGH (ref 0.44–1.00)
Glucose, Bld: 90 mg/dL (ref 70–99)
HCT: 35 % — ABNORMAL LOW (ref 36.0–46.0)
Hemoglobin: 11.9 g/dL — ABNORMAL LOW (ref 12.0–15.0)
Potassium: 4.1 mmol/L (ref 3.5–5.1)
Sodium: 139 mmol/L (ref 135–145)
TCO2: 26 mmol/L (ref 22–32)

## 2020-02-13 SURGERY — ARTERIOVENOUS (AV) FISTULA CREATION
Anesthesia: Monitor Anesthesia Care | Laterality: Right

## 2020-02-13 MED ORDER — LIDOCAINE-EPINEPHRINE (PF) 1 %-1:200000 IJ SOLN
INTRAMUSCULAR | Status: DC | PRN
  Administered 2020-02-13: 30 mL

## 2020-02-13 MED ORDER — LIDOCAINE-EPINEPHRINE (PF) 1 %-1:200000 IJ SOLN
INTRAMUSCULAR | Status: AC
  Filled 2020-02-13: qty 30

## 2020-02-13 MED ORDER — SODIUM CHLORIDE 0.9 % IV SOLN
INTRAVENOUS | Status: DC | PRN
  Administered 2020-02-13: 500 mL

## 2020-02-13 MED ORDER — LIDOCAINE-EPINEPHRINE (PF) 1.5 %-1:200000 IJ SOLN
INTRAMUSCULAR | Status: DC | PRN
  Administered 2020-02-13: 20 mL via PERINEURAL
  Administered 2020-02-13: 10 mL via PERINEURAL

## 2020-02-13 MED ORDER — HEPARIN SODIUM (PORCINE) 1000 UNIT/ML IJ SOLN
1.6000 mL | Freq: Once | INTRAMUSCULAR | Status: AC
  Administered 2020-02-13: 1600 [IU]

## 2020-02-13 MED ORDER — MIDAZOLAM HCL 2 MG/2ML IJ SOLN
INTRAMUSCULAR | Status: AC
  Filled 2020-02-13: qty 2

## 2020-02-13 MED ORDER — SODIUM CHLORIDE 0.9 % IV SOLN
INTRAVENOUS | Status: AC
  Filled 2020-02-13: qty 1.2

## 2020-02-13 MED ORDER — PROPOFOL 500 MG/50ML IV EMUL
INTRAVENOUS | Status: DC | PRN
  Administered 2020-02-13: 60 ug/kg/min via INTRAVENOUS

## 2020-02-13 MED ORDER — SODIUM CHLORIDE 0.9 % IV SOLN: INTRAVENOUS | Status: DC

## 2020-02-13 MED ORDER — MIDAZOLAM HCL 2 MG/2ML IJ SOLN
INTRAMUSCULAR | Status: DC | PRN
  Administered 2020-02-13: 1 mg via INTRAVENOUS

## 2020-02-13 MED ORDER — CHLORHEXIDINE GLUCONATE 4 % EX LIQD: 60.0000 mL | Freq: Once | CUTANEOUS | Status: DC

## 2020-02-13 MED ORDER — DEXTROSE 50 % IV SOLN
INTRAVENOUS | Status: AC
  Filled 2020-02-13: qty 50

## 2020-02-13 MED ORDER — ORAL CARE MOUTH RINSE: 15.0000 mL | Freq: Once | OROMUCOSAL | Status: AC

## 2020-02-13 MED ORDER — PROPOFOL 10 MG/ML IV BOLUS
INTRAVENOUS | Status: DC | PRN
  Administered 2020-02-13 (×2): 15 mg via INTRAVENOUS
  Administered 2020-02-13: 10 mg via INTRAVENOUS

## 2020-02-13 MED ORDER — 0.9 % SODIUM CHLORIDE (POUR BTL) OPTIME
TOPICAL | Status: DC | PRN
  Administered 2020-02-13: 1000 mL

## 2020-02-13 MED ORDER — ONDANSETRON HCL 4 MG/2ML IJ SOLN: 4.0000 mg | Freq: Once | INTRAMUSCULAR | Status: DC | PRN

## 2020-02-13 MED ORDER — FENTANYL CITRATE (PF) 100 MCG/2ML IJ SOLN: 25.0000 ug | INTRAMUSCULAR | Status: DC | PRN

## 2020-02-13 MED ORDER — FENTANYL CITRATE (PF) 250 MCG/5ML IJ SOLN
INTRAMUSCULAR | Status: AC
  Filled 2020-02-13: qty 5

## 2020-02-13 MED ORDER — DEXTROSE 50 % IV SOLN
INTRAVENOUS | Status: DC | PRN
  Administered 2020-02-13: 12.5 g via INTRAVENOUS

## 2020-02-13 MED ORDER — CIPROFLOXACIN IN D5W 400 MG/200ML IV SOLN
400.0000 mg | INTRAVENOUS | Status: AC
  Administered 2020-02-13: 400 mg via INTRAVENOUS
  Filled 2020-02-13: qty 200

## 2020-02-13 MED ORDER — OXYCODONE-ACETAMINOPHEN 5-325 MG PO TABS: 1.0000 | ORAL_TABLET | ORAL | 0 refills | Status: DC | PRN

## 2020-02-13 MED ORDER — PROPOFOL 10 MG/ML IV BOLUS
INTRAVENOUS | Status: AC
  Filled 2020-02-13: qty 20

## 2020-02-13 MED ORDER — FENTANYL CITRATE (PF) 250 MCG/5ML IJ SOLN
INTRAMUSCULAR | Status: DC | PRN
  Administered 2020-02-13 (×2): 25 ug via INTRAVENOUS

## 2020-02-13 MED ORDER — CHLORHEXIDINE GLUCONATE 0.12 % MT SOLN
15.0000 mL | Freq: Once | OROMUCOSAL | Status: AC
  Administered 2020-02-13: 15 mL via OROMUCOSAL
  Filled 2020-02-13: qty 15

## 2020-02-13 SURGICAL SUPPLY — 33 items
ARMBAND PINK RESTRICT EXTREMIT (MISCELLANEOUS) ×2 IMPLANT
CANISTER SUCT 3000ML PPV (MISCELLANEOUS) ×2 IMPLANT
CLIP VESOCCLUDE MED 6/CT (CLIP) ×2 IMPLANT
CLIP VESOCCLUDE SM WIDE 6/CT (CLIP) ×2 IMPLANT
COVER PROBE W GEL 5X96 (DRAPES) ×2 IMPLANT
COVER WAND RF STERILE (DRAPES) ×2 IMPLANT
DERMABOND ADVANCED (GAUZE/BANDAGES/DRESSINGS) ×1
DERMABOND ADVANCED .7 DNX12 (GAUZE/BANDAGES/DRESSINGS) ×1 IMPLANT
ELECT REM PT RETURN 9FT ADLT (ELECTROSURGICAL) ×2
ELECTRODE REM PT RTRN 9FT ADLT (ELECTROSURGICAL) ×1 IMPLANT
GLOVE BIO SURGEON STRL SZ 6.5 (GLOVE) ×2 IMPLANT
GLOVE BIO SURGEON STRL SZ7.5 (GLOVE) ×2 IMPLANT
GLOVE BIOGEL PI IND STRL 6 (GLOVE) ×1 IMPLANT
GLOVE BIOGEL PI IND STRL 7.0 (GLOVE) ×2 IMPLANT
GLOVE BIOGEL PI INDICATOR 6 (GLOVE) ×1
GLOVE BIOGEL PI INDICATOR 7.0 (GLOVE) ×2
GOWN STRL REUS W/ TWL LRG LVL3 (GOWN DISPOSABLE) ×2 IMPLANT
GOWN STRL REUS W/ TWL XL LVL3 (GOWN DISPOSABLE) ×1 IMPLANT
GOWN STRL REUS W/TWL LRG LVL3 (GOWN DISPOSABLE) ×4
GOWN STRL REUS W/TWL XL LVL3 (GOWN DISPOSABLE) ×2
INSERT FOGARTY SM (MISCELLANEOUS) ×2 IMPLANT
KIT BASIN OR (CUSTOM PROCEDURE TRAY) ×2 IMPLANT
KIT TURNOVER KIT B (KITS) ×2 IMPLANT
NS IRRIG 1000ML POUR BTL (IV SOLUTION) ×2 IMPLANT
PACK CV ACCESS (CUSTOM PROCEDURE TRAY) ×2 IMPLANT
PAD ARMBOARD 7.5X6 YLW CONV (MISCELLANEOUS) ×4 IMPLANT
SUT MNCRL AB 4-0 PS2 18 (SUTURE) ×2 IMPLANT
SUT PROLENE 6 0 BV (SUTURE) ×2 IMPLANT
SUT VIC AB 3-0 SH 27 (SUTURE) ×4
SUT VIC AB 3-0 SH 27X BRD (SUTURE) ×2 IMPLANT
TOWEL GREEN STERILE (TOWEL DISPOSABLE) ×2 IMPLANT
UNDERPAD 30X36 HEAVY ABSORB (UNDERPADS AND DIAPERS) ×2 IMPLANT
WATER STERILE IRR 1000ML POUR (IV SOLUTION) ×2 IMPLANT

## 2020-02-13 NOTE — Transfer of Care (Signed)
Immediate Anesthesia Transfer of Care Note  Patient: Belinda [name]  Procedure(s) Performed: ARTERIOVENOUS (AV) FISTULA CREATION (Right )  Patient Location: PACU  Anesthesia Type:MAC combined with regional for post-op pain  Level of Consciousness: awake, alert , oriented and patient cooperative  Airway & Oxygen Therapy: Patient Spontanous Breathing  Post-op Assessment: Report given to RN and Post -op Vital signs reviewed and stable  Post vital signs: Reviewed and stable  Last Vitals:  Vitals Value Taken Time  BP 141/86 02/13/20 0923  Temp    Pulse 100 02/13/20 0922  Resp 14 02/13/20 0923  SpO2 100 % 02/13/20 0922  Vitals shown include unvalidated device data.  Last Pain:  Vitals:   02/13/20 0740  TempSrc:   PainSc: 4       Patients Stated Pain Goal: 3 (53/29/92 4268)  Complications: No complications documented.

## 2020-02-13 NOTE — H&P (Signed)
HPI:  Belinda [name] is a 69 y.o. female left hand dominant with end-stage renal disease currently dialyzing via catheter.  She has never had chest, breast or upper extremity surgery.  She does not take blood thinners.  Currently dialyzes Tuesdays Thursdays Saturdays.  She does have numbness of her bilateral upper extremities due to cervical disc disease.      Past Medical History:  Diagnosis Date  . Acute CHF (congestive heart failure) ([place]) 02/02/2018  . Anemia    "get monthly injections to boost my HgB" (02/02/2018)  . Anxiety   . Asthma   . Back pain    intractable low back  . Chronic neck pain   . CKD (chronic kidney disease), stage III   . Dysrhythmia    palpitations or heart racing periodic. has had for years- Dr Mare Ferrari follows.  [name] GERD (gastroesophageal reflux disease)   . History of blood transfusion 2016   After knee replacemnet  . Hx MRSA infection   . Hyperlipidemia   . Hypertension   . Migraines    "~ 2/month" (02/02/2018)  . Osteoarthritis    "knees, neck" (02/02/2018)  . Palpitations   . Pneumonia    "twice in 2018" (02/02/2018)  . Seasonal allergies   . Type II diabetes mellitus (HCC)    insulin dependent - fasting 140-200         Family History  Problem Relation Age of Onset  . Heart disease Mother   . Heart failure Father   . Colon cancer Neg Hx   . Stomach cancer Neg Hx   . Esophageal cancer Neg Hx    Past Surgical History:  Procedure Laterality Date  . ABDOMINAL HYSTERECTOMY  03/2001   Abdominal supracervical hysterectomy, left salpingo-oophorectomy  . ANTERIOR CERVICAL DECOMP/DISCECTOMY FUSION N/A 01/01/2014   Procedure: CERVICAL THREE-FOUR,CERVICAL FIVE-SIX,CERVICAL SIX-SEVEN ANTERIOR CERVICAL DECOMPRESSION/DISCECTOMY/FUSION;  Surgeon: Kristeen Miss, MD;  Location: [facility];  Service: Neurosurgery;  Laterality: N/A;  left-side approach  . BACK SURGERY    . CATARACT EXTRACTION W/ INTRAOCULAR LENS   IMPLANT, BILATERAL Bilateral 2012  . CESAREAN SECTION    . DILATION AND CURETTAGE OF UTERUS  X 2  . HAND SURGERY Left 1970s   laceration repair 4th and 5th digits  . JOINT REPLACEMENT    . KNEE ARTHROSCOPY Left    torn mensicus  . NASAL SINUS SURGERY    . RIGHT HEART CATH N/A 06/23/2018   Procedure: RIGHT HEART CATH;  Surgeon: Jolaine Artist, MD;  Location: [facility];  Service: Cardiovascular;  Laterality: N/A;  . TOTAL KNEE ARTHROPLASTY Left 02/12/2015   Procedure: TOTAL KNEE ARTHROPLASTY;  Surgeon: [name], MD;  Location: [facility];  Service: Orthopedics;  Laterality: Left;    Short Social History:  Social History       Tobacco Use  . Smoking status: Never Smoker  . Smokeless tobacco: Never Used  Substance Use Topics  . Alcohol use: Never         Allergies  Allergen Reactions  . Entresto [Sacubitril-Valsartan] Itching  . Omnicef [Cefdinir] Other (See Comments)    GI Upset  . Aspirin Other (See Comments)    GI issues, Can tolerate low aspirin  . Augmentin [Amoxicillin-Pot Clavulanate] Nausea And Vomiting and Other (See Comments)    GI issues DID THE REACTION INVOLVE: Swelling of the face/tongue/throat, SOB, or low BP? No Sudden or severe rash/hives, skin peeling, or the inside of the mouth or nose? No Did it  require medical treatment? No When did it last happen?long time  If all above answers are "NO", may proceed with cephalosporin use.   . Erythromycin Nausea And Vomiting and Other (See Comments)    Gi issues  . Gabapentin Swelling  . Nickel Itching and Rash    Breakouts           Current Outpatient Medications  Medication Sig Dispense Refill  . acetaminophen (TYLENOL) 650 MG CR tablet Take 650 mg by mouth every 8 (eight) hours as needed for pain (pain).     [name] albuterol (PROVENTIL) (2.5 MG/3ML) 0.083% nebulizer solution Inhale into the lungs.    [name] albuterol (PROVENTIL) (2.5 MG/3ML) 0.083% nebulizer  solution SMARTSIG:1 Vial(s) Via Nebulizer 4 Times Daily PRN    . Alpha-D-Galactosidase (BEANO PO) Take 1-2 tablets by mouth daily as needed (gas).    . Ascorbic Acid (VITAMIN C) 1000 MG tablet Take 1,000 mg by mouth at bedtime.    [name] aspirin 81 MG tablet Take 81 mg by mouth daily.    . Azelastine HCl 0.15 % SOLN Place 1 spray into both nostrils 2 (two) times daily as needed (allergies).     . budesonide-formoterol (SYMBICORT) 160-4.5 MCG/ACT inhaler Inhale 2 puffs into the lungs 2 (two) times daily as needed (shortness of breath).     . carvedilol (COREG) 12.5 MG tablet Take 12.5 mg by mouth 2 (two) times daily.    . Cholecalciferol (VITAMIN D-3) 5000 UNITS TABS Take 5,000 Units by mouth daily.    . diclofenac sodium (VOLTAREN) 1 % GEL Apply 1 application topically as needed (pain).    [name] ezetimibe-simvastatin (VYTORIN) 10-40 MG per tablet Take 1 tablet by mouth at bedtime.     . ferric citrate (AURYXIA) 1 GM 210 MG(Fe) tablet Take by mouth.    . ferrous gluconate (FERGON) 240 (27 FE) MG tablet Take 240 mg by mouth daily.     . fexofenadine (ALLEGRA) 180 MG tablet Take 180 mg by mouth daily.    [name] guaiFENesin (MUCINEX) 600 MG 12 hr tablet Take 600 mg by mouth daily.    . hydrALAZINE (APRESOLINE) 50 MG tablet TAKE 1 TABLET BY MOUTH THREE TIMES A DAY 270 tablet 1  . insulin lispro (HUMALOG) 100 UNIT/ML injection Inject 2-10 Units into the skin 3 (three) times daily before meals.    . isosorbide mononitrate (IMDUR) 30 MG 24 hr tablet Take 0.5 tablets (15 mg total) by mouth daily. 30 tablet 0  . LANTUS SOLOSTAR 100 UNIT/ML Solostar Pen Inject 12 Units into the skin at bedtime.   3  . metolazone (ZAROXOLYN) 5 MG tablet Take 1 tablet (5 mg total) by mouth every other day. And as needed for weight gain/swelling 15 tablet 5  . Multiple Vitamins-Minerals (ALIVE WOMENS ENERGY PO) Take 1 tablet by mouth daily.    . multivitamin (RENA-VIT) TABS tablet Take by mouth.    .  nitroGLYCERIN (NITROSTAT) 0.4 MG SL tablet Place 0.4 mg under the tongue every 5 (five) minutes as needed for chest pain.   0  . potassium chloride (K-DUR) 10 MEQ tablet Take 10-20 mEq by mouth See admin instructions. Take 20 meq in the morning an 10 meq at night    . promethazine (PHENERGAN) 12.5 MG tablet Take by mouth.    . simethicone (GAS-X) 80 MG chewable tablet Chew 80-160 mg by mouth daily as needed for flatulence.    . torsemide (DEMADEX) 20 MG tablet TAKE 3 TABLETS BY MOUTH EVERY MORNING AND  TAKE 2 TABLETS EVERY EVENING 450 tablet 1  . traMADol (ULTRAM) 50 MG tablet SMARTSIG:4 Tablet(s) By Mouth Every Night PRN     No current facility-administered medications for this visit.    Review of Systems  Constitutional:  Constitutional negative. HENT: HENT negative.  Eyes: Eyes negative.  Respiratory: Respiratory negative.  Cardiovascular: Cardiovascular negative.  GI: Gastrointestinal negative.  Musculoskeletal: Musculoskeletal negative.  Skin: Skin negative.  Neurological: Positive for numbness.  Hematologic: Hematologic/lymphatic negative.  Psychiatric: Psychiatric negative.        Objective:   Vitals:   02/13/20 0656  BP: (!) 146/80  Pulse: 88  Resp: 18  Temp: 97.7 F (36.5 C)  SpO2: 100%    Physical Exam Constitutional:      Appearance: Normal appearance.  HENT:     Nose:     Comments: Mask in place Eyes:     Pupils: Pupils are equal, round, and reactive to light.  Cardiovascular:     Pulses:          Radial pulses are 2+ on the right side and 2+ on the left side.     Comments: Bilateral brachial arteries are palpable Pulmonary:     Effort: Pulmonary effort is normal.  Abdominal:     General: Abdomen is flat.     Palpations: Abdomen is soft.  Musculoskeletal:        General: No swelling. Normal range of motion.  Skin:    General: Skin is warm and dry.     Capillary Refill: Capillary refill takes less than 2 seconds.  Neurological:      General: No focal deficit present.     Mental Status: She is alert.  Psychiatric:        Mood and Affect: Mood normal.        Behavior: Behavior normal.        Thought Content: Thought content normal.        Judgment: Judgment normal.       Assessment/Plan:   69yo female here for permanent access. Plan is for right arm avf vs more likely graft. We again discussed risks and benefits and she agrees to proceed.   Kataryna Mcquilkin C. Donzetta Matters, MD [facility] Office: [phone] Pager: [phone]

## 2020-02-13 NOTE — Anesthesia Procedure Notes (Signed)
Anesthesia Regional Block: Supraclavicular block   Pre-Anesthetic Checklist: ,, timeout performed, Correct Patient, Correct Site, Correct Laterality, Correct Procedure, Correct Position, site marked, Risks and benefits discussed,  Surgical consent,  Pre-op evaluation,  At surgeon's request and post-op pain management  Laterality: Right  Prep: Maximum Sterile Barrier Precautions used, chloraprep       Needles:  Injection technique: Single-shot  Needle Type: Echogenic Stimulator Needle     Needle Length: 9cm  Needle Gauge: 22     Additional Needles:   Procedures:,,,, ultrasound used (permanent image in chart),,,,  Narrative:  Start time: 02/13/2020 8:10 AM End time: 02/13/2020 8:18 AM Injection made incrementally with aspirations every 5 mL.  Performed by: Personally  Anesthesiologist: Pervis Hocking, DO  Additional Notes: Monitors applied. No increased pain on injection. No increased resistance to injection. Injection made in 5cc increments. Good needle visualization. Patient tolerated procedure well.  37mL supraclavicular block, 73mL infiltration for intercostobrachial block

## 2020-02-13 NOTE — Anesthesia Postprocedure Evaluation (Signed)
Anesthesia Post Note  Patient: CHARLAINE UTSEY  Procedure(s) Performed: ARTERIOVENOUS (AV) FISTULA CREATION (Right )     Patient location during evaluation: PACU Anesthesia Type: Regional and MAC Level of consciousness: awake and alert Pain management: pain level controlled Vital Signs Assessment: post-procedure vital signs reviewed and stable Respiratory status: spontaneous breathing, nonlabored ventilation and respiratory function stable Cardiovascular status: blood pressure returned to baseline and stable Postop Assessment: no apparent nausea or vomiting Anesthetic complications: no   No complications documented.  Last Vitals:  Vitals:   02/13/20 0953 02/13/20 1008  BP: (!) 144/88 (!) 145/71  Pulse: 97 95  Resp: 15 13  Temp:  37 C  SpO2: 100% 100%    Last Pain:  Vitals:   02/13/20 1008  TempSrc:   PainSc: 0-No pain                 Pervis Hocking

## 2020-02-13 NOTE — Anesthesia Procedure Notes (Signed)
Procedure Name: MAC Date/Time: 02/13/2020 8:30 AM Performed by: Janace Litten, CRNA Pre-anesthesia Checklist: Patient identified, Emergency Drugs available, Suction available and Patient being monitored Patient Re-evaluated:Patient Re-evaluated prior to induction Oxygen Delivery Method: Simple face mask

## 2020-02-13 NOTE — Op Note (Signed)
    Patient name: Belinda [name] MRN: [id number] DOB: May 12, 1951 Sex: female  02/13/2020 Pre-operative Diagnosis: esrd Post-operative diagnosis:  Same Surgeon:  Erlene Quan C. Donzetta Matters, MD Assistant: Paulo Fruit, PA Procedure Performed:   Right arm first stage basilic vein av fistula creation  Indications: 69 year old female on dialysis via right IJ tunnel catheter.  She is now indicated for permanent dialysis access.  Findings: By on table ultrasound identified a basilic vein measuring 4 mm diameter.  There was branching at the antecubitum.  We then created fistula above the antecubitum.  The artery was free of disease was approximately 3 mm.  At completion there was a strong thrill in the fistula confirmed with Doppler and a radial artery signal at the wrist that did augment with compression of the fistula.   Procedure:  The patient was identified in the holding area and taken to the operating room where she is placed supine on operative table and MAC anesthesia was induced.  A preoperative block of been placed.  Timeout was called antibiotics were administered.  The block was tested.  We did inject minimal 1% lidocaine with epinephrine at the site of the incision site.  Transverse incision was made above the antecubitum.  We dissected down to the vein this appeared to be very healthy.  Was marked for orientation.  We divided branches between clips and ties.  We then dissected through the deep fascia the brachial artery placed a vessel loop around this.  The vein was then clipped distally and transected.  It was flushed with heparinized saline spatulated and clamped.  We then clamped the artery distally proximally opened longitudinally and flushed with heparinized saline distally.  We then sewed the vein end-to-side with 6-0 Prolene suture.  Prior to completion we let flushing all directions but upon completion there was a very strong thrill in the vein confirmed with Doppler.  She also had a radial  artery signal at the wrist confirmed with Doppler it did augment with compression of the fistula.  Satisfied we obtain hemostasis in the wound irrigated and closed in layers with Vicryl and Monocryl.  Dermabond placed at the level of the skin.  She was awakened from anesthesia having tolerated procedure without immediate complication but all counts were correct at completion.  EBL: 20 cc    Haily Caley C. Donzetta Matters, MD [facility] Office: [phone] Pager: [phone]

## 2020-02-13 NOTE — Progress Notes (Signed)
Orthopedic Tech Progress Note Patient Details:  Belinda [name] 02/19/1951 [id number] PACU RN called requesting an AR SLING Ortho Devices Type of Ortho Device: Arm sling Ortho Device/Splint Location: RLE Ortho Device/Splint Interventions: Application, Adjustment   Post Interventions Patient Tolerated: Well Instructions Provided: Care of [name] 02/13/2020, 10:25 AM

## 2020-02-13 NOTE — Discharge Instructions (Signed)
[facility]  Discharge Instructions  AV Fistula or Graft Surgery for Dialysis Access  Please refer to the following instructions for your post-procedure care. Your surgeon or physician assistant will discuss any changes with you.  Activity  You may drive the day following your surgery, if you are comfortable and no longer taking prescription pain medication. Resume full activity as the soreness in your incision resolves.  Bathing/Showering  You may shower after you go home. Keep your incision dry for 48 hours. Do not soak in a bathtub, hot tub, or swim until the incision heals completely. You may not shower if you have a hemodialysis catheter.  Incision Care  Clean your incision with mild soap and water after 48 hours. Pat the area dry with a clean towel. You do not need a bandage unless otherwise instructed. Do not apply any ointments or creams to your incision. You may have skin glue on your incision. Do not peel it off. It will come off on its own in about one week. Your arm may swell a bit after surgery. To reduce swelling use pillows to elevate your arm so it is above your heart. Your doctor will tell you if you need to lightly wrap your arm with an ACE bandage.  Diet  Resume your normal diet. There are not special food restrictions following this procedure. In order to heal from your surgery, it is CRITICAL to get adequate nutrition. Your body requires vitamins, minerals, and protein. Vegetables are the best source of vitamins and minerals. Vegetables also provide the perfect balance of protein. Processed food has little nutritional value, so try to avoid this.  Medications  Resume taking all of your medications. If your incision is causing pain, you may take over-the counter pain relievers such as acetaminophen (Tylenol). If you were prescribed a stronger pain medication, please be aware these medications can cause nausea and constipation. Prevent  nausea by taking the medication with a snack or meal. Avoid constipation by drinking plenty of fluids and eating foods with high amount of fiber, such as fruits, vegetables, and grains.  Do not take Tylenol if you are taking prescription pain medications.  Follow up Your surgeon may want to see you in the office following your access surgery. If so, this will be arranged at the time of your surgery.  Please call us immediately for any of the following conditions:  . Increased pain, redness, drainage (pus) from your incision site . Fever of 101 degrees or higher . Severe or worsening pain at your incision site . Hand pain or numbness. .  Reduce your risk of vascular disease:  . Stop smoking. If you would like help, call [facility] at 1-800-QUIT-NOW (956) 335-1736) or [facility] at 279-210-3330  . Manage your cholesterol . Maintain a desired weight . Control your diabetes . Keep your blood pressure down  Dialysis  It will take several weeks to several months for your new dialysis access to be ready for use. Your surgeon will determine when it is okay to use it. Your nephrologist will continue to direct your dialysis. You can continue to use your Permcath until your new access is ready for use.   02/13/2020 Belinda [name] [id number] 07-11-1950  Surgeon(s): Waynetta Sandy, MD  Procedure(s): ARTERIOVENOUS (AV) FISTULA CREATION   May stick graft immediately   May stick graft on designated area only:   X Do not stick right AV fistula for 12 weeks    If  you have any questions, please call the office at [phone].

## 2020-02-14 ENCOUNTER — Encounter (HOSPITAL_COMMUNITY): Payer: Self-pay | Admitting: Vascular Surgery

## 2020-02-29 ENCOUNTER — Ambulatory Visit: Payer: Medicare Other | Admitting: Adult Health

## 2020-03-10 ENCOUNTER — Other Ambulatory Visit: Payer: Self-pay

## 2020-03-10 DIAGNOSIS — N186 End stage renal disease: Secondary | ICD-10-CM

## 2020-03-13 NOTE — Progress Notes (Signed)
Advanced Heart Failure Clinic Note   PCP: Reynold Bowen, MD PCP-Cardiologist: Pixie Casino, MD  Nephrologist: Dr Hollie Salk  HPI: Belinda [name] is a 69 y.o. 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 AHF team consulted. Diuresed 40 lbs with lasix drip and metolazone. Myeloma panel and PYP scan negative. Myoview as below. No LHC with elevated creatinine. She had AKI with creatinine peak 3.1. She was also treated for a UTI. HF meds adjusted as able, limited by CKD. DC weight: 161 lbs  RHC 12/19 RA = 4 RV = 57/8 PA = 57/26 (40) PCW = 15 Fick cardiac output/index = 5.0/3.2 Thermo CO/CI = 6.22/3.9 PVR = 5.0 WU Ao sat = 96% PA sat = 69%, 71%  She presents today for routine follow up. Now on HD since 11/01/19. Fluid much better. Breathing better. Having low BPs on machine. Hydralazine stopped. Using carvedilol and imdur only prn for HTN on non-HD days. Uses WC when she go out. At home gets around slowly on her own. No CP.   Studies:  PFTS 7/20  FEV1 1.28 (77%) FVC 1.58 (74%)  DLCO 53%   Echo 2/20: EF 25-30% mild to moderate MR RVSP 59. RV ok.   Echo 11/19 EF 25-30% mild MR  Echo (02/07/18) EF 45-50% with mild RV HK. Moderate TR. Small to moderate pericardial effusion echo texture suggestive of possible amyloid.    ABD [place] 04/21/18 with ? Elevated R heart pressures with distended IVC and hepatic veins. Small amount of perihepatic ascites and R medical renal disease.    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  Myeloma Panel negative.   Myoview scan reviewed by Dr Haroldine Laws and notable for mild to moderate anterior ischemia - cannot exclude breast attenuation. Given lack of CP and creatinine ~3 will treat medically.  FH: CAD in mother, CHF in father  SH: No ETOH, tobacco, or drug  use   Review of systems complete and found to be negative unless listed in HPI.    Past Medical History:  Diagnosis Date  . Acute CHF (congestive heart failure) ([place]) 02/02/2018  . Anemia    "get monthly injections to boost my HgB" (02/02/2018)  . Anxiety   . Asthma   . Back pain    intractable low back  . Chronic neck pain   . CKD (chronic kidney disease), stage III    on dialysis T/Th/Sa  . Dysrhythmia    palpitations or heart racing periodic. has had for years- Dr Mare Ferrari follows.  [name] GERD (gastroesophageal reflux disease)   . History of blood transfusion 2016   After knee replacemnet  . Hx MRSA infection   . Hyperlipidemia   . Hypertension   . Migraines    "~ 2/month" (02/02/2018)  . Osteoarthritis    "knees, neck" (02/02/2018)  . Palpitations   . Pneumonia    "twice in 2018" (02/02/2018)  . Pulmonary artery hypertension ([place])   . Seasonal allergies   . Type II diabetes mellitus (HCC)    insulin dependent - fasting 140-200     Current Outpatient Medications  Medication Sig Dispense Refill  . acetaminophen (TYLENOL) 650 MG CR tablet Take 650-1,300 mg by mouth every 8 (eight) hours as needed for pain (pain).     [name] albuterol (PROVENTIL) (2.5 MG/3ML) 0.083% nebulizer solution Take  2.5 mg by nebulization 4 (four) times daily as needed (wheezing/shortness of breath.).     . Alpha-D-Galactosidase (BEANO PO) Take 1-2 tablets by mouth daily as needed (gas). With certain foods.    [name] aspirin 81 MG tablet Take 81 mg by mouth 3 (three) times a week.     . Azelastine HCl 0.15 % SOLN Place 1 spray into both nostrils daily.     . budesonide-formoterol (SYMBICORT) 160-4.5 MCG/ACT inhaler Inhale 2 puffs into the lungs 2 (two) times daily as needed (shortness of breath).     . carvedilol (COREG) 12.5 MG tablet Take 12.5 mg by mouth 2 (two) times daily.    . diclofenac sodium (VOLTAREN) 1 % GEL Apply 1 application topically as needed (neck pain).     [name] ezetimibe-simvastatin (VYTORIN) 10-40  MG per tablet Take 1 tablet by mouth at bedtime.     . ferric citrate (AURYXIA) 1 GM 210 MG(Fe) tablet Take 210-420 mg by mouth See admin instructions. Take 2 tablets (420 mg) by mouth 3 times daily with meals & take 1 tablet (210 mg) by mouth with each snack    . fexofenadine (ALLEGRA) 180 MG tablet Take 180 mg by mouth daily.    [name] guaiFENesin (MUCINEX) 600 MG 12 hr tablet Take 600 mg by mouth daily.    . hydrALAZINE (APRESOLINE) 50 MG tablet TAKE 1 TABLET BY MOUTH THREE TIMES A DAY (Patient taking differently: Take 50 mg by mouth in the morning and at bedtime. ) 270 tablet 1  . insulin lispro (HUMALOG) 100 UNIT/ML injection Inject 2-10 Units into the skin 3 (three) times daily before meals. Sliding Scale Insulin    . isosorbide mononitrate (IMDUR) 30 MG 24 hr tablet Take 0.5 tablets (15 mg total) by mouth daily. 30 tablet 0  . ketotifen (ALLERGY EYE DROPS) 0.025 % ophthalmic solution Place 1 drop into both eyes 2 (two) times daily as needed (dry/irritated/allergy eyes.).    [name] LANTUS SOLOSTAR 100 UNIT/ML Solostar Pen Inject 15 Units into the skin at bedtime.   3  . multivitamin (RENA-VIT) TABS tablet Take 1 tablet by mouth daily.     . nitroGLYCERIN (NITROSTAT) 0.4 MG SL tablet Place 0.4 mg under the tongue every 5 (five) minutes x 3 doses as needed for chest pain.   0  . oxyCODONE-acetaminophen (PERCOCET) 5-325 MG tablet Take 1 tablet by mouth every 4 (four) hours as needed for severe pain. 6 tablet 0  . simethicone (GAS-X) 80 MG chewable tablet Chew 80-160 mg by mouth daily as needed for flatulence (indigestion.).      No current facility-administered medications for this encounter.    Allergies  Allergen Reactions  . Entresto [Sacubitril-Valsartan] Itching  . Omnicef [Cefdinir] Other (See Comments)    GI Upset  . Aspirin Other (See Comments)    GI issues, Can tolerate low aspirin  . Augmentin [Amoxicillin-Pot Clavulanate] Nausea And Vomiting and Other (See Comments)    GI issues DID THE  REACTION INVOLVE: Swelling of the face/tongue/throat, SOB, or low BP? No Sudden or severe rash/hives, skin peeling, or the inside of the mouth or nose? No Did it require medical treatment? No When did it last happen?long time  If all above answers are "NO", may proceed with cephalosporin use.   . Erythromycin Nausea And Vomiting and Other (See Comments)    Gi issues  . Gabapentin Swelling  . Nickel Itching and Rash    Breakouts       Social History  Socioeconomic History  . Marital status: Married    Spouse name: Not on file  . Number of children: 1  . Years of education: Not on file  . Highest education level: Not on file  Occupational History  . Not on file  Tobacco Use  . Smoking status: Never Smoker  . Smokeless tobacco: Never Used  Vaping Use  . Vaping Use: Never used  Substance and Sexual Activity  . Alcohol use: Never  . Drug use: Never  . Sexual activity: Not Currently  Other Topics Concern  . Not on file  Social History Narrative  . Not on file   Social Determinants of Health   Financial Resource Strain:   . Difficulty of Paying Living Expenses: Not on file  Food Insecurity:   . Worried About Charity fundraiser in the Last Year: Not on file  . Ran Out of Food in the Last Year: Not on file  Transportation Needs:   . Lack of Transportation (Medical): Not on file  . Lack of Transportation (Non-Medical): Not on file  Physical Activity:   . Days of Exercise per Week: Not on file  . Minutes of Exercise per Session: Not on file  Stress:   . Feeling of Stress : Not on file  Social Connections:   . Frequency of Communication with Friends and Family: Not on file  . Frequency of Social Gatherings with Friends and Family: Not on file  . Attends Religious Services: Not on file  . Active Member of Clubs or Organizations: Not on file  . Attends Archivist Meetings: Not on file  . Marital Status: Not on file  Intimate Partner Violence:   . Fear  of Current or Ex-Partner: Not on file  . Emotionally Abused: Not on file  . Physically Abused: Not on file  . Sexually Abused: Not on file      Family History  Problem Relation Age of Onset  . Heart disease Mother   . Heart failure Father   . Colon cancer Neg Hx   . Stomach cancer Neg Hx   . Esophageal cancer Neg Hx    There were no vitals filed for this visit. Wt Readings from Last 3 Encounters:  02/13/20 56.7 kg (125 lb)  12/07/19 55.8 kg (123 lb)  08/01/19 56.2 kg (123 lb 12.8 oz)    PHYSICAL EXAM: General:  Elderly. Sitting in WC No resp difficulty. Weak appearing  HEENT: normal Neck: supple. no JVD. Carotids 2+ bilat; no bruits. No lymphadenopathy or thryomegaly appreciated. Cor: PMI nondisplaced. Regular rate & rhythm. No rubs, gallops or murmurs. R chest perm cath Lungs: clear Abdomen: soft, nontender, nondistended. No hepatosplenomegaly. No bruits or masses. Good bowel sounds. Extremities: no cyanosis, clubbing, rash, edema LUE AVF Neuro: alert & orientedx3, cranial nerves grossly intact. moves all 4 extremities w/o difficulty. Affect pleasant    ASSESSMENT & PLAN:  1. Chronic systolic HF with R>L symptoms - Etiology unclear. Echo suggestive of cardiac amyloid but w/u negative. Ischemia and hypertensive CM also possible.  - Myoview scan 8/19  notable for mild to moderate anterior ischemia - cannot exclude breast attenuation. Have been unable to cath with A/CKD IV - PYP scan 02/13/18 Grade 1, HCL 1.02 'Equivocal' for TTR amyloid. Myeloma panel negative.  - ECHO 11/18 EF 45-50% Grade IDD RV mildly dilated. Peak PA pressure 43 mm hg.  - ECHO 02/07/18 EF 25-30% - Echo 2/20 EF 25-30% with global HK - RHC 12/19  moderate  PAH and normal PCWP. - Remains NYHA III  - Volume status now controlled by HD - Carvedilol switched to PRN due to low BP on HD - Hydralazine and imdur and have been stopped - No ACE/ARB/ARNI/spiro with ESRD and low BP - Now that she is on HD we  discussed possibility of cath to exclude critical CAD as cause of her cardiomyopathy. However given lack of angina and fact that she is not a CABG candidate doubt this would change management much and she has opted to defer. Will reconsider if she has anginal symptoms.   2. Pulmonary HTN - Moderate by RHC in 2019.  - Suspect multifactorial - Does not qualify for selective pulmonary artery vasodilators  3. ESRD - Now on HD. Followed by Dr Hollie Salk.  4. Probable OSA - Pending sleep study  5. HTN - Blood pressure now low on HD days, management as aove  6.DM2 - Per PCP      Glori Bickers, MD 03/13/20

## 2020-03-14 ENCOUNTER — Encounter (HOSPITAL_COMMUNITY): Payer: Self-pay | Admitting: Internal Medicine

## 2020-03-14 ENCOUNTER — Other Ambulatory Visit: Payer: Self-pay

## 2020-03-14 ENCOUNTER — Ambulatory Visit (HOSPITAL_COMMUNITY)
Admission: RE | Admit: 2020-03-14 | Discharge: 2020-03-14 | Disposition: A | Discharge status: Home / Self Care | Payer: Medicare Other | Source: Ambulatory Visit | Attending: Internal Medicine | Admitting: Internal Medicine

## 2020-03-14 VITALS — BP 126/58 | HR 106 | Ht 60.0 in | Wt 134.6 lb

## 2020-03-14 DIAGNOSIS — Z992 Dependence on renal dialysis: Secondary | ICD-10-CM | POA: Diagnosis not present

## 2020-03-14 DIAGNOSIS — I10 Essential (primary) hypertension: Secondary | ICD-10-CM | POA: Diagnosis not present

## 2020-03-14 DIAGNOSIS — M1990 Unspecified osteoarthritis, unspecified site: Secondary | ICD-10-CM | POA: Diagnosis not present

## 2020-03-14 DIAGNOSIS — Z794 Long term (current) use of insulin: Secondary | ICD-10-CM | POA: Insufficient documentation

## 2020-03-14 DIAGNOSIS — Z8249 Family history of ischemic heart disease and other diseases of the circulatory system: Secondary | ICD-10-CM | POA: Diagnosis not present

## 2020-03-14 DIAGNOSIS — N186 End stage renal disease: Secondary | ICD-10-CM | POA: Diagnosis not present

## 2020-03-14 DIAGNOSIS — I5022 Chronic systolic (congestive) heart failure: Secondary | ICD-10-CM

## 2020-03-14 DIAGNOSIS — I2721 Secondary pulmonary arterial hypertension: Secondary | ICD-10-CM

## 2020-03-14 DIAGNOSIS — Z7982 Long term (current) use of aspirin: Secondary | ICD-10-CM | POA: Diagnosis not present

## 2020-03-14 DIAGNOSIS — I132 Hypertensive heart and chronic kidney disease with heart failure and with stage 5 chronic kidney disease, or end stage renal disease: Secondary | ICD-10-CM | POA: Diagnosis not present

## 2020-03-14 DIAGNOSIS — Z79899 Other long term (current) drug therapy: Secondary | ICD-10-CM | POA: Diagnosis not present

## 2020-03-14 DIAGNOSIS — E785 Hyperlipidemia, unspecified: Secondary | ICD-10-CM | POA: Insufficient documentation

## 2020-03-14 DIAGNOSIS — J45909 Unspecified asthma, uncomplicated: Secondary | ICD-10-CM | POA: Diagnosis not present

## 2020-03-14 DIAGNOSIS — E1122 Type 2 diabetes mellitus with diabetic chronic kidney disease: Secondary | ICD-10-CM | POA: Diagnosis not present

## 2020-03-14 MED ORDER — NITROGLYCERIN 0.4 MG SL SUBL: 0.4000 mg | SUBLINGUAL_TABLET | SUBLINGUAL | 1 refills | Status: AC | PRN

## 2020-03-14 NOTE — Patient Instructions (Signed)
Please call our office in March to schedule your follow appointment and echocardiogram  Your physician has requested that you have an echocardiogram. Echocardiography is a painless test that uses sound waves to create images of your heart. It provides your doctor with information about the size and shape of your heart and how well your heart's chambers and valves are working. This procedure takes approximately one hour. There are no restrictions for this procedure.   If you have any questions or concerns before your next appointment please send [place] a message through [place] or call our office at [phone].    TO LEAVE A MESSAGE FOR THE NURSE SELECT OPTION 2, PLEASE LEAVE A MESSAGE INCLUDING: . YOUR NAME . DATE OF BIRTH . CALL BACK NUMBER . REASON FOR CALL**this is important as we prioritize the call backs  [place] AS LONG AS YOU CALL BEFORE 4:00 PM  At the [facility], you and your health needs are our priority. As part of our continuing mission to provide you with exceptional heart care, we have created designated Provider Care Teams. These Care Teams include your primary Cardiologist (physician) and Advanced Practice Providers (APPs- Physician Assistants and Nurse Practitioners) who all work together to provide you with the care you need, when you need it.   You may see any of the following providers on your designated Care Team at your next follow up: [name] Dr Glori Bickers . Dr Loralie Champagne . Darrick Grinder, NP . Lyda Jester, PA . Audry Riles, PharmD   Please be sure to bring in all your medications bottles to every appointment.

## 2020-03-24 ENCOUNTER — Encounter: Payer: Self-pay | Admitting: *Deleted

## 2020-03-24 ENCOUNTER — Encounter: Payer: Self-pay | Admitting: Physician Assistant

## 2020-03-24 ENCOUNTER — Other Ambulatory Visit: Payer: Self-pay | Admitting: *Deleted

## 2020-03-24 ENCOUNTER — Other Ambulatory Visit: Payer: Self-pay

## 2020-03-24 ENCOUNTER — Ambulatory Visit (HOSPITAL_COMMUNITY)
Admission: RE | Admit: 2020-03-24 | Discharge: 2020-03-24 | Disposition: A | Discharge status: Home / Self Care | Payer: Medicare Other | Source: Ambulatory Visit | Attending: Vascular Surgery | Admitting: Vascular Surgery

## 2020-03-24 ENCOUNTER — Ambulatory Visit (INDEPENDENT_AMBULATORY_CARE_PROVIDER_SITE_OTHER): Payer: Self-pay | Admitting: Physician Assistant

## 2020-03-24 VITALS — BP 129/70 | HR 96 | Temp 97.6°F | Resp 20 | Ht 60.0 in | Wt 125.0 lb

## 2020-03-24 DIAGNOSIS — N186 End stage renal disease: Secondary | ICD-10-CM

## 2020-03-24 NOTE — H&P (Deleted)
  The note originally documented on this encounter has been moved the the encounter in which it belongs.  

## 2020-03-24 NOTE — H&P (Signed)
POST OPERATIVE OFFICE NOTE    CC:  F/u for surgery  HPI:  This is a 69 y.o. female who is s/p Right arm first stage basilic vein av fistula creation on 02/13/2020 by Dr. Donzetta Matters.  She denise pain, loss of sensation from base line or loss of motor.  She currently has HD on TTS via right TDC.  Pt returns today for follow up.    Allergies  Allergen Reactions  . Entresto [Sacubitril-Valsartan] Itching  . Omnicef [Cefdinir] Other (See Comments)    GI Upset  . Aspirin Other (See Comments)    GI issues, Can tolerate low aspirin  . Augmentin [Amoxicillin-Pot Clavulanate] Nausea And Vomiting and Other (See Comments)    GI issues DID THE REACTION INVOLVE: Swelling of the face/tongue/throat, SOB, or low BP? No Sudden or severe rash/hives, skin peeling, or the inside of the mouth or nose? No Did it require medical treatment? No When did it last happen?long time  If all above answers are "NO", may proceed with cephalosporin use.   . Erythromycin Nausea And Vomiting and Other (See Comments)    Gi issues  . Gabapentin Swelling  . Nickel Itching and Rash    Breakouts     Current Outpatient Medications  Medication Sig Dispense Refill  . acetaminophen (TYLENOL) 650 MG CR tablet Take 650-1,300 mg by mouth every 8 (eight) hours as needed for pain (pain).     [name] albuterol (PROVENTIL) (2.5 MG/3ML) 0.083% nebulizer solution Take 2.5 mg by nebulization 4 (four) times daily as needed (wheezing/shortness of breath.).     . Alpha-D-Galactosidase (BEANO PO) Take 1-2 tablets by mouth daily as needed (gas). With certain foods.    [name] aspirin 81 MG tablet Take 81 mg by mouth 3 (three) times a week.     . Azelastine HCl 0.15 % SOLN Place 1 spray into both nostrils daily.     . budesonide-formoterol (SYMBICORT) 160-4.5 MCG/ACT inhaler Inhale 2 puffs into the lungs 2 (two) times daily as needed (shortness of breath).     . carvedilol (COREG) 12.5 MG tablet Take 12.5 mg by mouth 2 (two) times daily.    [name]  dextrose 50 % solution Dextrose 50%    . diclofenac sodium (VOLTAREN) 1 % GEL Apply 1 application topically as needed (neck pain).     [name] ezetimibe-simvastatin (VYTORIN) 10-40 MG per tablet Take 1 tablet by mouth at bedtime.     . ferric citrate (AURYXIA) 1 GM 210 MG(Fe) tablet Take 210-420 mg by mouth See admin instructions. Take 2 tablets (420 mg) by mouth 3 times daily with meals & take 1 tablet (210 mg) by mouth with each snack    . fexofenadine (ALLEGRA) 180 MG tablet Take 180 mg by mouth daily.    [name] guaiFENesin (MUCINEX) 600 MG 12 hr tablet Take 600 mg by mouth daily.    . insulin lispro (HUMALOG) 100 UNIT/ML injection Inject 2-10 Units into the skin 3 (three) times daily before meals. Sliding Scale Insulin    . isosorbide mononitrate (IMDUR) 30 MG 24 hr tablet Take 0.5 tablets (15 mg total) by mouth daily. 30 tablet 0  . LANTUS SOLOSTAR 100 UNIT/ML Solostar Pen Inject 15 Units into the skin at bedtime.   3  . Methoxy PEG-Epoetin Beta (MIRCERA IJ) Mircera    . multivitamin (RENA-VIT) TABS tablet Take 1 tablet by mouth daily.     . nitroGLYCERIN (NITROSTAT) 0.4 MG SL tablet Place 1 tablet (0.4 mg total) under the tongue every  5 (five) minutes x 3 doses as needed for chest pain. 6 tablet 1  . simethicone (GAS-X) 80 MG chewable tablet Chew 80-160 mg by mouth daily as needed for flatulence (indigestion.).      No current facility-administered medications for this visit.     ROS:  See HPI  Physical Exam:    Incision:  Well healed incision Extremities:  Grip 5/5, incision well healed, palpable thrill in fistula, palpable radial pulse Lungs: non labored breathing  Findings:  +--------------------+----------+-----------------+--------+  AVF         PSV (cm/s)Flow Vol (mL/min)Comments  +--------------------+----------+-----------------+--------+  Native artery inflow  234      998          +--------------------+----------+-----------------+--------+  AVF  Anastomosis     419                 +--------------------+----------+-----------------+--------+     +------------+----------+-------------+---------+--------------------------  ---+  OUTFLOW VEINPSV (cm/s)Diameter (cm) Depth      Describe                            (cm)                    +------------+----------+-------------+---------+--------------------------  ---+  Prox UA     223    0.70    1.76                    +------------+----------+-------------+---------+--------------------------  ---+  Miid UA     535    0.41          brachial vein. fibrous                                  strand.         +------------+----------+-------------+---------+--------------------------  ---+  Mid UA     288    0.71    1.17  confluence with brachial  vein  +------------+----------+-------------+---------+--------------------------  ---+  Dist UA     243    0.56    1.39                    +------------+----------+-------------+---------+--------------------------  ---+    Summary:  Patent fistula.  Anastomosis in the medial distal upper arm.  Basilic vein enters the brachial vein in the mid upper arm.  Fibrous strand in the brachial vein in the mid upper arm.  Decrease in diameter from basilic vein to brachial vein.  Increased velocity at the confluence possible due to fibrous strand,  diameter change, or both.   Assessment/Plan:  This is a 69 y.o. female who is s/p: First stage right basilic av fistula creation for ESRD.  She is currently on HD TTS.    We will schedule her for second stage basilic transposition.     Roxy Horseman PA-C [facility] [phone]  Clinic MD:  Trula Slade

## 2020-04-14 ENCOUNTER — Encounter (HOSPITAL_COMMUNITY): Payer: Self-pay | Admitting: Vascular Surgery

## 2020-04-14 ENCOUNTER — Other Ambulatory Visit (HOSPITAL_COMMUNITY)
Admission: RE | Admit: 2020-04-14 | Discharge: 2020-04-14 | Disposition: A | Discharge status: Home / Self Care | Payer: Medicare Other | Source: Ambulatory Visit | Attending: Vascular Surgery | Admitting: Vascular Surgery

## 2020-04-14 DIAGNOSIS — Z20822 Contact with and (suspected) exposure to covid-19: Secondary | ICD-10-CM | POA: Insufficient documentation

## 2020-04-14 DIAGNOSIS — Z01818 Encounter for other preprocedural examination: Secondary | ICD-10-CM | POA: Diagnosis present

## 2020-04-14 LAB — SARS CORONAVIRUS 2 (TAT 6-24 HRS): SARS Coronavirus 2: NEGATIVE

## 2020-04-14 NOTE — Progress Notes (Addendum)
Denies chest pain or shortness of breath. Cardiologist Dr. Haroldine Laws. Educated on visitation policy and report she is quarantining until after surgery. Below instructions given regarding DM.  How to Manage Your Diabetes Before and After Surgery  How do I manage my blood sugar before surgery? . Check your blood sugar at least 4 times a day, starting 2 days before surgery, to make sure that the level is not too high or low. o Check your blood sugar the morning of your surgery when you wake up and every 2 hours until you get to the Short Stay unit. . If your blood sugar is less than 70 mg/dL, you will need to treat for low blood sugar: o Do not take insulin. o Treat a low blood sugar (less than 70 mg/dL) with  cup of clear juice (cranberry or apple), 4 glucose tablets, 7up, OR glucose gel. Recheck blood sugar in 15 minutes after treatment (to make sure it is greater than 70 mg/dL). If your blood sugar is not greater than 70 mg/dL on recheck, call [phone] o  for further instructions. . Report your blood sugar to the short stay nurse when you get to Short Stay.  . If you are admitted to the hospital after surgery: o Your blood sugar will be checked by the staff and you will probably be given insulin after surgery (instead of oral diabetes medicines) to make sure you have good blood sugar levels. o The goal for blood sugar control after surgery is 80-180 mg/dL.   WHAT DO I DO ABOUT MY DIABETES MEDICATION?  [name] Do not take oral diabetes medicines (pills) the morning of surgery.  . THE NIGHT BEFORE SURGERY, take _______9____ units of Lantus___________insulin.      . The day of surgery, do not take other diabetes injectables, including Byetta (exenatide), Bydureon (exenatide ER), Victoza (liraglutide), or Trulicity (dulaglutide).  . If your CBG is greater than 220 mg/dL, you may take  of your sliding scale (correction) dose of insulin.

## 2020-04-15 ENCOUNTER — Telehealth: Payer: Self-pay

## 2020-04-15 NOTE — Anesthesia Preprocedure Evaluation (Addendum)
Anesthesia Evaluation  Patient identified by MRN, date of birth, ID band Patient awake    Reviewed: Allergy & Precautions, NPO status , Patient's Chart, lab work & pertinent test results  History of Anesthesia Complications Negative for: history of anesthetic complications  Airway Mallampati: II  TM Distance: >3 FB Neck ROM: Full    Dental  (+) Dental Advisory Given,    Pulmonary shortness of breath, asthma , neg sleep apnea, neg recent URI,  Covid-19 Nucleic Acid Test Results Lab Results      Component                Value               Date                      [place]              NEGATIVE            04/14/2020                [place]              NEGATIVE            02/11/2020                [place]              NEGATIVE            01/07/2020                [place]              NEGATIVE            01/19/2019              breath sounds clear to auscultation       Cardiovascular hypertension, Pt. on medications and Pt. on home beta blockers +CHF  + dysrhythmias  Rhythm:Regular  Echo 08/23/18: IMPRESSIONS  1. The left ventricle has severely reduced systolic function, with an  ejection fraction of 25-30%. The cavity size was normal. Left ventricular  diastolic Doppler parameters are consistent with pseudonormalization.  Elevated mean left atrial pressure.  Left ventrical global hypokinesis without regional wall motion  abnormalities.  2. The right ventricle has normal systolic function. The cavity was  normal. There is no increase in right ventricular wall thickness. Right  ventricular systolic pressure is moderately elevated with an estimated  pressure of 59 mmHg.  3. Left atrial size was mildly dilated.  4. Small circumferential pericardial effusion.  5. Mild to moderate mitral valve regurgitation, jet centrally-directed.  6. The inferior vena cava was dilated in size with <50% respiratory   variability.  7. When compared to the prior study: There is little change since  07/15/2017.     Neuro/Psych  Headaches,  Neuromuscular disease    GI/Hepatic Neg liver ROS, GERD  ,  Endo/Other  diabetes, Type 2, Insulin Dependent  Renal/GU Dialysis and ESRFRenal disease     Musculoskeletal  (+) Arthritis ,   Abdominal   Peds  Hematology  (+) Blood dyscrasia, anemia , Lab Results      Component                Value               Date  WBC                      6.1                 01/29/2019                HGB                      13.9                04/16/2020                HCT                      41.0                04/16/2020                MCV                      83.1                01/29/2019                PLT                      217                 01/29/2019              Anesthesia Other Findings  Patient is a 69 year old female scheduled for the above procedure. She started hemodialysis ~ May 2021 via right IJ tunneled HD catheter. S/p first stage right basilic vein transposition on 02/13/20.    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   Patient last evaluated by Dr. Haroldine Laws on 03/14/20. He noted the etiology of chronic systolic CHF unclear. Echo had suggested cardiac amyloid but work-up negative. Myoview in 01/2018 was notable for mild to moderate anterior ischemia, cannot exclude breast attenuation, but LHC deferred at that time given advanced kidney  disease. ICD not pursued given poor functional capacity and impeding hemodialysis. 05/2018 RHC showed moderate pulmonary hypertension, he suspected multifactorial. She did not qualify for selective pulmonary artery vasodilators. Sleep study ordered earlier this year, but not yet done. Since she was now on hemodialysis, he discussed whether or not to proceed with cardiac cath to exclude critical CAD as cause of her cardiomyopathy; "However given lack of angina and fact that she is not a CABG candidate doubt this would change management much and she has opted to defer. Will reconsider if she has anginal symptoms." He wants her to call in March 2022 to schedule echocardiogram and follow-up visit.   04/14/2020 presurgical COVID-19 test negative.  She is a same-day work-up, so labs and anesthesia team to evaluate on the day of surgery.  Reproductive/Obstetrics                           Anesthesia Physical Anesthesia Plan  ASA: IV  Anesthesia Plan:  MAC and Regional   Post-op Pain Management:    Induction: Intravenous  PONV Risk Score and Plan: 2 and Propofol infusion and Treatment may vary due to age or medical condition  Airway Management Planned: Nasal Cannula  Additional Equipment: None  Intra-op Plan:   Post-operative Plan:   Informed Consent: I have reviewed the patients History and Physical, chart, labs and discussed the procedure including the risks, benefits and alternatives for the proposed anesthesia with the patient or authorized representative who has indicated his/her understanding and acceptance.     Dental advisory given  Plan Discussed with: CRNA and Surgeon  Anesthesia Plan Comments: (See PAT note written 04/15/2020 by Myra Gianotti, PA-C. )       Anesthesia Quick Evaluation

## 2020-04-15 NOTE — Progress Notes (Addendum)
Anesthesia Chart Review: Belinda [name]   Case: [id number] Date/Time: 04/16/20 0951   Procedure: [place] TRANSPOSITION SECOND STAGE RIGHT (Right )   Anesthesia type: Choice   Pre-op diagnosis: ckd   Location: [facility] OR ROOM 11 / [place] [facility]   Surgeons: Waynetta Sandy, MD      DISCUSSION: Patient is a 69 year old female scheduled for the above procedure. She started hemodialysis ~ May 2021 via right IJ tunneled HD catheter. S/p first stage right basilic vein transposition on 02/13/20.    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   Patient last evaluated by Dr. Haroldine Laws on 03/14/20. He noted the etiology of chronic systolic CHF unclear. Echo had suggested cardiac amyloid but work-up negative. Myoview in 01/2018 was notable for mild to moderate anterior ischemia, cannot exclude breast attenuation, but LHC deferred at that time given advanced kidney disease. ICD not pursued given poor functional capacity and impeding hemodialysis. 05/2018 RHC showed moderate pulmonary hypertension, he suspected multifactorial. She did not qualify for selective pulmonary artery vasodilators. Sleep study ordered earlier this year, but not yet done. Since she was now on hemodialysis, he discussed whether or not to proceed with cardiac cath to exclude critical CAD as cause of her cardiomyopathy; "However given lack of angina and fact that she is not a CABG candidate doubt this would change management much and she has opted to  defer. Will reconsider if she has anginal symptoms." He wants her to call in March 2022 to schedule echocardiogram and follow-up visit.   04/14/2020 presurgical COVID-19 test negative.  She is a same-day work-up, so labs and anesthesia team to evaluate on the day of surgery.   VS: Ht 5\' 1"  (1.549 m)    Wt 57.6 kg    BMI 24.00 kg/m   BP Readings from Last 3 Encounters:  03/24/20 129/70  03/14/20 (!) 126/58  02/13/20 (!) 145/71   Pulse Readings from Last 3 Encounters:  03/24/20 96  03/14/20 (!) 106  02/13/20 95     PROVIDERS: Reynold Bowen, MD is PCP  Glori Bickers, MD is HF cardiologist. She had previously been followed by Pixie Casino, MD (2019) and Mertie Moores, MD (2018) prior to getting established with [facility]. Madelon Lips, MD is nephrologist   LABS: She is for labs on the day of surgery.As of 02/13/20, Cr 4.70, H/H 11.9/35.0, glucose 90.     PFTs 01/23/19: FVC 1.58 (74%), post 1.40 (65%). FEV1 1.28 (77%), post 1.17 (71%). DLCO unc 9.42 (53%), cor 11.87 (66%).Restriction of excelled volume. Patient reported extremely short of breath, unable to perform plethysmography for direct measurement of lung volumes. Insignificant response to bronchodilator. Diffusion mildly/moderately reduced.   IMAGES: MRI C-spine 09/01/18 (ordered by Zonia Kief, MD): IMPRESSION: 1. Severe C2-3 spinal canal stenosis with compression of the spinal cord, new compared to the 2015 study, secondary to large posterior osteophyte. Mild hyperintense T2-weighted signal within the spinal cord. 2. Status post C3-4 and C5-7 ACDF. Mild spinal canal stenosis persists at C4-5. 3. Multilevel moderate-to-severe neural foraminal stenosis, worst at C2-3.   EKG:02/13/20:  Normal sinus rhythm Left anterior fascicular block Minimal voltage  criteria for LVH, may be normal variant ( Cornell product ) Septal infarct , age undetermined Cannot rule out Inferior infarct (masked by  fascicular block?) , age undetermined Prolonged QT [QT 424, QTc 510 ms] Abnormal ECG No significant change since last tracing Confirmed by Kathlyn [place] [phone]) on 02/14/2020 8:50:47 AM   CV: Echo 08/23/18: IMPRESSIONS  1. The left ventricle has severely reduced systolic function, with an  ejection fraction of 25-30%. The cavity size was normal. Left ventricular  diastolic Doppler parameters are consistent with pseudonormalization.  Elevated mean left atrial pressure.  Left ventrical global hypokinesis without regional wall motion  abnormalities.  2. The right ventricle has normal systolic function. The cavity was  normal. There is no increase in right ventricular wall thickness. Right  ventricular systolic pressure is moderately elevated with an estimated  pressure of 59 mmHg.  3. Left atrial size was mildly dilated.  4. Small circumferential pericardial effusion.  5. Mild to moderate mitral valve regurgitation, jet centrally-directed.  6. The inferior vena cava was dilated in size with <50% respiratory  variability.  7. When compared to the prior study: There is little change since  07/15/2017.  (Comparison: LVEF 25-30% with diffuse hypokinesis 04/29/17; 45-50% 02/07/18; 25-30% 05/15/18)   RHC 06/23/18: Findings: RA = 4 RV = 57/8 PA = 57/26 (40) PCW = 15 Fick cardiac output/index = 5.0/3.2 Thermo CO/CI = 6.22/3.9 PVR = 5.0 WU Ao sat = 96% PA sat = 69%, 71% Assessment: 1. Mild to moderate PAH with normal PCWP and normal outputs Plan/Discussion: Continue medical therapy and w/u for PAH. Volume status looks good.  Glori Bickers, MD   Nuclear stress test 02/16/18: IMPRESSION: 1. Concern for mild to moderate reversibility involving the anterior wall. Concern for mild reversibility in the inferior wall. 2. Diffuse hypokinesia throughout the left ventricle. 3. Left ventricular ejection fraction is 30%. 4. Non invasive risk stratification*: High *2012  Appropriate Use Criteria for Coronary Revascularization Focused Update: J Am Coll Cardiol. 0626;94(8):546-270. http://content.airportbarriers.com.aspx?articleid=[email or web address] - By 08/01/19 cardiology note: "Myoview scanreviewed by Dr Haroldine Laws and notable for mild to moderate anterior ischemia - cannot exclude breast attenuation. Given lack of CP and creatinine ~3 will treat medically."    NM PYP Cardiac Amylodisois scan with SPECT 02/13/18: IMPRESSION: -Visual and quantitative assessment (grade 1, H/CLL equal 1.0 ) are equivocal for transthyretin amyloidosis. Consider follow-up imaging in 6-12 months or as clinically indicated. -Of note: A negative or mildly positive PYP does not exclude AL amyloid. In addition, equivocal results could represent AL amyloid or early TTR amyloid.   Past Medical History:  Diagnosis Date   Acute CHF (congestive heart failure) ([place]) 02/02/2018   Anemia    "get monthly injections to boost my HgB" (02/02/2018)   Anxiety    Asthma    Back pain    intractable low back   Chronic neck pain    CKD (chronic kidney disease), stage III (HCC)    on dialysis [facility] [employer]   Dysrhythmia    palpitations or heart racing periodic. has had for years- Dr Mare Ferrari follows.   GERD (gastroesophageal reflux disease)    History of blood transfusion 2016   After knee replacemnet   Hx MRSA infection    Hyperlipidemia    Hypertension    Migraines    "~ 2/month" (02/02/2018)   Osteoarthritis    "knees, neck" (02/02/2018)   Palpitations    Pneumonia    "twice in 2018" (02/02/2018)   Pulmonary artery hypertension ([place])  Seasonal allergies    Type II diabetes mellitus (HCC)    insulin dependent - fasting 140-200     Past Surgical History:  Procedure Laterality Date   ABDOMINAL HYSTERECTOMY  03/2001   Abdominal supracervical hysterectomy, left salpingo-oophorectomy   ANTERIOR CERVICAL DECOMP/DISCECTOMY FUSION N/A 01/01/2014   Procedure:  CERVICAL THREE-FOUR,CERVICAL FIVE-SIX,CERVICAL SIX-SEVEN ANTERIOR CERVICAL DECOMPRESSION/DISCECTOMY/FUSION;  Surgeon: Kristeen Miss, MD;  Location: [facility];  Service: Neurosurgery;  Laterality: N/A;  left-side approach   AV FISTULA PLACEMENT Right 02/13/2020   Procedure: ARTERIOVENOUS (AV) FISTULA CREATION;  Surgeon: Waynetta Sandy, MD;  Location: [facility];  Service: Vascular;  Laterality: Right;   BACK SURGERY     CATARACT EXTRACTION W/ INTRAOCULAR LENS  IMPLANT, BILATERAL Bilateral 2012   CESAREAN SECTION     DILATION AND CURETTAGE OF UTERUS  X 2   HAND SURGERY Left 1970s   laceration repair 4th and 5th digits   JOINT REPLACEMENT     KNEE ARTHROSCOPY Left    torn mensicus   NASAL SINUS SURGERY     RIGHT HEART CATH N/A 06/23/2018   Procedure: RIGHT HEART CATH;  Surgeon: Jolaine Artist, MD;  Location: [facility];  Service: Cardiovascular;  Laterality: N/A;   TOTAL KNEE ARTHROPLASTY Left 02/12/2015   Procedure: TOTAL KNEE ARTHROPLASTY;  Surgeon: [name], MD;  Location: [facility];  Service: Orthopedics;  Laterality: Left;    MEDICATIONS: No current facility-administered medications for this encounter.    acetaminophen (TYLENOL) 650 MG CR tablet   Alpha-D-Galactosidase (BEANO PO)   aspirin EC 81 MG tablet   Azelastine HCl 0.15 % SOLN   budesonide-formoterol (SYMBICORT) 160-4.5 MCG/ACT inhaler   carvedilol (COREG) 3.125 MG tablet   diclofenac sodium (VOLTAREN) 1 % GEL   ezetimibe-simvastatin (VYTORIN) 10-40 MG per tablet   ferric citrate (AURYXIA) 1 GM 210 MG(Fe) tablet   fexofenadine (ALLEGRA) 180 MG tablet   guaiFENesin (MUCINEX) 600 MG 12 hr tablet   insulin lispro (HUMALOG) 100 UNIT/ML injection   ketotifen (ALAWAY) 0.025 % ophthalmic solution   LANTUS SOLOSTAR 100 UNIT/ML Solostar Pen   multivitamin (RENA-VIT) TABS tablet   nitroGLYCERIN (NITROSTAT) 0.4 MG SL tablet   simethicone (GAS-X) 80 MG chewable tablet   Methoxy  PEG-Epoetin Beta (MIRCERA IJ)     Myra Gianotti, PA-C Surgical Short Stay/Anesthesiology [facility] Phone [phone] [facility] Phone [phone] 04/15/2020 10:37 AM

## 2020-04-15 NOTE — Telephone Encounter (Signed)
Spoke with pt and updated on arrival time of 0630 AM for procedure at [facility] on tomorrow. Pt voiced understanding.

## 2020-04-16 ENCOUNTER — Encounter (HOSPITAL_COMMUNITY): Payer: Self-pay | Admitting: Vascular Surgery

## 2020-04-16 ENCOUNTER — Ambulatory Visit (HOSPITAL_COMMUNITY): Payer: Medicare Other | Admitting: Vascular Surgery

## 2020-04-16 ENCOUNTER — Ambulatory Visit (HOSPITAL_COMMUNITY)
Admission: RE | Admit: 2020-04-16 | Discharge: 2020-04-16 | Disposition: A | Discharge status: Home / Self Care | Payer: Medicare Other | Attending: Vascular Surgery | Admitting: Vascular Surgery

## 2020-04-16 ENCOUNTER — Encounter (HOSPITAL_COMMUNITY)
Admission: RE | Disposition: A | Discharge status: Home / Self Care | Payer: Self-pay | Source: Home / Self Care | Attending: Vascular Surgery

## 2020-04-16 ENCOUNTER — Other Ambulatory Visit: Payer: Self-pay

## 2020-04-16 DIAGNOSIS — Z886 Allergy status to analgesic agent status: Secondary | ICD-10-CM | POA: Diagnosis not present

## 2020-04-16 DIAGNOSIS — Z881 Allergy status to other antibiotic agents status: Secondary | ICD-10-CM | POA: Diagnosis not present

## 2020-04-16 DIAGNOSIS — Z888 Allergy status to other drugs, medicaments and biological substances status: Secondary | ICD-10-CM | POA: Diagnosis not present

## 2020-04-16 DIAGNOSIS — N186 End stage renal disease: Secondary | ICD-10-CM | POA: Diagnosis present

## 2020-04-16 DIAGNOSIS — Z992 Dependence on renal dialysis: Secondary | ICD-10-CM | POA: Insufficient documentation

## 2020-04-16 DIAGNOSIS — N185 Chronic kidney disease, stage 5: Secondary | ICD-10-CM

## 2020-04-16 HISTORY — PX: BASCILIC VEIN TRANSPOSITION: SHX5742

## 2020-04-16 LAB — POCT I-STAT, CHEM 8
BUN: 39 mg/dL — ABNORMAL HIGH (ref 8–23)
Calcium, Ion: 1.12 mmol/L — ABNORMAL LOW (ref 1.15–1.40)
Chloride: 94 mmol/L — ABNORMAL LOW (ref 98–111)
Creatinine, Ser: 5.9 mg/dL — ABNORMAL HIGH (ref 0.44–1.00)
Glucose, Bld: 182 mg/dL — ABNORMAL HIGH (ref 70–99)
HCT: 41 % (ref 36.0–46.0)
Hemoglobin: 13.9 g/dL (ref 12.0–15.0)
Potassium: 4.1 mmol/L (ref 3.5–5.1)
Sodium: 137 mmol/L (ref 135–145)
TCO2: 31 mmol/L (ref 22–32)

## 2020-04-16 LAB — GLUCOSE, CAPILLARY
Glucose-Capillary: 152 mg/dL — ABNORMAL HIGH (ref 70–99)
Glucose-Capillary: 204 mg/dL — ABNORMAL HIGH (ref 70–99)

## 2020-04-16 SURGERY — TRANSPOSITION, VEIN, BASILIC
Anesthesia: Monitor Anesthesia Care | Site: Arm Upper | Laterality: Right

## 2020-04-16 MED ORDER — SODIUM CHLORIDE 0.9 % IV SOLN
INTRAVENOUS | Status: DC | PRN
  Administered 2020-04-16: 500 mL

## 2020-04-16 MED ORDER — ACETAMINOPHEN 500 MG PO TABS: 1000.0000 mg | ORAL_TABLET | Freq: Once | ORAL | Status: DC | PRN

## 2020-04-16 MED ORDER — SODIUM CHLORIDE 0.9 % IV SOLN
INTRAVENOUS | Status: AC
  Filled 2020-04-16: qty 1.2

## 2020-04-16 MED ORDER — MEPIVACAINE HCL (PF) 2 % IJ SOLN
INTRAMUSCULAR | Status: DC | PRN
  Administered 2020-04-16: 15 mL

## 2020-04-16 MED ORDER — PROPOFOL 500 MG/50ML IV EMUL
INTRAVENOUS | Status: DC | PRN
  Administered 2020-04-16: 25 ug/kg/min via INTRAVENOUS

## 2020-04-16 MED ORDER — CHLORHEXIDINE GLUCONATE 4 % EX LIQD: 60.0000 mL | Freq: Once | CUTANEOUS | Status: DC

## 2020-04-16 MED ORDER — PHENYLEPHRINE 40 MCG/ML (10ML) SYRINGE FOR IV PUSH (FOR BLOOD PRESSURE SUPPORT)
PREFILLED_SYRINGE | INTRAVENOUS | Status: DC | PRN
  Administered 2020-04-16: 120 ug via INTRAVENOUS
  Administered 2020-04-16 (×3): 40 ug via INTRAVENOUS

## 2020-04-16 MED ORDER — HYDROCODONE-ACETAMINOPHEN 5-325 MG PO TABS: 1.0000 | ORAL_TABLET | Freq: Four times a day (QID) | ORAL | 0 refills | Status: DC | PRN

## 2020-04-16 MED ORDER — ACETAMINOPHEN 10 MG/ML IV SOLN: 1000.0000 mg | Freq: Once | INTRAVENOUS | Status: DC | PRN

## 2020-04-16 MED ORDER — FENTANYL CITRATE (PF) 100 MCG/2ML IJ SOLN
INTRAMUSCULAR | Status: DC | PRN
  Administered 2020-04-16: 50 ug via INTRAVENOUS
  Administered 2020-04-16: 25 ug via INTRAVENOUS

## 2020-04-16 MED ORDER — FENTANYL CITRATE (PF) 100 MCG/2ML IJ SOLN: 25.0000 ug | INTRAMUSCULAR | Status: DC | PRN

## 2020-04-16 MED ORDER — LIDOCAINE-EPINEPHRINE (PF) 1.5 %-1:200000 IJ SOLN
INTRAMUSCULAR | Status: DC | PRN
  Administered 2020-04-16: 5 mL via PERINEURAL

## 2020-04-16 MED ORDER — VANCOMYCIN HCL IN DEXTROSE 1-5 GM/200ML-% IV SOLN
1000.0000 mg | INTRAVENOUS | Status: AC
  Administered 2020-04-16: 1000 mg via INTRAVENOUS
  Filled 2020-04-16: qty 200

## 2020-04-16 MED ORDER — ORAL CARE MOUTH RINSE: 15.0000 mL | Freq: Once | OROMUCOSAL | Status: AC

## 2020-04-16 MED ORDER — FENTANYL CITRATE (PF) 250 MCG/5ML IJ SOLN
INTRAMUSCULAR | Status: AC
  Filled 2020-04-16: qty 5

## 2020-04-16 MED ORDER — PROPOFOL 10 MG/ML IV BOLUS
INTRAVENOUS | Status: AC
  Filled 2020-04-16: qty 20

## 2020-04-16 MED ORDER — SODIUM CHLORIDE 0.9 % IV SOLN: INTRAVENOUS | Status: DC

## 2020-04-16 MED ORDER — CHLORHEXIDINE GLUCONATE 0.12 % MT SOLN: 15.0000 mL | Freq: Once | OROMUCOSAL | Status: AC

## 2020-04-16 MED ORDER — MIDAZOLAM HCL 5 MG/5ML IJ SOLN
INTRAMUSCULAR | Status: DC | PRN
  Administered 2020-04-16 (×2): .5 mg via INTRAVENOUS

## 2020-04-16 MED ORDER — LIDOCAINE-EPINEPHRINE (PF) 1 %-1:200000 IJ SOLN
INTRAMUSCULAR | Status: DC | PRN
  Administered 2020-04-16: 30 mL

## 2020-04-16 MED ORDER — OXYCODONE HCL 5 MG PO TABS
ORAL_TABLET | ORAL | Status: AC
  Filled 2020-04-16: qty 1

## 2020-04-16 MED ORDER — LIDOCAINE-EPINEPHRINE (PF) 1 %-1:200000 IJ SOLN
INTRAMUSCULAR | Status: AC
  Filled 2020-04-16: qty 30

## 2020-04-16 MED ORDER — ACETAMINOPHEN 160 MG/5ML PO SOLN: 1000.0000 mg | Freq: Once | ORAL | Status: DC | PRN

## 2020-04-16 MED ORDER — CHLORHEXIDINE GLUCONATE 0.12 % MT SOLN
OROMUCOSAL | Status: AC
  Administered 2020-04-16: 15 mL via OROMUCOSAL
  Filled 2020-04-16: qty 15

## 2020-04-16 MED ORDER — 0.9 % SODIUM CHLORIDE (POUR BTL) OPTIME
TOPICAL | Status: DC | PRN
  Administered 2020-04-16: 1000 mL

## 2020-04-16 MED ORDER — OXYCODONE HCL 5 MG/5ML PO SOLN: 5.0000 mg | Freq: Once | ORAL | Status: AC | PRN

## 2020-04-16 MED ORDER — OXYCODONE HCL 5 MG PO TABS
5.0000 mg | ORAL_TABLET | Freq: Once | ORAL | Status: AC | PRN
  Administered 2020-04-16: 5 mg via ORAL

## 2020-04-16 MED ORDER — MIDAZOLAM HCL 2 MG/2ML IJ SOLN
INTRAMUSCULAR | Status: AC
  Filled 2020-04-16: qty 2

## 2020-04-16 SURGICAL SUPPLY — 32 items
ARMBAND PINK RESTRICT EXTREMIT (MISCELLANEOUS) ×2 IMPLANT
CANISTER SUCT 3000ML PPV (MISCELLANEOUS) ×2 IMPLANT
CLIP VESOCCLUDE MED 24/CT (CLIP) ×2 IMPLANT
CLIP VESOCCLUDE MED 6/CT (CLIP) IMPLANT
CLIP VESOCCLUDE SM WIDE 24/CT (CLIP) ×2 IMPLANT
CLIP VESOCCLUDE SM WIDE 6/CT (CLIP) IMPLANT
COVER PROBE W GEL 5X96 (DRAPES) ×2 IMPLANT
COVER WAND RF STERILE (DRAPES) IMPLANT
DERMABOND ADVANCED (GAUZE/BANDAGES/DRESSINGS) ×1
DERMABOND ADVANCED .7 DNX12 (GAUZE/BANDAGES/DRESSINGS) ×1 IMPLANT
ELECT REM PT RETURN 9FT ADLT (ELECTROSURGICAL) ×2
ELECTRODE REM PT RTRN 9FT ADLT (ELECTROSURGICAL) ×1 IMPLANT
GLOVE BIO SURGEON STRL SZ7.5 (GLOVE) ×2 IMPLANT
GOWN STRL REUS W/ TWL LRG LVL3 (GOWN DISPOSABLE) ×2 IMPLANT
GOWN STRL REUS W/ TWL XL LVL3 (GOWN DISPOSABLE) ×1 IMPLANT
GOWN STRL REUS W/TWL LRG LVL3 (GOWN DISPOSABLE) ×4
GOWN STRL REUS W/TWL XL LVL3 (GOWN DISPOSABLE) ×2
KIT BASIN OR (CUSTOM PROCEDURE TRAY) ×2 IMPLANT
KIT TURNOVER KIT B (KITS) ×2 IMPLANT
NS IRRIG 1000ML POUR BTL (IV SOLUTION) ×2 IMPLANT
PACK CV ACCESS (CUSTOM PROCEDURE TRAY) ×2 IMPLANT
PAD ARMBOARD 7.5X6 YLW CONV (MISCELLANEOUS) ×4 IMPLANT
SUT MNCRL AB 4-0 PS2 18 (SUTURE) ×4 IMPLANT
SUT PROLENE 6 0 BV (SUTURE) ×2 IMPLANT
SUT SILK 2 0 SH (SUTURE) ×2 IMPLANT
SUT SILK 3 0 (SUTURE) ×2
SUT SILK 3-0 18XBRD TIE 12 (SUTURE) ×1 IMPLANT
SUT VIC AB 3-0 SH 27 (SUTURE) ×4
SUT VIC AB 3-0 SH 27X BRD (SUTURE) ×2 IMPLANT
TOWEL GREEN STERILE (TOWEL DISPOSABLE) ×2 IMPLANT
UNDERPAD 30X36 HEAVY ABSORB (UNDERPADS AND DIAPERS) ×2 IMPLANT
WATER STERILE IRR 1000ML POUR (IV SOLUTION) ×2 IMPLANT

## 2020-04-16 NOTE — Transfer of Care (Signed)
Immediate Anesthesia Transfer of Care Note  Patient: Belinda [name]  Procedure(s) Performed: [place] STAGE RIGHT (Right Arm Upper)  Patient Location: PACU  Anesthesia Type:MAC  Level of Consciousness: awake, alert  and oriented  Airway & Oxygen Therapy: Patient Spontanous Breathing and Patient connected to nasal cannula oxygen  Post-op Assessment: Report given to RN, Post -op Vital signs reviewed and stable and Patient moving all extremities  Post vital signs: Reviewed and stable  Last Vitals:  Vitals Value Taken Time  BP 149/80 04/16/20 1047  Temp 36.6 C 04/16/20 1047  Pulse 84 04/16/20 1053  Resp 17 04/16/20 1053  SpO2 99 % 04/16/20 1053  Vitals shown include unvalidated device data.  Last Pain:  Vitals:   04/16/20 1100  TempSrc:   PainSc: 0-No pain      Patients Stated Pain Goal: 4 (97/74/14 2395)  Complications: No complications documented.

## 2020-04-16 NOTE — Interval H&P Note (Signed)
History and Physical Interval Note:  04/16/2020 8:28 AM  Belinda [name]  has presented today for surgery, with the diagnosis of ckd.  The various methods of treatment have been discussed with the patient and family. After consideration of risks, benefits and other options for treatment, the patient has consented to  Procedure(s): [place] (Right) as a surgical intervention.  The patient's history has been reviewed, patient examined, no change in status, stable for surgery.  I have reviewed the patient's chart and labs.  Questions were answered to the patient's satisfaction.     Servando Snare

## 2020-04-16 NOTE — Op Note (Signed)
    Patient name: Belinda [name] MRN: [id number] DOB: 08-13-1950 Sex: female  04/16/2020 Pre-operative Diagnosis: esrd Post-operative diagnosis:  Same Surgeon:  Erlene Quan C. Donzetta Matters, MD Assistant: Arlee Muslim, PA Procedure Performed: Revision of right basilic vein fistula with transposition  Indications: 69 year old female on dialysis via right IJ catheter.  She has a previously placed right brachial artery to basilic vein fistula she has now indicated for transposition.  An assistant was necessary for suction, retraction, assistance with anastomosis and wound closure  Findings: The basilic vein was large had a very strong thrill near the antecubitum.  At about the mid upper arm the stove to the deep system.  We transposed to the proximal arm aspect of the basilic and the more upper arm portion of the brachial vein.  At completion was a very strong thrill and a palpable radial artery pulse these were both confirmed with Doppler.   Procedure:  The patient was identified in the holding area and taken to the operating room where she was placed supine on the operative table.  Preoperative block had been placed.  Timeout was called antibiotics were administered.  The block was checked was noted to be good.  We opened her previous incision dissected down the vein.  We identified the superficial nerves these were protected.  We made 2 further counterincisions towards the axilla.  We protected the nerve and these 2 incisions as well.  Unfortunate the mid upper arm the vein did get into the deep system.  We ligated the basilic vein proper.  We ligated multiple branches between clips and ties.  We then had exposed the entire vein was marked for orientation.  We clamped it near the previous anastomosis and transected it.  We flushed with heparinized saline clamped it reflushed there was no leaking.  We tunneled it had to be tunneled on the medial aspect of the upper arm.  We then spatulated both ends reflushed in  both directions.  We sewed end-to-end with 6-0 Prolene suture.  Prior to completion we allowed flushing all directions.  Upon completion there was a very strong thrill in the vein and there was a palpable radial artery pulse the wrist.  Satisfied we irrigated wounds of hemostasis closed in layers with Vicryl Monocryl.  She was awakened from anesthesia having tolerated procedure without any complication but all counts were correct at completion.  EBL: 50 cc   Uriyah Raska C. Donzetta Matters, MD [facility] Office: [phone] Pager: [phone]

## 2020-04-16 NOTE — Progress Notes (Signed)
Orthopedic Tech Progress Note Patient Details:  Belinda [name] October 15, 1950 [id number] PACU RN called requesting a ARM SLING Ortho Devices Type of Ortho Device: Arm sling Ortho Device/Splint Location: RUE Ortho Device/Splint Interventions: Application, Adjustment   Post Interventions Patient Tolerated: Well Instructions Provided: Care of device   Janit Pagan 04/16/2020, 11:25 AM

## 2020-04-16 NOTE — Anesthesia Procedure Notes (Signed)
Procedure Name: MAC Date/Time: 04/16/2020 8:40 AM Performed by: Leonor Liv, CRNA Pre-anesthesia Checklist: Patient identified, Emergency Drugs available, Suction available, Patient being monitored and Timeout performed Patient Re-evaluated:Patient Re-evaluated prior to induction Oxygen Delivery Method: Nasal cannula Placement Confirmation: positive ETCO2 Dental Injury: Teeth and Oropharynx as per pre-operative assessment

## 2020-04-16 NOTE — Anesthesia Procedure Notes (Signed)
Anesthesia Regional Block: Supraclavicular block   Pre-Anesthetic Checklist: ,, timeout performed, Correct Patient, Correct Site, Correct Laterality, Correct Procedure, Correct Position, site marked, Risks and benefits discussed,  Surgical consent,  Pre-op evaluation,  At surgeon's request and post-op pain management  Laterality: Right and Upper  Prep: chloraprep       Needles:  Injection technique: Single-shot     Needle Length: 5cm  Needle Gauge: 22     Additional Needles: Arrow StimuQuik ECHO Echogenic Stimulating PNB Needle  Procedures:,,,, ultrasound used (permanent image in chart),,,,  Narrative:  Start time: 04/16/2020 8:24 AM End time: 04/16/2020 8:29 AM Injection made incrementally with aspirations every 5 mL.  Performed by: Personally  Anesthesiologist: Oleta Mouse, MD

## 2020-04-16 NOTE — Discharge Instructions (Signed)
° °  [facility] ° °Discharge Instructions ° °AV Fistula or Graft Surgery for Dialysis Access ° °Please refer to the following instructions for your post-procedure care. Your surgeon or physician assistant will discuss any changes with you. ° °Activity ° °You may drive the day following your surgery, if you are comfortable and no longer taking prescription pain medication. Resume full activity as the soreness in your incision resolves. ° °Bathing/Showering ° °You may shower after you go home. Keep your incision dry for 48 hours. Do not soak in a bathtub, hot tub, or swim until the incision heals completely. You may not shower if you have a hemodialysis catheter. ° °Incision Care ° °Clean your incision with mild soap and water after 48 hours. Pat the area dry with a clean towel. You do not need a bandage unless otherwise instructed. Do not apply any ointments or creams to your incision. You may have skin glue on your incision. Do not peel it off. It will come off on its own in about one week. Your arm may swell a bit after surgery. To reduce swelling use pillows to elevate your arm so it is above your heart. Your doctor will tell you if you need to lightly wrap your arm with an ACE bandage. ° °Diet ° °Resume your normal diet. There are not special food restrictions following this procedure. In order to heal from your surgery, it is CRITICAL to get adequate nutrition. Your body requires vitamins, minerals, and protein. Vegetables are the best source of vitamins and minerals. Vegetables also provide the perfect balance of protein. Processed food has little nutritional value, so try to avoid this. ° °Medications ° °Resume taking all of your medications. If your incision is causing pain, you may take over-the counter pain relievers such as acetaminophen (Tylenol). If you were prescribed a stronger pain medication, please be aware these medications can cause nausea and constipation. Prevent  nausea by taking the medication with a snack or meal. Avoid constipation by drinking plenty of fluids and eating foods with high amount of fiber, such as fruits, vegetables, and grains. Do not take Tylenol if you are taking prescription pain medications. ° ° ° ° °Follow up °Your surgeon may want to see you in the office following your access surgery. If so, this will be arranged at the time of your surgery. ° °Please call us immediately for any of the following conditions: ° °Increased pain, redness, drainage (pus) from your incision site °Fever of 101 degrees or higher °Severe or worsening pain at your incision site °Hand pain or numbness. ° °Reduce your risk of vascular disease: ° °Stop smoking. If you would like help, call [facility] at 1-800-QUIT-NOW (1-800-784-8669) or [place] at 336-586-4000 ° °Manage your cholesterol °Maintain a desired weight °Control your diabetes °Keep your blood pressure down ° °Dialysis ° °It will take several weeks to several months for your new dialysis access to be ready for use. Your surgeon will determine when it is OK to use it. Your nephrologist will continue to direct your dialysis. You can continue to use your Permcath until your new access is ready for use. ° °If you have any questions, please call the office at [phone]. ° °

## 2020-04-17 ENCOUNTER — Encounter (HOSPITAL_COMMUNITY): Payer: Self-pay | Admitting: Vascular Surgery

## 2020-04-23 ENCOUNTER — Encounter (HOSPITAL_COMMUNITY): Payer: Self-pay | Admitting: Vascular Surgery

## 2020-04-23 NOTE — Anesthesia Postprocedure Evaluation (Signed)
Anesthesia Post Note  Patient: Belinda [name]  Procedure(s) Performed: Star STAGE RIGHT (Right Arm Upper)     Patient location during evaluation: PACU Anesthesia Type: Regional and MAC Level of consciousness: awake and alert Pain management: pain level controlled Vital Signs Assessment: post-procedure vital signs reviewed and stable Respiratory status: spontaneous breathing, nonlabored ventilation, respiratory function stable and patient connected to nasal cannula oxygen Cardiovascular status: stable and blood pressure returned to baseline Postop Assessment: no apparent nausea or vomiting Anesthetic complications: no   No complications documented.  Last Vitals:  Vitals:   04/16/20 1032 04/16/20 1047  BP: (!) 144/78 (!) 149/80  Pulse: 86 84  Resp: 18 20  Temp: (!) 36.4 C 36.6 C  SpO2: 99% 98%    Last Pain:  Vitals:   04/16/20 1100  TempSrc:   PainSc: 0-No pain                 Wissam Resor

## 2020-04-24 ENCOUNTER — Other Ambulatory Visit: Payer: Self-pay

## 2020-04-24 DIAGNOSIS — N186 End stage renal disease: Secondary | ICD-10-CM

## 2020-04-30 ENCOUNTER — Ambulatory Visit (HOSPITAL_COMMUNITY)
Admission: RE | Admit: 2020-04-30 | Discharge: 2020-04-30 | Disposition: A | Discharge status: Home / Self Care | Payer: Medicare Other | Source: Ambulatory Visit | Attending: Physician Assistant | Admitting: Physician Assistant

## 2020-04-30 ENCOUNTER — Encounter: Payer: Self-pay | Admitting: Physician Assistant

## 2020-04-30 ENCOUNTER — Other Ambulatory Visit: Payer: Self-pay

## 2020-04-30 ENCOUNTER — Ambulatory Visit (INDEPENDENT_AMBULATORY_CARE_PROVIDER_SITE_OTHER): Payer: Self-pay | Admitting: Physician Assistant

## 2020-04-30 VITALS — BP 146/80 | HR 89 | Temp 98.0°F | Resp 20 | Ht 61.0 in | Wt 135.0 lb

## 2020-04-30 DIAGNOSIS — N186 End stage renal disease: Secondary | ICD-10-CM | POA: Diagnosis present

## 2020-04-30 NOTE — Progress Notes (Signed)
POST OPERATIVE OFFICE NOTE    CC:  F/u for surgery  HPI:  69 year old female on dialysis via right IJ catheter.  She had a second stage basilic fistula transposition by Dr. Donzetta Matters on 04/16/20.  The fistula was originally created 02/13/20.    Pt returns today for follow up with steal symptoms.  She is unable to make a fist, and has both pain and numbness.  She can't rest her arm on anything without have increased pain.      Allergies  Allergen Reactions  . Entresto [Sacubitril-Valsartan] Itching  . Omnicef [Cefdinir] Other (See Comments)    GI Upset  . Aspirin Other (See Comments)    GI issues, Can tolerate low aspirin  . Augmentin [Amoxicillin-Pot Clavulanate] Nausea And Vomiting and Other (See Comments)    GI issues DID THE REACTION INVOLVE: Swelling of the face/tongue/throat, SOB, or low BP? No Sudden or severe rash/hives, skin peeling, or the inside of the mouth or nose? No Did it require medical treatment? No When did it last happen?long time  If all above answers are "NO", may proceed with cephalosporin use.   . Erythromycin Nausea And Vomiting and Other (See Comments)    Gi issues  . Gabapentin Swelling  . Nickel Itching and Rash    Breakouts     Current Outpatient Medications  Medication Sig Dispense Refill  . acetaminophen (TYLENOL) 650 MG CR tablet Take 650-1,300 mg by mouth every 8 (eight) hours as needed for pain (pain).     . Alpha-D-Galactosidase (BEANO PO) Take 1-2 tablets by mouth daily as needed (gas). With certain foods.    [name] aspirin EC 81 MG tablet Take 81 mg by mouth 3 (three) times a week. Swallow whole.    . Azelastine HCl 0.15 % SOLN Place 2 sprays into both nostrils daily.     . budesonide-formoterol (SYMBICORT) 160-4.5 MCG/ACT inhaler Inhale 2 puffs into the lungs 2 (two) times daily as needed (cough/respiratory issues.).     [name] carvedilol (COREG) 3.125 MG tablet Take 3.125 mg by mouth 2 (two) times daily.    . diclofenac sodium (VOLTAREN) 1 % GEL  Apply 1 application topically 4 (four) times daily as needed (neck pain).     [name] ezetimibe-simvastatin (VYTORIN) 10-40 MG per tablet Take 1 tablet by mouth at bedtime.     . ferric citrate (AURYXIA) 1 GM 210 MG(Fe) tablet Take 210-420 mg by mouth See admin instructions. Take 3 tablets (630 mg) by mouth 3 times daily with meals & take 1 tablet (210 mg) by mouth with each snack    . fexofenadine (ALLEGRA) 180 MG tablet Take 180 mg by mouth daily.    [name] guaiFENesin (MUCINEX) 600 MG 12 hr tablet Take 600 mg by mouth in the morning and at bedtime.     [name] HYDROcodone-acetaminophen (NORCO) 5-325 MG tablet Take 1 tablet by mouth every 6 (six) hours as needed for moderate pain. 15 tablet 0  . insulin lispro (HUMALOG) 100 UNIT/ML injection Inject 4-10 Units into the skin 3 (three) times daily before meals. Sliding Scale Insulin    . ketotifen (ALAWAY) 0.025 % ophthalmic solution Place 1 drop into both eyes 2 (two) times daily as needed (allergy/dry/irritated eyes.).    [name] LANTUS SOLOSTAR 100 UNIT/ML Solostar Pen Inject 18 Units into the skin at bedtime.   3  . Methoxy PEG-Epoetin Beta (MIRCERA IJ) Mircera    . multivitamin (RENA-VIT) TABS tablet Take 1 tablet by mouth daily with breakfast.     .  nitroGLYCERIN (NITROSTAT) 0.4 MG SL tablet Place 1 tablet (0.4 mg total) under the tongue every 5 (five) minutes x 3 doses as needed for chest pain. 6 tablet 1  . simethicone (GAS-X) 80 MG chewable tablet Chew 80-160 mg by mouth daily as needed for flatulence (indigestion.).      No current facility-administered medications for this visit.     ROS:  See HPI  Physical Exam:  Findings:    +------------------+-----------------+-------------------+--------+           PPG w compressionPPG w/o compressionComments  +------------------+-----------------+-------------------+--------+           PSV w compressionPSV w/o compression        +------------------+-----------------+-------------------+--------+  Right Ulnar Artery    180        156          +------------------+-----------------+-------------------+--------+     Summary:  Ulnar pressure under ambient conditions 150 mmHG. Slight increase with  compression to 180 mmHG..   Incision:  Well healed Extremities:  Weakness with grip 4-/5, decreased sensation with cool fingers to touch.  Palpable thrill in fistula and radial pulse on the right UE.  Right TDC intact. Lungs: non labored breathing   Assessment/Plan:  This is a 69 y.o. female who is s/p: second stage basilic right UE with steal symptoms that are not tolerable.    We have scheduled her for right UE fistula ligation.  I reviewed the left UE vein mapping which shows an acceptable basilic, but she is hesitant to commit to left AV fistula creation.  Once we have the ligation behind [place] her and Dr. Donzetta Matters can discuss the next access plan.    Roxy Horseman PA-C [facility] [phone]  Clinic MD:  [facility]

## 2020-04-30 NOTE — H&P (View-Only) (Signed)
POST OPERATIVE OFFICE NOTE    CC:  F/u for surgery  HPI:  69 year old female on dialysis via right IJ catheter.  She had a second stage basilic fistula transposition by Dr. Donzetta Matters on 04/16/20.  The fistula was originally created 02/13/20.    Pt returns today for follow up with steal symptoms.  She is unable to make a fist, and has both pain and numbness.  She can't rest her arm on anything without have increased pain.      Allergies  Allergen Reactions  . Entresto [Sacubitril-Valsartan] Itching  . Omnicef [Cefdinir] Other (See Comments)    GI Upset  . Aspirin Other (See Comments)    GI issues, Can tolerate low aspirin  . Augmentin [Amoxicillin-Pot Clavulanate] Nausea And Vomiting and Other (See Comments)    GI issues DID THE REACTION INVOLVE: Swelling of the face/tongue/throat, SOB, or low BP? No Sudden or severe rash/hives, skin peeling, or the inside of the mouth or nose? No Did it require medical treatment? No When did it last happen?long time  If all above answers are "NO", may proceed with cephalosporin use.   . Erythromycin Nausea And Vomiting and Other (See Comments)    Gi issues  . Gabapentin Swelling  . Nickel Itching and Rash    Breakouts     Current Outpatient Medications  Medication Sig Dispense Refill  . acetaminophen (TYLENOL) 650 MG CR tablet Take 650-1,300 mg by mouth every 8 (eight) hours as needed for pain (pain).     . Alpha-D-Galactosidase (BEANO PO) Take 1-2 tablets by mouth daily as needed (gas). With certain foods.    [name] aspirin EC 81 MG tablet Take 81 mg by mouth 3 (three) times a week. Swallow whole.    . Azelastine HCl 0.15 % SOLN Place 2 sprays into both nostrils daily.     . budesonide-formoterol (SYMBICORT) 160-4.5 MCG/ACT inhaler Inhale 2 puffs into the lungs 2 (two) times daily as needed (cough/respiratory issues.).     [name] carvedilol (COREG) 3.125 MG tablet Take 3.125 mg by mouth 2 (two) times daily.    . diclofenac sodium (VOLTAREN) 1 % GEL  Apply 1 application topically 4 (four) times daily as needed (neck pain).     [name] ezetimibe-simvastatin (VYTORIN) 10-40 MG per tablet Take 1 tablet by mouth at bedtime.     . ferric citrate (AURYXIA) 1 GM 210 MG(Fe) tablet Take 210-420 mg by mouth See admin instructions. Take 3 tablets (630 mg) by mouth 3 times daily with meals & take 1 tablet (210 mg) by mouth with each snack    . fexofenadine (ALLEGRA) 180 MG tablet Take 180 mg by mouth daily.    [name] guaiFENesin (MUCINEX) 600 MG 12 hr tablet Take 600 mg by mouth in the morning and at bedtime.     [name] HYDROcodone-acetaminophen (NORCO) 5-325 MG tablet Take 1 tablet by mouth every 6 (six) hours as needed for moderate pain. 15 tablet 0  . insulin lispro (HUMALOG) 100 UNIT/ML injection Inject 4-10 Units into the skin 3 (three) times daily before meals. Sliding Scale Insulin    . ketotifen (ALAWAY) 0.025 % ophthalmic solution Place 1 drop into both eyes 2 (two) times daily as needed (allergy/dry/irritated eyes.).    [name] LANTUS SOLOSTAR 100 UNIT/ML Solostar Pen Inject 18 Units into the skin at bedtime.   3  . Methoxy PEG-Epoetin Beta (MIRCERA IJ) Mircera    . multivitamin (RENA-VIT) TABS tablet Take 1 tablet by mouth daily with breakfast.     .  nitroGLYCERIN (NITROSTAT) 0.4 MG SL tablet Place 1 tablet (0.4 mg total) under the tongue every 5 (five) minutes x 3 doses as needed for chest pain. 6 tablet 1  . simethicone (GAS-X) 80 MG chewable tablet Chew 80-160 mg by mouth daily as needed for flatulence (indigestion.).      No current facility-administered medications for this visit.     ROS:  See HPI  Physical Exam:  Findings:    +------------------+-----------------+-------------------+--------+           PPG w compressionPPG w/o compressionComments  +------------------+-----------------+-------------------+--------+           PSV w compressionPSV w/o compression        +------------------+-----------------+-------------------+--------+  Right Ulnar Artery    180        156          +------------------+-----------------+-------------------+--------+     Summary:  Ulnar pressure under ambient conditions 150 mmHG. Slight increase with  compression to 180 mmHG..   Incision:  Well healed Extremities:  Weakness with grip 4-/5, decreased sensation with cool fingers to touch.  Palpable thrill in fistula and radial pulse on the right UE.  Right TDC intact. Lungs: non labored breathing   Assessment/Plan:  This is a 69 y.o. female who is s/p: second stage basilic right UE with steal symptoms that are not tolerable.    We have scheduled her for right UE fistula ligation.  I reviewed the left UE vein mapping which shows an acceptable basilic, but she is hesitant to commit to left AV fistula creation.  Once we have the ligation behind [place] her and Dr. Donzetta Matters can discuss the next access plan.    Roxy Horseman PA-C [facility] [phone]  Clinic MD:  [facility]

## 2020-05-06 ENCOUNTER — Encounter (HOSPITAL_COMMUNITY): Payer: Self-pay | Admitting: Vascular Surgery

## 2020-05-06 NOTE — Progress Notes (Signed)
Mrs Rehberg denies chest pain or shortness of breath. Patient will be tested for Covid on arrival.  Mrs. Sieling has type II diabetes. Patient reports CBGs run 180's, "they do drop in the night sometimes- I have to eat something." I instructed Mrs. Alvira to take 1/2 of Lantus at hs, 9 units.  I instructed patient to check CBG after awaking and every 2 hours until arrival  to the hospital.  I Instructed patient if CBG is less than 70 toGlucose  1/2 cup of a clear juice; Mrs. Connelly said that she has been told that she may drink regular 7 up, 4 oz.  I instructed patient to make a note of when she drank fluid. Recheck CBG in 15 minutes then call pre- op desk at [phone] for further instructions.

## 2020-05-07 ENCOUNTER — Ambulatory Visit (HOSPITAL_COMMUNITY): Payer: Medicare Other | Admitting: Anesthesiology

## 2020-05-07 ENCOUNTER — Encounter (HOSPITAL_COMMUNITY)
Admission: RE | Disposition: A | Discharge status: Home / Self Care | Payer: Self-pay | Source: Home / Self Care | Attending: Vascular Surgery

## 2020-05-07 ENCOUNTER — Other Ambulatory Visit: Payer: Self-pay

## 2020-05-07 ENCOUNTER — Ambulatory Visit (HOSPITAL_COMMUNITY)
Admission: RE | Admit: 2020-05-07 | Discharge: 2020-05-07 | Disposition: A | Discharge status: Home / Self Care | Payer: Medicare Other | Attending: Vascular Surgery | Admitting: Vascular Surgery

## 2020-05-07 ENCOUNTER — Encounter (HOSPITAL_COMMUNITY): Payer: Self-pay | Admitting: Vascular Surgery

## 2020-05-07 DIAGNOSIS — I132 Hypertensive heart and chronic kidney disease with heart failure and with stage 5 chronic kidney disease, or end stage renal disease: Secondary | ICD-10-CM | POA: Insufficient documentation

## 2020-05-07 DIAGNOSIS — Z20822 Contact with and (suspected) exposure to covid-19: Secondary | ICD-10-CM | POA: Diagnosis not present

## 2020-05-07 DIAGNOSIS — I313 Pericardial effusion (noninflammatory): Secondary | ICD-10-CM | POA: Diagnosis not present

## 2020-05-07 DIAGNOSIS — I340 Nonrheumatic mitral (valve) insufficiency: Secondary | ICD-10-CM | POA: Diagnosis not present

## 2020-05-07 DIAGNOSIS — Z881 Allergy status to other antibiotic agents status: Secondary | ICD-10-CM | POA: Insufficient documentation

## 2020-05-07 DIAGNOSIS — Z888 Allergy status to other drugs, medicaments and biological substances status: Secondary | ICD-10-CM | POA: Insufficient documentation

## 2020-05-07 DIAGNOSIS — Z7982 Long term (current) use of aspirin: Secondary | ICD-10-CM | POA: Diagnosis not present

## 2020-05-07 DIAGNOSIS — Z9109 Other allergy status, other than to drugs and biological substances: Secondary | ICD-10-CM | POA: Diagnosis not present

## 2020-05-07 DIAGNOSIS — Z794 Long term (current) use of insulin: Secondary | ICD-10-CM | POA: Insufficient documentation

## 2020-05-07 DIAGNOSIS — T82898A Other specified complication of vascular prosthetic devices, implants and grafts, initial encounter: Secondary | ICD-10-CM | POA: Insufficient documentation

## 2020-05-07 DIAGNOSIS — I509 Heart failure, unspecified: Secondary | ICD-10-CM | POA: Diagnosis not present

## 2020-05-07 DIAGNOSIS — K219 Gastro-esophageal reflux disease without esophagitis: Secondary | ICD-10-CM | POA: Diagnosis not present

## 2020-05-07 DIAGNOSIS — N186 End stage renal disease: Secondary | ICD-10-CM

## 2020-05-07 DIAGNOSIS — Z992 Dependence on renal dialysis: Secondary | ICD-10-CM

## 2020-05-07 DIAGNOSIS — X58XXXA Exposure to other specified factors, initial encounter: Secondary | ICD-10-CM | POA: Diagnosis not present

## 2020-05-07 DIAGNOSIS — Z886 Allergy status to analgesic agent status: Secondary | ICD-10-CM | POA: Diagnosis not present

## 2020-05-07 DIAGNOSIS — E1122 Type 2 diabetes mellitus with diabetic chronic kidney disease: Secondary | ICD-10-CM | POA: Diagnosis not present

## 2020-05-07 DIAGNOSIS — Z79899 Other long term (current) drug therapy: Secondary | ICD-10-CM | POA: Diagnosis not present

## 2020-05-07 HISTORY — DX: Other injury of unspecified body region, initial encounter: T14.8XXA

## 2020-05-07 HISTORY — PX: LIGATION OF COMPETING BRANCHES OF ARTERIOVENOUS FISTULA: SHX5949

## 2020-05-07 HISTORY — DX: Depression, unspecified: F32.A

## 2020-05-07 LAB — GLUCOSE, CAPILLARY
Glucose-Capillary: 105 mg/dL — ABNORMAL HIGH (ref 70–99)
Glucose-Capillary: 101 mg/dL — ABNORMAL HIGH (ref 70–99)
Glucose-Capillary: 102 mg/dL — ABNORMAL HIGH (ref 70–99)
Glucose-Capillary: 106 mg/dL — ABNORMAL HIGH (ref 70–99)

## 2020-05-07 LAB — SARS CORONAVIRUS 2 BY RT PCR (HOSPITAL ORDER, PERFORMED IN ~~LOC~~ HOSPITAL LAB): SARS Coronavirus 2: NEGATIVE

## 2020-05-07 LAB — SURGICAL PCR SCREEN
MRSA, PCR: NEGATIVE
Staphylococcus aureus: POSITIVE — AB

## 2020-05-07 SURGERY — LIGATION OF COMPETING BRANCHES OF ARTERIOVENOUS FISTULA
Anesthesia: Monitor Anesthesia Care | Laterality: Right

## 2020-05-07 MED ORDER — VANCOMYCIN HCL IN DEXTROSE 1-5 GM/200ML-% IV SOLN
INTRAVENOUS | Status: AC
  Administered 2020-05-07: 1000 mg via INTRAVENOUS
  Filled 2020-05-07: qty 200

## 2020-05-07 MED ORDER — OXYCODONE HCL 5 MG/5ML PO SOLN: 5.0000 mg | Freq: Once | ORAL | Status: AC | PRN

## 2020-05-07 MED ORDER — LIDOCAINE 2% (20 MG/ML) 5 ML SYRINGE
INTRAMUSCULAR | Status: DC | PRN
  Administered 2020-05-07: 40 mg via INTRAVENOUS

## 2020-05-07 MED ORDER — LIDOCAINE-EPINEPHRINE (PF) 1 %-1:200000 IJ SOLN
INTRAMUSCULAR | Status: AC
  Filled 2020-05-07: qty 60

## 2020-05-07 MED ORDER — MEPERIDINE HCL 25 MG/ML IJ SOLN: 6.2500 mg | INTRAMUSCULAR | Status: DC | PRN

## 2020-05-07 MED ORDER — FENTANYL CITRATE (PF) 100 MCG/2ML IJ SOLN
INTRAMUSCULAR | Status: AC
  Filled 2020-05-07: qty 2

## 2020-05-07 MED ORDER — SODIUM CHLORIDE 0.9 % IV SOLN: INTRAVENOUS | Status: DC

## 2020-05-07 MED ORDER — LIDOCAINE-EPINEPHRINE (PF) 1 %-1:200000 IJ SOLN
INTRAMUSCULAR | Status: DC | PRN
  Administered 2020-05-07: 9 mL

## 2020-05-07 MED ORDER — DEXAMETHASONE SODIUM PHOSPHATE 10 MG/ML IJ SOLN
INTRAMUSCULAR | Status: DC | PRN
  Administered 2020-05-07: 4 mg via INTRAVENOUS

## 2020-05-07 MED ORDER — DEXTROSE 50 % IV SOLN
INTRAVENOUS | Status: AC
  Administered 2020-05-07: 25 mL via INTRAVENOUS
  Filled 2020-05-07: qty 50

## 2020-05-07 MED ORDER — CHLORHEXIDINE GLUCONATE 4 % EX LIQD: 60.0000 mL | Freq: Once | CUTANEOUS | Status: DC

## 2020-05-07 MED ORDER — MIDAZOLAM HCL 2 MG/2ML IJ SOLN: 0.5000 mg | Freq: Once | INTRAMUSCULAR | Status: DC | PRN

## 2020-05-07 MED ORDER — FENTANYL CITRATE (PF) 100 MCG/2ML IJ SOLN
25.0000 ug | INTRAMUSCULAR | Status: DC | PRN
  Administered 2020-05-07 (×2): 50 ug via INTRAVENOUS

## 2020-05-07 MED ORDER — PROMETHAZINE HCL 25 MG/ML IJ SOLN: 6.2500 mg | INTRAMUSCULAR | Status: DC | PRN

## 2020-05-07 MED ORDER — SODIUM CHLORIDE 0.9 % IV SOLN
INTRAVENOUS | Status: AC
  Filled 2020-05-07: qty 1.2

## 2020-05-07 MED ORDER — CHLORHEXIDINE GLUCONATE 0.12 % MT SOLN
OROMUCOSAL | Status: AC
  Administered 2020-05-07: 15 mL via OROMUCOSAL
  Filled 2020-05-07: qty 15

## 2020-05-07 MED ORDER — VANCOMYCIN HCL IN DEXTROSE 1-5 GM/200ML-% IV SOLN: 1000.0000 mg | INTRAVENOUS | Status: AC

## 2020-05-07 MED ORDER — FENTANYL CITRATE (PF) 250 MCG/5ML IJ SOLN
INTRAMUSCULAR | Status: AC
  Filled 2020-05-07: qty 5

## 2020-05-07 MED ORDER — MIDAZOLAM HCL 2 MG/2ML IJ SOLN
INTRAMUSCULAR | Status: AC
  Filled 2020-05-07: qty 2

## 2020-05-07 MED ORDER — OXYCODONE HCL 5 MG PO TABS: 5.0000 mg | ORAL_TABLET | Freq: Four times a day (QID) | ORAL | 0 refills | Status: DC | PRN

## 2020-05-07 MED ORDER — OXYCODONE HCL 5 MG PO TABS
ORAL_TABLET | ORAL | Status: AC
  Administered 2020-05-07: 5 mg via ORAL
  Filled 2020-05-07: qty 1

## 2020-05-07 MED ORDER — ORAL CARE MOUTH RINSE: 15.0000 mL | Freq: Once | OROMUCOSAL | Status: AC

## 2020-05-07 MED ORDER — DEXTROSE 50 % IV SOLN: 25.0000 mL | Freq: Once | INTRAVENOUS | Status: AC

## 2020-05-07 MED ORDER — PROPOFOL 500 MG/50ML IV EMUL
INTRAVENOUS | Status: DC | PRN
  Administered 2020-05-07: 30 ug/kg/min via INTRAVENOUS

## 2020-05-07 MED ORDER — OXYCODONE HCL 5 MG PO TABS: 5.0000 mg | ORAL_TABLET | Freq: Once | ORAL | Status: AC | PRN

## 2020-05-07 MED ORDER — DEXTROSE 50 % IV SOLN
12.5000 mL | Freq: Once | INTRAVENOUS | Status: AC
  Administered 2020-05-07: 12.5 mL via INTRAVENOUS

## 2020-05-07 MED ORDER — 0.9 % SODIUM CHLORIDE (POUR BTL) OPTIME
TOPICAL | Status: DC | PRN
  Administered 2020-05-07: 1000 mL

## 2020-05-07 MED ORDER — FENTANYL CITRATE (PF) 250 MCG/5ML IJ SOLN
INTRAMUSCULAR | Status: DC | PRN
Start: 2020-05-07 — End: 2020-05-07
  Administered 2020-05-07: 50 ug via INTRAVENOUS

## 2020-05-07 MED ORDER — CHLORHEXIDINE GLUCONATE 0.12 % MT SOLN: 15.0000 mL | Freq: Once | OROMUCOSAL | Status: AC

## 2020-05-07 MED ORDER — MIDAZOLAM HCL 5 MG/5ML IJ SOLN
INTRAMUSCULAR | Status: DC | PRN
  Administered 2020-05-07: 1 mg via INTRAVENOUS

## 2020-05-07 SURGICAL SUPPLY — 35 items
CANISTER SUCT 3000ML PPV (MISCELLANEOUS) ×2 IMPLANT
CLIP VESOCCLUDE MED 6/CT (CLIP) ×2 IMPLANT
CLIP VESOCCLUDE SM WIDE 6/CT (CLIP) ×2 IMPLANT
COVER PROBE W GEL 5X96 (DRAPES) IMPLANT
COVER WAND RF STERILE (DRAPES) IMPLANT
DERMABOND ADVANCED (GAUZE/BANDAGES/DRESSINGS) ×1
DERMABOND ADVANCED .7 DNX12 (GAUZE/BANDAGES/DRESSINGS) ×1 IMPLANT
ELECT REM PT RETURN 9FT ADLT (ELECTROSURGICAL) ×2
ELECTRODE REM PT RTRN 9FT ADLT (ELECTROSURGICAL) ×1 IMPLANT
GLOVE BIO SURGEON STRL SZ7.5 (GLOVE) ×2 IMPLANT
GLOVE BIOGEL PI IND STRL 6.5 (GLOVE) ×3 IMPLANT
GLOVE BIOGEL PI IND STRL 7.5 (GLOVE) ×1 IMPLANT
GLOVE BIOGEL PI INDICATOR 6.5 (GLOVE) ×3
GLOVE BIOGEL PI INDICATOR 7.5 (GLOVE) ×1
GLOVE ECLIPSE 6.5 STRL STRAW (GLOVE) ×2 IMPLANT
GLOVE ECLIPSE 7.5 STRL STRAW (GLOVE) ×2 IMPLANT
GOWN STRL NON-REIN LRG LVL3 (GOWN DISPOSABLE) ×2 IMPLANT
GOWN STRL REUS W/ TWL LRG LVL3 (GOWN DISPOSABLE) ×2 IMPLANT
GOWN STRL REUS W/ TWL XL LVL3 (GOWN DISPOSABLE) ×1 IMPLANT
GOWN STRL REUS W/TWL LRG LVL3 (GOWN DISPOSABLE) ×4
GOWN STRL REUS W/TWL XL LVL3 (GOWN DISPOSABLE) ×2
KIT BASIN OR (CUSTOM PROCEDURE TRAY) ×2 IMPLANT
KIT TURNOVER KIT B (KITS) IMPLANT
NS IRRIG 1000ML POUR BTL (IV SOLUTION) ×2 IMPLANT
PACK CV ACCESS (CUSTOM PROCEDURE TRAY) ×2 IMPLANT
PAD ARMBOARD 7.5X6 YLW CONV (MISCELLANEOUS) ×4 IMPLANT
SUT MNCRL AB 4-0 PS2 18 (SUTURE) ×2 IMPLANT
SUT PROLENE 5 0 C 1 24 (SUTURE) ×2 IMPLANT
SUT PROLENE 6 0 BV (SUTURE) IMPLANT
SUT SILK 0 TIES 10X30 (SUTURE) ×2 IMPLANT
SUT VIC AB 3-0 SH 27 (SUTURE) ×2
SUT VIC AB 3-0 SH 27X BRD (SUTURE) ×1 IMPLANT
TOWEL GREEN STERILE (TOWEL DISPOSABLE) ×2 IMPLANT
UNDERPAD 30X36 HEAVY ABSORB (UNDERPADS AND DIAPERS) ×2 IMPLANT
WATER STERILE IRR 1000ML POUR (IV SOLUTION) ×2 IMPLANT

## 2020-05-07 NOTE — Anesthesia Procedure Notes (Signed)
Procedure Name: MAC Date/Time: 05/07/2020 12:37 PM Performed by: Mariea Clonts, CRNA Pre-anesthesia Checklist: Patient identified, Emergency Drugs available, Suction available, Patient being monitored and Timeout performed Patient Re-evaluated:Patient Re-evaluated prior to induction Oxygen Delivery Method: Simple face mask

## 2020-05-07 NOTE — Op Note (Signed)
    Patient name: Belinda [name] MRN: [id number] DOB: 06-02-1951 Sex: female  05/07/2020 Pre-operative Diagnosis: End-stage renal disease, dysfunction right hand following right arm basilic vein fistula transposition Post-operative diagnosis:  Same Surgeon:  Eda Paschal. Donzetta Matters, MD Procedure Performed: Ligation right arm AV fistula  Indications: A 69 year old female with history of end-stage renal disease on dialysis via catheter since April.  She had a recent second stage basilic vein fistula creation subsequently has dysfunction of her hand particularly her index and middle fingers.  While her steal study is negative she may have IMN or nerve injury during the procedure.  We have discussed proceeding with fistula ligation as an attempt to salvage her right hand use.  She would like to avoid any access in her left hand.  Ultimately she may need to have a graft placed in her right upper extremity if her hand function is going to become a chronic problem.  Findings: We identified the previous anastomosis the fistula was divided oversewn towards the antecubitum and tied off distally.  Patient will follow up in a few weeks to discuss further access which may need to be a graft in the right upper extremity.   Procedure:  The patient was identified in the holding taken to the operating room where she is placed supine on operative table and MAC anesthesia induced.  She was sterilely prepped draped in the right upper extremity usual fashion, antibiotics were minister timeout was called.  I anesthetized the incision near the antecubitum.  I then opened this back up.  Identified the fistula dissected out above and below the previous anastomosis.  I clamped on both ends.  I divided the previous anastomosis tied off towards the axilla.  Towards the antecubitum we oversewed with 5-0 Prolene suture in a running mattress fashion.  Upon completion there was hemostasis.  We irrigated the wound.  She still had good  palpable radial artery pulse confirmed with Doppler.  We then closed the wound in layers with Vicryl and Monocryl.  Dermabond was placed at the skin.  She was awakened from anesthesia having tolerated procedure well without any complication.  Counts were correct at completion.  EBL 20 cc   Ruthel Martine C. Donzetta Matters, MD [facility] Office: [phone] Pager: [phone]

## 2020-05-07 NOTE — Interval H&P Note (Signed)
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  The various methods of treatment have been discussed with the patient and family. After consideration of risks, benefits and other options for treatment, the patient has consented to  Procedure(s): LIGATION OF RIGHT UPPER EXTREMITY FISTULA (Right) as a surgical intervention.  The patient's history has been reviewed, patient examined, no change in status, stable for surgery.  I have reviewed the patient's chart and labs.  We discussed possibly leaving the fistula but that her hand will certainly not improve. We have decided for ligation with possible graft in the future. Questions were answered to the patient's satisfaction.     Servando Snare

## 2020-05-07 NOTE — Transfer of Care (Signed)
Immediate Anesthesia Transfer of Care Note  Patient: Belinda [name]  Procedure(s) Performed: LIGATION OF RIGHT UPPER EXTREMITY FISTULA (Right )  Patient Location: PACU  Anesthesia Type:MAC  Level of Consciousness: awake, alert  and oriented  Airway & Oxygen Therapy: Patient Spontanous Breathing and Patient connected to face mask oxygen  Post-op Assessment: Report given to RN, Post -op Vital signs reviewed and stable and Patient moving all extremities X 4  Post vital signs: Reviewed and stable  Last Vitals:  Vitals Value Taken Time  BP 161/87 05/07/20 1318  Temp    Pulse    Resp 12 05/07/20 1322  SpO2    Vitals shown include unvalidated device data.  Last Pain:  Vitals:   05/07/20 1035  TempSrc:   PainSc: 10-Worst pain ever      Patients Stated Pain Goal: 4 (83/09/40 7680)  Complications: No complications documented.

## 2020-05-07 NOTE — Progress Notes (Signed)
Patient reports feeling light headed and shaky.  Blood sugar 101.  Dr. Glennon Mac notified.  Received verbal order to give 1/4 amp D50.

## 2020-05-07 NOTE — Anesthesia Postprocedure Evaluation (Signed)
Anesthesia Post Note  Patient: Belinda [name]  Procedure(s) Performed: LIGATION OF RIGHT UPPER EXTREMITY FISTULA (Right )     Patient location during evaluation: PACU Anesthesia Type: MAC Level of consciousness: awake and alert, patient cooperative and oriented Pain management: pain level controlled Vital Signs Assessment: post-procedure vital signs reviewed and stable Respiratory status: spontaneous breathing, nonlabored ventilation and respiratory function stable Cardiovascular status: blood pressure returned to baseline and stable Postop Assessment: no apparent nausea or vomiting Anesthetic complications: no   No complications documented.  Last Vitals:  Vitals:   05/07/20 1349 05/07/20 1404  BP: (!) 154/84 (!) 155/86  Pulse: 83 84  Resp: 10 17  Temp:  36.5 C  SpO2: 94% 91%    Last Pain:  Vitals:   05/07/20 1404  TempSrc:   PainSc: 0-No pain                 Torrell Krutz,E. Velisa Regnier

## 2020-05-07 NOTE — Anesthesia Preprocedure Evaluation (Addendum)
Anesthesia Evaluation  Patient identified by MRN, date of birth, ID band Patient awake    Reviewed: Allergy & Precautions, NPO status , Patient's Chart, lab work & pertinent test results, reviewed documented beta blocker date and time   History of Anesthesia Complications Negative for: history of anesthetic complications  Airway Mallampati: I  TM Distance: >3 FB Neck ROM: Full    Dental  (+) Missing, Dental Advisory Given   Pulmonary  05/07/2020 SARS coronavirus NEG   breath sounds clear to auscultation       Cardiovascular hypertension, Pt. on medications and Pt. on home beta blockers +CHF   Rhythm:Regular Rate:Normal  '20 ECHO: EF left ventricle has severely reduced systolic function, with EF 25-30%.Left ventrical global hypokinesis 2. The right ventricle has normal systolic function. The cavity was normal.  3. Left atrial size was mildly dilated.  4. Small circumferential pericardial effusion.  5. Mild to moderate mitral valve regurgitation, jet centrally-directed.    Neuro/Psych  Headaches, Anxiety Depression Chronic back pain: wheelchair    GI/Hepatic Neg liver ROS, GERD  Controlled,  Endo/Other  diabetes (glu 105), Insulin Dependent  Renal/GU Dialysis and ESRFRenal disease     Musculoskeletal   Abdominal   Peds  Hematology   Anesthesia Other Findings   Reproductive/Obstetrics                            Anesthesia Physical Anesthesia Plan  ASA: III  Anesthesia Plan: MAC   Post-op Pain Management:    Induction:   PONV Risk Score and Plan: 2 and Ondansetron and Treatment may vary due to age or medical condition  Airway Management Planned: Natural Airway and Simple Face Mask  Additional Equipment: None  Intra-op Plan:   Post-operative Plan:   Informed Consent: I have reviewed the patients History and Physical, chart, labs and discussed the procedure including the  risks, benefits and alternatives for the proposed anesthesia with the patient or authorized representative who has indicated his/her understanding and acceptance.     Dental advisory given  Plan Discussed with: CRNA and Surgeon  Anesthesia Plan Comments:        Anesthesia Quick Evaluation

## 2020-05-08 ENCOUNTER — Encounter (HOSPITAL_COMMUNITY): Payer: Self-pay | Admitting: Vascular Surgery

## 2020-05-09 LAB — POCT I-STAT, CHEM 8
BUN: 41 mg/dL — ABNORMAL HIGH (ref 8–23)
Calcium, Ion: 1.11 mmol/L — ABNORMAL LOW (ref 1.15–1.40)
Chloride: 99 mmol/L (ref 98–111)
Creatinine, Ser: 6.7 mg/dL — ABNORMAL HIGH (ref 0.44–1.00)
Glucose, Bld: 66 mg/dL — ABNORMAL LOW (ref 70–99)
HCT: 42 % (ref 36.0–46.0)
Hemoglobin: 14.3 g/dL (ref 12.0–15.0)
Potassium: 4 mmol/L (ref 3.5–5.1)
Sodium: 139 mmol/L (ref 135–145)
TCO2: 26 mmol/L (ref 22–32)

## 2020-05-12 ENCOUNTER — Other Ambulatory Visit (HOSPITAL_COMMUNITY): Payer: Medicare Other

## 2020-06-13 ENCOUNTER — Ambulatory Visit (INDEPENDENT_AMBULATORY_CARE_PROVIDER_SITE_OTHER): Payer: Self-pay | Admitting: Vascular Surgery

## 2020-06-13 ENCOUNTER — Encounter: Payer: Self-pay | Admitting: Vascular Surgery

## 2020-06-13 ENCOUNTER — Other Ambulatory Visit: Payer: Self-pay

## 2020-06-13 VITALS — BP 135/79 | HR 99 | Temp 97.4°F | Resp 20 | Ht 61.0 in | Wt 134.0 lb

## 2020-06-13 DIAGNOSIS — N186 End stage renal disease: Secondary | ICD-10-CM

## 2020-06-13 NOTE — Progress Notes (Signed)
    Subjective:     Patient ID: Belinda [name], female   DOB: 02/02/1951, 69 y.o.   MRN: [id number]  HPI 69 year old female follows up after ligation of right basilic vein fistula.  This was done for likely combination of IMn and and possible steal.  She has regained some strength in her hand still has some mobility issues with her index finger.  She is not looking forward to any further access procedures at this time.  She also has associated numbness of her forearm.   Review of Systems Right hand weakness, right forearm numbness    Objective:   Physical Exam Vitals:   06/13/20 1518  BP: 135/79  Pulse: 99  Resp: 20  Temp: (!) 97.4 F (36.3 C)  SpO2: 98%   Wounds healed Right radial pulses palpable    Assessment/plan     69 year old female status post ligation of right basilic vein fistula for hand dysfunction.  This appears to have been a combination of IMN and possible steal.  This time she does not want any further surgeries immediately.  I have offered her physical therapy but she would rather not have any further doctors appointments at this time given her 3 days of dialysis weekly.  She we have discussed her future access options she wants to avoid her left upper extremity given that she is left-handed.  We have discussed thigh graft.  She would like to follow-up with one of my partners given our recent complication and I have referred her to Dr. Scot Dock at her request with ABIs to consider thigh grafting in the near future    Dhani Dannemiller C. Donzetta Matters, MD [facility] Office: [phone] Pager: [phone]

## 2020-06-17 ENCOUNTER — Other Ambulatory Visit: Payer: Self-pay

## 2020-06-17 DIAGNOSIS — N186 End stage renal disease: Secondary | ICD-10-CM

## 2020-07-23 ENCOUNTER — Other Ambulatory Visit: Payer: Self-pay

## 2020-07-23 ENCOUNTER — Ambulatory Visit: Payer: Medicare Other | Admitting: Vascular Surgery

## 2020-07-23 ENCOUNTER — Encounter: Payer: Self-pay | Admitting: Vascular Surgery

## 2020-07-23 ENCOUNTER — Ambulatory Visit (HOSPITAL_COMMUNITY)
Admission: RE | Admit: 2020-07-23 | Discharge: 2020-07-23 | Disposition: A | Discharge status: Home / Self Care | Payer: Medicare Other | Source: Ambulatory Visit | Attending: Vascular Surgery | Admitting: Vascular Surgery

## 2020-07-23 VITALS — BP 171/111 | HR 96 | Temp 98.2°F | Ht 60.0 in | Wt 149.6 lb

## 2020-07-23 DIAGNOSIS — N186 End stage renal disease: Secondary | ICD-10-CM | POA: Insufficient documentation

## 2020-07-23 NOTE — H&P (View-Only) (Signed)
REASON FOR VISIT:   To evaluate for new hemodialysis access.  MEDICAL ISSUES:   END-STAGE RENAL DISEASE: Given that she does not want access in the left arm I think a thigh graft is certainly a reasonable option.  She has palpable pedal pulses and normal arterial Doppler studies in the lower extremities.  She is had a previous knee replacement has had chronic swelling in the left leg so I think we should place the graft on the right side.  I schedule her for placement of a right thigh AV graft on Monday 08/11/2020.  We have discussed the indications for the procedure and the potential complications and she is agreeable to proceed.  She dialyzes on Tuesdays Thursdays and Saturdays.  HYPERTENSION: The patient's initial blood pressure today was elevated. We repeated this and this was still elevated. We have encouraged the patient to follow up with their primary care physician for management of their blood pressure.   HPI:   Belinda [name] is a pleasant 70 y.o. female who was last seen by Dr. Donzetta Matters on 06/13/2020. She is status post ligation of a right basilic vein transposition as she had a combination of IMN and steal symptoms in her right upper extremity. She did not want to consider access in the near future given these issues. She did not want to have access in the left upper extremity as she is left-handed. He discussed potentially a thigh graft with her. She set up for ABIs and evaluation for a possible thigh graft.  On my history she denies any history of claudication or rest pain.  She does not want access in the left arm as she is left-handed and given the problems she had on the right side.  She dialyzes on Tuesdays Thursdays and Saturdays.  She had a total knee replacement on the left in 2016 and has had some problems with leg swelling since then.  She does have some occasional right knee pain likely related to arthritis.  Past Medical History:  Diagnosis Date  . Acute CHF (congestive  heart failure) ([place]) 02/02/2018  . Anemia    "get monthly injections to boost my HgB" (02/02/2018)  . Anxiety   . Asthma   . Back pain    intractable low back  . Chronic neck pain   . CKD (chronic kidney disease), stage III ([place])    on dialysis [facility] [employer]  . Depression   . Dysrhythmia    palpitations or heart racing periodic. has had for years- Dr Mare Ferrari follows.  [name] GERD (gastroesophageal reflux disease)   . History of blood transfusion 2016   After knee replacemnet  . Hx MRSA infection   . Hyperlipidemia   . Hypertension   . Migraines    "~ 2/month" (02/02/2018)  . Nerve damage    right hand  . Osteoarthritis    "knees, neck" (02/02/2018)  . Palpitations   . Pneumonia    "twice in 2018" (02/02/2018)  . Pulmonary artery hypertension ([place])   . Seasonal allergies   . Type II diabetes mellitus (HCC)    insulin dependent - fasting 140-200     Family History  Problem Relation Age of Onset  . Heart disease Mother   . Heart failure Father   . Colon cancer Neg Hx   . Stomach cancer Neg Hx   . Esophageal cancer Neg Hx     SOCIAL HISTORY: Social History   Tobacco Use  . Smoking status: Never Smoker  .  Smokeless tobacco: Never Used  Substance Use Topics  . Alcohol use: Never    Allergies  Allergen Reactions  . Entresto [Sacubitril-Valsartan] Itching  . Omnicef [Cefdinir] Other (See Comments)    GI Upset  . Aspirin Other (See Comments)    GI issues, Can tolerate low aspirin  . Augmentin [Amoxicillin-Pot Clavulanate] Nausea And Vomiting and Other (See Comments)    GI issues DID THE REACTION INVOLVE: Swelling of the face/tongue/throat, SOB, or low BP? No Sudden or severe rash/hives, skin peeling, or the inside of the mouth or nose? No Did it require medical treatment? No When did it last happen?long time  If all above answers are "NO", may proceed with cephalosporin use.   . Erythromycin Nausea And Vomiting and Other (See Comments)    Gi issues  .  Gabapentin Swelling  . Nickel Itching and Rash    Breakouts     Current Outpatient Medications  Medication Sig Dispense Refill  . acetaminophen (TYLENOL) 650 MG CR tablet Take 650-1,300 mg by mouth every 8 (eight) hours as needed for pain (pain).     . Alpha-D-Galactosidase (BEANO PO) Take 1-2 tablets by mouth daily as needed (gas). With certain foods.    [name] aspirin EC 81 MG tablet Take 81 mg by mouth 3 (three) times a week. Swallow whole.    . Azelastine HCl 0.15 % SOLN Place 2 sprays into both nostrils daily.    . budesonide-formoterol (SYMBICORT) 160-4.5 MCG/ACT inhaler Inhale 2 puffs into the lungs 2 (two) times daily as needed (cough/respiratory issues.).     [name] diclofenac sodium (VOLTAREN) 1 % GEL Apply 1 application topically 4 (four) times daily as needed (neck pain).     [name] ezetimibe-simvastatin (VYTORIN) 10-40 MG per tablet Take 1 tablet by mouth at bedtime.     . ferric citrate (AURYXIA) 1 GM 210 MG(Fe) tablet Take 210-630 mg by mouth See admin instructions. Take 3 tablets (630 mg) by mouth 3 times daily with meals & take 1 tablet (210 mg) by mouth with each snack    . fexofenadine (ALLEGRA) 180 MG tablet Take 180 mg by mouth daily.    [name] guaiFENesin (MUCINEX) 600 MG 12 hr tablet Take 600 mg by mouth in the morning and at bedtime.     . insulin lispro (HUMALOG) 100 UNIT/ML injection Inject 4-10 Units into the skin 3 (three) times daily before meals. Sliding Scale    . ketotifen (ZADITOR) 0.025 % ophthalmic solution Place 1 drop into both eyes 2 (two) times daily as needed (allergy/dry/irritated eyes.).    [name] LANTUS SOLOSTAR 100 UNIT/ML Solostar Pen Inject 18 Units into the skin at bedtime.   3  . losartan (COZAAR) 25 MG tablet Take 12.5 mg by mouth daily.    . magnesium 30 MG tablet Take 100 mg by mouth 2 (two) times daily.    . Methoxy PEG-Epoetin Beta (MIRCERA IJ) Mircera    . multivitamin (RENA-VIT) TABS tablet Take 1 tablet by mouth daily with breakfast.     . nitroGLYCERIN  (NITROSTAT) 0.4 MG SL tablet Place 1 tablet (0.4 mg total) under the tongue every 5 (five) minutes x 3 doses as needed for chest pain. 6 tablet 1  . oxyCODONE (OXY IR/ROXICODONE) 5 MG immediate release tablet Take 1 tablet (5 mg total) by mouth every 6 (six) hours as needed (for pain score of 1-4). 10 tablet 0  . simethicone (MYLICON) 80 MG chewable tablet Chew 160 mg by mouth daily as needed for flatulence (  indigestion.).     [name] carvedilol (COREG) 3.125 MG tablet Take 3.125 mg by mouth 2 (two) times daily. (Patient not taking: Reported on 07/23/2020)     No current facility-administered medications for this visit.    REVIEW OF SYSTEMS: [X]  denotes positive finding, [ ]  denotes negative finding Cardiac  Comments:  Chest pain or chest pressure:    Shortness of breath upon exertion:    Short of breath when lying flat: x   Irregular heart rhythm: x       Vascular    Pain in calf, thigh, or hip brought on by ambulation:    Pain in feet at night that wakes you up from your sleep:     Blood clot in your veins:    Leg swelling:         Pulmonary    Oxygen at home:    Productive cough:     Wheezing:  x       Neurologic    Sudden weakness in arms or legs:     Sudden numbness in arms or legs:     Sudden onset of difficulty speaking or slurred speech:    Temporary loss of vision in one eye:     Problems with dizziness:         Gastrointestinal    Blood in stool:     Vomited blood:         Genitourinary    Burning when urinating:     Blood in urine:        Psychiatric    Major depression:         Hematologic    Bleeding problems:    Problems with blood clotting too easily:        Skin    Rashes or ulcers:        Constitutional    Fever or chills:     PHYSICAL EXAM:   Vitals:   07/23/20 1431  BP: (!) 171/111  Pulse: 96  Temp: 98.2 F (36.8 C)  TempSrc: Skin  SpO2: 98%  Weight: 149 lb 9.6 oz (67.9 kg)  Height: 5' (1.524 m)    GENERAL: The patient is a  well-nourished female, in no acute distress. The vital signs are documented above. CARDIAC: There is a regular rate and rhythm.  VASCULAR: I do not detect carotid bruits. She has palpable femoral and pedal pulses bilaterally. She has weakly palpable radial pulses bilaterally. She has mild left lower extremity swelling. PULMONARY: There is good air exchange bilaterally without wheezing or rales. ABDOMEN: Soft and non-tender with normal pitched bowel sounds.  MUSCULOSKELETAL: There are no major deformities or cyanosis. NEUROLOGIC: No focal weakness or paresthesias are detected. SKIN: There are no ulcers or rashes noted. PSYCHIATRIC: The patient has a normal affect.  DATA:    ARTERIAL DOPPLER STUDY: I have independently interpreted her arterial Doppler study.  On the right side there is a biphasic dorsalis pedis and posterior tibial signal. ABIs 100%. Toe pressure is 173 mmHg.  On the left side there is a triphasic dorsalis pedis and posterior tibial signal. ABIs 100%. Toe pressures 187 mmHg.    Deitra Mayo [facility] [phone]

## 2020-07-23 NOTE — Progress Notes (Signed)
REASON FOR VISIT:   To evaluate for new hemodialysis access.  MEDICAL ISSUES:   END-STAGE RENAL DISEASE: Given that she does not want access in the left arm I think a thigh graft is certainly a reasonable option.  She has palpable pedal pulses and normal arterial Doppler studies in the lower extremities.  She is had a previous knee replacement has had chronic swelling in the left leg so I think we should place the graft on the right side.  I schedule her for placement of a right thigh AV graft on Monday 08/11/2020.  We have discussed the indications for the procedure and the potential complications and she is agreeable to proceed.  She dialyzes on Tuesdays Thursdays and Saturdays.  HYPERTENSION: The patient's initial blood pressure today was elevated. We repeated this and this was still elevated. We have encouraged the patient to follow up with their primary care physician for management of their blood pressure.   HPI:   Belinda [name] is a pleasant 69 y.o. female who was last seen by Dr. Donzetta Matters on 06/13/2020. She is status post ligation of a right basilic vein transposition as she had a combination of IMN and steal symptoms in her right upper extremity. She did not want to consider access in the near future given these issues. She did not want to have access in the left upper extremity as she is left-handed. He discussed potentially a thigh graft with her. She set up for ABIs and evaluation for a possible thigh graft.  On my history she denies any history of claudication or rest pain.  She does not want access in the left arm as she is left-handed and given the problems she had on the right side.  She dialyzes on Tuesdays Thursdays and Saturdays.  She had a total knee replacement on the left in 2016 and has had some problems with leg swelling since then.  She does have some occasional right knee pain likely related to arthritis.  Past Medical History:  Diagnosis Date  . Acute CHF (congestive  heart failure) ([place]) 02/02/2018  . Anemia    "get monthly injections to boost my HgB" (02/02/2018)  . Anxiety   . Asthma   . Back pain    intractable low back  . Chronic neck pain   . CKD (chronic kidney disease), stage III ([place])    on dialysis [facility]  . Depression   . Dysrhythmia    palpitations or heart racing periodic. has had for years- Dr Mare Ferrari follows.  [name] GERD (gastroesophageal reflux disease)   . History of blood transfusion 2016   After knee replacemnet  . Hx MRSA infection   . Hyperlipidemia   . Hypertension   . Migraines    "~ 2/month" (02/02/2018)  . Nerve damage    right hand  . Osteoarthritis    "knees, neck" (02/02/2018)  . Palpitations   . Pneumonia    "twice in 2018" (02/02/2018)  . Pulmonary artery hypertension ([place])   . Seasonal allergies   . Type II diabetes mellitus (HCC)    insulin dependent - fasting 140-200     Family History  Problem Relation Age of Onset  . Heart disease Mother   . Heart failure Father   . Colon cancer Neg Hx   . Stomach cancer Neg Hx   . Esophageal cancer Neg Hx     SOCIAL HISTORY: Social History   Tobacco Use  . Smoking status: Never Smoker  .  Smokeless tobacco: Never Used  Substance Use Topics  . Alcohol use: Never    Allergies  Allergen Reactions  . Entresto [Sacubitril-Valsartan] Itching  . Omnicef [Cefdinir] Other (See Comments)    GI Upset  . Aspirin Other (See Comments)    GI issues, Can tolerate low aspirin  . Augmentin [Amoxicillin-Pot Clavulanate] Nausea And Vomiting and Other (See Comments)    GI issues DID THE REACTION INVOLVE: Swelling of the face/tongue/throat, SOB, or low BP? No Sudden or severe rash/hives, skin peeling, or the inside of the mouth or nose? No Did it require medical treatment? No When did it last happen?long time  If all above answers are "NO", may proceed with cephalosporin use.   . Erythromycin Nausea And Vomiting and Other (See Comments)    Gi issues  .  Gabapentin Swelling  . Nickel Itching and Rash    Breakouts     Current Outpatient Medications  Medication Sig Dispense Refill  . acetaminophen (TYLENOL) 650 MG CR tablet Take 650-1,300 mg by mouth every 8 (eight) hours as needed for pain (pain).     . Alpha-D-Galactosidase (BEANO PO) Take 1-2 tablets by mouth daily as needed (gas). With certain foods.    [name] aspirin EC 81 MG tablet Take 81 mg by mouth 3 (three) times a week. Swallow whole.    . Azelastine HCl 0.15 % SOLN Place 2 sprays into both nostrils daily.    . budesonide-formoterol (SYMBICORT) 160-4.5 MCG/ACT inhaler Inhale 2 puffs into the lungs 2 (two) times daily as needed (cough/respiratory issues.).     [name] diclofenac sodium (VOLTAREN) 1 % GEL Apply 1 application topically 4 (four) times daily as needed (neck pain).     [name] ezetimibe-simvastatin (VYTORIN) 10-40 MG per tablet Take 1 tablet by mouth at bedtime.     . ferric citrate (AURYXIA) 1 GM 210 MG(Fe) tablet Take 210-630 mg by mouth See admin instructions. Take 3 tablets (630 mg) by mouth 3 times daily with meals & take 1 tablet (210 mg) by mouth with each snack    . fexofenadine (ALLEGRA) 180 MG tablet Take 180 mg by mouth daily.    [name] guaiFENesin (MUCINEX) 600 MG 12 hr tablet Take 600 mg by mouth in the morning and at bedtime.     . insulin lispro (HUMALOG) 100 UNIT/ML injection Inject 4-10 Units into the skin 3 (three) times daily before meals. Sliding Scale    . ketotifen (ZADITOR) 0.025 % ophthalmic solution Place 1 drop into both eyes 2 (two) times daily as needed (allergy/dry/irritated eyes.).    [name] LANTUS SOLOSTAR 100 UNIT/ML Solostar Pen Inject 18 Units into the skin at bedtime.   3  . losartan (COZAAR) 25 MG tablet Take 12.5 mg by mouth daily.    . magnesium 30 MG tablet Take 100 mg by mouth 2 (two) times daily.    . Methoxy PEG-Epoetin Beta (MIRCERA IJ) Mircera    . multivitamin (RENA-VIT) TABS tablet Take 1 tablet by mouth daily with breakfast.     . nitroGLYCERIN  (NITROSTAT) 0.4 MG SL tablet Place 1 tablet (0.4 mg total) under the tongue every 5 (five) minutes x 3 doses as needed for chest pain. 6 tablet 1  . oxyCODONE (OXY IR/ROXICODONE) 5 MG immediate release tablet Take 1 tablet (5 mg total) by mouth every 6 (six) hours as needed (for pain score of 1-4). 10 tablet 0  . simethicone (MYLICON) 80 MG chewable tablet Chew 160 mg by mouth daily as needed for flatulence (  indigestion.).     [name] carvedilol (COREG) 3.125 MG tablet Take 3.125 mg by mouth 2 (two) times daily. (Patient not taking: Reported on 07/23/2020)     No current facility-administered medications for this visit.    REVIEW OF SYSTEMS: [X]  denotes positive finding, [ ]  denotes negative finding Cardiac  Comments:  Chest pain or chest pressure:    Shortness of breath upon exertion:    Short of breath when lying flat: x   Irregular heart rhythm: x       Vascular    Pain in calf, thigh, or hip brought on by ambulation:    Pain in feet at night that wakes you up from your sleep:     Blood clot in your veins:    Leg swelling:         Pulmonary    Oxygen at home:    Productive cough:     Wheezing:  x       Neurologic    Sudden weakness in arms or legs:     Sudden numbness in arms or legs:     Sudden onset of difficulty speaking or slurred speech:    Temporary loss of vision in one eye:     Problems with dizziness:         Gastrointestinal    Blood in stool:     Vomited blood:         Genitourinary    Burning when urinating:     Blood in urine:        Psychiatric    Major depression:         Hematologic    Bleeding problems:    Problems with blood clotting too easily:        Skin    Rashes or ulcers:        Constitutional    Fever or chills:     PHYSICAL EXAM:   Vitals:   07/23/20 1431  BP: (!) 171/111  Pulse: 96  Temp: 98.2 F (36.8 C)  TempSrc: Skin  SpO2: 98%  Weight: 149 lb 9.6 oz (67.9 kg)  Height: 5' (1.524 m)    GENERAL: The patient is a  well-nourished female, in no acute distress. The vital signs are documented above. CARDIAC: There is a regular rate and rhythm.  VASCULAR: I do not detect carotid bruits. She has palpable femoral and pedal pulses bilaterally. She has weakly palpable radial pulses bilaterally. She has mild left lower extremity swelling. PULMONARY: There is good air exchange bilaterally without wheezing or rales. ABDOMEN: Soft and non-tender with normal pitched bowel sounds.  MUSCULOSKELETAL: There are no major deformities or cyanosis. NEUROLOGIC: No focal weakness or paresthesias are detected. SKIN: There are no ulcers or rashes noted. PSYCHIATRIC: The patient has a normal affect.  DATA:    ARTERIAL DOPPLER STUDY: I have independently interpreted her arterial Doppler study.  On the right side there is a biphasic dorsalis pedis and posterior tibial signal. ABIs 100%. Toe pressure is 173 mmHg.  On the left side there is a triphasic dorsalis pedis and posterior tibial signal. ABIs 100%. Toe pressures 187 mmHg.    Deitra Mayo [facility] [phone]

## 2020-08-08 ENCOUNTER — Other Ambulatory Visit (HOSPITAL_COMMUNITY)
Admission: RE | Admit: 2020-08-08 | Discharge: 2020-08-08 | Disposition: A | Discharge status: Home / Self Care | Payer: Medicare Other | Source: Ambulatory Visit | Attending: Vascular Surgery | Admitting: Vascular Surgery

## 2020-08-08 ENCOUNTER — Encounter (HOSPITAL_COMMUNITY): Payer: Self-pay | Admitting: Vascular Surgery

## 2020-08-08 DIAGNOSIS — Z20822 Contact with and (suspected) exposure to covid-19: Secondary | ICD-10-CM | POA: Insufficient documentation

## 2020-08-08 DIAGNOSIS — Z01812 Encounter for preprocedural laboratory examination: Secondary | ICD-10-CM | POA: Insufficient documentation

## 2020-08-08 LAB — SARS CORONAVIRUS 2 (TAT 6-24 HRS): SARS Coronavirus 2: NEGATIVE

## 2020-08-08 NOTE — Progress Notes (Signed)
Spoke with pt for pre-op call. Pt has hx of HTN, CHF and Diabetes. Pt states she has not had any flare ups of her CHF. Dr. Haroldine Laws is her cardiologist. Pt's most recent A1C was 9.6 on 07/10/20. Pt states her fasting blood sugars are usually less than 200. Instructed pt to take 1/2 of her regular dose of her Lantus Insulin Sunday PM. She will take 9 units. Instructed pt to check her blood sugar when she gets up Monday AM. If blood sugar is >220 take 1/2 of usual correction dose of Humalog insulin. If blood sugar is 70 or below, treat with 1/2 cup of clear juice (apple or cranberry) and recheck blood sugar 15 minutes after drinking juice. If blood sugar continues to be 70 or below be sure to tell your nurse on arrival.   Covid test done today, result pending.  Pt states she's been in quarantine since the test was done and understands that she stays in quarantine until she comes to the hospital on Monday. She will go to dialysis on Saturday but will wear mask, social distance and wash hands.

## 2020-08-11 ENCOUNTER — Encounter (HOSPITAL_COMMUNITY): Payer: Self-pay | Admitting: Vascular Surgery

## 2020-08-11 ENCOUNTER — Other Ambulatory Visit: Payer: Self-pay

## 2020-08-11 ENCOUNTER — Ambulatory Visit (HOSPITAL_COMMUNITY): Payer: Medicare Other | Admitting: Anesthesiology

## 2020-08-11 ENCOUNTER — Encounter (HOSPITAL_COMMUNITY)
Admission: RE | Disposition: A | Discharge status: Home / Self Care | Payer: Self-pay | Source: Home / Self Care | Attending: Vascular Surgery

## 2020-08-11 ENCOUNTER — Ambulatory Visit (HOSPITAL_COMMUNITY)
Admission: RE | Admit: 2020-08-11 | Discharge: 2020-08-11 | Disposition: A | Discharge status: Home / Self Care | Payer: Medicare Other | Attending: Vascular Surgery | Admitting: Vascular Surgery

## 2020-08-11 ENCOUNTER — Encounter: Payer: Self-pay | Admitting: Gastroenterology

## 2020-08-11 DIAGNOSIS — Z79899 Other long term (current) drug therapy: Secondary | ICD-10-CM | POA: Insufficient documentation

## 2020-08-11 DIAGNOSIS — Z7982 Long term (current) use of aspirin: Secondary | ICD-10-CM | POA: Insufficient documentation

## 2020-08-11 DIAGNOSIS — Z881 Allergy status to other antibiotic agents status: Secondary | ICD-10-CM | POA: Insufficient documentation

## 2020-08-11 DIAGNOSIS — Z794 Long term (current) use of insulin: Secondary | ICD-10-CM | POA: Insufficient documentation

## 2020-08-11 DIAGNOSIS — Z992 Dependence on renal dialysis: Secondary | ICD-10-CM | POA: Diagnosis not present

## 2020-08-11 DIAGNOSIS — E1122 Type 2 diabetes mellitus with diabetic chronic kidney disease: Secondary | ICD-10-CM | POA: Insufficient documentation

## 2020-08-11 DIAGNOSIS — Z886 Allergy status to analgesic agent status: Secondary | ICD-10-CM | POA: Diagnosis not present

## 2020-08-11 DIAGNOSIS — N186 End stage renal disease: Secondary | ICD-10-CM | POA: Diagnosis not present

## 2020-08-11 DIAGNOSIS — Z888 Allergy status to other drugs, medicaments and biological substances status: Secondary | ICD-10-CM | POA: Insufficient documentation

## 2020-08-11 DIAGNOSIS — N185 Chronic kidney disease, stage 5: Secondary | ICD-10-CM

## 2020-08-11 DIAGNOSIS — I120 Hypertensive chronic kidney disease with stage 5 chronic kidney disease or end stage renal disease: Secondary | ICD-10-CM | POA: Diagnosis not present

## 2020-08-11 HISTORY — PX: AV FISTULA PLACEMENT: SHX1204

## 2020-08-11 LAB — POCT I-STAT, CHEM 8
BUN: 45 mg/dL — ABNORMAL HIGH (ref 8–23)
Calcium, Ion: 1.17 mmol/L (ref 1.15–1.40)
Chloride: 95 mmol/L — ABNORMAL LOW (ref 98–111)
Creatinine, Ser: 7.2 mg/dL — ABNORMAL HIGH (ref 0.44–1.00)
Glucose, Bld: 125 mg/dL — ABNORMAL HIGH (ref 70–99)
HCT: 33 % — ABNORMAL LOW (ref 36.0–46.0)
Hemoglobin: 11.2 g/dL — ABNORMAL LOW (ref 12.0–15.0)
Potassium: 4.3 mmol/L (ref 3.5–5.1)
Sodium: 137 mmol/L (ref 135–145)
TCO2: 30 mmol/L (ref 22–32)

## 2020-08-11 LAB — GLUCOSE, CAPILLARY
Glucose-Capillary: 144 mg/dL — ABNORMAL HIGH (ref 70–99)
Glucose-Capillary: 98 mg/dL (ref 70–99)
Glucose-Capillary: 161 mg/dL — ABNORMAL HIGH (ref 70–99)

## 2020-08-11 LAB — SURGICAL PCR SCREEN
MRSA, PCR: NEGATIVE
Staphylococcus aureus: POSITIVE — AB

## 2020-08-11 SURGERY — INSERTION OF ARTERIOVENOUS (AV) GORE-TEX GRAFT THIGH
Anesthesia: General | Site: Thigh | Laterality: Right

## 2020-08-11 MED ORDER — LIDOCAINE HCL (PF) 1 % IJ SOLN
INTRAMUSCULAR | Status: AC
  Filled 2020-08-11: qty 30

## 2020-08-11 MED ORDER — CHLORHEXIDINE GLUCONATE 0.12 % MT SOLN
15.0000 mL | Freq: Once | OROMUCOSAL | Status: AC
  Administered 2020-08-11: 15 mL via OROMUCOSAL
  Filled 2020-08-11: qty 15

## 2020-08-11 MED ORDER — ONDANSETRON HCL 4 MG/2ML IJ SOLN
INTRAMUSCULAR | Status: DC | PRN
  Administered 2020-08-11: 4 mg via INTRAVENOUS

## 2020-08-11 MED ORDER — ACETAMINOPHEN 325 MG PO TABS
325.0000 mg | ORAL_TABLET | Freq: Once | ORAL | Status: DC | PRN
Start: 2020-08-11 — End: 2020-08-11

## 2020-08-11 MED ORDER — DEXTROSE 50 % IV SOLN
INTRAVENOUS | Status: DC | PRN
  Administered 2020-08-11: .5 via INTRAVENOUS

## 2020-08-11 MED ORDER — CHLORHEXIDINE GLUCONATE 4 % EX LIQD: 60.0000 mL | Freq: Once | CUTANEOUS | Status: DC

## 2020-08-11 MED ORDER — SODIUM CHLORIDE 0.9 % IV SOLN
INTRAVENOUS | Status: DC | PRN
  Administered 2020-08-11: 500 mL

## 2020-08-11 MED ORDER — VANCOMYCIN HCL IN DEXTROSE 1-5 GM/200ML-% IV SOLN
1000.0000 mg | INTRAVENOUS | Status: AC
  Administered 2020-08-11: 1000 mg via INTRAVENOUS
  Filled 2020-08-11: qty 200

## 2020-08-11 MED ORDER — FENTANYL CITRATE (PF) 100 MCG/2ML IJ SOLN: 25.0000 ug | INTRAMUSCULAR | Status: DC | PRN

## 2020-08-11 MED ORDER — LIDOCAINE-EPINEPHRINE (PF) 1 %-1:200000 IJ SOLN
INTRAMUSCULAR | Status: AC
  Filled 2020-08-11: qty 30

## 2020-08-11 MED ORDER — PROTAMINE SULFATE 10 MG/ML IV SOLN
INTRAVENOUS | Status: DC | PRN
  Administered 2020-08-11 (×2): 10 mg via INTRAVENOUS
  Administered 2020-08-11: 20 mg via INTRAVENOUS

## 2020-08-11 MED ORDER — PHENYLEPHRINE HCL-NACL 10-0.9 MG/250ML-% IV SOLN
INTRAVENOUS | Status: DC | PRN
  Administered 2020-08-11: 40 ug/min via INTRAVENOUS

## 2020-08-11 MED ORDER — SODIUM CHLORIDE 0.9 % IV SOLN
INTRAVENOUS | Status: AC
  Filled 2020-08-11: qty 1.2

## 2020-08-11 MED ORDER — HEMOSTATIC AGENTS (NO CHARGE) OPTIME
TOPICAL | Status: DC | PRN
  Administered 2020-08-11: 1 via TOPICAL

## 2020-08-11 MED ORDER — LIDOCAINE HCL (PF) 1 % IJ SOLN
INTRAMUSCULAR | Status: DC | PRN
  Administered 2020-08-11: 20 mL via INTRADERMAL

## 2020-08-11 MED ORDER — SODIUM CHLORIDE 0.9 % IV SOLN: INTRAVENOUS | Status: DC

## 2020-08-11 MED ORDER — DEXTROSE 50 % IV SOLN
INTRAVENOUS | Status: AC
  Filled 2020-08-11: qty 50

## 2020-08-11 MED ORDER — ACETAMINOPHEN 10 MG/ML IV SOLN
1000.0000 mg | Freq: Once | INTRAVENOUS | Status: DC | PRN
Start: 2020-08-11 — End: 2020-08-11

## 2020-08-11 MED ORDER — 0.9 % SODIUM CHLORIDE (POUR BTL) OPTIME
TOPICAL | Status: DC | PRN
  Administered 2020-08-11: 1000 mL

## 2020-08-11 MED ORDER — ACETAMINOPHEN 10 MG/ML IV SOLN
INTRAVENOUS | Status: AC
  Administered 2020-08-11: 1000 mg via INTRAVENOUS
  Filled 2020-08-11: qty 100

## 2020-08-11 MED ORDER — LIDOCAINE-EPINEPHRINE (PF) 1 %-1:200000 IJ SOLN
INTRAMUSCULAR | Status: DC | PRN
  Administered 2020-08-11: 30 mL via INTRADERMAL

## 2020-08-11 MED ORDER — MIDAZOLAM HCL 2 MG/2ML IJ SOLN
INTRAMUSCULAR | Status: AC
  Filled 2020-08-11: qty 2

## 2020-08-11 MED ORDER — DEXAMETHASONE SODIUM PHOSPHATE 10 MG/ML IJ SOLN
INTRAMUSCULAR | Status: DC | PRN
  Administered 2020-08-11: 5 mg via INTRAVENOUS

## 2020-08-11 MED ORDER — HEPARIN SODIUM (PORCINE) 1000 UNIT/ML IJ SOLN
INTRAMUSCULAR | Status: AC
  Filled 2020-08-11: qty 1

## 2020-08-11 MED ORDER — APREPITANT 40 MG PO CAPS
40.0000 mg | ORAL_CAPSULE | ORAL | Status: AC
  Administered 2020-08-11: 40 mg via ORAL
  Filled 2020-08-11: qty 1

## 2020-08-11 MED ORDER — ACETAMINOPHEN 160 MG/5ML PO SOLN
325.0000 mg | Freq: Once | ORAL | Status: DC | PRN
Start: 2020-08-11 — End: 2020-08-11

## 2020-08-11 MED ORDER — OXYCODONE-ACETAMINOPHEN 5-325 MG PO TABS: 1.0000 | ORAL_TABLET | ORAL | 0 refills | Status: DC | PRN

## 2020-08-11 MED ORDER — FENTANYL CITRATE (PF) 250 MCG/5ML IJ SOLN
INTRAMUSCULAR | Status: AC
  Filled 2020-08-11: qty 5

## 2020-08-11 MED ORDER — PROPOFOL 10 MG/ML IV BOLUS
INTRAVENOUS | Status: AC
  Filled 2020-08-11: qty 20

## 2020-08-11 MED ORDER — PROPOFOL 10 MG/ML IV BOLUS
INTRAVENOUS | Status: DC | PRN
  Administered 2020-08-11: 70 mg via INTRAVENOUS
  Administered 2020-08-11: 40 mg via INTRAVENOUS
  Administered 2020-08-11: 90 mg via INTRAVENOUS

## 2020-08-11 MED ORDER — AMISULPRIDE (ANTIEMETIC) 5 MG/2ML IV SOLN: 10.0000 mg | Freq: Once | INTRAVENOUS | Status: DC | PRN

## 2020-08-11 MED ORDER — HEPARIN SODIUM (PORCINE) 1000 UNIT/ML IJ SOLN
INTRAMUSCULAR | Status: DC | PRN
  Administered 2020-08-11: 7000 [IU] via INTRAVENOUS

## 2020-08-11 MED ORDER — MEPERIDINE HCL 25 MG/ML IJ SOLN
6.2500 mg | INTRAMUSCULAR | Status: DC | PRN
Start: 2020-08-11 — End: 2020-08-11

## 2020-08-11 MED ORDER — LACTATED RINGERS IV SOLN: INTRAVENOUS | Status: DC

## 2020-08-11 MED ORDER — FENTANYL CITRATE (PF) 250 MCG/5ML IJ SOLN
INTRAMUSCULAR | Status: DC | PRN
  Administered 2020-08-11 (×2): 50 ug via INTRAVENOUS

## 2020-08-11 SURGICAL SUPPLY — 37 items
CANISTER SUCT 3000ML PPV (MISCELLANEOUS) ×2 IMPLANT
CANNULA VESSEL 3MM 2 BLNT TIP (CANNULA) ×2 IMPLANT
CLIP VESOCCLUDE MED 6/CT (CLIP) ×2 IMPLANT
CLIP VESOCCLUDE SM WIDE 6/CT (CLIP) ×2 IMPLANT
COVER LIGHT HANDLE  1/PK (MISCELLANEOUS) ×2
COVER LIGHT HANDLE 1/PK (MISCELLANEOUS) ×1 IMPLANT
DERMABOND ADVANCED (GAUZE/BANDAGES/DRESSINGS) ×1
DERMABOND ADVANCED .7 DNX12 (GAUZE/BANDAGES/DRESSINGS) ×1 IMPLANT
ELECT REM PT RETURN 9FT ADLT (ELECTROSURGICAL) ×2
ELECTRODE REM PT RTRN 9FT ADLT (ELECTROSURGICAL) ×1 IMPLANT
GLOVE BIO SURGEON STRL SZ7.5 (GLOVE) ×4 IMPLANT
GLOVE SRG 8 PF TXTR STRL LF DI (GLOVE) ×1 IMPLANT
GLOVE SURG UNDER POLY LF SZ6.5 (GLOVE) ×8 IMPLANT
GLOVE SURG UNDER POLY LF SZ7 (GLOVE) ×2 IMPLANT
GLOVE SURG UNDER POLY LF SZ8 (GLOVE) ×2
GOWN STRL REUS W/ TWL LRG LVL3 (GOWN DISPOSABLE) ×3 IMPLANT
GOWN STRL REUS W/TWL LRG LVL3 (GOWN DISPOSABLE) ×6
GRAFT GORETEX STRT 4-7X45 (Vascular Products) ×2 IMPLANT
HEMOSTAT HEMOBLAST BELLOWS (HEMOSTASIS) ×2 IMPLANT
KIT BASIN OR (CUSTOM PROCEDURE TRAY) ×2 IMPLANT
KIT TURNOVER KIT B (KITS) ×2 IMPLANT
NEEDLE HYPO 25GX1X1/2 BEV (NEEDLE) ×4 IMPLANT
NS IRRIG 1000ML POUR BTL (IV SOLUTION) ×2 IMPLANT
PACK PERIPHERAL VASCULAR (CUSTOM PROCEDURE TRAY) ×2 IMPLANT
PAD ARMBOARD 7.5X6 YLW CONV (MISCELLANEOUS) ×4 IMPLANT
SPONGE SURGIFOAM ABS GEL 100 (HEMOSTASIS) IMPLANT
SUT PROLENE 6 0 BV (SUTURE) ×6 IMPLANT
SUT SILK 2 0 SH (SUTURE) ×2 IMPLANT
SUT VIC AB 2-0 CTB1 (SUTURE) ×2 IMPLANT
SUT VIC AB 3-0 SH 27 (SUTURE) ×6
SUT VIC AB 3-0 SH 27X BRD (SUTURE) ×3 IMPLANT
SUT VICRYL 4-0 PS2 18IN ABS (SUTURE) ×8 IMPLANT
SYR BULB IRRIG 60ML STRL (SYRINGE) ×2 IMPLANT
SYR CONTROL 10ML LL (SYRINGE) ×2 IMPLANT
TOWEL GREEN STERILE (TOWEL DISPOSABLE) ×2 IMPLANT
UNDERPAD 30X36 HEAVY ABSORB (UNDERPADS AND DIAPERS) ×2 IMPLANT
WATER STERILE IRR 1000ML POUR (IV SOLUTION) ×2 IMPLANT

## 2020-08-11 NOTE — Op Note (Signed)
    NAME: Belinda [name]    MRN: [id number] DOB: 01-Apr-1951    DATE OF OPERATION: 08/11/2020  PREOP DIAGNOSIS:    End-stage renal disease  POSTOP DIAGNOSIS:    Same  PROCEDURE:    New right thigh AV graft (4-7 mm PTFE graft)  SURGEON: Judeth Cornfield. Scot Dock, MD  ASSIST: Karoline Caldwell, PA  ANESTHESIA: General  EBL: Minimal  INDICATIONS:    Belinda [name] is a 69 y.o. female who presents for new access.  She had steal symptoms in her right arm and did not want access placed in her left arm.  She presents for a thigh graft  FINDINGS:   Excellent thrill and right thigh AV graft at the completion of the procedure.  The venous anastomosis is to the great saphenous vein in the proximal thigh.  TECHNIQUE:   The patient was taken to the operating room and received a general anesthetic.  The right thigh was prepped and draped in usual sterile fashion.  An oblique incision was made in the right groin to facilitate wound healing postoperatively.  Through this dissection the common femoral artery was dissected free.  It was soft and had a good pulse.  In order to plan for subsequent revisions on the vein I dissected out the saphenous vein in the proximal thigh medially.  Using 1 distal counterincision a 4-7 mm PTFE graft was tunneled with the arterial aspect of the graft laterally.  The patient was then heparinized.  The femoral artery was then clamped proximally and distally and a longitudinal arteriotomy was made.  A short segment of the 4 mm of the graft was excised, the graft spatulated and sewn end-to-side to the artery using continuous 6-0 Prolene suture.  The graft then pulled the appropriate length for anastomosis to the saphenous vein.  The vein was ligated distally and spatulated proximally.  The graft was cut to the appropriate length, spatulated and sewn end to end to the vein using 2 continuous 6-0 Prolene sutures.  At the completion there was a good thrill in the graft.  The  heparin was partially reversed with protamine.  The counterincision was closed with a deep layer of 3-0 Vicryl and the skin closed with 4-0 Vicryl.  The incision over the venous anastomosis was closed with a deep layer of 3-0 Vicryl and the skin closed with 4-0 Vicryl.  The groin incision was closed with a deep layer of 2-0 Vicryl, the subcutaneous layer with 3-0 Vicryl and the skin closed with 4-0 Vicryl.  Dermabond was applied.  The patient tolerated the procedure well was transferred to the recovery room in stable condition.  All needle and sponge counts were correct.  Given the complexity of the case a first assistant was necessary in order to expedient the procedure and safely perform the technical aspects of the operation.  Deitra Mayo, MD, FACS [facility]  DATE OF DICTATION:   08/11/2020

## 2020-08-11 NOTE — Anesthesia Postprocedure Evaluation (Signed)
Anesthesia Post Note  Patient: Belinda [name]  Procedure(s) Performed: INSERTION OF RIGHT THIGH ARTERIOVENOUS (AV) GORE-TEX GRAFT (Right Thigh)     Patient location during evaluation: PACU Anesthesia Type: General Level of consciousness: awake and alert Pain management: pain level controlled Vital Signs Assessment: post-procedure vital signs reviewed and stable Respiratory status: spontaneous breathing, nonlabored ventilation, respiratory function stable and patient connected to nasal cannula oxygen Cardiovascular status: blood pressure returned to baseline and stable Postop Assessment: no apparent nausea or vomiting Anesthetic complications: no   No complications documented.  Last Vitals:  Vitals:   08/11/20 0548 08/11/20 1000  BP: (!) 172/87 (!) 145/73  Pulse: 93 95  Resp: 17 12  Temp: 36.9 C (!) 36.2 C  SpO2: 100% 96%                  Effie Berkshire

## 2020-08-11 NOTE — Transfer of Care (Signed)
Immediate Anesthesia Transfer of Care Note  Patient: Belinda [name]  Procedure(s) Performed: INSERTION OF RIGHT THIGH ARTERIOVENOUS (AV) GORE-TEX GRAFT (Right Thigh)  Patient Location: PACU  Anesthesia Type:General  Level of Consciousness: awake, alert  and oriented  Airway & Oxygen Therapy: Patient Spontanous Breathing and Patient connected to nasal cannula oxygen  Post-op Assessment: Report given to RN, Post -op Vital signs reviewed and stable and Patient moving all extremities X 4  Post vital signs: Reviewed and stable  Last Vitals:  Vitals Value Taken Time  BP 129/79 08/11/20 0931  Temp    Pulse 95 08/11/20 0934  Resp 20 08/11/20 0934  SpO2 100 % 08/11/20 0934  Vitals shown include unvalidated device data.  Last Pain:  Vitals:   08/11/20 0648  TempSrc:   PainSc: 4       Patients Stated Pain Goal: 5 (18/55/01 5868)  Complications: No complications documented.

## 2020-08-11 NOTE — Anesthesia Preprocedure Evaluation (Addendum)
Anesthesia Evaluation  Patient identified by MRN, date of birth, ID band Patient awake    Reviewed: Allergy & Precautions, NPO status , Patient's Chart, lab work & pertinent test results  Airway Mallampati: II  TM Distance: >3 FB Neck ROM: Full    Dental  (+) Teeth Intact, Dental Advisory Given   Pulmonary asthma ,    breath sounds clear to auscultation       Cardiovascular hypertension, +CHF  + dysrhythmias  Rhythm:Regular Rate:Normal     Neuro/Psych  Headaches, PSYCHIATRIC DISORDERS Anxiety Depression    GI/Hepatic   Endo/Other  diabetes, Type 2, Insulin Dependent  Renal/GU ESRF and DialysisRenal disease     Musculoskeletal  (+) Arthritis ,   Abdominal Normal abdominal exam  (+)   Peds  Hematology   Anesthesia Other Findings - HLD  Reproductive/Obstetrics                            Anesthesia Physical Anesthesia Plan  ASA: III  Anesthesia Plan: General   Post-op Pain Management:    Induction: Intravenous  PONV Risk Score and Plan: 4 or greater and Ondansetron, Midazolam, Propofol infusion and Aprepitant  Airway Management Planned: LMA  Additional Equipment: None  Intra-op Plan:   Post-operative Plan: Extubation in OR  Informed Consent: I have reviewed the patients History and Physical, chart, labs and discussed the procedure including the risks, benefits and alternatives for the proposed anesthesia with the patient or authorized representative who has indicated his/her understanding and acceptance.     Dental advisory given  Plan Discussed with: CRNA  Anesthesia Plan Comments: (Lab Results      Component                Value               Date                      WBC                      6.1                 01/29/2019                HGB                      11.2 (L)            08/11/2020                HCT                      33.0 (L)            08/11/2020                 MCV                      83.1                01/29/2019                PLT                      217                 01/29/2019           )  Anesthesia Quick Evaluation  

## 2020-08-11 NOTE — Interval H&P Note (Signed)
History and Physical Interval Note:  08/11/2020 7:07 AM  Belinda [name]  has presented today for surgery, with the diagnosis of ESRD.  The various methods of treatment have been discussed with the patient and family. After consideration of risks, benefits and other options for treatment, the patient has consented to  Procedure(s): INSERTION OF RIGHT THIGH ARTERIOVENOUS (AV) GORE-TEX GRAFT (Right) as a surgical intervention.  The patient's history has been reviewed, patient examined, no change in status, stable for surgery.  I have reviewed the patient's chart and labs.  Questions were answered to the patient's satisfaction.     Deitra Mayo

## 2020-08-11 NOTE — Discharge Instructions (Signed)
[facility]  Discharge Instructions  AV Fistula or Graft Surgery for Dialysis Access  Please refer to the following instructions for your post-procedure care. Your surgeon or physician assistant will discuss any changes with you.  Activity  You may drive the day following your surgery, if you are comfortable and no longer taking prescription pain medication. Resume full activity as the soreness in your incision resolves.  Bathing/Showering  You may shower after you go home. Keep your incision dry for 48 hours. Do not soak in a bathtub, hot tub, or swim until the incision heals completely. You may not shower if you have a hemodialysis catheter.  Incision Care  Clean your incision with mild soap and water after 48 hours. Pat the area dry with a clean towel. You do not need a bandage unless otherwise instructed. Do not apply any ointments or creams to your incision. You may have skin glue on your incision. Do not peel it off. It will come off on its own in about one week. Your arm may swell a bit after surgery. To reduce swelling use pillows to elevate your arm so it is above your heart. Your doctor will tell you if you need to lightly wrap your arm with an ACE bandage.  Diet  Resume your normal diet. There are not special food restrictions following this procedure. In order to heal from your surgery, it is CRITICAL to get adequate nutrition. Your body requires vitamins, minerals, and protein. Vegetables are the best source of vitamins and minerals. Vegetables also provide the perfect balance of protein. Processed food has little nutritional value, so try to avoid this.  Medications  Resume taking all of your medications. If your incision is causing pain, you may take over-the counter pain relievers such as acetaminophen (Tylenol). If you were prescribed a stronger pain medication, please be aware these medications can cause nausea and constipation. Prevent  nausea by taking the medication with a snack or meal. Avoid constipation by drinking plenty of fluids and eating foods with high amount of fiber, such as fruits, vegetables, and grains.  Do not take Tylenol if you are taking prescription pain medications.  Follow up Your surgeon may want to see you in the office following your access surgery. If so, this will be arranged at the time of your surgery.  Please call us immediately for any of the following conditions:   Increased pain, redness, drainage (pus) from your incision site  Fever of 101 degrees or higher  Severe or worsening pain at your incision site  Hand pain or numbness.   Reduce your risk of vascular disease:   Stop smoking. If you would like help, call [facility] at 1-800-QUIT-NOW (531)421-1831) or [place] at [place] your cholesterol  Maintain a desired weight  Control your diabetes  Keep your blood pressure down  Dialysis  It will take several weeks to several months for your new dialysis access to be ready for use. Your surgeon will determine when it is okay to use it. Your nephrologist will continue to direct your dialysis. You can continue to use your Permcath until your new access is ready for use.   08/11/2020 Belinda [name] [id number] 16-Aug-1950  Surgeon(s): Angelia Mould, MD  Procedure(s): INSERTION OF RIGHT THIGH ARTERIOVENOUS (AV) GORE-TEX GRAFT   May stick graft immediately   May stick graft on designated area only:   X Do not stick right thigh AV graft for 4  weeks    If you have any questions, please call the office at [phone].

## 2020-08-11 NOTE — Anesthesia Procedure Notes (Signed)
Procedure Name: LMA Insertion Date/Time: 08/11/2020 7:37 AM Performed by: Mariea Clonts, CRNA Pre-anesthesia Checklist: Patient identified, Emergency Drugs available, Suction available and Patient being monitored Patient Re-evaluated:Patient Re-evaluated prior to induction Oxygen Delivery Method: Circle System Utilized Preoxygenation: Pre-oxygenation with 100% oxygen Induction Type: IV induction Ventilation: Mask ventilation without difficulty LMA: LMA inserted LMA Size: 4.0 Number of attempts: 1 Airway Equipment and Method: Bite block Placement Confirmation: positive ETCO2 Tube secured with: Tape Dental Injury: Teeth and Oropharynx as per pre-operative assessment

## 2020-08-12 ENCOUNTER — Encounter (HOSPITAL_COMMUNITY): Payer: Self-pay | Admitting: Vascular Surgery

## 2020-08-20 ENCOUNTER — Ambulatory Visit (INDEPENDENT_AMBULATORY_CARE_PROVIDER_SITE_OTHER): Payer: Medicare Other | Admitting: Physician Assistant

## 2020-08-20 ENCOUNTER — Other Ambulatory Visit: Payer: Self-pay

## 2020-08-20 VITALS — BP 129/69 | HR 81 | Temp 98.1°F | Resp 20 | Ht 60.0 in | Wt 148.1 lb

## 2020-08-20 DIAGNOSIS — N186 End stage renal disease: Secondary | ICD-10-CM

## 2020-08-20 NOTE — Progress Notes (Signed)
POST OPERATIVE OFFICE NOTE    CC:  F/u for surgery  HPI:  This is a 70 y.o. female who is s/p right thigh AVG on 08/11/2020 by Dr. Scot Dock   Pt returns today for follow up.  She states that she has been doing well.  Her only complaint is some soreness at the incision at the groin.  She denies any redness or drainage.  She does not have any foot pain on the right.   The pt is on dialysis T/T/S at [facility] location.  She is currently dialyzing via [facility] that was placed in April of 2021 by [facility]   Allergies  Allergen Reactions  . Doxycycline Other (See Comments)  . Entresto [Sacubitril-Valsartan] Itching  . Metformin Other (See Comments)  . Omnicef [Cefdinir] Other (See Comments)    GI Upset  . Aspirin Other (See Comments)    GI issues, Can tolerate low aspirin  . Augmentin [Amoxicillin-Pot Clavulanate] Nausea And Vomiting and Other (See Comments)    GI issues DID THE REACTION INVOLVE: Swelling of the face/tongue/throat, SOB, or low BP? No Sudden or severe rash/hives, skin peeling, or the inside of the mouth or nose? No Did it require medical treatment? No When did it last happen?long time  If all above answers are "NO", may proceed with cephalosporin use.   . Erythromycin Nausea And Vomiting and Other (See Comments)    Gi issues  . Gabapentin Swelling  . Nickel Itching and Rash    Breakouts     Current Outpatient Medications  Medication Sig Dispense Refill  . acetaminophen (TYLENOL) 650 MG CR tablet Take 650-1,300 mg by mouth every 8 (eight) hours as needed for pain (pain).     . Alpha-D-Galactosidase (BEANO PO) Take 1-2 tablets by mouth daily as needed (gas). With certain foods.    [name] aspirin EC 81 MG tablet Take 81 mg by mouth 3 (three) times a week. Swallow whole.    . Azelastine HCl 0.15 % SOLN Place 2 sprays into both nostrils daily.    . budesonide-formoterol (SYMBICORT) 160-4.5 MCG/ACT inhaler Inhale 2 puffs into the lungs 2 (two) times daily as needed  (cough/respiratory issues.).     [name] diclofenac sodium (VOLTAREN) 1 % GEL Apply 1 application topically 4 (four) times daily as needed (neck pain).     [name] ezetimibe-simvastatin (VYTORIN) 10-40 MG per tablet Take 1 tablet by mouth at bedtime.     . ferric citrate (AURYXIA) 1 GM 210 MG(Fe) tablet Take 210-630 mg by mouth See admin instructions. Take 3 tablets (630 mg) by mouth 3 times daily with meals & take 1 tablet (210 mg) by mouth with each snack    . fexofenadine (ALLEGRA) 180 MG tablet Take 180 mg by mouth daily.    [name] guaiFENesin (MUCINEX) 600 MG 12 hr tablet Take 600 mg by mouth in the morning and at bedtime.     . insulin lispro (HUMALOG) 100 UNIT/ML injection Inject 4-10 Units into the skin 3 (three) times daily before meals. Sliding Scale    . ketotifen (ZADITOR) 0.025 % ophthalmic solution Place 1 drop into both eyes 2 (two) times daily as needed (allergy/dry/irritated eyes.).    [name] LANTUS SOLOSTAR 100 UNIT/ML Solostar Pen Inject 18 Units into the skin at bedtime.   3  . losartan (COZAAR) 25 MG tablet Take 12.5 mg by mouth daily.    . Magnesium 200 MG TABS Take 0.5 tablets (100 mg total) by mouth 2 (two) times daily.    [name]  Methoxy PEG-Epoetin Beta (MIRCERA IJ) Mircera    . multivitamin (RENA-VIT) TABS tablet Take 1 tablet by mouth daily with breakfast.     . nitroGLYCERIN (NITROSTAT) 0.4 MG SL tablet Place 1 tablet (0.4 mg total) under the tongue every 5 (five) minutes x 3 doses as needed for chest pain. 6 tablet 1  . oxyCODONE-acetaminophen (PERCOCET) 5-325 MG tablet Take 1 tablet by mouth every 4 (four) hours as needed for severe pain. 20 tablet 0  . simethicone (MYLICON) 80 MG chewable tablet Chew 160 mg by mouth daily as needed for flatulence (indigestion.).      No current facility-administered medications for this visit.     ROS:  See HPI  Physical Exam:  Today's Vitals   08/20/20 1106  BP: 129/69  Pulse: 81  Resp: 20  Temp: 98.1 F (36.7 C)  TempSrc: Temporal  SpO2: 100%   Weight: 148 lb 1.6 oz (67.2 kg)  Height: 5' (1.524 m)  PainSc: 4   PainLoc: Leg   Body mass index is 28.92 kg/m.   Incision:  Right groin healed with hematoma; no evidence of infection.  Right medial thigh incision and distal incision also healed Extremities:   There is a palpable right DP pulse.   Motor and sensory are in tact.   There is a thrill/bruit present.  The fistula/graft is easily palpable     Assessment/Plan:  This is a 70 y.o. female who is s/p: right thigh AVG on 08/11/2020 by Dr. Scot Dock    -the pt does not have evidence of steal. -the fistula/graft can be used 09/08/2020. -If pt has a tunneled dialysis catheter and the access has been used successfully to the satisfaction of the dialysis center, the tunneled catheter can be scheduled to be removed at their discretion.   -the pt will follow up as needed   Leontine Locket, [facility] [phone]  Clinic MD:  Scot Dock

## 2020-09-15 ENCOUNTER — Ambulatory Visit (INDEPENDENT_AMBULATORY_CARE_PROVIDER_SITE_OTHER): Payer: Medicare Other | Admitting: Internal Medicine

## 2020-09-15 ENCOUNTER — Other Ambulatory Visit: Payer: Self-pay

## 2020-09-15 ENCOUNTER — Encounter: Payer: Self-pay | Admitting: Internal Medicine

## 2020-09-15 VITALS — BP 160/76 | HR 97 | Ht 60.0 in | Wt 153.2 lb

## 2020-09-15 DIAGNOSIS — I5022 Chronic systolic (congestive) heart failure: Secondary | ICD-10-CM | POA: Diagnosis not present

## 2020-09-15 DIAGNOSIS — E1165 Type 2 diabetes mellitus with hyperglycemia: Secondary | ICD-10-CM

## 2020-09-15 DIAGNOSIS — E1129 Type 2 diabetes mellitus with other diabetic kidney complication: Secondary | ICD-10-CM | POA: Diagnosis not present

## 2020-09-15 DIAGNOSIS — I10 Essential (primary) hypertension: Secondary | ICD-10-CM

## 2020-09-15 DIAGNOSIS — N186 End stage renal disease: Secondary | ICD-10-CM

## 2020-09-15 DIAGNOSIS — IMO0002 Reserved for concepts with insufficient information to code with codable children: Secondary | ICD-10-CM

## 2020-09-15 NOTE — Progress Notes (Signed)
OFFICE NOTE  Chief Complaint:  Follow-up heart failure  Primary Care Physician: Reynold Bowen, MD  HPI:  Belinda [name] is a 70 y.o. female with a past medial history significant for palpitations and prior PVCs.  She was formerly followed by Dr. Mare Ferrari and Dr. Acie Fredrickson.  Recently in September she presented to [facility] urgent care with shortness of breath and was thought to have a pneumonia.  She was treated with antibiotics however her chest x-ray indicated mild to moderate cardiomegaly and mild congestive heart failure pattern with a superimposed bibasilar infiltrate.  She was instructed to follow-up with cardiology as there was concern for possible new congestive heart failure.  She has a long-standing history of hypertension as well as insulin-dependent diabetes followed by Dr. Forde Dandy.  In 2016 she underwent left knee replacement and had a nuclear stress test prior to that which showed normal perfusion.  She denies any new chest pain.  She does report getting short of breath with exertion.  She also recently has had some wheezing.  Her primary care provider put her on some Symbicort which seems to have helped somewhat with her symptoms as it was felt to be allergic.  She also reports some lower extremity edema.  Additionally, she has stage III chronic kidney disease and is followed by Dr. Hollie Salk with [facility].  05/27/2017  Belinda [name] returns today for follow-up.  She reports her breathing has improved.  We made some adjustments to her medications, including discontinuing hydrochlorothiazide.  She is now on torsemide 20 mg daily.  Echo shows her LVEF at 25-30% as expected. Initially her creatinine was rising somewhat, however, she has diuresed.  Blood pressure remains elevated.  We discussed options for additional heart failure management, including switching her losartan over to [facility].  She may need additional medication for blood pressure as well.  08/30/2017  Mrs.  [name] was seen today in follow-up.  She recently saw Dr. Hollie Salk at [facility].  She was noted to have significant weight gain and acute systolic congestive heart failure.  Her weight had gone up to 279 pounds.  She been struggling with influenza and pneumonia and received steroids and antibiotics.  This likely played a role in her weight gain.  She was advised to increase her torsemide to 20 mg twice daily.  Since then she has had significant diuresis.  Her weight today is down to 167 pounds.  She reports improvement in her breathing.  She is tolerated Entresto with a moderate dose and we discussed possibly increasing that today.  She reports very infrequent episodes of hypotension with systolic blood pressure around 100 but in general her systolic blood pressures are around 135 to 145.  11/25/2017  Belinda [name] returns today for follow-up of heart failure.  She reports that she is not felt as well on the high-dose Entresto.  In fact she started taking the medication once daily.  I reminded her that there is no evidence of benefit on once daily dosing, that I am aware of.  Typically if she is having side effects on the high dose of medication we would recommend decreasing the dose.  She also noted her blood pressure had been elevated although it is only top normal today at 138/90.  She denies any worsening shortness of breath.  Weight is remained stable around 166 pounds.  She does have occasional lower extremity swelling which improves with elevation of her feet.  She is interested in compression stockings.  09/15/2020  Belinda [name] is seen today for follow-up of heart failure.  I last saw her in 2019.  Shortly after that she was seen by Dr. Haroldine Laws in the heart failure clinic and has been followed regularly there.  Unfortunately she is started on dialysis in the interim.  She has been taken off of nearly all of her heart failure medicines except for low-dose losartan.  She struggled with labile  hypertension.  Blood pressure was very high today however at dialysis at times her blood pressure is very low even down to the 66Q systolic.  She says that it limits her ultrafiltration as she feels like she is carrying more fluid.  Indeed her weight has been up about 25 pounds since she saw Dr. Haroldine Laws last September.  She does have a dialysis fistula in her right thigh which was placed recently and a temporary catheter which is due to be removed.  She is scheduled to have an echo in April and see Dr. Haroldine Laws thereafter.  PMHx:  Past Medical History:  Diagnosis Date  . Acute CHF (congestive heart failure) ([place]) 02/02/2018  . Anemia    "get monthly injections to boost my HgB" (02/02/2018)  . Anxiety   . Asthma   . Back pain    intractable low back  . Chronic neck pain   . CKD (chronic kidney disease), stage III ([place])    on dialysis [employer]  . Depression   . Dysrhythmia    palpitations or heart racing periodic. has had for years- Dr Mare Ferrari follows.  [name] GERD (gastroesophageal reflux disease)   . History of blood transfusion 2016   After knee replacemnet  . Hx MRSA infection   . Hyperlipidemia   . Hypertension   . Migraines    "~ 2/month" (02/02/2018), gets headaches with dialysis  . Nerve damage    right hand  . Osteoarthritis    "knees, neck" (02/02/2018)  . Palpitations   . Pneumonia    "twice in 2018" (02/02/2018)  . Pulmonary artery hypertension ([place])   . Seasonal allergies   . Type II diabetes mellitus (HCC)    insulin dependent - fasting 140-200     Past Surgical History:  Procedure Laterality Date  . ABDOMINAL HYSTERECTOMY  03/2001   Abdominal supracervical hysterectomy, left salpingo-oophorectomy  . ANTERIOR CERVICAL DECOMP/DISCECTOMY FUSION N/A 01/01/2014   Procedure: CERVICAL THREE-FOUR,CERVICAL FIVE-SIX,CERVICAL SIX-SEVEN ANTERIOR CERVICAL DECOMPRESSION/DISCECTOMY/FUSION;  Surgeon: Kristeen Miss, MD;  Location: [place] [facility];  Service: Neurosurgery;   Laterality: N/A;  left-side approach  . AV FISTULA PLACEMENT Right 02/13/2020   Procedure: ARTERIOVENOUS (AV) FISTULA CREATION;  Surgeon: Waynetta Sandy, MD;  Location: [place];  Service: Vascular;  Laterality: Right;  . AV FISTULA PLACEMENT Right 08/11/2020   Procedure: INSERTION OF RIGHT THIGH ARTERIOVENOUS (AV) GORE-TEX GRAFT;  Surgeon: Angelia Mould, MD;  Location: [place];  Service: Vascular;  Laterality: Right;  . BACK SURGERY    . BASCILIC VEIN TRANSPOSITION Right 04/16/2020   Procedure: BASCILIC VEIN TRANSPOSITION SECOND STAGE RIGHT;  Surgeon: Waynetta Sandy, MD;  Location: [place];  Service: Vascular;  Laterality: Right;  . CATARACT EXTRACTION W/ INTRAOCULAR LENS  IMPLANT, BILATERAL Bilateral 2012  . CESAREAN SECTION    . COLONOSCOPY W/ POLYPECTOMY    . DILATION AND CURETTAGE OF UTERUS  X 2  . ESOPHAGOGASTRODUODENOSCOPY    . HAND SURGERY Left 1970s   laceration repair 4th and 5th digits  . KNEE ARTHROSCOPY Left    torn mensicus  .  LIGATION OF COMPETING BRANCHES OF ARTERIOVENOUS FISTULA Right 05/07/2020   Procedure: LIGATION OF RIGHT UPPER EXTREMITY FISTULA;  Surgeon: Waynetta Sandy, MD;  Location: [place];  Service: Vascular;  Laterality: Right;  . NASAL SINUS SURGERY    . RIGHT HEART CATH N/A 06/23/2018   Procedure: RIGHT HEART CATH;  Surgeon: Jolaine Artist, MD;  Location: [place] [facility];  Service: Cardiovascular;  Laterality: N/A;  . TOTAL KNEE ARTHROPLASTY Left 02/12/2015   Procedure: TOTAL KNEE ARTHROPLASTY;  Surgeon: [name], MD;  Location: [place];  Service: Orthopedics;  Laterality: Left;    FAMHx:  Family History  Problem Relation Age of Onset  . Heart disease Mother   . Heart failure Father   . Colon cancer Neg Hx   . Stomach cancer Neg Hx   . Esophageal cancer Neg Hx     SOCHx:   reports that she has never smoked. She has never used smokeless tobacco. She reports that she does not drink alcohol and does not use  drugs.  ALLERGIES:  Allergies  Allergen Reactions  . Doxycycline Other (See Comments)  . Entresto [Sacubitril-Valsartan] Itching  . Metformin Other (See Comments)  . Omnicef [Cefdinir] Other (See Comments)    GI Upset  . Aspirin Other (See Comments)    GI issues, Can tolerate low aspirin  . Augmentin [Amoxicillin-Pot Clavulanate] Nausea And Vomiting and Other (See Comments)    GI issues DID THE REACTION INVOLVE: Swelling of the face/tongue/throat, SOB, or low BP? No Sudden or severe rash/hives, skin peeling, or the inside of the mouth or nose? No Did it require medical treatment? No When did it last happen?long time  If all above answers are "NO", may proceed with cephalosporin use.   . Erythromycin Nausea And Vomiting and Other (See Comments)    Gi issues  . Gabapentin Swelling  . Nickel Itching and Rash    Breakouts     ROS: Pertinent items noted in HPI and remainder of comprehensive ROS otherwise negative.  HOME MEDS: Current Outpatient Medications on File Prior to Visit  Medication Sig Dispense Refill  . acetaminophen (TYLENOL) 650 MG CR tablet Take 650-1,300 mg by mouth every 8 (eight) hours as needed for pain (pain).     . Alpha-D-Galactosidase (BEANO PO) Take 1-2 tablets by mouth daily as needed (gas). With certain foods.    [name] aspirin EC 81 MG tablet Take 81 mg by mouth 3 (three) times a week. Swallow whole.    . Azelastine HCl 0.15 % SOLN Place 2 sprays into both nostrils daily.    . budesonide-formoterol (SYMBICORT) 160-4.5 MCG/ACT inhaler Inhale 2 puffs into the lungs 2 (two) times daily as needed (cough/respiratory issues.).     [name] diclofenac sodium (VOLTAREN) 1 % GEL Apply 1 application topically 4 (four) times daily as needed (neck pain).     [name] ezetimibe-simvastatin (VYTORIN) 10-40 MG per tablet Take 1 tablet by mouth at bedtime.     . ferric citrate (AURYXIA) 1 GM 210 MG(Fe) tablet Take 210-630 mg by mouth See admin instructions. Take 3 tablets (630 mg) by  mouth 3 times daily with meals & take 1 tablet (210 mg) by mouth with each snack    . fexofenadine (ALLEGRA) 180 MG tablet Take 180 mg by mouth daily.    [name] guaiFENesin (MUCINEX) 600 MG 12 hr tablet Take 600 mg by mouth in the morning and at bedtime.     . insulin lispro (HUMALOG) 100 UNIT/ML injection Inject 4-10  Units into the skin 3 (three) times daily before meals. Sliding Scale    . ketotifen (ZADITOR) 0.025 % ophthalmic solution Place 1 drop into both eyes 2 (two) times daily as needed (allergy/dry/irritated eyes.).    [name] LANTUS SOLOSTAR 100 UNIT/ML Solostar Pen Inject 18 Units into the skin at bedtime.   3  . losartan (COZAAR) 25 MG tablet Take 12.5 mg by mouth daily.    . Magnesium 200 MG TABS Take 0.5 tablets (100 mg total) by mouth 2 (two) times daily.    . Methoxy PEG-Epoetin Beta (MIRCERA IJ) Mircera    . multivitamin (RENA-VIT) TABS tablet Take 1 tablet by mouth daily with breakfast.     . nitroGLYCERIN (NITROSTAT) 0.4 MG SL tablet Place 1 tablet (0.4 mg total) under the tongue every 5 (five) minutes x 3 doses as needed for chest pain. 6 tablet 1  . oxyCODONE-acetaminophen (PERCOCET) 5-325 MG tablet Take 1 tablet by mouth every 4 (four) hours as needed for severe pain. 20 tablet 0  . simethicone (MYLICON) 80 MG chewable tablet Chew 160 mg by mouth daily as needed for flatulence (indigestion.).      No current facility-administered medications on file prior to visit.    LABS/IMAGING: No results found for this or any previous visit (from the past 48 hour(s)). No results found.  LIPID PANEL: No results found for: CHOL, TRIG, HDL, CHOLHDL, VLDL, LDLCALC, LDLDIRECT   WEIGHTS: Wt Readings from Last 3 Encounters:  09/15/20 153 lb 3.2 oz (69.5 kg)  08/20/20 148 lb 1.6 oz (67.2 kg)  08/11/20 145 lb (65.8 kg)    VITALS: BP (!) 160/76 (BP Location: Left Arm, Patient Position: Sitting, Cuff Size: Normal)   Pulse 97   Ht 5' (1.524 m)   Wt 153 lb 3.2 oz (69.5 kg)   SpO2 96%   BMI  29.92 kg/m   EXAM: General appearance: alert and no distress Neck: no carotid bruit, no JVD and thyroid not enlarged, symmetric, no tenderness/mass/nodules Lungs: clear to auscultation bilaterally Heart: regular rate and rhythm Abdomen: soft, non-tender; bowel sounds normal; no masses,  no organomegaly Extremities: edema 1+ edema Pulses: 2+ and symmetric Skin: Skin color, texture, turgor normal. No rashes or lesions Neurologic: Grossly normal Psych: Pleasant  EKG: Normal sinus rhythm at 97, low voltage QRS-personally reviewed  ASSESSMENT: 1. Chronic systolic congestive heart failure, NYHA class II symptoms-LVEF 25-30% 2. 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 Insulin-dependent diabetes - last A1c 9.3% 4. Dyslipidemia 5. Anemia 6. Palpitations/PVCs  PLAN: 1.   Belinda [name] has had weight gain since last being seen in the heart failure clinic.  She says that hypotension at dialysis has limited her ability to take off more fluid.  She does not take losartan on dialysis days.  She is scheduled for repeat echo next month and then follow-up with Dr. Haroldine Laws.  I wonder if she may need to be on midodrine to support her dialysis.  Blood pressure has been labile will not probably allow an increase in losartan.  Will defer further management to Dr. Haroldine Laws.  Her A1c remains elevated.  She is on Humalog sliding scale.  Unfortunately, with her end-stage renal disease she cannot use an SGLT2 inhibitor.  Pixie Casino, MD, [facility], [place] Director of the Advanced Lipid Disorders &  Cardiovascular Risk Reduction Clinic Attending Cardiologist  Direct Dial: [phone]  Fax: [phone]  Website:  [email or web address] 09/15/2020, 3:58 PM

## 2020-09-15 NOTE — Patient Instructions (Signed)
Medication Instructions:  Your physician recommends that you continue on your current medications as directed. Please refer to the Current Medication list given to you today.  *If you need a refill on your cardiac medications before your next appointment, please call your pharmacy*   Follow-Up: At [facility], you and your health needs are our priority.  As part of our continuing mission to provide you with exceptional heart care, we have created designated Provider Care Teams.  These Care Teams include your primary Cardiologist (physician) and Advanced Practice Providers (APPs -  Physician Assistants and Nurse Practitioners) who all work together to provide you with the care you need, when you need it.  We recommend signing up for the patient portal called "MyChart".  Sign up information is provided on this After Visit Summary.  MyChart is used to connect with patients for Virtual Visits (Telemedicine).  Patients are able to view lab/test results, encounter notes, upcoming appointments, etc.  Non-urgent messages can be sent to your provider as well.   To learn more about what you can do with MyChart, go to [email or web address].    Your next appointment:   2 year(s)  The format for your next appointment:   In Person  Provider:   You may see Pixie Casino, MD or one of the following Advanced Practice Providers on your designated Care Team:    Almyra Deforest, PA-C  Fabian Sharp, PA-C or   Roby Lofts, [place]    Other Instructions

## 2020-10-02 ENCOUNTER — Encounter (HOSPITAL_COMMUNITY): Payer: Medicare Other | Admitting: Internal Medicine

## 2020-10-02 ENCOUNTER — Other Ambulatory Visit (HOSPITAL_COMMUNITY): Payer: Medicare Other

## 2020-10-14 NOTE — Progress Notes (Signed)
Advanced Heart Failure Clinic Note   PCP: Reynold Bowen, MD PCP-Cardiologist: Pixie Casino, MD  Nephrologist: Dr Hollie Salk  HPI: Belinda [name] is a 70 y.o. 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 AHF team consulted. Diuresed 40 lbs with lasix drip and metolazone. Myeloma panel and PYP scan negative. Myoview as below. No LHC with elevated creatinine. She had AKI with creatinine peak 3.1. She was also treated for a UTI. HF meds adjusted as able, limited by CKD. DC weight: 161 lbs  RHC 12/19 RA = 4 RV = 57/8 PA = 57/26 (40) PCW = 15 Fick cardiac output/index = 5.0/3.2 Thermo CO/CI = 6.22/3.9 PVR = 5.0 WU Ao sat = 96% PA sat = 69%, 71%  She presents today for routine follow up. On HD since 11/01/19. Had problems with graft in RUE. R hand still weak. Now with graft in R thigh. Breathing ok. Edema fairly well controlled with HD. BP can be high on non-HD days and will take losartan 12.5 as needed. BP has been running low with HD and taking midodrine 10mg  prior to HD. No CP, orthopnea or PND.   Echo today EF 60-65% RV ok Personally reviewed   Studies:  PFTS 7/20  FEV1 1.28 (77%) FVC 1.58 (74%)  DLCO 53%   Echo 2/20: EF 25-30% mild to moderate MR RVSP 59. RV ok.   Echo 11/19 EF 25-30% mild MR  Echo (02/07/18) EF 45-50% with mild RV HK. Moderate TR. Small to moderate pericardial effusion echo texture suggestive of possible amyloid.    ABD [place] 04/21/18 with ? Elevated R heart pressures with distended IVC and hepatic veins. Small amount of perihepatic ascites and R medical renal disease.    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  Myeloma Panel negative.   Myoview scan reviewed by Dr Haroldine Laws and notable for mild to moderate anterior ischemia - cannot exclude breast attenuation. Given lack  of CP and creatinine ~3 will treat medically.  FH: CAD in mother, CHF in father  SH: No ETOH, tobacco, or drug use   Review of systems complete and found to be negative unless listed in HPI.    Past Medical History:  Diagnosis Date  . Acute CHF (congestive heart failure) ([place]) 02/02/2018  . Anemia    "get monthly injections to boost my HgB" (02/02/2018)  . Anxiety   . Asthma   . Back pain    intractable low back  . Chronic neck pain   . CKD (chronic kidney disease), stage III ([place])    on dialysis T/Th/[employer]  . Depression   . Dysrhythmia    palpitations or heart racing periodic. has had for years- Dr Mare Ferrari follows.  [name] GERD (gastroesophageal reflux disease)   . History of blood transfusion 2016   After knee replacemnet  . Hx MRSA infection   . Hyperlipidemia   . Hypertension   . Migraines    "~ 2/month" (02/02/2018), gets headaches with dialysis  . Nerve damage    right hand  . Osteoarthritis    "knees, neck" (02/02/2018)  . Palpitations   . Pneumonia    "twice in 2018" (02/02/2018)  . Pulmonary artery hypertension ([place])   . Seasonal allergies   . Type II diabetes mellitus ([facility])    insulin dependent - fasting 140-200  Current Outpatient Medications  Medication Sig Dispense Refill  . acetaminophen (TYLENOL) 650 MG CR tablet Take 650-1,300 mg by mouth every 8 (eight) hours as needed for pain (pain).     . Alpha-D-Galactosidase (BEANO PO) Take 1-2 tablets by mouth daily as needed (gas). With certain foods.    [name] aspirin EC 81 MG tablet Take 81 mg by mouth 3 (three) times a week. Swallow whole.    . Azelastine HCl 0.15 % SOLN Place 2 sprays into both nostrils daily.    . budesonide-formoterol (SYMBICORT) 160-4.5 MCG/ACT inhaler Inhale 2 puffs into the lungs 2 (two) times daily as needed (cough/respiratory issues.).     [name] diclofenac sodium (VOLTAREN) 1 % GEL Apply 1 application topically 4 (four) times daily as needed (neck pain).     [name] ezetimibe-simvastatin  (VYTORIN) 10-40 MG per tablet Take 1 tablet by mouth at bedtime.     . ferric citrate (AURYXIA) 1 GM 210 MG(Fe) tablet Take 210-630 mg by mouth See admin instructions. Take 3 tablets (630 mg) by mouth 3 times daily with meals & take 1 tablet (210 mg) by mouth with each snack    . fexofenadine (ALLEGRA) 180 MG tablet Take 180 mg by mouth daily.    [name] guaiFENesin (MUCINEX) 600 MG 12 hr tablet Take 600 mg by mouth in the morning and at bedtime.     . insulin lispro (HUMALOG) 100 UNIT/ML injection Inject 4-10 Units into the skin 3 (three) times daily before meals. Sliding Scale    . IRON SUCROSE IV Inject 1 Dose into the vein as needed.    [name] ketotifen (ZADITOR) 0.025 % ophthalmic solution Place 1 drop into both eyes 2 (two) times daily as needed (allergy/dry/irritated eyes.).    [name] LANTUS SOLOSTAR 100 UNIT/ML Solostar Pen Inject 18 Units into the skin at bedtime.   3  . losartan (COZAAR) 25 MG tablet Take 12.5 mg by mouth daily.    . Magnesium 200 MG TABS Take 0.5 tablets (100 mg total) by mouth 2 (two) times daily.    . Methoxy PEG-Epoetin Beta (MIRCERA IJ) Mircera    . midodrine (PROAMATINE) 10 MG tablet Take 10 mg by mouth 3 (three) times a week. Pre dialysis    . multivitamin (RENA-VIT) TABS tablet Take 1 tablet by mouth daily with breakfast.     . nitroGLYCERIN (NITROSTAT) 0.4 MG SL tablet Place 1 tablet (0.4 mg total) under the tongue every 5 (five) minutes x 3 doses as needed for chest pain. 6 tablet 1  . simethicone (MYLICON) 80 MG chewable tablet Chew 160 mg by mouth daily as needed for flatulence (indigestion.).     [name] VITAMIN D, CHOLECALCIFEROL, PO Take by mouth 3 (three) times a week. With dialysis     No current facility-administered medications for this encounter.    Allergies  Allergen Reactions  . Doxycycline Other (See Comments)  . Entresto [Sacubitril-Valsartan] Itching  . Metformin Other (See Comments)  . Omnicef [Cefdinir] Other (See Comments)    GI Upset  . Aspirin Other (See  Comments)    GI issues, Can tolerate low aspirin  . Augmentin [Amoxicillin-Pot Clavulanate] Nausea And Vomiting and Other (See Comments)    GI issues DID THE REACTION INVOLVE: Swelling of the face/tongue/throat, SOB, or low BP? No Sudden or severe rash/hives, skin peeling, or the inside of the mouth or nose? No Did it require medical treatment? No When did it last happen?long time  If all above answers are "NO", may proceed  with cephalosporin use.   . Erythromycin Nausea And Vomiting and Other (See Comments)    Gi issues  . Gabapentin Swelling  . Nickel Itching and Rash    Breakouts       Social History   Socioeconomic History  . Marital status: Married    Spouse name: Not on file  . Number of children: 1  . Years of education: Not on file  . Highest education level: Not on file  Occupational History  . Not on file  Tobacco Use  . Smoking status: Never Smoker  . Smokeless tobacco: Never Used  Vaping Use  . Vaping Use: Never used  Substance and Sexual Activity  . Alcohol use: Never  . Drug use: Never  . Sexual activity: Not Currently  Other Topics Concern  . Not on file  Social History Narrative  . Not on file   Social Determinants of Health   Financial Resource Strain: Not on file  Food Insecurity: Not on file  Transportation Needs: Not on file  Physical Activity: Not on file  Stress: Not on file  Social Connections: Not on file  Intimate Partner Violence: Not on file      Family History  Problem Relation Age of Onset  . Heart disease Mother   . Heart failure Father   . Colon cancer Neg Hx   . Stomach cancer Neg Hx   . Esophageal cancer Neg Hx    Vitals:   10/15/20 1508  BP: 140/78  Pulse: 87  SpO2: 98%  Weight: 68.4 kg (150 lb 12.8 oz)   Wt Readings from Last 3 Encounters:  10/15/20 68.4 kg (150 lb 12.8 oz)  09/15/20 69.5 kg (153 lb 3.2 oz)  08/20/20 67.2 kg (148 lb 1.6 oz)    PHYSICAL EXAM: General:  Sitting in chair No resp  difficulty HEENT: normal Neck: supple. JVP 7 Carotids 2+ bilat; no bruits. No lymphadenopathy or thryomegaly appreciated. Cor: PMI nondisplaced. Regular rate & rhythm. No rubs, gallops or murmurs. Lungs: clear Abdomen: soft, nontender, nondistended. No hepatosplenomegaly. No bruits or masses. Good bowel sounds. Extremities: no cyanosis, clubbing, rash, edema Neuro: alert & orientedx3, cranial nerves grossly intact. moves all 4 extremities w/o difficulty. Affect pleasant  ASSESSMENT & PLAN:  1. Chronic systolic HF with R>L symptoms - Etiology unclear. Echo suggestive of cardiac amyloid but w/u negative. Ischemia and hypertensive CM also possible.  - Myoview scan 8/19  notable for mild to moderate anterior ischemia - cannot exclude breast attenuation. Have been unable to cath with A/CKD IV - PYP scan 02/13/18 Grade 1, HCL 1.02 'Equivocal' for TTR amyloid. Myeloma panel negative.  - ECHO 11/18 EF 45-50% Grade IDD RV mildly dilated. Peak PA pressure 43 mm hg.  - ECHO 02/07/18 EF 25-30% - Echo 2/20 EF 25-30% with global HK - RHC 12/19  moderate PAH and normal PCWP. - Echo today 10/15/20 EF 60-65% RV ok Personally reviewed - Remains NYHA III  - Volume status now controlled by HD - Off most BP meds due to low BP with HD. Takes losartan 12.5 PRN for HTN - Now that she is on HD we previously discussed possibility of cath to exclude critical CAD as cause of her cardiomyopathy. However given lack of angina and fact that she is not a CABG candidate doubt this would change management much and she has opted to defer. Will reconsider if she has anginal symptoms.   2. Pulmonary HTN - Moderate by RHC in 2019.  - Echo  with normal RV today  - Does not qualify for selective pulmonary artery vasodilators  3. ESRD - Now on HD. Followed by Dr Joelyn Oms.  4. Probable OSA - Pending sleep study  5. HTN - Blood pressure now low on HD days but can climb on non-HD days, management as above  6.DM2 - Per PCP    With normalization of EF will graduate her from [facility] and have continued f/u with Dr. Debara Pickett.     Glori Bickers, MD 10/15/20

## 2020-10-15 ENCOUNTER — Ambulatory Visit (HOSPITAL_BASED_OUTPATIENT_CLINIC_OR_DEPARTMENT_OTHER)
Admission: RE | Admit: 2020-10-15 | Discharge: 2020-10-15 | Disposition: A | Discharge status: Home / Self Care | Payer: Medicare Other | Source: Ambulatory Visit | Attending: Internal Medicine | Admitting: Internal Medicine

## 2020-10-15 ENCOUNTER — Ambulatory Visit (HOSPITAL_COMMUNITY)
Admission: RE | Admit: 2020-10-15 | Discharge: 2020-10-15 | Disposition: A | Discharge status: Home / Self Care | Payer: Medicare Other | Source: Ambulatory Visit | Attending: Internal Medicine | Admitting: Internal Medicine

## 2020-10-15 ENCOUNTER — Other Ambulatory Visit: Payer: Self-pay

## 2020-10-15 ENCOUNTER — Encounter (HOSPITAL_COMMUNITY): Payer: Self-pay | Admitting: Internal Medicine

## 2020-10-15 VITALS — BP 140/78 | HR 87 | Wt 150.8 lb

## 2020-10-15 DIAGNOSIS — N186 End stage renal disease: Secondary | ICD-10-CM

## 2020-10-15 DIAGNOSIS — I5022 Chronic systolic (congestive) heart failure: Secondary | ICD-10-CM

## 2020-10-15 DIAGNOSIS — I132 Hypertensive heart and chronic kidney disease with heart failure and with stage 5 chronic kidney disease, or end stage renal disease: Secondary | ICD-10-CM | POA: Insufficient documentation

## 2020-10-15 DIAGNOSIS — Z992 Dependence on renal dialysis: Secondary | ICD-10-CM | POA: Diagnosis not present

## 2020-10-15 DIAGNOSIS — E785 Hyperlipidemia, unspecified: Secondary | ICD-10-CM | POA: Diagnosis not present

## 2020-10-15 DIAGNOSIS — Z794 Long term (current) use of insulin: Secondary | ICD-10-CM | POA: Insufficient documentation

## 2020-10-15 DIAGNOSIS — I272 Pulmonary hypertension, unspecified: Secondary | ICD-10-CM | POA: Insufficient documentation

## 2020-10-15 DIAGNOSIS — Z8249 Family history of ischemic heart disease and other diseases of the circulatory system: Secondary | ICD-10-CM | POA: Diagnosis not present

## 2020-10-15 DIAGNOSIS — Z79899 Other long term (current) drug therapy: Secondary | ICD-10-CM | POA: Diagnosis not present

## 2020-10-15 DIAGNOSIS — E1122 Type 2 diabetes mellitus with diabetic chronic kidney disease: Secondary | ICD-10-CM | POA: Insufficient documentation

## 2020-10-15 DIAGNOSIS — I10 Essential (primary) hypertension: Secondary | ICD-10-CM | POA: Diagnosis not present

## 2020-10-15 DIAGNOSIS — Z7951 Long term (current) use of inhaled steroids: Secondary | ICD-10-CM | POA: Insufficient documentation

## 2020-10-15 DIAGNOSIS — Z7982 Long term (current) use of aspirin: Secondary | ICD-10-CM | POA: Insufficient documentation

## 2020-10-15 DIAGNOSIS — I5042 Chronic combined systolic (congestive) and diastolic (congestive) heart failure: Secondary | ICD-10-CM | POA: Insufficient documentation

## 2020-10-15 DIAGNOSIS — R739 Hyperglycemia, unspecified: Secondary | ICD-10-CM | POA: Diagnosis not present

## 2020-10-15 LAB — ECHOCARDIOGRAM COMPLETE
Area-P 1/2: 8.82 cm2
Calc EF: 56.1 %
MV VTI: 2.1 cm2
S' Lateral: 2.8 cm
Single Plane A2C EF: 58.1 %
Single Plane A4C EF: 53 %

## 2020-10-15 NOTE — Addendum Note (Signed)
Encounter addended by: Scarlette Calico, RN on: 10/15/2020 4:06 PM  Actions taken: Visit diagnoses modified, Order list changed, Diagnosis association updated

## 2020-10-15 NOTE — Patient Instructions (Signed)
CONGRATULATIONS!!!! You have graduated the [facility]  Please follow up with Dr Debara Pickett in 6 months (October 2022), his office will call you for an appointment

## 2020-10-15 NOTE — Addendum Note (Signed)
Encounter addended by: Scarlette Calico, RN on: 10/15/2020 3:58 PM  Actions taken: Clinical Note Signed, Specialty comments modified

## 2020-10-15 NOTE — Progress Notes (Signed)
  Echocardiogram 2D Echocardiogram has been performed.  Belinda [name] 10/15/2020, 2:47 PM

## 2020-12-03 ENCOUNTER — Other Ambulatory Visit: Payer: Self-pay | Admitting: Endocrinology

## 2020-12-03 DIAGNOSIS — Z1231 Encounter for screening mammogram for malignant neoplasm of breast: Secondary | ICD-10-CM

## 2020-12-10 ENCOUNTER — Encounter: Payer: Self-pay | Admitting: Gastroenterology

## 2020-12-10 ENCOUNTER — Telehealth: Payer: Self-pay | Admitting: Gastroenterology

## 2020-12-10 NOTE — Telephone Encounter (Signed)
Spoke with patient, she states that she does not have any upper GI issues. She states that she does get indigestion sometimes when she eats certain things. If she gets indigestion she will usually take Gas-X or Beano. Patient is wondering if EGD can be added to scheduled colonoscopy. Patient states that she is also on the kidney transplant list and thinks EGD may be required. Advised patient that she would have to get that information from her nurse navigator who is assisting her, she stated that her first class would be 7/6. Advised that she may need an OV for evaluation but will defer to Dr. Havery Moros. Please advise, thanks.

## 2020-12-10 NOTE — Telephone Encounter (Signed)
Thank you [place] for the update. I am not aware of this being a requirement for renal transplant but she should call the transplant office and ask. If they require it then happy to do it for her. However, she had an EGD In 2018 which looked okay. Small hiatal hernia. Occasional indigestion is usually not a indication for this exam unless she fails medications or has routine symptoms. I don't think she needs one based on her symptoms reported as you describe them, however if she wants to discuss further with me I am happy to see her in the office. Thanks

## 2020-12-10 NOTE — Telephone Encounter (Signed)
Inbound call from pt stating she wanted to know if she can have an EGD added to her procedure. Please advise

## 2020-12-11 NOTE — Telephone Encounter (Signed)
Spoke with patient in regards to Dr. Doyne Keel recommendations. Patient states that her symptoms haven't worsened so she does not think she needs the EGD. Patient verbalized understanding and had no concerns at the end of the call.

## 2020-12-11 NOTE — Telephone Encounter (Signed)
Inbound call from pt stating that she was returning your phone call. I did let her know that it will be an unknown number calling back. Thanks.

## 2020-12-11 NOTE — Telephone Encounter (Signed)
Lm on vm for patient to return call 

## 2021-01-17 ENCOUNTER — Other Ambulatory Visit: Payer: Self-pay

## 2021-01-17 ENCOUNTER — Emergency Department (HOSPITAL_COMMUNITY): Payer: Medicare Other

## 2021-01-17 ENCOUNTER — Inpatient Hospital Stay (HOSPITAL_COMMUNITY)
Admission: EM | Admit: 2021-01-17 | Discharge: 2021-01-25 | DRG: 871 | Disposition: A | Discharge status: Home Health | Payer: Medicare Other | Attending: Internal Medicine | Admitting: Internal Medicine

## 2021-01-17 DIAGNOSIS — Z9109 Other allergy status, other than to drugs and biological substances: Secondary | ICD-10-CM

## 2021-01-17 DIAGNOSIS — E1129 Type 2 diabetes mellitus with other diabetic kidney complication: Secondary | ICD-10-CM | POA: Diagnosis present

## 2021-01-17 DIAGNOSIS — I2721 Secondary pulmonary arterial hypertension: Secondary | ICD-10-CM | POA: Diagnosis present

## 2021-01-17 DIAGNOSIS — Z96652 Presence of left artificial knee joint: Secondary | ICD-10-CM | POA: Diagnosis present

## 2021-01-17 DIAGNOSIS — R509 Fever, unspecified: Secondary | ICD-10-CM

## 2021-01-17 DIAGNOSIS — R7881 Bacteremia: Secondary | ICD-10-CM | POA: Diagnosis present

## 2021-01-17 DIAGNOSIS — Z794 Long term (current) use of insulin: Secondary | ICD-10-CM

## 2021-01-17 DIAGNOSIS — Z886 Allergy status to analgesic agent status: Secondary | ICD-10-CM

## 2021-01-17 DIAGNOSIS — D631 Anemia in chronic kidney disease: Secondary | ICD-10-CM | POA: Diagnosis present

## 2021-01-17 DIAGNOSIS — I5022 Chronic systolic (congestive) heart failure: Secondary | ICD-10-CM | POA: Diagnosis present

## 2021-01-17 DIAGNOSIS — E871 Hypo-osmolality and hyponatremia: Secondary | ICD-10-CM | POA: Diagnosis present

## 2021-01-17 DIAGNOSIS — Z7982 Long term (current) use of aspirin: Secondary | ICD-10-CM

## 2021-01-17 DIAGNOSIS — I313 Pericardial effusion (noninflammatory): Secondary | ICD-10-CM | POA: Diagnosis present

## 2021-01-17 DIAGNOSIS — J9811 Atelectasis: Secondary | ICD-10-CM | POA: Diagnosis present

## 2021-01-17 DIAGNOSIS — R0902 Hypoxemia: Secondary | ICD-10-CM | POA: Diagnosis present

## 2021-01-17 DIAGNOSIS — K219 Gastro-esophageal reflux disease without esophagitis: Secondary | ICD-10-CM | POA: Diagnosis present

## 2021-01-17 DIAGNOSIS — G8929 Other chronic pain: Secondary | ICD-10-CM | POA: Diagnosis present

## 2021-01-17 DIAGNOSIS — I132 Hypertensive heart and chronic kidney disease with heart failure and with stage 5 chronic kidney disease, or end stage renal disease: Secondary | ICD-10-CM | POA: Diagnosis present

## 2021-01-17 DIAGNOSIS — N186 End stage renal disease: Secondary | ICD-10-CM | POA: Diagnosis present

## 2021-01-17 DIAGNOSIS — F32A Depression, unspecified: Secondary | ICD-10-CM | POA: Diagnosis present

## 2021-01-17 DIAGNOSIS — A408 Other streptococcal sepsis: Principal | ICD-10-CM | POA: Diagnosis present

## 2021-01-17 DIAGNOSIS — Z9115 Patient's noncompliance with renal dialysis: Secondary | ICD-10-CM

## 2021-01-17 DIAGNOSIS — R7989 Other specified abnormal findings of blood chemistry: Secondary | ICD-10-CM | POA: Diagnosis present

## 2021-01-17 DIAGNOSIS — E1122 Type 2 diabetes mellitus with diabetic chronic kidney disease: Secondary | ICD-10-CM | POA: Diagnosis present

## 2021-01-17 DIAGNOSIS — M542 Cervicalgia: Secondary | ICD-10-CM | POA: Diagnosis present

## 2021-01-17 DIAGNOSIS — R935 Abnormal findings on diagnostic imaging of other abdominal regions, including retroperitoneum: Secondary | ICD-10-CM

## 2021-01-17 DIAGNOSIS — Z7951 Long term (current) use of inhaled steroids: Secondary | ICD-10-CM

## 2021-01-17 DIAGNOSIS — R531 Weakness: Secondary | ICD-10-CM

## 2021-01-17 DIAGNOSIS — M546 Pain in thoracic spine: Secondary | ICD-10-CM | POA: Diagnosis present

## 2021-01-17 DIAGNOSIS — J45909 Unspecified asthma, uncomplicated: Secondary | ICD-10-CM | POA: Diagnosis present

## 2021-01-17 DIAGNOSIS — Z881 Allergy status to other antibiotic agents status: Secondary | ICD-10-CM

## 2021-01-17 DIAGNOSIS — R112 Nausea with vomiting, unspecified: Secondary | ICD-10-CM | POA: Diagnosis present

## 2021-01-17 DIAGNOSIS — R197 Diarrhea, unspecified: Secondary | ICD-10-CM | POA: Diagnosis present

## 2021-01-17 DIAGNOSIS — Z8614 Personal history of Methicillin resistant Staphylococcus aureus infection: Secondary | ICD-10-CM

## 2021-01-17 DIAGNOSIS — I10 Essential (primary) hypertension: Secondary | ICD-10-CM | POA: Diagnosis present

## 2021-01-17 DIAGNOSIS — E785 Hyperlipidemia, unspecified: Secondary | ICD-10-CM | POA: Diagnosis present

## 2021-01-17 DIAGNOSIS — A419 Sepsis, unspecified organism: Secondary | ICD-10-CM | POA: Diagnosis present

## 2021-01-17 DIAGNOSIS — N2581 Secondary hyperparathyroidism of renal origin: Secondary | ICD-10-CM | POA: Diagnosis present

## 2021-01-17 DIAGNOSIS — E1121 Type 2 diabetes mellitus with diabetic nephropathy: Secondary | ICD-10-CM

## 2021-01-17 DIAGNOSIS — Z992 Dependence on renal dialysis: Secondary | ICD-10-CM

## 2021-01-17 DIAGNOSIS — Z20822 Contact with and (suspected) exposure to covid-19: Secondary | ICD-10-CM | POA: Diagnosis present

## 2021-01-17 DIAGNOSIS — R7401 Elevation of levels of liver transaminase levels: Secondary | ICD-10-CM | POA: Diagnosis present

## 2021-01-17 DIAGNOSIS — G43909 Migraine, unspecified, not intractable, without status migrainosus: Secondary | ICD-10-CM | POA: Diagnosis present

## 2021-01-17 DIAGNOSIS — R059 Cough, unspecified: Secondary | ICD-10-CM

## 2021-01-17 DIAGNOSIS — R945 Abnormal results of liver function studies: Secondary | ICD-10-CM | POA: Diagnosis present

## 2021-01-17 DIAGNOSIS — F419 Anxiety disorder, unspecified: Secondary | ICD-10-CM | POA: Diagnosis present

## 2021-01-17 DIAGNOSIS — Z8249 Family history of ischemic heart disease and other diseases of the circulatory system: Secondary | ICD-10-CM

## 2021-01-17 DIAGNOSIS — Z79899 Other long term (current) drug therapy: Secondary | ICD-10-CM

## 2021-01-17 DIAGNOSIS — Z888 Allergy status to other drugs, medicaments and biological substances status: Secondary | ICD-10-CM

## 2021-01-17 DIAGNOSIS — M4316 Spondylolisthesis, lumbar region: Secondary | ICD-10-CM | POA: Diagnosis present

## 2021-01-17 DIAGNOSIS — D696 Thrombocytopenia, unspecified: Secondary | ICD-10-CM | POA: Diagnosis present

## 2021-01-17 DIAGNOSIS — R000 Tachycardia, unspecified: Secondary | ICD-10-CM | POA: Diagnosis present

## 2021-01-17 LAB — CBC WITH DIFFERENTIAL/PLATELET
Abs Immature Granulocytes: 0.1 10*3/uL — ABNORMAL HIGH (ref 0.00–0.07)
Basophils Absolute: 0.1 10*3/uL (ref 0.0–0.1)
Basophils Relative: 1 %
Eosinophils Absolute: 0.1 10*3/uL (ref 0.0–0.5)
Eosinophils Relative: 1 %
HCT: 31.1 % — ABNORMAL LOW (ref 36.0–46.0)
Hemoglobin: 11 g/dL — ABNORMAL LOW (ref 12.0–15.0)
Immature Granulocytes: 1 %
Lymphocytes Relative: 12 %
Lymphs Abs: 1.1 10*3/uL (ref 0.7–4.0)
MCH: 29.3 pg (ref 26.0–34.0)
MCHC: 35.4 g/dL (ref 30.0–36.0)
MCV: 82.7 fL (ref 80.0–100.0)
Monocytes Absolute: 1 10*3/uL (ref 0.1–1.0)
Monocytes Relative: 10 %
Neutro Abs: 7.1 10*3/uL (ref 1.7–7.7)
Neutrophils Relative %: 75 %
Platelets: UNDETERMINED 10*3/uL (ref 150–400)
RBC: 3.76 MIL/uL — ABNORMAL LOW (ref 3.87–5.11)
RDW: 19.1 % — ABNORMAL HIGH (ref 11.5–15.5)
WBC: 9.4 10*3/uL (ref 4.0–10.5)
nRBC: 0 % (ref 0.0–0.2)

## 2021-01-17 LAB — LACTIC ACID, PLASMA
Lactic Acid, Venous: 2.3 mmol/L (ref 0.5–1.9)
Lactic Acid, Venous: 1.3 mmol/L (ref 0.5–1.9)

## 2021-01-17 LAB — COMPREHENSIVE METABOLIC PANEL
ALT: 67 U/L — ABNORMAL HIGH (ref 0–44)
AST: 41 U/L (ref 15–41)
Albumin: 3.3 g/dL — ABNORMAL LOW (ref 3.5–5.0)
Alkaline Phosphatase: 242 U/L — ABNORMAL HIGH (ref 38–126)
Anion gap: 18 — ABNORMAL HIGH (ref 5–15)
BUN: 33 mg/dL — ABNORMAL HIGH (ref 8–23)
CO2: 30 mmol/L (ref 22–32)
Calcium: 8.9 mg/dL (ref 8.9–10.3)
Chloride: 89 mmol/L — ABNORMAL LOW (ref 98–111)
Creatinine, Ser: 6.43 mg/dL — ABNORMAL HIGH (ref 0.44–1.00)
GFR, Estimated: 7 mL/min — ABNORMAL LOW (ref >60)
Glucose, Bld: 159 mg/dL — ABNORMAL HIGH (ref 70–99)
Potassium: 4.1 mmol/L (ref 3.5–5.1)
Sodium: 137 mmol/L (ref 135–145)
Total Bilirubin: 2.1 mg/dL — ABNORMAL HIGH (ref 0.3–1.2)
Total Protein: 7.1 g/dL (ref 6.5–8.1)

## 2021-01-17 LAB — RESP PANEL BY RT-PCR (FLU A&B, COVID) ARPGX2
Influenza A by PCR: NEGATIVE
Influenza B by PCR: NEGATIVE
SARS Coronavirus 2 by RT PCR: NEGATIVE

## 2021-01-17 LAB — PROTIME-INR
INR: 1 (ref 0.8–1.2)
Prothrombin Time: 13.5 seconds (ref 11.4–15.2)

## 2021-01-17 LAB — D-DIMER, QUANTITATIVE: D-Dimer, Quant: 4.66 ug/mL-FEU — ABNORMAL HIGH (ref 0.00–0.50)

## 2021-01-17 LAB — APTT: aPTT: 29 seconds (ref 24–36)

## 2021-01-17 LAB — CBG MONITORING, ED: Glucose-Capillary: 164 mg/dL — ABNORMAL HIGH (ref 70–99)

## 2021-01-17 MED ORDER — VANCOMYCIN HCL 750 MG/150ML IV SOLN
750.0000 mg | INTRAVENOUS | Status: DC
  Filled 2021-01-17 (×3): qty 150

## 2021-01-17 MED ORDER — VANCOMYCIN HCL 1500 MG/300ML IV SOLN
1500.0000 mg | Freq: Once | INTRAVENOUS | Status: AC
Start: 2021-01-17 — End: 2021-01-17
  Administered 2021-01-17: 1500 mg via INTRAVENOUS
  Filled 2021-01-17: qty 300

## 2021-01-17 MED ORDER — SODIUM CHLORIDE 0.9 % IV BOLUS
1000.0000 mL | Freq: Once | INTRAVENOUS | Status: DC
Start: 2021-01-17 — End: 2021-01-17

## 2021-01-17 MED ORDER — SODIUM CHLORIDE 0.9 % IV SOLN
1.0000 g | INTRAVENOUS | Status: DC
  Administered 2021-01-18: 1 g via INTRAVENOUS
  Filled 2021-01-17 (×3): qty 1

## 2021-01-17 MED ORDER — ACETAMINOPHEN 500 MG PO TABS
1000.0000 mg | ORAL_TABLET | Freq: Once | ORAL | Status: AC
  Administered 2021-01-17: 1000 mg via ORAL
  Filled 2021-01-17: qty 2

## 2021-01-17 MED ORDER — IOHEXOL 350 MG/ML SOLN
75.0000 mL | Freq: Once | INTRAVENOUS | Status: AC | PRN
  Administered 2021-01-17: 75 mL via INTRAVENOUS

## 2021-01-17 MED ORDER — SODIUM CHLORIDE 0.9 % IV SOLN
2.0000 g | Freq: Once | INTRAVENOUS | Status: AC
  Administered 2021-01-17: 2 g via INTRAVENOUS
  Filled 2021-01-17: qty 2

## 2021-01-17 NOTE — Progress Notes (Signed)
Pharmacy Antibiotic Note  Belinda [name] is a 70 y.o. female admitted on 01/17/2021 presenting with fatigue and confusion, concern for sepsis.  Pharmacy has been consulted for vancomycin and cefepime dosing.  ESRD-HD usually TTS, received HD today  Plan: Vancomycin 1500 mg IV x 1, then 750 mg IV q HD Cefepime 2g IV x 1, then 1g IV q 24h Monitor HD schedule, Cx and clinical progression to narrow Vancomycin random level as needed     Temp (24hrs), Avg:101.9 F (38.8 C), Min:101.9 F (38.8 C), Max:101.9 F (38.8 C)  Recent Labs  Lab 01/17/21 1805  WBC 9.4  CREATININE 6.43*  LATICACIDVEN 2.3*    CrCl cannot be calculated (Unknown ideal weight.).    Allergies  Allergen Reactions   Doxycycline Other (See Comments)   Entresto [Sacubitril-Valsartan] Itching   Metformin Other (See Comments)   Omnicef [Cefdinir] Other (See Comments)    GI Upset   Aspirin Other (See Comments)    GI issues, Can tolerate low aspirin   Augmentin [Amoxicillin-Pot Clavulanate] Nausea And Vomiting and Other (See Comments)    GI issues DID THE REACTION INVOLVE: Swelling of the face/tongue/throat, SOB, or low BP? No Sudden or severe rash/hives, skin peeling, or the inside of the mouth or nose? No Did it require medical treatment? No When did it last happen?      long time  If all above answers are "NO", may proceed with cephalosporin use.    Erythromycin Nausea And Vomiting and Other (See Comments)    Gi issues   Gabapentin Swelling   Nickel Itching and Rash    Breakouts    Bertis Ruddy, PharmD Clinical Pharmacist ED Pharmacist Phone # [phone] 01/17/2021 9:04 PM

## 2021-01-17 NOTE — ED Notes (Signed)
Critical Lactic Acid 2.3 reported to [facility] [employer]

## 2021-01-17 NOTE — ED Provider Notes (Signed)
Emergency Medicine Provider Triage Evaluation Note  Belinda [name] , a 70 y.o. female  was evaluated in triage.  Pt complains of fatigue, nausea, vomiting, diarrhea, cough, as well as confusion.  Patient states she is a Tuesday, Thursday, Saturday dialysis patient.  She skipped dialysis on Tuesday and Thursday due to "feeling dehydrated".  She was dialyzed earlier today.  I spoke to her husband over the phone who states that she has been experiencing the prior mentioned symptoms.  He also feels that she is becoming more confused.  Patient endorses upper back pain and cough but denies any other complaints to me.  States she is vaccinated for COVID-19 x3 and denies any known previous infections.  Physical Exam  BP (!) 154/75   Pulse (!) 121   Temp (!) 101.9 F (38.8 C)   Resp 20   SpO2 92%  Gen:   Awake Resp:  Normal effort  MSK:   Moves extremities without difficulty  Other:  Febrile with diffuse rigors.  Tenderness appreciated along the thoracic region diffusely.  No CVA tenderness.  Tachycardic.  Medical Decision Making  Medically screening exam initiated at 5:52 PM.  Appropriate orders placed.  Belinda [name] was informed that the remainder of the evaluation will be completed by another provider, this initial triage assessment does not replace that evaluation, and the importance of remaining in the ED until their evaluation is complete.   Belinda Sexton, PA-C 01/17/21 1754    Luna Fuse, MD 01/18/21 1110

## 2021-01-17 NOTE — Sepsis Progress Note (Signed)
Following for sepsis monitoring ?

## 2021-01-17 NOTE — ED Triage Notes (Signed)
Pt reports generalized weakness after dialysis today. T, Th, S dialysis patient-missed T and Th this week but completed full tx today. Febrile in triage. Endorses back pain.

## 2021-01-17 NOTE — ED Provider Notes (Signed)
[facility] EMERGENCY DEPARTMENT Provider Note   CSN: [id number] Arrival date & time: 01/17/21  1704     History Chief Complaint  Patient presents with   Weakness    Belinda [name] is a 70 y.o. female with prior medical history of acute CHF with recent echo of 60-65%, ESRD on Tuesday Thursday Saturday dialysis, did get dialysis  today.  Patient states that she started feeling weak a week ago, started having nausea vomiting and diarrhea throughout the week, has been getting better, was unable to go to her to dialysis sessions during the week because of her weakness.  States that she was able to go to her dialysis today, however her dialysis center here due to her weakness and her hypoxia.  Patient has never had hypoxia before, satting at 88% on room air.  Patient denies feeling short of breath, however states that she just feels weak.  Denies any chest pain.  Patient states that her nausea vomiting diarrhea and abdominal pain have subsided.  Denies any sick contacts.  Has been vaccinated against COVID.  Denies any fevers, however temperature today is 101.9..  Patient endorses back pain, however states that this is chronic back pain that she has had for multiple years and this is not new.  Denies any paresthesias down into her legs, IV drug use, saddle paresthesias.  Denies any cough or URI symptoms.  HPI     Past Medical History:  Diagnosis Date   Acute CHF (congestive heart failure) ([place]) 02/02/2018   Anemia    "get monthly injections to boost my HgB" (02/02/2018)   Anxiety    Asthma    Back pain    intractable low back   Chronic neck pain    CKD (chronic kidney disease), stage III (HCC)    on dialysis T/Th/Sa Jeneen Rinks   Depression    Dysrhythmia    palpitations or heart racing periodic. has had for years- Dr Mare Ferrari follows.   GERD (gastroesophageal reflux disease)    History of blood transfusion 2016   After knee replacemnet   Hx MRSA infection     Hyperlipidemia    Hypertension    Migraines    "~ 2/month" (02/02/2018), gets headaches with dialysis   Nerve damage    right hand   Osteoarthritis    "knees, neck" (02/02/2018)   Palpitations    Pneumonia    "twice in 2018" (02/02/2018)   Pulmonary artery hypertension (HCC)    Seasonal allergies    Type II diabetes mellitus (HCC)    insulin dependent - fasting 140-200     Patient Active Problem List   Diagnosis Date Noted   Renal failure (ARF), acute on chronic (HCC)    Hyperglycemia    Pressure injury of skin 02/04/2018   Acute CHF (congestive heart failure) ([place]) 02/02/2018   DM (diabetes mellitus), type 2 with renal complications ([place]) 95/28/4132   Bilateral lower extremity edema 44/06/270   Chronic systolic (congestive) heart failure ([place]) 53/66/4403   Acute systolic (congestive) heart failure ([place]) 05/30/2017   Essential hypertension 05/30/2017   Congestive heart failure ([place]) 04/26/2017   Chronic kidney disease 04/26/2017   DJD (degenerative joint disease) of knee 02/12/2015   Frequent PVCs 11/12/2014   Cervical spondylosis with myelopathy and radiculopathy 01/02/2014   Cervical spondylosis with myelopathy 01/01/2014   Wound check, abscess 06/14/2013   Cellulitis and abscess of buttock - right 06/12/2013   Dyspnea on exertion 03/22/2011   Chest pain 03/22/2011  Diabetes mellitus    Back pain    Palpitations    Anemia    Hx MRSA infection    Osteoarthritis     Past Surgical History:  Procedure Laterality Date   ABDOMINAL HYSTERECTOMY  03/2001   Abdominal supracervical hysterectomy, left salpingo-oophorectomy   ANTERIOR CERVICAL DECOMP/DISCECTOMY FUSION N/A 01/01/2014   Procedure: CERVICAL THREE-FOUR,CERVICAL FIVE-SIX,CERVICAL SIX-SEVEN ANTERIOR CERVICAL DECOMPRESSION/DISCECTOMY/FUSION;  Surgeon: Kristeen Miss, MD;  Location: [facility];  Service: Neurosurgery;  Laterality: N/A;  left-side approach   AV FISTULA PLACEMENT Right 02/13/2020   Procedure: ARTERIOVENOUS  (AV) FISTULA CREATION;  Surgeon: Waynetta Sandy, MD;  Location: [place];  Service: Vascular;  Laterality: Right;   AV FISTULA PLACEMENT Right 08/11/2020   Procedure: INSERTION OF RIGHT THIGH ARTERIOVENOUS (AV) GORE-TEX GRAFT;  Surgeon: Angelia Mould, MD;  Location: [place];  Service: Vascular;  Laterality: Right;   [place] Right 04/16/2020   Procedure: BASCILIC VEIN TRANSPOSITION SECOND STAGE RIGHT;  Surgeon: Waynetta Sandy, MD;  Location: [place];  Service: Vascular;  Laterality: Right;   CATARACT EXTRACTION W/ INTRAOCULAR LENS  IMPLANT, BILATERAL Bilateral 2012   CESAREAN SECTION     COLONOSCOPY W/ POLYPECTOMY     DILATION AND CURETTAGE OF UTERUS  X 2   ESOPHAGOGASTRODUODENOSCOPY     HAND SURGERY Left 1970s   laceration repair 4th and 5th digits   KNEE ARTHROSCOPY Left    torn mensicus   LIGATION OF COMPETING BRANCHES OF ARTERIOVENOUS FISTULA Right 05/07/2020   Procedure: LIGATION OF RIGHT UPPER EXTREMITY FISTULA;  Surgeon: Waynetta Sandy, MD;  Location: [place];  Service: Vascular;  Laterality: Right;   NASAL SINUS SURGERY     RIGHT HEART CATH N/A 06/23/2018   Procedure: RIGHT HEART CATH;  Surgeon: Jolaine Artist, MD;  Location: [place] [facility];  Service: Cardiovascular;  Laterality: N/A;   TOTAL KNEE ARTHROPLASTY Left 02/12/2015   Procedure: TOTAL KNEE ARTHROPLASTY;  Surgeon: [name], MD;  Location: [place];  Service: Orthopedics;  Laterality: Left;     OB History   No obstetric history on file.     Family History  Problem Relation Age of Onset   Heart disease Mother    Heart failure Father    Colon cancer Neg Hx    Stomach cancer Neg Hx    Esophageal cancer Neg Hx     Social History   Tobacco Use   Smoking status: Never   Smokeless tobacco: Never  Vaping Use   Vaping Use: Never used  Substance Use Topics   Alcohol use: Never   Drug use: Never    Home Medications Prior to  Admission medications   Medication Sig Start Date End Date Taking? Authorizing Provider  acetaminophen (TYLENOL) 650 MG CR tablet Take 650-1,300 mg by mouth every 8 (eight) hours as needed for pain (pain).     [provider]  Alpha-D-Galactosidase (BEANO PO) Take 1-2 tablets by mouth daily as needed (gas). With certain foods.    [provider]  aspirin EC 81 MG tablet Take 81 mg by mouth 3 (three) times a week. Swallow whole.    [provider]  Azelastine HCl 0.15 % SOLN Place 2 sprays into both nostrils daily.    [provider]  budesonide-formoterol (SYMBICORT) 160-4.5 MCG/ACT inhaler Inhale 2 puffs into the lungs 2 (two) times daily as needed (cough/respiratory issues.).     [provider]  diclofenac sodium (  VOLTAREN) 1 % GEL Apply 1 application topically 4 (four) times daily as needed (neck pain).     [provider]  ezetimibe-simvastatin (VYTORIN) 10-40 MG per tablet Take 1 tablet by mouth at bedtime.     [provider]  ferric citrate (AURYXIA) 1 GM 210 MG(Fe) tablet Take 210-630 mg by mouth See admin instructions. Take 3 tablets (630 mg) by mouth 3 times daily with meals & take 1 tablet (210 mg) by mouth with each snack 11/01/19   [provider]  fexofenadine (ALLEGRA) 180 MG tablet Take 180 mg by mouth daily.    [provider]  guaiFENesin (MUCINEX) 600 MG 12 hr tablet Take 600 mg by mouth in the morning and at bedtime.     [provider]  insulin lispro (HUMALOG) 100 UNIT/ML injection Inject 4-10 Units into the skin 3 (three) times daily before meals. Sliding Scale    [provider]  IRON SUCROSE IV Inject 1 Dose into the vein as needed.    [provider]  ketotifen (ZADITOR) 0.025 % ophthalmic solution Place 1 drop into both eyes 2 (two) times daily as needed (allergy/dry/irritated eyes.).    [provider]  LANTUS SOLOSTAR 100 UNIT/ML Solostar Pen Inject 18 Units  into the skin at bedtime.  04/19/18   [provider]  losartan (COZAAR) 25 MG tablet Take 12.5 mg by mouth daily. 07/11/20   [provider]  Magnesium 200 MG TABS Take 0.5 tablets (100 mg total) by mouth 2 (two) times daily.    [provider]  Methoxy PEG-Epoetin Beta (MIRCERA IJ) Mircera 02/14/20 02/12/21  [provider]  midodrine (PROAMATINE) 10 MG tablet Take 10 mg by mouth 3 (three) times a week. Pre dialysis    [provider]  multivitamin (RENA-VIT) TABS tablet Take 1 tablet by mouth daily with breakfast.  11/01/19   [provider]  nitroGLYCERIN (NITROSTAT) 0.4 MG SL tablet Place 1 tablet (0.4 mg total) under the tongue every 5 (five) minutes x 3 doses as needed for chest pain. 03/14/20   Bensimhon, Shaune Pascal, MD  simethicone (MYLICON) 80 MG chewable tablet Chew 160 mg by mouth daily as needed for flatulence (indigestion.).     [provider]  VITAMIN D, CHOLECALCIFEROL, PO Take by mouth 3 (three) times a week. With dialysis    [provider]    Allergies    Doxycycline, Entresto [sacubitril-valsartan], Metformin, Omnicef [cefdinir], Aspirin, Augmentin [amoxicillin-pot clavulanate], Erythromycin, Gabapentin, and Nickel  Review of Systems   Review of Systems  Constitutional:  Positive for fever. Negative for chills, diaphoresis and fatigue.  HENT:  Negative for congestion, sore throat and trouble swallowing.   Eyes:  Negative for pain and visual disturbance.  Respiratory:  Negative for cough, shortness of breath and wheezing.   Cardiovascular:  Negative for chest pain, palpitations and leg swelling.  Gastrointestinal:  Negative for abdominal distention, abdominal pain, diarrhea, nausea and vomiting.  Genitourinary:  Negative for difficulty urinating.  Musculoskeletal:  Negative for back pain, neck pain and neck stiffness.  Skin:  Negative for pallor.  Neurological:  Positive for weakness. Negative for dizziness,  speech difficulty and headaches.  Psychiatric/Behavioral:  Negative for confusion.    Physical Exam Updated Vital Signs BP (!) 143/70   Pulse 97   Temp (!) 101.9 F (38.8 C)   Resp 18   SpO2 98%   Physical Exam Constitutional:      General: She is not in acute distress.  Appearance: Normal appearance. She is not ill-appearing, toxic-appearing or diaphoretic.     Comments: Pt is tachycardic and hypoxic, however appears well.Does not appear toxic or in distress.   HENT:     Mouth/Throat:     Mouth: Mucous membranes are moist.     Pharynx: Oropharynx is clear.  Eyes:     General: No scleral icterus.    Extraocular Movements: Extraocular movements intact.     Pupils: Pupils are equal, round, and reactive to light.  Cardiovascular:     Rate and Rhythm: Regular rhythm. Tachycardia present.     Pulses: Normal pulses.     Heart sounds: Normal heart sounds.  Pulmonary:     Effort: Pulmonary effort is normal. No respiratory distress.     Breath sounds: Normal breath sounds. No stridor. No wheezing, rhonchi or rales.  Chest:     Chest wall: No tenderness.  Abdominal:     General: Abdomen is flat. There is no distension.     Palpations: Abdomen is soft.     Tenderness: There is no abdominal tenderness. There is no guarding or rebound.  Musculoskeletal:        General: No swelling or tenderness. Normal range of motion.     Cervical back: Normal range of motion and neck supple. No rigidity.     Right lower leg: No edema.     Left lower leg: No edema.     Comments: No midline tenderness.   Skin:    General: Skin is warm and dry.     Capillary Refill: Capillary refill takes less than 2 seconds.     Coloration: Skin is not pale.     Comments: Access site on R Leg, does not appear infected   Neurological:     General: No focal deficit present.     Mental Status: She is alert and oriented to person, place, and time.  Psychiatric:        Mood and Affect: Mood normal.         Behavior: Behavior normal.    ED Results / Procedures / Treatments   Labs (all labs ordered are listed, but only abnormal results are displayed) Labs Reviewed  LACTIC ACID, PLASMA - Abnormal; Notable for the following components:      Result Value   Lactic Acid, Venous 2.3 (*)    All other components within normal limits  COMPREHENSIVE METABOLIC PANEL - Abnormal; Notable for the following components:   Chloride 89 (*)    Glucose, Bld 159 (*)    BUN 33 (*)    Creatinine, Ser 6.43 (*)    Albumin 3.3 (*)    ALT 67 (*)    Alkaline Phosphatase 242 (*)    Total Bilirubin 2.1 (*)    GFR, Estimated 7 (*)    Anion gap 18 (*)    All other components within normal limits  CBC WITH DIFFERENTIAL/PLATELET - Abnormal; Notable for the following components:   RBC 3.76 (*)    Hemoglobin 11.0 (*)    HCT 31.1 (*)    RDW 19.1 (*)    Abs Immature Granulocytes 0.10 (*)    All other components within normal limits  D-DIMER, QUANTITATIVE - Abnormal; Notable for the following components:   D-Dimer, Quant 4.66 (*)    All other components within normal limits  CBG MONITORING, ED - Abnormal; Notable for the following components:   Glucose-Capillary 164 (*)    All other components within normal limits  RESP PANEL  BY RT-PCR (FLU A&B, COVID) [id number]  CULTURE, BLOOD (ROUTINE X 2)  URINE CULTURE  CULTURE, BLOOD (ROUTINE X 2)  PROTIME-INR  APTT  LACTIC ACID, PLASMA  LACTIC ACID, PLASMA  URINALYSIS, ROUTINE W REFLEX MICROSCOPIC  LACTIC ACID, PLASMA    EKG EKG Interpretation  Date/Time:  Saturday January 17 2021 18:01:18 EDT Ventricular Rate:  123 PR Interval:  160 QRS Duration: 88 QT Interval:  314 QTC Calculation: 449 R Axis:   -61 Text Interpretation: Sinus tachycardia Left axis deviation Inferior infarct , age undetermined Anteroseptal infarct , age undetermined Abnormal ECG Confirmed by Quintella Reichert [phone]) on 01/17/2021 7:27:37 PM  Radiology DG Chest 1 View  Result Date:  01/17/2021 CLINICAL DATA:  Weakness. EXAM: CHEST  1 VIEW COMPARISON:  March 19, 2019. FINDINGS: Stable cardiomediastinal silhouette. No pneumothorax is noted. Hypoinflation of the lungs is noted with minimal bibasilar subsegmental atelectasis. Bony thorax is unremarkable. IMPRESSION: Hypoinflation of the lungs is noted with minimal bibasilar subsegmental atelectasis. Electronically Signed   By: Marijo Conception M.D.   On: 01/17/2021 18:29    Procedures .Critical Care  Date/Time: 01/17/2021 11:42 PM Performed by: Alfredia Client, PA-C Authorized by: Alfredia Client, PA-C   Critical care provider statement:    Critical care time (minutes):  45   Critical care was time spent personally by me on the following activities:  Discussions with consultants, evaluation of patient's response to treatment, examination of patient, ordering and performing treatments and interventions, ordering and review of laboratory studies, ordering and review of radiographic studies, pulse oximetry, re-evaluation of patient's condition, obtaining history from patient or surrogate and review of old charts   Medications Ordered in ED Medications  ceFEPIme (MAXIPIME) 1 g in sodium chloride 0.9 % 100 mL IVPB (has no administration in time range)  vancomycin (VANCOREADY) IVPB 750 mg/150 mL (has no administration in time range)  acetaminophen (TYLENOL) tablet 1,000 mg (1,000 mg Oral Given 01/17/21 1758)  ceFEPIme (MAXIPIME) 2 g in sodium chloride 0.9 % 100 mL IVPB (0 g Intravenous Stopped 01/17/21 2055)  vancomycin (VANCOREADY) IVPB 1500 mg/300 mL (0 mg Intravenous Stopped 01/17/21 2252)    ED Course  I have reviewed the triage vital signs and the nursing notes.  Pertinent labs & imaging results that were available during my care of the patient were reviewed by me and considered in my medical decision making (see chart for details).    MDM Rules/Calculators/A&P                          Belinda [name] is a 70 y.o. female  with prior medical history of acute CHF with recent echo of 65 to 70%, ESRD on Tuesday Thursday Saturday dialysis, did get dialysis today.  Patient presenting hypoxic, on 2 L, febrile and tachycardic, will obtain sepsis work-up at this time.  We will hold off on fluids since patient is dialysis patient and lactic acid is 2.3, patient is not hypotensive.  Did speak to Dr. Ralene Bathe about this.  Differential to include sepsis, unknown source at this time, patient does make urine, awaiting for urine.  CMP with electrolyte derangements due to dialysis, potassium is normal.  CBC without any white count.  D-dimer elevated to 4.6, will obtain CT PE imaging at this time.  Patient is dialysis patient and has been for a year, contrast needed at this time due to elevated D-dimer with new hypoxia.  Patient will get dialysis in 2 days.  Also obtain CT abdomen pelvis without contrast to rule out SBO since patient has had nausea vomiting and some diarrhea over the past week. No abdominal pain.  Patient is mentating fine, no altered mental status.  Upon evaluation, patient appears unchanged, awaiting CT imaging. Lactic acid improving. COVID negative.   Pt care was handed off to K. Humes PA-C at 47.  Complete history and physical and current plan have been communicated.  Please refer to their note for the remainder of ED care and ultimate disposition.  Plan is for admission, awaiting CT scans at this time to rule out PE. I discussed this case with my attending physician who cosigned this note including patient's presenting symptoms, physical exam, and planned diagnostics and interventions. Attending physician stated agreement with plan or made changes to plan which were implemented.   Attending physician assessed patient at bedside.   Final Clinical Impression(s) / ED Diagnoses Final diagnoses:  Weakness    Rx / DC Orders ED Discharge Orders     None        Alfredia Client, PA-C 01/17/21 2348    Quintella Reichert, MD 01/19/21 2259

## 2021-01-18 ENCOUNTER — Observation Stay (HOSPITAL_COMMUNITY): Payer: Medicare Other

## 2021-01-18 DIAGNOSIS — A419 Sepsis, unspecified organism: Secondary | ICD-10-CM | POA: Diagnosis not present

## 2021-01-18 DIAGNOSIS — N186 End stage renal disease: Secondary | ICD-10-CM

## 2021-01-18 DIAGNOSIS — R945 Abnormal results of liver function studies: Secondary | ICD-10-CM | POA: Diagnosis present

## 2021-01-18 DIAGNOSIS — D631 Anemia in chronic kidney disease: Secondary | ICD-10-CM | POA: Diagnosis present

## 2021-01-18 DIAGNOSIS — R7989 Other specified abnormal findings of blood chemistry: Secondary | ICD-10-CM | POA: Diagnosis present

## 2021-01-18 DIAGNOSIS — R0902 Hypoxemia: Secondary | ICD-10-CM | POA: Diagnosis present

## 2021-01-18 LAB — BLOOD CULTURE ID PANEL (REFLEXED) - BCID2

## 2021-01-18 LAB — CBC WITH DIFFERENTIAL/PLATELET
Abs Immature Granulocytes: 0.13 10*3/uL — ABNORMAL HIGH (ref 0.00–0.07)
Basophils Absolute: 0 10*3/uL (ref 0.0–0.1)
Basophils Relative: 0 %
Eosinophils Absolute: 0.1 10*3/uL (ref 0.0–0.5)
Eosinophils Relative: 2 %
HCT: 29.2 % — ABNORMAL LOW (ref 36.0–46.0)
Hemoglobin: 10 g/dL — ABNORMAL LOW (ref 12.0–15.0)
Immature Granulocytes: 2 %
Lymphocytes Relative: 16 %
Lymphs Abs: 1.2 10*3/uL (ref 0.7–4.0)
MCH: 29 pg (ref 26.0–34.0)
MCHC: 34.2 g/dL (ref 30.0–36.0)
MCV: 84.6 fL (ref 80.0–100.0)
Monocytes Absolute: 1.3 10*3/uL — ABNORMAL HIGH (ref 0.1–1.0)
Monocytes Relative: 16 %
Neutro Abs: 5.2 10*3/uL (ref 1.7–7.7)
Neutrophils Relative %: 64 %
Platelets: 79 10*3/uL — ABNORMAL LOW (ref 150–400)
RBC: 3.45 MIL/uL — ABNORMAL LOW (ref 3.87–5.11)
RDW: 19.6 % — ABNORMAL HIGH (ref 11.5–15.5)
WBC: 8 10*3/uL (ref 4.0–10.5)
nRBC: 0 % (ref 0.0–0.2)

## 2021-01-18 LAB — BASIC METABOLIC PANEL
Anion gap: 16 — ABNORMAL HIGH (ref 5–15)
BUN: 38 mg/dL — ABNORMAL HIGH (ref 8–23)
CO2: 28 mmol/L (ref 22–32)
Calcium: 8.7 mg/dL — ABNORMAL LOW (ref 8.9–10.3)
Chloride: 89 mmol/L — ABNORMAL LOW (ref 98–111)
Creatinine, Ser: 7.41 mg/dL — ABNORMAL HIGH (ref 0.44–1.00)
GFR, Estimated: 6 mL/min — ABNORMAL LOW (ref >60)
Glucose, Bld: 100 mg/dL — ABNORMAL HIGH (ref 70–99)
Potassium: 3.9 mmol/L (ref 3.5–5.1)
Sodium: 133 mmol/L — ABNORMAL LOW (ref 135–145)

## 2021-01-18 LAB — CBG MONITORING, ED
Glucose-Capillary: 91 mg/dL (ref 70–99)
Glucose-Capillary: 234 mg/dL — ABNORMAL HIGH (ref 70–99)

## 2021-01-18 LAB — GLUCOSE, CAPILLARY
Glucose-Capillary: 293 mg/dL — ABNORMAL HIGH (ref 70–99)
Glucose-Capillary: 347 mg/dL — ABNORMAL HIGH (ref 70–99)
Glucose-Capillary: 351 mg/dL — ABNORMAL HIGH (ref 70–99)

## 2021-01-18 LAB — HEPATIC FUNCTION PANEL
ALT: 46 U/L — ABNORMAL HIGH (ref 0–44)
AST: 31 U/L (ref 15–41)
Albumin: 2.6 g/dL — ABNORMAL LOW (ref 3.5–5.0)
Alkaline Phosphatase: 196 U/L — ABNORMAL HIGH (ref 38–126)
Bilirubin, Direct: 0.3 mg/dL — ABNORMAL HIGH (ref 0.0–0.2)
Indirect Bilirubin: 1.5 mg/dL — ABNORMAL HIGH (ref 0.3–0.9)
Total Bilirubin: 1.8 mg/dL — ABNORMAL HIGH (ref 0.3–1.2)
Total Protein: 5.6 g/dL — ABNORMAL LOW (ref 6.5–8.1)

## 2021-01-18 LAB — PROCALCITONIN: Procalcitonin: 43.88 ng/mL

## 2021-01-18 MED ORDER — LOSARTAN POTASSIUM 25 MG PO TABS
12.5000 mg | ORAL_TABLET | Freq: Every day | ORAL | Status: DC
  Administered 2021-01-18 – 2021-01-25 (×7): 12.5 mg via ORAL
  Filled 2021-01-18 (×8): qty 0.5

## 2021-01-18 MED ORDER — EZETIMIBE 10 MG PO TABS
10.0000 mg | ORAL_TABLET | Freq: Every day | ORAL | Status: DC
  Administered 2021-01-18 – 2021-01-24 (×7): 10 mg via ORAL
  Filled 2021-01-18 (×8): qty 1

## 2021-01-18 MED ORDER — METRONIDAZOLE 500 MG/100ML IV SOLN
500.0000 mg | Freq: Three times a day (TID) | INTRAVENOUS | Status: DC
  Administered 2021-01-18 – 2021-01-19 (×4): 500 mg via INTRAVENOUS
  Filled 2021-01-18 (×5): qty 100

## 2021-01-18 MED ORDER — ONDANSETRON HCL 4 MG PO TABS: 4.0000 mg | ORAL_TABLET | Freq: Four times a day (QID) | ORAL | Status: DC | PRN

## 2021-01-18 MED ORDER — MIDODRINE HCL 5 MG PO TABS
10.0000 mg | ORAL_TABLET | ORAL | Status: DC
  Administered 2021-01-19: 10 mg via ORAL
  Filled 2021-01-18: qty 2

## 2021-01-18 MED ORDER — ASPIRIN EC 81 MG PO TBEC
81.0000 mg | DELAYED_RELEASE_TABLET | ORAL | Status: DC
  Administered 2021-01-19 – 2021-01-23 (×3): 81 mg via ORAL
  Filled 2021-01-18 (×3): qty 1

## 2021-01-18 MED ORDER — EZETIMIBE-SIMVASTATIN 10-40 MG PO TABS
1.0000 | ORAL_TABLET | Freq: Every day | ORAL | Status: DC
  Filled 2021-01-18 (×2): qty 1

## 2021-01-18 MED ORDER — SIMVASTATIN 20 MG PO TABS
40.0000 mg | ORAL_TABLET | Freq: Every day | ORAL | Status: DC
  Administered 2021-01-18 – 2021-01-24 (×7): 40 mg via ORAL
  Filled 2021-01-18 (×7): qty 2

## 2021-01-18 MED ORDER — ALPRAZOLAM 0.25 MG PO TABS
0.2500 mg | ORAL_TABLET | Freq: Two times a day (BID) | ORAL | Status: DC | PRN
  Administered 2021-01-18: 0.25 mg via ORAL
  Administered 2021-01-19: 0.5 mg via ORAL
  Administered 2021-01-20: 0.25 mg via ORAL
  Administered 2021-01-20 – 2021-01-24 (×5): 0.5 mg via ORAL
  Filled 2021-01-18 (×2): qty 2
  Filled 2021-01-18: qty 1
  Filled 2021-01-18 (×5): qty 2

## 2021-01-18 MED ORDER — ONDANSETRON HCL 4 MG/2ML IJ SOLN: 4.0000 mg | Freq: Four times a day (QID) | INTRAMUSCULAR | Status: DC | PRN

## 2021-01-18 MED ORDER — NITROGLYCERIN 0.4 MG SL SUBL
SUBLINGUAL_TABLET | SUBLINGUAL | Status: AC
  Administered 2021-01-18: 0.4 mg
  Filled 2021-01-18: qty 3

## 2021-01-18 MED ORDER — MOMETASONE FURO-FORMOTEROL FUM 200-5 MCG/ACT IN AERO
2.0000 | INHALATION_SPRAY | Freq: Two times a day (BID) | RESPIRATORY_TRACT | Status: DC
  Administered 2021-01-19 – 2021-01-25 (×13): 2 via RESPIRATORY_TRACT
  Filled 2021-01-18 (×3): qty 8.8

## 2021-01-18 MED ORDER — GUAIFENESIN ER 600 MG PO TB12
600.0000 mg | ORAL_TABLET | Freq: Two times a day (BID) | ORAL | Status: DC | PRN
  Administered 2021-01-18 – 2021-01-25 (×8): 600 mg via ORAL
  Filled 2021-01-18 (×8): qty 1

## 2021-01-18 MED ORDER — ACETAMINOPHEN 650 MG RE SUPP: 650.0000 mg | Freq: Four times a day (QID) | RECTAL | Status: DC | PRN

## 2021-01-18 MED ORDER — ENOXAPARIN SODIUM 30 MG/0.3ML IJ SOSY: 30.0000 mg | PREFILLED_SYRINGE | INTRAMUSCULAR | Status: DC

## 2021-01-18 MED ORDER — INSULIN ASPART 100 UNIT/ML IJ SOLN
0.0000 [IU] | Freq: Three times a day (TID) | INTRAMUSCULAR | Status: DC
  Administered 2021-01-18: 3 [IU] via SUBCUTANEOUS
  Administered 2021-01-18: 5 [IU] via SUBCUTANEOUS
  Administered 2021-01-19: 2 [IU] via SUBCUTANEOUS
  Administered 2021-01-19: 3 [IU] via SUBCUTANEOUS
  Administered 2021-01-19 – 2021-01-21 (×3): 5 [IU] via SUBCUTANEOUS
  Administered 2021-01-21: 7 [IU] via SUBCUTANEOUS
  Administered 2021-01-21 – 2021-01-22 (×2): 5 [IU] via SUBCUTANEOUS
  Administered 2021-01-22: 2 [IU] via SUBCUTANEOUS
  Administered 2021-01-23: 5 [IU] via SUBCUTANEOUS
  Administered 2021-01-23: 3 [IU] via SUBCUTANEOUS
  Administered 2021-01-23: 9 [IU] via SUBCUTANEOUS
  Administered 2021-01-24: 5 [IU] via SUBCUTANEOUS
  Administered 2021-01-24: 2 [IU] via SUBCUTANEOUS
  Administered 2021-01-25: 3 [IU] via SUBCUTANEOUS
  Administered 2021-01-25: 7 [IU] via SUBCUTANEOUS

## 2021-01-18 MED ORDER — HEPARIN SODIUM (PORCINE) 5000 UNIT/ML IJ SOLN
5000.0000 [IU] | Freq: Three times a day (TID) | INTRAMUSCULAR | Status: DC
  Administered 2021-01-18 – 2021-01-25 (×22): 5000 [IU] via SUBCUTANEOUS
  Filled 2021-01-18 (×22): qty 1

## 2021-01-18 MED ORDER — ACETAMINOPHEN 325 MG PO TABS
650.0000 mg | ORAL_TABLET | Freq: Four times a day (QID) | ORAL | Status: DC | PRN
  Administered 2021-01-18 – 2021-01-22 (×11): 650 mg via ORAL
  Filled 2021-01-18 (×11): qty 2

## 2021-01-18 NOTE — ED Notes (Signed)
CBG 91 

## 2021-01-18 NOTE — ED Notes (Signed)
Attempted report 

## 2021-01-18 NOTE — Progress Notes (Signed)
  PROGRESS NOTE  Patient admitted earlier this morning. See H&P.   Belinda [name] is a 70 y.o. female with medical history significant of chronic systolic heart failure, ESRD anemia, ESRD on hemodialysis, anxiety/depression, chronic cervical and lumbar back pain, GERD, history of unspecified MRSA infection, hyperlipidemia, hypertension, migraine headaches, right hand nerve damage, palpitations, history of pneumonia twice in 2018 and 2019, pulmonary artery hypertension, seasonal allergies, insulin-dependent type II DM who is coming to the emergency department with hypoxia in the high 80s and fever after having dialysis on Saturday.   She states that she has been ill for about a week, had a sandwich from [place] [employer] [employer] on Sunday then subsequently started to have multiple episodes of vomiting.  Then on Wednesday, finished her leftover sandwich, then continued to have multiple episodes of diarrhea through the day.  Due to feeling ill, she missed her dialysis on Tuesday and Thursday.  She was able to complete her session on Saturday, however, she continued to have worsening generalized weakness, had pulse ox with oxygen saturation in the high 80s, requiring nasal cannula O2.  On examination this morning in the emergency department, patient had finished eating her breakfast without any further GI symptoms.  Right upper quadrant ultrasound was unremarkable.  Patient makes little urine, denied any dysuria.  Blood culture is showing gram-positive cocci in 2 of 4 bottles, which could be contaminant.  Continue broad-spectrum antibiotics due to sepsis pathology.  Her procalcitonin remains elevated.  She remains on 1.5 L nasal cannula O2, does not appear to be in overt fluid overloaded state, patient will need dialysis as scheduled on Tuesday (if inpatient, she will need formal nephrology consultation).  Right femoral fistula site was last accessed on Saturday and used for complete HD treatment.  There is no signs  of any erythema, or tenderness to palpation to indicate local infection.   8 mm groundglass opacity in the left lung apex need to be followed as outpatient    Status is: Observation  The patient will require care spanning > 2 midnights and should be moved to inpatient because: IV treatments appropriate due to intensity of illness or inability to take PO  Dispo: The patient is from: Home              Anticipated d/c is to: Home              Patient currently is not medically stable to d/c.   Difficult to place patient No       Dessa Phi, DO [facility] 01/18/2021, 1:49 PM  Available via Epic secure chat 7am-7pm After these hours, please refer to coverage provider listed on [email or web address]

## 2021-01-18 NOTE — Progress Notes (Signed)
PHARMACY - PHYSICIAN COMMUNICATION CRITICAL VALUE ALERT - BLOOD CULTURE IDENTIFICATION (BCID)  Belinda [name] is an 70 y.o. female who presented to [facility] on 01/17/2021 presenting with confusion and fatigue with concern for sepsis, started on vancomycin and cefepime for broad coverage, pt ESRD-HD TTS and got full HD yesterday 7/23 before admission.    Assessment:  Growing 2/4 GPC, nothing on BCID, possibly strep species per lab, this could possibly be contaminant however no clear source ID for sepsis at this time  Name of physician (or Provider) Contacted: Maylene Roes  Current antibiotics: Vanc/cefepime  Changes to prescribed antibiotics recommended:  Continue current coverage for now with unknown source and will follow BCx growth and clinical status to narrow   Results for orders placed or performed during the hospital encounter of 01/17/21  Blood Culture ID Panel (Reflexed) (Collected: 01/17/2021  8:46 PM)  Result Value Ref Range   Enterococcus faecalis NOT DETECTED NOT DETECTED   Enterococcus Faecium NOT DETECTED NOT DETECTED   Listeria monocytogenes NOT DETECTED NOT DETECTED   Staphylococcus species NOT DETECTED NOT DETECTED   Staphylococcus aureus (BCID) NOT DETECTED NOT DETECTED   Staphylococcus epidermidis NOT DETECTED NOT DETECTED   Staphylococcus lugdunensis NOT DETECTED NOT DETECTED   Streptococcus species NOT DETECTED NOT DETECTED   Streptococcus agalactiae NOT DETECTED NOT DETECTED   Streptococcus pneumoniae NOT DETECTED NOT DETECTED   Streptococcus pyogenes NOT DETECTED NOT DETECTED   A.calcoaceticus-baumannii NOT DETECTED NOT DETECTED   Bacteroides fragilis NOT DETECTED NOT DETECTED   Enterobacterales NOT DETECTED NOT DETECTED   Enterobacter cloacae complex NOT DETECTED NOT DETECTED   Escherichia coli NOT DETECTED NOT DETECTED   Klebsiella aerogenes NOT DETECTED NOT DETECTED   Klebsiella oxytoca NOT DETECTED NOT DETECTED   Klebsiella pneumoniae NOT DETECTED NOT DETECTED    Proteus species NOT DETECTED NOT DETECTED   Salmonella species NOT DETECTED NOT DETECTED   Serratia marcescens NOT DETECTED NOT DETECTED   Haemophilus influenzae NOT DETECTED NOT DETECTED   Neisseria meningitidis NOT DETECTED NOT DETECTED   Pseudomonas aeruginosa NOT DETECTED NOT DETECTED   Stenotrophomonas maltophilia NOT DETECTED NOT DETECTED   Candida albicans NOT DETECTED NOT DETECTED   Candida auris NOT DETECTED NOT DETECTED   Candida glabrata NOT DETECTED NOT DETECTED   Candida krusei NOT DETECTED NOT DETECTED   Candida parapsilosis NOT DETECTED NOT DETECTED   Candida tropicalis NOT DETECTED NOT DETECTED   Cryptococcus neoformans/gattii NOT DETECTED NOT DETECTED    Bertis Ruddy 01/18/2021  11:34 AM

## 2021-01-18 NOTE — ED Notes (Signed)
CBG 91, pt currently eating breakfast

## 2021-01-18 NOTE — Significant Event (Signed)
Rapid Response Event Note   Reason for Call :  10/10 chest pain radiating to back. RN to give 0.4mg  ntg SL q75min x 3.   Initial Focused Assessment:  Pt lying in bed in no distress, alert and oriented. She is c/o 8/10 pain in her back. Per pt, this worsens with inspiration. Pt says this pain was present on day shift as well and has been getting progressively worse t/o day. Lungs clear, diminished in bases. Skin warm and dry.   T-98.3, HR-93, BP-117/68, RR-20, SpO2-97% on RA.    Interventions:  EKG-NSR, L axis deviation NTG 0.4mg  SL q34min x 3 D-dimer, Trop Plan of Care:  Pain down to 8/10 after 3 SL NTGs. Await lab results. Continue to monitor pt. Call RRT if further assistance neede.d   Event Summary:   MD Notified: Dr. Marlowe Sax Call Time:2305 Arrival Time:2320 End OEUM:3536  Dillard Essex, RN

## 2021-01-18 NOTE — Plan of Care (Signed)
  Problem: Health Behavior/Discharge Planning: Goal: Ability to manage health-related needs will improve Outcome: Progressing   Problem: Clinical Measurements: Goal: Will remain free from infection Outcome: Progressing   Problem: Activity: Goal: Risk for activity intolerance will decrease Outcome: Progressing   Problem: Nutrition: Goal: Adequate nutrition will be maintained Outcome: Progressing   

## 2021-01-18 NOTE — H&P (Signed)
History and Physical    Belinda [name] UDJ:[id number] DOB: [birth year] DOA: 01/17/2021  PCP: Reynold Bowen, MD  Patient coming from: Referred by HD center.  I have personally briefly reviewed patient's old medical records in [place]  Chief Complaint: Hypoxia and fever.  HPI: Belinda [name] is a 70 y.o. female with medical history significant of chronic systolic heart failure, ESRD anemia, ESRD on hemodialysis, anxiety/depression, chronic cervical and lumbar back pain, GERD, history of unspecified MRSA infection, hyperlipidemia, hypertension, migraine headaches, right hand nerve damage, palpitations, history of pneumonia twice in 2018 and 2019, pulmonary artery hypertension, seasonal allergies, insulin-dependent type II DM who is coming to the emergency department with hypoxia in the high 80s and fever after having dialysis today.  She mentioned that she has been sick for about a week.  She had nausea and multiple episodes of emesis on Sunday, but denied having abdominal pain, melena, hematochezia constipation or diarrhea that day.  However, she mentioned that on Wednesday she had multiple episodes of diarrhea throughout the day.  Her appetite has been decreased.  She has missed Tuesday and Thursday's dialysis, but was able to complete the session today.  However, after finishing HD she has not felt well and her generalized weakness became worse.  She was febrile in triage, but denies subjective fever, chills, night sweats, rhinorrhea, sore throat or wheezing.  She has occasional cough due to postnasal drip, but denied productive cough over the past week.  No chest pain, palpitations, diaphoresis, PND, orthopnea, recent lower extremity edema but has been lightheaded at times.  She urinates on frequently and denied dysuria, frequency or hematuria.  ED Course: Initial vital signs were temperature 101.9 F, pulse 121, respirations 20, BP 154/75 mmHg O2 sats 92% on room air.  The patient  received acetaminophen 1000 mg p.o. x1, cefepime 2 g IVP x1 and vancomycin 1500 mg IVP x1.  She also received IV contrast for CTA study.  Lab work: CBC showed a white count of 9.4, hemoglobin 11.0 g/dL and platelets clumps were seen.  Her D-dimer was 4.66 mcg/mL.  Coronavirus and influenza PCR was negative.  Normal PT, INR and PTT.  Lactic acid was 2.3 and then 1.3 mmol/L.  CMP showed a chloride of 89 mmol/L, all other electrolytes were within normal range.  Glucose 159, BUN 33, creatinine 6.43 and bilirubin 2.1 mg/dL.  Her ALT was 67 and alkaline phosphatase 242 units/L.  AST and total protein were normal.  Albumin was 3.3 g/dL.  Imaging: A portable 1 view chest radiograph showed hypoinflation of the lungs with minimal bibasilar subsegmental atelectasis.  CTA chest, abdomen and pelvis showed no evidence of pulmonary embolism.  There was cardiomegaly and a small pericardial effusion.  She had diffuse body wall edema that could reflect anasarca.  Gallbladder was distended with questionable stone or sludge..  She has a small umbilical hernia.  Please see images and full radiology report for further details.  Review of Systems: As per HPI otherwise all other systems reviewed and are negative.  Past Medical History:  Diagnosis Date   Acute CHF (congestive heart failure) ([place]) 02/02/2018   Anemia    "get monthly injections to boost my HgB" (02/02/2018)   Anxiety    Asthma    Back pain    intractable low back   Chronic neck pain    CKD (chronic kidney disease), stage III ([place])    on dialysis T/Th/Sa Jeneen Rinks   Depression    Dysrhythmia  palpitations or heart racing periodic. has had for years- Dr Mare Ferrari follows.   GERD (gastroesophageal reflux disease)    History of blood transfusion 2016   After knee replacemnet   Hx MRSA infection    Hyperlipidemia    Hypertension    Migraines    "~ 2/month" (02/02/2018), gets headaches with dialysis   Nerve damage    right hand   Osteoarthritis     "knees, neck" (02/02/2018)   Palpitations    Pneumonia    "twice in 2018" (02/02/2018)   Pulmonary artery hypertension (HCC)    Seasonal allergies    Type II diabetes mellitus (HCC)    insulin dependent - fasting 140-200    Past Surgical History:  Procedure Laterality Date   ABDOMINAL HYSTERECTOMY  03/2001   Abdominal supracervical hysterectomy, left salpingo-oophorectomy   ANTERIOR CERVICAL DECOMP/DISCECTOMY FUSION N/A 01/01/2014   Procedure: CERVICAL THREE-FOUR,CERVICAL FIVE-SIX,CERVICAL SIX-SEVEN ANTERIOR CERVICAL DECOMPRESSION/DISCECTOMY/FUSION;  Surgeon: Kristeen Miss, MD;  Location: [facility];  Service: Neurosurgery;  Laterality: N/A;  left-side approach   AV FISTULA PLACEMENT Right 02/13/2020   Procedure: ARTERIOVENOUS (AV) FISTULA CREATION;  Surgeon: Waynetta Sandy, MD;  Location: [facility];  Service: Vascular;  Laterality: Right;   AV FISTULA PLACEMENT Right 08/11/2020   Procedure: INSERTION OF RIGHT THIGH ARTERIOVENOUS (AV) GORE-TEX GRAFT;  Surgeon: Angelia Mould, MD;  Location: [facility];  Service: Vascular;  Laterality: Right;   [place] Right 04/16/2020   Procedure: BASCILIC VEIN TRANSPOSITION SECOND STAGE RIGHT;  Surgeon: Waynetta Sandy, MD;  Location: [facility];  Service: Vascular;  Laterality: Right;   CATARACT EXTRACTION W/ INTRAOCULAR LENS  IMPLANT, BILATERAL Bilateral 2012   CESAREAN SECTION     COLONOSCOPY W/ POLYPECTOMY     DILATION AND CURETTAGE OF UTERUS  X 2   ESOPHAGOGASTRODUODENOSCOPY     HAND SURGERY Left 1970s   laceration repair 4th and 5th digits   KNEE ARTHROSCOPY Left    torn mensicus   LIGATION OF COMPETING BRANCHES OF ARTERIOVENOUS FISTULA Right 05/07/2020   Procedure: LIGATION OF RIGHT UPPER EXTREMITY FISTULA;  Surgeon: Waynetta Sandy, MD;  Location: [facility];  Service: Vascular;  Laterality: Right;   NASAL SINUS SURGERY     RIGHT HEART CATH N/A 06/23/2018   Procedure: RIGHT HEART CATH;   Surgeon: Jolaine Artist, MD;  Location: [facility];  Service: Cardiovascular;  Laterality: N/A;   TOTAL KNEE ARTHROPLASTY Left 02/12/2015   Procedure: TOTAL KNEE ARTHROPLASTY;  Surgeon: [name], MD;  Location: [facility];  Service: Orthopedics;  Laterality: Left;   Social History  reports that she has never smoked. She has never used smokeless tobacco. She reports that she does not drink alcohol and does not use drugs.  Allergies  Allergen Reactions   Doxycycline Other (See Comments)   Entresto [Sacubitril-Valsartan] Itching   Metformin Other (See Comments)   Omnicef [Cefdinir] Other (See Comments)    GI Upset   Aspirin Other (See Comments)    GI issues, Can tolerate low aspirin   Augmentin [Amoxicillin-Pot Clavulanate] Nausea And Vomiting and Other (See Comments)    GI issues DID THE REACTION INVOLVE: Swelling of the face/tongue/throat, SOB, or low BP? No Sudden or severe rash/hives, skin peeling, or the inside of the mouth or nose? No Did it require medical treatment? No When did it last happen?      long time  If all above answers are "NO", may proceed with  cephalosporin use.    Erythromycin Nausea And Vomiting and Other (See Comments)    Gi issues   Gabapentin Swelling   Nickel Itching and Rash    Breakouts    Family History  Problem Relation Age of Onset   Heart disease Mother    Heart failure Father    Colon cancer Neg Hx    Stomach cancer Neg Hx    Esophageal cancer Neg Hx    Prior to Admission medications   Medication Sig Start Date End Date Taking? Authorizing Provider  acetaminophen (TYLENOL) 650 MG CR tablet Take 650-1,300 mg by mouth every 8 (eight) hours as needed for pain (pain).     [provider]  Alpha-D-Galactosidase (BEANO PO) Take 1-2 tablets by mouth daily as needed (gas). With certain foods.    [provider]  aspirin EC 81 MG tablet Take 81 mg by mouth 3 (three) times a week. Swallow whole.    [provider]   Azelastine HCl 0.15 % SOLN Place 2 sprays into both nostrils daily.    [provider]  budesonide-formoterol (SYMBICORT) 160-4.5 MCG/ACT inhaler Inhale 2 puffs into the lungs 2 (two) times daily as needed (cough/respiratory issues.).     [provider]  diclofenac sodium (VOLTAREN) 1 % GEL Apply 1 application topically 4 (four) times daily as needed (neck pain).     [provider]  ezetimibe-simvastatin (VYTORIN) 10-40 MG per tablet Take 1 tablet by mouth at bedtime.     [provider]  ferric citrate (AURYXIA) 1 GM 210 MG(Fe) tablet Take 210-630 mg by mouth See admin instructions. Take 3 tablets (630 mg) by mouth 3 times daily with meals & take 1 tablet (210 mg) by mouth with each snack 11/01/19   [provider]  fexofenadine (ALLEGRA) 180 MG tablet Take 180 mg by mouth daily.    [provider]  guaiFENesin (MUCINEX) 600 MG 12 hr tablet Take 600 mg by mouth in the morning and at bedtime.     [provider]  insulin lispro (HUMALOG) 100 UNIT/ML injection Inject 4-10 Units into the skin 3 (three) times daily before meals. Sliding Scale    [provider]  IRON SUCROSE IV Inject 1 Dose into the vein as needed.    [provider]  ketotifen (ZADITOR) 0.025 % ophthalmic solution Place 1 drop into both eyes 2 (two) times daily as needed (allergy/dry/irritated eyes.).    [provider]  LANTUS SOLOSTAR 100 UNIT/ML Solostar Pen Inject 18 Units into the skin at bedtime.  04/19/18   [provider]  losartan (COZAAR) 25 MG tablet Take 12.5 mg by mouth daily. 07/11/20   [provider]  Magnesium 200 MG TABS Take 0.5 tablets (100 mg total) by mouth 2 (two) times daily.    [provider]  Methoxy PEG-Epoetin Beta (MIRCERA IJ) Mircera 02/14/20 02/12/21  [provider]  midodrine (PROAMATINE) 10 MG tablet Take 10 mg by mouth 3 (three) times a week. Pre dialysis    [provider]  multivitamin (RENA-VIT) TABS tablet Take 1 tablet by mouth daily with breakfast.  11/01/19   [provider]  nitroGLYCERIN (NITROSTAT) 0.4 MG SL tablet Place 1 tablet (0.4 mg total) under the tongue every 5 (five) minutes x 3 doses as needed for chest pain. 03/14/20   Bensimhon, Shaune Pascal, MD  simethicone (MYLICON) 80 MG chewable tablet Chew 160 mg by mouth daily as needed for flatulence (indigestion.).  [provider]  VITAMIN D, CHOLECALCIFEROL, PO Take by mouth 3 (three) times a week. With dialysis    [provider]   Physical Exam: Vitals:   01/18/21 0130 01/18/21 0145 01/18/21 0200 01/18/21 0230  BP: (!) 143/74 137/72 129/67 138/71  Pulse:  (!) 102    Resp: 20 (!) 21 (!) 21 20  Temp:      SpO2:  96%     Constitutional: Looks chronically ill, but currently in NAD. Eyes: PERRL, lids and conjunctivae normal ENMT: Mucous membranes and lips are dry.  Posterior pharynx clear of any exudate or lesions. Neck: normal, supple, no masses, no thyromegaly Respiratory: Decreased breath sounds in bases, otherwise clear to auscultation bilaterally, no wheezing, no crackles. Normal respiratory effort. No accessory muscle use.  Cardiovascular: Regular rate and rhythm, no murmurs / rubs / gallops.  Right thigh AV f graft with good thrill, no extremity edema. 2+ pedal pulses. No carotid bruits.  Abdomen: No distention.  Bowel sounds positive.  Soft, no tenderness, no masses palpated. No hepatosplenomegaly. Musculoskeletal: Moderate generalized weakness.  No clubbing / cyanosis.  Good ROM, no contractures. Normal muscle tone.  Skin: no rashes, lesions, ulcers on very limited dermatological examination. Neurologic: CN 2-12 grossly intact. Sensation intact, DTR normal. Strength 5/5 in all 4.  Psychiatric: Normal judgment and insight. Alert and oriented x 3. Normal mood.   Labs on Admission: I have personally reviewed following labs and imaging  studies  CBC: Recent Labs  Lab 01/17/21 1805  WBC 9.4  NEUTROABS 7.1  HGB 11.0*  HCT 31.1*  MCV 82.7  PLT PLATELET CLUMPS NOTED ON SMEAR, UNABLE TO ESTIMATE    Basic Metabolic Panel: Recent Labs  Lab 01/17/21 1805  NA 137  K 4.1  CL 89*  CO2 30  GLUCOSE 159*  BUN 33*  CREATININE 6.43*  CALCIUM 8.9    GFR: CrCl cannot be calculated (Unknown ideal weight.).  Liver Function Tests: Recent Labs  Lab 01/17/21 1805  AST 41  ALT 67*  ALKPHOS 242*  BILITOT 2.1*  PROT 7.1  ALBUMIN 3.3*   Radiological Exams on Admission: DG Chest 1 View  Result Date: 01/17/2021 CLINICAL DATA:  Weakness. EXAM: CHEST  1 VIEW COMPARISON:  March 19, 2019. FINDINGS: Stable cardiomediastinal silhouette. No pneumothorax is noted. Hypoinflation of the lungs is noted with minimal bibasilar subsegmental atelectasis. Bony thorax is unremarkable. IMPRESSION: Hypoinflation of the lungs is noted with minimal bibasilar subsegmental atelectasis. Electronically Signed   By: Marijo Conception M.D.   On: 01/17/2021 18:29   CT Angio Chest PE W and/or Wo Contrast  Addendum Date: 01/18/2021   ADDENDUM REPORT: 01/18/2021 00:36 ADDENDUM: 8 mm ground-glass opacity seen in the left lung apex. Possibly an acute infectious or inflammatory process though neoplasm is not fully excluded. Initial follow-up with CT at 6-12 months is recommended to confirm persistence. If persistent, repeat CT is recommended every 2 years until 5 years of stability has been established. This recommendation follows the consensus statement: Guidelines for Management of Incidental Pulmonary Nodules Detected on CT Images: From the Fleischner Society 2017; Radiology 2017; 284:228-243. Electronically Signed   By: Lovena Le M.D.   On: 01/18/2021 00:36   Result Date: 01/18/2021 CLINICAL DATA:  Chest discomfort, nausea and vomiting EXAM: CT ANGIOGRAPHY CHEST CT ABDOMEN AND PELVIS WITH CONTRAST TECHNIQUE: Multidetector CT imaging of the chest was  performed using the standard protocol during bolus administration of intravenous contrast. Multiplanar CT image reconstructions and MIPs were obtained  to evaluate the vascular anatomy. Multidetector CT imaging of the abdomen and pelvis was performed using the standard protocol during bolus administration of intravenous contrast. CONTRAST:  64mL OMNIPAQUE IOHEXOL 350 MG/ML SOLN COMPARISON:  Radiograph 01/17/2021 some ultrasound abdomen 04/21/2018 FINDINGS: CTA CHEST FINDINGS Cardiovascular: Satisfactory opacification the pulmonary arteries to the segmental level. No pulmonary artery filling defects are identified. Central pulmonary arteries are normal caliber. Cardiomegaly. Small pericardial effusion. Suboptimal opacification of the thoracic aorta for luminal assessment. Atherosclerotic plaque within the normal caliber aorta. No gross acute aortic abnormality is evident. Normal 3 vessel branching of the aortic arch. Proximal great vessels are unremarkable. Mediastinum/Nodes: No mediastinal fluid or gas. Normal thyroid gland and thoracic inlet. No acute abnormality of the trachea or esophagus. No worrisome mediastinal, hilar or axillary adenopathy. Lungs/Pleura: Low lung volumes and mixed dependent and subsegmental atelectatic changes. 8 mm ground-glass opacity seen in left upper lobe (4/25). No other consolidative opacities in the lungs. No pneumothorax. No layering effusion. No other concerning nodules or masses. Musculoskeletal: No acute osseous abnormality or suspicious osseous lesion. Exaggerated thoracic kyphosis with multilevel bony fusion across the vertebral bodies and posterior elements noted T9-T12 as well as more pronounced spondylitic changes at the adjacent T7-T8, T8-T9 levels. Prior cervical fusion noted as well. No acute or worrisome osseous abnormality. Minimal degenerative changes of the shoulders. No worrisome chest wall mass or lesion. Diffuse body wall edema. Review of the MIP images confirms the  above findings. CT ABDOMEN and PELVIS FINDINGS Hepatobiliary: No worrisome focal liver lesions. Smooth liver surface contour. Normal hepatic attenuation. Gallbladder is mildly distended with some layering hyperdense material, could reflect biliary sand or if patient has received additional contrast media in recent days, possible vicarious extravasation of contrast media. No visible intraductal gallstones or significant biliary ductal dilatation. Pancreas: Normal tapering of the distal common bile duct. No pancreatic inflammation or discernible pancreatic lesion. Spleen: Normal in size. No concerning splenic lesions. Small accessory splenule posteriorly. Adrenals/Urinary Tract: Normal adrenals. Kidneys are normally located with symmetric enhancement and excretion. Fluid attenuation cyst seen in the upper pole right kidney measuring up to 2.2 cm in size. No suspicious renal lesion, urolithiasis or hydronephrosis. Urinary bladder is largely decompressed at the time of exam and therefore poorly evaluated by CT imaging. No gross bladder abnormality accounting for underdistention. Stomach/Bowel: Distal esophagus unremarkable. Stomach and duodenum are free of acute abnormality with a normal duodenal sweep across the midline abdomen. No small bowel thickening or dilatation. No evidence of high-grade small bowel obstruction. No clear colonic thickening or dilatation accounting for varying degrees of underdistention. Appendix is not visualized. No focal inflammation the vicinity of the cecum to suggest an occult appendicitis. Vascular/Lymphatic: Atherosclerotic calcifications within the abdominal aorta and branch vessels. No aneurysm or ectasia. Small amount of fluid seen surrounding the origin of a right common femoral arteriovenous fistula graft (6/68). No pathologically enlarged abdominopelvic lymph nodes. Reproductive: Retroverted uterus with calcified uterine fibroids. No concerning adnexal mass. Other: Diffuse body wall  edema. Mild edematous changes in the mesentery. No abdominopelvic free air or fluid. No bowel containing hernia. Postsurgical changes of the anterior abdominal wall including a small umbilical hernia into which protrudes portion of the anti mesenteric transverse colon (6/39) albeit without evidence of vascular compromise or obstruction at this level. Musculoskeletal: Diffuse osseous manifestations of renal osteodystrophy. Grade 2 anterolisthesis L4 on L5 without spondylolysis. Multilevel discogenic and facet degenerative changes are maximal at the L4-5 level as well. Additional degenerative changes in the bilateral hips  and additional arthrosis at the SI joints and symphysis pubis. Review of the MIP images confirms the above findings. IMPRESSION: 1. No evidence of pulmonary artery embolism. 2. Cardiomegaly and small pericardial effusion. 3. Diffuse body wall edema could reflect early volume overload/anasarca. 4. Distended gallbladder with layering hyperdensity, could reflect tiny biliary stones/sand or vicarious extravasation of contrast if patient has had a recent contrasted exam. While there is no pericholecystic fluid or inflammation, could correlate for right upper quadrant symptoms as isolated distension can be an early sign of developing cholecystitis. No biliary ductal dilatation or intraductal gallstones. 5. Small amount of fluid surrounding the anastomosis of a right femoral AV fistula in the right inguinal chain. Could reflect small chronic postoperative seroma though should correlate fistula function and local assessment of the inguinal region. 6. Small broad-based umbilical hernia into which protrudes portion of the anti mesenteric transverse colon without evidence of vascular compromise or obstruction. 7. Diffuse osseous manifestations of renal osteodystrophy. Electronically Signed: By: Lovena Le M.D. On: 01/18/2021 00:18   CT ABDOMEN PELVIS W CONTRAST  Addendum Date: 01/18/2021   ADDENDUM  REPORT: 01/18/2021 00:36 ADDENDUM: 8 mm ground-glass opacity seen in the left lung apex. Possibly an acute infectious or inflammatory process though neoplasm is not fully excluded. Initial follow-up with CT at 6-12 months is recommended to confirm persistence. If persistent, repeat CT is recommended every 2 years until 5 years of stability has been established. This recommendation follows the consensus statement: Guidelines for Management of Incidental Pulmonary Nodules Detected on CT Images: From the Fleischner Society 2017; Radiology 2017; 284:228-243. Electronically Signed   By: Lovena Le M.D.   On: 01/18/2021 00:36   Result Date: 01/18/2021 CLINICAL DATA:  Chest discomfort, nausea and vomiting EXAM: CT ANGIOGRAPHY CHEST CT ABDOMEN AND PELVIS WITH CONTRAST TECHNIQUE: Multidetector CT imaging of the chest was performed using the standard protocol during bolus administration of intravenous contrast. Multiplanar CT image reconstructions and MIPs were obtained to evaluate the vascular anatomy. Multidetector CT imaging of the abdomen and pelvis was performed using the standard protocol during bolus administration of intravenous contrast. CONTRAST:  57mL OMNIPAQUE IOHEXOL 350 MG/ML SOLN COMPARISON:  Radiograph 01/17/2021 some ultrasound abdomen 04/21/2018 FINDINGS: CTA CHEST FINDINGS Cardiovascular: Satisfactory opacification the pulmonary arteries to the segmental level. No pulmonary artery filling defects are identified. Central pulmonary arteries are normal caliber. Cardiomegaly. Small pericardial effusion. Suboptimal opacification of the thoracic aorta for luminal assessment. Atherosclerotic plaque within the normal caliber aorta. No gross acute aortic abnormality is evident. Normal 3 vessel branching of the aortic arch. Proximal great vessels are unremarkable. Mediastinum/Nodes: No mediastinal fluid or gas. Normal thyroid gland and thoracic inlet. No acute abnormality of the trachea or esophagus. No worrisome  mediastinal, hilar or axillary adenopathy. Lungs/Pleura: Low lung volumes and mixed dependent and subsegmental atelectatic changes. 8 mm ground-glass opacity seen in left upper lobe (4/25). No other consolidative opacities in the lungs. No pneumothorax. No layering effusion. No other concerning nodules or masses. Musculoskeletal: No acute osseous abnormality or suspicious osseous lesion. Exaggerated thoracic kyphosis with multilevel bony fusion across the vertebral bodies and posterior elements noted T9-T12 as well as more pronounced spondylitic changes at the adjacent T7-T8, T8-T9 levels. Prior cervical fusion noted as well. No acute or worrisome osseous abnormality. Minimal degenerative changes of the shoulders. No worrisome chest wall mass or lesion. Diffuse body wall edema. Review of the MIP images confirms the above findings. CT ABDOMEN and PELVIS FINDINGS Hepatobiliary: No worrisome focal liver lesions. Smooth liver  surface contour. Normal hepatic attenuation. Gallbladder is mildly distended with some layering hyperdense material, could reflect biliary sand or if patient has received additional contrast media in recent days, possible vicarious extravasation of contrast media. No visible intraductal gallstones or significant biliary ductal dilatation. Pancreas: Normal tapering of the distal common bile duct. No pancreatic inflammation or discernible pancreatic lesion. Spleen: Normal in size. No concerning splenic lesions. Small accessory splenule posteriorly. Adrenals/Urinary Tract: Normal adrenals. Kidneys are normally located with symmetric enhancement and excretion. Fluid attenuation cyst seen in the upper pole right kidney measuring up to 2.2 cm in size. No suspicious renal lesion, urolithiasis or hydronephrosis. Urinary bladder is largely decompressed at the time of exam and therefore poorly evaluated by CT imaging. No gross bladder abnormality accounting for underdistention. Stomach/Bowel: Distal  esophagus unremarkable. Stomach and duodenum are free of acute abnormality with a normal duodenal sweep across the midline abdomen. No small bowel thickening or dilatation. No evidence of high-grade small bowel obstruction. No clear colonic thickening or dilatation accounting for varying degrees of underdistention. Appendix is not visualized. No focal inflammation the vicinity of the cecum to suggest an occult appendicitis. Vascular/Lymphatic: Atherosclerotic calcifications within the abdominal aorta and branch vessels. No aneurysm or ectasia. Small amount of fluid seen surrounding the origin of a right common femoral arteriovenous fistula graft (6/68). No pathologically enlarged abdominopelvic lymph nodes. Reproductive: Retroverted uterus with calcified uterine fibroids. No concerning adnexal mass. Other: Diffuse body wall edema. Mild edematous changes in the mesentery. No abdominopelvic free air or fluid. No bowel containing hernia. Postsurgical changes of the anterior abdominal wall including a small umbilical hernia into which protrudes portion of the anti mesenteric transverse colon (6/39) albeit without evidence of vascular compromise or obstruction at this level. Musculoskeletal: Diffuse osseous manifestations of renal osteodystrophy. Grade 2 anterolisthesis L4 on L5 without spondylolysis. Multilevel discogenic and facet degenerative changes are maximal at the L4-5 level as well. Additional degenerative changes in the bilateral hips and additional arthrosis at the SI joints and symphysis pubis. Review of the MIP images confirms the above findings. IMPRESSION: 1. No evidence of pulmonary artery embolism. 2. Cardiomegaly and small pericardial effusion. 3. Diffuse body wall edema could reflect early volume overload/anasarca. 4. Distended gallbladder with layering hyperdensity, could reflect tiny biliary stones/sand or vicarious extravasation of contrast if patient has had a recent contrasted exam. While there is  no pericholecystic fluid or inflammation, could correlate for right upper quadrant symptoms as isolated distension can be an early sign of developing cholecystitis. No biliary ductal dilatation or intraductal gallstones. 5. Small amount of fluid surrounding the anastomosis of a right femoral AV fistula in the right inguinal chain. Could reflect small chronic postoperative seroma though should correlate fistula function and local assessment of the inguinal region. 6. Small broad-based umbilical hernia into which protrudes portion of the anti mesenteric transverse colon without evidence of vascular compromise or obstruction. 7. Diffuse osseous manifestations of renal osteodystrophy. Electronically Signed: By: Lovena Le M.D. On: 01/18/2021 00:18    EKG: Independently reviewed.  Vent. rate 123 BPM PR interval 160 ms QRS duration 88 ms QT/QTcB 314/449 ms P-R-T axes 34 -61 28 Sinus tachycardia Left axis deviation Inferior infarct , age undetermined Anteroseptal infarct , age undetermined Abnormal ECG  Assessment/Plan Principal Problem:   Sepsis due to undetermined organism POA Observation/telemetry. No clear respiratory symptomatology. No RUQ abdominal pain and earlier GI symptoms resolved. Continue cefepime per pharmacy. Continue vancomycin per pharmacy. Add metronidazole 500 mg IVPB every 8 hours. Follow  blood cultures and sensitivity. Check procalcitonin level with morning labs. Follow-up CBC and liver function tests later today.  Active Problems:   Hypoxia There is suspicion for pneumonia. Continue supplemental oxygen. Continue cefepime and vancomycin per pharmacy.    Abnormal liver function tests Check RUQ ultrasound. Recheck LFTs later today. Continue cefepime and vancomycin. Added metronidazole 500 mg IVP every 8 hours.    Essential hypertension Was on losartan and midodrine. Continue to hold both pending med reconciliation. Continue blood pressure monitoring.     Chronic systolic (congestive) heart failure (HCC) No signs of decompensation at this time. Volume status being managed with HD.    DM (diabetes mellitus), type 2 with renal complications (HCC) Carbohydrate modified diet. Continue Lantus. CBG monitoring with RI SS. Check hemoglobin A1c.    ESRD on hemodialysis (HCC) On TU, TH and SA schedule. Diet fluid restriction per protocol. Received IV contrast for CT scan. Consult nephrology when HD is needed.    Anemia due to end stage renal disease (HCC) Monitor hematocrit and hemoglobin. Erythropoietin per nephrology.   DVT prophylaxis: Heparin SQ. Code Status:   Full code. Family Communication:   Disposition Plan:   Patient is from:  Home.  Anticipated DC to:  Home.  Anticipated DC date:  01/19/2021 or 01/20/2021.  Anticipated DC barriers: Clinical status.  Consults called:   Admission status:  Observation/telemetry.   Severity of Illness: High severity after presenting with sepsis due to undetermined organism with suspicion for early acute cholecystitis.  The patient will need to remain in the hospital for at least 24 to 48 hours IV antibiotic therapy and further work-up.  Reubin Milan MD [facility]  How to contact the [facility] Attending or Consulting provider [place] or covering provider during after hours [place], for this patient?   Check the care team in [facility] and look for a) attending/consulting [facility] provider listed and b) the [facility] team listed Log into [email or web address] and use [place]'s universal password to access. If you do not have the password, please contact the hospital operator. Locate the [facility] provider you are looking for under [facility] and page to a number that you can be directly reached. If you still have difficulty reaching the provider, please page the [facility] (Director on Call) for the Hospitalists listed on amion for assistance.  01/18/2021, 2:49 AM   This document was prepared using Dragon voice  recognition software and may contain some unintended transcription errors.

## 2021-01-18 NOTE — ED Provider Notes (Signed)
12:42 AM Care assumed from [place], [place] at shift change. In short, 70 y/o female with hx of ESRD, CHF c/o generalized weakness x 1 week with N/V/D. Noted to be hypoxic at dialysis. Now with 2L O2 requirement, though no URI symptoms or cough.   CTA reviewed which is negative for PE. There is evidence of an 8 mm ground-glass opacity seen in the left lung apex. Per radiology, possibly an acute infectious or inflammatory process though neoplasm is not fully excluded.  At this time, will admit for sepsis presumed 2/2 PNA. Started on IV abx shortly upon rooming in the ED. Consult placed to [facility].  2:17 AM Dr. Olevia Bowens of [facility] to admit.   Results for orders placed or performed during the hospital encounter of 01/17/21  Resp Panel by RT-PCR (Flu A&B, Covid) Nasopharyngeal Swab   Specimen: Nasopharyngeal Swab; Nasopharyngeal(NP) swabs in vial transport medium  Result Value Ref Range   SARS Coronavirus 2 by RT PCR NEGATIVE NEGATIVE   Influenza A by PCR NEGATIVE NEGATIVE   Influenza B by PCR NEGATIVE NEGATIVE  Blood culture (routine x 2)   Specimen: BLOOD LEFT HAND  Result Value Ref Range   Specimen Description BLOOD LEFT HAND    Special Requests      BOTTLES DRAWN AEROBIC AND ANAEROBIC Blood Culture results may not be optimal due to an inadequate volume of blood received in culture bottles Performed at [facility] [address] [place], [place] [address]    Culture PENDING    Report Status PENDING   Lactic acid, plasma  Result Value Ref Range   Lactic Acid, Venous 2.3 (HH) 0.5 - 1.9 mmol/L  Comprehensive metabolic panel  Result Value Ref Range   Sodium 137 135 - 145 mmol/L   Potassium 4.1 3.5 - 5.1 mmol/L   Chloride 89 (L) 98 - 111 mmol/L   CO2 30 22 - 32 mmol/L   Glucose, Bld 159 (H) 70 - 99 mg/dL   BUN 33 (H) 8 - 23 mg/dL   Creatinine, Ser 6.43 (H) 0.44 - 1.00 mg/dL   Calcium 8.9 8.9 - 10.3 mg/dL   Total Protein 7.1 6.5 - 8.1 g/dL   Albumin 3.3 (L) 3.5 - 5.0 g/dL   AST 41 15 -  41 U/L   ALT 67 (H) 0 - 44 U/L   Alkaline Phosphatase 242 (H) 38 - 126 U/L   Total Bilirubin 2.1 (H) 0.3 - 1.2 mg/dL   GFR, Estimated 7 (L) >60 mL/min   Anion gap 18 (H) 5 - 15  CBC WITH DIFFERENTIAL  Result Value Ref Range   WBC 9.4 4.0 - 10.5 K/uL   RBC 3.76 (L) 3.87 - 5.11 MIL/uL   Hemoglobin 11.0 (L) 12.0 - 15.0 g/dL   HCT 31.1 (L) 36.0 - 46.0 %   MCV 82.7 80.0 - 100.0 fL   MCH 29.3 26.0 - 34.0 pg   MCHC 35.4 30.0 - 36.0 g/dL   RDW 19.1 (H) 11.5 - 15.5 %   Platelets PLATELET CLUMPS NOTED ON SMEAR, UNABLE TO ESTIMATE 150 - 400 K/uL   nRBC 0.0 0.0 - 0.2 %   Neutrophils Relative % 75 %   Neutro Abs 7.1 1.7 - 7.7 K/uL   Lymphocytes Relative 12 %   Lymphs Abs 1.1 0.7 - 4.0 K/uL   Monocytes Relative 10 %   Monocytes Absolute 1.0 0.1 - 1.0 K/uL   Eosinophils Relative 1 %   Eosinophils Absolute 0.1 0.0 - 0.5 K/uL  Basophils Relative 1 %   Basophils Absolute 0.1 0.0 - 0.1 K/uL   Immature Granulocytes 1 %   Abs Immature Granulocytes 0.10 (H) 0.00 - 0.07 K/uL  Protime-INR  Result Value Ref Range   Prothrombin Time 13.5 11.4 - 15.2 seconds   INR 1.0 0.8 - 1.2  APTT  Result Value Ref Range   aPTT 29 24 - 36 seconds  D-dimer, quantitative  Result Value Ref Range   D-Dimer, Quant 4.66 (H) 0.00 - 0.50 ug/mL-FEU  Lactic acid, plasma  Result Value Ref Range   Lactic Acid, Venous 1.3 0.5 - 1.9 mmol/L  CBG monitoring, ED  Result Value Ref Range   Glucose-Capillary 164 (H) 70 - 99 mg/dL   DG Chest 1 View  Result Date: 01/17/2021 CLINICAL DATA:  Weakness. EXAM: CHEST  1 VIEW COMPARISON:  March 19, 2019. FINDINGS: Stable cardiomediastinal silhouette. No pneumothorax is noted. Hypoinflation of the lungs is noted with minimal bibasilar subsegmental atelectasis. Bony thorax is unremarkable. IMPRESSION: Hypoinflation of the lungs is noted with minimal bibasilar subsegmental atelectasis. Electronically Signed   By: Marijo Conception M.D.   On: 01/17/2021 18:29   CT Angio Chest PE W  and/or Wo Contrast  Addendum Date: 01/18/2021   ADDENDUM REPORT: 01/18/2021 00:36 ADDENDUM: 8 mm ground-glass opacity seen in the left lung apex. Possibly an acute infectious or inflammatory process though neoplasm is not fully excluded. Initial follow-up with CT at 6-12 months is recommended to confirm persistence. If persistent, repeat CT is recommended every 2 years until 5 years of stability has been established. This recommendation follows the consensus statement: Guidelines for Management of Incidental Pulmonary Nodules Detected on CT Images: From the Fleischner Society 2017; Radiology 2017; 284:228-243. Electronically Signed   By: Lovena Le M.D.   On: 01/18/2021 00:36   Result Date: 01/18/2021 CLINICAL DATA:  Chest discomfort, nausea and vomiting EXAM: CT ANGIOGRAPHY CHEST CT ABDOMEN AND PELVIS WITH CONTRAST TECHNIQUE: Multidetector CT imaging of the chest was performed using the standard protocol during bolus administration of intravenous contrast. Multiplanar CT image reconstructions and MIPs were obtained to evaluate the vascular anatomy. Multidetector CT imaging of the abdomen and pelvis was performed using the standard protocol during bolus administration of intravenous contrast. CONTRAST:  66mL OMNIPAQUE IOHEXOL 350 MG/ML SOLN COMPARISON:  Radiograph 01/17/2021 some ultrasound abdomen 04/21/2018 FINDINGS: CTA CHEST FINDINGS Cardiovascular: Satisfactory opacification the pulmonary arteries to the segmental level. No pulmonary artery filling defects are identified. Central pulmonary arteries are normal caliber. Cardiomegaly. Small pericardial effusion. Suboptimal opacification of the thoracic aorta for luminal assessment. Atherosclerotic plaque within the normal caliber aorta. No gross acute aortic abnormality is evident. Normal 3 vessel branching of the aortic arch. Proximal great vessels are unremarkable. Mediastinum/Nodes: No mediastinal fluid or gas. Normal thyroid gland and thoracic inlet. No  acute abnormality of the trachea or esophagus. No worrisome mediastinal, hilar or axillary adenopathy. Lungs/Pleura: Low lung volumes and mixed dependent and subsegmental atelectatic changes. 8 mm ground-glass opacity seen in left upper lobe (4/25). No other consolidative opacities in the lungs. No pneumothorax. No layering effusion. No other concerning nodules or masses. Musculoskeletal: No acute osseous abnormality or suspicious osseous lesion. Exaggerated thoracic kyphosis with multilevel bony fusion across the vertebral bodies and posterior elements noted T9-T12 as well as more pronounced spondylitic changes at the adjacent T7-T8, T8-T9 levels. Prior cervical fusion noted as well. No acute or worrisome osseous abnormality. Minimal degenerative changes of the shoulders. No worrisome chest wall mass or lesion. Diffuse body  wall edema. Review of the MIP images confirms the above findings. CT ABDOMEN and PELVIS FINDINGS Hepatobiliary: No worrisome focal liver lesions. Smooth liver surface contour. Normal hepatic attenuation. Gallbladder is mildly distended with some layering hyperdense material, could reflect biliary sand or if patient has received additional contrast media in recent days, possible vicarious extravasation of contrast media. No visible intraductal gallstones or significant biliary ductal dilatation. Pancreas: Normal tapering of the distal common bile duct. No pancreatic inflammation or discernible pancreatic lesion. Spleen: Normal in size. No concerning splenic lesions. Small accessory splenule posteriorly. Adrenals/Urinary Tract: Normal adrenals. Kidneys are normally located with symmetric enhancement and excretion. Fluid attenuation cyst seen in the upper pole right kidney measuring up to 2.2 cm in size. No suspicious renal lesion, urolithiasis or hydronephrosis. Urinary bladder is largely decompressed at the time of exam and therefore poorly evaluated by CT imaging. No gross bladder abnormality  accounting for underdistention. Stomach/Bowel: Distal esophagus unremarkable. Stomach and duodenum are free of acute abnormality with a normal duodenal sweep across the midline abdomen. No small bowel thickening or dilatation. No evidence of high-grade small bowel obstruction. No clear colonic thickening or dilatation accounting for varying degrees of underdistention. Appendix is not visualized. No focal inflammation the vicinity of the cecum to suggest an occult appendicitis. Vascular/Lymphatic: Atherosclerotic calcifications within the abdominal aorta and branch vessels. No aneurysm or ectasia. Small amount of fluid seen surrounding the origin of a right common femoral arteriovenous fistula graft (6/68). No pathologically enlarged abdominopelvic lymph nodes. Reproductive: Retroverted uterus with calcified uterine fibroids. No concerning adnexal mass. Other: Diffuse body wall edema. Mild edematous changes in the mesentery. No abdominopelvic free air or fluid. No bowel containing hernia. Postsurgical changes of the anterior abdominal wall including a small umbilical hernia into which protrudes portion of the anti mesenteric transverse colon (6/39) albeit without evidence of vascular compromise or obstruction at this level. Musculoskeletal: Diffuse osseous manifestations of renal osteodystrophy. Grade 2 anterolisthesis L4 on L5 without spondylolysis. Multilevel discogenic and facet degenerative changes are maximal at the L4-5 level as well. Additional degenerative changes in the bilateral hips and additional arthrosis at the SI joints and symphysis pubis. Review of the MIP images confirms the above findings. IMPRESSION: 1. No evidence of pulmonary artery embolism. 2. Cardiomegaly and small pericardial effusion. 3. Diffuse body wall edema could reflect early volume overload/anasarca. 4. Distended gallbladder with layering hyperdensity, could reflect tiny biliary stones/sand or vicarious extravasation of contrast if  patient has had a recent contrasted exam. While there is no pericholecystic fluid or inflammation, could correlate for right upper quadrant symptoms as isolated distension can be an early sign of developing cholecystitis. No biliary ductal dilatation or intraductal gallstones. 5. Small amount of fluid surrounding the anastomosis of a right femoral AV fistula in the right inguinal chain. Could reflect small chronic postoperative seroma though should correlate fistula function and local assessment of the inguinal region. 6. Small broad-based umbilical hernia into which protrudes portion of the anti mesenteric transverse colon without evidence of vascular compromise or obstruction. 7. Diffuse osseous manifestations of renal osteodystrophy. Electronically Signed: By: Lovena Le M.D. On: 01/18/2021 00:18   CT ABDOMEN PELVIS W CONTRAST  Addendum Date: 01/18/2021   ADDENDUM REPORT: 01/18/2021 00:36 ADDENDUM: 8 mm ground-glass opacity seen in the left lung apex. Possibly an acute infectious or inflammatory process though neoplasm is not fully excluded. Initial follow-up with CT at 6-12 months is recommended to confirm persistence. If persistent, repeat CT is recommended every 2 years until  5 years of stability has been established. This recommendation follows the consensus statement: Guidelines for Management of Incidental Pulmonary Nodules Detected on CT Images: From the Fleischner Society 2017; Radiology 2017; 284:228-243. Electronically Signed   By: Lovena Le M.D.   On: 01/18/2021 00:36   Result Date: 01/18/2021 CLINICAL DATA:  Chest discomfort, nausea and vomiting EXAM: CT ANGIOGRAPHY CHEST CT ABDOMEN AND PELVIS WITH CONTRAST TECHNIQUE: Multidetector CT imaging of the chest was performed using the standard protocol during bolus administration of intravenous contrast. Multiplanar CT image reconstructions and MIPs were obtained to evaluate the vascular anatomy. Multidetector CT imaging of the abdomen and pelvis  was performed using the standard protocol during bolus administration of intravenous contrast. CONTRAST:  37mL OMNIPAQUE IOHEXOL 350 MG/ML SOLN COMPARISON:  Radiograph 01/17/2021 some ultrasound abdomen 04/21/2018 FINDINGS: CTA CHEST FINDINGS Cardiovascular: Satisfactory opacification the pulmonary arteries to the segmental level. No pulmonary artery filling defects are identified. Central pulmonary arteries are normal caliber. Cardiomegaly. Small pericardial effusion. Suboptimal opacification of the thoracic aorta for luminal assessment. Atherosclerotic plaque within the normal caliber aorta. No gross acute aortic abnormality is evident. Normal 3 vessel branching of the aortic arch. Proximal great vessels are unremarkable. Mediastinum/Nodes: No mediastinal fluid or gas. Normal thyroid gland and thoracic inlet. No acute abnormality of the trachea or esophagus. No worrisome mediastinal, hilar or axillary adenopathy. Lungs/Pleura: Low lung volumes and mixed dependent and subsegmental atelectatic changes. 8 mm ground-glass opacity seen in left upper lobe (4/25). No other consolidative opacities in the lungs. No pneumothorax. No layering effusion. No other concerning nodules or masses. Musculoskeletal: No acute osseous abnormality or suspicious osseous lesion. Exaggerated thoracic kyphosis with multilevel bony fusion across the vertebral bodies and posterior elements noted T9-T12 as well as more pronounced spondylitic changes at the adjacent T7-T8, T8-T9 levels. Prior cervical fusion noted as well. No acute or worrisome osseous abnormality. Minimal degenerative changes of the shoulders. No worrisome chest wall mass or lesion. Diffuse body wall edema. Review of the MIP images confirms the above findings. CT ABDOMEN and PELVIS FINDINGS Hepatobiliary: No worrisome focal liver lesions. Smooth liver surface contour. Normal hepatic attenuation. Gallbladder is mildly distended with some layering hyperdense material, could  reflect biliary sand or if patient has received additional contrast media in recent days, possible vicarious extravasation of contrast media. No visible intraductal gallstones or significant biliary ductal dilatation. Pancreas: Normal tapering of the distal common bile duct. No pancreatic inflammation or discernible pancreatic lesion. Spleen: Normal in size. No concerning splenic lesions. Small accessory splenule posteriorly. Adrenals/Urinary Tract: Normal adrenals. Kidneys are normally located with symmetric enhancement and excretion. Fluid attenuation cyst seen in the upper pole right kidney measuring up to 2.2 cm in size. No suspicious renal lesion, urolithiasis or hydronephrosis. Urinary bladder is largely decompressed at the time of exam and therefore poorly evaluated by CT imaging. No gross bladder abnormality accounting for underdistention. Stomach/Bowel: Distal esophagus unremarkable. Stomach and duodenum are free of acute abnormality with a normal duodenal sweep across the midline abdomen. No small bowel thickening or dilatation. No evidence of high-grade small bowel obstruction. No clear colonic thickening or dilatation accounting for varying degrees of underdistention. Appendix is not visualized. No focal inflammation the vicinity of the cecum to suggest an occult appendicitis. Vascular/Lymphatic: Atherosclerotic calcifications within the abdominal aorta and branch vessels. No aneurysm or ectasia. Small amount of fluid seen surrounding the origin of a right common femoral arteriovenous fistula graft (6/68). No pathologically enlarged abdominopelvic lymph nodes. Reproductive: Retroverted uterus with calcified uterine  fibroids. No concerning adnexal mass. Other: Diffuse body wall edema. Mild edematous changes in the mesentery. No abdominopelvic free air or fluid. No bowel containing hernia. Postsurgical changes of the anterior abdominal wall including a small umbilical hernia into which protrudes portion of  the anti mesenteric transverse colon (6/39) albeit without evidence of vascular compromise or obstruction at this level. Musculoskeletal: Diffuse osseous manifestations of renal osteodystrophy. Grade 2 anterolisthesis L4 on L5 without spondylolysis. Multilevel discogenic and facet degenerative changes are maximal at the L4-5 level as well. Additional degenerative changes in the bilateral hips and additional arthrosis at the SI joints and symphysis pubis. Review of the MIP images confirms the above findings. IMPRESSION: 1. No evidence of pulmonary artery embolism. 2. Cardiomegaly and small pericardial effusion. 3. Diffuse body wall edema could reflect early volume overload/anasarca. 4. Distended gallbladder with layering hyperdensity, could reflect tiny biliary stones/sand or vicarious extravasation of contrast if patient has had a recent contrasted exam. While there is no pericholecystic fluid or inflammation, could correlate for right upper quadrant symptoms as isolated distension can be an early sign of developing cholecystitis. No biliary ductal dilatation or intraductal gallstones. 5. Small amount of fluid surrounding the anastomosis of a right femoral AV fistula in the right inguinal chain. Could reflect small chronic postoperative seroma though should correlate fistula function and local assessment of the inguinal region. 6. Small broad-based umbilical hernia into which protrudes portion of the anti mesenteric transverse colon without evidence of vascular compromise or obstruction. 7. Diffuse osseous manifestations of renal osteodystrophy. Electronically Signed: By: Lovena Le M.D. On: 01/18/2021 00:18      Antonietta Breach, PA-C 01/18/21 [id number]    Margette Fast, MD 01/20/21 [phone]

## 2021-01-19 ENCOUNTER — Observation Stay (HOSPITAL_COMMUNITY): Payer: Medicare Other

## 2021-01-19 DIAGNOSIS — R7881 Bacteremia: Secondary | ICD-10-CM

## 2021-01-19 DIAGNOSIS — A419 Sepsis, unspecified organism: Secondary | ICD-10-CM | POA: Diagnosis not present

## 2021-01-19 LAB — TROPONIN I (HIGH SENSITIVITY)
Troponin I (High Sensitivity): 37 ng/L — ABNORMAL HIGH (ref <18)
Troponin I (High Sensitivity): 37 ng/L — ABNORMAL HIGH (ref <18)

## 2021-01-19 LAB — CBC
HCT: 25 % — ABNORMAL LOW (ref 36.0–46.0)
Hemoglobin: 8.9 g/dL — ABNORMAL LOW (ref 12.0–15.0)
MCH: 29 pg (ref 26.0–34.0)
MCHC: 35.6 g/dL (ref 30.0–36.0)
MCV: 81.4 fL (ref 80.0–100.0)
Platelets: 88 10*3/uL — ABNORMAL LOW (ref 150–400)
RBC: 3.07 MIL/uL — ABNORMAL LOW (ref 3.87–5.11)
RDW: 18.9 % — ABNORMAL HIGH (ref 11.5–15.5)
WBC: 8.7 10*3/uL (ref 4.0–10.5)
nRBC: 0 % (ref 0.0–0.2)

## 2021-01-19 LAB — BASIC METABOLIC PANEL
Anion gap: 15 (ref 5–15)
BUN: 53 mg/dL — ABNORMAL HIGH (ref 8–23)
CO2: 26 mmol/L (ref 22–32)
Calcium: 8.5 mg/dL — ABNORMAL LOW (ref 8.9–10.3)
Chloride: 88 mmol/L — ABNORMAL LOW (ref 98–111)
Creatinine, Ser: 8.83 mg/dL — ABNORMAL HIGH (ref 0.44–1.00)
GFR, Estimated: 4 mL/min — ABNORMAL LOW (ref >60)
Glucose, Bld: 346 mg/dL — ABNORMAL HIGH (ref 70–99)
Potassium: 3.8 mmol/L (ref 3.5–5.1)
Sodium: 129 mmol/L — ABNORMAL LOW (ref 135–145)

## 2021-01-19 LAB — HEMOGLOBIN A1C
Hgb A1c MFr Bld: 8 % — ABNORMAL HIGH (ref 4.8–5.6)
Mean Plasma Glucose: 183 mg/dL

## 2021-01-19 LAB — ECHOCARDIOGRAM COMPLETE
AR max vel: 2.16 cm2
AV Peak grad: 10.8 mmHg
Ao pk vel: 1.64 m/s
Area-P 1/2: 6.32 cm2
S' Lateral: 2.9 cm

## 2021-01-19 LAB — GLUCOSE, CAPILLARY
Glucose-Capillary: 289 mg/dL — ABNORMAL HIGH (ref 70–99)
Glucose-Capillary: 198 mg/dL — ABNORMAL HIGH (ref 70–99)
Glucose-Capillary: 226 mg/dL — ABNORMAL HIGH (ref 70–99)
Glucose-Capillary: 350 mg/dL — ABNORMAL HIGH (ref 70–99)

## 2021-01-19 LAB — HIV ANTIBODY (ROUTINE TESTING W REFLEX): HIV Screen 4th Generation wRfx: NONREACTIVE

## 2021-01-19 LAB — PROCALCITONIN: Procalcitonin: 36.36 ng/mL

## 2021-01-19 LAB — D-DIMER, QUANTITATIVE: D-Dimer, Quant: 4.33 ug/mL-FEU — ABNORMAL HIGH (ref 0.00–0.50)

## 2021-01-19 MED ORDER — WHITE PETROLATUM EX OINT
TOPICAL_OINTMENT | CUTANEOUS | Status: AC
  Filled 2021-01-19: qty 28.35

## 2021-01-19 MED ORDER — CHLORHEXIDINE GLUCONATE CLOTH 2 % EX PADS
6.0000 | MEDICATED_PAD | Freq: Every day | CUTANEOUS | Status: DC
  Administered 2021-01-19 – 2021-01-24 (×6): 6 via TOPICAL

## 2021-01-19 MED ORDER — INSULIN GLARGINE 100 UNIT/ML ~~LOC~~ SOLN
10.0000 [IU] | Freq: Every day | SUBCUTANEOUS | Status: DC
  Administered 2021-01-19 – 2021-01-20 (×2): 10 [IU] via SUBCUTANEOUS
  Filled 2021-01-19 (×2): qty 0.1

## 2021-01-19 NOTE — Consult Note (Signed)
[facility]    Date of Admission:  01/17/2021     Reason for Consult: granulicatella bsi    Referring Provider: Heath Lark   Lines:  Peripheral iv's Right LE avgraft  Abx: 7/24-c vanc  7/24-25 cefepime/flagyl        Assessment: Granulicatella adiacens bsi Esrd Chf  70 yo female dm2, left TKA h x, esrd on iHD via RLE AV graft, chronic cervical/lumbar back pain admitted from dialysis center for sepsis and hypoxemic respiratory distress found to have granulicatella bsi in setting of 1 week malaise 2 weeks after a dental procedure   Clinicaly presentation concerning for IE. Granulicatella is a nutrition-variant strep that often requires treatment like enterococcus endocarditis in terms of beta-lactam aminoglycoside. It is often susceptible to vanc and less so with ceftriaxone. Sensitivity testing is difficult   Has n/v/diarrhea ?related to sepsis/missing dialysis. Doubt other viral process/tick process. Thrombocytopenia and mild lft probably due to sepsis  Would get TEE and continue vancomycin  She dialyzed via chronic right LE avg. The graft doesn't appear infected at this time She complained of the last 3 weeks very severe thoracic pain as well, will get mri imaging to r/o infection involvement Her left tka is without acute changes concerning for infection   Plan: Continue vancomycin F/u tte Will need tee Will need thoracic mri as well F/u repeat blood culture  If no improvement in thrombocytopenia/diarrhea and worsening lft consider adding doxy and w/u concomittant tick process Discussed with primary team  I spent 60 minute reviewing data/chart, and coordinating care and >50% direct face to face time providing counseling/discussing diagnostics/treatment plan with patient  ------------------------------------------------ Principal Problem:   Sepsis due to undetermined organism [facility]) Active Problems:   Essential hypertension    Chronic systolic (congestive) heart failure (HCC)   DM (diabetes mellitus), type 2 with renal complications ([place])   ESRD on hemodialysis ([place])   Anemia due to end stage renal disease ([place])   Abnormal liver function tests   Hypoxia    HPI: Belinda [name] is a 70 y.o. female 70 y.o. female pmh chf, dm2, hx left tka, esrd on iHD via rle av graft, anxiety, htn/hlp, chronic cspine/lumbar spine pain, admitted from dialysis center with sepsis, found to have [place] bsi    Patient had a dental appointment 3-4 weeks prior to admission. About 3 weeks ago has acute thoracic pain. Had chronic cspine/lumbar pain but no new complaint. No LE neurologic deficit or bowel incontinence. About 1 week ago started feeling ill/malaise/decreased appetite, associated with nausea/vomiting 1-2 days prior to admission and episodes of watery diarrhea  few days prior. She had missed 2 dialysis run prior to making her Saturday dialysis (the day of admission 7/23). Her diarrhea had resolved by the time she was admitted  On admission fever 101s, tachycardic 120s. She was noted in chart to have 80% o2 sat on room air at dialysis center but 92% on room air at the ED (after dialysis) She was started on empiric abx with cefepime/vanc/flagyl Bcx ultimately with granulicatella Mild lft elevation but no leukocytosis Thrombocytopenia 70-80s but unclear baseline; 2020 normal platelet Cta c/a/p done no PE or focal finding outside of cardiomegaly and small pericardial effusion and anasarca.  No tick bite  No chest pain, cough, new back pain, joint pain No rash No headache No visual disturbance  No new sx left tka  Hasn't had issue dialyzing out of her rle avgraft  Family History  Problem Relation Age of Onset   Heart disease Mother    Heart failure Father    Colon cancer Neg Hx    Stomach cancer Neg Hx    Esophageal cancer Neg Hx     Social History   Tobacco Use   Smoking status: Never   Smokeless  tobacco: Never  Vaping Use   Vaping Use: Never used  Substance Use Topics   Alcohol use: Never   Drug use: Never    Allergies  Allergen Reactions   Doxycycline Other (See Comments)   Entresto [Sacubitril-Valsartan] Itching   Metformin Other (See Comments)    weakness   Omnicef [Cefdinir] Other (See Comments)    GI Upset   Aspirin Other (See Comments)    GI issues, Can tolerate low aspirin   Augmentin [Amoxicillin-Pot Clavulanate] Nausea And Vomiting and Other (See Comments)    GI issues DID THE REACTION INVOLVE: Swelling of the face/tongue/throat, SOB, or low BP? No Sudden or severe rash/hives, skin peeling, or the inside of the mouth or nose? No Did it require medical treatment? No When did it last happen?      long time  If all above answers are "NO", may proceed with cephalosporin use.    Erythromycin Nausea And Vomiting and Other (See Comments)    Gi issues   Gabapentin Swelling   Nickel Itching and Rash    Breakouts     Review of Systems: ROS All Other ROS was negative, except mentioned above   Past Medical History:  Diagnosis Date   Acute CHF (congestive heart failure) ([place]) 02/02/2018   Anemia    "get monthly injections to boost my HgB" (02/02/2018)   Anxiety    Asthma    Back pain    intractable low back   Chronic neck pain    CKD (chronic kidney disease), stage III ([place])    on dialysis T/Th/Sa Jeneen Rinks   Depression    Dysrhythmia    palpitations or heart racing periodic. has had for years- Dr Mare Ferrari follows.   GERD (gastroesophageal reflux disease)    History of blood transfusion 2016   After knee replacemnet   Hx MRSA infection    Hyperlipidemia    Hypertension    Migraines    "~ 2/month" (02/02/2018), gets headaches with dialysis   Nerve damage    right hand   Osteoarthritis    "knees, neck" (02/02/2018)   Palpitations    Pneumonia    "twice in 2018" (02/02/2018)   Pulmonary artery hypertension (HCC)    Seasonal allergies    Type II  diabetes mellitus (HCC)    insulin dependent - fasting 140-200        Scheduled Meds:  aspirin EC  81 mg Oral Once per day on Mon Wed Fri   Chlorhexidine Gluconate Cloth  6 each Topical Daily   simvastatin  40 mg Oral QHS   And   ezetimibe  10 mg Oral QHS   heparin injection (subcutaneous)  5,000 Units Subcutaneous Q8H   insulin aspart  0-9 Units Subcutaneous TID WC   insulin glargine  10 Units Subcutaneous Daily   losartan  12.5 mg Oral Daily   midodrine  10 mg Oral Once per day on Mon Wed Fri   mometasone-formoterol  2 puff Inhalation BID   Continuous Infusions:  [START ON 01/20/2021] vancomycin     PRN Meds:.acetaminophen **OR** acetaminophen, ALPRAZolam, guaiFENesin, ondansetron **OR** ondansetron (ZOFRAN) IV   OBJECTIVE: Blood pressure [name])  152/71, pulse 88, temperature (!) 97.4 F (36.3 C), temperature source Oral, resp. rate 19, SpO2 98 %.  Physical Exam General/constitutional: no distress, pleasant HEENT: Normocephalic, PER, Conj Clear, EOMI, Oropharynx clear Neck supple CV: rrr no mrg Lungs: clear to auscultation, normal respiratory effort Abd: Soft, Nontender Ext: no edema Skin: No Rash; rle avg site no swelling/tenderness/redness Neuro: nonfocal MSK: no peripheral joint swelling/tenderness/warmth; left tka site no swelling/warmth; back spines thoracic tenderness around t8-10 Psych: alert/oriented  Central line presence: no   Lab Results Lab Results  Component Value Date   WBC 8.7 01/19/2021   HGB 8.9 (L) 01/19/2021   HCT 25.0 (L) 01/19/2021   MCV 81.4 01/19/2021   PLT 88 (L) 01/19/2021    Lab Results  Component Value Date   CREATININE 8.83 (H) 01/19/2021   BUN 53 (H) 01/19/2021   NA 129 (L) 01/19/2021   K 3.8 01/19/2021   CL 88 (L) 01/19/2021   CO2 26 01/19/2021    Lab Results  Component Value Date   ALT 46 (H) 01/18/2021   AST 31 01/18/2021   ALKPHOS 196 (H) 01/18/2021   BILITOT 1.8 (H) 01/18/2021      Microbiology: Recent Results  (from the past 240 hour(s))  Resp Panel by RT-PCR (Flu A&B, Covid) Nasopharyngeal Swab     Status: None   Collection Time: 01/17/21  5:59 PM   Specimen: Nasopharyngeal Swab; Nasopharyngeal(NP) swabs in vial transport medium  Result Value Ref Range Status   SARS Coronavirus 2 by RT PCR NEGATIVE NEGATIVE Final    Comment: (NOTE) SARS-CoV-2 target nucleic acids are NOT DETECTED.  The SARS-CoV-2 RNA is generally detectable in upper respiratory specimens during the acute phase of infection. The lowest concentration of SARS-CoV-2 viral copies this assay can detect is 138 copies/mL. A negative result does not preclude SARS-Cov-2 infection and should not be used as the sole basis for treatment or other patient management decisions. A negative result may occur with  improper specimen collection/handling, submission of specimen other than nasopharyngeal swab, presence of viral mutation(s) within the areas targeted by this assay, and inadequate number of viral copies(<138 copies/mL). A negative result must be combined with clinical observations, patient history, and epidemiological information. The expected result is Negative.  Fact Sheet for Patients:  EntrepreneurPulse.com.au  Fact Sheet for Healthcare Providers:  IncredibleEmployment.be  This test is no t yet approved or cleared by the [place] FDA and  has been authorized for detection and/or diagnosis of SARS-CoV-2 by FDA under an Emergency Use Authorization (EUA). This EUA will remain  in effect (meaning this test can be used) for the duration of the COVID-19 declaration under Section 564(b)(1) of the Act, 21 U.S.C.section 360bbb-3(b)(1), unless the authorization is terminated  or revoked sooner.       Influenza A by PCR NEGATIVE NEGATIVE Final   Influenza B by PCR NEGATIVE NEGATIVE Final    Comment: (NOTE) The Xpert Xpress SARS-CoV-2/FLU/RSV plus assay is intended as an aid in the  diagnosis of influenza from Nasopharyngeal swab specimens and should not be used as a sole basis for treatment. Nasal washings and aspirates are unacceptable for Xpert Xpress SARS-CoV-2/FLU/RSV testing.  Fact Sheet for Patients: EntrepreneurPulse.com.au  Fact Sheet for Healthcare Providers: IncredibleEmployment.be  This test is not yet approved or cleared by the [place] FDA and has been authorized for detection and/or diagnosis of SARS-CoV-2 by FDA under an Emergency Use Authorization (EUA). This EUA will remain in effect (meaning this test can be used) for  the duration of the COVID-19 declaration under Section 564(b)(1) of the Act, 21 U.S.C. section 360bbb-3(b)(1), unless the authorization is terminated or revoked.  Performed at [facility], [place] [address] [place], [place] [address]   Blood culture (routine x 2)     Status: Abnormal (Preliminary result)   Collection Time: 01/17/21  8:46 PM   Specimen: BLOOD LEFT HAND  Result Value Ref Range Status   Specimen Description BLOOD LEFT HAND  Final   Special Requests   Final    BOTTLES DRAWN AEROBIC AND ANAEROBIC Blood Culture results may not be optimal due to an inadequate volume of blood received in culture bottles   Culture  Setup Time   Final    GRAM POSITIVE COCCI IN PAIRS AND CHAINS IN BOTH AEROBIC AND ANAEROBIC BOTTLES CRITICAL RESULT CALLED TO, READ BACK BY AND VERIFIED WITH: PHARM D J.ORIET ON [id number] AT [id number] BY E.PARRISH    Culture (A)  Final    GRANULICATELLA ADIACENS SUSCEPTIBILITIES TO FOLLOW Performed at [facility], [place] [address], [place], [place] [address]    Report Status PENDING  Incomplete  Blood culture (routine x 2)     Status: Abnormal (Preliminary result)   Collection Time: 01/17/21  8:46 PM   Specimen: BLOOD  Result Value Ref Range Status   Specimen Description BLOOD LEFT ANTECUBITAL  Final   Special Requests   Final    BOTTLES DRAWN AEROBIC AND  ANAEROBIC Blood Culture results may not be optimal due to an inadequate volume of blood received in culture bottles   Culture  Setup Time   Final    GRAM POSITIVE COCCI IN PAIRS AND CHAINS IN BOTH AEROBIC AND ANAEROBIC BOTTLES CRITICAL VALUE NOTED.  VALUE IS CONSISTENT WITH PREVIOUSLY REPORTED AND CALLED VALUE. Performed at [facility], [place] [address], [place], [place] [address]    Culture GRANULICATELLA ADIACENS (A)  Final   Report Status PENDING  Incomplete  Blood Culture ID Panel (Reflexed)     Status: None   Collection Time: 01/17/21  8:46 PM  Result Value Ref Range Status   Enterococcus faecalis NOT DETECTED NOT DETECTED Final   Enterococcus Faecium NOT DETECTED NOT DETECTED Final   Listeria monocytogenes NOT DETECTED NOT DETECTED Final   Staphylococcus species NOT DETECTED NOT DETECTED Final   Staphylococcus aureus (BCID) NOT DETECTED NOT DETECTED Final   Staphylococcus epidermidis NOT DETECTED NOT DETECTED Final   Staphylococcus lugdunensis NOT DETECTED NOT DETECTED Final   Streptococcus species NOT DETECTED NOT DETECTED Final   Streptococcus agalactiae NOT DETECTED NOT DETECTED Final   Streptococcus pneumoniae NOT DETECTED NOT DETECTED Final   Streptococcus pyogenes NOT DETECTED NOT DETECTED Final   A.calcoaceticus-baumannii NOT DETECTED NOT DETECTED Final   Bacteroides fragilis NOT DETECTED NOT DETECTED Final   Enterobacterales NOT DETECTED NOT DETECTED Final   Enterobacter cloacae complex NOT DETECTED NOT DETECTED Final   Escherichia coli NOT DETECTED NOT DETECTED Final   Klebsiella aerogenes NOT DETECTED NOT DETECTED Final   Klebsiella oxytoca NOT DETECTED NOT DETECTED Final   Klebsiella pneumoniae NOT DETECTED NOT DETECTED Final   Proteus species NOT DETECTED NOT DETECTED Final   Salmonella species NOT DETECTED NOT DETECTED Final   Serratia marcescens NOT DETECTED NOT DETECTED Final   Haemophilus influenzae NOT DETECTED NOT DETECTED Final   Neisseria meningitidis  NOT DETECTED NOT DETECTED Final   Pseudomonas aeruginosa NOT DETECTED NOT DETECTED Final   Stenotrophomonas maltophilia NOT DETECTED NOT DETECTED Final   Candida  albicans NOT DETECTED NOT DETECTED Final   Candida auris NOT DETECTED NOT DETECTED Final   Candida glabrata NOT DETECTED NOT DETECTED Final   Candida krusei NOT DETECTED NOT DETECTED Final   Candida parapsilosis NOT DETECTED NOT DETECTED Final   Candida tropicalis NOT DETECTED NOT DETECTED Final   Cryptococcus neoformans/gattii NOT DETECTED NOT DETECTED Final    Comment: Performed at [facility], [place] [address] [place], [place] [address]   Micro: 7/23 bcx 4 of 4 bottles granulicatella adiacens 9/47 bcx in progress    Serology:    Imaging: If present, new imagings (plain films, ct scans, and mri) have been personally visualized and interpreted; radiology reports have been reviewed. Decision making incorporated into the Impression / Recommendations.  7/25 tte pending  7/24 ct chest abd pelv with angio 1. No evidence of pulmonary artery embolism. 2. Cardiomegaly and small pericardial effusion. 3. Diffuse body wall edema could reflect early volume overload/anasarca. 4. Distended gallbladder with layering hyperdensity, could reflect tiny biliary stones/sand or vicarious extravasation of contrast if patient has had a recent contrasted exam. While there is no pericholecystic fluid or inflammation, could correlate for right upper quadrant symptoms as isolated distension can be an early sign of developing cholecystitis. No biliary ductal dilatation or intraductal gallstones. 5. Small amount of fluid surrounding the anastomosis of a right femoral AV fistula in the right inguinal chain. Could reflect small chronic postoperative seroma though should correlate fistula function and local assessment of the inguinal region. 6. Small broad-based umbilical hernia into which protrudes portion of the anti mesenteric  transverse colon without evidence of vascular compromise or obstruction. 7. Diffuse osseous manifestations of renal osteodystrophy.  Jabier Mutton, [facility] [place] [phone] pager    01/19/2021, 3:33 PM

## 2021-01-19 NOTE — Care Management Obs Status (Signed)
[facility] NOTIFICATION   Patient Details  Name: KORINNE GREENSTEIN MRN: [id number] Date of Birth: 1950-11-01   Medicare Observation Status Notification Given:  Yes    Bartholomew Crews, RN 01/19/2021, 11:14 AM

## 2021-01-19 NOTE — Progress Notes (Addendum)
PROGRESS NOTE    Belinda [name]  RJJ:[id number] DOB: [birth year] DOA: 01/17/2021 PCP: Reynold Bowen, MD   Brief Narrative:   Belinda [name] is a 70 y.o. female with medical history significant of chronic systolic heart failure, ESRD anemia, ESRD on hemodialysis, anxiety/depression, chronic cervical and lumbar back pain, GERD, history of unspecified MRSA infection, hyperlipidemia, hypertension, migraine headaches, right hand nerve damage, palpitations, history of pneumonia twice in 2018 and 2019, pulmonary artery hypertension, seasonal allergies, insulin-dependent type II DM who is coming to the emergency department with hypoxia in the high 80s and fever after having dialysis on Saturday.    She states that she has been ill for about a week, had a sandwich from [place] [employer] [employer] on Sunday then subsequently started to have multiple episodes of vomiting.  Then on Wednesday, finished her leftover sandwich, then continued to have multiple episodes of diarrhea through the day.  Due to feeling ill, she missed her dialysis on Tuesday and Thursday.  She was able to complete her session on Saturday, however, she continued to have worsening generalized weakness, had pulse ox with oxygen saturation in the high 80s, requiring nasal cannula O2.  -She appeared to have some symptoms suggesting gastroenteritis, but is now noted to have gram-positive bacteremia with associated sepsis and is improving.  Nephrology plans for hemodialysis by 7/26. Discussed case with ID who recommends TEE which cannot be done until 7/28.  Assessment & Plan:   Principal Problem:   Sepsis due to undetermined organism [facility]) Active Problems:   Essential hypertension   Chronic systolic (congestive) heart failure (HCC)   DM (diabetes mellitus), type 2 with renal complications (HCC)   ESRD on hemodialysis (HCC)   Anemia due to end stage renal disease (HCC)   Abnormal liver function tests   Hypoxia   Sepsis present on admission  secondary to granulicatella adiacens -Related to strep variant -2D echocardiogram ordered with results pending -Patient will need TEE, and Cardiology states that earliest this could be done is 7/28 -ID to evaluate today -Noted to have some symptoms of gastroenteritis on admission -Continue vancomycin with no further need for cefepime or flagyl per ID -Leukocytosis improved, but continues to have elevated procalcitonin -Right femoral fistula site without signs of overt infection  Hypoxemia -This appears to be mild and possibly related to some atelectasis -Trial of incentive spirometry and weaning of supplemental oxygen  Mild transaminitis -Ordered ultrasound without signs of acute cholecystitis  Hyponatremia -Per nephrology, plans for hemodialysis by 7/26  Essential hypertension -Currently stable -Resumed on losartan and midodrine as she was taking at home  Chronic systolic heart failure -No signs of volume overload or decompensation at this time -Hemodialysis per nephrology to resume on 7/26  Type 2 diabetes -Carb modified diet -Hemoglobin A1c 8.0%  ESRD on HD TTS -Nephrology consulted for hemodialysis  Anemia due to end-stage renal disease -Continue to monitor repeat CBC with no overt bleeding identified -Erythropoietin per nephrology   DVT prophylaxis: Heparin Code Status: Full Family Communication: Discussed with husband on phone 7/25 Disposition Plan:  Status is: Observation  The patient will require care spanning > 2 midnights and should be moved to inpatient because: IV treatments appropriate due to intensity of illness or inability to take PO and Inpatient level of care appropriate due to severity of illness  Dispo: The patient is from: Home              Anticipated d/c is to: Home  Patient currently is not medically stable to d/c.   Difficult to place patient No  Consultants:  Nephrology ID Dr. Gale [facility]  Procedures:  See  below  Antimicrobials:  Anti-infectives (From admission, onward)    Start     Dose/Rate Route Frequency Ordered Stop   01/20/21 1200  vancomycin (VANCOREADY) IVPB 750 mg/150 mL        750 mg 150 mL/hr over 60 Minutes Intravenous Every T-Th-Sa (Hemodialysis) 01/17/21 2106     01/18/21 2100  ceFEPIme (MAXIPIME) 1 g in sodium chloride 0.9 % 100 mL IVPB        1 g 200 mL/hr over 30 Minutes Intravenous Every 24 hours 01/17/21 2106     01/18/21 0245  metroNIDAZOLE (FLAGYL) IVPB 500 mg       Note to Pharmacy: Hyperbilirubinemia with fever and suspicious finding on CT scan abdomen.   500 mg 100 mL/hr over 60 Minutes Intravenous Every 8 hours 01/18/21 0238     01/17/21 2015  ceFEPIme (MAXIPIME) 2 g in sodium chloride 0.9 % 100 mL IVPB        2 g 200 mL/hr over 30 Minutes Intravenous  Once 01/17/21 2005 01/17/21 2055   01/17/21 2015  vancomycin (VANCOREADY) IVPB 1500 mg/300 mL        1,500 mg 150 mL/hr over 120 Minutes Intravenous  Once 01/17/21 2005 01/17/21 2252      Subjective: Patient seen and evaluated today with no new acute complaints or concerns. No acute concerns or events noted overnight.  Objective: Vitals:   01/19/21 0336 01/19/21 0738 01/19/21 0854 01/19/21 1122  BP: 109/63 120/64  (!) 152/71  Pulse: 87 88  88  Resp: 17 17  19   Temp: 98.3 F (36.8 C) (!) 97.5 F (36.4 C)  (!) 97.4 F (36.3 C)  TempSrc: Oral Oral  Oral  SpO2: 97% 98% 98% 98%    Intake/Output Summary (Last 24 hours) at 01/19/2021 1502 Last data filed at 01/19/2021 0400 Gross per 24 hour  Intake 820 ml  Output --  Net 820 ml   There were no vitals filed for this visit.  Examination:  General exam: Appears calm and comfortable  Respiratory system: Clear to auscultation. Respiratory effort normal.  Currently on nasal cannula oxygen 2 L. Cardiovascular system: S1 & S2 heard, RRR.  Gastrointestinal system: Abdomen is soft Central nervous system: Alert and awake Extremities: No edema Skin: No  significant lesions noted Psychiatry: Flat affect.    Data Reviewed: I have personally reviewed following labs and imaging studies  CBC: Recent Labs  Lab 01/17/21 1805 01/18/21 0545 01/19/21 0046  WBC 9.4 8.0 8.7  NEUTROABS 7.1 5.2  --   HGB 11.0* 10.0* 8.9*  HCT 31.1* 29.2* 25.0*  MCV 82.7 84.6 81.4  PLT PLATELET CLUMPS NOTED ON SMEAR, UNABLE TO ESTIMATE 79* 88*   Basic Metabolic Panel: Recent Labs  Lab 01/17/21 1805 01/18/21 0545 01/19/21 0046  NA 137 133* 129*  K 4.1 3.9 3.8  CL 89* 89* 88*  CO2 30 28 26   GLUCOSE 159* 100* 346*  BUN 33* 38* 53*  CREATININE 6.43* 7.41* 8.83*  CALCIUM 8.9 8.7* 8.5*   GFR: CrCl cannot be calculated (Unknown ideal weight.). Liver Function Tests: Recent Labs  Lab 01/17/21 1805 01/18/21 0545  AST 41 31  ALT 67* 46*  ALKPHOS 242* 196*  BILITOT 2.1* 1.8*  PROT 7.1 5.6*  ALBUMIN 3.3* 2.6*   No results for input(s): LIPASE, AMYLASE in the last 168 hours.  No results for input(s): AMMONIA in the last 168 hours. Coagulation Profile: Recent Labs  Lab 01/17/21 1805  INR 1.0   Cardiac Enzymes: No results for input(s): CKTOTAL, CKMB, CKMBINDEX, TROPONINI in the last 168 hours. BNP (last 3 results) No results for input(s): PROBNP in the last 8760 hours. HbA1C: Recent Labs    01/18/21 0545  HGBA1C 8.0*   CBG: Recent Labs  Lab 01/18/21 1720 01/18/21 2105 01/18/21 2305 01/19/21 0744 01/19/21 1128  GLUCAP 293* 347* 351* 289* 198*   Lipid Profile: No results for input(s): CHOL, HDL, LDLCALC, TRIG, CHOLHDL, LDLDIRECT in the last 72 hours. Thyroid Function Tests: No results for input(s): TSH, T4TOTAL, FREET4, T3FREE, THYROIDAB in the last 72 hours. Anemia Panel: No results for input(s): VITAMINB12, FOLATE, FERRITIN, TIBC, IRON, RETICCTPCT in the last 72 hours. Sepsis Labs: Recent Labs  Lab 01/17/21 1805 01/17/21 2239 01/18/21 0545 01/19/21 0046  PROCALCITON  --   --  43.88 36.36  LATICACIDVEN 2.3* 1.3  --   --      Recent Results (from the past 240 hour(s))  Resp Panel by RT-PCR (Flu A&B, Covid) Nasopharyngeal Swab     Status: None   Collection Time: 01/17/21  5:59 PM   Specimen: Nasopharyngeal Swab; Nasopharyngeal(NP) swabs in vial transport medium  Result Value Ref Range Status   SARS Coronavirus 2 by RT PCR NEGATIVE NEGATIVE Final    Comment: (NOTE) SARS-CoV-2 target nucleic acids are NOT DETECTED.  The SARS-CoV-2 RNA is generally detectable in upper respiratory specimens during the acute phase of infection. The lowest concentration of SARS-CoV-2 viral copies this assay can detect is 138 copies/mL. A negative result does not preclude SARS-Cov-2 infection and should not be used as the sole basis for treatment or other patient management decisions. A negative result may occur with  improper specimen collection/handling, submission of specimen other than nasopharyngeal swab, presence of viral mutation(s) within the areas targeted by this assay, and inadequate number of viral copies(<138 copies/mL). A negative result must be combined with clinical observations, patient history, and epidemiological information. The expected result is Negative.  Fact Sheet for Patients:  EntrepreneurPulse.com.au  Fact Sheet for Healthcare Providers:  IncredibleEmployment.be  This test is no t yet approved or cleared by the [place] FDA and  has been authorized for detection and/or diagnosis of SARS-CoV-2 by FDA under an Emergency Use Authorization (EUA). This EUA will remain  in effect (meaning this test can be used) for the duration of the COVID-19 declaration under Section 564(b)(1) of the Act, 21 U.S.C.section 360bbb-3(b)(1), unless the authorization is terminated  or revoked sooner.       Influenza A by PCR NEGATIVE NEGATIVE Final   Influenza B by PCR NEGATIVE NEGATIVE Final    Comment: (NOTE) The Xpert Xpress SARS-CoV-2/FLU/RSV plus assay is intended as an  aid in the diagnosis of influenza from Nasopharyngeal swab specimens and should not be used as a sole basis for treatment. Nasal washings and aspirates are unacceptable for Xpert Xpress SARS-CoV-2/FLU/RSV testing.  Fact Sheet for Patients: EntrepreneurPulse.com.au  Fact Sheet for Healthcare Providers: IncredibleEmployment.be  This test is not yet approved or cleared by the [place] FDA and has been authorized for detection and/or diagnosis of SARS-CoV-2 by FDA under an Emergency Use Authorization (EUA). This EUA will remain in effect (meaning this test can be used) for the duration of the COVID-19 declaration under Section 564(b)(1) of the Act, 21 U.S.C. section 360bbb-3(b)(1), unless the authorization is terminated or revoked.  Performed at [facility]  [facility], [place] [address] [place], [place] [address]   Blood culture (routine x 2)     Status: Abnormal (Preliminary result)   Collection Time: 01/17/21  8:46 PM   Specimen: BLOOD LEFT HAND  Result Value Ref Range Status   Specimen Description BLOOD LEFT HAND  Final   Special Requests   Final    BOTTLES DRAWN AEROBIC AND ANAEROBIC Blood Culture results may not be optimal due to an inadequate volume of blood received in culture bottles   Culture  Setup Time   Final    GRAM POSITIVE COCCI IN PAIRS AND CHAINS IN BOTH AEROBIC AND ANAEROBIC BOTTLES CRITICAL RESULT CALLED TO, READ BACK BY AND VERIFIED WITH: PHARM D J.ORIET ON [id number] AT [id number] BY E.PARRISH    Culture (A)  Final    GRANULICATELLA ADIACENS SUSCEPTIBILITIES TO FOLLOW Performed at [facility], [place] [address], [place], [place] [address]    Report Status PENDING  Incomplete  Blood culture (routine x 2)     Status: Abnormal (Preliminary result)   Collection Time: 01/17/21  8:46 PM   Specimen: BLOOD  Result Value Ref Range Status   Specimen Description BLOOD LEFT ANTECUBITAL  Final   Special Requests   Final    BOTTLES DRAWN  AEROBIC AND ANAEROBIC Blood Culture results may not be optimal due to an inadequate volume of blood received in culture bottles   Culture  Setup Time   Final    GRAM POSITIVE COCCI IN PAIRS AND CHAINS IN BOTH AEROBIC AND ANAEROBIC BOTTLES CRITICAL VALUE NOTED.  VALUE IS CONSISTENT WITH PREVIOUSLY REPORTED AND CALLED VALUE. Performed at [facility], [place] [address], [place], [place] [address]    Culture GRANULICATELLA ADIACENS (A)  Final   Report Status PENDING  Incomplete  Blood Culture ID Panel (Reflexed)     Status: None   Collection Time: 01/17/21  8:46 PM  Result Value Ref Range Status   Enterococcus faecalis NOT DETECTED NOT DETECTED Final   Enterococcus Faecium NOT DETECTED NOT DETECTED Final   Listeria monocytogenes NOT DETECTED NOT DETECTED Final   Staphylococcus species NOT DETECTED NOT DETECTED Final   Staphylococcus aureus (BCID) NOT DETECTED NOT DETECTED Final   Staphylococcus epidermidis NOT DETECTED NOT DETECTED Final   Staphylococcus lugdunensis NOT DETECTED NOT DETECTED Final   Streptococcus species NOT DETECTED NOT DETECTED Final   Streptococcus agalactiae NOT DETECTED NOT DETECTED Final   Streptococcus pneumoniae NOT DETECTED NOT DETECTED Final   Streptococcus pyogenes NOT DETECTED NOT DETECTED Final   A.calcoaceticus-baumannii NOT DETECTED NOT DETECTED Final   Bacteroides fragilis NOT DETECTED NOT DETECTED Final   Enterobacterales NOT DETECTED NOT DETECTED Final   Enterobacter cloacae complex NOT DETECTED NOT DETECTED Final   Escherichia coli NOT DETECTED NOT DETECTED Final   Klebsiella aerogenes NOT DETECTED NOT DETECTED Final   Klebsiella oxytoca NOT DETECTED NOT DETECTED Final   Klebsiella pneumoniae NOT DETECTED NOT DETECTED Final   Proteus species NOT DETECTED NOT DETECTED Final   Salmonella species NOT DETECTED NOT DETECTED Final   Serratia marcescens NOT DETECTED NOT DETECTED Final   Haemophilus influenzae NOT DETECTED NOT DETECTED Final   Neisseria  meningitidis NOT DETECTED NOT DETECTED Final   Pseudomonas aeruginosa NOT DETECTED NOT DETECTED Final   Stenotrophomonas maltophilia NOT DETECTED NOT DETECTED Final   Candida albicans NOT DETECTED NOT DETECTED Final   Candida auris NOT DETECTED NOT DETECTED Final   Candida glabrata NOT DETECTED NOT DETECTED Final   Candida  krusei NOT DETECTED NOT DETECTED Final   Candida parapsilosis NOT DETECTED NOT DETECTED Final   Candida tropicalis NOT DETECTED NOT DETECTED Final   Cryptococcus neoformans/gattii NOT DETECTED NOT DETECTED Final    Comment: Performed at [facility], [address] [place], [place] [address]         Radiology Studies: DG Chest 1 View  Result Date: 01/17/2021 CLINICAL DATA:  Weakness. EXAM: CHEST  1 VIEW COMPARISON:  March 19, 2019. FINDINGS: Stable cardiomediastinal silhouette. No pneumothorax is noted. Hypoinflation of the lungs is noted with minimal bibasilar subsegmental atelectasis. Bony thorax is unremarkable. IMPRESSION: Hypoinflation of the lungs is noted with minimal bibasilar subsegmental atelectasis. Electronically Signed   By: Marijo Conception M.D.   On: 01/17/2021 18:29   CT Angio Chest PE W and/or Wo Contrast  Addendum Date: 01/18/2021   ADDENDUM REPORT: 01/18/2021 00:36 ADDENDUM: 8 mm ground-glass opacity seen in the left lung apex. Possibly an acute infectious or inflammatory process though neoplasm is not fully excluded. Initial follow-up with CT at 6-12 months is recommended to confirm persistence. If persistent, repeat CT is recommended every 2 years until 5 years of stability has been established. This recommendation follows the consensus statement: Guidelines for Management of Incidental Pulmonary Nodules Detected on CT Images: From the Fleischner Society 2017; Radiology 2017; 284:228-243. Electronically Signed   By: Lovena Le M.D.   On: 01/18/2021 00:36   Result Date: 01/18/2021 CLINICAL DATA:  Chest discomfort, nausea and vomiting EXAM:  CT ANGIOGRAPHY CHEST CT ABDOMEN AND PELVIS WITH CONTRAST TECHNIQUE: Multidetector CT imaging of the chest was performed using the standard protocol during bolus administration of intravenous contrast. Multiplanar CT image reconstructions and MIPs were obtained to evaluate the vascular anatomy. Multidetector CT imaging of the abdomen and pelvis was performed using the standard protocol during bolus administration of intravenous contrast. CONTRAST:  34mL OMNIPAQUE IOHEXOL 350 MG/ML SOLN COMPARISON:  Radiograph 01/17/2021 some ultrasound abdomen 04/21/2018 FINDINGS: CTA CHEST FINDINGS Cardiovascular: Satisfactory opacification the pulmonary arteries to the segmental level. No pulmonary artery filling defects are identified. Central pulmonary arteries are normal caliber. Cardiomegaly. Small pericardial effusion. Suboptimal opacification of the thoracic aorta for luminal assessment. Atherosclerotic plaque within the normal caliber aorta. No gross acute aortic abnormality is evident. Normal 3 vessel branching of the aortic arch. Proximal great vessels are unremarkable. Mediastinum/Nodes: No mediastinal fluid or gas. Normal thyroid gland and thoracic inlet. No acute abnormality of the trachea or esophagus. No worrisome mediastinal, hilar or axillary adenopathy. Lungs/Pleura: Low lung volumes and mixed dependent and subsegmental atelectatic changes. 8 mm ground-glass opacity seen in left upper lobe (4/25). No other consolidative opacities in the lungs. No pneumothorax. No layering effusion. No other concerning nodules or masses. Musculoskeletal: No acute osseous abnormality or suspicious osseous lesion. Exaggerated thoracic kyphosis with multilevel bony fusion across the vertebral bodies and posterior elements noted T9-T12 as well as more pronounced spondylitic changes at the adjacent T7-T8, T8-T9 levels. Prior cervical fusion noted as well. No acute or worrisome osseous abnormality. Minimal degenerative changes of the  shoulders. No worrisome chest wall mass or lesion. Diffuse body wall edema. Review of the MIP images confirms the above findings. CT ABDOMEN and PELVIS FINDINGS Hepatobiliary: No worrisome focal liver lesions. Smooth liver surface contour. Normal hepatic attenuation. Gallbladder is mildly distended with some layering hyperdense material, could reflect biliary sand or if patient has received additional contrast media in recent days, possible vicarious extravasation of contrast media. No visible intraductal gallstones or significant  biliary ductal dilatation. Pancreas: Normal tapering of the distal common bile duct. No pancreatic inflammation or discernible pancreatic lesion. Spleen: Normal in size. No concerning splenic lesions. Small accessory splenule posteriorly. Adrenals/Urinary Tract: Normal adrenals. Kidneys are normally located with symmetric enhancement and excretion. Fluid attenuation cyst seen in the upper pole right kidney measuring up to 2.2 cm in size. No suspicious renal lesion, urolithiasis or hydronephrosis. Urinary bladder is largely decompressed at the time of exam and therefore poorly evaluated by CT imaging. No gross bladder abnormality accounting for underdistention. Stomach/Bowel: Distal esophagus unremarkable. Stomach and duodenum are free of acute abnormality with a normal duodenal sweep across the midline abdomen. No small bowel thickening or dilatation. No evidence of high-grade small bowel obstruction. No clear colonic thickening or dilatation accounting for varying degrees of underdistention. Appendix is not visualized. No focal inflammation the vicinity of the cecum to suggest an occult appendicitis. Vascular/Lymphatic: Atherosclerotic calcifications within the abdominal aorta and branch vessels. No aneurysm or ectasia. Small amount of fluid seen surrounding the origin of a right common femoral arteriovenous fistula graft (6/68). No pathologically enlarged abdominopelvic lymph nodes.  Reproductive: Retroverted uterus with calcified uterine fibroids. No concerning adnexal mass. Other: Diffuse body wall edema. Mild edematous changes in the mesentery. No abdominopelvic free air or fluid. No bowel containing hernia. Postsurgical changes of the anterior abdominal wall including a small umbilical hernia into which protrudes portion of the anti mesenteric transverse colon (6/39) albeit without evidence of vascular compromise or obstruction at this level. Musculoskeletal: Diffuse osseous manifestations of renal osteodystrophy. Grade 2 anterolisthesis L4 on L5 without spondylolysis. Multilevel discogenic and facet degenerative changes are maximal at the L4-5 level as well. Additional degenerative changes in the bilateral hips and additional arthrosis at the SI joints and symphysis pubis. Review of the MIP images confirms the above findings. IMPRESSION: 1. No evidence of pulmonary artery embolism. 2. Cardiomegaly and small pericardial effusion. 3. Diffuse body wall edema could reflect early volume overload/anasarca. 4. Distended gallbladder with layering hyperdensity, could reflect tiny biliary stones/sand or vicarious extravasation of contrast if patient has had a recent contrasted exam. While there is no pericholecystic fluid or inflammation, could correlate for right upper quadrant symptoms as isolated distension can be an early sign of developing cholecystitis. No biliary ductal dilatation or intraductal gallstones. 5. Small amount of fluid surrounding the anastomosis of a right femoral AV fistula in the right inguinal chain. Could reflect small chronic postoperative seroma though should correlate fistula function and local assessment of the inguinal region. 6. Small broad-based umbilical hernia into which protrudes portion of the anti mesenteric transverse colon without evidence of vascular compromise or obstruction. 7. Diffuse osseous manifestations of renal osteodystrophy. Electronically Signed: By:  Lovena Le M.D. On: 01/18/2021 00:18   CT ABDOMEN PELVIS W CONTRAST  Addendum Date: 01/18/2021   ADDENDUM REPORT: 01/18/2021 00:36 ADDENDUM: 8 mm ground-glass opacity seen in the left lung apex. Possibly an acute infectious or inflammatory process though neoplasm is not fully excluded. Initial follow-up with CT at 6-12 months is recommended to confirm persistence. If persistent, repeat CT is recommended every 2 years until 5 years of stability has been established. This recommendation follows the consensus statement: Guidelines for Management of Incidental Pulmonary Nodules Detected on CT Images: From the Fleischner Society 2017; Radiology 2017; 284:228-243. Electronically Signed   By: Lovena Le M.D.   On: 01/18/2021 00:36   Result Date: 01/18/2021 CLINICAL DATA:  Chest discomfort, nausea and vomiting EXAM: CT ANGIOGRAPHY CHEST CT ABDOMEN  AND PELVIS WITH CONTRAST TECHNIQUE: Multidetector CT imaging of the chest was performed using the standard protocol during bolus administration of intravenous contrast. Multiplanar CT image reconstructions and MIPs were obtained to evaluate the vascular anatomy. Multidetector CT imaging of the abdomen and pelvis was performed using the standard protocol during bolus administration of intravenous contrast. CONTRAST:  85mL OMNIPAQUE IOHEXOL 350 MG/ML SOLN COMPARISON:  Radiograph 01/17/2021 some ultrasound abdomen 04/21/2018 FINDINGS: CTA CHEST FINDINGS Cardiovascular: Satisfactory opacification the pulmonary arteries to the segmental level. No pulmonary artery filling defects are identified. Central pulmonary arteries are normal caliber. Cardiomegaly. Small pericardial effusion. Suboptimal opacification of the thoracic aorta for luminal assessment. Atherosclerotic plaque within the normal caliber aorta. No gross acute aortic abnormality is evident. Normal 3 vessel branching of the aortic arch. Proximal great vessels are unremarkable. Mediastinum/Nodes: No mediastinal fluid  or gas. Normal thyroid gland and thoracic inlet. No acute abnormality of the trachea or esophagus. No worrisome mediastinal, hilar or axillary adenopathy. Lungs/Pleura: Low lung volumes and mixed dependent and subsegmental atelectatic changes. 8 mm ground-glass opacity seen in left upper lobe (4/25). No other consolidative opacities in the lungs. No pneumothorax. No layering effusion. No other concerning nodules or masses. Musculoskeletal: No acute osseous abnormality or suspicious osseous lesion. Exaggerated thoracic kyphosis with multilevel bony fusion across the vertebral bodies and posterior elements noted T9-T12 as well as more pronounced spondylitic changes at the adjacent T7-T8, T8-T9 levels. Prior cervical fusion noted as well. No acute or worrisome osseous abnormality. Minimal degenerative changes of the shoulders. No worrisome chest wall mass or lesion. Diffuse body wall edema. Review of the MIP images confirms the above findings. CT ABDOMEN and PELVIS FINDINGS Hepatobiliary: No worrisome focal liver lesions. Smooth liver surface contour. Normal hepatic attenuation. Gallbladder is mildly distended with some layering hyperdense material, could reflect biliary sand or if patient has received additional contrast media in recent days, possible vicarious extravasation of contrast media. No visible intraductal gallstones or significant biliary ductal dilatation. Pancreas: Normal tapering of the distal common bile duct. No pancreatic inflammation or discernible pancreatic lesion. Spleen: Normal in size. No concerning splenic lesions. Small accessory splenule posteriorly. Adrenals/Urinary Tract: Normal adrenals. Kidneys are normally located with symmetric enhancement and excretion. Fluid attenuation cyst seen in the upper pole right kidney measuring up to 2.2 cm in size. No suspicious renal lesion, urolithiasis or hydronephrosis. Urinary bladder is largely decompressed at the time of exam and therefore poorly  evaluated by CT imaging. No gross bladder abnormality accounting for underdistention. Stomach/Bowel: Distal esophagus unremarkable. Stomach and duodenum are free of acute abnormality with a normal duodenal sweep across the midline abdomen. No small bowel thickening or dilatation. No evidence of high-grade small bowel obstruction. No clear colonic thickening or dilatation accounting for varying degrees of underdistention. Appendix is not visualized. No focal inflammation the vicinity of the cecum to suggest an occult appendicitis. Vascular/Lymphatic: Atherosclerotic calcifications within the abdominal aorta and branch vessels. No aneurysm or ectasia. Small amount of fluid seen surrounding the origin of a right common femoral arteriovenous fistula graft (6/68). No pathologically enlarged abdominopelvic lymph nodes. Reproductive: Retroverted uterus with calcified uterine fibroids. No concerning adnexal mass. Other: Diffuse body wall edema. Mild edematous changes in the mesentery. No abdominopelvic free air or fluid. No bowel containing hernia. Postsurgical changes of the anterior abdominal wall including a small umbilical hernia into which protrudes portion of the anti mesenteric transverse colon (6/39) albeit without evidence of vascular compromise or obstruction at this level. Musculoskeletal: Diffuse osseous manifestations of  renal osteodystrophy. Grade 2 anterolisthesis L4 on L5 without spondylolysis. Multilevel discogenic and facet degenerative changes are maximal at the L4-5 level as well. Additional degenerative changes in the bilateral hips and additional arthrosis at the SI joints and symphysis pubis. Review of the MIP images confirms the above findings. IMPRESSION: 1. No evidence of pulmonary artery embolism. 2. Cardiomegaly and small pericardial effusion. 3. Diffuse body wall edema could reflect early volume overload/anasarca. 4. Distended gallbladder with layering hyperdensity, could reflect tiny biliary  stones/sand or vicarious extravasation of contrast if patient has had a recent contrasted exam. While there is no pericholecystic fluid or inflammation, could correlate for right upper quadrant symptoms as isolated distension can be an early sign of developing cholecystitis. No biliary ductal dilatation or intraductal gallstones. 5. Small amount of fluid surrounding the anastomosis of a right femoral AV fistula in the right inguinal chain. Could reflect small chronic postoperative seroma though should correlate fistula function and local assessment of the inguinal region. 6. Small broad-based umbilical hernia into which protrudes portion of the anti mesenteric transverse colon without evidence of vascular compromise or obstruction. 7. Diffuse osseous manifestations of renal osteodystrophy. Electronically Signed: By: Lovena Le M.D. On: 01/18/2021 00:18   US Abdomen Limited RUQ (LIVER/GB)  Result Date: 01/18/2021 CLINICAL DATA:  Hyperbilirubinemia with gallbladder debris seen on recent CT. EXAM: ULTRASOUND ABDOMEN LIMITED RIGHT UPPER QUADRANT COMPARISON:  April 21, 2018 FINDINGS: Gallbladder: A layer of shadowing, markedly echogenic material is seen within the dependent portion of a markedly distended gallbladder lumen (11.2 cm in length). This corresponds to the findings seen on the prior abdomen pelvis CT. No gallbladder thickening is visualized (2.3 mm). No sonographic Murphy sign noted by sonographer. Common bile duct: Diameter: 3.7 mm Liver: No focal lesion identified. The liver parenchyma is mildly nodular in contour and within normal limits in parenchymal echogenicity. Portal vein is patent on color Doppler imaging with normal direction of blood flow towards the liver. Other: The study is limited secondary to overlying bowel gas and rapid patient breathing. IMPRESSION: 1. Echogenic material within the gallbladder lumen which may represent milk of calcium versus retained contrast from a recent study. 2.  No acute cholecystitis. Electronically Signed   By: Virgina Norfolk M.D.   On: 01/18/2021 03:48        Scheduled Meds:  aspirin EC  81 mg Oral Once per day on Mon Wed Fri   Chlorhexidine Gluconate Cloth  6 each Topical Daily   simvastatin  40 mg Oral QHS   And   ezetimibe  10 mg Oral QHS   heparin injection (subcutaneous)  5,000 Units Subcutaneous Q8H   insulin aspart  0-9 Units Subcutaneous TID WC   insulin glargine  10 Units Subcutaneous Daily   losartan  12.5 mg Oral Daily   midodrine  10 mg Oral Once per day on Mon Wed Fri   mometasone-formoterol  2 puff Inhalation BID   Continuous Infusions:  ceFEPime (MAXIPIME) IV Stopped (01/18/21 2136)   metronidazole 500 mg (01/19/21 0504)   [START ON 01/20/2021] vancomycin       LOS: 0 days    Time spent: 35 minutes    Carmen Tolliver Darleen Crocker, DO [facility]  If 7PM-7AM, please contact night-coverage [email or web address] 01/19/2021, 3:02 PM

## 2021-01-19 NOTE — Consult Note (Signed)
[facility] Renal Consultation Note    Indication for Consultation:  Management of ESRD/hemodialysis; anemia, hypertension/volume and secondary hyperparathyroidism PCP:  HPI: Belinda [name] is a 70 y.o. female with ESRD on HD, T2DM, HTN, Hx goiter, HFrEF (30-35% with pulm HTN). She was admitted for sepsis w/u. She had been ill for a week with nausea/vomiting and thought she had food poisoning. Progressive weakness after dialysis Saturday and presented to the ED. Sepsis criteria met on arrival. Empiric antibiotics started and blood cultures collected - returned positive with Granulicatella adiacens. Infectious disease has been consulted.   Nephrology consult for dialysis needs. Dialysis at [facility]. Last dialysis Saturday via left thigh graft.  Today she feels much better and has no specific complaints.   Past Medical History:  Diagnosis Date   Acute CHF (congestive heart failure) ([place]) 02/02/2018   Anemia    "get monthly injections to boost my HgB" (02/02/2018)   Anxiety    Asthma    Back pain    intractable low back   Chronic neck pain    CKD (chronic kidney disease), stage III (HCC)    on dialysis T/Th/Sa Jeneen Rinks   Depression    Dysrhythmia    palpitations or heart racing periodic. has had for years- Dr Mare Ferrari follows.   GERD (gastroesophageal reflux disease)    History of blood transfusion 2016   After knee replacemnet   Hx MRSA infection    Hyperlipidemia    Hypertension    Migraines    "~ 2/month" (02/02/2018), gets headaches with dialysis   Nerve damage    right hand   Osteoarthritis    "knees, neck" (02/02/2018)   Palpitations    Pneumonia    "twice in 2018" (02/02/2018)   Pulmonary artery hypertension (HCC)    Seasonal allergies    Type II diabetes mellitus (HCC)    insulin dependent - fasting 140-200    Past Surgical History:  Procedure Laterality Date   ABDOMINAL HYSTERECTOMY  03/2001   Abdominal supracervical  hysterectomy, left salpingo-oophorectomy   ANTERIOR CERVICAL DECOMP/DISCECTOMY FUSION N/A 01/01/2014   Procedure: CERVICAL THREE-FOUR,CERVICAL FIVE-SIX,CERVICAL SIX-SEVEN ANTERIOR CERVICAL DECOMPRESSION/DISCECTOMY/FUSION;  Surgeon: Kristeen Miss, MD;  Location: [facility];  Service: Neurosurgery;  Laterality: N/A;  left-side approach   AV FISTULA PLACEMENT Right 02/13/2020   Procedure: ARTERIOVENOUS (AV) FISTULA CREATION;  Surgeon: Waynetta Sandy, MD;  Location: [place];  Service: Vascular;  Laterality: Right;   AV FISTULA PLACEMENT Right 08/11/2020   Procedure: INSERTION OF RIGHT THIGH ARTERIOVENOUS (AV) GORE-TEX GRAFT;  Surgeon: Angelia Mould, MD;  Location: [place];  Service: Vascular;  Laterality: Right;   [place] Right 04/16/2020   Procedure: BASCILIC VEIN TRANSPOSITION SECOND STAGE RIGHT;  Surgeon: Waynetta Sandy, MD;  Location: [place];  Service: Vascular;  Laterality: Right;   CATARACT EXTRACTION W/ INTRAOCULAR LENS  IMPLANT, BILATERAL Bilateral 2012   CESAREAN SECTION     COLONOSCOPY W/ POLYPECTOMY     DILATION AND CURETTAGE OF UTERUS  X 2   ESOPHAGOGASTRODUODENOSCOPY     HAND SURGERY Left 1970s   laceration repair 4th and 5th digits   KNEE ARTHROSCOPY Left    torn mensicus   LIGATION OF COMPETING BRANCHES OF ARTERIOVENOUS FISTULA Right 05/07/2020   Procedure: LIGATION OF RIGHT UPPER EXTREMITY FISTULA;  Surgeon: Waynetta Sandy, MD;  Location: [place];  Service: Vascular;  Laterality: Right;   NASAL SINUS SURGERY  RIGHT HEART CATH N/A 06/23/2018   Procedure: RIGHT HEART CATH;  Surgeon: Jolaine Artist, MD;  Location: [facility];  Service: Cardiovascular;  Laterality: N/A;   TOTAL KNEE ARTHROPLASTY Left 02/12/2015   Procedure: TOTAL KNEE ARTHROPLASTY;  Surgeon: [name], MD;  Location: [place];  Service: Orthopedics;  Laterality: Left;   Family History  Problem Relation Age of Onset   Heart  disease Mother    Heart failure Father    Colon cancer Neg Hx    Stomach cancer Neg Hx    Esophageal cancer Neg Hx    Social History:  reports that she has never smoked. She has never used smokeless tobacco. She reports that she does not drink alcohol and does not use drugs. Allergies  Allergen Reactions   Doxycycline Other (See Comments)   Entresto [Sacubitril-Valsartan] Itching   Metformin Other (See Comments)    weakness   Omnicef [Cefdinir] Other (See Comments)    GI Upset   Aspirin Other (See Comments)    GI issues, Can tolerate low aspirin   Augmentin [Amoxicillin-Pot Clavulanate] Nausea And Vomiting and Other (See Comments)    GI issues DID THE REACTION INVOLVE: Swelling of the face/tongue/throat, SOB, or low BP? No Sudden or severe rash/hives, skin peeling, or the inside of the mouth or nose? No Did it require medical treatment? No When did it last happen?      long time  If all above answers are "NO", may proceed with cephalosporin use.    Erythromycin Nausea And Vomiting and Other (See Comments)    Gi issues   Gabapentin Swelling   Nickel Itching and Rash    Breakouts    Prior to Admission medications   Medication Sig Start Date End Date Taking? Authorizing Provider  acetaminophen (TYLENOL) 650 MG CR tablet Take 650-1,300 mg by mouth every 8 (eight) hours as needed for pain (pain).    Yes [provider]  Alpha-D-Galactosidase (BEANO PO) Take 1-2 tablets by mouth daily as needed (gas). With certain foods.   Yes [provider]  ALPRAZolam (XANAX) 0.5 MG tablet Take 0.25-0.5 mg by mouth 2 (two) times daily as needed for anxiety. 12/13/20  Yes [provider]  aspirin EC 81 MG tablet Take 81 mg by mouth 3 (three) times a week. Swallow whole.   Yes [provider]  Azelastine HCl 0.15 % SOLN Place 2 sprays into both nostrils daily.   Yes [provider]  budesonide-formoterol (SYMBICORT) 160-4.5 MCG/ACT inhaler Inhale 2 puffs  into the lungs 2 (two) times daily as needed (cough/respiratory issues.).    Yes [provider]  diclofenac sodium (VOLTAREN) 1 % GEL Apply 1 application topically 4 (four) times daily as needed (neck pain).    Yes [provider]  ezetimibe-simvastatin (VYTORIN) 10-40 MG per tablet Take 1 tablet by mouth at bedtime.    Yes [provider]  ferric citrate (AURYXIA) 1 GM 210 MG(Fe) tablet Take 210-420 mg by mouth See admin instructions. Take 2 tablets (420 mg) by mouth 3 times daily with meals & take 1 tablet (210 mg) by mouth with each snack 11/01/19  Yes [provider]  fexofenadine (ALLEGRA) 180 MG tablet Take 180 mg by mouth daily.   Yes [provider]  guaiFENesin (MUCINEX) 600 MG 12 hr tablet Take 600 mg by mouth in the morning and at bedtime.    Yes [provider]  insulin lispro (HUMALOG) 100 UNIT/ML injection Inject 4-10 Units  into the skin 3 (three) times daily before meals. Sliding Scale   Yes [provider]  IRON SUCROSE IV Inject 1 Dose into the vein as needed.   Yes [provider]  ketotifen (ZADITOR) 0.025 % ophthalmic solution Place 1 drop into both eyes 2 (two) times daily as needed (allergy/dry/irritated eyes.).   Yes [provider]  LANTUS SOLOSTAR 100 UNIT/ML Solostar Pen Inject 18 Units into the skin at bedtime.  04/19/18  Yes [provider]  losartan (COZAAR) 25 MG tablet Take 12.5 mg by mouth daily. 07/11/20  Yes [provider]  Magnesium 200 MG TABS Take 0.5 tablets (100 mg total) by mouth 2 (two) times daily.   Yes [provider]  Methoxy PEG-Epoetin Beta (MIRCERA IJ) Mircera 02/14/20 02/12/21 Yes [provider]  midodrine (PROAMATINE) 10 MG tablet Take 10 mg by mouth 3 (three) times a week. Pre dialysis   Yes [provider]  multivitamin (RENA-VIT) TABS tablet Take 1 tablet by mouth daily with breakfast.  11/01/19  Yes [provider]   nitroGLYCERIN (NITROSTAT) 0.4 MG SL tablet Place 1 tablet (0.4 mg total) under the tongue every 5 (five) minutes x 3 doses as needed for chest pain. 03/14/20  Yes Bensimhon, Bevelyn Buckles, MD  simethicone (MYLICON) 80 MG chewable tablet Chew 160 mg by mouth daily as needed for flatulence (indigestion.).    Yes [provider]  VITAMIN D, CHOLECALCIFEROL, PO Take by mouth 3 (three) times a week. With dialysis   Yes [provider]   Current Facility-Administered Medications  Medication Dose Route Frequency Provider Last Rate Last Admin   acetaminophen (TYLENOL) tablet 650 mg  650 mg Oral Q6H PRN Bobette Mo, MD   650 mg at 01/19/21 1337   Or   acetaminophen (TYLENOL) suppository 650 mg  650 mg Rectal Q6H PRN Bobette Mo, MD       ALPRAZolam Prudy Feeler) tablet 0.25-0.5 mg  0.25-0.5 mg Oral BID PRN Noralee Stain, DO   0.25 mg at 01/18/21 2107   aspirin EC tablet 81 mg  81 mg Oral Once per day on Mon Wed Fri Noralee Stain, DO   81 mg at 01/19/21 4419   Chlorhexidine Gluconate Cloth 2 % PADS 6 each  6 each Topical Daily Maurilio Lovely D, DO   6 each at 01/19/21 1141   simvastatin (ZOCOR) tablet 40 mg  40 mg Oral QHS Noralee Stain, DO   40 mg at 01/18/21 2224   And   ezetimibe (ZETIA) tablet 10 mg  10 mg Oral QHS Noralee Stain, DO   10 mg at 01/18/21 2224   guaiFENesin (MUCINEX) 12 hr tablet 600 mg  600 mg Oral BID PRN John Giovanni, MD   600 mg at 01/19/21 1202   heparin injection 5,000 Units  5,000 Units Subcutaneous Q8H Bobette Mo, MD   5,000 Units at 01/19/21 1337   insulin aspart (novoLOG) injection 0-9 Units  0-9 Units Subcutaneous TID WC Bobette Mo, MD   2 Units at 01/19/21 1158   insulin glargine (LANTUS) injection 10 Units  10 Units Subcutaneous Daily Maurilio Lovely D, DO   10 Units at 01/19/21 1159   losartan (COZAAR) tablet 12.5 mg  12.5 mg Oral Daily Noralee Stain, DO   12.5 mg at 01/19/21 0826   midodrine (PROAMATINE) tablet 10 mg  10 mg  Oral Once per day on Mon Wed Fri Noralee Stain, DO   10 mg at 01/19/21 [phone]  mometasone-formoterol (DULERA) 200-5 MCG/ACT inhaler 2 puff  2 puff Inhalation BID Dessa Phi, DO   2 puff at 01/19/21 0854   ondansetron (ZOFRAN) tablet 4 mg  4 mg Oral Q6H PRN Reubin Milan, MD       Or   ondansetron [employer]) injection 4 mg  4 mg Intravenous Q6H PRN Reubin Milan, MD       [START ON 01/20/2021] vancomycin (VANCOREADY) IVPB 750 mg/150 mL  750 mg Intravenous Q T,Th,Sa-HD Reubin Milan, MD         ROS: As per HPI otherwise negative.  Physical Exam: Vitals:   01/19/21 0738 01/19/21 0854 01/19/21 1122 01/19/21 1542  BP: 120/64  (!) 152/71 (!) 128/57  Pulse: 88  88 91  Resp: $Remo'17  19 20  'BlPcZ$ Temp: (!) 97.5 F (36.4 C)  (!) 97.4 F (36.3 C) 98 F (36.7 C)  TempSrc: Oral  Oral Oral  SpO2: 98% 98% 98%      General: Well appearing, nad, on nasal oxygen Head: NCAT sclera not icteric MMM Neck: Supple. No JVD No masses Lungs: CTA bilaterally without wheezes, rales, or rhonchi. Breathing is unlabored. Heart: RRR with S1 S2 Abdomen: soft non-tender  Lower extremities:without edema or ischemic changes, no open wounds  Neuro: A & O  X 3. Moves all extremities spontaneously. Psych:  Responds to questions appropriately with a normal affect. Dialysis Access: R thigh AVG +bruit. No erythema, tenderness.   Labs: Basic Metabolic Panel: Recent Labs  Lab 01/17/21 1805 01/18/21 0545 01/19/21 0046  NA 137 133* 129*  K 4.1 3.9 3.8  CL 89* 89* 88*  CO2 $Re'30 28 26  'Vme$ GLUCOSE 159* 100* 346*  BUN 33* 38* 53*  CREATININE 6.43* 7.41* 8.83*  CALCIUM 8.9 8.7* 8.5*   Liver Function Tests: Recent Labs  Lab 01/17/21 1805 01/18/21 0545  AST 41 31  ALT 67* 46*  ALKPHOS 242* 196*  BILITOT 2.1* 1.8*  PROT 7.1 5.6*  ALBUMIN 3.3* 2.6*   No results for input(s): LIPASE, AMYLASE in the last 168 hours. No results for input(s): AMMONIA in the last 168 hours. CBC: Recent Labs  Lab  01/17/21 1805 01/18/21 0545 01/19/21 0046  WBC 9.4 8.0 8.7  NEUTROABS 7.1 5.2  --   HGB 11.0* 10.0* 8.9*  HCT 31.1* 29.2* 25.0*  MCV 82.7 84.6 81.4  PLT PLATELET CLUMPS NOTED ON SMEAR, UNABLE TO ESTIMATE 79* 88*   Cardiac Enzymes: No results for input(s): CKTOTAL, CKMB, CKMBINDEX, TROPONINI in the last 168 hours. CBG: Recent Labs  Lab 01/18/21 2105 01/18/21 2305 01/19/21 0744 01/19/21 1128 01/19/21 1541  GLUCAP 347* 351* 289* 198* 226*   Iron Studies: No results for input(s): IRON, TIBC, TRANSFERRIN, FERRITIN in the last 72 hours. Studies/Results: DG Chest 1 View  Result Date: 01/17/2021 CLINICAL DATA:  Weakness. EXAM: CHEST  1 VIEW COMPARISON:  March 19, 2019. FINDINGS: Stable cardiomediastinal silhouette. No pneumothorax is noted. Hypoinflation of the lungs is noted with minimal bibasilar subsegmental atelectasis. Bony thorax is unremarkable. IMPRESSION: Hypoinflation of the lungs is noted with minimal bibasilar subsegmental atelectasis. Electronically Signed   By: Marijo Conception M.D.   On: 01/17/2021 18:29   CT Angio Chest PE W and/or Wo Contrast  Addendum Date: 01/18/2021   ADDENDUM REPORT: 01/18/2021 00:36 ADDENDUM: 8 mm ground-glass opacity seen in the left lung apex. Possibly an acute infectious or inflammatory process though neoplasm is not fully excluded. Initial follow-up with CT at 6-12 months is recommended to confirm persistence.  If persistent, repeat CT is recommended every 2 years until 5 years of stability has been established. This recommendation follows the consensus statement: Guidelines for Management of Incidental Pulmonary Nodules Detected on CT Images: From the Fleischner Society 2017; Radiology 2017; 284:228-243. Electronically Signed   By: Lovena Le M.D.   On: 01/18/2021 00:36   Result Date: 01/18/2021 CLINICAL DATA:  Chest discomfort, nausea and vomiting EXAM: CT ANGIOGRAPHY CHEST CT ABDOMEN AND PELVIS WITH CONTRAST TECHNIQUE: Multidetector CT  imaging of the chest was performed using the standard protocol during bolus administration of intravenous contrast. Multiplanar CT image reconstructions and MIPs were obtained to evaluate the vascular anatomy. Multidetector CT imaging of the abdomen and pelvis was performed using the standard protocol during bolus administration of intravenous contrast. CONTRAST:  44mL OMNIPAQUE IOHEXOL 350 MG/ML SOLN COMPARISON:  Radiograph 01/17/2021 some ultrasound abdomen 04/21/2018 FINDINGS: CTA CHEST FINDINGS Cardiovascular: Satisfactory opacification the pulmonary arteries to the segmental level. No pulmonary artery filling defects are identified. Central pulmonary arteries are normal caliber. Cardiomegaly. Small pericardial effusion. Suboptimal opacification of the thoracic aorta for luminal assessment. Atherosclerotic plaque within the normal caliber aorta. No gross acute aortic abnormality is evident. Normal 3 vessel branching of the aortic arch. Proximal great vessels are unremarkable. Mediastinum/Nodes: No mediastinal fluid or gas. Normal thyroid gland and thoracic inlet. No acute abnormality of the trachea or esophagus. No worrisome mediastinal, hilar or axillary adenopathy. Lungs/Pleura: Low lung volumes and mixed dependent and subsegmental atelectatic changes. 8 mm ground-glass opacity seen in left upper lobe (4/25). No other consolidative opacities in the lungs. No pneumothorax. No layering effusion. No other concerning nodules or masses. Musculoskeletal: No acute osseous abnormality or suspicious osseous lesion. Exaggerated thoracic kyphosis with multilevel bony fusion across the vertebral bodies and posterior elements noted T9-T12 as well as more pronounced spondylitic changes at the adjacent T7-T8, T8-T9 levels. Prior cervical fusion noted as well. No acute or worrisome osseous abnormality. Minimal degenerative changes of the shoulders. No worrisome chest wall mass or lesion. Diffuse body wall edema. Review of the  MIP images confirms the above findings. CT ABDOMEN and PELVIS FINDINGS Hepatobiliary: No worrisome focal liver lesions. Smooth liver surface contour. Normal hepatic attenuation. Gallbladder is mildly distended with some layering hyperdense material, could reflect biliary sand or if patient has received additional contrast media in recent days, possible vicarious extravasation of contrast media. No visible intraductal gallstones or significant biliary ductal dilatation. Pancreas: Normal tapering of the distal common bile duct. No pancreatic inflammation or discernible pancreatic lesion. Spleen: Normal in size. No concerning splenic lesions. Small accessory splenule posteriorly. Adrenals/Urinary Tract: Normal adrenals. Kidneys are normally located with symmetric enhancement and excretion. Fluid attenuation cyst seen in the upper pole right kidney measuring up to 2.2 cm in size. No suspicious renal lesion, urolithiasis or hydronephrosis. Urinary bladder is largely decompressed at the time of exam and therefore poorly evaluated by CT imaging. No gross bladder abnormality accounting for underdistention. Stomach/Bowel: Distal esophagus unremarkable. Stomach and duodenum are free of acute abnormality with a normal duodenal sweep across the midline abdomen. No small bowel thickening or dilatation. No evidence of high-grade small bowel obstruction. No clear colonic thickening or dilatation accounting for varying degrees of underdistention. Appendix is not visualized. No focal inflammation the vicinity of the cecum to suggest an occult appendicitis. Vascular/Lymphatic: Atherosclerotic calcifications within the abdominal aorta and branch vessels. No aneurysm or ectasia. Small amount of fluid seen surrounding the origin of a right common femoral arteriovenous fistula graft (6/68). No pathologically  enlarged abdominopelvic lymph nodes. Reproductive: Retroverted uterus with calcified uterine fibroids. No concerning adnexal mass.  Other: Diffuse body wall edema. Mild edematous changes in the mesentery. No abdominopelvic free air or fluid. No bowel containing hernia. Postsurgical changes of the anterior abdominal wall including a small umbilical hernia into which protrudes portion of the anti mesenteric transverse colon (6/39) albeit without evidence of vascular compromise or obstruction at this level. Musculoskeletal: Diffuse osseous manifestations of renal osteodystrophy. Grade 2 anterolisthesis L4 on L5 without spondylolysis. Multilevel discogenic and facet degenerative changes are maximal at the L4-5 level as well. Additional degenerative changes in the bilateral hips and additional arthrosis at the SI joints and symphysis pubis. Review of the MIP images confirms the above findings. IMPRESSION: 1. No evidence of pulmonary artery embolism. 2. Cardiomegaly and small pericardial effusion. 3. Diffuse body wall edema could reflect early volume overload/anasarca. 4. Distended gallbladder with layering hyperdensity, could reflect tiny biliary stones/sand or vicarious extravasation of contrast if patient has had a recent contrasted exam. While there is no pericholecystic fluid or inflammation, could correlate for right upper quadrant symptoms as isolated distension can be an early sign of developing cholecystitis. No biliary ductal dilatation or intraductal gallstones. 5. Small amount of fluid surrounding the anastomosis of a right femoral AV fistula in the right inguinal chain. Could reflect small chronic postoperative seroma though should correlate fistula function and local assessment of the inguinal region. 6. Small broad-based umbilical hernia into which protrudes portion of the anti mesenteric transverse colon without evidence of vascular compromise or obstruction. 7. Diffuse osseous manifestations of renal osteodystrophy. Electronically Signed: By: Lovena Le M.D. On: 01/18/2021 00:18   CT ABDOMEN PELVIS W CONTRAST  Addendum Date:  01/18/2021   ADDENDUM REPORT: 01/18/2021 00:36 ADDENDUM: 8 mm ground-glass opacity seen in the left lung apex. Possibly an acute infectious or inflammatory process though neoplasm is not fully excluded. Initial follow-up with CT at 6-12 months is recommended to confirm persistence. If persistent, repeat CT is recommended every 2 years until 5 years of stability has been established. This recommendation follows the consensus statement: Guidelines for Management of Incidental Pulmonary Nodules Detected on CT Images: From the Fleischner Society 2017; Radiology 2017; 284:228-243. Electronically Signed   By: Lovena Le M.D.   On: 01/18/2021 00:36   Result Date: 01/18/2021 CLINICAL DATA:  Chest discomfort, nausea and vomiting EXAM: CT ANGIOGRAPHY CHEST CT ABDOMEN AND PELVIS WITH CONTRAST TECHNIQUE: Multidetector CT imaging of the chest was performed using the standard protocol during bolus administration of intravenous contrast. Multiplanar CT image reconstructions and MIPs were obtained to evaluate the vascular anatomy. Multidetector CT imaging of the abdomen and pelvis was performed using the standard protocol during bolus administration of intravenous contrast. CONTRAST:  21mL OMNIPAQUE IOHEXOL 350 MG/ML SOLN COMPARISON:  Radiograph 01/17/2021 some ultrasound abdomen 04/21/2018 FINDINGS: CTA CHEST FINDINGS Cardiovascular: Satisfactory opacification the pulmonary arteries to the segmental level. No pulmonary artery filling defects are identified. Central pulmonary arteries are normal caliber. Cardiomegaly. Small pericardial effusion. Suboptimal opacification of the thoracic aorta for luminal assessment. Atherosclerotic plaque within the normal caliber aorta. No gross acute aortic abnormality is evident. Normal 3 vessel branching of the aortic arch. Proximal great vessels are unremarkable. Mediastinum/Nodes: No mediastinal fluid or gas. Normal thyroid gland and thoracic inlet. No acute abnormality of the trachea or  esophagus. No worrisome mediastinal, hilar or axillary adenopathy. Lungs/Pleura: Low lung volumes and mixed dependent and subsegmental atelectatic changes. 8 mm ground-glass opacity seen in left upper lobe (4/25).  No other consolidative opacities in the lungs. No pneumothorax. No layering effusion. No other concerning nodules or masses. Musculoskeletal: No acute osseous abnormality or suspicious osseous lesion. Exaggerated thoracic kyphosis with multilevel bony fusion across the vertebral bodies and posterior elements noted T9-T12 as well as more pronounced spondylitic changes at the adjacent T7-T8, T8-T9 levels. Prior cervical fusion noted as well. No acute or worrisome osseous abnormality. Minimal degenerative changes of the shoulders. No worrisome chest wall mass or lesion. Diffuse body wall edema. Review of the MIP images confirms the above findings. CT ABDOMEN and PELVIS FINDINGS Hepatobiliary: No worrisome focal liver lesions. Smooth liver surface contour. Normal hepatic attenuation. Gallbladder is mildly distended with some layering hyperdense material, could reflect biliary sand or if patient has received additional contrast media in recent days, possible vicarious extravasation of contrast media. No visible intraductal gallstones or significant biliary ductal dilatation. Pancreas: Normal tapering of the distal common bile duct. No pancreatic inflammation or discernible pancreatic lesion. Spleen: Normal in size. No concerning splenic lesions. Small accessory splenule posteriorly. Adrenals/Urinary Tract: Normal adrenals. Kidneys are normally located with symmetric enhancement and excretion. Fluid attenuation cyst seen in the upper pole right kidney measuring up to 2.2 cm in size. No suspicious renal lesion, urolithiasis or hydronephrosis. Urinary bladder is largely decompressed at the time of exam and therefore poorly evaluated by CT imaging. No gross bladder abnormality accounting for underdistention.  Stomach/Bowel: Distal esophagus unremarkable. Stomach and duodenum are free of acute abnormality with a normal duodenal sweep across the midline abdomen. No small bowel thickening or dilatation. No evidence of high-grade small bowel obstruction. No clear colonic thickening or dilatation accounting for varying degrees of underdistention. Appendix is not visualized. No focal inflammation the vicinity of the cecum to suggest an occult appendicitis. Vascular/Lymphatic: Atherosclerotic calcifications within the abdominal aorta and branch vessels. No aneurysm or ectasia. Small amount of fluid seen surrounding the origin of a right common femoral arteriovenous fistula graft (6/68). No pathologically enlarged abdominopelvic lymph nodes. Reproductive: Retroverted uterus with calcified uterine fibroids. No concerning adnexal mass. Other: Diffuse body wall edema. Mild edematous changes in the mesentery. No abdominopelvic free air or fluid. No bowel containing hernia. Postsurgical changes of the anterior abdominal wall including a small umbilical hernia into which protrudes portion of the anti mesenteric transverse colon (6/39) albeit without evidence of vascular compromise or obstruction at this level. Musculoskeletal: Diffuse osseous manifestations of renal osteodystrophy. Grade 2 anterolisthesis L4 on L5 without spondylolysis. Multilevel discogenic and facet degenerative changes are maximal at the L4-5 level as well. Additional degenerative changes in the bilateral hips and additional arthrosis at the SI joints and symphysis pubis. Review of the MIP images confirms the above findings. IMPRESSION: 1. No evidence of pulmonary artery embolism. 2. Cardiomegaly and small pericardial effusion. 3. Diffuse body wall edema could reflect early volume overload/anasarca. 4. Distended gallbladder with layering hyperdensity, could reflect tiny biliary stones/sand or vicarious extravasation of contrast if patient has had a recent  contrasted exam. While there is no pericholecystic fluid or inflammation, could correlate for right upper quadrant symptoms as isolated distension can be an early sign of developing cholecystitis. No biliary ductal dilatation or intraductal gallstones. 5. Small amount of fluid surrounding the anastomosis of a right femoral AV fistula in the right inguinal chain. Could reflect small chronic postoperative seroma though should correlate fistula function and local assessment of the inguinal region. 6. Small broad-based umbilical hernia into which protrudes portion of the anti mesenteric transverse colon without evidence of vascular  compromise or obstruction. 7. Diffuse osseous manifestations of renal osteodystrophy. Electronically Signed: By: Lovena Le M.D. On: 01/18/2021 00:18   US Abdomen Limited RUQ (LIVER/GB)  Result Date: 01/18/2021 CLINICAL DATA:  Hyperbilirubinemia with gallbladder debris seen on recent CT. EXAM: ULTRASOUND ABDOMEN LIMITED RIGHT UPPER QUADRANT COMPARISON:  April 21, 2018 FINDINGS: Gallbladder: A layer of shadowing, markedly echogenic material is seen within the dependent portion of a markedly distended gallbladder lumen (11.2 cm in length). This corresponds to the findings seen on the prior abdomen pelvis CT. No gallbladder thickening is visualized (2.3 mm). No sonographic Murphy sign noted by sonographer. Common bile duct: Diameter: 3.7 mm Liver: No focal lesion identified. The liver parenchyma is mildly nodular in contour and within normal limits in parenchymal echogenicity. Portal vein is patent on color Doppler imaging with normal direction of blood flow towards the liver. Other: The study is limited secondary to overlying bowel gas and rapid patient breathing. IMPRESSION: 1. Echogenic material within the gallbladder lumen which may represent milk of calcium versus retained contrast from a recent study. 2. No acute cholecystitis. Electronically Signed   By: Virgina Norfolk M.D.    On: 01/18/2021 03:48    Dialysis Orders:  [facility] TTS 4h 400/1.5A EDW 67 kg 2K/2Ca UFP 4 AVG Heparin 4000 Mircera 60 q 2wks Calcitriol 1.5 TIW   Assessment/Plan: Sepsis 2/2 Granulicatella bacteremia. -Per primary. Infectious disease consulted. Thigh AVG w/o signs of infection.  ESRD -  HD TTS. Continue on schedule. Next HD 7/26.  Hypertension/volume  - BP controlled. Has been getting below dry weight. Challenge EDW next HD.  Anemia  - On ESA. Next due 7/28.  Metabolic bone disease -  Ca ok. Continue home meds.    Lynnda Child PA-C [facility] 01/19/2021, 4:30 PM

## 2021-01-19 NOTE — Progress Notes (Signed)
Inpatient Diabetes Program Recommendations  AACE/ADA: New Consensus Statement on Inpatient Glycemic Control (2015)  Target Ranges:  Prepandial:   less than 140 mg/dL      Peak postprandial:   less than 180 mg/dL (1-2 hours)      Critically ill patients:  140 - 180 mg/dL   Lab Results  Component Value Date   GLUCAP 289 (H) 01/19/2021   HGBA1C 12.3 (H) 02/02/2018    Review of Glycemic Control Results for Belinda, [name] (MRN [id number]) as of 01/19/2021 09:35  Ref. Range 01/18/2021 12:18 01/18/2021 17:20 01/18/2021 21:05 01/18/2021 23:05 01/19/2021 07:44  Glucose-Capillary Latest Ref Range: 70 - 99 mg/dL 234 (H) 293 (H) 347 (H) 351 (H) 289 (H)   Diabetes history: DM2 Outpatient Diabetes medications: Humalog 4-10 units TID, Lantus 18 units QHS Current orders for Inpatient glycemic control: Novolog 0-9 units TID  Inpatient Diabetes Program Recommendations:    Please consider: Lantus 10 units daily   Will continue to follow while inpatient.  Thank you, Reche Dixon, RN, BSN Diabetes Coordinator Inpatient Diabetes Program [phone] (team pager from 8a-5p)

## 2021-01-19 NOTE — Progress Notes (Signed)
  Echocardiogram 2D Echocardiogram has been performed.  Belinda [name] 01/19/2021, 3:17 PM

## 2021-01-19 NOTE — Progress Notes (Signed)
Patient reports 10/10 pain in her back. Pain worsen with inspiration. She is diaphoretic. Tylenol provided no relief. On call MD notified via paging system. Agricultural consultant at bedside and rapid response nurse called. ECG completed.

## 2021-01-20 ENCOUNTER — Inpatient Hospital Stay (HOSPITAL_COMMUNITY): Payer: Medicare Other

## 2021-01-20 ENCOUNTER — Encounter (HOSPITAL_COMMUNITY): Payer: Self-pay

## 2021-01-20 DIAGNOSIS — N186 End stage renal disease: Secondary | ICD-10-CM

## 2021-01-20 DIAGNOSIS — Z20822 Contact with and (suspected) exposure to covid-19: Secondary | ICD-10-CM | POA: Diagnosis present

## 2021-01-20 DIAGNOSIS — F32A Depression, unspecified: Secondary | ICD-10-CM | POA: Diagnosis present

## 2021-01-20 DIAGNOSIS — R112 Nausea with vomiting, unspecified: Secondary | ICD-10-CM | POA: Diagnosis present

## 2021-01-20 DIAGNOSIS — D696 Thrombocytopenia, unspecified: Secondary | ICD-10-CM | POA: Diagnosis present

## 2021-01-20 DIAGNOSIS — A419 Sepsis, unspecified organism: Secondary | ICD-10-CM

## 2021-01-20 DIAGNOSIS — J45909 Unspecified asthma, uncomplicated: Secondary | ICD-10-CM | POA: Diagnosis present

## 2021-01-20 DIAGNOSIS — E1122 Type 2 diabetes mellitus with diabetic chronic kidney disease: Secondary | ICD-10-CM | POA: Diagnosis present

## 2021-01-20 DIAGNOSIS — Z992 Dependence on renal dialysis: Secondary | ICD-10-CM

## 2021-01-20 DIAGNOSIS — R7881 Bacteremia: Secondary | ICD-10-CM | POA: Diagnosis present

## 2021-01-20 DIAGNOSIS — D631 Anemia in chronic kidney disease: Secondary | ICD-10-CM | POA: Diagnosis present

## 2021-01-20 DIAGNOSIS — I132 Hypertensive heart and chronic kidney disease with heart failure and with stage 5 chronic kidney disease, or end stage renal disease: Secondary | ICD-10-CM | POA: Diagnosis present

## 2021-01-20 DIAGNOSIS — M546 Pain in thoracic spine: Secondary | ICD-10-CM | POA: Diagnosis not present

## 2021-01-20 DIAGNOSIS — I5022 Chronic systolic (congestive) heart failure: Secondary | ICD-10-CM | POA: Diagnosis present

## 2021-01-20 DIAGNOSIS — R000 Tachycardia, unspecified: Secondary | ICD-10-CM | POA: Diagnosis present

## 2021-01-20 DIAGNOSIS — R0902 Hypoxemia: Secondary | ICD-10-CM | POA: Diagnosis present

## 2021-01-20 DIAGNOSIS — E871 Hypo-osmolality and hyponatremia: Secondary | ICD-10-CM | POA: Diagnosis present

## 2021-01-20 DIAGNOSIS — G8929 Other chronic pain: Secondary | ICD-10-CM | POA: Diagnosis present

## 2021-01-20 DIAGNOSIS — I358 Other nonrheumatic aortic valve disorders: Secondary | ICD-10-CM | POA: Diagnosis not present

## 2021-01-20 DIAGNOSIS — I313 Pericardial effusion (noninflammatory): Secondary | ICD-10-CM | POA: Diagnosis present

## 2021-01-20 DIAGNOSIS — J9811 Atelectasis: Secondary | ICD-10-CM | POA: Diagnosis present

## 2021-01-20 DIAGNOSIS — R197 Diarrhea, unspecified: Secondary | ICD-10-CM | POA: Diagnosis present

## 2021-01-20 DIAGNOSIS — N2581 Secondary hyperparathyroidism of renal origin: Secondary | ICD-10-CM | POA: Diagnosis present

## 2021-01-20 DIAGNOSIS — E785 Hyperlipidemia, unspecified: Secondary | ICD-10-CM | POA: Diagnosis present

## 2021-01-20 DIAGNOSIS — A408 Other streptococcal sepsis: Secondary | ICD-10-CM | POA: Diagnosis present

## 2021-01-20 DIAGNOSIS — I2721 Secondary pulmonary arterial hypertension: Secondary | ICD-10-CM | POA: Diagnosis present

## 2021-01-20 DIAGNOSIS — Z9115 Patient's noncompliance with renal dialysis: Secondary | ICD-10-CM | POA: Diagnosis not present

## 2021-01-20 LAB — MRSA NEXT GEN BY PCR, NASAL: MRSA by PCR Next Gen: NOT DETECTED

## 2021-01-20 LAB — CBC
HCT: 26.5 % — ABNORMAL LOW (ref 36.0–46.0)
Hemoglobin: 9.5 g/dL — ABNORMAL LOW (ref 12.0–15.0)
MCH: 28.7 pg (ref 26.0–34.0)
MCHC: 35.8 g/dL (ref 30.0–36.0)
MCV: 80.1 fL (ref 80.0–100.0)
Platelets: 141 10*3/uL — ABNORMAL LOW (ref 150–400)
RBC: 3.31 MIL/uL — ABNORMAL LOW (ref 3.87–5.11)
RDW: 19.2 % — ABNORMAL HIGH (ref 11.5–15.5)
WBC: 14 10*3/uL — ABNORMAL HIGH (ref 4.0–10.5)
nRBC: 0 % (ref 0.0–0.2)

## 2021-01-20 LAB — COMPREHENSIVE METABOLIC PANEL
ALT: 30 U/L (ref 0–44)
AST: 20 U/L (ref 15–41)
Albumin: 2.2 g/dL — ABNORMAL LOW (ref 3.5–5.0)
Alkaline Phosphatase: 203 U/L — ABNORMAL HIGH (ref 38–126)
Anion gap: 17 — ABNORMAL HIGH (ref 5–15)
BUN: 64 mg/dL — ABNORMAL HIGH (ref 8–23)
CO2: 25 mmol/L (ref 22–32)
Calcium: 8.4 mg/dL — ABNORMAL LOW (ref 8.9–10.3)
Chloride: 87 mmol/L — ABNORMAL LOW (ref 98–111)
Creatinine, Ser: 10.62 mg/dL — ABNORMAL HIGH (ref 0.44–1.00)
GFR, Estimated: 4 mL/min — ABNORMAL LOW (ref >60)
Glucose, Bld: 310 mg/dL — ABNORMAL HIGH (ref 70–99)
Potassium: 4 mmol/L (ref 3.5–5.1)
Sodium: 129 mmol/L — ABNORMAL LOW (ref 135–145)
Total Bilirubin: 1.1 mg/dL (ref 0.3–1.2)
Total Protein: 5.6 g/dL — ABNORMAL LOW (ref 6.5–8.1)

## 2021-01-20 LAB — GLUCOSE, CAPILLARY
Glucose-Capillary: 283 mg/dL — ABNORMAL HIGH (ref 70–99)
Glucose-Capillary: 292 mg/dL — ABNORMAL HIGH (ref 70–99)
Glucose-Capillary: 149 mg/dL — ABNORMAL HIGH (ref 70–99)
Glucose-Capillary: 256 mg/dL — ABNORMAL HIGH (ref 70–99)

## 2021-01-20 LAB — MAGNESIUM: Magnesium: 2.2 mg/dL (ref 1.7–2.4)

## 2021-01-20 MED ORDER — LIDOCAINE HCL (PF) 1 % IJ SOLN: 5.0000 mL | INTRAMUSCULAR | Status: DC | PRN

## 2021-01-20 MED ORDER — ALTEPLASE 2 MG IJ SOLR: 2.0000 mg | Freq: Once | INTRAMUSCULAR | Status: DC | PRN

## 2021-01-20 MED ORDER — PENTAFLUOROPROP-TETRAFLUOROETH EX AERO: 1.0000 "application " | INHALATION_SPRAY | CUTANEOUS | Status: DC | PRN

## 2021-01-20 MED ORDER — LORAZEPAM 2 MG/ML IJ SOLN: 0.5000 mg | Freq: Once | INTRAMUSCULAR | Status: DC

## 2021-01-20 MED ORDER — MIDODRINE HCL 5 MG PO TABS
10.0000 mg | ORAL_TABLET | ORAL | Status: DC
  Administered 2021-01-20 – 2021-01-24 (×3): 10 mg via ORAL
  Filled 2021-01-20 (×3): qty 2

## 2021-01-20 MED ORDER — HEPARIN SODIUM (PORCINE) 1000 UNIT/ML DIALYSIS: 1000.0000 [IU] | INTRAMUSCULAR | Status: DC | PRN

## 2021-01-20 MED ORDER — SODIUM CHLORIDE 0.9 % IV SOLN: 100.0000 mL | INTRAVENOUS | Status: DC | PRN

## 2021-01-20 MED ORDER — HEPARIN SODIUM (PORCINE) 1000 UNIT/ML DIALYSIS: 20.0000 [IU]/kg | INTRAMUSCULAR | Status: DC | PRN

## 2021-01-20 MED ORDER — VANCOMYCIN HCL 750 MG/150ML IV SOLN
INTRAVENOUS | Status: AC
  Administered 2021-01-20: 750 mg via INTRAVENOUS
  Filled 2021-01-20: qty 150

## 2021-01-20 MED ORDER — INSULIN GLARGINE 100 UNIT/ML ~~LOC~~ SOLN
14.0000 [IU] | Freq: Every day | SUBCUTANEOUS | Status: DC
  Administered 2021-01-21 – 2021-01-23 (×3): 14 [IU] via SUBCUTANEOUS
  Filled 2021-01-20 (×3): qty 0.14

## 2021-01-20 MED ORDER — DIPHENHYDRAMINE HCL 25 MG PO CAPS
25.0000 mg | ORAL_CAPSULE | Freq: Four times a day (QID) | ORAL | Status: DC | PRN
  Administered 2021-01-20: 50 mg via ORAL
  Administered 2021-01-24 – 2021-01-25 (×2): 25 mg via ORAL
  Filled 2021-01-20: qty 2
  Filled 2021-01-20 (×2): qty 1

## 2021-01-20 MED ORDER — LIDOCAINE-PRILOCAINE 2.5-2.5 % EX CREA: 1.0000 "application " | TOPICAL_CREAM | CUTANEOUS | Status: DC | PRN

## 2021-01-20 NOTE — Progress Notes (Signed)
[facility] Progress Note   Subjective:   Patient seen and examined at bedside.  Denies n/v/d, abdominal pain and CP.  Admits to SOB.  Does not believe she lost weight when sick last week.    Objective Vitals:   01/20/21 0331 01/20/21 0535 01/20/21 0822 01/20/21 1158  BP: (!) 164/81  140/72 (!) 142/69  Pulse: 100  93 88  Resp: 17  18 (!) 21  Temp: 98.8 F (37.1 C)  98.2 F (36.8 C) 97.9 F (36.6 C)  TempSrc: Oral  Axillary Oral  SpO2: 96%  96% 97%  Weight:  69.9 kg    Height:       Physical Exam General:well appearing female in NAD Heart:RRR, no mrg Lungs:CTAB, nml WOB on 2L via [place] Abdomen:soft, NTND Extremities:no LE edema Dialysis Access: R thigh AVF, no erythema/edema or tenderness   Filed Weights   01/20/21 0535  Weight: 69.9 kg    Intake/Output Summary (Last 24 hours) at 01/20/2021 1205 Last data filed at 01/20/2021 0700 Gross per 24 hour  Intake 300 ml  Output --  Net 300 ml    Additional Objective Labs: Basic Metabolic Panel: Recent Labs  Lab 01/18/21 0545 01/19/21 0046 01/20/21 0214  NA 133* 129* 129*  K 3.9 3.8 4.0  CL 89* 88* 87*  CO2 28 26 25   GLUCOSE 100* 346* 310*  BUN 38* 53* 64*  CREATININE 7.41* 8.83* 10.62*  CALCIUM 8.7* 8.5* 8.4*   Liver Function Tests: Recent Labs  Lab 01/17/21 1805 01/18/21 0545 01/20/21 0214  AST 41 31 20  ALT 67* 46* 30  ALKPHOS 242* 196* 203*  BILITOT 2.1* 1.8* 1.1  PROT 7.1 5.6* 5.6*  ALBUMIN 3.3* 2.6* 2.2*   CBC: Recent Labs  Lab 01/17/21 1805 01/18/21 0545 01/19/21 0046 01/20/21 0214  WBC 9.4 8.0 8.7 14.0*  NEUTROABS 7.1 5.2  --   --   HGB 11.0* 10.0* 8.9* 9.5*  HCT 31.1* 29.2* 25.0* 26.5*  MCV 82.7 84.6 81.4 80.1  PLT PLATELET CLUMPS NOTED ON SMEAR, UNABLE TO ESTIMATE 79* 88* 141*   Blood Culture    Component Value Date/Time   SDES BLOOD LEFT HAND 01/19/2021 0858   SPECREQUEST  01/19/2021 0858    BOTTLES DRAWN AEROBIC ONLY Blood Culture results may not be optimal due to  an inadequate volume of blood received in culture bottles   CULT  01/19/2021 0858    NO GROWTH 1 DAY Performed at [facility], [address], [place],  [address]    REPTSTATUS PENDING 01/19/2021 0858   CBG: Recent Labs  Lab 01/19/21 0744 01/19/21 1128 01/19/21 1541 01/19/21 2018 01/20/21 0749  GLUCAP 289* 198* 226* 350* 283*   Studies/Results: ECHOCARDIOGRAM COMPLETE  Result Date: 01/19/2021    ECHOCARDIOGRAM REPORT   Patient Name:   Belinda [name] Date of Exam: 01/19/2021 Medical Rec #:  [id number]        Height:       60.0 in Accession #:    [id number]       Weight:       150.8 lb Date of Birth:  July 28, 1950       BSA:          1.656 m Patient Age:    70 years         BP:           152/71 mmHg Patient Gender: F  HR:           88 bpm. Exam Location:  Inpatient Procedure: 2D Echo, Cardiac Doppler and Color Doppler Indications:    Bacteremia  History:        Patient has prior history of Echocardiogram examinations, most                 recent 10/15/2020. CHF, Signs/Symptoms:ESRD, anemia, ERSD; Risk                 Factors:Diabetes, Hypertension and Dyslipidemia.  Sonographer:    Dustin Flock Referring Phys: [id number] Shafer D Lasker  1. Left ventricular ejection fraction, by estimation, is 55%. The left ventricle has normal function. The left ventricle has no regional wall motion abnormalities. There is mild left ventricular hypertrophy. Left ventricular diastolic parameters are consistent with Grade I diastolic dysfunction (impaired relaxation).  2. Peak RV-RA gradient 22 mmHg. Right ventricular systolic function is mildly reduced. The right ventricular size is normal.  3. The mitral valve is normal in structure. Trivial mitral valve regurgitation. No evidence of mitral stenosis.  4. The aortic valve is tricuspid. Aortic valve regurgitation is not visualized. No aortic stenosis is present.  5. The IVC was not visualized.  6. Technically difficult study  with poor acoustic windows. FINDINGS  Left Ventricle: Left ventricular ejection fraction, by estimation, is 55%. The left ventricle has normal function. The left ventricle has no regional wall motion abnormalities. The left ventricular internal cavity size was normal in size. There is mild left ventricular hypertrophy. Left ventricular diastolic parameters are consistent with Grade I diastolic dysfunction (impaired relaxation). Right Ventricle: Peak RV-RA gradient 22 mmHg. The right ventricular size is normal. No increase in right ventricular wall thickness. Right ventricular systolic function is mildly reduced. Left Atrium: Left atrial size was normal in size. Right Atrium: Right atrial size was normal in size. Pericardium: There is no evidence of pericardial effusion. Mitral Valve: The mitral valve is normal in structure. Trivial mitral valve regurgitation. No evidence of mitral valve stenosis. Tricuspid Valve: The tricuspid valve is normal in structure. Tricuspid valve regurgitation is trivial. Aortic Valve: The aortic valve is tricuspid. Aortic valve regurgitation is not visualized. No aortic stenosis is present. Aortic valve peak gradient measures 10.8 mmHg. Pulmonic Valve: The pulmonic valve was not well visualized. Pulmonic valve regurgitation is not visualized. Aorta: The aortic root is normal in size and structure. Venous: The inferior vena cava was not well visualized. IAS/Shunts: No atrial level shunt detected by color flow Doppler.  LEFT VENTRICLE PLAX 2D LVIDd:         3.80 cm  Diastology LVIDs:         2.90 cm  LV e' medial:    3.26 cm/s LV PW:         1.00 cm  LV E/e' medial:  29.3 LV IVS:        1.30 cm  LV e' lateral:   4.68 cm/s LVOT diam:     2.00 cm  LV E/e' lateral: 20.4 LV SV:         71 LV SV Index:   43 LVOT Area:     3.14 cm  RIGHT VENTRICLE RV Basal diam:  3.30 cm RV S prime:     9.90 cm/s TAPSE (M-mode): 2.3 cm LEFT ATRIUM           Index       RIGHT ATRIUM           Index LA  diam:       4.00 cm 2.42 cm/m  RA Area:     16.90 cm LA Vol (A4C): 55.4 ml 33.46 ml/m RA Volume:   41.10 ml  24.83 ml/m  AORTIC VALVE AV Area (Vmax): 2.16 cm AV Vmax:        164.00 cm/s AV Peak Grad:   10.8 mmHg LVOT Vmax:      113.00 cm/s LVOT Vmean:     77.300 cm/s LVOT VTI:       0.225 m  AORTA Ao Root diam: 3.00 cm MITRAL VALVE                TRICUSPID VALVE MV Area (PHT): 6.32 cm     TR Peak grad:   22.1 mmHg MV Decel Time: 120 msec     TR Vmax:        235.00 cm/s MV E velocity: 95.40 cm/s MV A velocity: 136.00 cm/s  SHUNTS MV E/A ratio:  0.70         Systemic VTI:  0.22 m                             Systemic Diam: 2.00 cm Loralie Champagne MD Electronically signed by Loralie Champagne MD Signature Date/Time: 01/19/2021/5:51:51 PM    Final     Medications:  [START ON 01/21/2021] sodium chloride     [START ON 01/21/2021] sodium chloride     vancomycin      aspirin EC  81 mg Oral Once per day on Mon Wed Fri   Chlorhexidine Gluconate Cloth  6 each Topical Daily   simvastatin  40 mg Oral QHS   And   ezetimibe  10 mg Oral QHS   heparin injection (subcutaneous)  5,000 Units Subcutaneous Q8H   insulin aspart  0-9 Units Subcutaneous TID WC   insulin glargine  10 Units Subcutaneous Daily   losartan  12.5 mg Oral Daily   midodrine  10 mg Oral Once per day on Tue Thu Sat   mometasone-formoterol  2 puff Inhalation BID    Dialysis Orders: [facility] TTS 4h 400/1.5A EDW 67 kg 2K/2Ca UFP 4 AVG Heparin 4000 Mircera 60 q 2wks Calcitriol 1.5 TIW    Assessment/Plan: Sepsis 2/2 Granulicatella bacteremia. -Per primary. Thigh AVG w/o signs of infection. ID consulted, recommend TEE, MRI spine, repeat BC and continue vancomycin.  Hypoxia - on O2.  CXR with hypoinflation of the lungs and atelectasis, CT chest with 8 mm ground-glass opacity seen in the left lung apex, infection vs inflammation vs neoplasm. Per PMD ESRD -  HD TTS. HD today per regular schedule.  Hypertension/volume  - BP controlled. On midodrine with HD. Has been  getting below dry weight. Challenge EDW next HD.  Anemia  - Hgb 9.5, On ESA. Next due 7/28.  Metabolic bone disease -  Ca ok. Will check phos. Continue VDRA and binders.  DMT2 Chronic systolic HF  Jen Mow, PA-C [place] 01/20/2021,12:05 PM  LOS: 0 days

## 2021-01-20 NOTE — Progress Notes (Signed)
Inpatient Diabetes Program Recommendations  AACE/ADA: New Consensus Statement on Inpatient Glycemic Control (2015)  Target Ranges:  Prepandial:   less than 140 mg/dL      Peak postprandial:   less than 180 mg/dL (1-2 hours)      Critically ill patients:  140 - 180 mg/dL   Lab Results  Component Value Date   GLUCAP 283 (H) 01/20/2021   HGBA1C 8.0 (H) 01/18/2021    Review of Glycemic Control Results for PAIDEN, CARAVEO (MRN [id number]) as of 01/20/2021 11:11  Ref. Range 01/19/2021 07:44 01/19/2021 11:28 01/19/2021 15:41 01/19/2021 20:18 01/20/2021 07:49  Glucose-Capillary Latest Ref Range: 70 - 99 mg/dL 289 (H) 198 (H) 226 (H) 350 (H) 283 (H)   Diabetes history: DM2 Outpatient Diabetes medications: Humalog 4-10 units TID, Lantus 18 units QHS Current orders for Inpatient glycemic control: Novolog 0-9 units TID, Lantus 10 units QD  Inpatient Diabetes Program Recommendations:    Semglee 15 units QD (could give additional 5 units now since 10 units has already been given today). Novolog 0-15 units TID and 0-5 units QHS  Will continue to follow while inpatient.  Thank you, Reche Dixon, RN, BSN Diabetes Coordinator Inpatient Diabetes Program [phone] (team pager from 8a-5p)

## 2021-01-20 NOTE — Progress Notes (Signed)
PROGRESS NOTE    Belinda [name]  EHU:[id number] DOB: 10/02/1950 DOA: 01/17/2021 PCP: Reynold Bowen, MD   Brief Narrative:   Belinda [name] is a 70 y.o. female with medical history significant of chronic systolic heart failure, ESRD anemia, ESRD on hemodialysis, anxiety/depression, chronic cervical and lumbar back pain, GERD, history of unspecified MRSA infection   She states that she has been ill for about a week, had a sandwich from [place] [employer] on Sunday then subsequently started to have multiple episodes of vomiting.  Then on Wednesday, finished her leftover sandwich, then continued to have multiple episodes of diarrhea through the day.  Due to feeling ill, she missed her dialysis on Tuesday and Thursday.  She was able to complete her session on Saturday, however, she continued to have worsening generalized weakness, had pulse ox with oxygen saturation in the high 80s, requiring nasal cannula O2.  She appeared to have some symptoms suggesting gastroenteritis, but is now noted to have gram-positive bacteremia with associated sepsis and is improving.  Nephrology plans for hemodialysis by 7/26. Discussed case with ID/cardiology who recommends TEE which cannot be done until 7/28.  Assessment & Plan:   Principal Problem:   Sepsis due to undetermined organism [facility]) Active Problems:   Essential hypertension   Chronic systolic (congestive) heart failure (HCC)   DM (diabetes mellitus), type 2 with renal complications (HCC)   ESRD on hemodialysis (HCC)   Anemia due to end stage renal disease (HCC)   Abnormal liver function tests   Hypoxia   Sepsis present on admission secondary to granulicatella adiacens -Related to strep variant -2D echocardiogram: grade 1 diastolic -Patient will need TEE, and Cardiology states that earliest this could be done is 7/28 -ID consult appreciated -Continue vancomycin with no further need for cefepime or flagyl per ID -Right femoral fistula site without  signs of overt infection -worsening mid-back pain-- will get MRI  Hypoxemia -This appears to be mild and possibly related to some atelectasis -Trial of incentive spirometry and weaning of supplemental oxygen  Mild transaminitis -ultrasound without signs of acute cholecystitis  Hyponatremia -Per nephrology, plans for hemodialysis by 7/26  Essential hypertension -Currently stable -Resumed on losartan and midodrine as she was taking at home  Chronic systolic heart failure -No signs of volume overload or decompensation at this time -Hemodialysis per nephrology to resume on 7/26  Type 2 diabetes -Carb modified diet -Hemoglobin A1c 8.0%  ESRD on HD TTS -Nephrology consulted for hemodialysis  Anemia due to end-stage renal disease -Continue to monitor repeat CBC with no overt bleeding identified -Erythropoietin per nephrology   DVT prophylaxis: Heparin Code Status: Full Family Communication: Discussed with husband on phone 7/26 Disposition Plan:  Status is: inpt  The patient will require care spanning > 2 midnights and should be moved to inpatient because: IV treatments appropriate due to intensity of illness or inability to take PO and Inpatient level of care appropriate due to severity of illness  Dispo: The patient is from: Home              Anticipated d/c is to: Home              Patient currently is not medically stable to d/c.   Difficult to place patient No  Consultants:  Nephrology ID Dr. Gale [facility]  Procedures:  See below  Antimicrobials:  Anti-infectives (From admission, onward)    Start     Dose/Rate Route Frequency Ordered Stop   01/20/21 1200  vancomycin (VANCOREADY)  IVPB 750 mg/150 mL        750 mg 150 mL/hr over 60 Minutes Intravenous Every T-Th-Sa (Hemodialysis) 01/17/21 2106     01/18/21 2100  ceFEPIme (MAXIPIME) 1 g in sodium chloride 0.9 % 100 mL IVPB  Status:  Discontinued        1 g 200 mL/hr over 30 Minutes Intravenous Every 24 hours  01/17/21 2106 01/19/21 1532   01/18/21 0245  metroNIDAZOLE (FLAGYL) IVPB 500 mg  Status:  Discontinued       Note to Pharmacy: Hyperbilirubinemia with fever and suspicious finding on CT scan abdomen.   500 mg 100 mL/hr over 60 Minutes Intravenous Every 8 hours 01/18/21 0238 01/19/21 1532   01/17/21 2015  ceFEPIme (MAXIPIME) 2 g in sodium chloride 0.9 % 100 mL IVPB        2 g 200 mL/hr over 30 Minutes Intravenous  Once 01/17/21 2005 01/17/21 2055   01/17/21 2015  vancomycin (VANCOREADY) IVPB 1500 mg/300 mL        1,500 mg 150 mL/hr over 120 Minutes Intravenous  Once 01/17/21 2005 01/17/21 2252      Subjective: Mid thoracic back pain that has been worsening  Objective: Vitals:   01/20/21 0331 01/20/21 0535 01/20/21 0822 01/20/21 1158  BP: (!) 164/81  140/72 (!) 142/69  Pulse: 100  93 88  Resp: 17  18 (!) 21  Temp: 98.8 F (37.1 C)  98.2 F (36.8 C) 97.9 F (36.6 C)  TempSrc: Oral  Axillary Oral  SpO2: 96%  96% 97%  Weight:  69.9 kg    Height:        Intake/Output Summary (Last 24 hours) at 01/20/2021 1219 Last data filed at 01/20/2021 0700 Gross per 24 hour  Intake 300 ml  Output --  Net 300 ml   Filed Weights   01/20/21 0535  Weight: 69.9 kg    Examination:   General: Appearance:    Obese female in no acute distress     Lungs:     respirations unlabored  Heart:    Normal heart rate.    MS:   All extremities are intact.    Neurologic:   Awake, alert, oriented x 3      Data Reviewed: I have personally reviewed following labs and imaging studies  CBC: Recent Labs  Lab 01/17/21 1805 01/18/21 0545 01/19/21 0046 01/20/21 0214  WBC 9.4 8.0 8.7 14.0*  NEUTROABS 7.1 5.2  --   --   HGB 11.0* 10.0* 8.9* 9.5*  HCT 31.1* 29.2* 25.0* 26.5*  MCV 82.7 84.6 81.4 80.1  PLT PLATELET CLUMPS NOTED ON SMEAR, UNABLE TO ESTIMATE 79* 88* 259*   Basic Metabolic Panel: Recent Labs  Lab 01/17/21 1805 01/18/21 0545 01/19/21 0046 01/20/21 0214  NA 137 133* 129* 129*   K 4.1 3.9 3.8 4.0  CL 89* 89* 88* 87*  CO2 30 28 26 25   GLUCOSE 159* 100* 346* 310*  BUN 33* 38* 53* 64*  CREATININE 6.43* 7.41* 8.83* 10.62*  CALCIUM 8.9 8.7* 8.5* 8.4*  MG  --   --   --  2.2   GFR: Estimated Creatinine Clearance: 4.4 mL/min (A) (by C-G formula based on SCr of 10.62 mg/dL (H)). Liver Function Tests: Recent Labs  Lab 01/17/21 1805 01/18/21 0545 01/20/21 0214  AST 41 31 20  ALT 67* 46* 30  ALKPHOS 242* 196* 203*  BILITOT 2.1* 1.8* 1.1  PROT 7.1 5.6* 5.6*  ALBUMIN 3.3* 2.6* 2.2*   No results  for input(s): LIPASE, AMYLASE in the last 168 hours. No results for input(s): AMMONIA in the last 168 hours. Coagulation Profile: Recent Labs  Lab 01/17/21 1805  INR 1.0   Cardiac Enzymes: No results for input(s): CKTOTAL, CKMB, CKMBINDEX, TROPONINI in the last 168 hours. BNP (last 3 results) No results for input(s): PROBNP in the last 8760 hours. HbA1C: Recent Labs    01/18/21 0545  HGBA1C 8.0*   CBG: Recent Labs  Lab 01/19/21 0744 01/19/21 1128 01/19/21 1541 01/19/21 2018 01/20/21 0749  GLUCAP 289* 198* 226* 350* 283*   Lipid Profile: No results for input(s): CHOL, HDL, LDLCALC, TRIG, CHOLHDL, LDLDIRECT in the last 72 hours. Thyroid Function Tests: No results for input(s): TSH, T4TOTAL, FREET4, T3FREE, THYROIDAB in the last 72 hours. Anemia Panel: No results for input(s): VITAMINB12, FOLATE, FERRITIN, TIBC, IRON, RETICCTPCT in the last 72 hours. Sepsis Labs: Recent Labs  Lab 01/17/21 1805 01/17/21 2239 01/18/21 0545 01/19/21 0046  PROCALCITON  --   --  43.88 36.36  LATICACIDVEN 2.3* 1.3  --   --     Recent Results (from the past 240 hour(s))  Resp Panel by RT-PCR (Flu A&B, Covid) Nasopharyngeal Swab     Status: None   Collection Time: 01/17/21  5:59 PM   Specimen: Nasopharyngeal Swab; Nasopharyngeal(NP) swabs in vial transport medium  Result Value Ref Range Status   SARS Coronavirus 2 by RT PCR NEGATIVE NEGATIVE Final    Comment:  (NOTE) SARS-CoV-2 target nucleic acids are NOT DETECTED.  The SARS-CoV-2 RNA is generally detectable in upper respiratory specimens during the acute phase of infection. The lowest concentration of SARS-CoV-2 viral copies this assay can detect is 138 copies/mL. A negative result does not preclude SARS-Cov-2 infection and should not be used as the sole basis for treatment or other patient management decisions. A negative result may occur with  improper specimen collection/handling, submission of specimen other than nasopharyngeal swab, presence of viral mutation(s) within the areas targeted by this assay, and inadequate number of viral copies(<138 copies/mL). A negative result must be combined with clinical observations, patient history, and epidemiological information. The expected result is Negative.  Fact Sheet for Patients:  EntrepreneurPulse.com.au  Fact Sheet for Healthcare Providers:  IncredibleEmployment.be  This test is no t yet approved or cleared by the [place] FDA and  has been authorized for detection and/or diagnosis of SARS-CoV-2 by FDA under an Emergency Use Authorization (EUA). This EUA will remain  in effect (meaning this test can be used) for the duration of the COVID-19 declaration under Section 564(b)(1) of the Act, 21 U.S.C.section 360bbb-3(b)(1), unless the authorization is terminated  or revoked sooner.       Influenza A by PCR NEGATIVE NEGATIVE Final   Influenza B by PCR NEGATIVE NEGATIVE Final    Comment: (NOTE) The Xpert Xpress SARS-CoV-2/FLU/RSV plus assay is intended as an aid in the diagnosis of influenza from Nasopharyngeal swab specimens and should not be used as a sole basis for treatment. Nasal washings and aspirates are unacceptable for Xpert Xpress SARS-CoV-2/FLU/RSV testing.  Fact Sheet for Patients: EntrepreneurPulse.com.au  Fact Sheet for Healthcare  Providers: IncredibleEmployment.be  This test is not yet approved or cleared by the [place] FDA and has been authorized for detection and/or diagnosis of SARS-CoV-2 by FDA under an Emergency Use Authorization (EUA). This EUA will remain in effect (meaning this test can be used) for the duration of the COVID-19 declaration under Section 564(b)(1) of the Act, 21 U.S.C. section 360bbb-3(b)(1), unless the  authorization is terminated or revoked.  Performed at [facility], [place] [address] [place], [place] [address]   Blood culture (routine x 2)     Status: Abnormal (Preliminary result)   Collection Time: 01/17/21  8:46 PM   Specimen: BLOOD LEFT HAND  Result Value Ref Range Status   Specimen Description BLOOD LEFT HAND  Final   Special Requests   Final    BOTTLES DRAWN AEROBIC AND ANAEROBIC Blood Culture results may not be optimal due to an inadequate volume of blood received in culture bottles   Culture  Setup Time   Final    GRAM POSITIVE COCCI IN PAIRS AND CHAINS IN BOTH AEROBIC AND ANAEROBIC BOTTLES CRITICAL RESULT CALLED TO, READ BACK BY AND VERIFIED WITH: PHARM D J.ORIET ON [id number] AT [id number] BY E.PARRISH    Culture (A)  Final    GRANULICATELLA ADIACENS CULTURE REINCUBATED FOR BETTER GROWTH Performed at [facility], [place] [address], [place], [place] [address]    Report Status PENDING  Incomplete  Blood culture (routine x 2)     Status: Abnormal (Preliminary result)   Collection Time: 01/17/21  8:46 PM   Specimen: BLOOD  Result Value Ref Range Status   Specimen Description BLOOD LEFT ANTECUBITAL  Final   Special Requests   Final    BOTTLES DRAWN AEROBIC AND ANAEROBIC Blood Culture results may not be optimal due to an inadequate volume of blood received in culture bottles   Culture  Setup Time   Final    GRAM POSITIVE COCCI IN PAIRS AND CHAINS IN BOTH AEROBIC AND ANAEROBIC BOTTLES CRITICAL VALUE NOTED.  VALUE IS CONSISTENT WITH PREVIOUSLY  REPORTED AND CALLED VALUE. Performed at [facility], [place] [address], [place], [place] [address]    Culture GRANULICATELLA ADIACENS (A)  Final   Report Status PENDING  Incomplete  Blood Culture ID Panel (Reflexed)     Status: None   Collection Time: 01/17/21  8:46 PM  Result Value Ref Range Status   Enterococcus faecalis NOT DETECTED NOT DETECTED Final   Enterococcus Faecium NOT DETECTED NOT DETECTED Final   Listeria monocytogenes NOT DETECTED NOT DETECTED Final   Staphylococcus species NOT DETECTED NOT DETECTED Final   Staphylococcus aureus (BCID) NOT DETECTED NOT DETECTED Final   Staphylococcus epidermidis NOT DETECTED NOT DETECTED Final   Staphylococcus lugdunensis NOT DETECTED NOT DETECTED Final   Streptococcus species NOT DETECTED NOT DETECTED Final   Streptococcus agalactiae NOT DETECTED NOT DETECTED Final   Streptococcus pneumoniae NOT DETECTED NOT DETECTED Final   Streptococcus pyogenes NOT DETECTED NOT DETECTED Final   A.calcoaceticus-baumannii NOT DETECTED NOT DETECTED Final   Bacteroides fragilis NOT DETECTED NOT DETECTED Final   Enterobacterales NOT DETECTED NOT DETECTED Final   Enterobacter cloacae complex NOT DETECTED NOT DETECTED Final   Escherichia coli NOT DETECTED NOT DETECTED Final   Klebsiella aerogenes NOT DETECTED NOT DETECTED Final   Klebsiella oxytoca NOT DETECTED NOT DETECTED Final   Klebsiella pneumoniae NOT DETECTED NOT DETECTED Final   Proteus species NOT DETECTED NOT DETECTED Final   Salmonella species NOT DETECTED NOT DETECTED Final   Serratia marcescens NOT DETECTED NOT DETECTED Final   Haemophilus influenzae NOT DETECTED NOT DETECTED Final   Neisseria meningitidis NOT DETECTED NOT DETECTED Final   Pseudomonas aeruginosa NOT DETECTED NOT DETECTED Final   Stenotrophomonas maltophilia NOT DETECTED NOT DETECTED Final   Candida albicans NOT DETECTED NOT DETECTED Final   Candida auris NOT DETECTED NOT DETECTED Final  Candida glabrata NOT DETECTED  NOT DETECTED Final   Candida krusei NOT DETECTED NOT DETECTED Final   Candida parapsilosis NOT DETECTED NOT DETECTED Final   Candida tropicalis NOT DETECTED NOT DETECTED Final   Cryptococcus neoformans/gattii NOT DETECTED NOT DETECTED Final    Comment: Performed at [facility], [place] [address] [place], [place] [address]  Culture, blood (routine x 2)     Status: None (Preliminary result)   Collection Time: 01/19/21  8:56 AM   Specimen: BLOOD LEFT HAND  Result Value Ref Range Status   Specimen Description BLOOD LEFT HAND  Final   Special Requests   Final    BOTTLES DRAWN AEROBIC ONLY Blood Culture results may not be optimal due to an inadequate volume of blood received in culture bottles   Culture   Final    NO GROWTH 1 DAY Performed at [facility], [place] [address] [place], [place] [address]    Report Status PENDING  Incomplete  Culture, blood (routine x 2)     Status: None (Preliminary result)   Collection Time: 01/19/21  8:58 AM   Specimen: BLOOD LEFT HAND  Result Value Ref Range Status   Specimen Description BLOOD LEFT HAND  Final   Special Requests   Final    BOTTLES DRAWN AEROBIC ONLY Blood Culture results may not be optimal due to an inadequate volume of blood received in culture bottles   Culture   Final    NO GROWTH 1 DAY Performed at [facility], [place] [address] [place], [place] [address]    Report Status PENDING  Incomplete  MRSA Next Gen by PCR, Nasal     Status: None   Collection Time: 01/20/21  5:31 AM   Specimen: Nasal Mucosa; Nasal Swab  Result Value Ref Range Status   MRSA by PCR Next Gen NOT DETECTED NOT DETECTED Final    Comment: (NOTE) The GeneXpert MRSA Assay (FDA approved for NASAL specimens only), is one component of a comprehensive MRSA colonization surveillance program. It is not intended to diagnose MRSA infection nor to guide or monitor treatment for MRSA infections. Test performance is not FDA approved in patients less than 59  years old. Performed at [facility], [place] [address], [place], [place] [address]          Radiology Studies: ECHOCARDIOGRAM COMPLETE  Result Date: 01/19/2021    ECHOCARDIOGRAM REPORT   Patient Name:   Belinda [name] Date of Exam: 01/19/2021 Medical Rec #:  [id number]        Height:       60.0 in Accession #:    [id number]       Weight:       150.8 lb Date of Birth:  1950-09-12       BSA:          1.656 m Patient Age:    58 years         BP:           152/71 mmHg Patient Gender: F                HR:           88 bpm. Exam Location:  Inpatient Procedure: 2D Echo, Cardiac Doppler and Color Doppler Indications:    Bacteremia  History:        Patient has prior history of Echocardiogram examinations, most  recent 10/15/2020. CHF, Signs/Symptoms:ESRD, anemia, ERSD; Risk                 Factors:Diabetes, Hypertension and Dyslipidemia.  Sonographer:    Dustin Flock Referring Phys: [id number] [place]  1. Left ventricular ejection fraction, by estimation, is 55%. The left ventricle has normal function. The left ventricle has no regional wall motion abnormalities. There is mild left ventricular hypertrophy. Left ventricular diastolic parameters are consistent with Grade I diastolic dysfunction (impaired relaxation).  2. Peak RV-RA gradient 22 mmHg. Right ventricular systolic function is mildly reduced. The right ventricular size is normal.  3. The mitral valve is normal in structure. Trivial mitral valve regurgitation. No evidence of mitral stenosis.  4. The aortic valve is tricuspid. Aortic valve regurgitation is not visualized. No aortic stenosis is present.  5. The IVC was not visualized.  6. Technically difficult study with poor acoustic windows. FINDINGS  Left Ventricle: Left ventricular ejection fraction, by estimation, is 55%. The left ventricle has normal function. The left ventricle has no regional wall motion abnormalities. The left ventricular internal cavity  size was normal in size. There is mild left ventricular hypertrophy. Left ventricular diastolic parameters are consistent with Grade I diastolic dysfunction (impaired relaxation). Right Ventricle: Peak RV-RA gradient 22 mmHg. The right ventricular size is normal. No increase in right ventricular wall thickness. Right ventricular systolic function is mildly reduced. Left Atrium: Left atrial size was normal in size. Right Atrium: Right atrial size was normal in size. Pericardium: There is no evidence of pericardial effusion. Mitral Valve: The mitral valve is normal in structure. Trivial mitral valve regurgitation. No evidence of mitral valve stenosis. Tricuspid Valve: The tricuspid valve is normal in structure. Tricuspid valve regurgitation is trivial. Aortic Valve: The aortic valve is tricuspid. Aortic valve regurgitation is not visualized. No aortic stenosis is present. Aortic valve peak gradient measures 10.8 mmHg. Pulmonic Valve: The pulmonic valve was not well visualized. Pulmonic valve regurgitation is not visualized. Aorta: The aortic root is normal in size and structure. Venous: The inferior vena cava was not well visualized. IAS/Shunts: No atrial level shunt detected by color flow Doppler.  LEFT VENTRICLE PLAX 2D LVIDd:         3.80 cm  Diastology LVIDs:         2.90 cm  LV e' medial:    3.26 cm/s LV PW:         1.00 cm  LV E/e' medial:  29.3 LV IVS:        1.30 cm  LV e' lateral:   4.68 cm/s LVOT diam:     2.00 cm  LV E/e' lateral: 20.4 LV SV:         71 LV SV Index:   43 LVOT Area:     3.14 cm  RIGHT VENTRICLE RV Basal diam:  3.30 cm RV S prime:     9.90 cm/s TAPSE (M-mode): 2.3 cm LEFT ATRIUM           Index       RIGHT ATRIUM           Index LA diam:      4.00 cm 2.42 cm/m  RA Area:     16.90 cm LA Vol (A4C): 55.4 ml 33.46 ml/m RA Volume:   41.10 ml  24.83 ml/m  AORTIC VALVE AV Area (Vmax): 2.16 cm AV Vmax:        164.00 cm/s AV Peak Grad:   10.8 mmHg LVOT Vmax:  113.00 cm/s LVOT Vmean:      77.300 cm/s LVOT VTI:       0.225 m  AORTA Ao Root diam: 3.00 cm MITRAL VALVE                TRICUSPID VALVE MV Area (PHT): 6.32 cm     TR Peak grad:   22.1 mmHg MV Decel Time: 120 msec     TR Vmax:        235.00 cm/s MV E velocity: 95.40 cm/s MV A velocity: 136.00 cm/s  SHUNTS MV E/A ratio:  0.70         Systemic VTI:  0.22 m                             Systemic Diam: 2.00 cm Loralie Champagne MD Electronically signed by Loralie Champagne MD Signature Date/Time: 01/19/2021/5:51:51 PM    Final         Scheduled Meds:  aspirin EC  81 mg Oral Once per day on Mon Wed Fri   Chlorhexidine Gluconate Cloth  6 each Topical Daily   simvastatin  40 mg Oral QHS   And   ezetimibe  10 mg Oral QHS   heparin injection (subcutaneous)  5,000 Units Subcutaneous Q8H   insulin aspart  0-9 Units Subcutaneous TID WC   insulin glargine  10 Units Subcutaneous Daily   losartan  12.5 mg Oral Daily   midodrine  10 mg Oral Once per day on Tue Thu Sat   mometasone-formoterol  2 puff Inhalation BID   Continuous Infusions:  [START ON 01/21/2021] sodium chloride     [START ON 01/21/2021] sodium chloride     vancomycin       LOS: 0 days    Time spent: 35 minutes    Geradine Girt, DO [facility]  If 7PM-7AM, please contact night-coverage [email or web address] 01/20/2021, 12:19 PM

## 2021-01-20 NOTE — Progress Notes (Signed)
Hemodialysis- Tolerated well without issue. UF 2.1L removed as ordered. PLEASE SEE TREATMENT SHEET ON CHART. Treatment performed by Gilford Raid RN.

## 2021-01-21 DIAGNOSIS — R7881 Bacteremia: Secondary | ICD-10-CM

## 2021-01-21 DIAGNOSIS — A419 Sepsis, unspecified organism: Secondary | ICD-10-CM | POA: Diagnosis not present

## 2021-01-21 DIAGNOSIS — M546 Pain in thoracic spine: Secondary | ICD-10-CM | POA: Diagnosis not present

## 2021-01-21 DIAGNOSIS — N186 End stage renal disease: Secondary | ICD-10-CM | POA: Diagnosis not present

## 2021-01-21 LAB — GLUCOSE, CAPILLARY
Glucose-Capillary: 280 mg/dL — ABNORMAL HIGH (ref 70–99)
Glucose-Capillary: 277 mg/dL — ABNORMAL HIGH (ref 70–99)
Glucose-Capillary: 345 mg/dL — ABNORMAL HIGH (ref 70–99)
Glucose-Capillary: 360 mg/dL — ABNORMAL HIGH (ref 70–99)

## 2021-01-21 MED ORDER — SORBITOL 70 % SOLN
30.0000 mL | Freq: Every day | Status: DC | PRN
  Filled 2021-01-21: qty 30

## 2021-01-21 MED ORDER — CHLORHEXIDINE GLUCONATE CLOTH 2 % EX PADS
6.0000 | MEDICATED_PAD | Freq: Every day | CUTANEOUS | Status: DC
Start: 2021-01-22 — End: 2021-01-21

## 2021-01-21 MED ORDER — DARBEPOETIN ALFA 60 MCG/0.3ML IJ SOSY: 60.0000 ug | PREFILLED_SYRINGE | INTRAMUSCULAR | Status: DC

## 2021-01-21 MED ORDER — LIDOCAINE 5 % EX PTCH
1.0000 | MEDICATED_PATCH | CUTANEOUS | Status: DC
  Administered 2021-01-21 – 2021-01-23 (×3): 1 via TRANSDERMAL
  Filled 2021-01-21 (×3): qty 1

## 2021-01-21 NOTE — H&P (View-Only) (Signed)
    [facility] has been requested to perform a transesophageal echocardiogram on 01/22/21 for bacteremia.  After careful review of history and examination, the risks and benefits of transesophageal echocardiogram have been explained including risks of esophageal damage, perforation (1:10,000 risk), bleeding, pharyngeal hematoma as well as other potential complications associated with conscious sedation including aspiration, arrhythmia, respiratory failure and death. Alternatives to treatment were discussed, questions were answered. Patient is willing to proceed.   TEE scheduled for 01/22/21 at 2pm with Dr. Oval Linsey.  Roby Lofts, PA-C 01/21/2021 5:15 PM

## 2021-01-21 NOTE — Progress Notes (Signed)
[facility] Progress Note   Subjective:   Patient seen and examined in room.  Reports improvement in breathing following dialysis but continues to require O2.  Biggest complaint is ongoing back pain.  Denies CP, n/v/d, abdominal pain, orthopnea and edema.    Objective Vitals:   01/20/21 2041 01/20/21 2354 01/21/21 0608 01/21/21 0803  BP:  (!) 151/72 139/69 134/63  Pulse:  97 98 93  Resp:  (!) 25 18 20   Temp:  98.4 F (36.9 C) 97.7 F (36.5 C) 98 F (36.7 C)  TempSrc:  Oral Oral Oral  SpO2: 98% 99% 98% 97%  Weight:      Height:       Physical Exam General:well appearing female in NAD Heart:RRR, no mrg Lungs:mostly CTAB, nml WOB on 2L via [place] Abdomen:soft, NTND Extremities:no LE edema Dialysis Access: R thigh AVF, no erythema/edema or tenderness   Filed Weights   01/20/21 0535 01/20/21 1616  Weight: 69.9 kg 69.9 kg    Intake/Output Summary (Last 24 hours) at 01/21/2021 1152 Last data filed at 01/21/2021 0900 Gross per 24 hour  Intake 620 ml  Output --  Net 620 ml    Additional Objective Labs: Basic Metabolic Panel: Recent Labs  Lab 01/18/21 0545 01/19/21 0046 01/20/21 0214  NA 133* 129* 129*  K 3.9 3.8 4.0  CL 89* 88* 87*  CO2 28 26 25   GLUCOSE 100* 346* 310*  BUN 38* 53* 64*  CREATININE 7.41* 8.83* 10.62*  CALCIUM 8.7* 8.5* 8.4*   Liver Function Tests: Recent Labs  Lab 01/17/21 1805 01/18/21 0545 01/20/21 0214  AST 41 31 20  ALT 67* 46* 30  ALKPHOS 242* 196* 203*  BILITOT 2.1* 1.8* 1.1  PROT 7.1 5.6* 5.6*  ALBUMIN 3.3* 2.6* 2.2*    CBC: Recent Labs  Lab 01/17/21 1805 01/18/21 0545 01/19/21 0046 01/20/21 0214  WBC 9.4 8.0 8.7 14.0*  NEUTROABS 7.1 5.2  --   --   HGB 11.0* 10.0* 8.9* 9.5*  HCT 31.1* 29.2* 25.0* 26.5*  MCV 82.7 84.6 81.4 80.1  PLT PLATELET CLUMPS NOTED ON SMEAR, UNABLE TO ESTIMATE 79* 88* 141*   Blood Culture    Component Value Date/Time   SDES BLOOD LEFT HAND 01/19/2021 0858   SPECREQUEST  01/19/2021  0858    BOTTLES DRAWN AEROBIC ONLY Blood Culture results may not be optimal due to an inadequate volume of blood received in culture bottles   CULT  01/19/2021 0858    NO GROWTH 1 DAY Performed at [facility], [address] [place], [place] [address]    REPTSTATUS PENDING 01/19/2021 0858    CBG: Recent Labs  Lab 01/20/21 0749 01/20/21 1201 01/20/21 1659 01/20/21 2106 01/21/21 0811  GLUCAP 283* 292* 149* 256* 280*    Studies/Results: MR THORACIC SPINE WO CONTRAST  Result Date: 01/21/2021 CLINICAL DATA:  Initial evaluation for worsening mid back pain, history of bacteremia. EXAM: MRI THORACIC SPINE WITHOUT CONTRAST TECHNIQUE: Multiplanar, multisequence MR imaging of the thoracic spine was performed. No intravenous contrast was administered. COMPARISON:  None available. FINDINGS: Alignment: Dextroscoliosis with mild exaggeration of the normal thoracic kyphosis. No significant listhesis. Vertebrae: Susceptibility artifact from prior ACDF noted at C3-C7 on counter sequence. Vertebral body height maintained without acute or chronic fracture. Bone marrow signal intensity is diffusely decreased and heterogeneous on T1 weighted sequence, most likely related to history of end-stage renal disease and anemia. Extensive discogenic reactive endplate change present throughout the midthoracic spine extending from T6-7 through  T11-12, favored to be degenerative in nature due to underlying scoliotic curvature. Associated marrow edema seen within the endplates adjacent to the T8-9 interspace, and to a lesser extent the T1-2, T6-7 and T7-8 interspaces. No definite edema within the intervening discs themselves. Findings are favored to be discogenic and reactive in nature, although changes of early osteomyelitis discitis are difficult to exclude, and could be considered in the correct clinical setting. No other evidence for acute infection within the thoracic spine. No worrisome osseous lesions. Cord:  Patchy signal abnormality seen involving the cervical spinal cord at approximately the level of C3-4, likely reflecting chronic myelomalacia (series 15, image 16). Signal intensity within the visualized cord is otherwise within normal limits. Conus terminates at approximately the L1 level. No epidural collections. Paraspinal and other soft tissues: Paraspinous soft tissues demonstrate no acute finding. No definite paraspinous inflammatory changes seen about the T6-7 through T8-9 interspaces. No soft tissue collections. Small layering bilateral pleural effusions, left slightly larger than right. Disc levels: T1-2: Diffuse disc bulge with reactive endplate change, with left greater than right facet hypertrophy. No spinal stenosis. Mild to moderate bilateral foraminal narrowing. T2-3: Mild disc bulge with facet hypertrophy. No spinal stenosis. Foramina remain patent. T3-4: Small central disc extrusion with inferior migration. Mild facet hypertrophy. No significant spinal stenosis. Foramina remain patent. T4-5: Small right subarticular disc protrusion minimally indents the right ventral thecal sac. Associated mild reactive endplate spurring with bilateral facet hypertrophy. No spinal stenosis. Foramina remain patent. T5-6: Disc desiccation without significant disc bulge. Right greater than left facet hypertrophy. No spinal stenosis. Mild right foraminal narrowing. T6-7: Degenerative intervertebral disc space narrowing with diffuse disc osteophyte complex, eccentric to the left. Mild reactive marrow edema favored to be degenerative. Mild flattening of the left ventral cord without cord signal changes or significant spinal stenosis. Mild bilateral foraminal narrowing. T7-8: Advanced degenerative intervertebral disc space narrowing with diffuse disc osteophyte complex. Mild reactive marrow edema favored to be degenerative. Bilateral facet hypertrophy. Resultant mild spinal stenosis without cord impingement. Moderate right  with mild left foraminal narrowing. T8-9: Advanced degenerative intervertebral disc space narrowing with diffuse disc osteophyte complex. Bilateral facet hypertrophy. Resultant mild spinal stenosis without cord impingement. Moderate bilateral foraminal narrowing. T9-10: Advanced degenerative intervertebral disc space narrowing with essentially ankylosis of the T9 and T10 vertebral bodies. Posterior osseous ridging flattens and partially faces the ventral thecal sac. Bilateral facet hypertrophy. Resultant mild spinal stenosis without frank cord impingement. Mild right worse than left foraminal narrowing. T10-11: Advanced degenerative intervertebral disc space narrowing with ankylosis. Posterior endplate osseous ridging with flattening of the ventral thecal sac, eccentric to the left. Left worse than right facet degeneration. Mild spinal stenosis without cord impingement. Mild to moderate bilateral foraminal narrowing. T11-12: Advanced degenerative intervertebral disc space narrowing with ankylosis. Bilateral facet hypertrophy with ankylosis as well. No significant spinal stenosis. Mild to moderate right with mild left foraminal narrowing. T12-L1:  Negative interspace.  Mild facet hypertrophy.  No stenosis. IMPRESSION: 1. 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 Resultant mild diffuse spinal stenosis at T6-7 through T10-11, with associated mild to moderate multilevel foraminal narrowing as detailed above. 2. Mild marrow edema involving the endplates at E9-5 through T8-9, favored to be degenerative in nature. Possible changes of early osteomyelitis discitis are difficult to exclude, although are felt to be unlikely. If patient's symptoms should persist and/or worsen, a short interval follow-up MRI to evaluate for interval change may be  helpful for further evaluation as warranted. No other evidence for acute infection  within the thoracic spine. 3. Patchy signal abnormality involving the cervical spinal cord at the level of C3-4, most consistent with chronic myelomalacia. 4. Small layering bilateral pleural effusions, left greater than right. Electronically Signed   By: Jeannine Boga M.D.   On: 01/21/2021 00:42   ECHOCARDIOGRAM COMPLETE  Result Date: 01/19/2021    ECHOCARDIOGRAM REPORT   Patient Name:   KLAIRA PESCI Date of Exam: 01/19/2021 Medical Rec #:  [id number]        Height:       60.0 in Accession #:    [id number]       Weight:       150.8 lb Date of Birth:  02/03/1951       BSA:          1.656 m Patient Age:    25 years         BP:           152/71 mmHg Patient Gender: F                HR:           88 bpm. Exam Location:  Inpatient Procedure: 2D Echo, Cardiac Doppler and Color Doppler Indications:    Bacteremia  History:        Patient has prior history of Echocardiogram examinations, most                 recent 10/15/2020. CHF, Signs/Symptoms:ESRD, anemia, ERSD; Risk                 Factors:Diabetes, Hypertension and Dyslipidemia.  Sonographer:    Dustin Flock Referring Phys: [id number] [place]  1. Left ventricular ejection fraction, by estimation, is 55%. The left ventricle has normal function. The left ventricle has no regional wall motion abnormalities. There is mild left ventricular hypertrophy. Left ventricular diastolic parameters are consistent with Grade I diastolic dysfunction (impaired relaxation).  2. Peak RV-RA gradient 22 mmHg. Right ventricular systolic function is mildly reduced. The right ventricular size is normal.  3. The mitral valve is normal in structure. Trivial mitral valve regurgitation. No evidence of mitral stenosis.  4. The aortic valve is tricuspid. Aortic valve regurgitation is not visualized. No aortic stenosis is present.  5. The IVC was not visualized.  6. Technically difficult study with poor acoustic windows. FINDINGS  Left Ventricle: Left ventricular  ejection fraction, by estimation, is 55%. The left ventricle has normal function. The left ventricle has no regional wall motion abnormalities. The left ventricular internal cavity size was normal in size. There is mild left ventricular hypertrophy. Left ventricular diastolic parameters are consistent with Grade I diastolic dysfunction (impaired relaxation). Right Ventricle: Peak RV-RA gradient 22 mmHg. The right ventricular size is normal. No increase in right ventricular wall thickness. Right ventricular systolic function is mildly reduced. Left Atrium: Left atrial size was normal in size. Right Atrium: Right atrial size was normal in size. Pericardium: There is no evidence of pericardial effusion. Mitral Valve: The mitral valve is normal in structure. Trivial mitral valve regurgitation. No evidence of mitral valve stenosis. Tricuspid Valve: The tricuspid valve is normal in structure. Tricuspid valve regurgitation is trivial. Aortic Valve: The aortic valve is tricuspid. Aortic valve regurgitation is not visualized. No aortic stenosis is present. Aortic valve peak gradient measures 10.8 mmHg. Pulmonic Valve: The pulmonic valve was not well visualized. Pulmonic valve regurgitation is not visualized.  Aorta: The aortic root is normal in size and structure. Venous: The inferior vena cava was not well visualized. IAS/Shunts: No atrial level shunt detected by color flow Doppler.  LEFT VENTRICLE PLAX 2D LVIDd:         3.80 cm  Diastology LVIDs:         2.90 cm  LV e' medial:    3.26 cm/s LV PW:         1.00 cm  LV E/e' medial:  29.3 LV IVS:        1.30 cm  LV e' lateral:   4.68 cm/s LVOT diam:     2.00 cm  LV E/e' lateral: 20.4 LV SV:         71 LV SV Index:   43 LVOT Area:     3.14 cm  RIGHT VENTRICLE RV Basal diam:  3.30 cm RV S prime:     9.90 cm/s TAPSE (M-mode): 2.3 cm LEFT ATRIUM           Index       RIGHT ATRIUM           Index LA diam:      4.00 cm 2.42 cm/m  RA Area:     16.90 cm LA Vol (A4C): 55.4 ml 33.46  ml/m RA Volume:   41.10 ml  24.83 ml/m  AORTIC VALVE AV Area (Vmax): 2.16 cm AV Vmax:        164.00 cm/s AV Peak Grad:   10.8 mmHg LVOT Vmax:      113.00 cm/s LVOT Vmean:     77.300 cm/s LVOT VTI:       0.225 m  AORTA Ao Root diam: 3.00 cm MITRAL VALVE                TRICUSPID VALVE MV Area (PHT): 6.32 cm     TR Peak grad:   22.1 mmHg MV Decel Time: 120 msec     TR Vmax:        235.00 cm/s MV E velocity: 95.40 cm/s MV A velocity: 136.00 cm/s  SHUNTS MV E/A ratio:  0.70         Systemic VTI:  0.22 m                             Systemic Diam: 2.00 cm Loralie Champagne MD Electronically signed by Loralie Champagne MD Signature Date/Time: 01/19/2021/5:51:51 PM    Final     Medications:  vancomycin 750 mg (01/20/21 1454)    aspirin EC  81 mg Oral Once per day on Mon Wed Fri   Chlorhexidine Gluconate Cloth  6 each Topical Daily   simvastatin  40 mg Oral QHS   And   ezetimibe  10 mg Oral QHS   heparin injection (subcutaneous)  5,000 Units Subcutaneous Q8H   insulin aspart  0-9 Units Subcutaneous TID WC   insulin glargine  14 Units Subcutaneous Daily   lidocaine  1 patch Transdermal Q24H   LORazepam  0.5 mg Intravenous Once   losartan  12.5 mg Oral Daily   midodrine  10 mg Oral Once per day on Tue Thu Sat   mometasone-formoterol  2 puff Inhalation BID    Dialysis Orders: [facility] TTS 4h 400/1.5A EDW 67 kg 2K/2Ca UFP 4 AVG Heparin 4000 Mircera 60 q 2wks Calcitriol 1.5 TIW    Assessment/Plan: Sepsis 2/2 Granulicatella bacteremia. -Per primary. Thigh AVG w/o signs of infection. ID  consulted, on vancomycin. TEE planned for tomorrow. MRI thoracic spine with degenerative changes, "possible changes of early osteomyelitis discitis are difficult to exclude, although thought to be unlikely."  Repeat BC with NGTD Hypoxia - on 2L O2.  CXR with hypoinflation of the lungs and atelectasis, CT chest with 8 mm ground-glass opacity seen in the left lung apex, infection vs inflammation vs neoplasm. Slightly improved post HD  per patient, will continue to challenge dry weight. Per PMD ESRD -  HD TTS. HD tomorrow per regular schedule.  Hypertension/volume  - BP controlled. On midodrine with HD. Has been getting below dry weight. Continue to challenge dry weight, get standing weights if able.  Anemia  - Hgb 9.5, On ESA. Next due 7/28 - ordered.  Metabolic bone disease -  Ca ok. Will check phos. Continue VDRA and binders. DMT2 Chronic systolic HF  Jen Mow, PA-C [place] 01/21/2021,11:52 AM  LOS: 1 day

## 2021-01-21 NOTE — Progress Notes (Signed)
[facility] Infect[facility]  Date of Admission:  6/33/3545     Cc: granulicatella bsi                                     Referring Provider: Eliseo Squires     Lines:  Peripheral iv's Right LE avgraft   Abx: 7/24-c vanc   7/24-25 cefepime/flagyl                                                               Assessment: Granulicatella adiacens bsi Esrd Chf   70 yo female dm2, left TKA h x, esrd on iHD via RLE AV graft, chronic cervical/lumbar back pain admitted from dialysis center for sepsis and hypoxemic respiratory distress found to have granulicatella bsi in setting of 1 week malaise 2 weeks after a dental procedure     Clinicaly presentation concerning for IE. Granulicatella is a nutrition-variant strep that often requires treatment like enterococcus endocarditis in terms of beta-lactam aminoglycoside. It is often susceptible to vanc and less so with ceftriaxone. Sensitivity testing is difficult   Has n/v/diarrhea ?related to sepsis/missing dialysis. Doubt other viral process/tick process. Thrombocytopenia and mild lft probably due to sepsis    She dialyzed via chronic right LE avg. The graft doesn't appear infected at this time She complained of the last 3 weeks very severe thoracic pain as well, will get mri imaging to r/o infection involvement Her left tka is without acute changes concerning for infection   --------- 7/27 assessment Repeat bcx 7/25 negative 7/25 tte poor quality. Awaiting tee Mri thoracic spine no abscess; t6-9 ?early OM changes        Plan: Continue vanc Await tee planned 7/28 F/u repeat bcx Discussed with primary team  I spent more than 35 minute reviewing data/chart, and coordinating care and >50% direct face to face time providing counseling/discussing diagnostics/treatment plan with patient   Principal Problem:   Sepsis due to undetermined organism [facility]) Active Problems:   Essential hypertension   Chronic systolic  (congestive) heart failure (HCC)   DM (diabetes mellitus), type 2 with renal complications ([place])   ESRD on hemodialysis ([place])   Anemia due to end stage renal disease (HCC)   Abnormal liver function tests   Hypoxia   Bacteremia   Allergies  Allergen Reactions   Doxycycline Other (See Comments)   Entresto [Sacubitril-Valsartan] Itching   Metformin Other (See Comments)    weakness   Omnicef [Cefdinir] Other (See Comments)    GI Upset   Aspirin Other (See Comments)    GI issues, Can tolerate low aspirin   Augmentin [Amoxicillin-Pot Clavulanate] Nausea And Vomiting and Other (See Comments)    GI issues DID THE REACTION INVOLVE: Swelling of the face/tongue/throat, SOB, or low BP? No Sudden or severe rash/hives, skin peeling, or the inside of the mouth or nose? No Did it require medical treatment? No When did it last happen?      long time  If all above answers are "NO", may proceed with cephalosporin use.    Erythromycin Nausea And Vomiting and Other (See Comments)    Gi issues   Gabapentin Swelling   Nickel Itching  and Rash    Breakouts     Scheduled Meds:  aspirin EC  81 mg Oral Once per day on Mon Wed Fri   Chlorhexidine Gluconate Cloth  6 each Topical Daily   [START ON 01/22/2021] darbepoetin (ARANESP) injection - DIALYSIS  60 mcg Intravenous Q Thu-HD   simvastatin  40 mg Oral QHS   And   ezetimibe  10 mg Oral QHS   heparin injection (subcutaneous)  5,000 Units Subcutaneous Q8H   insulin aspart  0-9 Units Subcutaneous TID WC   insulin glargine  14 Units Subcutaneous Daily   lidocaine  1 patch Transdermal Q24H   LORazepam  0.5 mg Intravenous Once   losartan  12.5 mg Oral Daily   midodrine  10 mg Oral Once per day on Tue Thu Sat   mometasone-formoterol  2 puff Inhalation BID   Continuous Infusions:  vancomycin 750 mg (01/20/21 1454)   PRN Meds:.acetaminophen **OR** acetaminophen, ALPRAZolam, diphenhydrAMINE, guaiFENesin, ondansetron **OR** ondansetron (ZOFRAN) IV,  sorbitol   SUBJECTIVE: No diarrhea No rash Back pain moderate-severe No LE deficit in neurologic function Afebrile Pendign tee  Reviewed mri result with patient   Review of Systems: ROS All other ROS was negative, except mentioned above     OBJECTIVE: Vitals:   01/20/21 2041 01/20/21 2354 01/21/21 0608 01/21/21 0803  BP:  (!) 151/72 139/69 134/63  Pulse:  97 98 93  Resp:  (!) 25 18 20   Temp:  98.4 F (36.9 C) 97.7 F (36.5 C) 98 F (36.7 C)  TempSrc:  Oral Oral Oral  SpO2: 98% 99% 98% 97%  Weight:      Height:       Body mass index is 30.1 kg/m.  Physical Exam General/constitutional: no distress, pleasant HEENT: Normocephalic, PER, Conj Clear, EOMI, Oropharynx clear Neck supple CV: rrr no mrg Lungs: clear to auscultation, normal respiratory effort Abd: Soft, Nontender Ext: no edema Skin: No Rash Neuro: nonfocal   Central line presence: no; old right thigh avg   Lab Results Lab Results  Component Value Date   WBC 14.0 (H) 01/20/2021   HGB 9.5 (L) 01/20/2021   HCT 26.5 (L) 01/20/2021   MCV 80.1 01/20/2021   PLT 141 (L) 01/20/2021    Lab Results  Component Value Date   CREATININE 10.62 (H) 01/20/2021   BUN 64 (H) 01/20/2021   NA 129 (L) 01/20/2021   K 4.0 01/20/2021   CL 87 (L) 01/20/2021   CO2 25 01/20/2021    Lab Results  Component Value Date   ALT 30 01/20/2021   AST 20 01/20/2021   ALKPHOS 203 (H) 01/20/2021   BILITOT 1.1 01/20/2021      Microbiology: Recent Results (from the past 240 hour(s))  Resp Panel by RT-PCR (Flu A&B, Covid) Nasopharyngeal Swab     Status: None   Collection Time: 01/17/21  5:59 PM   Specimen: Nasopharyngeal Swab; Nasopharyngeal(NP) swabs in vial transport medium  Result Value Ref Range Status   SARS Coronavirus 2 by RT PCR NEGATIVE NEGATIVE Final    Comment: (NOTE) SARS-CoV-2 target nucleic acids are NOT DETECTED.  The SARS-CoV-2 RNA is generally detectable in upper respiratory specimens during the  acute phase of infection. The lowest concentration of SARS-CoV-2 viral copies this assay can detect is 138 copies/mL. A negative result does not preclude SARS-Cov-2 infection and should not be used as the sole basis for treatment or other patient management decisions. A negative result may occur with  improper specimen collection/handling, submission of  specimen other than nasopharyngeal swab, presence of viral mutation(s) within the areas targeted by this assay, and inadequate number of viral copies(<138 copies/mL). A negative result must be combined with clinical observations, patient history, and epidemiological information. The expected result is Negative.  Fact Sheet for Patients:  EntrepreneurPulse.com.au  Fact Sheet for Healthcare Providers:  IncredibleEmployment.be  This test is no t yet approved or cleared by the [place] FDA and  has been authorized for detection and/or diagnosis of SARS-CoV-2 by FDA under an Emergency Use Authorization (EUA). This EUA will remain  in effect (meaning this test can be used) for the duration of the COVID-19 declaration under Section 564(b)(1) of the Act, 21 U.S.C.section 360bbb-3(b)(1), unless the authorization is terminated  or revoked sooner.       Influenza A by PCR NEGATIVE NEGATIVE Final   Influenza B by PCR NEGATIVE NEGATIVE Final    Comment: (NOTE) The Xpert Xpress SARS-CoV-2/FLU/RSV plus assay is intended as an aid in the diagnosis of influenza from Nasopharyngeal swab specimens and should not be used as a sole basis for treatment. Nasal washings and aspirates are unacceptable for Xpert Xpress SARS-CoV-2/FLU/RSV testing.  Fact Sheet for Patients: EntrepreneurPulse.com.au  Fact Sheet for Healthcare Providers: IncredibleEmployment.be  This test is not yet approved or cleared by the [place] FDA and has been authorized for detection and/or  diagnosis of SARS-CoV-2 by FDA under an Emergency Use Authorization (EUA). This EUA will remain in effect (meaning this test can be used) for the duration of the COVID-19 declaration under Section 564(b)(1) of the Act, 21 U.S.C. section 360bbb-3(b)(1), unless the authorization is terminated or revoked.  Performed at [facility], [place] [address] [place], [place] [address]   Blood culture (routine x 2)     Status: Abnormal (Preliminary result)   Collection Time: 01/17/21  8:46 PM   Specimen: BLOOD LEFT HAND  Result Value Ref Range Status   Specimen Description BLOOD LEFT HAND  Final   Special Requests   Final    BOTTLES DRAWN AEROBIC AND ANAEROBIC Blood Culture results may not be optimal due to an inadequate volume of blood received in culture bottles   Culture  Setup Time   Final    GRAM POSITIVE COCCI IN PAIRS AND CHAINS IN BOTH AEROBIC AND ANAEROBIC BOTTLES CRITICAL RESULT CALLED TO, READ BACK BY AND VERIFIED WITH: PHARM D J.ORIET ON [id number] AT 7106 BY E.PARRISH    Culture (A)  Final    GRANULICATELLA ADIACENS CULTURE REINCUBATED FOR BETTER GROWTH Performed at [facility], [place] [address], [place], [place] [address]    Report Status PENDING  Incomplete  Blood culture (routine x 2)     Status: Abnormal (Preliminary result)   Collection Time: 01/17/21  8:46 PM   Specimen: BLOOD  Result Value Ref Range Status   Specimen Description BLOOD LEFT ANTECUBITAL  Final   Special Requests   Final    BOTTLES DRAWN AEROBIC AND ANAEROBIC Blood Culture results may not be optimal due to an inadequate volume of blood received in culture bottles   Culture  Setup Time   Final    GRAM POSITIVE COCCI IN PAIRS AND CHAINS IN BOTH AEROBIC AND ANAEROBIC BOTTLES CRITICAL VALUE NOTED.  VALUE IS CONSISTENT WITH PREVIOUSLY REPORTED AND CALLED VALUE. Performed at [facility], [place] [address] [place], [place] [address]    Culture GRANULICATELLA ADIACENS (A)  Final   Report Status  PENDING  Incomplete  Blood Culture ID Panel (  Reflexed)     Status: None   Collection Time: 01/17/21  8:46 PM  Result Value Ref Range Status   Enterococcus faecalis NOT DETECTED NOT DETECTED Final   Enterococcus Faecium NOT DETECTED NOT DETECTED Final   Listeria monocytogenes NOT DETECTED NOT DETECTED Final   Staphylococcus species NOT DETECTED NOT DETECTED Final   Staphylococcus aureus (BCID) NOT DETECTED NOT DETECTED Final   Staphylococcus epidermidis NOT DETECTED NOT DETECTED Final   Staphylococcus lugdunensis NOT DETECTED NOT DETECTED Final   Streptococcus species NOT DETECTED NOT DETECTED Final   Streptococcus agalactiae NOT DETECTED NOT DETECTED Final   Streptococcus pneumoniae NOT DETECTED NOT DETECTED Final   Streptococcus pyogenes NOT DETECTED NOT DETECTED Final   A.calcoaceticus-baumannii NOT DETECTED NOT DETECTED Final   Bacteroides fragilis NOT DETECTED NOT DETECTED Final   Enterobacterales NOT DETECTED NOT DETECTED Final   Enterobacter cloacae complex NOT DETECTED NOT DETECTED Final   Escherichia coli NOT DETECTED NOT DETECTED Final   Klebsiella aerogenes NOT DETECTED NOT DETECTED Final   Klebsiella oxytoca NOT DETECTED NOT DETECTED Final   Klebsiella pneumoniae NOT DETECTED NOT DETECTED Final   Proteus species NOT DETECTED NOT DETECTED Final   Salmonella species NOT DETECTED NOT DETECTED Final   Serratia marcescens NOT DETECTED NOT DETECTED Final   Haemophilus influenzae NOT DETECTED NOT DETECTED Final   Neisseria meningitidis NOT DETECTED NOT DETECTED Final   Pseudomonas aeruginosa NOT DETECTED NOT DETECTED Final   Stenotrophomonas maltophilia NOT DETECTED NOT DETECTED Final   Candida albicans NOT DETECTED NOT DETECTED Final   Candida auris NOT DETECTED NOT DETECTED Final   Candida glabrata NOT DETECTED NOT DETECTED Final   Candida krusei NOT DETECTED NOT DETECTED Final   Candida parapsilosis NOT DETECTED NOT DETECTED Final   Candida tropicalis NOT DETECTED NOT  DETECTED Final   Cryptococcus neoformans/gattii NOT DETECTED NOT DETECTED Final    Comment: Performed at [facility], [address] [place], [place] [address]  Culture, blood (routine x 2)     Status: None (Preliminary result)   Collection Time: 01/19/21  8:56 AM   Specimen: BLOOD LEFT HAND  Result Value Ref Range Status   Specimen Description BLOOD LEFT HAND  Final   Special Requests   Final    BOTTLES DRAWN AEROBIC ONLY Blood Culture results may not be optimal due to an inadequate volume of blood received in culture bottles   Culture   Final    NO GROWTH 2 DAYS Performed at [facility], [place] [address], [place], [place] [address]    Report Status PENDING  Incomplete  Culture, blood (routine x 2)     Status: None (Preliminary result)   Collection Time: 01/19/21  8:58 AM   Specimen: BLOOD LEFT HAND  Result Value Ref Range Status   Specimen Description BLOOD LEFT HAND  Final   Special Requests   Final    BOTTLES DRAWN AEROBIC ONLY Blood Culture results may not be optimal due to an inadequate volume of blood received in culture bottles   Culture   Final    NO GROWTH 2 DAYS Performed at [facility], [place] [address], [place], [place] [address]    Report Status PENDING  Incomplete  MRSA Next Gen by PCR, Nasal     Status: None   Collection Time: 01/20/21  5:31 AM   Specimen: Nasal Mucosa; Nasal Swab  Result Value Ref Range Status   MRSA by PCR Next Gen NOT DETECTED NOT DETECTED Final  Comment: (NOTE) The GeneXpert MRSA Assay (FDA approved for NASAL specimens only), is one component of a comprehensive MRSA colonization surveillance program. It is not intended to diagnose MRSA infection nor to guide or monitor treatment for MRSA infections. Test performance is not FDA approved in patients less than 6 years old. Performed at [facility], [place] [address], [place], [place] [address]      Serology:   Imaging: If present, new imagings (plain films, ct  scans, and mri) have been personally visualized and interpreted; radiology reports have been reviewed. Decision making incorporated into the Impression / Recommendations.  7/26 mri thoracic spine no contrast 1. 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 Resultant mild diffuse spinal stenosis at T6-7 through T10-11, with associated mild to moderate multilevel foraminal narrowing as detailed above. 2. Mild marrow edema involving the endplates at E1-7 through T8-9, favored to be degenerative in nature. Possible changes of early osteomyelitis discitis are difficult to exclude, although are felt to be unlikely. If patient's symptoms should persist and/or worsen, a short interval follow-up MRI to evaluate for interval change may be helpful for further evaluation as warranted. No other evidence for acute infection within the thoracic spine. 3. Patchy signal abnormality involving the cervical spinal cord at the level of C3-4, most consistent with chronic myelomalacia. 4. Small layering bilateral pleural effusions, left greater than right.  7/25 tte  1. Left ventricular ejection fraction, by estimation, is 55%. The left  ventricle has normal function. The left ventricle has no regional wall  motion abnormalities. There is mild left ventricular hypertrophy. Left  ventricular diastolic parameters are  consistent with Grade I diastolic dysfunction (impaired relaxation).   2. Peak RV-RA gradient 22 mmHg. Right ventricular systolic function is  mildly reduced. The right ventricular size is normal.   3. The mitral valve is normal in structure. Trivial mitral valve  regurgitation. No evidence of mitral stenosis.   4. The aortic valve is tricuspid. Aortic valve regurgitation is not  visualized. No aortic stenosis is present.   5. The IVC was not visualized.   6. Technically difficult study with poor  acoustic windows.    7/24 ct chest abd pelv with angio 1. No evidence of pulmonary artery embolism. 2. Cardiomegaly and small pericardial effusion. 3. Diffuse body wall edema could reflect early volume overload/anasarca. 4. Distended gallbladder with layering hyperdensity, could reflect tiny biliary stones/sand or vicarious extravasation of contrast if patient has had a recent contrasted exam. While there is no pericholecystic fluid or inflammation, could correlate for right upper quadrant symptoms as isolated distension can be an early sign of developing cholecystitis. No biliary ductal dilatation or intraductal gallstones. 5. Small amount of fluid surrounding the anastomosis of a right femoral AV fistula in the right inguinal chain. Could reflect small chronic postoperative seroma though should correlate fistula function and local assessment of the inguinal region. 6. Small broad-based umbilical hernia into which protrudes portion of the anti mesenteric transverse colon without evidence of vascular compromise or obstruction. 7. Diffuse osseous manifestations of renal osteodystrophy.   Jabier Mutton, [facility] [place] [phone] pager    01/21/2021, 12:50 PM

## 2021-01-21 NOTE — Progress Notes (Signed)
    [facility] has been requested to perform a transesophageal echocardiogram on 01/22/21 for bacteremia.  After careful review of history and examination, the risks and benefits of transesophageal echocardiogram have been explained including risks of esophageal damage, perforation (1:10,000 risk), bleeding, pharyngeal hematoma as well as other potential complications associated with conscious sedation including aspiration, arrhythmia, respiratory failure and death. Alternatives to treatment were discussed, questions were answered. Patient is willing to proceed.   TEE scheduled for 01/22/21 at 2pm with Dr. Oval Linsey.  Roby Lofts, PA-C 01/21/2021 5:15 PM

## 2021-01-21 NOTE — Progress Notes (Signed)
Pharmacy Antibiotic Note  Belinda [name] is a 70 y.o. female on day #5 Vancomycin for Granulicatella adiacens bloodstream infection. ID following. Awaiting sensitivities and TEE.    ESRD, Vancomycin given with HD on 7/26  Repeat blood cultures 7/25 negative to date.  Plan: Vancomycin 500 mg IV TTS after hemodialysis. Will follow HD schedule for any need to adjust timing of doses. Follow up sensitivities, TEE and antibiotic plans.  Height: 5' (152.4 cm) Weight: 69.9 kg (154 lb 1.6 oz) IBW/kg (Calculated) : 45.5  Temp (24hrs), Avg:98.1 F (36.7 C), Min:97.7 F (36.5 C), Max:98.4 F (36.9 C)  Recent Labs  Lab 01/17/21 1805 01/17/21 2239 01/18/21 0545 01/19/21 0046 01/20/21 0214  WBC 9.4  --  8.0 8.7 14.0*  CREATININE 6.43*  --  7.41* 8.83* 10.62*  LATICACIDVEN 2.3* 1.3  --   --   --      Allergies  Allergen Reactions   Doxycycline Other (See Comments)   Entresto [Sacubitril-Valsartan] Itching   Metformin Other (See Comments)    weakness   Omnicef [Cefdinir] Other (See Comments)    GI Upset   Aspirin Other (See Comments)    GI issues, Can tolerate low aspirin   Augmentin [Amoxicillin-Pot Clavulanate] Nausea And Vomiting and Other (See Comments)    GI issues DID THE REACTION INVOLVE: Swelling of the face/tongue/throat, SOB, or low BP? No Sudden or severe rash/hives, skin peeling, or the inside of the mouth or nose? No Did it require medical treatment? No When did it last happen?      long time  If all above answers are "NO", may proceed with cephalosporin use.    Erythromycin Nausea And Vomiting and Other (See Comments)    Gi issues   Gabapentin Swelling   Nickel Itching and Rash    Breakouts     Antimicrobials this admission: Vancomycin 7/23>> Cefepime 7/23>>7/25 Metronidazole 7/23>>7/25  Microbiology results: 7/23 Blood x 2: GPC in pairs and chains, nothing on BCID, maybe Strep looking on stain > now growing Granulicatella adiacens - reincubated 7/23  Urine: pending 7/25 blood x 2: no growth x 2 days to date 7/26 MRSA PCR: neg  Thank you for allowing pharmacy to be a part of this patient's care.  Arty Baumgartner. RPh 01/21/2021 2:58 PM

## 2021-01-21 NOTE — Evaluation (Signed)
Physical Therapy Evaluation Patient Details Name: Belinda [name] MRN: [id number] DOB: 1950/09/20 Today's Date: 01/21/2021   History of Present Illness  Pt is a 70 y/o female admitted 7/24 secondary to increased nausea and vomiting. Found to have Sepsis secondary to granulicatella adiacens. PMH includes ESRD on HD, DM, HTN, and CHF.  Clinical Impression  Pt admitted secondary to problem above with deficits below. Pt reporting increased back pain which limited mobility tolerance. Required mod A for bed mobility and to stand and min A to transfer to chair. Feel pt would benefit from SNF level therapies, however, pt reports she prefers to go home if possible. Reports husband is not able to physically assist but may have other family members that can assist. Will continue to follow acutely.     Follow Up Recommendations SNF (vs HHPT pending progression)    Equipment Recommendations  None recommended by PT    Recommendations for Other Services       Precautions / Restrictions Precautions Precautions: Fall Restrictions Weight Bearing Restrictions: No      Mobility  Bed Mobility Overal bed mobility: Needs Assistance Bed Mobility: Supine to Sit     Supine to sit: Mod assist     General bed mobility comments: mod A for LE and trunk assist. Increased time required.    Transfers Overall transfer level: Needs assistance Equipment used: 1 person hand held assist Transfers: Sit to/from [facility] Sit to Stand: Mod assist Stand pivot transfers: Min assist       General transfer comment: Mod A for lift assist to stand. Min A for steadying assist to transfer to chair. Pt reporting increased back pain, so further mobility deferred. PT stood in front of pt and had pt to hold to PT arms.  Ambulation/Gait                Stairs            Wheelchair Mobility    Modified Rankin (Stroke Patients Only)       Balance Overall balance assessment: Needs  assistance Sitting-balance support: No upper extremity supported;Feet supported Sitting balance-Leahy Scale: Fair     Standing balance support: Bilateral upper extremity supported;During functional activity Standing balance-Leahy Scale: Poor Standing balance comment: Reliant on UE and external support                             Pertinent Vitals/Pain Pain Assessment: Faces Faces Pain Scale: Hurts even more Pain Location: back Pain Descriptors / Indicators: Aching;Guarding;Grimacing Pain Intervention(s): Limited activity within patient's tolerance;Monitored during session;Repositioned    Home Living Family/patient expects to be discharged to:: Private residence Living Arrangements: Spouse/significant other Available Help at Discharge: Family Type of Home: House Home Access: Stairs to enter Entrance Stairs-Rails: None Entrance Stairs-Number of Steps: 1 Home Layout: One level Home Equipment: Environmental consultant - 2 wheels;Cane - single point;Bedside commode;Wheelchair - manual      Prior Function Level of Independence: Independent         Comments: Pt reports being independent with ADL and mobility tasks.     Hand Dominance        Extremity/Trunk Assessment   Upper Extremity Assessment Upper Extremity Assessment: Defer to OT evaluation    Lower Extremity Assessment Lower Extremity Assessment: Generalized weakness    Cervical / Trunk Assessment Cervical / Trunk Assessment: Kyphotic  Communication   Communication: No difficulties  Cognition Arousal/Alertness: Awake/alert Behavior During Therapy: WFL for tasks  assessed/performed Overall Cognitive Status: No family/caregiver present to determine baseline cognitive functioning                                        General Comments      Exercises     Assessment/Plan    PT Assessment Patient needs continued PT services  PT Problem List Decreased strength;Decreased activity  tolerance;Decreased balance;Decreased mobility;Decreased knowledge of use of DME;Decreased knowledge of precautions;Pain;Decreased safety awareness       PT Treatment Interventions DME instruction;Gait training;Stair training;Functional mobility training;Therapeutic exercise;Therapeutic activities;Balance training;Patient/family education    PT Goals (Current goals can be found in the Care Plan section)  Acute Rehab PT Goals Patient Stated Goal: to go home PT Goal Formulation: With patient Time For Goal Achievement: 02/04/21 Potential to Achieve Goals: Good    Frequency Min 3X/week   Barriers to discharge        Co-evaluation               AM-PAC PT "6 Clicks" Mobility  Outcome Measure Help needed turning from your back to your side while in a flat bed without using bedrails?: A Little Help needed moving from lying on your back to sitting on the side of a flat bed without using bedrails?: A Lot Help needed moving to and from a bed to a chair (including a wheelchair)?: A Little Help needed standing up from a chair using your arms (e.g., wheelchair or bedside chair)?: A Lot Help needed to walk in hospital room?: A Lot Help needed climbing 3-5 steps with a railing? : Total 6 Click Score: 13    End of Session Equipment Utilized During Treatment: Gait belt Activity Tolerance: Patient limited by fatigue;Patient limited by pain Patient left: in chair;with call bell/phone within reach;with chair alarm set Nurse Communication: Mobility status PT Visit Diagnosis: Unsteadiness on feet (R26.81);Muscle weakness (generalized) (M62.81);Difficulty in walking, not elsewhere classified (R26.2)    Time: 2229-7989 PT Time Calculation (min) (ACUTE ONLY): 23 min   Charges:   PT Evaluation $PT Eval Moderate Complexity: 1 Mod PT Treatments $Therapeutic Activity: 8-22 mins        Lou Miner, DPT  Acute Rehabilitation Services  Pager: [phone] Office: [phone]   Belinda [name] 01/21/2021, 2:00 PM

## 2021-01-21 NOTE — Progress Notes (Signed)
MD notified regarding elevated BP (173/69). No new orders, will continue to monitor.   Important to note that BP is being taken in L shin, due to RUA and R femoral HD accesses, as well as peripheral IV in LAC.

## 2021-01-21 NOTE — Progress Notes (Signed)
PROGRESS NOTE    [name]  TDV:[id number] DOB: Nov 02, 1950 DOA: 01/17/2021 PCP: Reynold Bowen, MD   Brief Narrative:   Belinda [name] is a 70 y.o. female with medical history significant of chronic systolic heart failure, ESRD anemia, ESRD on hemodialysis, anxiety/depression, chronic cervical and lumbar back pain, GERD, history of unspecified MRSA infection.   She states that she has been ill for about a week, had a sandwich from [place] [employer] [place] [employer] on Sunday then subsequently started to have multiple episodes of vomiting.  Then on Wednesday, finished her leftover sandwich, then continued to have multiple episodes of diarrhea through the day.  Due to feeling ill, she missed her dialysis on Tuesday and Thursday.  She was able to complete her session on Saturday, however, she continued to have worsening generalized weakness, had pulse ox with oxygen saturation in the high 80s, requiring nasal cannula O2.  She appeared to have some symptoms suggesting gastroenteritis, but is now noted to have gram-positive bacteremia with associated sepsis and is improving.  Nephrology plans for hemodialysis by 7/26. Discussed case with ID/cardiology who recommends TEE which cannot be done until 7/28.   Assessment & Plan:   Principal Problem:   Sepsis due to undetermined organism [facility]) Active Problems:   Essential hypertension   Chronic systolic (congestive) heart failure (HCC)   DM (diabetes mellitus), type 2 with renal complications (HCC)   ESRD on hemodialysis (HCC)   Anemia due to end stage renal disease (HCC)   Abnormal liver function tests   Hypoxia   Bacteremia   Sepsis present on admission secondary to granulicatella adiacens -Related to strep variant -2D echocardiogram: grade 1 diastolic -Patient will need TEE, and Cardiology states that earliest this could be done is 7/28 -ID consult appreciated -Continue vancomycin with no further need for cefepime or flagyl per ID -Right femoral  fistula site without signs of overt infection -worsening mid-back pain-- thoracic MRI: Extensive multilevel degenerative spondylosis and facet arthrosis throughout the thoracic spine. Mild marrow edema involving the endplates at G6-2 through T8-9, favored to be degenerative in nature. Possible changes of early osteomyelitis discitis are difficult to exclude, although are felt to be unlikely.  Hypoxemia -This appears to be mild and possibly related to some atelectasis -Trial of incentive spirometry and weaning of supplemental oxygen  Mild transaminitis -ultrasound without signs of acute cholecystitis  Hyponatremia -Per nephrology, plans for hemodialysis by 7/26  Essential hypertension -Currently stable -Resumed on losartan and midodrine as she was taking at home  Chronic systolic heart failure -No signs of volume overload or decompensation at this time -Hemodialysis per nephrology to resume on 7/26  Type 2 diabetes -Carb modified diet -Hemoglobin A1c 8.0% -SSI -increased long acting 7/27  ESRD on HD TTS -Nephrology consulted for hemodialysis  Anemia due to end-stage renal disease -Continue to monitor repeat CBC with no overt bleeding identified -Erythropoietin per nephrology   DVT prophylaxis: Heparin Code Status: Full Family Communication: Discussed with husband on phone 7/26 Disposition Plan:  Status is: inpt  The patient will require care spanning > 2 midnights and should be moved to inpatient because: IV treatments appropriate due to intensity of illness or inability to take PO and Inpatient level of care appropriate due to severity of illness  Dispo: The patient is from: Home              Anticipated d/c is to: Home              Patient currently is not  medically stable to d/c.- TEE planned for 7/28   Difficult to place patient No  Consultants:  Nephrology ID Dr. G[facility]   Antimicrobials:  Anti-infectives (From admission, onward)    Start      Dose/Rate Route Frequency Ordered Stop   01/20/21 1200  vancomycin (VANCOREADY) IVPB 750 mg/150 mL        750 mg 150 mL/hr over 60 Minutes Intravenous Every T-Th-Sa (Hemodialysis) 01/17/21 2106     01/18/21 2100  ceFEPIme (MAXIPIME) 1 g in sodium chloride 0.9 % 100 mL IVPB  Status:  Discontinued        1 g 200 mL/hr over 30 Minutes Intravenous Every 24 hours 01/17/21 2106 01/19/21 1532   01/18/21 0245  metroNIDAZOLE (FLAGYL) IVPB 500 mg  Status:  Discontinued       Note to Pharmacy: Hyperbilirubinemia with fever and suspicious finding on CT scan abdomen.   500 mg 100 mL/hr over 60 Minutes Intravenous Every 8 hours 01/18/21 0238 01/19/21 1532   01/17/21 2015  ceFEPIme (MAXIPIME) 2 g in sodium chloride 0.9 % 100 mL IVPB        2 g 200 mL/hr over 30 Minutes Intravenous  Once 01/17/21 2005 01/17/21 2055   01/17/21 2015  vancomycin (VANCOREADY) IVPB 1500 mg/300 mL        1,500 mg 150 mL/hr over 120 Minutes Intravenous  Once 01/17/21 2005 01/17/21 2252       Subjective: No BM since admission but is concerned about diarrhea   Objective: Vitals:   01/20/21 2041 01/20/21 2354 01/21/21 0608 01/21/21 0803  BP:  (!) 151/72 139/69 134/63  Pulse:  97 98 93  Resp:  (!) 25 18 20   Temp:  98.4 F (36.9 C) 97.7 F (36.5 C) 98 F (36.7 C)  TempSrc:  Oral Oral Oral  SpO2: 98% 99% 98% 97%  Weight:      Height:        Intake/Output Summary (Last 24 hours) at 01/21/2021 1114 Last data filed at 01/21/2021 0900 Gross per 24 hour  Intake 620 ml  Output --  Net 620 ml   Filed Weights   01/20/21 0535 01/20/21 1616  Weight: 69.9 kg 69.9 kg    Examination:   General: Appearance:    Obese female in no acute distress     Lungs:     respirations unlabored  Heart:    Normal heart rate.     MS:   All extremities are intact.    Neurologic:   Awake, alert, talkative        Data Reviewed: I have personally reviewed following labs and imaging studies  CBC: Recent Labs  Lab  01/17/21 1805 01/18/21 0545 01/19/21 0046 01/20/21 0214  WBC 9.4 8.0 8.7 14.0*  NEUTROABS 7.1 5.2  --   --   HGB 11.0* 10.0* 8.9* 9.5*  HCT 31.1* 29.2* 25.0* 26.5*  MCV 82.7 84.6 81.4 80.1  PLT PLATELET CLUMPS NOTED ON SMEAR, UNABLE TO ESTIMATE 79* 88* 950*   Basic Metabolic Panel: Recent Labs  Lab 01/17/21 1805 01/18/21 0545 01/19/21 0046 01/20/21 0214  NA 137 133* 129* 129*  K 4.1 3.9 3.8 4.0  CL 89* 89* 88* 87*  CO2 30 28 26 25   GLUCOSE 159* 100* 346* 310*  BUN 33* 38* 53* 64*  CREATININE 6.43* 7.41* 8.83* 10.62*  CALCIUM 8.9 8.7* 8.5* 8.4*  MG  --   --   --  2.2   GFR: Estimated Creatinine Clearance: 4.4 mL/min (A) (  by C-G formula based on SCr of 10.62 mg/dL (H)). Liver Function Tests: Recent Labs  Lab 01/17/21 1805 01/18/21 0545 01/20/21 0214  AST 41 31 20  ALT 67* 46* 30  ALKPHOS 242* 196* 203*  BILITOT 2.1* 1.8* 1.1  PROT 7.1 5.6* 5.6*  ALBUMIN 3.3* 2.6* 2.2*   No results for input(s): LIPASE, AMYLASE in the last 168 hours. No results for input(s): AMMONIA in the last 168 hours. Coagulation Profile: Recent Labs  Lab 01/17/21 1805  INR 1.0   Cardiac Enzymes: No results for input(s): CKTOTAL, CKMB, CKMBINDEX, TROPONINI in the last 168 hours. BNP (last 3 results) No results for input(s): PROBNP in the last 8760 hours. HbA1C: No results for input(s): HGBA1C in the last 72 hours.  CBG: Recent Labs  Lab 01/20/21 0749 01/20/21 1201 01/20/21 1659 01/20/21 2106 01/21/21 0811  GLUCAP 283* 292* 149* 256* 280*   Lipid Profile: No results for input(s): CHOL, HDL, LDLCALC, TRIG, CHOLHDL, LDLDIRECT in the last 72 hours. Thyroid Function Tests: No results for input(s): TSH, T4TOTAL, FREET4, T3FREE, THYROIDAB in the last 72 hours. Anemia Panel: No results for input(s): VITAMINB12, FOLATE, FERRITIN, TIBC, IRON, RETICCTPCT in the last 72 hours. Sepsis Labs: Recent Labs  Lab 01/17/21 1805 01/17/21 2239 01/18/21 0545 01/19/21 0046  PROCALCITON  --    --  43.88 36.36  LATICACIDVEN 2.3* 1.3  --   --     Recent Results (from the past 240 hour(s))  Resp Panel by RT-PCR (Flu A&B, Covid) Nasopharyngeal Swab     Status: None   Collection Time: 01/17/21  5:59 PM   Specimen: Nasopharyngeal Swab; Nasopharyngeal(NP) swabs in vial transport medium  Result Value Ref Range Status   SARS Coronavirus 2 by RT PCR NEGATIVE NEGATIVE Final    Comment: (NOTE) SARS-CoV-2 target nucleic acids are NOT DETECTED.  The SARS-CoV-2 RNA is generally detectable in upper respiratory specimens during the acute phase of infection. The lowest concentration of SARS-CoV-2 viral copies this assay can detect is 138 copies/mL. A negative result does not preclude SARS-Cov-2 infection and should not be used as the sole basis for treatment or other patient management decisions. A negative result may occur with  improper specimen collection/handling, submission of specimen other than nasopharyngeal swab, presence of viral mutation(s) within the areas targeted by this assay, and inadequate number of viral copies(<138 copies/mL). A negative result must be combined with clinical observations, patient history, and epidemiological information. The expected result is Negative.  Fact Sheet for Patients:  EntrepreneurPulse.com.au  Fact Sheet for Healthcare Providers:  IncredibleEmployment.be  This test is no t yet approved or cleared by the [place] FDA and  has been authorized for detection and/or diagnosis of SARS-CoV-2 by FDA under an Emergency Use Authorization (EUA). This EUA will remain  in effect (meaning this test can be used) for the duration of the COVID-19 declaration under Section 564(b)(1) of the Act, 21 U.S.C.section 360bbb-3(b)(1), unless the authorization is terminated  or revoked sooner.       Influenza A by PCR NEGATIVE NEGATIVE Final   Influenza B by PCR NEGATIVE NEGATIVE Final    Comment: (NOTE) The Xpert  Xpress SARS-CoV-2/FLU/RSV plus assay is intended as an aid in the diagnosis of influenza from Nasopharyngeal swab specimens and should not be used as a sole basis for treatment. Nasal washings and aspirates are unacceptable for Xpert Xpress SARS-CoV-2/FLU/RSV testing.  Fact Sheet for Patients: EntrepreneurPulse.com.au  Fact Sheet for Healthcare Providers: IncredibleEmployment.be  This test is not yet approved  or cleared by the [place] and has been authorized for detection and/or diagnosis of SARS-CoV-2 by FDA under an Emergency Use Authorization (EUA). This EUA will remain in effect (meaning this test can be used) for the duration of the COVID-19 declaration under Section 564(b)(1) of the Act, 21 U.S.C. section 360bbb-3(b)(1), unless the authorization is terminated or revoked.  Performed at [facility], [place] [address], [place], [place] [address]   Blood culture (routine x 2)     Status: Abnormal (Preliminary result)   Collection Time: 01/17/21  8:46 PM   Specimen: BLOOD LEFT HAND  Result Value Ref Range Status   Specimen Description BLOOD LEFT HAND  Final   Special Requests   Final    BOTTLES DRAWN AEROBIC AND ANAEROBIC Blood Culture results may not be optimal due to an inadequate volume of blood received in culture bottles   Culture  Setup Time   Final    GRAM POSITIVE COCCI IN PAIRS AND CHAINS IN BOTH AEROBIC AND ANAEROBIC BOTTLES CRITICAL RESULT CALLED TO, READ BACK BY AND VERIFIED WITH: PHARM D J.ORIET ON [id number] AT 2423 BY E.PARRISH    Culture (A)  Final    GRANULICATELLA ADIACENS CULTURE REINCUBATED FOR BETTER GROWTH Performed at [facility], [place] [address], [place], [place] [address]    Report Status PENDING  Incomplete  Blood culture (routine x 2)     Status: Abnormal (Preliminary result)   Collection Time: 01/17/21  8:46 PM   Specimen: BLOOD  Result Value Ref Range Status   Specimen Description BLOOD  LEFT ANTECUBITAL  Final   Special Requests   Final    BOTTLES DRAWN AEROBIC AND ANAEROBIC Blood Culture results may not be optimal due to an inadequate volume of blood received in culture bottles   Culture  Setup Time   Final    GRAM POSITIVE COCCI IN PAIRS AND CHAINS IN BOTH AEROBIC AND ANAEROBIC BOTTLES CRITICAL VALUE NOTED.  VALUE IS CONSISTENT WITH PREVIOUSLY REPORTED AND CALLED VALUE. Performed at [facility], [place] [address], [place], [place] [address]    Culture GRANULICATELLA ADIACENS (A)  Final   Report Status PENDING  Incomplete  Blood Culture ID Panel (Reflexed)     Status: None   Collection Time: 01/17/21  8:46 PM  Result Value Ref Range Status   Enterococcus faecalis NOT DETECTED NOT DETECTED Final   Enterococcus Faecium NOT DETECTED NOT DETECTED Final   Listeria monocytogenes NOT DETECTED NOT DETECTED Final   Staphylococcus species NOT DETECTED NOT DETECTED Final   Staphylococcus aureus (BCID) NOT DETECTED NOT DETECTED Final   Staphylococcus epidermidis NOT DETECTED NOT DETECTED Final   Staphylococcus lugdunensis NOT DETECTED NOT DETECTED Final   Streptococcus species NOT DETECTED NOT DETECTED Final   Streptococcus agalactiae NOT DETECTED NOT DETECTED Final   Streptococcus pneumoniae NOT DETECTED NOT DETECTED Final   Streptococcus pyogenes NOT DETECTED NOT DETECTED Final   A.calcoaceticus-baumannii NOT DETECTED NOT DETECTED Final   Bacteroides fragilis NOT DETECTED NOT DETECTED Final   Enterobacterales NOT DETECTED NOT DETECTED Final   Enterobacter cloacae complex NOT DETECTED NOT DETECTED Final   Escherichia coli NOT DETECTED NOT DETECTED Final   Klebsiella aerogenes NOT DETECTED NOT DETECTED Final   Klebsiella oxytoca NOT DETECTED NOT DETECTED Final   Klebsiella pneumoniae NOT DETECTED NOT DETECTED Final   Proteus species NOT DETECTED NOT DETECTED Final   Salmonella species NOT DETECTED NOT DETECTED Final   Serratia marcescens NOT DETECTED NOT DETECTED Final  Haemophilus influenzae NOT DETECTED NOT DETECTED Final   Neisseria meningitidis NOT DETECTED NOT DETECTED Final   Pseudomonas aeruginosa NOT DETECTED NOT DETECTED Final   Stenotrophomonas maltophilia NOT DETECTED NOT DETECTED Final   Candida albicans NOT DETECTED NOT DETECTED Final   Candida auris NOT DETECTED NOT DETECTED Final   Candida glabrata NOT DETECTED NOT DETECTED Final   Candida krusei NOT DETECTED NOT DETECTED Final   Candida parapsilosis NOT DETECTED NOT DETECTED Final   Candida tropicalis NOT DETECTED NOT DETECTED Final   Cryptococcus neoformans/gattii NOT DETECTED NOT DETECTED Final    Comment: Performed at [facility], [place] [address], [place], [place] [address]  Culture, blood (routine x 2)     Status: None (Preliminary result)   Collection Time: 01/19/21  8:56 AM   Specimen: BLOOD LEFT HAND  Result Value Ref Range Status   Specimen Description BLOOD LEFT HAND  Final   Special Requests   Final    BOTTLES DRAWN AEROBIC ONLY Blood Culture results may not be optimal due to an inadequate volume of blood received in culture bottles   Culture   Final    NO GROWTH 1 DAY Performed at [facility], [place] [address] [place], [place] [address]    Report Status PENDING  Incomplete  Culture, blood (routine x 2)     Status: None (Preliminary result)   Collection Time: 01/19/21  8:58 AM   Specimen: BLOOD LEFT HAND  Result Value Ref Range Status   Specimen Description BLOOD LEFT HAND  Final   Special Requests   Final    BOTTLES DRAWN AEROBIC ONLY Blood Culture results may not be optimal due to an inadequate volume of blood received in culture bottles   Culture   Final    NO GROWTH 1 DAY Performed at [facility], [place] [address] [place], [place] [address]    Report Status PENDING  Incomplete  MRSA Next Gen by PCR, Nasal     Status: None   Collection Time: 01/20/21  5:31 AM   Specimen: Nasal Mucosa; Nasal Swab  Result Value Ref Range Status   MRSA by PCR  Next Gen NOT DETECTED NOT DETECTED Final    Comment: (NOTE) The GeneXpert MRSA Assay (FDA approved for NASAL specimens only), is one component of a comprehensive MRSA colonization surveillance program. It is not intended to diagnose MRSA infection nor to guide or monitor treatment for MRSA infections. Test performance is not FDA approved in patients less than 64 years old. Performed at [facility], [place] [address] [place], [place] [address]          Radiology Studies: MR THORACIC SPINE WO CONTRAST  Result Date: 01/21/2021 CLINICAL DATA:  Initial evaluation for worsening mid back pain, history of bacteremia. EXAM: MRI THORACIC SPINE WITHOUT CONTRAST TECHNIQUE: Multiplanar, multisequence MR imaging of the thoracic spine was performed. No intravenous contrast was administered. COMPARISON:  None available. FINDINGS: Alignment: Dextroscoliosis with mild exaggeration of the normal thoracic kyphosis. No significant listhesis. Vertebrae: Susceptibility artifact from prior ACDF noted at C3-C7 on counter sequence. Vertebral body height maintained without acute or chronic fracture. Bone marrow signal intensity is diffusely decreased and heterogeneous on T1 weighted sequence, most likely related to history of end-stage renal disease and anemia. Extensive discogenic reactive endplate change present throughout the midthoracic spine extending from T6-7 through T11-12, favored to be degenerative in nature due to underlying scoliotic curvature. Associated marrow edema seen within the endplates adjacent to  the T8-9 interspace, and to a lesser extent the T1-2, T6-7 and T7-8 interspaces. No definite edema within the intervening discs themselves. Findings are favored to be discogenic and reactive in nature, although changes of early osteomyelitis discitis are difficult to exclude, and could be considered in the correct clinical setting. No other evidence for acute infection within the thoracic spine. No  worrisome osseous lesions. Cord: Patchy signal abnormality seen involving the cervical spinal cord at approximately the level of C3-4, likely reflecting chronic myelomalacia (series 15, image 16). Signal intensity within the visualized cord is otherwise within normal limits. Conus terminates at approximately the L1 level. No epidural collections. Paraspinal and other soft tissues: Paraspinous soft tissues demonstrate no acute finding. No definite paraspinous inflammatory changes seen about the T6-7 through T8-9 interspaces. No soft tissue collections. Small layering bilateral pleural effusions, left slightly larger than right. Disc levels: T1-2: Diffuse disc bulge with reactive endplate change, with left greater than right facet hypertrophy. No spinal stenosis. Mild to moderate bilateral foraminal narrowing. T2-3: Mild disc bulge with facet hypertrophy. No spinal stenosis. Foramina remain patent. T3-4: Small central disc extrusion with inferior migration. Mild facet hypertrophy. No significant spinal stenosis. Foramina remain patent. T4-5: Small right subarticular disc protrusion minimally indents the right ventral thecal sac. Associated mild reactive endplate spurring with bilateral facet hypertrophy. No spinal stenosis. Foramina remain patent. T5-6: Disc desiccation without significant disc bulge. Right greater than left facet hypertrophy. No spinal stenosis. Mild right foraminal narrowing. T6-7: Degenerative intervertebral disc space narrowing with diffuse disc osteophyte complex, eccentric to the left. Mild reactive marrow edema favored to be degenerative. Mild flattening of the left ventral cord without cord signal changes or significant spinal stenosis. Mild bilateral foraminal narrowing. T7-8: Advanced degenerative intervertebral disc space narrowing with diffuse disc osteophyte complex. Mild reactive marrow edema favored to be degenerative. Bilateral facet hypertrophy. Resultant mild spinal stenosis without  cord impingement. Moderate right with mild left foraminal narrowing. T8-9: Advanced degenerative intervertebral disc space narrowing with diffuse disc osteophyte complex. Bilateral facet hypertrophy. Resultant mild spinal stenosis without cord impingement. Moderate bilateral foraminal narrowing. T9-10: Advanced degenerative intervertebral disc space narrowing with essentially ankylosis of the T9 and T10 vertebral bodies. Posterior osseous ridging flattens and partially faces the ventral thecal sac. Bilateral facet hypertrophy. Resultant mild spinal stenosis without frank cord impingement. Mild right worse than left foraminal narrowing. T10-11: Advanced degenerative intervertebral disc space narrowing with ankylosis. Posterior endplate osseous ridging with flattening of the ventral thecal sac, eccentric to the left. Left worse than right facet degeneration. Mild spinal stenosis without cord impingement. Mild to moderate bilateral foraminal narrowing. T11-12: Advanced degenerative intervertebral disc space narrowing with ankylosis. Bilateral facet hypertrophy with ankylosis as well. No significant spinal stenosis. Mild to moderate right with mild left foraminal narrowing. T12-L1:  Negative interspace.  Mild facet hypertrophy.  No stenosis. IMPRESSION: 1. 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 Resultant mild diffuse spinal stenosis at T6-7 through T10-11, with associated mild to moderate multilevel foraminal narrowing as detailed above. 2. Mild marrow edema involving the endplates at Z6-1 through T8-9, favored to be degenerative in nature. Possible changes of early osteomyelitis discitis are difficult to exclude, although are felt to be unlikely. If patient's symptoms should persist and/or worsen, a short interval follow-up MRI to evaluate for interval change may be helpful for further evaluation as warranted. No  other evidence for acute infection within the thoracic spine. 3. Patchy signal abnormality  involving the cervical spinal cord at the level of C3-4, most consistent with chronic myelomalacia. 4. Small layering bilateral pleural effusions, left greater than right. Electronically Signed   By: Jeannine Boga M.D.   On: 01/21/2021 00:42   ECHOCARDIOGRAM COMPLETE  Result Date: 01/19/2021    ECHOCARDIOGRAM REPORT   Patient Name:   LAKERIA STARKMAN Date of Exam: 01/19/2021 Medical Rec #:  [id number]        Height:       60.0 in Accession #:    [id number]       Weight:       150.8 lb Date of Birth:  Jul 20, 1950       BSA:          1.656 m Patient Age:    32 years         BP:           152/71 mmHg Patient Gender: F                HR:           88 bpm. Exam Location:  Inpatient Procedure: 2D Echo, Cardiac Doppler and Color Doppler Indications:    Bacteremia  History:        Patient has prior history of Echocardiogram examinations, most                 recent 10/15/2020. CHF, Signs/Symptoms:ESRD, anemia, ERSD; Risk                 Factors:Diabetes, Hypertension and Dyslipidemia.  Sonographer:    Dustin Flock Referring Phys: [id number] [place]  1. Left ventricular ejection fraction, by estimation, is 55%. The left ventricle has normal function. The left ventricle has no regional wall motion abnormalities. There is mild left ventricular hypertrophy. Left ventricular diastolic parameters are consistent with Grade I diastolic dysfunction (impaired relaxation).  2. Peak RV-RA gradient 22 mmHg. Right ventricular systolic function is mildly reduced. The right ventricular size is normal.  3. The mitral valve is normal in structure. Trivial mitral valve regurgitation. No evidence of mitral stenosis.  4. The aortic valve is tricuspid. Aortic valve regurgitation is not visualized. No aortic stenosis is present.  5. The IVC was not visualized.  6. Technically difficult study with poor acoustic windows. FINDINGS   Left Ventricle: Left ventricular ejection fraction, by estimation, is 55%. The left ventricle has normal function. The left ventricle has no regional wall motion abnormalities. The left ventricular internal cavity size was normal in size. There is mild left ventricular hypertrophy. Left ventricular diastolic parameters are consistent with Grade I diastolic dysfunction (impaired relaxation). Right Ventricle: Peak RV-RA gradient 22 mmHg. The right ventricular size is normal. No increase in right ventricular wall thickness. Right ventricular systolic function is mildly reduced. Left Atrium: Left atrial size was normal in size. Right Atrium: Right atrial size was normal in size. Pericardium: There is no evidence of pericardial effusion. Mitral Valve: The mitral valve is normal in structure. Trivial mitral valve regurgitation. No evidence of mitral valve stenosis. Tricuspid Valve: The tricuspid valve is normal in structure. Tricuspid valve regurgitation is trivial. Aortic Valve: The aortic valve is tricuspid. Aortic valve regurgitation is not visualized. No aortic stenosis is present. Aortic valve peak gradient measures 10.8 mmHg. Pulmonic Valve: The pulmonic valve was not well visualized. Pulmonic valve regurgitation is not visualized. Aorta: The aortic root is normal in size and structure. Venous: The inferior vena cava was not well visualized. IAS/Shunts: No  atrial level shunt detected by color flow Doppler.  LEFT VENTRICLE PLAX 2D LVIDd:         3.80 cm  Diastology LVIDs:         2.90 cm  LV e' medial:    3.26 cm/s LV PW:         1.00 cm  LV E/e' medial:  29.3 LV IVS:        1.30 cm  LV e' lateral:   4.68 cm/s LVOT diam:     2.00 cm  LV E/e' lateral: 20.4 LV SV:         71 LV SV Index:   43 LVOT Area:     3.14 cm  RIGHT VENTRICLE RV Basal diam:  3.30 cm RV S prime:     9.90 cm/s TAPSE (M-mode): 2.3 cm LEFT ATRIUM           Index       RIGHT ATRIUM           Index LA diam:      4.00 cm 2.42 cm/m  RA Area:     16.90  cm LA Vol (A4C): 55.4 ml 33.46 ml/m RA Volume:   41.10 ml  24.83 ml/m  AORTIC VALVE AV Area (Vmax): 2.16 cm AV Vmax:        164.00 cm/s AV Peak Grad:   10.8 mmHg LVOT Vmax:      113.00 cm/s LVOT Vmean:     77.300 cm/s LVOT VTI:       0.225 m  AORTA Ao Root diam: 3.00 cm MITRAL VALVE                TRICUSPID VALVE MV Area (PHT): 6.32 cm     TR Peak grad:   22.1 mmHg MV Decel Time: 120 msec     TR Vmax:        235.00 cm/s MV E velocity: 95.40 cm/s MV A velocity: 136.00 cm/s  SHUNTS MV E/A ratio:  0.70         Systemic VTI:  0.22 m                             Systemic Diam: 2.00 cm Loralie Champagne MD Electronically signed by Loralie Champagne MD Signature Date/Time: 01/19/2021/5:51:51 PM    Final         Scheduled Meds:  aspirin EC  81 mg Oral Once per day on Mon Wed Fri   Chlorhexidine Gluconate Cloth  6 each Topical Daily   simvastatin  40 mg Oral QHS   And   ezetimibe  10 mg Oral QHS   heparin injection (subcutaneous)  5,000 Units Subcutaneous Q8H   insulin aspart  0-9 Units Subcutaneous TID WC   insulin glargine  14 Units Subcutaneous Daily   lidocaine  1 patch Transdermal Q24H   LORazepam  0.5 mg Intravenous Once   losartan  12.5 mg Oral Daily   midodrine  10 mg Oral Once per day on Tue Thu Sat   mometasone-formoterol  2 puff Inhalation BID   Continuous Infusions:  vancomycin 750 mg (01/20/21 1454)     LOS: 1 day    Time spent: 35 minutes    Geradine Girt, DO [facility]  If 7PM-7AM, please contact night-coverage [email or web address] 01/21/2021, 11:14 AM

## 2021-01-22 ENCOUNTER — Encounter (HOSPITAL_COMMUNITY): Payer: Self-pay | Admitting: Internal Medicine

## 2021-01-22 ENCOUNTER — Inpatient Hospital Stay (HOSPITAL_COMMUNITY): Payer: Medicare Other | Admitting: Certified Registered Nurse Anesthetist

## 2021-01-22 ENCOUNTER — Encounter (HOSPITAL_COMMUNITY)
Admission: EM | Disposition: A | Discharge status: Home Health | Payer: Self-pay | Source: Home / Self Care | Attending: Internal Medicine

## 2021-01-22 ENCOUNTER — Inpatient Hospital Stay (HOSPITAL_COMMUNITY): Payer: Medicare Other

## 2021-01-22 DIAGNOSIS — I358 Other nonrheumatic aortic valve disorders: Secondary | ICD-10-CM

## 2021-01-22 DIAGNOSIS — R7881 Bacteremia: Secondary | ICD-10-CM | POA: Diagnosis not present

## 2021-01-22 HISTORY — PX: TEE WITHOUT CARDIOVERSION: SHX5443

## 2021-01-22 LAB — BASIC METABOLIC PANEL
Anion gap: 15 (ref 5–15)
BUN: 43 mg/dL — ABNORMAL HIGH (ref 8–23)
CO2: 25 mmol/L (ref 22–32)
Calcium: 8.6 mg/dL — ABNORMAL LOW (ref 8.9–10.3)
Chloride: 88 mmol/L — ABNORMAL LOW (ref 98–111)
Creatinine, Ser: 8.36 mg/dL — ABNORMAL HIGH (ref 0.44–1.00)
GFR, Estimated: 5 mL/min — ABNORMAL LOW (ref >60)
Glucose, Bld: 291 mg/dL — ABNORMAL HIGH (ref 70–99)
Potassium: 4.2 mmol/L (ref 3.5–5.1)
Sodium: 128 mmol/L — ABNORMAL LOW (ref 135–145)

## 2021-01-22 LAB — CBC
HCT: 25.3 % — ABNORMAL LOW (ref 36.0–46.0)
Hemoglobin: 9 g/dL — ABNORMAL LOW (ref 12.0–15.0)
MCH: 28.6 pg (ref 26.0–34.0)
MCHC: 35.6 g/dL (ref 30.0–36.0)
MCV: 80.3 fL (ref 80.0–100.0)
Platelets: 234 10*3/uL (ref 150–400)
RBC: 3.15 MIL/uL — ABNORMAL LOW (ref 3.87–5.11)
RDW: 19.7 % — ABNORMAL HIGH (ref 11.5–15.5)
WBC: 13.5 10*3/uL — ABNORMAL HIGH (ref 4.0–10.5)
nRBC: 0 % (ref 0.0–0.2)

## 2021-01-22 LAB — GLUCOSE, CAPILLARY
Glucose-Capillary: 276 mg/dL — ABNORMAL HIGH (ref 70–99)
Glucose-Capillary: 142 mg/dL — ABNORMAL HIGH (ref 70–99)
Glucose-Capillary: 126 mg/dL — ABNORMAL HIGH (ref 70–99)
Glucose-Capillary: 191 mg/dL — ABNORMAL HIGH (ref 70–99)
Glucose-Capillary: 258 mg/dL — ABNORMAL HIGH (ref 70–99)

## 2021-01-22 LAB — HEPATITIS B SURFACE ANTIGEN: Hepatitis B Surface Ag: NONREACTIVE

## 2021-01-22 LAB — PHOSPHORUS: Phosphorus: 4.4 mg/dL (ref 2.5–4.6)

## 2021-01-22 SURGERY — ECHOCARDIOGRAM, TRANSESOPHAGEAL
Anesthesia: Monitor Anesthesia Care

## 2021-01-22 MED ORDER — DARBEPOETIN ALFA 60 MCG/0.3ML IJ SOSY
PREFILLED_SYRINGE | INTRAMUSCULAR | Status: AC
  Administered 2021-01-22: 60 ug via INTRAVENOUS
  Filled 2021-01-22: qty 0.3

## 2021-01-22 MED ORDER — SODIUM CHLORIDE 0.9 % IV SOLN: INTRAVENOUS | Status: DC

## 2021-01-22 MED ORDER — PROPOFOL 10 MG/ML IV BOLUS
INTRAVENOUS | Status: DC | PRN
  Administered 2021-01-22 (×4): 10 mg via INTRAVENOUS

## 2021-01-22 MED ORDER — SODIUM CHLORIDE 0.9 % IV SOLN: 100.0000 mL | INTRAVENOUS | Status: DC | PRN

## 2021-01-22 MED ORDER — HEPARIN SODIUM (PORCINE) 1000 UNIT/ML DIALYSIS: 3000.0000 [IU] | INTRAMUSCULAR | Status: DC | PRN

## 2021-01-22 MED ORDER — PROPOFOL 500 MG/50ML IV EMUL
INTRAVENOUS | Status: DC | PRN
  Administered 2021-01-22: 125 ug/kg/min via INTRAVENOUS

## 2021-01-22 MED ORDER — LIDOCAINE-PRILOCAINE 2.5-2.5 % EX CREA: 1.0000 "application " | TOPICAL_CREAM | CUTANEOUS | Status: DC | PRN

## 2021-01-22 MED ORDER — SODIUM CHLORIDE 0.9 % IV SOLN: INTRAVENOUS | Status: DC | PRN

## 2021-01-22 MED ORDER — HEPARIN SODIUM (PORCINE) 1000 UNIT/ML DIALYSIS: 1000.0000 [IU] | INTRAMUSCULAR | Status: DC | PRN

## 2021-01-22 MED ORDER — VANCOMYCIN HCL 750 MG/150ML IV SOLN
INTRAVENOUS | Status: AC
  Administered 2021-01-22: 750 mg via INTRAVENOUS
  Filled 2021-01-22: qty 150

## 2021-01-22 MED ORDER — LIDOCAINE HCL (PF) 1 % IJ SOLN: 5.0000 mL | INTRAMUSCULAR | Status: DC | PRN

## 2021-01-22 MED ORDER — PENTAFLUOROPROP-TETRAFLUOROETH EX AERO: 1.0000 "application " | INHALATION_SPRAY | CUTANEOUS | Status: DC | PRN

## 2021-01-22 MED ORDER — OXYCODONE HCL 5 MG PO TABS
2.5000 mg | ORAL_TABLET | Freq: Four times a day (QID) | ORAL | Status: DC | PRN
  Administered 2021-01-22 – 2021-01-23 (×3): 2.5 mg via ORAL
  Filled 2021-01-22 (×3): qty 1

## 2021-01-22 MED ORDER — ALTEPLASE 2 MG IJ SOLR: 2.0000 mg | Freq: Once | INTRAMUSCULAR | Status: DC | PRN

## 2021-01-22 NOTE — Anesthesia Postprocedure Evaluation (Signed)
Anesthesia Post Note  Patient: EMMAH BRATCHER  Procedure(s) Performed: TRANSESOPHAGEAL ECHOCARDIOGRAM (TEE)     Patient location during evaluation: PACU Anesthesia Type: MAC Level of consciousness: awake and alert Pain management: pain level controlled Vital Signs Assessment: post-procedure vital signs reviewed and stable Respiratory status: spontaneous breathing, nonlabored ventilation, respiratory function stable and patient connected to nasal cannula oxygen Cardiovascular status: stable and blood pressure returned to baseline Postop Assessment: no apparent nausea or vomiting Anesthetic complications: no   No notable events documented.  Last Vitals:  Vitals:   01/22/21 1420 01/22/21 1430  BP: (!) 135/103 (!) 153/65  Pulse: (!) 102 98  Resp: 15 (!) 22  Temp:    SpO2: 99% 100%    Last Pain:  Vitals:   01/22/21 1430  TempSrc:   PainSc: 0-No pain                 Kenneisha Cochrane

## 2021-01-22 NOTE — Transfer of Care (Signed)
Immediate Anesthesia Transfer of Care Note  Patient: Belinda [name]  Procedure(s) Performed: TRANSESOPHAGEAL ECHOCARDIOGRAM (TEE)  Patient Location: Endoscopy Unit  Anesthesia Type:MAC  Level of Consciousness: drowsy and patient cooperative  Airway & Oxygen Therapy: Patient Spontanous Breathing and Patient connected to nasal cannula oxygen  Post-op Assessment: Report given to RN and Post -op Vital signs reviewed and stable  Post vital signs: Reviewed and stable  Last Vitals:  Vitals Value Taken Time  BP    Temp    Pulse 98 01/22/21 1412  Resp 20 01/22/21 1412  SpO2 88 % 01/22/21 1412  Vitals shown include unvalidated device data.  Last Pain:  Vitals:   01/22/21 1237  TempSrc: Oral  PainSc:       Patients Stated Pain Goal: 4 (16/83/72 9021)  Complications: No notable events documented.

## 2021-01-22 NOTE — Interval H&P Note (Signed)
History and Physical Interval Note:  01/22/2021 11:28 AM  Belinda [name]  has presented today for surgery, with the diagnosis of bactetemia.  The various methods of treatment have been discussed with the patient and family. After consideration of risks, benefits and other options for treatment, the patient has consented to  Procedure(s): TRANSESOPHAGEAL ECHOCARDIOGRAM (TEE) (N/A) as a surgical intervention.  The patient's history has been reviewed, patient examined, no change in status, stable for surgery.  I have reviewed the patient's chart and labs.  Questions were answered to the patient's satisfaction.     Skeet Latch, MD

## 2021-01-22 NOTE — Anesthesia Procedure Notes (Signed)
Procedure Name: MAC Date/Time: 01/22/2021 1:42 PM Performed by: Kathryne Hitch, CRNA Pre-anesthesia Checklist: Emergency Drugs available, Patient identified, Suction available and Patient being monitored Patient Re-evaluated:Patient Re-evaluated prior to induction Oxygen Delivery Method: Nasal cannula Preoxygenation: Pre-oxygenation with 100% oxygen Induction Type: IV induction Placement Confirmation: positive ETCO2 Dental Injury: Teeth and Oropharynx as per pre-operative assessment

## 2021-01-22 NOTE — Progress Notes (Signed)
   01/22/21 0900  OT Visit Information  Last OT Received On 01/22/21  Assistance Needed +1  Reason Eval/Treat Not Completed Patient at procedure or test/ unavailable  History of Present Illness Pt is a 70 y/o female admitted 7/24 secondary to increased nausea and vomiting. Found to have Sepsis secondary to granulicatella adiacens. PMH includes ESRD on HD, DM, HTN, and CHF.   Pt is at HD. Plan to reattempt at a later time/date.  Tyrone Schimke, OT Acute Rehabilitation Services Pager: [phone] Office: [phone]

## 2021-01-22 NOTE — Progress Notes (Signed)
CSW following for discharge needs; noted PT recommendation of SNF at discharge and patient's wish to return home. CSW will follow up with patient tomorrow to see if she has changed her mind on going to SNF.   Gilmore Laroche, MSW, [facility]

## 2021-01-22 NOTE — CV Procedure (Signed)
Brief TEE Note  LVEF 60-65% No LA/LAA thrombus or masses No evidence of endocarditis Trivial MR Mild AR  For additional details see full report.  Dong Nimmons C. Oval Linsey, MD, [facility] 01/22/2021 2:07 PM

## 2021-01-22 NOTE — NC FL2 (Signed)
[place] LEVEL OF CARE SCREENING TOOL     IDENTIFICATION  Patient Name: Belinda [name] Birthdate: 1950/11/10 Sex: female Admission Date (Current Location): 01/17/2021  [facility] and [place] Number:  Herbalist and Address:  [facility] [place]. [facility], [place] [address] [place], [place] [address]      Provider Number: [id number]  Attending Physician Name and Address:  Geradine Girt, DO  Relative Name and Phone Number:       Current Level of Care: Hospital Recommended Level of Care: [place] Prior Approval Number:    Date Approved/Denied:   PASRR Number: [id number]  Discharge Plan: SNF    Current Diagnoses: Patient Active Problem List   Diagnosis Date Noted   Bacteremia 01/20/2021   Sepsis due to undetermined organism ([place]) 01/18/2021   ESRD on hemodialysis ([place]) 01/18/2021   Anemia due to end stage renal disease ([place]) 01/18/2021   Abnormal liver function tests 01/18/2021   Hypoxia 01/18/2021   Renal failure (ARF), acute on chronic (HCC)    Hyperglycemia    Pressure injury of skin 02/04/2018   Acute CHF (congestive heart failure) ([place]) 02/02/2018   DM (diabetes mellitus), type 2 with renal complications ([place]) 29/93/7169   Bilateral lower extremity edema 67/89/3810   Chronic systolic (congestive) heart failure ([place]) 17/51/0258   Acute systolic (congestive) heart failure ([place]) 05/30/2017   Essential hypertension 05/30/2017   Congestive heart failure ([place]) 04/26/2017   Chronic kidney disease 04/26/2017   DJD (degenerative joint disease) of knee 02/12/2015   Frequent PVCs 11/12/2014   Cervical spondylosis with myelopathy and radiculopathy 01/02/2014   Cervical spondylosis with myelopathy 01/01/2014   Wound check, abscess 06/14/2013   Cellulitis and abscess of buttock - right 06/12/2013   Dyspnea on exertion 03/22/2011   Chest pain 03/22/2011   Diabetes mellitus    Back pain    Palpitations    Anemia    Hx MRSA  infection    Osteoarthritis     Orientation RESPIRATION BLADDER Height & Weight     Self, Time, Situation, Place  O2 (Nasal cannula 2L) Continent, External catheter Weight: 154 lb 1.6 oz (69.9 kg) Height:  5' (152.4 cm)  BEHAVIORAL SYMPTOMS/MOOD NEUROLOGICAL BOWEL NUTRITION STATUS      Continent Diet (Please see DC Summary)  AMBULATORY STATUS COMMUNICATION OF NEEDS Skin   Extensive Assist Verbally Normal                       Personal Care Assistance Level of Assistance  Bathing, Feeding, Dressing Bathing Assistance: Maximum assistance Feeding assistance: Limited assistance Dressing Assistance: Limited assistance     Functional Limitations Info             SPECIAL CARE FACTORS FREQUENCY  PT (By licensed PT), OT (By licensed OT)     PT Frequency: 5x/week OT Frequency: 5x/week            Contractures Contractures Info: Not present    Additional Factors Info  Code Status, Allergies, Isolation Precautions, Insulin Sliding Scale Code Status Info: Full Allergies Info: Doxycycline, Entresto (Sacubitril-valsartan), Metformin, Omnicef (Cefdinir), Aspirin, Augmentin (Amoxicillin-pot Clavulanate), Erythromycin, Gabapentin, Nickel   Insulin Sliding Scale Info: See dc summary Isolation Precautions Info: MRSA     Current Medications (01/22/2021):  This is the current hospital active medication list Current Facility-Administered Medications  Medication Dose Route Frequency Provider Last Rate Last Admin   acetaminophen (TYLENOL) tablet 650 mg  650 mg Oral Q6H  PRN Skeet Latch, MD   650 mg at 01/22/21 1449   Or   acetaminophen (TYLENOL) suppository 650 mg  650 mg Rectal Q6H PRN Skeet Latch, MD       ALPRAZolam Duanne Moron) tablet 0.25-0.5 mg  0.25-0.5 mg Oral BID PRN Skeet Latch, MD   0.5 mg at 01/21/21 2142   aspirin EC tablet 81 mg  81 mg Oral Once per day on Mon Wed Fri [place], Tiffany, MD   81 mg at 01/21/21 0945   Chlorhexidine Gluconate Cloth 2 % PADS  6 each  6 each Topical Daily Skeet Latch, MD   6 each at 01/22/21 0806   Darbepoetin Alfa (ARANESP) injection 60 mcg  60 mcg Intravenous Q Thu-HD Skeet Latch, MD   60 mcg at 01/22/21 1042   diphenhydrAMINE (BENADRYL) capsule 25-50 mg  25-50 mg Oral Q6H PRN Skeet Latch, MD   50 mg at 01/20/21 1134   simvastatin (ZOCOR) tablet 40 mg  40 mg Oral QHS Skeet Latch, MD   40 mg at 01/21/21 2141   And   ezetimibe (ZETIA) tablet 10 mg  10 mg Oral QHS Skeet Latch, MD   10 mg at 01/21/21 2142   guaiFENesin (MUCINEX) 12 hr tablet 600 mg  600 mg Oral BID PRN Skeet Latch, MD   600 mg at 01/22/21 0805   heparin injection 5,000 Units  5,000 Units Subcutaneous Q8H Skeet Latch, MD   5,000 Units at 01/22/21 1454   insulin aspart (novoLOG) injection 0-9 Units  0-9 Units Subcutaneous TID WC Skeet Latch, MD   2 Units at 01/22/21 1653   insulin glargine (LANTUS) injection 14 Units  14 Units Subcutaneous Daily Skeet Latch, MD   14 Units at 01/22/21 0805   lidocaine (LIDODERM) 5 % 1 patch  1 patch Transdermal Q24H Skeet Latch, MD   1 patch at 01/22/21 0804   losartan (COZAAR) tablet 12.5 mg  12.5 mg Oral Daily Skeet Latch, MD   12.5 mg at 01/22/21 1454   midodrine (PROAMATINE) tablet 10 mg  10 mg Oral Once per day on Tue Thu Sat Skeet Latch, MD   10 mg at 01/22/21 0805   mometasone-formoterol (DULERA) 200-5 MCG/ACT inhaler 2 puff  2 puff Inhalation BID Skeet Latch, MD   2 puff at 01/22/21 0806   ondansetron (ZOFRAN) tablet 4 mg  4 mg Oral Q6H PRN Skeet Latch, MD       Or   ondansetron [facility]) injection 4 mg  4 mg Intravenous Q6H PRN Skeet Latch, MD       oxyCODONE (Oxy IR/ROXICODONE) immediate release tablet 2.5 mg  2.5 mg Oral Q6H PRN Eulogio Bear U, DO   2.5 mg at 01/22/21 1504   sorbitol 70 % solution 30 mL  30 mL Oral Daily PRN Skeet Latch, MD       vancomycin (VANCOREADY) IVPB 750 mg/150 mL  750 mg Intravenous Q  T,Th,Sa-HD Skeet Latch, MD 150 mL/hr at 01/22/21 1048 750 mg at 01/22/21 1048     Discharge Medications: Please see discharge summary for a list of discharge medications.  Relevant Imaging Results:  Relevant Lab Results:   Additional Information SS#[id number]. Pfizer vaccines 08/02/19, 08/23/19, 03/27/20. Dialysis at [facility] TTS. May need IV Vancomycin at discharge.  Benard Halsted, LCSW

## 2021-01-22 NOTE — Progress Notes (Addendum)
[facility] Progress Note   Subjective:   Patient seen and examined at bedside in dialysis.  Feeling poorly today.  States she is tired, hungry and a little nauseated.  Continues to have SOB which is not normal for her.  Nervous about procedure this afternoon.  Provided comfort.   Objective Vitals:   01/22/21 0900 01/22/21 0924 01/22/21 0956 01/22/21 1028  BP: (!) 209/79 (!) 180/84 (!) 155/76 139/71  Pulse: 98 (!) 102 (!) 102 (!) 102  Resp:      Temp:      TempSrc:      SpO2:      Weight:      Height:       Physical Exam General:well appearing female in NAD Heart:RRR, no mrg appreciated Lungs:CTAB, nml WOB on 2L O2 via [place] Abdomen:soft, NTND Extremities:no LE edema Dialysis Access: R thigh AVG cannulated, no erythema/edema or tenderness   Filed Weights   01/20/21 0535 01/20/21 1616  Weight: 69.9 kg 69.9 kg    Intake/Output Summary (Last 24 hours) at 01/22/2021 1054 Last data filed at 01/22/2021 0758 Gross per 24 hour  Intake 230 ml  Output 0 ml  Net 230 ml    Additional Objective Labs: Basic Metabolic Panel: Recent Labs  Lab 01/19/21 0046 01/20/21 0214 01/22/21 0020  NA 129* 129* 128*  K 3.8 4.0 4.2  CL 88* 87* 88*  CO2 26 25 25   GLUCOSE 346* 310* 291*  BUN 53* 64* 43*  CREATININE 8.83* 10.62* 8.36*  CALCIUM 8.5* 8.4* 8.6*   Liver Function Tests: Recent Labs  Lab 01/17/21 1805 01/18/21 0545 01/20/21 0214  AST 41 31 20  ALT 67* 46* 30  ALKPHOS 242* 196* 203*  BILITOT 2.1* 1.8* 1.1  PROT 7.1 5.6* 5.6*  ALBUMIN 3.3* 2.6* 2.2*   CBC: Recent Labs  Lab 01/17/21 1805 01/18/21 0545 01/19/21 0046 01/20/21 0214 01/22/21 0020  WBC 9.4 8.0 8.7 14.0* 13.5*  NEUTROABS 7.1 5.2  --   --   --   HGB 11.0* 10.0* 8.9* 9.5* 9.0*  HCT 31.1* 29.2* 25.0* 26.5* 25.3*  MCV 82.7 84.6 81.4 80.1 80.3  PLT PLATELET CLUMPS NOTED ON SMEAR, UNABLE TO ESTIMATE 79* 88* 141* 234   Blood Culture    Component Value Date/Time   SDES BLOOD LEFT HAND  01/19/2021 0858   SPECREQUEST  01/19/2021 0858    BOTTLES DRAWN AEROBIC ONLY Blood Culture results may not be optimal due to an inadequate volume of blood received in culture bottles   CULT  01/19/2021 0858    NO GROWTH 2 DAYS Performed at [facility], [address], [place],  [address]    REPTSTATUS PENDING 01/19/2021 0858    CBG: Recent Labs  Lab 01/21/21 1255 01/21/21 1613 01/21/21 2033 01/22/21 0757 01/22/21 1045  GLUCAP 277* 345* 360* 276* 142*    Studies/Results: MR THORACIC SPINE WO CONTRAST  Result Date: 01/21/2021 CLINICAL DATA:  Initial evaluation for worsening mid back pain, history of bacteremia. EXAM: MRI THORACIC SPINE WITHOUT CONTRAST TECHNIQUE: Multiplanar, multisequence MR imaging of the thoracic spine was performed. No intravenous contrast was administered. COMPARISON:  None available. FINDINGS: Alignment: Dextroscoliosis with mild exaggeration of the normal thoracic kyphosis. No significant listhesis. Vertebrae: Susceptibility artifact from prior ACDF noted at C3-C7 on counter sequence. Vertebral body height maintained without acute or chronic fracture. Bone marrow signal intensity is diffusely decreased and heterogeneous on T1 weighted sequence, most likely related to history of end-stage renal disease and anemia.  Extensive discogenic reactive endplate change present throughout the midthoracic spine extending from T6-7 through T11-12, favored to be degenerative in nature due to underlying scoliotic curvature. Associated marrow edema seen within the endplates adjacent to the T8-9 interspace, and to a lesser extent the T1-2, T6-7 and T7-8 interspaces. No definite edema within the intervening discs themselves. Findings are favored to be discogenic and reactive in nature, although changes of early osteomyelitis discitis are difficult to exclude, and could be considered in the correct clinical setting. No other evidence for acute infection within the thoracic  spine. No worrisome osseous lesions. Cord: Patchy signal abnormality seen involving the cervical spinal cord at approximately the level of C3-4, likely reflecting chronic myelomalacia (series 15, image 16). Signal intensity within the visualized cord is otherwise within normal limits. Conus terminates at approximately the L1 level. No epidural collections. Paraspinal and other soft tissues: Paraspinous soft tissues demonstrate no acute finding. No definite paraspinous inflammatory changes seen about the T6-7 through T8-9 interspaces. No soft tissue collections. Small layering bilateral pleural effusions, left slightly larger than right. Disc levels: T1-2: Diffuse disc bulge with reactive endplate change, with left greater than right facet hypertrophy. No spinal stenosis. Mild to moderate bilateral foraminal narrowing. T2-3: Mild disc bulge with facet hypertrophy. No spinal stenosis. Foramina remain patent. T3-4: Small central disc extrusion with inferior migration. Mild facet hypertrophy. No significant spinal stenosis. Foramina remain patent. T4-5: Small right subarticular disc protrusion minimally indents the right ventral thecal sac. Associated mild reactive endplate spurring with bilateral facet hypertrophy. No spinal stenosis. Foramina remain patent. T5-6: Disc desiccation without significant disc bulge. Right greater than left facet hypertrophy. No spinal stenosis. Mild right foraminal narrowing. T6-7: Degenerative intervertebral disc space narrowing with diffuse disc osteophyte complex, eccentric to the left. Mild reactive marrow edema favored to be degenerative. Mild flattening of the left ventral cord without cord signal changes or significant spinal stenosis. Mild bilateral foraminal narrowing. T7-8: Advanced degenerative intervertebral disc space narrowing with diffuse disc osteophyte complex. Mild reactive marrow edema favored to be degenerative. Bilateral facet hypertrophy. Resultant mild spinal  stenosis without cord impingement. Moderate right with mild left foraminal narrowing. T8-9: Advanced degenerative intervertebral disc space narrowing with diffuse disc osteophyte complex. Bilateral facet hypertrophy. Resultant mild spinal stenosis without cord impingement. Moderate bilateral foraminal narrowing. T9-10: Advanced degenerative intervertebral disc space narrowing with essentially ankylosis of the T9 and T10 vertebral bodies. Posterior osseous ridging flattens and partially faces the ventral thecal sac. Bilateral facet hypertrophy. Resultant mild spinal stenosis without frank cord impingement. Mild right worse than left foraminal narrowing. T10-11: Advanced degenerative intervertebral disc space narrowing with ankylosis. Posterior endplate osseous ridging with flattening of the ventral thecal sac, eccentric to the left. Left worse than right facet degeneration. Mild spinal stenosis without cord impingement. Mild to moderate bilateral foraminal narrowing. T11-12: Advanced degenerative intervertebral disc space narrowing with ankylosis. Bilateral facet hypertrophy with ankylosis as well. No significant spinal stenosis. Mild to moderate right with mild left foraminal narrowing. T12-L1:  Negative interspace.  Mild facet hypertrophy.  No stenosis. IMPRESSION: 1. 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 Resultant mild diffuse spinal stenosis at T6-7 through T10-11, with associated mild to moderate multilevel foraminal narrowing as detailed above. 2. Mild marrow edema involving the endplates at Z6-1 through T8-9, favored to be degenerative in nature. Possible changes of early osteomyelitis discitis are difficult to exclude, although are felt to be unlikely. If patient's symptoms should persist  and/or worsen, a short interval follow-up MRI to evaluate for interval change may be helpful for further evaluation as  warranted. No other evidence for acute infection within the thoracic spine. 3. Patchy signal abnormality involving the cervical spinal cord at the level of C3-4, most consistent with chronic myelomalacia. 4. Small layering bilateral pleural effusions, left greater than right. Electronically Signed   By: Jeannine Boga M.D.   On: 01/21/2021 00:42    Medications:  sodium chloride     sodium chloride     sodium chloride 20 mL/hr at 01/22/21 0701   vancomycin 750 mg (01/22/21 1048)    aspirin EC  81 mg Oral Once per day on Mon Wed Fri   Chlorhexidine Gluconate Cloth  6 each Topical Daily   darbepoetin (ARANESP) injection - DIALYSIS  60 mcg Intravenous Q Thu-HD   simvastatin  40 mg Oral QHS   And   ezetimibe  10 mg Oral QHS   heparin injection (subcutaneous)  5,000 Units Subcutaneous Q8H   insulin aspart  0-9 Units Subcutaneous TID WC   insulin glargine  14 Units Subcutaneous Daily   lidocaine  1 patch Transdermal Q24H   LORazepam  0.5 mg Intravenous Once   losartan  12.5 mg Oral Daily   midodrine  10 mg Oral Once per day on Tue Thu Sat   mometasone-formoterol  2 puff Inhalation BID    Dialysis Orders: [facility] TTS 4h 400/1.5A EDW 67 kg 2K/2Ca UFP 4 AVG Heparin 4000 Mircera 60 q 2wks Calcitriol 1.5 TIW    Assessment/Plan: Sepsis 2/2 Granulicatella bacteremia- Thigh AVG w/o signs of infection. ID consulted, on vancomycin. TEE planned for this afternoon. MRI thoracic spine with degenerative changes, "possible changes of early osteomyelitis discitis are difficult to exclude, although thought to be unlikely."  Repeat BC with NGTD Hypoxia - on 2L O2.  CXR with hypoinflation of the lungs and atelectasis, CT chest with 8 mm ground-glass opacity seen in the left lung apex, infection vs inflammation vs neoplasm. Continues to have SOB, UF goal 3L today, likely weight loss and need to lower dry. Per PMD ESRD -  HD TTS. HD today per regular schedule.  Hypertension/volume  - BP  elevated last night,  improving this AM.  On midodrine with HD. Has been getting below dry weight. Continue to challenge dry weight, get standing weights if able, lower on d/c.  Anemia  - Hgb 9.0, On ESA. Next due today - ordered.  Metabolic bone disease -  Ca ok. checking phos. Continue VDRA and binders. DMT2 Chronic systolic HF  Jen Mow, PA-C [facility] 01/22/2021,10:54 AM  LOS: 2 days   Pt seen, examined and agree w A/P as above.  Kelly Splinter  MD 01/22/2021, 4:48 PM

## 2021-01-22 NOTE — Interval H&P Note (Signed)
History and Physical Interval Note:  01/22/2021 1:42 PM  Belinda [name]  has presented today for surgery, with the diagnosis of bactetemia.  The various methods of treatment have been discussed with the patient and family. After consideration of risks, benefits and other options for treatment, the patient has consented to  Procedure(s): TRANSESOPHAGEAL ECHOCARDIOGRAM (TEE) (N/A) as a surgical intervention.  The patient's history has been reviewed, patient examined, no change in status, stable for surgery.  I have reviewed the patient's chart and labs.  Questions were answered to the patient's satisfaction.     Skeet Latch, MD

## 2021-01-22 NOTE — Progress Notes (Signed)
Inpatient Diabetes Program Recommendations  AACE/ADA: New Consensus Statement on Inpatient Glycemic Control (2015)  Target Ranges:  Prepandial:   less than 140 mg/dL      Peak postprandial:   less than 180 mg/dL (1-2 hours)      Critically ill patients:  140 - 180 mg/dL   Lab Results  Component Value Date   GLUCAP 142 (H) 01/22/2021   HGBA1C 8.0 (H) 01/18/2021    Review of Glycemic Control Results for Belinda [name], Belinda [name] (MRN [id number]) as of 01/22/2021 12:02  Ref. Range 01/21/2021 16:13 01/21/2021 20:33 01/22/2021 07:57 01/22/2021 10:45  Glucose-Capillary Latest Ref Range: 70 - 99 mg/dL 345 (H) 360 (H) 276 (H) 142 (H)   Diabetes history: DM2 Outpatient Diabetes medications: Humalog 4-10 units TID, Lantus 18 units QHS Current orders for Inpatient glycemic control: Novolog 0-9 units TID, Lantus 14 units QD   Inpatient Diabetes Program Recommendations:     Consider increasing Lantus (Symglee) to 18 units QD (pt's home dose).   Thanks, Bronson Curb, MSN, RNC-OB Diabetes Coordinator [phone] (8a-5p)

## 2021-01-22 NOTE — Progress Notes (Signed)
PROGRESS NOTE    Belinda [name]  ZDG:[id number] DOB: 1951-06-03 DOA: 01/17/2021 PCP: Reynold Bowen, MD   Brief Narrative:   Belinda [name] is a 70 y.o. female with medical history significant of chronic systolic heart failure, ESRD anemia, ESRD on hemodialysis, anxiety/depression, chronic cervical and lumbar back pain, GERD, history of unspecified MRSA infection.   She states that she has been ill for about a week, had a sandwich from [place] [employer] [employer] on Sunday then subsequently started to have multiple episodes of vomiting.  Then on Wednesday, finished her leftover sandwich, then continued to have multiple episodes of diarrhea through the day.  Due to feeling ill, she missed her dialysis on Tuesday and Thursday.  She was able to complete her session on Saturday, however, she continued to have worsening generalized weakness, had pulse ox with oxygen saturation in the high 80s, requiring nasal cannula O2.  She appeared to have some symptoms suggesting gastroenteritis, but is now noted to have gram-positive bacteremia with associated sepsis and is improving.  Nephrology plans for hemodialysis by 7/26. Discussed case with ID/cardiology who recommends TEE which cannot be done until 7/28.   Assessment & Plan:   Principal Problem:   Sepsis due to undetermined organism [facility]) Active Problems:   Essential hypertension   Chronic systolic (congestive) heart failure (HCC)   DM (diabetes mellitus), type 2 with renal complications (HCC)   ESRD on hemodialysis (HCC)   Anemia due to end stage renal disease (HCC)   Abnormal liver function tests   Hypoxia   Bacteremia   Sepsis present on admission secondary to granulicatella adiacens -Related to strep variant -2D echocardiogram: grade 1 diastolic -Patient will need TEE, and Cardiology states that earliest this could be done is 7/28 -ID consult appreciated -Continue vancomycin with no further need for cefepime or flagyl per ID -Right femoral  fistula site without signs of overt infection -worsening mid-back pain-- thoracic MRI: Extensive multilevel degenerative spondylosis and facet arthrosis throughout the thoracic spine. Mild marrow edema involving the endplates at R5-1 through T8-9, favored to be degenerative in nature. Possible changes of early osteomyelitis discitis are difficult to exclude, although are felt to be unlikely.  Hypoxemia -This appears to be mild and possibly related to some atelectasis -Trial of incentive spirometry and weaning of supplemental oxygen  Mild transaminitis -ultrasound without signs of acute cholecystitis  Essential hypertension -Currently stable -Resumed on losartan and midodrine as she was taking at home  Chronic systolic heart failure -No signs of volume overload or decompensation at this time -Hemodialysis per nephrology to resume on 7/26  Type 2 diabetes -Carb modified diet -Hemoglobin A1c 8.0% -SSI -increased long acting 7/27  ESRD on HD TTS -Nephrology consulted for hemodialysis  Anemia due to end-stage renal disease -Continue to monitor repeat CBC with no overt bleeding identified -Erythropoietin per nephrology   DVT prophylaxis: Heparin Code Status: Full Family Communication: Discussed with husband on phone 7/26 Disposition Plan:  Status is: inpt  The patient will require care spanning > 2 midnights and should be moved to inpatient because: IV treatments appropriate due to intensity of illness or inability to take PO and Inpatient level of care appropriate due to severity of illness  Dispo: The patient is from: Home              Anticipated d/c is to: Home              Patient currently is not medically stable to d/c.- TEE planned for 7/28  Difficult to place patient No  Consultants:  Nephrology ID Dr. Gale [name] Cardiology   Antimicrobials:  Anti-infectives (From admission, onward)    Start     Dose/Rate Route Frequency Ordered Stop   01/20/21 1200  [MAR Hold]   vancomycin (VANCOREADY) IVPB 750 mg/150 mL        (MAR Hold since Thu 01/22/2021 at 1221.Hold Reason: Transfer to a Procedural area)   750 mg 150 mL/hr over 60 Minutes Intravenous Every T-Th-Sa (Hemodialysis) 01/17/21 2106     01/18/21 2100  ceFEPIme (MAXIPIME) 1 g in sodium chloride 0.9 % 100 mL IVPB  Status:  Discontinued        1 g 200 mL/hr over 30 Minutes Intravenous Every 24 hours 01/17/21 2106 01/19/21 1532   01/18/21 0245  metroNIDAZOLE (FLAGYL) IVPB 500 mg  Status:  Discontinued       Note to Pharmacy: Hyperbilirubinemia with fever and suspicious finding on CT scan abdomen.   500 mg 100 mL/hr over 60 Minutes Intravenous Every 8 hours 01/18/21 0238 01/19/21 1532   01/17/21 2015  ceFEPIme (MAXIPIME) 2 g in sodium chloride 0.9 % 100 mL IVPB        2 g 200 mL/hr over 30 Minutes Intravenous  Once 01/17/21 2005 01/17/21 2055   01/17/21 2015  vancomycin (VANCOREADY) IVPB 1500 mg/300 mL        1,500 mg 150 mL/hr over 120 Minutes Intravenous  Once 01/17/21 2005 01/17/21 2252       Subjective: Came to see patient x 2 -- off the floor   Objective: Vitals:   01/22/21 1030 01/22/21 1100 01/22/21 1224 01/22/21 1228  BP:  (!) 120/59 (!) 160/61   Pulse:  92    Resp: (!) 22 (!) 22 20   Temp:      TempSrc:      SpO2:    100%  Weight:      Height:        Intake/Output Summary (Last 24 hours) at 01/22/2021 1229 Last data filed at 01/22/2021 0758 Gross per 24 hour  Intake 230 ml  Output 0 ml  Net 230 ml   Filed Weights   01/20/21 0535 01/20/21 1616  Weight: 69.9 kg 69.9 kg    Examination:  Off the floor       Data Reviewed: I have personally reviewed following labs and imaging studies  CBC: Recent Labs  Lab 01/17/21 1805 01/18/21 0545 01/19/21 0046 01/20/21 0214 01/22/21 0020  WBC 9.4 8.0 8.7 14.0* 13.5*  NEUTROABS 7.1 5.2  --   --   --   HGB 11.0* 10.0* 8.9* 9.5* 9.0*  HCT 31.1* 29.2* 25.0* 26.5* 25.3*  MCV 82.7 84.6 81.4 80.1 80.3  PLT PLATELET CLUMPS NOTED  ON SMEAR, UNABLE TO ESTIMATE 79* 88* 141* 161   Basic Metabolic Panel: Recent Labs  Lab 01/17/21 1805 01/18/21 0545 01/19/21 0046 01/20/21 0214 01/22/21 0020  NA 137 133* 129* 129* 128*  K 4.1 3.9 3.8 4.0 4.2  CL 89* 89* 88* 87* 88*  CO2 30 28 26 25 25   GLUCOSE 159* 100* 346* 310* 291*  BUN 33* 38* 53* 64* 43*  CREATININE 6.43* 7.41* 8.83* 10.62* 8.36*  CALCIUM 8.9 8.7* 8.5* 8.4* 8.6*  MG  --   --   --  2.2  --    GFR: Estimated Creatinine Clearance: 5.5 mL/min (A) (by C-G formula based on SCr of 8.36 mg/dL (H)). Liver Function Tests: Recent Labs  Lab 01/17/21 1805 01/18/21 0545 01/20/21 0214  AST 41 31 20  ALT 67* 46* 30  ALKPHOS 242* 196* 203*  BILITOT 2.1* 1.8* 1.1  PROT 7.1 5.6* 5.6*  ALBUMIN 3.3* 2.6* 2.2*   No results for input(s): LIPASE, AMYLASE in the last 168 hours. No results for input(s): AMMONIA in the last 168 hours. Coagulation Profile: Recent Labs  Lab 01/17/21 1805  INR 1.0   Cardiac Enzymes: No results for input(s): CKTOTAL, CKMB, CKMBINDEX, TROPONINI in the last 168 hours. BNP (last 3 results) No results for input(s): PROBNP in the last 8760 hours. HbA1C: No results for input(s): HGBA1C in the last 72 hours.  CBG: Recent Labs  Lab 01/21/21 1255 01/21/21 1613 01/21/21 2033 01/22/21 0757 01/22/21 1045  GLUCAP 277* 345* 360* 276* 142*   Lipid Profile: No results for input(s): CHOL, HDL, LDLCALC, TRIG, CHOLHDL, LDLDIRECT in the last 72 hours. Thyroid Function Tests: No results for input(s): TSH, T4TOTAL, FREET4, T3FREE, THYROIDAB in the last 72 hours. Anemia Panel: No results for input(s): VITAMINB12, FOLATE, FERRITIN, TIBC, IRON, RETICCTPCT in the last 72 hours. Sepsis Labs: Recent Labs  Lab 01/17/21 1805 01/17/21 2239 01/18/21 0545 01/19/21 0046  PROCALCITON  --   --  43.88 36.36  LATICACIDVEN 2.3* 1.3  --   --     Recent Results (from the past 240 hour(s))  Resp Panel by RT-PCR (Flu A&B, Covid) Nasopharyngeal Swab      Status: None   Collection Time: 01/17/21  5:59 PM   Specimen: Nasopharyngeal Swab; Nasopharyngeal(NP) swabs in vial transport medium  Result Value Ref Range Status   SARS Coronavirus 2 by RT PCR NEGATIVE NEGATIVE Final    Comment: (NOTE) SARS-CoV-2 target nucleic acids are NOT DETECTED.  The SARS-CoV-2 RNA is generally detectable in upper respiratory specimens during the acute phase of infection. The lowest concentration of SARS-CoV-2 viral copies this assay can detect is 138 copies/mL. A negative result does not preclude SARS-Cov-2 infection and should not be used as the sole basis for treatment or other patient management decisions. A negative result may occur with  improper specimen collection/handling, submission of specimen other than nasopharyngeal swab, presence of viral mutation(s) within the areas targeted by this assay, and inadequate number of viral copies(<138 copies/mL). A negative result must be combined with clinical observations, patient history, and epidemiological information. The expected result is Negative.  Fact Sheet for Patients:  EntrepreneurPulse.com.au  Fact Sheet for Healthcare Providers:  IncredibleEmployment.be  This test is no t yet approved or cleared by the [place] FDA and  has been authorized for detection and/or diagnosis of SARS-CoV-2 by FDA under an Emergency Use Authorization (EUA). This EUA will remain  in effect (meaning this test can be used) for the duration of the COVID-19 declaration under Section 564(b)(1) of the Act, 21 U.S.C.section 360bbb-3(b)(1), unless the authorization is terminated  or revoked sooner.       Influenza A by PCR NEGATIVE NEGATIVE Final   Influenza B by PCR NEGATIVE NEGATIVE Final    Comment: (NOTE) The Xpert Xpress SARS-CoV-2/FLU/RSV plus assay is intended as an aid in the diagnosis of influenza from Nasopharyngeal swab specimens and should not be used as a sole basis  for treatment. Nasal washings and aspirates are unacceptable for Xpert Xpress SARS-CoV-2/FLU/RSV testing.  Fact Sheet for Patients: EntrepreneurPulse.com.au  Fact Sheet for Healthcare Providers: IncredibleEmployment.be  This test is not yet approved or cleared by the [place] FDA and has been authorized for detection and/or diagnosis of SARS-CoV-2 by FDA under an Emergency Use Authorization (  EUA). This EUA will remain in effect (meaning this test can be used) for the duration of the COVID-19 declaration under Section 564(b)(1) of the Act, 21 U.S.C. section 360bbb-3(b)(1), unless the authorization is terminated or revoked.  Performed at [facility], [place] [address] [place], [place] [address]   Blood culture (routine x 2)     Status: Abnormal (Preliminary result)   Collection Time: 01/17/21  8:46 PM   Specimen: BLOOD LEFT HAND  Result Value Ref Range Status   Specimen Description BLOOD LEFT HAND  Final   Special Requests   Final    BOTTLES DRAWN AEROBIC AND ANAEROBIC Blood Culture results may not be optimal due to an inadequate volume of blood received in culture bottles   Culture  Setup Time   Final    GRAM POSITIVE COCCI IN PAIRS AND CHAINS IN BOTH AEROBIC AND ANAEROBIC BOTTLES CRITICAL RESULT CALLED TO, READ BACK BY AND VERIFIED WITH: PHARM D J.ORIET ON [id number] AT 8315 BY E.PARRISH    Culture (A)  Final    GRANULICATELLA ADIACENS CULTURE REINCUBATED FOR BETTER GROWTH REPEATING Performed at [facility], [place] [address], [place], [place] [address]    Report Status PENDING  Incomplete  Blood culture (routine x 2)     Status: Abnormal (Preliminary result)   Collection Time: 01/17/21  8:46 PM   Specimen: BLOOD  Result Value Ref Range Status   Specimen Description BLOOD LEFT ANTECUBITAL  Final   Special Requests   Final    BOTTLES DRAWN AEROBIC AND ANAEROBIC Blood Culture results may not be optimal due to an inadequate  volume of blood received in culture bottles   Culture  Setup Time   Final    GRAM POSITIVE COCCI IN PAIRS AND CHAINS IN BOTH AEROBIC AND ANAEROBIC BOTTLES CRITICAL VALUE NOTED.  VALUE IS CONSISTENT WITH PREVIOUSLY REPORTED AND CALLED VALUE. Performed at [facility], [place] [address], [place], [place] [address]    Culture GRANULICATELLA ADIACENS (A)  Final   Report Status PENDING  Incomplete  Blood Culture ID Panel (Reflexed)     Status: None   Collection Time: 01/17/21  8:46 PM  Result Value Ref Range Status   Enterococcus faecalis NOT DETECTED NOT DETECTED Final   Enterococcus Faecium NOT DETECTED NOT DETECTED Final   Listeria monocytogenes NOT DETECTED NOT DETECTED Final   Staphylococcus species NOT DETECTED NOT DETECTED Final   Staphylococcus aureus (BCID) NOT DETECTED NOT DETECTED Final   Staphylococcus epidermidis NOT DETECTED NOT DETECTED Final   Staphylococcus lugdunensis NOT DETECTED NOT DETECTED Final   Streptococcus species NOT DETECTED NOT DETECTED Final   Streptococcus agalactiae NOT DETECTED NOT DETECTED Final   Streptococcus pneumoniae NOT DETECTED NOT DETECTED Final   Streptococcus pyogenes NOT DETECTED NOT DETECTED Final   A.calcoaceticus-baumannii NOT DETECTED NOT DETECTED Final   Bacteroides fragilis NOT DETECTED NOT DETECTED Final   Enterobacterales NOT DETECTED NOT DETECTED Final   Enterobacter cloacae complex NOT DETECTED NOT DETECTED Final   Escherichia coli NOT DETECTED NOT DETECTED Final   Klebsiella aerogenes NOT DETECTED NOT DETECTED Final   Klebsiella oxytoca NOT DETECTED NOT DETECTED Final   Klebsiella pneumoniae NOT DETECTED NOT DETECTED Final   Proteus species NOT DETECTED NOT DETECTED Final   Salmonella species NOT DETECTED NOT DETECTED Final   Serratia marcescens NOT DETECTED NOT DETECTED Final   Haemophilus influenzae NOT DETECTED NOT DETECTED Final   Neisseria meningitidis NOT DETECTED NOT DETECTED Final   Pseudomonas aeruginosa NOT  DETECTED  NOT DETECTED Final   Stenotrophomonas maltophilia NOT DETECTED NOT DETECTED Final   Candida albicans NOT DETECTED NOT DETECTED Final   Candida auris NOT DETECTED NOT DETECTED Final   Candida glabrata NOT DETECTED NOT DETECTED Final   Candida krusei NOT DETECTED NOT DETECTED Final   Candida parapsilosis NOT DETECTED NOT DETECTED Final   Candida tropicalis NOT DETECTED NOT DETECTED Final   Cryptococcus neoformans/gattii NOT DETECTED NOT DETECTED Final    Comment: Performed at [facility], [place] [address], [place], [place] [address]  Culture, blood (routine x 2)     Status: None (Preliminary result)   Collection Time: 01/19/21  8:56 AM   Specimen: BLOOD LEFT HAND  Result Value Ref Range Status   Specimen Description BLOOD LEFT HAND  Final   Special Requests   Final    BOTTLES DRAWN AEROBIC ONLY Blood Culture results may not be optimal due to an inadequate volume of blood received in culture bottles   Culture   Final    NO GROWTH 3 DAYS Performed at [facility], [place] [address], [place], [place] [address]    Report Status PENDING  Incomplete  Culture, blood (routine x 2)     Status: None (Preliminary result)   Collection Time: 01/19/21  8:58 AM   Specimen: BLOOD LEFT HAND  Result Value Ref Range Status   Specimen Description BLOOD LEFT HAND  Final   Special Requests   Final    BOTTLES DRAWN AEROBIC ONLY Blood Culture results may not be optimal due to an inadequate volume of blood received in culture bottles   Culture   Final    NO GROWTH 3 DAYS Performed at [facility], [place] [address], [place], [place] [address]    Report Status PENDING  Incomplete  MRSA Next Gen by PCR, Nasal     Status: None   Collection Time: 01/20/21  5:31 AM   Specimen: Nasal Mucosa; Nasal Swab  Result Value Ref Range Status   MRSA by PCR Next Gen NOT DETECTED NOT DETECTED Final    Comment: (NOTE) The GeneXpert MRSA Assay (FDA approved for NASAL specimens only), is one component of a  comprehensive MRSA colonization surveillance program. It is not intended to diagnose MRSA infection nor to guide or monitor treatment for MRSA infections. Test performance is not FDA approved in patients less than 54 years old. Performed at [facility], [place] [address] [place], [place] [address]          Radiology Studies: MR THORACIC SPINE WO CONTRAST  Result Date: 01/21/2021 CLINICAL DATA:  Initial evaluation for worsening mid back pain, history of bacteremia. EXAM: MRI THORACIC SPINE WITHOUT CONTRAST TECHNIQUE: Multiplanar, multisequence MR imaging of the thoracic spine was performed. No intravenous contrast was administered. COMPARISON:  None available. FINDINGS: Alignment: Dextroscoliosis with mild exaggeration of the normal thoracic kyphosis. No significant listhesis. Vertebrae: Susceptibility artifact from prior ACDF noted at C3-C7 on counter sequence. Vertebral body height maintained without acute or chronic fracture. Bone marrow signal intensity is diffusely decreased and heterogeneous on T1 weighted sequence, most likely related to history of end-stage renal disease and anemia. Extensive discogenic reactive endplate change present throughout the midthoracic spine extending from T6-7 through T11-12, favored to be degenerative in nature due to underlying scoliotic curvature. Associated marrow edema seen within the endplates adjacent to the T8-9 interspace, and to a lesser extent the T1-2, T6-7 and T7-8 interspaces. No definite edema within the intervening  discs themselves. Findings are favored to be discogenic and reactive in nature, although changes of early osteomyelitis discitis are difficult to exclude, and could be considered in the correct clinical setting. No other evidence for acute infection within the thoracic spine. No worrisome osseous lesions. Cord: Patchy signal abnormality seen involving the cervical spinal cord at approximately the level of C3-4, likely reflecting  chronic myelomalacia (series 15, image 16). Signal intensity within the visualized cord is otherwise within normal limits. Conus terminates at approximately the L1 level. No epidural collections. Paraspinal and other soft tissues: Paraspinous soft tissues demonstrate no acute finding. No definite paraspinous inflammatory changes seen about the T6-7 through T8-9 interspaces. No soft tissue collections. Small layering bilateral pleural effusions, left slightly larger than right. Disc levels: T1-2: Diffuse disc bulge with reactive endplate change, with left greater than right facet hypertrophy. No spinal stenosis. Mild to moderate bilateral foraminal narrowing. T2-3: Mild disc bulge with facet hypertrophy. No spinal stenosis. Foramina remain patent. T3-4: Small central disc extrusion with inferior migration. Mild facet hypertrophy. No significant spinal stenosis. Foramina remain patent. T4-5: Small right subarticular disc protrusion minimally indents the right ventral thecal sac. Associated mild reactive endplate spurring with bilateral facet hypertrophy. No spinal stenosis. Foramina remain patent. T5-6: Disc desiccation without significant disc bulge. Right greater than left facet hypertrophy. No spinal stenosis. Mild right foraminal narrowing. T6-7: Degenerative intervertebral disc space narrowing with diffuse disc osteophyte complex, eccentric to the left. Mild reactive marrow edema favored to be degenerative. Mild flattening of the left ventral cord without cord signal changes or significant spinal stenosis. Mild bilateral foraminal narrowing. T7-8: Advanced degenerative intervertebral disc space narrowing with diffuse disc osteophyte complex. Mild reactive marrow edema favored to be degenerative. Bilateral facet hypertrophy. Resultant mild spinal stenosis without cord impingement. Moderate right with mild left foraminal narrowing. T8-9: Advanced degenerative intervertebral disc space narrowing with diffuse disc  osteophyte complex. Bilateral facet hypertrophy. Resultant mild spinal stenosis without cord impingement. Moderate bilateral foraminal narrowing. T9-10: Advanced degenerative intervertebral disc space narrowing with essentially ankylosis of the T9 and T10 vertebral bodies. Posterior osseous ridging flattens and partially faces the ventral thecal sac. Bilateral facet hypertrophy. Resultant mild spinal stenosis without frank cord impingement. Mild right worse than left foraminal narrowing. T10-11: Advanced degenerative intervertebral disc space narrowing with ankylosis. Posterior endplate osseous ridging with flattening of the ventral thecal sac, eccentric to the left. Left worse than right facet degeneration. Mild spinal stenosis without cord impingement. Mild to moderate bilateral foraminal narrowing. T11-12: Advanced degenerative intervertebral disc space narrowing with ankylosis. Bilateral facet hypertrophy with ankylosis as well. No significant spinal stenosis. Mild to moderate right with mild left foraminal narrowing. T12-L1:  Negative interspace.  Mild facet hypertrophy.  No stenosis. IMPRESSION: 1. 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 Resultant mild diffuse spinal stenosis at T6-7 through T10-11, with associated mild to moderate multilevel foraminal narrowing as detailed above. 2. Mild marrow edema involving the endplates at V7-6 through T8-9, favored to be degenerative in nature. Possible changes of early osteomyelitis discitis are difficult to exclude, although are felt to be unlikely. If patient's symptoms should persist and/or worsen, a short interval follow-up MRI to evaluate for interval change may be helpful for further evaluation as warranted. No other evidence for acute infection within the thoracic spine. 3. Patchy signal abnormality involving the cervical spinal cord at the level of C3-4, most  consistent with chronic myelomalacia. 4. Small layering bilateral pleural  effusions, left greater than right. Electronically Signed   By: Jeannine Boga M.D.   On: 01/21/2021 00:42        Scheduled Meds:  [MAR Hold] aspirin EC  81 mg Oral Once per day on Mon Wed Fri   [facility] Hold] Chlorhexidine Gluconate Cloth  6 each Topical Daily   [MAR Hold] darbepoetin (ARANESP) injection - DIALYSIS  60 mcg Intravenous Q Thu-HD   [MAR Hold] simvastatin  40 mg Oral QHS   And   [MAR Hold] ezetimibe  10 mg Oral QHS   [MAR Hold] heparin injection (subcutaneous)  5,000 Units Subcutaneous Q8H   [MAR Hold] insulin aspart  0-9 Units Subcutaneous TID WC   [MAR Hold] insulin glargine  14 Units Subcutaneous Daily   [MAR Hold] lidocaine  1 patch Transdermal Q24H   [MAR Hold] LORazepam  0.5 mg Intravenous Once   [MAR Hold] losartan  12.5 mg Oral Daily   [MAR Hold] midodrine  10 mg Oral Once per day on Tue Thu Sat   [facility] Hold] mometasone-formoterol  2 puff Inhalation BID   Continuous Infusions:  sodium chloride 20 mL/hr at 01/22/21 0701   [MAR Hold] vancomycin 750 mg (01/22/21 1048)     LOS: 2 days    Time spent: 15 minutes    Geradine Girt, DO [facility]  If 7PM-7AM, please contact night-coverage [email or web address] 01/22/2021, 12:29 PM

## 2021-01-22 NOTE — Progress Notes (Signed)
  Echocardiogram Echocardiogram Transesophageal has been performed.  Michiel Cowboy 01/22/2021, 2:31 PM

## 2021-01-22 NOTE — Anesthesia Preprocedure Evaluation (Signed)
Anesthesia Evaluation  Patient identified by MRN, date of birth, ID band Patient awake    Reviewed: Allergy & Precautions, H&P , NPO status , Patient's Chart, lab work & pertinent test results  Airway Mallampati: II   Neck ROM: full    Dental   Pulmonary asthma ,    breath sounds clear to auscultation       Cardiovascular hypertension, +CHF  + dysrhythmias  Rhythm:regular Rate:Normal     Neuro/Psych  Headaches, PSYCHIATRIC DISORDERS Anxiety Depression  Neuromuscular disease    GI/Hepatic GERD  ,  Endo/Other  diabetes, Type 2  Renal/GU ESRF and DialysisRenal disease     Musculoskeletal  (+) Arthritis ,   Abdominal   Peds  Hematology  (+) Blood dyscrasia, anemia ,   Anesthesia Other Findings   Reproductive/Obstetrics                             Anesthesia Physical Anesthesia Plan  ASA: 3  Anesthesia Plan: MAC   Post-op Pain Management:    Induction: Intravenous  PONV Risk Score and Plan: 2 and Propofol infusion and Treatment may vary due to age or medical condition  Airway Management Planned: Nasal Cannula  Additional Equipment:   Intra-op Plan:   Post-operative Plan:   Informed Consent: I have reviewed the patients History and Physical, chart, labs and discussed the procedure including the risks, benefits and alternatives for the proposed anesthesia with the patient or authorized representative who has indicated his/her understanding and acceptance.     Dental advisory given  Plan Discussed with: Anesthesiologist, Surgeon and CRNA  Anesthesia Plan Comments:         Anesthesia Quick Evaluation

## 2021-01-23 ENCOUNTER — Inpatient Hospital Stay (HOSPITAL_COMMUNITY): Payer: Medicare Other

## 2021-01-23 DIAGNOSIS — R7881 Bacteremia: Secondary | ICD-10-CM | POA: Diagnosis not present

## 2021-01-23 DIAGNOSIS — G8929 Other chronic pain: Secondary | ICD-10-CM

## 2021-01-23 DIAGNOSIS — Z992 Dependence on renal dialysis: Secondary | ICD-10-CM | POA: Diagnosis not present

## 2021-01-23 DIAGNOSIS — M546 Pain in thoracic spine: Secondary | ICD-10-CM | POA: Diagnosis not present

## 2021-01-23 DIAGNOSIS — N186 End stage renal disease: Secondary | ICD-10-CM | POA: Diagnosis not present

## 2021-01-23 LAB — GLUCOSE, CAPILLARY
Glucose-Capillary: 243 mg/dL — ABNORMAL HIGH (ref 70–99)
Glucose-Capillary: 374 mg/dL — ABNORMAL HIGH (ref 70–99)
Glucose-Capillary: 284 mg/dL — ABNORMAL HIGH (ref 70–99)
Glucose-Capillary: 174 mg/dL — ABNORMAL HIGH (ref 70–99)

## 2021-01-23 MED ORDER — DOCUSATE SODIUM 100 MG PO CAPS
100.0000 mg | ORAL_CAPSULE | Freq: Two times a day (BID) | ORAL | Status: DC
  Administered 2021-01-23 – 2021-01-24 (×3): 100 mg via ORAL
  Filled 2021-01-23 (×4): qty 1

## 2021-01-23 MED ORDER — POLYETHYLENE GLYCOL 3350 17 G PO PACK: 17.0000 g | PACK | Freq: Every day | ORAL | Status: DC | PRN

## 2021-01-23 MED ORDER — INSULIN GLARGINE 100 UNIT/ML ~~LOC~~ SOLN
18.0000 [IU] | Freq: Every day | SUBCUTANEOUS | Status: DC
  Filled 2021-01-23: qty 0.18

## 2021-01-23 MED ORDER — INSULIN ASPART 100 UNIT/ML IJ SOLN: 4.0000 [IU] | Freq: Three times a day (TID) | INTRAMUSCULAR | Status: DC

## 2021-01-23 MED ORDER — FERRIC CITRATE 1 GM 210 MG(FE) PO TABS
210.0000 mg | ORAL_TABLET | Freq: Three times a day (TID) | ORAL | Status: DC
  Administered 2021-01-23 – 2021-01-25 (×5): 210 mg via ORAL
  Filled 2021-01-23 (×5): qty 1

## 2021-01-23 MED ORDER — LIDOCAINE 5 % EX PTCH
2.0000 | MEDICATED_PATCH | CUTANEOUS | Status: DC
  Administered 2021-01-23 – 2021-01-25 (×3): 2 via TRANSDERMAL
  Filled 2021-01-23 (×3): qty 2

## 2021-01-23 MED ORDER — OXYCODONE HCL 5 MG PO TABS
5.0000 mg | ORAL_TABLET | Freq: Four times a day (QID) | ORAL | Status: DC | PRN
  Administered 2021-01-23 – 2021-01-25 (×6): 5 mg via ORAL
  Filled 2021-01-23 (×6): qty 1

## 2021-01-23 MED ORDER — INSULIN ASPART 100 UNIT/ML IJ SOLN
8.0000 [IU] | Freq: Three times a day (TID) | INTRAMUSCULAR | Status: DC
  Administered 2021-01-23 – 2021-01-25 (×5): 8 [IU] via SUBCUTANEOUS

## 2021-01-23 MED ORDER — CALCITRIOL 0.25 MCG PO CAPS
1.5000 ug | ORAL_CAPSULE | ORAL | Status: DC
  Administered 2021-01-24: 1.5 ug via ORAL
  Filled 2021-01-23: qty 6

## 2021-01-23 NOTE — Evaluation (Signed)
Occupational Therapy Evaluation Patient Details Name: Belinda [name] MRN: [id number] DOB: 1951/03/21 Today's Date: 01/23/2021    History of Present Illness Pt is a 70 y/o female admitted 7/24 secondary to increased nausea and vomiting. Found to have Sepsis secondary to granulicatella adiacens. PMH includes ESRD on HD, DM, HTN, and CHF.   Clinical Impression   Belinda [name] is a 70 year old woman who presents today near her baseline - demonstrating ability to perform bed transfer and in room ambulation with min guard. Patient did use Rw today for safety - though she typically doesn't - because "I haven't walked much in 4 days." Patient able to stand from low bed height, walk to sink and perform grooming task. Patient exhibits functional strength and has chronic right hand coordination deficits. Patient reports feeling close to normal but overall min guard for ambulation and ADLs. Patient's movements slow but controlled. Patient will benefit from skilled OT services while in hospital to improve deficits in order to return home. Expect no OT needs at discharge.      Follow Up Recommendations  No OT follow up    Equipment Recommendations  None recommended by OT    Recommendations for Other Services       Precautions / Restrictions Precautions Precautions: Fall Restrictions Weight Bearing Restrictions: No      Mobility Bed Mobility Overal bed mobility: Needs Assistance Bed Mobility: Supine to Sit     Supine to sit: Supervision;HOB elevated     General bed mobility comments: HOB up, increased time and use of bed rail to transfer to side of bed. Reports she sleeps in a lift chair at home.    Transfers Overall transfer level: Needs assistance Equipment used: Rolling walker (2 wheeled) Transfers: Sit to/from Stand   Stand pivot transfers: Min guard       General transfer comment: Min guard with RW to stand and ambulate to sink. Approx 5 min standing at sink to wash  face. Patient propped elbows on sink. No overt loss of balance.    Balance Overall balance assessment: Needs assistance Sitting-balance support: No upper extremity supported Sitting balance-Leahy Scale: Fair Sitting balance - Comments: propped on right elbow - due to chronic back pain   Standing balance support: During functional activity Standing balance-Leahy Scale: Fair Standing balance comment: used walker today for safety, able to take hands off walker for functional task                           ADL either performed or assessed with clinical judgement   ADL Overall ADL's : At baseline                                             Vision Patient Visual Report: No change from baseline       Perception     Praxis      Pertinent Vitals/Pain Pain Assessment: Faces Faces Pain Scale: Hurts a little bit Pain Location: chronic back pain Pain Descriptors / Indicators: Aching;Guarding;Grimacing Pain Intervention(s): Limited activity within patient's tolerance     Hand Dominance Left   Extremity/Trunk Assessment Upper Extremity Assessment Upper Extremity Assessment: RUE deficits/detail;LUE deficits/detail RUE Deficits / Details: WFL ROM, functional strength, 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 RUE Sensation:  (numbness in hand) RUE Coordination: decreased fine motor LUE Deficits /  Details: WFL ROM and strength LUE Sensation: WNL LUE Coordination: WNL   Lower Extremity Assessment Lower Extremity Assessment: Defer to PT evaluation   Cervical / Trunk Assessment Cervical / Trunk Assessment: Kyphotic   Communication Communication Communication: No difficulties   Cognition Arousal/Alertness: Awake/alert Behavior During Therapy: WFL for tasks assessed/performed Overall Cognitive Status: Within Functional Limits for tasks assessed                                     General Comments       Exercises      Shoulder Instructions      Home Living Family/patient expects to be discharged to:: Private residence Living Arrangements: Spouse/significant other Available Help at Discharge: Family Type of Home: House Home Access: Stairs to enter Technical brewer of Steps: 1 Entrance Stairs-Rails: None Home Layout: One level     Bathroom Shower/Tub:  (walk in tub)   Bathroom Toilet: Standard     Home Equipment: Environmental consultant - 2 wheels;Cane - single point;Bedside commode;Wheelchair - manual   Additional Comments: Has a lift chair that she sleeps in.      Prior Functioning/Environment Level of Independence: Independent        Comments: Pt reports being independent with ADL and mobility task - performing light cooking as well        OT Problem List: Decreased activity tolerance;Impaired balance (sitting and/or standing);Impaired UE functional use      OT Treatment/Interventions: Self-care/ADL training;Therapeutic activities;Balance training    OT Goals(Current goals can be found in the care plan section) Acute Rehab OT Goals Patient Stated Goal: to go home OT Goal Formulation: All assessment and education complete, DC therapy  OT Frequency: Min 2X/week   Barriers to D/C:            Co-evaluation              AM-PAC OT "6 Clicks" Daily Activity     Outcome Measure Help from another person eating meals?: None Help from another person taking care of personal grooming?: A Little Help from another person toileting, which includes using toliet, bedpan, or urinal?: A Little Help from another person bathing (including washing, rinsing, drying)?: A Little Help from another person to put on and taking off regular upper body clothing?: A Little Help from another person to put on and taking off regular lower body clothing?: A Little 6 Click Score: 19   End of Session Equipment Utilized During Treatment: Rolling walker;Gait belt Nurse Communication:  (okay to see. Patient  inquiring about breakfast.)  Activity Tolerance: Patient tolerated treatment well Patient left: in chair;with call bell/phone within reach;with chair alarm set  OT Visit Diagnosis: Muscle weakness (generalized) (M62.81)                Time: 7867-6720 OT Time Calculation (min): 25 min Charges:  OT General Charges $OT Visit: 1 Visit OT Evaluation $OT Eval Low Complexity: 1 Low  Indiana Pechacek, OTR/[facility] [place]  Office [phone] Pager: [place] 01/23/2021, 8:51 AM

## 2021-01-23 NOTE — Progress Notes (Signed)
SATURATION QUALIFICATIONS: (This note is used to comply with regulatory documentation for home oxygen) ° °Patient Saturations on Room Air at Rest = 99% ° °Patient Saturations on Room Air while Ambulating = 96% ° °

## 2021-01-23 NOTE — Progress Notes (Signed)
Physical Therapy Treatment Patient Details Name: Belinda [name] MRN: [id number] DOB: [birth year] Today's Date: 01/23/2021    History of Present Illness Pt is a 70 y/o female admitted 7/24 secondary to increased nausea and vomiting. Found to have Sepsis secondary to granulicatella adiacens. PMH includes ESRD on HD, DM, HTN, and CHF.    PT Comments    Pt making excellent progress. She ambulate 300' with RW and min guard progressed to supervision.  Did ambulate at slow speed but tolerated well.  She was on RA throughout session with sats 96-99%, No O2 needs.  Pt has support from spouse at home and reports will use RW.  Updated recommendation to home with HHPT.   Follow Up Recommendations  Home health PT;Supervision for mobility/OOB     Equipment Recommendations  None recommended by PT    Recommendations for Other Services       Precautions / Restrictions Precautions Precautions: Fall Restrictions Weight Bearing Restrictions: No    Mobility  Bed Mobility Overal bed mobility: Needs Assistance Bed Mobility: Supine to Sit     Supine to sit: Supervision;HOB elevated     General bed mobility comments: in chair at arrival    Transfers Overall transfer level: Needs assistance Equipment used: Rolling walker (2 wheeled) Transfers: Sit to/from Stand Sit to Stand: Min guard Stand pivot transfers: Min guard       General transfer comment: Min guard to stand with RW.  Ambulation/Gait Ambulation/Gait assistance: Min guard;Supervision Gait Distance (Feet): 300 Feet Assistive device: Rolling walker (2 wheeled) Gait Pattern/deviations: Step-to pattern;Decreased stride length;Trunk flexed Gait velocity: decreased   General Gait Details: Started with min guard and progressed to supervision.  Did ambulate at slow speed - would nearly stop for a few steps and then walk 10-20' with improved pace.  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  Took at least 20 min to do 300'.  Min cues for posture and monitoring fatigue.  Pt on RA with sats 96-99%   Stairs             Wheelchair Mobility    Modified Rankin (Stroke Patients Only)       Balance Overall balance assessment: Needs assistance Sitting-balance support: No upper extremity supported Sitting balance-Leahy Scale: Good Sitting balance - Comments: .   Standing balance support: Bilateral upper extremity supported;No upper extremity supported Standing balance-Leahy Scale: Fair Standing balance comment: used walker today for safety, able to take hands off walker for functional task                            Cognition Arousal/Alertness: Awake/alert Behavior During Therapy: WFL for tasks assessed/performed Overall Cognitive Status: Within Functional Limits for tasks assessed                                 General Comments: Very pleasant and motivated; recalls safety recommendations regarding RW use without cues      Exercises      General Comments General comments (skin integrity, edema, etc.): On RA with sats 96-99% during treatment      Pertinent Vitals/Pain Pain Assessment: No/denies pain Faces Pain Scale: Hurts a little bit Pain Location: chronic back pain Pain Descriptors / Indicators: Aching;Guarding;Grimacing Pain Intervention(s): Limited activity within patient's tolerance    Home Living Family/patient expects to be discharged to:: Private  residence Living Arrangements: Spouse/significant other Available Help at Discharge: Family Type of Home: House Home Access: Stairs to enter Entrance Stairs-Rails: None Home Layout: One level Home Equipment: Environmental consultant - 2 wheels;Cane - single point;Bedside commode;Wheelchair - manual Additional Comments: Has a lift chair that she sleeps in.    Prior Function Level of Independence: Independent      Comments: Pt reports being independent with ADL and mobility task - performing  light cooking as well   PT Goals (current goals can now be found in the care plan section) Acute Rehab PT Goals Patient Stated Goal: to go home PT Goal Formulation: With patient Time For Goal Achievement: 02/04/21 Potential to Achieve Goals: Good Progress towards PT goals: Progressing toward goals    Frequency    Min 3X/week      PT Plan Discharge plan needs to be updated    Co-evaluation              AM-PAC PT "6 Clicks" Mobility   Outcome Measure  Help needed turning from your back to your side while in a flat bed without using bedrails?: A Little Help needed moving from lying on your back to sitting on the side of a flat bed without using bedrails?: A Little (sleeps in chair at home) Help needed moving to and from a bed to a chair (including a wheelchair)?: A Little Help needed standing up from a chair using your arms (e.g., wheelchair or bedside chair)?: A Little Help needed to walk in hospital room?: A Little Help needed climbing 3-5 steps with a railing? : A Little 6 Click Score: 18    End of Session Equipment Utilized During Treatment: Gait belt Activity Tolerance: Patient tolerated treatment well Patient left: in chair;with call bell/phone within reach;with family/visitor present Nurse Communication: Mobility status PT Visit Diagnosis: Unsteadiness on feet (R26.81);Muscle weakness (generalized) (M62.81);Difficulty in walking, not elsewhere classified (R26.2)     Time: 7672-0947 PT Time Calculation (min) (ACUTE ONLY): 27 min  Charges:  $Gait Training: 23-37 mins                     Abran Richard, PT Acute Rehab Services Pager [phone] [facility] [place] 01/23/2021, 12:16 PM

## 2021-01-23 NOTE — Care Management Important Message (Signed)
Important Message  Patient Details  Name: Belinda [name] MRN: [id number] Date of Birth: 20-Mar-1951   Medicare Important Message Given:  Yes     Memory Argue 01/23/2021, 5:25 PM

## 2021-01-23 NOTE — Progress Notes (Addendum)
PROGRESS NOTE    Belinda [name]  XNA:[id number] DOB: Apr 16, 1951 DOA: 01/17/2021 PCP: Reynold Bowen, MD   Brief Narrative:   Belinda [name] is a 70 y.o. female with medical history significant of chronic systolic heart failure, ESRD anemia, ESRD on hemodialysis, anxiety/depression, chronic cervical and lumbar back pain, GERD, history of unspecified MRSA infection.   She states that she has been ill for about a week, had a sandwich from [place] [employer] [place] [employer] on Sunday then subsequently started to have multiple episodes of vomiting.  Then on Wednesday, finished her leftover sandwich, then continued to have multiple episodes of diarrhea through the day.  Due to feeling ill, she missed her dialysis on Tuesday and Thursday.  She was able to complete her session on Saturday, however, she continued to have worsening generalized weakness, had pulse ox with oxygen saturation in the high 80s, requiring nasal cannula O2.  She appeared to have some symptoms suggesting gastroenteritis, but is now noted to have gram-positive bacteremia with associated sepsis and is improving.  S/p TEE on 7/28.  Plan for 10 days of IV vanc and outpatient ID Follow up   Assessment & Plan:   Principal Problem:   Sepsis due to undetermined organism [facility]) Active Problems:   Essential hypertension   Chronic systolic (congestive) heart failure (HCC)   DM (diabetes mellitus), type 2 with renal complications (HCC)   ESRD on hemodialysis (HCC)   Anemia due to end stage renal disease (HCC)   Abnormal liver function tests   Hypoxia   Bacteremia   Sepsis present on admission secondary to granulicatella adiacens -Related to strep variant -2D echocardiogram: grade 1 diastolic -s/p TEE w/o endocarditis -ID consult appreciated: Would treat her for total 10 days vancomycin from 7/25, and follow up 2-3 weeks off antibiotics -worsening mid-back pain-- thoracic MRI: Extensive multilevel degenerative spondylosis and facet arthrosis  throughout the thoracic spine. Mild marrow edema involving the endplates at R4-2 through T8-9, favored to be degenerative in nature. Possible changes of early osteomyelitis discitis are difficult to exclude, although are felt to be unlikely.  Hypoxemia -resolved  Mild transaminitis -ultrasound without signs of acute cholecystitis  Essential hypertension -not sure how accurate her BP are as it is being checked in her ankle/lower leg  Chronic systolic heart failure -No signs of volume overload or decompensation at this time -Hemodialysis per nephrology  Type 2 diabetes -Carb modified diet -Hemoglobin A1c 8.0% -SSI -increased long acting   ESRD on HD TTS -Nephrology consulted for hemodialysis  Anemia due to end-stage renal disease -Continue to monitor repeat CBC with no overt bleeding identified -Erythropoietin per nephrology   DVT prophylaxis: Heparin Code Status: Full Disposition Plan:  Status is: inpt  The patient will require care spanning > 2 midnights and should be moved to inpatient because: IV treatments appropriate due to intensity of illness or inability to take PO and Inpatient level of care appropriate due to severity of illness  Dispo: The patient is from: Home              Anticipated d/c is to: Home              Patient currently is not medically stable to d/c. PT Eval, HD in AM and home after?   Difficult to place patient No  Consultants:  Nephrology ID Dr. [facility]   Antimicrobials:  Anti-infectives (From admission, onward)    Start     Dose/Rate Route Frequency Ordered Stop   01/20/21 1200  vancomycin (  VANCOREADY) IVPB 750 mg/150 mL        750 mg 150 mL/hr over 60 Minutes Intravenous Every T-Th-Sa (Hemodialysis) 01/17/21 2106     01/18/21 2100  ceFEPIme (MAXIPIME) 1 g in sodium chloride 0.9 % 100 mL IVPB  Status:  Discontinued        1 g 200 mL/hr over 30 Minutes Intravenous Every 24 hours 01/17/21 2106 01/19/21 1532   01/18/21 0245   metroNIDAZOLE (FLAGYL) IVPB 500 mg  Status:  Discontinued       Note to Pharmacy: Hyperbilirubinemia with fever and suspicious finding on CT scan abdomen.   500 mg 100 mL/hr over 60 Minutes Intravenous Every 8 hours 01/18/21 0238 01/19/21 1532   01/17/21 2015  ceFEPIme (MAXIPIME) 2 g in sodium chloride 0.9 % 100 mL IVPB        2 g 200 mL/hr over 30 Minutes Intravenous  Once 01/17/21 2005 01/17/21 2055   01/17/21 2015  vancomycin (VANCOREADY) IVPB 1500 mg/300 mL        1,500 mg 150 mL/hr over 120 Minutes Intravenous  Once 01/17/21 2005 01/17/21 2252       Subjective: Came to see patient x 2 -- off the floor   Objective: Vitals:   01/22/21 1958 01/22/21 2343 01/23/21 0339 01/23/21 0742  BP:  (!) 178/73 (!) 176/73 (!) 175/74  Pulse: 97 99 (!) 103 100  Resp: 18 18 19 20   Temp:  98.4 F (36.9 C) 98.7 F (37.1 C) 98.5 F (36.9 C)  TempSrc:  Oral Oral Oral  SpO2: 99% 99% 100% 99%  Weight:      Height:        Intake/Output Summary (Last 24 hours) at 01/23/2021 1131 Last data filed at 01/22/2021 1807 Gross per 24 hour  Intake 222.16 ml  Output 2784 ml  Net -2561.84 ml   Filed Weights   01/20/21 0535 01/20/21 1616  Weight: 69.9 kg 69.9 kg    Examination:   General: Appearance:    Obese female in no acute distress- standing up taking a bath     Lungs:      respirations unlabored  Heart:    Tachycardic.   MS:   All extremities are intact.    Neurologic:   Awake, alert, oriented x 3. No apparent focal neurological           defect.         Data Reviewed: I have personally reviewed following labs and imaging studies  CBC: Recent Labs  Lab 01/17/21 1805 01/18/21 0545 01/19/21 0046 01/20/21 0214 01/22/21 0020  WBC 9.4 8.0 8.7 14.0* 13.5*  NEUTROABS 7.1 5.2  --   --   --   HGB 11.0* 10.0* 8.9* 9.5* 9.0*  HCT 31.1* 29.2* 25.0* 26.5* 25.3*  MCV 82.7 84.6 81.4 80.1 80.3  PLT PLATELET CLUMPS NOTED ON SMEAR, UNABLE TO ESTIMATE 79* 88* 141* 536   Basic Metabolic  Panel: Recent Labs  Lab 01/17/21 1805 01/18/21 0545 01/19/21 0046 01/20/21 0214 01/22/21 0020 01/22/21 0747  NA 137 133* 129* 129* 128*  --   K 4.1 3.9 3.8 4.0 4.2  --   CL 89* 89* 88* 87* 88*  --   CO2 30 28 26 25 25   --   GLUCOSE 159* 100* 346* 310* 291*  --   BUN 33* 38* 53* 64* 43*  --   CREATININE 6.43* 7.41* 8.83* 10.62* 8.36*  --   CALCIUM 8.9 8.7* 8.5* 8.4* 8.6*  --  MG  --   --   --  2.2  --   --   PHOS  --   --   --   --   --  4.4   GFR: Estimated Creatinine Clearance: 5.5 mL/min (A) (by C-G formula based on SCr of 8.36 mg/dL (H)). Liver Function Tests: Recent Labs  Lab 01/17/21 1805 01/18/21 0545 01/20/21 0214  AST 41 31 20  ALT 67* 46* 30  ALKPHOS 242* 196* 203*  BILITOT 2.1* 1.8* 1.1  PROT 7.1 5.6* 5.6*  ALBUMIN 3.3* 2.6* 2.2*   No results for input(s): LIPASE, AMYLASE in the last 168 hours. No results for input(s): AMMONIA in the last 168 hours. Coagulation Profile: Recent Labs  Lab 01/17/21 1805  INR 1.0   Cardiac Enzymes: No results for input(s): CKTOTAL, CKMB, CKMBINDEX, TROPONINI in the last 168 hours. BNP (last 3 results) No results for input(s): PROBNP in the last 8760 hours. HbA1C: No results for input(s): HGBA1C in the last 72 hours.  CBG: Recent Labs  Lab 01/22/21 1045 01/22/21 1236 01/22/21 1609 01/22/21 2045 01/23/21 0742  GLUCAP 142* 126* 191* 258* 243*   Lipid Profile: No results for input(s): CHOL, HDL, LDLCALC, TRIG, CHOLHDL, LDLDIRECT in the last 72 hours. Thyroid Function Tests: No results for input(s): TSH, T4TOTAL, FREET4, T3FREE, THYROIDAB in the last 72 hours. Anemia Panel: No results for input(s): VITAMINB12, FOLATE, FERRITIN, TIBC, IRON, RETICCTPCT in the last 72 hours. Sepsis Labs: Recent Labs  Lab 01/17/21 1805 01/17/21 2239 01/18/21 0545 01/19/21 0046  PROCALCITON  --   --  43.88 36.36  LATICACIDVEN 2.3* 1.3  --   --     Recent Results (from the past 240 hour(s))  Resp Panel by RT-PCR (Flu A&B,  Covid) Nasopharyngeal Swab     Status: None   Collection Time: 01/17/21  5:59 PM   Specimen: Nasopharyngeal Swab; Nasopharyngeal(NP) swabs in vial transport medium  Result Value Ref Range Status   SARS Coronavirus 2 by RT PCR NEGATIVE NEGATIVE Final    Comment: (NOTE) SARS-CoV-2 target nucleic acids are NOT DETECTED.  The SARS-CoV-2 RNA is generally detectable in upper respiratory specimens during the acute phase of infection. The lowest concentration of SARS-CoV-2 viral copies this assay can detect is 138 copies/mL. A negative result does not preclude SARS-Cov-2 infection and should not be used as the sole basis for treatment or other patient management decisions. A negative result may occur with  improper specimen collection/handling, submission of specimen other than nasopharyngeal swab, presence of viral mutation(s) within the areas targeted by this assay, and inadequate number of viral copies(<138 copies/mL). A negative result must be combined with clinical observations, patient history, and epidemiological information. The expected result is Negative.  Fact Sheet for Patients:  EntrepreneurPulse.com.au  Fact Sheet for Healthcare Providers:  IncredibleEmployment.be  This test is no t yet approved or cleared by the [place] FDA and  has been authorized for detection and/or diagnosis of SARS-CoV-2 by FDA under an Emergency Use Authorization (EUA). This EUA will remain  in effect (meaning this test can be used) for the duration of the COVID-19 declaration under Section 564(b)(1) of the Act, 21 U.S.C.section 360bbb-3(b)(1), unless the authorization is terminated  or revoked sooner.       Influenza A by PCR NEGATIVE NEGATIVE Final   Influenza B by PCR NEGATIVE NEGATIVE Final    Comment: (NOTE) The Xpert Xpress SARS-CoV-2/FLU/RSV plus assay is intended as an aid in the diagnosis of influenza from Nasopharyngeal swab  specimens and should  not be used as a sole basis for treatment. Nasal washings and aspirates are unacceptable for Xpert Xpress SARS-CoV-2/FLU/RSV testing.  Fact Sheet for Patients: EntrepreneurPulse.com.au  Fact Sheet for Healthcare Providers: IncredibleEmployment.be  This test is not yet approved or cleared by the [place] FDA and has been authorized for detection and/or diagnosis of SARS-CoV-2 by FDA under an Emergency Use Authorization (EUA). This EUA will remain in effect (meaning this test can be used) for the duration of the COVID-19 declaration under Section 564(b)(1) of the Act, 21 U.S.C. section 360bbb-3(b)(1), unless the authorization is terminated or revoked.  Performed at [facility], [place] [address], [place], [place] [address]   Blood culture (routine x 2)     Status: Abnormal (Preliminary result)   Collection Time: 01/17/21  8:46 PM   Specimen: BLOOD LEFT HAND  Result Value Ref Range Status   Specimen Description BLOOD LEFT HAND  Final   Special Requests   Final    BOTTLES DRAWN AEROBIC AND ANAEROBIC Blood Culture results may not be optimal due to an inadequate volume of blood received in culture bottles   Culture  Setup Time   Final    GRAM POSITIVE COCCI IN PAIRS AND CHAINS IN BOTH AEROBIC AND ANAEROBIC BOTTLES CRITICAL RESULT CALLED TO, READ BACK BY AND VERIFIED WITH: PHARM D J.ORIET ON [id number] AT [id number] BY E.PARRISH    Culture (A)  Final    GRANULICATELLA ADIACENS ORG POOR GROWTH CULTURE REINCUBATED FOR BETTER GROWTH Performed at [facility], [place] [address], [place], [place] [address]    Report Status PENDING  Incomplete  Blood culture (routine x 2)     Status: Abnormal (Preliminary result)   Collection Time: 01/17/21  8:46 PM   Specimen: BLOOD  Result Value Ref Range Status   Specimen Description BLOOD LEFT ANTECUBITAL  Final   Special Requests   Final    BOTTLES DRAWN AEROBIC AND ANAEROBIC Blood Culture results may not  be optimal due to an inadequate volume of blood received in culture bottles   Culture  Setup Time   Final    GRAM POSITIVE COCCI IN PAIRS AND CHAINS IN BOTH AEROBIC AND ANAEROBIC BOTTLES CRITICAL VALUE NOTED.  VALUE IS CONSISTENT WITH PREVIOUSLY REPORTED AND CALLED VALUE. Performed at [facility], [place] [address] [place], [place] [address]    Culture GRANULICATELLA ADIACENS (A)  Final   Report Status PENDING  Incomplete  Blood Culture ID Panel (Reflexed)     Status: None   Collection Time: 01/17/21  8:46 PM  Result Value Ref Range Status   Enterococcus faecalis NOT DETECTED NOT DETECTED Final   Enterococcus Faecium NOT DETECTED NOT DETECTED Final   Listeria monocytogenes NOT DETECTED NOT DETECTED Final   Staphylococcus species NOT DETECTED NOT DETECTED Final   Staphylococcus aureus (BCID) NOT DETECTED NOT DETECTED Final   Staphylococcus epidermidis NOT DETECTED NOT DETECTED Final   Staphylococcus lugdunensis NOT DETECTED NOT DETECTED Final   Streptococcus species NOT DETECTED NOT DETECTED Final   Streptococcus agalactiae NOT DETECTED NOT DETECTED Final   Streptococcus pneumoniae NOT DETECTED NOT DETECTED Final   Streptococcus pyogenes NOT DETECTED NOT DETECTED Final   A.calcoaceticus-baumannii NOT DETECTED NOT DETECTED Final   Bacteroides fragilis NOT DETECTED NOT DETECTED Final   Enterobacterales NOT DETECTED NOT DETECTED Final   Enterobacter cloacae complex NOT DETECTED NOT DETECTED Final   Escherichia coli NOT DETECTED NOT DETECTED Final   Klebsiella aerogenes NOT DETECTED NOT  DETECTED Final   Klebsiella oxytoca NOT DETECTED NOT DETECTED Final   Klebsiella pneumoniae NOT DETECTED NOT DETECTED Final   Proteus species NOT DETECTED NOT DETECTED Final   Salmonella species NOT DETECTED NOT DETECTED Final   Serratia marcescens NOT DETECTED NOT DETECTED Final   Haemophilus influenzae NOT DETECTED NOT DETECTED Final   Neisseria meningitidis NOT DETECTED NOT DETECTED Final    Pseudomonas aeruginosa NOT DETECTED NOT DETECTED Final   Stenotrophomonas maltophilia NOT DETECTED NOT DETECTED Final   Candida albicans NOT DETECTED NOT DETECTED Final   Candida auris NOT DETECTED NOT DETECTED Final   Candida glabrata NOT DETECTED NOT DETECTED Final   Candida krusei NOT DETECTED NOT DETECTED Final   Candida parapsilosis NOT DETECTED NOT DETECTED Final   Candida tropicalis NOT DETECTED NOT DETECTED Final   Cryptococcus neoformans/gattii NOT DETECTED NOT DETECTED Final    Comment: Performed at [facility], [place] [address] [place], [place] [address]  Culture, blood (routine x 2)     Status: None (Preliminary result)   Collection Time: 01/19/21  8:56 AM   Specimen: BLOOD LEFT HAND  Result Value Ref Range Status   Specimen Description BLOOD LEFT HAND  Final   Special Requests   Final    BOTTLES DRAWN AEROBIC ONLY Blood Culture results may not be optimal due to an inadequate volume of blood received in culture bottles   Culture   Final    NO GROWTH 3 DAYS Performed at [facility], [place] [address], [place], [place] [address]    Report Status PENDING  Incomplete  Culture, blood (routine x 2)     Status: None (Preliminary result)   Collection Time: 01/19/21  8:58 AM   Specimen: BLOOD LEFT HAND  Result Value Ref Range Status   Specimen Description BLOOD LEFT HAND  Final   Special Requests   Final    BOTTLES DRAWN AEROBIC ONLY Blood Culture results may not be optimal due to an inadequate volume of blood received in culture bottles   Culture   Final    NO GROWTH 3 DAYS Performed at [facility], [place] [address] [place], [place] [address]    Report Status PENDING  Incomplete  MRSA Next Gen by PCR, Nasal     Status: None   Collection Time: 01/20/21  5:31 AM   Specimen: Nasal Mucosa; Nasal Swab  Result Value Ref Range Status   MRSA by PCR Next Gen NOT DETECTED NOT DETECTED Final    Comment: (NOTE) The GeneXpert MRSA Assay (FDA approved for NASAL specimens  only), is one component of a comprehensive MRSA colonization surveillance program. It is not intended to diagnose MRSA infection nor to guide or monitor treatment for MRSA infections. Test performance is not FDA approved in patients less than 36 years old. Performed at [facility], [place] [address], [place], [place] [address]          Radiology Studies: ECHO TEE  Result Date: 01/22/2021    TRANSESOPHOGEAL ECHO REPORT   Patient Name:   Belinda [name] Date of Exam: 01/22/2021 Medical Rec #:  [id number]        Height:       60.0 in Accession #:    [id number]       Weight:       154.1 lb Date of Birth:  07/24/1950       BSA:          1.671 m Patient Age:  69 years         BP:           156/75 mmHg Patient Gender: F                HR:           96 bpm. Exam Location:  Inpatient Procedure: Transesophageal Echo and Cardiac Doppler Indications:     Bacteremia  History:         Patient has prior history of Echocardiogram examinations, most                  recent 01/19/2021. CHF, Pulmonary HTN; Risk                  Factors:Hypertension, Diabetes, Dyslipidemia and Non-Smoker.                  CKD.  Sonographer:     Vickie Epley RDCS Referring Phys:  [id number] Abigail Butts Diagnosing Phys: Skeet Latch MD PROCEDURE: The transesophogeal probe was passed without difficulty through the esophogus of the patient. Sedation performed by different physician. The patient was monitored while under deep sedation. Anesthestetic sedation was provided intravenously by Anesthesiology: 201.12mg  of Propofol. The patient's vital signs; including heart rate, blood pressure, and oxygen saturation; remained stable throughout the procedure. The patient developed no complications during the procedure. IMPRESSIONS  1. Left ventricular ejection fraction, by estimation, is 60 to 65%. The left ventricle has normal function. The left ventricle has no regional wall motion abnormalities.  2. Right ventricular systolic  function is normal. The right ventricular size is normal.  3. No left atrial/left atrial appendage thrombus was detected.  4. The mitral valve is normal in structure. Trivial mitral valve regurgitation. No evidence of mitral stenosis.  5. The aortic valve is tricuspid. Aortic valve regurgitation is trivial. No aortic stenosis is present.  6. The inferior vena cava is normal in size with greater than 50% respiratory variability, suggesting right atrial pressure of 3 mmHg. Conclusion(s)/Recommendation(s): Normal biventricular function without evidence of hemodynamically significant valvular heart disease. No evidence of vegetation/infective endocarditis on this transesophageal echocardiogram. FINDINGS  Left Ventricle: Left ventricular ejection fraction, by estimation, is 60 to 65%. The left ventricle has normal function. The left ventricle has no regional wall motion abnormalities. The left ventricular internal cavity size was normal in size. There is  no left ventricular hypertrophy. Right Ventricle: The right ventricular size is normal. No increase in right ventricular wall thickness. Right ventricular systolic function is normal. Left Atrium: Left atrial size was normal in size. No left atrial/left atrial appendage thrombus was detected. Right Atrium: Right atrial size was normal in size. Pericardium: Trivial pericardial effusion is present. Mitral Valve: The mitral valve is normal in structure. Trivial mitral valve regurgitation. No evidence of mitral valve stenosis. Tricuspid Valve: The tricuspid valve is normal in structure. Tricuspid valve regurgitation is not demonstrated. No evidence of tricuspid stenosis. Aortic Valve: The aortic valve is tricuspid. Aortic valve regurgitation is trivial. No aortic stenosis is present. Pulmonic Valve: The pulmonic valve was normal in structure. Pulmonic valve regurgitation is not visualized. No evidence of pulmonic stenosis. Aorta: The aortic root is normal in size and  structure. Venous: The inferior vena cava is normal in size with greater than 50% respiratory variability, suggesting right atrial pressure of 3 mmHg. IAS/Shunts: No atrial level shunt detected by color flow Doppler. Skeet Latch MD Electronically signed by Skeet Latch MD Signature Date/Time: 01/22/2021/4:08:17 PM  Final         Scheduled Meds:  aspirin EC  81 mg Oral Once per day on Mon Wed Fri   Chlorhexidine Gluconate Cloth  6 each Topical Daily   darbepoetin (ARANESP) injection - DIALYSIS  60 mcg Intravenous Q Thu-HD   simvastatin  40 mg Oral QHS   And   ezetimibe  10 mg Oral QHS   heparin injection (subcutaneous)  5,000 Units Subcutaneous Q8H   insulin aspart  0-9 Units Subcutaneous TID WC   insulin glargine  14 Units Subcutaneous Daily   lidocaine  1 patch Transdermal Q24H   losartan  12.5 mg Oral Daily   midodrine  10 mg Oral Once per day on Tue Thu Sat   mometasone-formoterol  2 puff Inhalation BID   Continuous Infusions:  vancomycin 750 mg (01/22/21 1048)     LOS: 3 days    Time spent: 25 minutes    Geradine Girt, DO [facility]  If 7PM-7AM, please contact night-coverage [email or web address] 01/23/2021, 11:31 AM

## 2021-01-23 NOTE — Progress Notes (Signed)
[facility] Progress Note   Subjective:  Seen in room - back pain still present but improved with pain patch. No CP or dyspnea. Dialysis went fine yesterday except felt like her BP got low at one point. TEE without valve vegetation, thoracic MRI with multi-level DDD, no clear osteo or abscess. Per ID, looks like plan is for Vanc thru 01/29/21.  Objective Vitals:   01/22/21 1958 01/22/21 2343 01/23/21 0339 01/23/21 0742  BP:  (!) 178/73 (!) 176/73 (!) 175/74  Pulse: 97 99 (!) 103 100  Resp: 18 18 19 20   Temp:  98.4 F (36.9 C) 98.7 F (37.1 C) 98.5 F (36.9 C)  TempSrc:  Oral Oral Oral  SpO2: 99% 99% 100% 99%  Weight:      Height:       Physical Exam General: Well appearing, NAD. Room air Heart: RRR; 2/6 murmur Lungs: CTAB  Abdomen: soft Extremities: No LE edema Dialysis Access: R thigh AVG + bruit  Additional Objective Labs: Basic Metabolic Panel: Recent Labs  Lab 01/19/21 0046 01/20/21 0214 01/22/21 0020 01/22/21 0747  NA 129* 129* 128*  --   K 3.8 4.0 4.2  --   CL 88* 87* 88*  --   CO2 26 25 25   --   GLUCOSE 346* 310* 291*  --   BUN 53* 64* 43*  --   CREATININE 8.83* 10.62* 8.36*  --   CALCIUM 8.5* 8.4* 8.6*  --   PHOS  --   --   --  4.4   Liver Function Tests: Recent Labs  Lab 01/17/21 1805 01/18/21 0545 01/20/21 0214  AST 41 31 20  ALT 67* 46* 30  ALKPHOS 242* 196* 203*  BILITOT 2.1* 1.8* 1.1  PROT 7.1 5.6* 5.6*  ALBUMIN 3.3* 2.6* 2.2*   CBC: Recent Labs  Lab 01/17/21 1805 01/18/21 0545 01/19/21 0046 01/20/21 0214 01/22/21 0020  WBC 9.4 8.0 8.7 14.0* 13.5*  NEUTROABS 7.1 5.2  --   --   --   HGB 11.0* 10.0* 8.9* 9.5* 9.0*  HCT 31.1* 29.2* 25.0* 26.5* 25.3*  MCV 82.7 84.6 81.4 80.1 80.3  PLT PLATELET CLUMPS NOTED ON SMEAR, UNABLE TO ESTIMATE 79* 88* 141* 234   Blood Culture    Component Value Date/Time   SDES BLOOD LEFT HAND 01/19/2021 0858   SPECREQUEST  01/19/2021 0858    BOTTLES DRAWN AEROBIC ONLY Blood Culture  results may not be optimal due to an inadequate volume of blood received in culture bottles   CULT  01/19/2021 0858    NO GROWTH 3 DAYS Performed at [facility], [place] [address], [place], [place] [address]    REPTSTATUS PENDING 01/19/2021 5701   Studies/Results: ECHO TEE  Result Date: 01/22/2021    TRANSESOPHOGEAL ECHO REPORT   Patient Name:   Belinda [name] Date of Exam: 01/22/2021 Medical Rec #:  [id number]        Height:       60.0 in Accession #:    [id number]       Weight:       154.1 lb Date of Birth:  [birth year]       BSA:          1.671 m Patient Age:    70 years         BP:           156/75 mmHg Patient Gender: F  HR:           96 bpm. Exam Location:  Inpatient Procedure: Transesophageal Echo and Cardiac Doppler Indications:     Bacteremia  History:         Patient has prior history of Echocardiogram examinations, most                  recent 01/19/2021. CHF, Pulmonary HTN; Risk                  Factors:Hypertension, Diabetes, Dyslipidemia and Non-Smoker.                  CKD.  Sonographer:     Vickie Epley RDCS Referring Phys:  [id number] Abigail Butts Diagnosing Phys: Skeet Latch MD PROCEDURE: The transesophogeal probe was passed without difficulty through the esophogus of the patient. Sedation performed by different physician. The patient was monitored while under deep sedation. Anesthestetic sedation was provided intravenously by Anesthesiology: 201.12mg  of Propofol. The patient's vital signs; including heart rate, blood pressure, and oxygen saturation; remained stable throughout the procedure. The patient developed no complications during the procedure. IMPRESSIONS  1. Left ventricular ejection fraction, by estimation, is 60 to 65%. The left ventricle has normal function. The left ventricle has no regional wall motion abnormalities.  2. Right ventricular systolic function is normal. The right ventricular size is normal.  3. No left atrial/left atrial appendage  thrombus was detected.  4. The mitral valve is normal in structure. Trivial mitral valve regurgitation. No evidence of mitral stenosis.  5. The aortic valve is tricuspid. Aortic valve regurgitation is trivial. No aortic stenosis is present.  6. The inferior vena cava is normal in size with greater than 50% respiratory variability, suggesting right atrial pressure of 3 mmHg. Conclusion(s)/Recommendation(s): Normal biventricular function without evidence of hemodynamically significant valvular heart disease. No evidence of vegetation/infective endocarditis on this transesophageal echocardiogram. FINDINGS  Left Ventricle: Left ventricular ejection fraction, by estimation, is 60 to 65%. The left ventricle has normal function. The left ventricle has no regional wall motion abnormalities. The left ventricular internal cavity size was normal in size. There is  no left ventricular hypertrophy. Right Ventricle: The right ventricular size is normal. No increase in right ventricular wall thickness. Right ventricular systolic function is normal. Left Atrium: Left atrial size was normal in size. No left atrial/left atrial appendage thrombus was detected. Right Atrium: Right atrial size was normal in size. Pericardium: Trivial pericardial effusion is present. Mitral Valve: The mitral valve is normal in structure. Trivial mitral valve regurgitation. No evidence of mitral valve stenosis. Tricuspid Valve: The tricuspid valve is normal in structure. Tricuspid valve regurgitation is not demonstrated. No evidence of tricuspid stenosis. Aortic Valve: The aortic valve is tricuspid. Aortic valve regurgitation is trivial. No aortic stenosis is present. Pulmonic Valve: The pulmonic valve was normal in structure. Pulmonic valve regurgitation is not visualized. No evidence of pulmonic stenosis. Aorta: The aortic root is normal in size and structure. Venous: The inferior vena cava is normal in size with greater than 50% respiratory  variability, suggesting right atrial pressure of 3 mmHg. IAS/Shunts: No atrial level shunt detected by color flow Doppler. Skeet Latch MD Electronically signed by Skeet Latch MD Signature Date/Time: 01/22/2021/4:08:17 PM    Final     Medications:  vancomycin 750 mg (01/22/21 1048)    aspirin EC  81 mg Oral Once per day on Mon Wed Fri   Chlorhexidine Gluconate Cloth  6 each Topical Daily   darbepoetin (  ARANESP) injection - DIALYSIS  60 mcg Intravenous Q Thu-HD   simvastatin  40 mg Oral QHS   And   ezetimibe  10 mg Oral QHS   heparin injection (subcutaneous)  5,000 Units Subcutaneous Q8H   insulin aspart  0-9 Units Subcutaneous TID WC   insulin glargine  14 Units Subcutaneous Daily   lidocaine  1 patch Transdermal Q24H   losartan  12.5 mg Oral Daily   midodrine  10 mg Oral Once per day on Tue Thu Sat   mometasone-formoterol  2 puff Inhalation BID    Dialysis Orders: [facility] TTS 4h 400/1.5A EDW 67 kg 2K/2Ca UFP 4 AVG Heparin 4000 Mircera 60 q 2wks Calcitriol 1.5 TIW    Assessment/Plan: 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 Per ID, plan for Vanc thru 8/4. Hypoxia - on 2L O2.  CXR with hypoinflation of the lungs and atelectasis, CT chest with 8 mm ground-glass opacity seen in the left lung apex, infection vs inflammation vs neoplasm. Improved today ESRD: Continue HD per usual TTS schedule - next 7/30. Hypertension/volume: BP remains high, B effusions on chest imaging. Continue to challenge dry weight, get standing weights if able, lower on d/c.  Anemia  - Hgb 9, continue Aranesp q Thurs. Metabolic bone disease -  Ca/Phos ok. Continue VDRA and binders. DMT2 Chronic systolic HF  Veneta Penton, PA-C 01/23/2021, 11:26 AM  [facility]

## 2021-01-23 NOTE — Progress Notes (Addendum)
[facility]  Date of Admission:  8/33/3832     Cc: granulicatella bsi                                     Referring Provider: Eliseo Squires     Lines:  Peripheral iv's Right LE avgraft   Abx: 7/24-c vanc   7/24-25 cefepime/flagyl                                                               Assessment: Granulicatella adiacens bsi Esrd Chf   70 yo female dm2, left TKA h x, esrd on iHD via RLE AV graft, chronic cervical/lumbar back pain admitted from dialysis center for sepsis and hypoxemic respiratory distress found to have granulicatella bsi in setting of 1 week malaise 2 weeks after a dental procedure     Clinicaly presentation concerning for IE. Granulicatella is a nutrition-variant strep that often requires treatment like enterococcus endocarditis in terms of beta-lactam aminoglycoside. It is often susceptible to vanc and less so with ceftriaxone. Sensitivity testing is difficult   Has n/v/diarrhea ?related to sepsis/missing dialysis. Doubt other viral process/tick process. Thrombocytopenia and mild lft probably due to sepsis    She dialyzed via chronic right LE avg. The graft doesn't appear infected at this time She complained of the last 3 weeks very severe thoracic pain as well, will get mri imaging to r/o infection involvement Her left tka is without acute changes concerning for infection   --------- 7/29 assessment Repeat bcx 7/25 negative 7/25 tte poor quality.  7/28 TEE without evidence endocarditis 7/26 Mri thoracic spine no abscess; t6-9 ?early OM changes   Fortunately at this time doesn't appear she has endocarditis or might be too early to see on TEE. Would presume without endocarditis mri finding doesn't suggest osteomyelitis   Would treat her for total 10 days vancomycin from 7/25, and follow up 2-3 weeks off antibiotics  Discussed with Primary Team Dr Eliseo Squires        Plan: Elizebeth Koller vancomycin (tiw with dialysis) on  8/04 Ok to discharge from id standpoint  Id follow up as below Will sign off   OPAT Orders   Labs weekly while on IV antibiotics: _x_ CBC with differential __ BMP _x_ CMP _x_ CRP __ ESR __ Vancomycin trough __ CK  __ Please pull PIC at completion of IV antibiotics __ Please leave PIC in place until doctor has seen patient or been notified  Fax weekly labs to [phone]  Clinic Follow Up Appt: 8/19 @ 1130 with dr Gale Journey  @  [facility] [place], [place], [place] [address] Phone: [phone]   I spent more than 35 minute reviewing data/chart, and coordinating care and >50% direct face to face time providing counseling/discussing diagnostics/treatment plan with patient    Principal Problem:   Sepsis due to undetermined organism [facility]) Active Problems:   Essential hypertension   Chronic systolic (congestive) heart failure (HCC)   DM (diabetes mellitus), type 2 with renal complications ([place])   ESRD on hemodialysis ([place])   Anemia due to end stage renal disease ([place])   Abnormal liver function tests  Hypoxia   Bacteremia   Allergies  Allergen Reactions   Doxycycline Other (See Comments)   Entresto [Sacubitril-Valsartan] Itching   Metformin Other (See Comments)    weakness   Omnicef [Cefdinir] Other (See Comments)    GI Upset   Aspirin Other (See Comments)    GI issues, Can tolerate low aspirin   Augmentin [Amoxicillin-Pot Clavulanate] Nausea And Vomiting and Other (See Comments)    GI issues DID THE REACTION INVOLVE: Swelling of the face/tongue/throat, SOB, or low BP? No Sudden or severe rash/hives, skin peeling, or the inside of the mouth or nose? No Did it require medical treatment? No When did it last happen?      long time  If all above answers are "NO", may proceed with cephalosporin use.    Erythromycin Nausea And Vomiting and Other (See Comments)    Gi issues   Gabapentin Swelling   Nickel Itching and Rash    Breakouts      Scheduled Meds:  aspirin EC  81 mg Oral Once per day on Mon Wed Fri   Chlorhexidine Gluconate Cloth  6 each Topical Daily   darbepoetin (ARANESP) injection - DIALYSIS  60 mcg Intravenous Q Thu-HD   simvastatin  40 mg Oral QHS   And   ezetimibe  10 mg Oral QHS   heparin injection (subcutaneous)  5,000 Units Subcutaneous Q8H   insulin aspart  0-9 Units Subcutaneous TID WC   insulin glargine  14 Units Subcutaneous Daily   lidocaine  1 patch Transdermal Q24H   losartan  12.5 mg Oral Daily   midodrine  10 mg Oral Once per day on Tue Thu Sat   mometasone-formoterol  2 puff Inhalation BID   Continuous Infusions:  vancomycin 750 mg (01/22/21 1048)   PRN Meds:.acetaminophen **OR** acetaminophen, ALPRAZolam, diphenhydrAMINE, guaiFENesin, ondansetron **OR** ondansetron (ZOFRAN) IV, oxyCODONE, sorbitol   SUBJECTIVE: Doing well Back pain controlled No n/v/diarrhea No f/c No rash Tee from 7/28 no endocarditis -- reviewed with patient   Review of Systems: ROS All other ROS was negative, except mentioned above     OBJECTIVE: Vitals:   01/22/21 1958 01/22/21 2343 01/23/21 0339 01/23/21 0742  BP:  (!) 178/73 (!) 176/73 (!) 175/74  Pulse: 97 99 (!) 103 100  Resp: $Remo'18 18 19 20  'xUvEZ$ Temp:  98.4 F (36.9 C) 98.7 F (37.1 C) 98.5 F (36.9 C)  TempSrc:  Oral Oral Oral  SpO2: 99% 99% 100% 99%  Weight:      Height:       Body mass index is 30.1 kg/m.  Physical Exam General/constitutional: no distress, pleasant HEENT: Normocephalic, PER, Conj Clear, EOMI, Oropharynx clear Neck supple CV: rrr no mrg Lungs: clear to auscultation, normal respiratory effort Abd: Soft, Nontender Ext: no edema Skin: No Rash Neuro: nonfocal MSK: no peripheral joint swelling/tenderness/warmth   Central line presence: no    Lab Results Lab Results  Component Value Date   WBC 13.5 (H) 01/22/2021   HGB 9.0 (L) 01/22/2021   HCT 25.3 (L) 01/22/2021   MCV 80.3 01/22/2021   PLT 234 01/22/2021     Lab Results  Component Value Date   CREATININE 8.36 (H) 01/22/2021   BUN 43 (H) 01/22/2021   NA 128 (L) 01/22/2021   K 4.2 01/22/2021   CL 88 (L) 01/22/2021   CO2 25 01/22/2021    Lab Results  Component Value Date   ALT 30 01/20/2021   AST 20 01/20/2021   ALKPHOS 203 (H) 01/20/2021  BILITOT 1.1 01/20/2021      Microbiology: Recent Results (from the past 240 hour(s))  Resp Panel by RT-PCR (Flu A&B, Covid) Nasopharyngeal Swab     Status: None   Collection Time: 01/17/21  5:59 PM   Specimen: Nasopharyngeal Swab; Nasopharyngeal(NP) swabs in vial transport medium  Result Value Ref Range Status   SARS Coronavirus 2 by RT PCR NEGATIVE NEGATIVE Final    Comment: (NOTE) SARS-CoV-2 target nucleic acids are NOT DETECTED.  The SARS-CoV-2 RNA is generally detectable in upper respiratory specimens during the acute phase of infection. The lowest concentration of SARS-CoV-2 viral copies this assay can detect is 138 copies/mL. A negative result does not preclude SARS-Cov-2 infection and should not be used as the sole basis for treatment or other patient management decisions. A negative result may occur with  improper specimen collection/handling, submission of specimen other than nasopharyngeal swab, presence of viral mutation(s) within the areas targeted by this assay, and inadequate number of viral copies(<138 copies/mL). A negative result must be combined with clinical observations, patient history, and epidemiological information. The expected result is Negative.  Fact Sheet for Patients:  EntrepreneurPulse.com.au  Fact Sheet for Healthcare Providers:  IncredibleEmployment.be  This test is no t yet approved or cleared by the [place] FDA and  has been authorized for detection and/or diagnosis of SARS-CoV-2 by FDA under an Emergency Use Authorization (EUA). This EUA will remain  in effect (meaning this test can be used) for the duration  of the COVID-19 declaration under Section 564(b)(1) of the Act, 21 U.S.C.section 360bbb-3(b)(1), unless the authorization is terminated  or revoked sooner.       Influenza A by PCR NEGATIVE NEGATIVE Final   Influenza B by PCR NEGATIVE NEGATIVE Final    Comment: (NOTE) The Xpert Xpress SARS-CoV-2/FLU/RSV plus assay is intended as an aid in the diagnosis of influenza from Nasopharyngeal swab specimens and should not be used as a sole basis for treatment. Nasal washings and aspirates are unacceptable for Xpert Xpress SARS-CoV-2/FLU/RSV testing.  Fact Sheet for Patients: EntrepreneurPulse.com.au  Fact Sheet for Healthcare Providers: IncredibleEmployment.be  This test is not yet approved or cleared by the [place] FDA and has been authorized for detection and/or diagnosis of SARS-CoV-2 by FDA under an Emergency Use Authorization (EUA). This EUA will remain in effect (meaning this test can be used) for the duration of the COVID-19 declaration under Section 564(b)(1) of the Act, 21 U.S.C. section 360bbb-3(b)(1), unless the authorization is terminated or revoked.  Performed at [facility], [place] [address], [place], [place] [address]   Blood culture (routine x 2)     Status: Abnormal (Preliminary result)   Collection Time: 01/17/21  8:46 PM   Specimen: BLOOD LEFT HAND  Result Value Ref Range Status   Specimen Description BLOOD LEFT HAND  Final   Special Requests   Final    BOTTLES DRAWN AEROBIC AND ANAEROBIC Blood Culture results may not be optimal due to an inadequate volume of blood received in culture bottles   Culture  Setup Time   Final    GRAM POSITIVE COCCI IN PAIRS AND CHAINS IN BOTH AEROBIC AND ANAEROBIC BOTTLES CRITICAL RESULT CALLED TO, READ BACK BY AND VERIFIED WITH: PHARM D J.ORIET ON [id number] AT 8325 BY E.PARRISH    Culture (A)  Final    GRANULICATELLA ADIACENS ORG POOR GROWTH CULTURE REINCUBATED FOR BETTER  GROWTH Performed at [facility], [place] [address] [place], [place] [address]    Report  Status PENDING  Incomplete  Blood culture (routine x 2)     Status: Abnormal (Preliminary result)   Collection Time: 01/17/21  8:46 PM   Specimen: BLOOD  Result Value Ref Range Status   Specimen Description BLOOD LEFT ANTECUBITAL  Final   Special Requests   Final    BOTTLES DRAWN AEROBIC AND ANAEROBIC Blood Culture results may not be optimal due to an inadequate volume of blood received in culture bottles   Culture  Setup Time   Final    GRAM POSITIVE COCCI IN PAIRS AND CHAINS IN BOTH AEROBIC AND ANAEROBIC BOTTLES CRITICAL VALUE NOTED.  VALUE IS CONSISTENT WITH PREVIOUSLY REPORTED AND CALLED VALUE. Performed at [facility], [place] [address] [place], [place] [address]    Culture GRANULICATELLA ADIACENS (A)  Final   Report Status PENDING  Incomplete  Blood Culture ID Panel (Reflexed)     Status: None   Collection Time: 01/17/21  8:46 PM  Result Value Ref Range Status   Enterococcus faecalis NOT DETECTED NOT DETECTED Final   Enterococcus Faecium NOT DETECTED NOT DETECTED Final   Listeria monocytogenes NOT DETECTED NOT DETECTED Final   Staphylococcus species NOT DETECTED NOT DETECTED Final   Staphylococcus aureus (BCID) NOT DETECTED NOT DETECTED Final   Staphylococcus epidermidis NOT DETECTED NOT DETECTED Final   Staphylococcus lugdunensis NOT DETECTED NOT DETECTED Final   Streptococcus species NOT DETECTED NOT DETECTED Final   Streptococcus agalactiae NOT DETECTED NOT DETECTED Final   Streptococcus pneumoniae NOT DETECTED NOT DETECTED Final   Streptococcus pyogenes NOT DETECTED NOT DETECTED Final   A.calcoaceticus-baumannii NOT DETECTED NOT DETECTED Final   Bacteroides fragilis NOT DETECTED NOT DETECTED Final   Enterobacterales NOT DETECTED NOT DETECTED Final   Enterobacter cloacae complex NOT DETECTED NOT DETECTED Final   Escherichia coli NOT DETECTED NOT DETECTED Final   Klebsiella  aerogenes NOT DETECTED NOT DETECTED Final   Klebsiella oxytoca NOT DETECTED NOT DETECTED Final   Klebsiella pneumoniae NOT DETECTED NOT DETECTED Final   Proteus species NOT DETECTED NOT DETECTED Final   Salmonella species NOT DETECTED NOT DETECTED Final   Serratia marcescens NOT DETECTED NOT DETECTED Final   Haemophilus influenzae NOT DETECTED NOT DETECTED Final   Neisseria meningitidis NOT DETECTED NOT DETECTED Final   Pseudomonas aeruginosa NOT DETECTED NOT DETECTED Final   Stenotrophomonas maltophilia NOT DETECTED NOT DETECTED Final   Candida albicans NOT DETECTED NOT DETECTED Final   Candida auris NOT DETECTED NOT DETECTED Final   Candida glabrata NOT DETECTED NOT DETECTED Final   Candida krusei NOT DETECTED NOT DETECTED Final   Candida parapsilosis NOT DETECTED NOT DETECTED Final   Candida tropicalis NOT DETECTED NOT DETECTED Final   Cryptococcus neoformans/gattii NOT DETECTED NOT DETECTED Final    Comment: Performed at [facility], [address], [place], [place] [address]  Culture, blood (routine x 2)     Status: None (Preliminary result)   Collection Time: 01/19/21  8:56 AM   Specimen: BLOOD LEFT HAND  Result Value Ref Range Status   Specimen Description BLOOD LEFT HAND  Final   Special Requests   Final    BOTTLES DRAWN AEROBIC ONLY Blood Culture results may not be optimal due to an inadequate volume of blood received in culture bottles   Culture   Final    NO GROWTH 3 DAYS Performed at [facility], [place] [address], [place], [place] [address]    Report Status PENDING  Incomplete  Culture, blood (routine x  2)     Status: None (Preliminary result)   Collection Time: 01/19/21  8:58 AM   Specimen: BLOOD LEFT HAND  Result Value Ref Range Status   Specimen Description BLOOD LEFT HAND  Final   Special Requests   Final    BOTTLES DRAWN AEROBIC ONLY Blood Culture results may not be optimal due to an inadequate volume of blood received in culture bottles   Culture    Final    NO GROWTH 3 DAYS Performed at [facility], [place] [address] [place], [place] [address]    Report Status PENDING  Incomplete  MRSA Next Gen by PCR, Nasal     Status: None   Collection Time: 01/20/21  5:31 AM   Specimen: Nasal Mucosa; Nasal Swab  Result Value Ref Range Status   MRSA by PCR Next Gen NOT DETECTED NOT DETECTED Final    Comment: (NOTE) The GeneXpert MRSA Assay (FDA approved for NASAL specimens only), is one component of a comprehensive MRSA colonization surveillance program. It is not intended to diagnose MRSA infection nor to guide or monitor treatment for MRSA infections. Test performance is not FDA approved in patients less than 29 years old. Performed at [facility], [place] [address], [place], [place] [address]      Serology:   Imaging: If present, new imagings (plain films, ct scans, and mri) have been personally visualized and interpreted; radiology reports have been reviewed. Decision making incorporated into the Impression / Recommendations.  7/28 tee No endocartditis  7/26 mri thoracic spine no contrast 1. 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 Resultant mild diffuse spinal stenosis at T6-7 through T10-11, with associated mild to moderate multilevel foraminal narrowing as detailed above. 2. Mild marrow edema involving the endplates at B7-6 through T8-9, favored to be degenerative in nature. Possible changes of early osteomyelitis discitis are difficult to exclude, although are felt to be unlikely. If patient's symptoms should persist and/or worsen, a short interval follow-up MRI to evaluate for interval change may be helpful for further evaluation as warranted. No other evidence for acute infection within the thoracic spine. 3. Patchy signal abnormality involving the cervical spinal cord at the level of C3-4, most consistent with  chronic myelomalacia. 4. Small layering bilateral pleural effusions, left greater than right.  7/25 tte  1. Left ventricular ejection fraction, by estimation, is 55%. The left  ventricle has normal function. The left ventricle has no regional wall  motion abnormalities. There is mild left ventricular hypertrophy. Left  ventricular diastolic parameters are  consistent with Grade I diastolic dysfunction (impaired relaxation).   2. Peak RV-RA gradient 22 mmHg. Right ventricular systolic function is  mildly reduced. The right ventricular size is normal.   3. The mitral valve is normal in structure. Trivial mitral valve  regurgitation. No evidence of mitral stenosis.   4. The aortic valve is tricuspid. Aortic valve regurgitation is not  visualized. No aortic stenosis is present.   5. The IVC was not visualized.   6. Technically difficult study with poor acoustic windows.    7/24 ct chest abd pelv with angio 1. No evidence of pulmonary artery embolism. 2. Cardiomegaly and small pericardial effusion. 3. Diffuse body wall edema could reflect early volume overload/anasarca. 4. Distended gallbladder with layering hyperdensity, could reflect tiny biliary stones/sand or vicarious extravasation of contrast if patient has had a recent contrasted exam. While there is no pericholecystic fluid or  inflammation, could correlate for right upper quadrant symptoms as isolated distension can be an early sign of developing cholecystitis. No biliary ductal dilatation or intraductal gallstones. 5. Small amount of fluid surrounding the anastomosis of a right femoral AV fistula in the right inguinal chain. Could reflect small chronic postoperative seroma though should correlate fistula function and local assessment of the inguinal region. 6. Small broad-based umbilical hernia into which protrudes portion of the anti mesenteric transverse colon without evidence of vascular compromise or obstruction. 7.  Diffuse osseous manifestations of renal osteodystrophy.   Jabier Mutton, [facility] [place] [phone] pager    01/23/2021, 10:37 AM

## 2021-01-24 DIAGNOSIS — D631 Anemia in chronic kidney disease: Secondary | ICD-10-CM

## 2021-01-24 LAB — GLUCOSE, CAPILLARY
Glucose-Capillary: 284 mg/dL — ABNORMAL HIGH (ref 70–99)
Glucose-Capillary: 259 mg/dL — ABNORMAL HIGH (ref 70–99)
Glucose-Capillary: 174 mg/dL — ABNORMAL HIGH (ref 70–99)
Glucose-Capillary: 204 mg/dL — ABNORMAL HIGH (ref 70–99)

## 2021-01-24 LAB — CULTURE, BLOOD (ROUTINE X 2)
Culture: NO GROWTH
Culture: NO GROWTH

## 2021-01-24 MED ORDER — CARVEDILOL 3.125 MG PO TABS
3.1250 mg | ORAL_TABLET | Freq: Two times a day (BID) | ORAL | Status: DC
  Administered 2021-01-24 – 2021-01-25 (×2): 3.125 mg via ORAL
  Filled 2021-01-24 (×2): qty 1

## 2021-01-24 MED ORDER — INSULIN GLARGINE 100 UNIT/ML ~~LOC~~ SOLN
25.0000 [IU] | Freq: Every day | SUBCUTANEOUS | Status: DC
  Administered 2021-01-25: 25 [IU] via SUBCUTANEOUS
  Filled 2021-01-24: qty 0.25

## 2021-01-24 MED ORDER — LORATADINE 10 MG PO TABS
10.0000 mg | ORAL_TABLET | Freq: Every day | ORAL | Status: DC
  Administered 2021-01-24 – 2021-01-25 (×2): 10 mg via ORAL
  Filled 2021-01-24 (×2): qty 1

## 2021-01-24 MED ORDER — VANCOMYCIN HCL 750 MG/150ML IV SOLN
INTRAVENOUS | Status: AC
  Administered 2021-01-24: 750 mg via INTRAVENOUS
  Filled 2021-01-24: qty 150

## 2021-01-24 NOTE — Progress Notes (Signed)
[facility] Progress Note   Subjective:  Seen in room - back pain slowly improving. No CP or dyspnea.  Objective Vitals:   01/23/21 2355 01/24/21 0445 01/24/21 0500 01/24/21 0730  BP: (!) 165/68 (!) 169/67  (!) 179/84  Pulse: (!) 110 (!) 107  (!) 108  Resp: 17 15  18   Temp: 98.7 F (37.1 C) 98.5 F (36.9 C)  98.4 F (36.9 C)  TempSrc: Oral Oral  Oral  SpO2: 98% 98%  100%  Weight:   68.1 kg   Height:       Physical Exam General: Well appearing, NAD. Room air Heart: RRR; 2/6 murmur Lungs: CTAB  Abdomen: soft Extremities: No LE edema Dialysis Access: R thigh AVG + bruit  Additional Objective Labs: Basic Metabolic Panel: Recent Labs  Lab 01/19/21 0046 01/20/21 0214 01/22/21 0020 01/22/21 0747  NA 129* 129* 128*  --   K 3.8 4.0 4.2  --   CL 88* 87* 88*  --   CO2 26 25 25   --   GLUCOSE 346* 310* 291*  --   BUN 53* 64* 43*  --   CREATININE 8.83* 10.62* 8.36*  --   CALCIUM 8.5* 8.4* 8.6*  --   PHOS  --   --   --  4.4   Liver Function Tests: Recent Labs  Lab 01/17/21 1805 01/18/21 0545 01/20/21 0214  AST 41 31 20  ALT 67* 46* 30  ALKPHOS 242* 196* 203*  BILITOT 2.1* 1.8* 1.1  PROT 7.1 5.6* 5.6*  ALBUMIN 3.3* 2.6* 2.2*   CBC: Recent Labs  Lab 01/17/21 1805 01/18/21 0545 01/19/21 0046 01/20/21 0214 01/22/21 0020  WBC 9.4 8.0 8.7 14.0* 13.5*  NEUTROABS 7.1 5.2  --   --   --   HGB 11.0* 10.0* 8.9* 9.5* 9.0*  HCT 31.1* 29.2* 25.0* 26.5* 25.3*  MCV 82.7 84.6 81.4 80.1 80.3  PLT PLATELET CLUMPS NOTED ON SMEAR, UNABLE TO ESTIMATE 79* 88* 141* 234   Studies/Results: DG CHEST PORT 1 VIEW  Result Date: 01/23/2021 CLINICAL DATA:  Cough. EXAM: PORTABLE CHEST 1 VIEW COMPARISON:  Chest radiograph and CTA 01/17/2021 FINDINGS: The cardiac silhouette remains mildly enlarged. The lungs are mildly hypoinflated with mild linear opacity in the left greater than right lung bases. No overt pulmonary edema, sizable pleural effusion, or pneumothorax is  identified. Prior cervical spine fusion is noted. IMPRESSION: Bibasilar atelectasis. Electronically Signed   By: Logan Bores M.D.   On: 01/23/2021 14:30   ECHO TEE  Result Date: 01/22/2021    TRANSESOPHOGEAL ECHO REPORT   Patient Name:   Belinda [name] Date of Exam: 01/22/2021 Medical Rec #:  [id number]        Height:       60.0 in Accession #:    [id number]       Weight:       154.1 lb Date of Birth:  09-24-1950       BSA:          1.671 m Patient Age:    70 years         BP:           156/75 mmHg Patient Gender: F                HR:           96 bpm. Exam Location:  Inpatient Procedure: Transesophageal Echo and Cardiac Doppler Indications:     Bacteremia  History:  Patient has prior history of Echocardiogram examinations, most                  recent 01/19/2021. CHF, Pulmonary HTN; Risk                  Factors:Hypertension, Diabetes, Dyslipidemia and Non-Smoker.                  CKD.  Sonographer:     Vickie Epley RDCS Referring Phys:  [id number] Abigail Butts Diagnosing Phys: Skeet Latch MD PROCEDURE: The transesophogeal probe was passed without difficulty through the esophogus of the patient. Sedation performed by different physician. The patient was monitored while under deep sedation. Anesthestetic sedation was provided intravenously by Anesthesiology: 201.12mg  of Propofol. The patient's vital signs; including heart rate, blood pressure, and oxygen saturation; remained stable throughout the procedure. The patient developed no complications during the procedure. IMPRESSIONS  1. Left ventricular ejection fraction, by estimation, is 60 to 65%. The left ventricle has normal function. The left ventricle has no regional wall motion abnormalities.  2. Right ventricular systolic function is normal. The right ventricular size is normal.  3. No left atrial/left atrial appendage thrombus was detected.  4. The mitral valve is normal in structure. Trivial mitral valve regurgitation. No evidence of mitral  stenosis.  5. The aortic valve is tricuspid. Aortic valve regurgitation is trivial. No aortic stenosis is present.  6. The inferior vena cava is normal in size with greater than 50% respiratory variability, suggesting right atrial pressure of 3 mmHg. Conclusion(s)/Recommendation(s): Normal biventricular function without evidence of hemodynamically significant valvular heart disease. No evidence of vegetation/infective endocarditis on this transesophageal echocardiogram. FINDINGS  Left Ventricle: Left ventricular ejection fraction, by estimation, is 60 to 65%. The left ventricle has normal function. The left ventricle has no regional wall motion abnormalities. The left ventricular internal cavity size was normal in size. There is  no left ventricular hypertrophy. Right Ventricle: The right ventricular size is normal. No increase in right ventricular wall thickness. Right ventricular systolic function is normal. Left Atrium: Left atrial size was normal in size. No left atrial/left atrial appendage thrombus was detected. Right Atrium: Right atrial size was normal in size. Pericardium: Trivial pericardial effusion is present. Mitral Valve: The mitral valve is normal in structure. Trivial mitral valve regurgitation. No evidence of mitral valve stenosis. Tricuspid Valve: The tricuspid valve is normal in structure. Tricuspid valve regurgitation is not demonstrated. No evidence of tricuspid stenosis. Aortic Valve: The aortic valve is tricuspid. Aortic valve regurgitation is trivial. No aortic stenosis is present. Pulmonic Valve: The pulmonic valve was normal in structure. Pulmonic valve regurgitation is not visualized. No evidence of pulmonic stenosis. Aorta: The aortic root is normal in size and structure. Venous: The inferior vena cava is normal in size with greater than 50% respiratory variability, suggesting right atrial pressure of 3 mmHg. IAS/Shunts: No atrial level shunt detected by color flow Doppler. Skeet Latch MD Electronically signed by Skeet Latch MD Signature Date/Time: 01/22/2021/4:08:17 PM    Final     Medications:  vancomycin 750 mg (01/22/21 1048)    aspirin EC  81 mg Oral Once per day on Mon Wed Fri   calcitRIOL  1.5 mcg Oral Once per day on Tue Thu Sat   Chlorhexidine Gluconate Cloth  6 each Topical Daily   darbepoetin (ARANESP) injection - DIALYSIS  60 mcg Intravenous Q Thu-HD   docusate sodium  100 mg Oral BID   simvastatin  40 mg  Oral QHS   And   ezetimibe  10 mg Oral QHS   ferric citrate  210 mg Oral TID WC   heparin injection (subcutaneous)  5,000 Units Subcutaneous Q8H   insulin aspart  0-9 Units Subcutaneous TID WC   insulin aspart  8 Units Subcutaneous TID WC   insulin glargine  18 Units Subcutaneous Daily   lidocaine  2 patch Transdermal Q24H   loratadine  10 mg Oral Daily   losartan  12.5 mg Oral Daily   midodrine  10 mg Oral Once per day on Tue Thu Sat   mometasone-formoterol  2 puff Inhalation BID    Dialysis Orders: [facility] TTS 4h 400/1.5A EDW 67 kg 2K/2Ca UFP 4 AVG Heparin 4000 Mircera 60 q 2wks Calcitriol 1.5 TIW    Assessment/Plan: 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 Per ID, plan for Vanc thru 8/4. Hypoxia - on 2L O2.  CXR with hypoinflation of the lungs and atelectasis, CT chest with 8 mm ground-glass opacity seen in the left lung apex, infection vs inflammation vs neoplasm. Improved today ESRD: Continue HD per usual TTS schedule - HD today. Hypertension/volume: BP remains high, B effusions on chest imaging. Continue to challenge dry weight, get standing weights if able, lower on d/c.  Anemia  - Hgb 9, continue Aranesp q Thurs. Metabolic bone disease -  Ca/Phos ok. Continue VDRA and binders. DMT2 Chronic systolic  HF  Veneta Penton, PA-C 01/24/2021, 10:33 AM  [employer]

## 2021-01-24 NOTE — Progress Notes (Signed)
Pharmacy Antibiotic Note  Belinda [name] is a 70 y.o. female on Vancomycin for Granulicatella adiacens bloodstream infection. ID recommends treating for a total of 10 days.   Plan: Continue vancomycin IV 750 mg qTThSa Will follow HD schedule for any need to adjust timing of doses.  Height: 5' (152.4 cm) Weight: 68.1 kg (150 lb 2.1 oz) IBW/kg (Calculated) : 45.5  Temp (24hrs), Avg:98.4 F (36.9 C), Min:97.8 F (36.6 C), Max:98.7 F (37.1 C)  Recent Labs  Lab 01/17/21 1805 01/17/21 2239 01/18/21 0545 01/19/21 0046 01/20/21 0214 01/22/21 0020  WBC 9.4  --  8.0 8.7 14.0* 13.5*  CREATININE 6.43*  --  7.41* 8.83* 10.62* 8.36*  LATICACIDVEN 2.3* 1.3  --   --   --   --       Allergies  Allergen Reactions   Doxycycline Other (See Comments)   Entresto [Sacubitril-Valsartan] Itching   Metformin Other (See Comments)    weakness   Omnicef [Cefdinir] Other (See Comments)    GI Upset   Aspirin Other (See Comments)    GI issues, Can tolerate low aspirin   Augmentin [Amoxicillin-Pot Clavulanate] Nausea And Vomiting and Other (See Comments)    GI issues DID THE REACTION INVOLVE: Swelling of the face/tongue/throat, SOB, or low BP? No Sudden or severe rash/hives, skin peeling, or the inside of the mouth or nose? No Did it require medical treatment? No When did it last happen?      long time  If all above answers are "NO", may proceed with cephalosporin use.    Erythromycin Nausea And Vomiting and Other (See Comments)    Gi issues   Gabapentin Swelling   Nickel Itching and Rash    Breakouts     Antimicrobials this admission: Vancomycin 7/23>> Cefepime 7/23>>7/25 Metronidazole 7/23>>7/25  Microbiology results: 7/23 Blood x 2: Granulicatella adiacens - reincubated 7/23 Urine: pending 7/25 blood x 2: no growth x 2 days to date 7/26 MRSA PCR: neg  Thank you for allowing pharmacy to be a part of this patient's care.  Pauletta Browns, Pharm.D. PGY-1 Ambulatory Care  Resident TKKOE:[phone] 01/24/2021 1:25 PM

## 2021-01-24 NOTE — Progress Notes (Signed)
PROGRESS NOTE    Belinda [name]  JME:[id number] DOB: 16-Nov-1950 DOA: 01/17/2021 PCP: Reynold Bowen, MD   Brief Narrative:   Belinda [name] is a 70 y.o. female with medical history significant of chronic systolic heart failure, ESRD anemia, ESRD on hemodialysis, anxiety/depression, chronic cervical and lumbar back pain, GERD, history of unspecified MRSA infection.   She states that she has been ill for about a week, had a sandwich from [place] [employer] on Sunday then subsequently started to have multiple episodes of vomiting.  Then on Wednesday, finished her leftover sandwich, then continued to have multiple episodes of diarrhea through the day.  Due to feeling ill, she missed her dialysis on Tuesday and Thursday.  She was able to complete her session on Saturday, however, she continued to have worsening generalized weakness, had pulse ox with oxygen saturation in the high 80s, requiring nasal cannula O2.  She appeared to have some symptoms suggesting gastroenteritis, but is now noted to have gram-positive bacteremia with associated sepsis and is improving.  S/p TEE on 7/28.  Plan for 10 days of IV vanc and outpatient ID Follow up.   Assessment & Plan:   Principal Problem:   Sepsis due to undetermined organism [facility]) Active Problems:   Essential hypertension   Chronic systolic (congestive) heart failure (HCC)   DM (diabetes mellitus), type 2 with renal complications (HCC)   ESRD on hemodialysis (HCC)   Anemia due to end stage renal disease (HCC)   Abnormal liver function tests   Hypoxia   Bacteremia   Sepsis present on admission secondary to granulicatella adiacens -Related to strep variant -2D echocardiogram: grade 1 diastolic -s/p TEE w/o endocarditis -ID consult appreciated: Would treat her for total 10 days vancomycin from 7/25, and follow up with ID in 2-3 weeks off antibiotics -worsening mid-back pain-- thoracic MRI: Extensive multilevel degenerative spondylosis and facet  arthrosis throughout the thoracic spine. Mild marrow edema involving the endplates at I2-9 through T8-9, favored to be degenerative in nature. Possible changes of early osteomyelitis discitis are difficult to exclude, although are felt to be unlikely.  Hypoxemia -resolved  Mild transaminitis -ultrasound without signs of acute cholecystitis  Essential hypertension -not sure how accurate her BP are as it is being checked in her ankle/lower leg -will add low dose BB-- had been on in the past  Chronic systolic heart failure -Hemodialysis per nephrology  Type 2 diabetes -Carb modified diet -Hemoglobin A1c 8.0% -SSI -increased long acting   ESRD on HD TTS -Nephrology consulted for hemodialysis  Anemia due to end-stage renal disease -Continue to monitor repeat CBC with no overt bleeding identified -Erythropoietin per nephrology   DVT prophylaxis: Heparin Code Status: Full Disposition Plan:  Status is: inpt  The patient will require care spanning > 2 midnights and should be moved to inpatient because: IV treatments appropriate due to intensity of illness or inability to take PO and Inpatient level of care appropriate due to severity of illness  Dispo: The patient is from: Home              Anticipated d/c is to: Home              Patient currently is not medically stable to d/c. Home in AM   Difficult to place patient No  Consultants:  Nephrology ID Dr. [facility]   Antimicrobials:  Anti-infectives (From admission, onward)    Start     Dose/Rate Route Frequency Ordered Stop   01/20/21 1200  vancomycin (VANCOREADY)  IVPB 750 mg/150 mL        750 mg 150 mL/hr over 60 Minutes Intravenous Every T-Th-Sa (Hemodialysis) 01/17/21 2106     01/18/21 2100  ceFEPIme (MAXIPIME) 1 g in sodium chloride 0.9 % 100 mL IVPB  Status:  Discontinued        1 g 200 mL/hr over 30 Minutes Intravenous Every 24 hours 01/17/21 2106 01/19/21 1532   01/18/21 0245  metroNIDAZOLE (FLAGYL) IVPB 500  mg  Status:  Discontinued       Note to Pharmacy: Hyperbilirubinemia with fever and suspicious finding on CT scan abdomen.   500 mg 100 mL/hr over 60 Minutes Intravenous Every 8 hours 01/18/21 0238 01/19/21 1532   01/17/21 2015  ceFEPIme (MAXIPIME) 2 g in sodium chloride 0.9 % 100 mL IVPB        2 g 200 mL/hr over 30 Minutes Intravenous  Once 01/17/21 2005 01/17/21 2055   01/17/21 2015  vancomycin (VANCOREADY) IVPB 1500 mg/300 mL        1,500 mg 150 mL/hr over 120 Minutes Intravenous  Once 01/17/21 2005 01/17/21 2252       Subjective: No complaints    Objective: Vitals:   01/24/21 0445 01/24/21 0500 01/24/21 0730 01/24/21 1117  BP: (!) 169/67  (!) 179/84 (!) 147/75  Pulse: (!) 107  (!) 108 100  Resp: 15  18 16   Temp: 98.5 F (36.9 C)  98.4 F (36.9 C) 97.8 F (36.6 C)  TempSrc: Oral  Oral Oral  SpO2: 98%  100%   Weight:  68.1 kg    Height:        Intake/Output Summary (Last 24 hours) at 01/24/2021 1218 Last data filed at 01/23/2021 2147 Gross per 24 hour  Intake 300 ml  Output --  Net 300 ml   Filed Weights   01/20/21 0535 01/20/21 1616 01/24/21 0500  Weight: 69.9 kg 69.9 kg 68.1 kg    Examination:   General: Appearance:     Overweight female in no acute distress     Lungs:     No wheezing, respirations unlabored  Heart:    Tachycardic. Normal rhythm. No murmurs, rubs, or gallops.   MS:   All extremities are intact.    Neurologic:   Awake, alert, oriented x 3. No apparent focal neurological           defect.      Data Reviewed: I have personally reviewed following labs and imaging studies  CBC: Recent Labs  Lab 01/17/21 1805 01/18/21 0545 01/19/21 0046 01/20/21 0214 01/22/21 0020  WBC 9.4 8.0 8.7 14.0* 13.5*  NEUTROABS 7.1 5.2  --   --   --   HGB 11.0* 10.0* 8.9* 9.5* 9.0*  HCT 31.1* 29.2* 25.0* 26.5* 25.3*  MCV 82.7 84.6 81.4 80.1 80.3  PLT PLATELET CLUMPS NOTED ON SMEAR, UNABLE TO ESTIMATE 79* 88* 141* 335   Basic Metabolic Panel: Recent  Labs  Lab 01/17/21 1805 01/18/21 0545 01/19/21 0046 01/20/21 0214 01/22/21 0020 01/22/21 0747  NA 137 133* 129* 129* 128*  --   K 4.1 3.9 3.8 4.0 4.2  --   CL 89* 89* 88* 87* 88*  --   CO2 30 28 26 25 25   --   GLUCOSE 159* 100* 346* 310* 291*  --   BUN 33* 38* 53* 64* 43*  --   CREATININE 6.43* 7.41* 8.83* 10.62* 8.36*  --   CALCIUM 8.9 8.7* 8.5* 8.4* 8.6*  --   MG  --   --   --  2.2  --   --   PHOS  --   --   --   --   --  4.4   GFR: Estimated Creatinine Clearance: 5.5 mL/min (A) (by C-G formula based on SCr of 8.36 mg/dL (H)). Liver Function Tests: Recent Labs  Lab 01/17/21 1805 01/18/21 0545 01/20/21 0214  AST 41 31 20  ALT 67* 46* 30  ALKPHOS 242* 196* 203*  BILITOT 2.1* 1.8* 1.1  PROT 7.1 5.6* 5.6*  ALBUMIN 3.3* 2.6* 2.2*   No results for input(s): LIPASE, AMYLASE in the last 168 hours. No results for input(s): AMMONIA in the last 168 hours. Coagulation Profile: Recent Labs  Lab 01/17/21 1805  INR 1.0   Cardiac Enzymes: No results for input(s): CKTOTAL, CKMB, CKMBINDEX, TROPONINI in the last 168 hours. BNP (last 3 results) No results for input(s): PROBNP in the last 8760 hours. HbA1C: No results for input(s): HGBA1C in the last 72 hours.  CBG: Recent Labs  Lab 01/23/21 1216 01/23/21 1706 01/23/21 2016 01/24/21 0728 01/24/21 1122  GLUCAP 374* 284* 174* 284* 259*   Lipid Profile: No results for input(s): CHOL, HDL, LDLCALC, TRIG, CHOLHDL, LDLDIRECT in the last 72 hours. Thyroid Function Tests: No results for input(s): TSH, T4TOTAL, FREET4, T3FREE, THYROIDAB in the last 72 hours. Anemia Panel: No results for input(s): VITAMINB12, FOLATE, FERRITIN, TIBC, IRON, RETICCTPCT in the last 72 hours. Sepsis Labs: Recent Labs  Lab 01/17/21 1805 01/17/21 2239 01/18/21 0545 01/19/21 0046  PROCALCITON  --   --  43.88 36.36  LATICACIDVEN 2.3* 1.3  --   --     Recent Results (from the past 240 hour(s))  Resp Panel by RT-PCR (Flu A&B, Covid)  Nasopharyngeal Swab     Status: None   Collection Time: 01/17/21  5:59 PM   Specimen: Nasopharyngeal Swab; Nasopharyngeal(NP) swabs in vial transport medium  Result Value Ref Range Status   SARS Coronavirus 2 by RT PCR NEGATIVE NEGATIVE Final    Comment: (NOTE) SARS-CoV-2 target nucleic acids are NOT DETECTED.  The SARS-CoV-2 RNA is generally detectable in upper respiratory specimens during the acute phase of infection. The lowest concentration of SARS-CoV-2 viral copies this assay can detect is 138 copies/mL. A negative result does not preclude SARS-Cov-2 infection and should not be used as the sole basis for treatment or other patient management decisions. A negative result may occur with  improper specimen collection/handling, submission of specimen other than nasopharyngeal swab, presence of viral mutation(s) within the areas targeted by this assay, and inadequate number of viral copies(<138 copies/mL). A negative result must be combined with clinical observations, patient history, and epidemiological information. The expected result is Negative.  Fact Sheet for Patients:  EntrepreneurPulse.com.au  Fact Sheet for Healthcare Providers:  IncredibleEmployment.be  This test is no t yet approved or cleared by the [place] FDA and  has been authorized for detection and/or diagnosis of SARS-CoV-2 by FDA under an Emergency Use Authorization (EUA). This EUA will remain  in effect (meaning this test can be used) for the duration of the COVID-19 declaration under Section 564(b)(1) of the Act, 21 U.S.C.section 360bbb-3(b)(1), unless the authorization is terminated  or revoked sooner.       Influenza A by PCR NEGATIVE NEGATIVE Final   Influenza B by PCR NEGATIVE NEGATIVE Final    Comment: (NOTE) The Xpert Xpress SARS-CoV-2/FLU/RSV plus assay is intended as an aid in the diagnosis of influenza from Nasopharyngeal swab specimens and should not be  used as a sole  basis for treatment. Nasal washings and aspirates are unacceptable for Xpert Xpress SARS-CoV-2/FLU/RSV testing.  Fact Sheet for Patients: EntrepreneurPulse.com.au  Fact Sheet for Healthcare Providers: IncredibleEmployment.be  This test is not yet approved or cleared by the [place] FDA and has been authorized for detection and/or diagnosis of SARS-CoV-2 by FDA under an Emergency Use Authorization (EUA). This EUA will remain in effect (meaning this test can be used) for the duration of the COVID-19 declaration under Section 564(b)(1) of the Act, 21 U.S.C. section 360bbb-3(b)(1), unless the authorization is terminated or revoked.  Performed at [facility], [place] [address], [place], [place] [address]   Blood culture (routine x 2)     Status: Abnormal (Preliminary result)   Collection Time: 01/17/21  8:46 PM   Specimen: BLOOD LEFT HAND  Result Value Ref Range Status   Specimen Description BLOOD LEFT HAND  Final   Special Requests   Final    BOTTLES DRAWN AEROBIC AND ANAEROBIC Blood Culture results may not be optimal due to an inadequate volume of blood received in culture bottles   Culture  Setup Time   Final    GRAM POSITIVE COCCI IN PAIRS AND CHAINS IN BOTH AEROBIC AND ANAEROBIC BOTTLES CRITICAL RESULT CALLED TO, READ BACK BY AND VERIFIED WITH: PHARM D J.ORIET ON [id number] AT 5102 BY E.PARRISH    Culture (A)  Final    GRANULICATELLA ADIACENS ORG POOR GROWTH CULTURE REINCUBATED FOR BETTER GROWTH Performed at [facility], [place] [address], [place], [place] [address]    Report Status PENDING  Incomplete  Blood culture (routine x 2)     Status: Abnormal (Preliminary result)   Collection Time: 01/17/21  8:46 PM   Specimen: BLOOD  Result Value Ref Range Status   Specimen Description BLOOD LEFT ANTECUBITAL  Final   Special Requests   Final    BOTTLES DRAWN AEROBIC AND ANAEROBIC Blood Culture results may not be  optimal due to an inadequate volume of blood received in culture bottles   Culture  Setup Time   Final    GRAM POSITIVE COCCI IN PAIRS AND CHAINS IN BOTH AEROBIC AND ANAEROBIC BOTTLES CRITICAL VALUE NOTED.  VALUE IS CONSISTENT WITH PREVIOUSLY REPORTED AND CALLED VALUE.    Culture (A)  Final    GRANULICATELLA ADIACENS Sent to [place] for further susceptibility testing. Performed at [facility], [place] [address], [place], [place] [address]    Report Status PENDING  Incomplete  Blood Culture ID Panel (Reflexed)     Status: None   Collection Time: 01/17/21  8:46 PM  Result Value Ref Range Status   Enterococcus faecalis NOT DETECTED NOT DETECTED Final   Enterococcus Faecium NOT DETECTED NOT DETECTED Final   Listeria monocytogenes NOT DETECTED NOT DETECTED Final   Staphylococcus species NOT DETECTED NOT DETECTED Final   Staphylococcus aureus (BCID) NOT DETECTED NOT DETECTED Final   Staphylococcus epidermidis NOT DETECTED NOT DETECTED Final   Staphylococcus lugdunensis NOT DETECTED NOT DETECTED Final   Streptococcus species NOT DETECTED NOT DETECTED Final   Streptococcus agalactiae NOT DETECTED NOT DETECTED Final   Streptococcus pneumoniae NOT DETECTED NOT DETECTED Final   Streptococcus pyogenes NOT DETECTED NOT DETECTED Final   A.calcoaceticus-baumannii NOT DETECTED NOT DETECTED Final   Bacteroides fragilis NOT DETECTED NOT DETECTED Final   Enterobacterales NOT DETECTED NOT DETECTED Final   Enterobacter cloacae complex NOT DETECTED NOT DETECTED Final   Escherichia coli NOT DETECTED NOT DETECTED Final   Klebsiella aerogenes NOT DETECTED  NOT DETECTED Final   Klebsiella oxytoca NOT DETECTED NOT DETECTED Final   Klebsiella pneumoniae NOT DETECTED NOT DETECTED Final   Proteus species NOT DETECTED NOT DETECTED Final   Salmonella species NOT DETECTED NOT DETECTED Final   Serratia marcescens NOT DETECTED NOT DETECTED Final   Haemophilus influenzae NOT DETECTED NOT DETECTED Final    Neisseria meningitidis NOT DETECTED NOT DETECTED Final   Pseudomonas aeruginosa NOT DETECTED NOT DETECTED Final   Stenotrophomonas maltophilia NOT DETECTED NOT DETECTED Final   Candida albicans NOT DETECTED NOT DETECTED Final   Candida auris NOT DETECTED NOT DETECTED Final   Candida glabrata NOT DETECTED NOT DETECTED Final   Candida krusei NOT DETECTED NOT DETECTED Final   Candida parapsilosis NOT DETECTED NOT DETECTED Final   Candida tropicalis NOT DETECTED NOT DETECTED Final   Cryptococcus neoformans/gattii NOT DETECTED NOT DETECTED Final    Comment: Performed at [facility], [place] [address], [place], [place] [address]  Culture, blood (routine x 2)     Status: None (Preliminary result)   Collection Time: 01/19/21  8:56 AM   Specimen: BLOOD LEFT HAND  Result Value Ref Range Status   Specimen Description BLOOD LEFT HAND  Final   Special Requests   Final    BOTTLES DRAWN AEROBIC ONLY Blood Culture results may not be optimal due to an inadequate volume of blood received in culture bottles   Culture   Final    NO GROWTH 3 DAYS Performed at [facility], [place] [address], [place], [place] [address]    Report Status PENDING  Incomplete  Culture, blood (routine x 2)     Status: None (Preliminary result)   Collection Time: 01/19/21  8:58 AM   Specimen: BLOOD LEFT HAND  Result Value Ref Range Status   Specimen Description BLOOD LEFT HAND  Final   Special Requests   Final    BOTTLES DRAWN AEROBIC ONLY Blood Culture results may not be optimal due to an inadequate volume of blood received in culture bottles   Culture   Final    NO GROWTH 3 DAYS Performed at [facility], [place] [address] [place], [place] [address]    Report Status PENDING  Incomplete  MRSA Next Gen by PCR, Nasal     Status: None   Collection Time: 01/20/21  5:31 AM   Specimen: Nasal Mucosa; Nasal Swab  Result Value Ref Range Status   MRSA by PCR Next Gen NOT DETECTED NOT DETECTED Final    Comment:  (NOTE) The GeneXpert MRSA Assay (FDA approved for NASAL specimens only), is one component of a comprehensive MRSA colonization surveillance program. It is not intended to diagnose MRSA infection nor to guide or monitor treatment for MRSA infections. Test performance is not FDA approved in patients less than 29 years old. Performed at [facility], [place] [address], [place], [place] [address]          Radiology Studies: DG CHEST PORT 1 VIEW  Result Date: 01/23/2021 CLINICAL DATA:  Cough. EXAM: PORTABLE CHEST 1 VIEW COMPARISON:  Chest radiograph and CTA 01/17/2021 FINDINGS: The cardiac silhouette remains mildly enlarged. The lungs are mildly hypoinflated with mild linear opacity in the left greater than right lung bases. No overt pulmonary edema, sizable pleural effusion, or pneumothorax is identified. Prior cervical spine fusion is noted. IMPRESSION: Bibasilar atelectasis. Electronically Signed   By: Logan Bores M.D.   On: 01/23/2021 14:30   ECHO TEE  Result Date: 01/22/2021  TRANSESOPHOGEAL ECHO REPORT   Patient Name:   Belinda [name] Date of Exam: 01/22/2021 Medical Rec #:  [id number]        Height:       60.0 in Accession #:    [id number]       Weight:       154.1 lb Date of Birth:  1951/03/04       BSA:          1.671 m Patient Age:    27 years         BP:           156/75 mmHg Patient Gender: F                HR:           96 bpm. Exam Location:  Inpatient Procedure: Transesophageal Echo and Cardiac Doppler Indications:     Bacteremia  History:         Patient has prior history of Echocardiogram examinations, most                  recent 01/19/2021. CHF, Pulmonary HTN; Risk                  Factors:Hypertension, Diabetes, Dyslipidemia and Non-Smoker.                  CKD.  Sonographer:     Vickie Epley RDCS Referring Phys:  [id number] Abigail Butts Diagnosing Phys: Skeet Latch MD PROCEDURE: The transesophogeal probe was passed without difficulty through the esophogus of the  patient. Sedation performed by different physician. The patient was monitored while under deep sedation. Anesthestetic sedation was provided intravenously by Anesthesiology: 201.12mg  of Propofol. The patient's vital signs; including heart rate, blood pressure, and oxygen saturation; remained stable throughout the procedure. The patient developed no complications during the procedure. IMPRESSIONS  1. Left ventricular ejection fraction, by estimation, is 60 to 65%. The left ventricle has normal function. The left ventricle has no regional wall motion abnormalities.  2. Right ventricular systolic function is normal. The right ventricular size is normal.  3. No left atrial/left atrial appendage thrombus was detected.  4. The mitral valve is normal in structure. Trivial mitral valve regurgitation. No evidence of mitral stenosis.  5. The aortic valve is tricuspid. Aortic valve regurgitation is trivial. No aortic stenosis is present.  6. The inferior vena cava is normal in size with greater than 50% respiratory variability, suggesting right atrial pressure of 3 mmHg. Conclusion(s)/Recommendation(s): Normal biventricular function without evidence of hemodynamically significant valvular heart disease. No evidence of vegetation/infective endocarditis on this transesophageal echocardiogram. FINDINGS  Left Ventricle: Left ventricular ejection fraction, by estimation, is 60 to 65%. The left ventricle has normal function. The left ventricle has no regional wall motion abnormalities. The left ventricular internal cavity size was normal in size. There is  no left ventricular hypertrophy. Right Ventricle: The right ventricular size is normal. No increase in right ventricular wall thickness. Right ventricular systolic function is normal. Left Atrium: Left atrial size was normal in size. No left atrial/left atrial appendage thrombus was detected. Right Atrium: Right atrial size was normal in size. Pericardium: Trivial pericardial  effusion is present. Mitral Valve: The mitral valve is normal in structure. Trivial mitral valve regurgitation. No evidence of mitral valve stenosis. Tricuspid Valve: The tricuspid valve is normal in structure. Tricuspid valve regurgitation is not demonstrated. No evidence of tricuspid stenosis. Aortic Valve: The aortic valve is tricuspid. Aortic valve  regurgitation is trivial. No aortic stenosis is present. Pulmonic Valve: The pulmonic valve was normal in structure. Pulmonic valve regurgitation is not visualized. No evidence of pulmonic stenosis. Aorta: The aortic root is normal in size and structure. Venous: The inferior vena cava is normal in size with greater than 50% respiratory variability, suggesting right atrial pressure of 3 mmHg. IAS/Shunts: No atrial level shunt detected by color flow Doppler. Skeet Latch MD Electronically signed by Skeet Latch MD Signature Date/Time: 01/22/2021/4:08:17 PM    Final         Scheduled Meds:  aspirin EC  81 mg Oral Once per day on Mon Wed Fri   calcitRIOL  1.5 mcg Oral Once per day on Tue Thu Sat   carvedilol  3.125 mg Oral BID WC   Chlorhexidine Gluconate Cloth  6 each Topical Daily   darbepoetin (ARANESP) injection - DIALYSIS  60 mcg Intravenous Q Thu-HD   docusate sodium  100 mg Oral BID   simvastatin  40 mg Oral QHS   And   ezetimibe  10 mg Oral QHS   ferric citrate  210 mg Oral TID WC   heparin injection (subcutaneous)  5,000 Units Subcutaneous Q8H   insulin aspart  0-9 Units Subcutaneous TID WC   insulin aspart  8 Units Subcutaneous TID WC   insulin glargine  18 Units Subcutaneous Daily   lidocaine  2 patch Transdermal Q24H   loratadine  10 mg Oral Daily   losartan  12.5 mg Oral Daily   midodrine  10 mg Oral Once per day on Tue Thu Sat   mometasone-formoterol  2 puff Inhalation BID   Continuous Infusions:  vancomycin 750 mg (01/22/21 1048)     LOS: 4 days    Time spent: 25 minutes    Geradine Girt, DO [facility]  If 7PM-7AM, please contact night-coverage [email or web address] 01/24/2021, 12:18 PM

## 2021-01-25 LAB — GLUCOSE, CAPILLARY
Glucose-Capillary: 307 mg/dL — ABNORMAL HIGH (ref 70–99)
Glucose-Capillary: 242 mg/dL — ABNORMAL HIGH (ref 70–99)

## 2021-01-25 MED ORDER — OXYCODONE HCL 5 MG PO TABS: 5.0000 mg | ORAL_TABLET | Freq: Four times a day (QID) | ORAL | 0 refills | Status: DC | PRN

## 2021-01-25 MED ORDER — LIDOCAINE 5 % EX PTCH: 2.0000 | MEDICATED_PATCH | CUTANEOUS | 0 refills | Status: DC

## 2021-01-25 MED ORDER — VANCOMYCIN HCL 750 MG/150ML IV SOLN: 750.0000 mg | INTRAVENOUS | Status: AC

## 2021-01-25 MED ORDER — INSULIN GLARGINE 100 UNIT/ML ~~LOC~~ SOLN: 25.0000 [IU] | Freq: Every day | SUBCUTANEOUS | Status: DC

## 2021-01-25 MED ORDER — DOCUSATE SODIUM 100 MG PO CAPS: 100.0000 mg | ORAL_CAPSULE | Freq: Two times a day (BID) | ORAL | 0 refills | Status: DC

## 2021-01-25 NOTE — Progress Notes (Signed)
Belinda [name] to be D/C'd Home per MD order.  Discussed with the patient and all questions fully answered.  VSS, Skin clean, dry and intact without evidence of skin break down, no evidence of skin tears noted. IV catheter discontinued intact. Site without signs and symptoms of complications. Dressing and pressure applied.  An After Visit Summary was printed and given to the patient. Patient received prescription.  D/c education completed with patient including follow up instructions, medication list, d/c activities limitations if indicated, with other d/c instructions as indicated by MD - patient able to verbalize understanding, all questions fully answered.   Patient instructed to return to ED, call 911, or call MD for any changes in condition.   Patient escorted via [place], and D/C home via private auto.

## 2021-01-25 NOTE — Discharge Summary (Signed)
Physician Discharge Summary  Belinda [name]:[id number] DOB: [birth year] DOA: 01/17/2021  PCP: Belinda Bowen, MD  Admit date: 01/17/2021 Discharge date: 01/25/2021  Admitted From: home Discharge disposition: home   Recommendations for Outpatient Follow-Up:   [facility] with HD through 8/5 [facility] ID follow up-- monitor back pain   Discharge Diagnosis:   Principal Problem:   Sepsis due to undetermined organism [name]) Active Problems:   Essential hypertension   Chronic systolic (congestive) heart failure (HCC)   DM (diabetes mellitus), type 2 with renal complications (Belinda [name])   ESRD on hemodialysis (Belinda [name])   Anemia due to end stage renal disease (Belinda [name])   Abnormal liver function tests   Hypoxia   Bacteremia    Discharge Condition: Improved.  Diet recommendation: renal/carb mod  Wound care: None.  Code status: Full.   History of Present Illness:   Belinda [name] is a 70 y.o. female with medical history significant of chronic systolic heart failure, ESRD anemia, ESRD on hemodialysis, anxiety/depression, chronic cervical and lumbar back pain, GERD, history of unspecified MRSA infection, hyperlipidemia, hypertension, migraine headaches, right hand nerve damage, palpitations, history of pneumonia twice in 2018 and 2019, pulmonary artery hypertension, seasonal allergies, insulin-dependent type II DM who is coming to the emergency department with hypoxia in the high 80s and fever after having dialysis today.  She mentioned that she has been sick for about a week.  She had nausea and multiple episodes of emesis on Sunday, but denied having abdominal pain, melena, hematochezia constipation or diarrhea that day.  However, she mentioned that on Wednesday she had multiple episodes of diarrhea throughout the day.  Her appetite has been decreased.  She has missed Tuesday and Thursday's dialysis, but was able to complete the session today.  However, after finishing HD she has not felt  well and her generalized weakness became worse.  She was febrile in triage, but denies subjective fever, chills, night sweats, rhinorrhea, sore throat or wheezing.  She has occasional cough due to postnasal drip, but denied productive cough over the past week.  No chest pain, palpitations, diaphoresis, PND, orthopnea, recent lower extremity edema but has been lightheaded at times.  She urinates on frequently and denied dysuria, frequency or hematuria.   [name] Course by Problem:   Sepsis present on admission secondary to granulicatella adiacens -Related to strep variant -2D echocardiogram: grade 1 diastolic -s/p TEE w/o endocarditis -ID consult appreciated: Would treat her for total 10 days vancomycin from 7/25, and follow up with ID in 2-3 weeks off antibiotics -worsening mid-back pain-- thoracic MRI: Extensive multilevel degenerative spondylosis and facet arthrosis throughout the thoracic spine. Mild marrow edema involving the endplates at A2-1 through T8-9, favored to be degenerative in nature. Possible changes of early osteomyelitis discitis are difficult to exclude, although are felt to be unlikely.   Hypoxemia -resolved -continue incentive spirometry   Mild transaminitis -ultrasound without signs of acute cholecystitis   Essential hypertension -BP on arm better than ankle where it was being checked   Chronic systolic heart failure -Hemodialysis per nephrology   Type 2 diabetes -resume home meds   ESRD on HD TTS -Nephrology consulted for hemodialysis   Anemia due to end-stage renal disease -Continue to monitor repeat CBC with no overt bleeding identified -Erythropoietin per nephrology      Medical Consultants:   ID Cards neuro   Discharge Exam:   Vitals:   01/25/21 0727 01/25/21 0843  BP: (!) 157/62   Pulse: 93  Resp: 17   Temp: 98.3 F (36.8 C)   SpO2: 99% 93%   Vitals:   01/25/21 0400 01/25/21 0500 01/25/21 0727 01/25/21 0843  BP: (!) 156/72  (!)  157/62   Pulse: 95  93   Resp: 16  17   Temp: 98.2 F (36.8 C)  98.3 F (36.8 C)   TempSrc: Oral  Oral   SpO2: 100%  99% 93%  Weight:  68.9 kg    Height:        General exam: Appears calm and comfortable.  The results of significant diagnostics from this hospitalization (including imaging, microbiology, ancillary and laboratory) are listed below for reference.     Procedures and Diagnostic Studies:   DG Chest 1 View  Result Date: 01/17/2021 CLINICAL DATA:  Weakness. EXAM: CHEST  1 VIEW COMPARISON:  March 19, 2019. FINDINGS: Stable cardiomediastinal silhouette. No pneumothorax is noted. Hypoinflation of the lungs is noted with minimal bibasilar subsegmental atelectasis. Bony thorax is unremarkable. IMPRESSION: Hypoinflation of the lungs is noted with minimal bibasilar subsegmental atelectasis. Electronically Signed   By: Marijo Conception M.D.   On: 01/17/2021 18:29   CT Angio Chest PE W and/or Wo Contrast  Addendum Date: 01/18/2021   ADDENDUM REPORT: 01/18/2021 00:36 ADDENDUM: 8 mm ground-glass opacity seen in the left lung apex. Possibly an acute infectious or inflammatory process though neoplasm is not fully excluded. Initial follow-up with CT at 6-12 months is recommended to confirm persistence. If persistent, repeat CT is recommended every 2 years until 5 years of stability has been established. This recommendation follows the consensus statement: Guidelines for Management of Incidental Pulmonary Nodules Detected on CT Images: From the Fleischner Society 2017; Radiology 2017; 284:228-243. Electronically Signed   By: Lovena Le M.D.   On: 01/18/2021 00:36   Result Date: 01/18/2021 CLINICAL DATA:  Chest discomfort, nausea and vomiting EXAM: CT ANGIOGRAPHY CHEST CT ABDOMEN AND PELVIS WITH CONTRAST TECHNIQUE: Multidetector CT imaging of the chest was performed using the standard protocol during bolus administration of intravenous contrast. Multiplanar CT image reconstructions and MIPs  were obtained to evaluate the vascular anatomy. Multidetector CT imaging of the abdomen and pelvis was performed using the standard protocol during bolus administration of intravenous contrast. CONTRAST:  8mL OMNIPAQUE IOHEXOL 350 MG/ML SOLN COMPARISON:  Radiograph 01/17/2021 some ultrasound abdomen 04/21/2018 FINDINGS: CTA CHEST FINDINGS Cardiovascular: Satisfactory opacification the pulmonary arteries to the segmental level. No pulmonary artery filling defects are identified. Central pulmonary arteries are normal caliber. Cardiomegaly. Small pericardial effusion. Suboptimal opacification of the thoracic aorta for luminal assessment. Atherosclerotic plaque within the normal caliber aorta. No gross acute aortic abnormality is evident. Normal 3 vessel branching of the aortic arch. Proximal great vessels are unremarkable. Mediastinum/Nodes: No mediastinal fluid or gas. Normal thyroid gland and thoracic inlet. No acute abnormality of the trachea or esophagus. No worrisome mediastinal, hilar or axillary adenopathy. Lungs/Pleura: Low lung volumes and mixed dependent and subsegmental atelectatic changes. 8 mm ground-glass opacity seen in left upper lobe (4/25). No other consolidative opacities in the lungs. No pneumothorax. No layering effusion. No other concerning nodules or masses. Musculoskeletal: No acute osseous abnormality or suspicious osseous lesion. Exaggerated thoracic kyphosis with multilevel bony fusion across the vertebral bodies and posterior elements noted T9-T12 as well as more pronounced spondylitic changes at the adjacent T7-T8, T8-T9 levels. Prior cervical fusion noted as well. No acute or worrisome osseous abnormality. Minimal degenerative changes of the shoulders. No worrisome chest wall mass or lesion. Diffuse body wall edema.  Review of the MIP images confirms the above findings. CT ABDOMEN and PELVIS FINDINGS Hepatobiliary: No worrisome focal liver lesions. Smooth liver surface contour. Normal  hepatic attenuation. Gallbladder is mildly distended with some layering hyperdense material, could reflect biliary sand or if patient has received additional contrast media in recent days, possible vicarious extravasation of contrast media. No visible intraductal gallstones or significant biliary ductal dilatation. Pancreas: Normal tapering of the distal common bile duct. No pancreatic inflammation or discernible pancreatic lesion. Spleen: Normal in size. No concerning splenic lesions. Small accessory splenule posteriorly. Adrenals/Urinary Tract: Normal adrenals. Kidneys are normally located with symmetric enhancement and excretion. Fluid attenuation cyst seen in the upper pole right kidney measuring up to 2.2 cm in size. No suspicious renal lesion, urolithiasis or hydronephrosis. Urinary bladder is largely decompressed at the time of exam and therefore poorly evaluated by CT imaging. No gross bladder abnormality accounting for underdistention. Stomach/Bowel: Distal esophagus unremarkable. Stomach and duodenum are free of acute abnormality with a normal duodenal sweep across the midline abdomen. No small bowel thickening or dilatation. No evidence of high-grade small bowel obstruction. No clear colonic thickening or dilatation accounting for varying degrees of underdistention. Appendix is not visualized. No focal inflammation the vicinity of the cecum to suggest an occult appendicitis. Vascular/Lymphatic: Atherosclerotic calcifications within the abdominal aorta and branch vessels. No aneurysm or ectasia. Small amount of fluid seen surrounding the origin of a right common femoral arteriovenous fistula graft (6/68). No pathologically enlarged abdominopelvic lymph nodes. Reproductive: Retroverted uterus with calcified uterine fibroids. No concerning adnexal mass. Other: Diffuse body wall edema. Mild edematous changes in the mesentery. No abdominopelvic free air or fluid. No bowel containing hernia. Postsurgical  changes of the anterior abdominal wall including a small umbilical hernia into which protrudes portion of the anti mesenteric transverse colon (6/39) albeit without evidence of vascular compromise or obstruction at this level. Musculoskeletal: Diffuse osseous manifestations of renal osteodystrophy. Grade 2 anterolisthesis L4 on L5 without spondylolysis. Multilevel discogenic and facet degenerative changes are maximal at the L4-5 level as well. Additional degenerative changes in the bilateral hips and additional arthrosis at the SI joints and symphysis pubis. Review of the MIP images confirms the above findings. IMPRESSION: 1. No evidence of pulmonary artery embolism. 2. Cardiomegaly and small pericardial effusion. 3. Diffuse body wall edema could reflect early volume overload/anasarca. 4. Distended gallbladder with layering hyperdensity, could reflect tiny biliary stones/sand or vicarious extravasation of contrast if patient has had a recent contrasted exam. While there is no pericholecystic fluid or inflammation, could correlate for right upper quadrant symptoms as isolated distension can be an early sign of developing cholecystitis. No biliary ductal dilatation or intraductal gallstones. 5. Small amount of fluid surrounding the anastomosis of a right femoral AV fistula in the right inguinal chain. Could reflect small chronic postoperative seroma though should correlate fistula function and local assessment of the inguinal region. 6. Small broad-based umbilical hernia into which protrudes portion of the anti mesenteric transverse colon without evidence of vascular compromise or obstruction. 7. Diffuse osseous manifestations of renal osteodystrophy. Electronically Signed: By: Lovena Le M.D. On: 01/18/2021 00:18   CT ABDOMEN PELVIS W CONTRAST  Addendum Date: 01/18/2021   ADDENDUM REPORT: 01/18/2021 00:36 ADDENDUM: 8 mm ground-glass opacity seen in the left lung apex. Possibly an acute infectious or  inflammatory process though neoplasm is not fully excluded. Initial follow-up with CT at 6-12 months is recommended to confirm persistence. If persistent, repeat CT is recommended every 2 years until 5  years of stability has been established. This recommendation follows the consensus statement: Guidelines for Management of Incidental Pulmonary Nodules Detected on CT Images: From the Fleischner Society 2017; Radiology 2017; 284:228-243. Electronically Signed   By: Lovena Le M.D.   On: 01/18/2021 00:36   Result Date: 01/18/2021 CLINICAL DATA:  Chest discomfort, nausea and vomiting EXAM: CT ANGIOGRAPHY CHEST CT ABDOMEN AND PELVIS WITH CONTRAST TECHNIQUE: Multidetector CT imaging of the chest was performed using the standard protocol during bolus administration of intravenous contrast. Multiplanar CT image reconstructions and MIPs were obtained to evaluate the vascular anatomy. Multidetector CT imaging of the abdomen and pelvis was performed using the standard protocol during bolus administration of intravenous contrast. CONTRAST:  12mL OMNIPAQUE IOHEXOL 350 MG/ML SOLN COMPARISON:  Radiograph 01/17/2021 some ultrasound abdomen 04/21/2018 FINDINGS: CTA CHEST FINDINGS Cardiovascular: Satisfactory opacification the pulmonary arteries to the segmental level. No pulmonary artery filling defects are identified. Central pulmonary arteries are normal caliber. Cardiomegaly. Small pericardial effusion. Suboptimal opacification of the thoracic aorta for luminal assessment. Atherosclerotic plaque within the normal caliber aorta. No gross acute aortic abnormality is evident. Normal 3 vessel branching of the aortic arch. Proximal great vessels are unremarkable. Mediastinum/Nodes: No mediastinal fluid or gas. Normal thyroid gland and thoracic inlet. No acute abnormality of the trachea or esophagus. No worrisome mediastinal, hilar or axillary adenopathy. Lungs/Pleura: Low lung volumes and mixed dependent and subsegmental  atelectatic changes. 8 mm ground-glass opacity seen in left upper lobe (4/25). No other consolidative opacities in the lungs. No pneumothorax. No layering effusion. No other concerning nodules or masses. Musculoskeletal: No acute osseous abnormality or suspicious osseous lesion. Exaggerated thoracic kyphosis with multilevel bony fusion across the vertebral bodies and posterior elements noted T9-T12 as well as more pronounced spondylitic changes at the adjacent T7-T8, T8-T9 levels. Prior cervical fusion noted as well. No acute or worrisome osseous abnormality. Minimal degenerative changes of the shoulders. No worrisome chest wall mass or lesion. Diffuse body wall edema. Review of the MIP images confirms the above findings. CT ABDOMEN and PELVIS FINDINGS Hepatobiliary: No worrisome focal liver lesions. Smooth liver surface contour. Normal hepatic attenuation. Gallbladder is mildly distended with some layering hyperdense material, could reflect biliary sand or if patient has received additional contrast media in recent days, possible vicarious extravasation of contrast media. No visible intraductal gallstones or significant biliary ductal dilatation. Pancreas: Normal tapering of the distal common bile duct. No pancreatic inflammation or discernible pancreatic lesion. Spleen: Normal in size. No concerning splenic lesions. Small accessory splenule posteriorly. Adrenals/Urinary Tract: Normal adrenals. Kidneys are normally located with symmetric enhancement and excretion. Fluid attenuation cyst seen in the upper pole right kidney measuring up to 2.2 cm in size. No suspicious renal lesion, urolithiasis or hydronephrosis. Urinary bladder is largely decompressed at the time of exam and therefore poorly evaluated by CT imaging. No gross bladder abnormality accounting for underdistention. Stomach/Bowel: Distal esophagus unremarkable. Stomach and duodenum are free of acute abnormality with a normal duodenal sweep across the  midline abdomen. No small bowel thickening or dilatation. No evidence of high-grade small bowel obstruction. No clear colonic thickening or dilatation accounting for varying degrees of underdistention. Appendix is not visualized. No focal inflammation the vicinity of the cecum to suggest an occult appendicitis. Vascular/Lymphatic: Atherosclerotic calcifications within the abdominal aorta and branch vessels. No aneurysm or ectasia. Small amount of fluid seen surrounding the origin of a right common femoral arteriovenous fistula graft (6/68). No pathologically enlarged abdominopelvic lymph nodes. Reproductive: Retroverted uterus with calcified uterine fibroids.  No concerning adnexal mass. Other: Diffuse body wall edema. Mild edematous changes in the mesentery. No abdominopelvic free air or fluid. No bowel containing hernia. Postsurgical changes of the anterior abdominal wall including a small umbilical hernia into which protrudes portion of the anti mesenteric transverse colon (6/39) albeit without evidence of vascular compromise or obstruction at this level. Musculoskeletal: Diffuse osseous manifestations of renal osteodystrophy. Grade 2 anterolisthesis L4 on L5 without spondylolysis. Multilevel discogenic and facet degenerative changes are maximal at the L4-5 level as well. Additional degenerative changes in the bilateral hips and additional arthrosis at the SI joints and symphysis pubis. Review of the MIP images confirms the above findings. IMPRESSION: 1. No evidence of pulmonary artery embolism. 2. Cardiomegaly and small pericardial effusion. 3. Diffuse body wall edema could reflect early volume overload/anasarca. 4. Distended gallbladder with layering hyperdensity, could reflect tiny biliary stones/sand or vicarious extravasation of contrast if patient has had a recent contrasted exam. While there is no pericholecystic fluid or inflammation, could correlate for right upper quadrant symptoms as isolated distension  can be an early sign of developing cholecystitis. No biliary ductal dilatation or intraductal gallstones. 5. Small amount of fluid surrounding the anastomosis of a right femoral AV fistula in the right inguinal chain. Could reflect small chronic postoperative seroma though should correlate fistula function and local assessment of the inguinal region. 6. Small broad-based umbilical hernia into which protrudes portion of the anti mesenteric transverse colon without evidence of vascular compromise or obstruction. 7. Diffuse osseous manifestations of renal osteodystrophy. Electronically Signed: By: Lovena Le M.D. On: 01/18/2021 00:18   US Abdomen Limited RUQ (LIVER/GB)  Result Date: 01/18/2021 CLINICAL DATA:  Hyperbilirubinemia with gallbladder debris seen on recent CT. EXAM: ULTRASOUND ABDOMEN LIMITED RIGHT UPPER QUADRANT COMPARISON:  April 21, 2018 FINDINGS: Gallbladder: A layer of shadowing, markedly echogenic material is seen within the dependent portion of a markedly distended gallbladder lumen (11.2 cm in length). This corresponds to the findings seen on the prior abdomen pelvis CT. No gallbladder thickening is visualized (2.3 mm). No sonographic Murphy sign noted by sonographer. Common bile duct: Diameter: 3.7 mm Liver: No focal lesion identified. The liver parenchyma is mildly nodular in contour and within normal limits in parenchymal echogenicity. Portal vein is patent on color Doppler imaging with normal direction of blood flow towards the liver. Other: The study is limited secondary to overlying bowel gas and rapid patient breathing. IMPRESSION: 1. Echogenic material within the gallbladder lumen which may represent milk of calcium versus retained contrast from a recent study. 2. No acute cholecystitis. Electronically Signed   By: Virgina Norfolk M.D.   On: 01/18/2021 03:48     Labs:   Basic Metabolic Panel: Recent Labs  Lab 01/19/21 0046 01/20/21 0214 01/22/21 0020 01/22/21 0747  NA  129* 129* 128*  --   K 3.8 4.0 4.2  --   CL 88* 87* 88*  --   CO2 26 25 25   --   GLUCOSE 346* 310* 291*  --   BUN 53* 64* 43*  --   CREATININE 8.83* 10.62* 8.36*  --   CALCIUM 8.5* 8.4* 8.6*  --   MG  --  2.2  --   --   PHOS  --   --   --  4.4   GFR Estimated Creatinine Clearance: 5.5 mL/min (A) (by C-G formula based on SCr of 8.36 mg/dL (H)). Liver Function Tests: Recent Labs  Lab 01/20/21 0214  AST 20  ALT 30  ALKPHOS  203*  BILITOT 1.1  PROT 5.6*  ALBUMIN 2.2*   No results for input(s): LIPASE, AMYLASE in the last 168 hours. No results for input(s): AMMONIA in the last 168 hours. Coagulation profile No results for input(s): INR, PROTIME in the last 168 hours.  CBC: Recent Labs  Lab 01/19/21 0046 01/20/21 0214 01/22/21 0020  WBC 8.7 14.0* 13.5*  HGB 8.9* 9.5* 9.0*  HCT 25.0* 26.5* 25.3*  MCV 81.4 80.1 80.3  PLT 88* 141* 234   Cardiac Enzymes: No results for input(s): CKTOTAL, CKMB, CKMBINDEX, TROPONINI in the last 168 hours. BNP: Invalid input(s): POCBNP CBG: Recent Labs  Lab 01/24/21 0728 01/24/21 1122 01/24/21 1733 01/24/21 2027 01/25/21 0726  GLUCAP 284* 259* 174* 204* 307*   D-Dimer No results for input(s): DDIMER in the last 72 hours. Hgb A1c No results for input(s): HGBA1C in the last 72 hours. Lipid Profile No results for input(s): CHOL, HDL, LDLCALC, TRIG, CHOLHDL, LDLDIRECT in the last 72 hours. Thyroid function studies No results for input(s): TSH, T4TOTAL, T3FREE, THYROIDAB in the last 72 hours.  Invalid input(s): FREET3 Anemia work up No results for input(s): VITAMINB12, FOLATE, FERRITIN, TIBC, IRON, RETICCTPCT in the last 72 hours. Microbiology Recent Results (from the past 240 hour(s))  Resp Panel by RT-PCR (Flu A&B, Covid) Nasopharyngeal Swab     Status: None   Collection Time: 01/17/21  5:59 PM   Specimen: Nasopharyngeal Swab; Nasopharyngeal(NP) swabs in vial transport medium  Result Value Ref Range Status   SARS Coronavirus 2  by RT PCR NEGATIVE NEGATIVE Final    Comment: (NOTE) SARS-CoV-2 target nucleic acids are NOT DETECTED.  The SARS-CoV-2 RNA is generally detectable in upper respiratory specimens during the acute phase of infection. The lowest concentration of SARS-CoV-2 viral copies this assay can detect is 138 copies/mL. A negative result does not preclude SARS-Cov-2 infection and should not be used as the sole basis for treatment or other patient management decisions. A negative result may occur with  improper specimen collection/handling, submission of specimen other than nasopharyngeal swab, presence of viral mutation(s) within the areas targeted by this assay, and inadequate number of viral copies(<138 copies/mL). A negative result must be combined with clinical observations, patient history, and epidemiological information. The expected result is Negative.  Fact Sheet for Patients:  EntrepreneurPulse.com.au  Fact Sheet for Healthcare Providers:  IncredibleEmployment.be  This test is no t yet approved or cleared by the [place] FDA and  has been authorized for detection and/or diagnosis of SARS-CoV-2 by FDA under an Emergency Use Authorization (EUA). This EUA will remain  in effect (meaning this test can be used) for the duration of the COVID-19 declaration under Section 564(b)(1) of the Act, 21 U.S.C.section 360bbb-3(b)(1), unless the authorization is terminated  or revoked sooner.       Influenza A by PCR NEGATIVE NEGATIVE Final   Influenza B by PCR NEGATIVE NEGATIVE Final    Comment: (NOTE) The Xpert Xpress SARS-CoV-2/FLU/RSV plus assay is intended as an aid in the diagnosis of influenza from Nasopharyngeal swab specimens and should not be used as a sole basis for treatment. Nasal washings and aspirates are unacceptable for Xpert Xpress SARS-CoV-2/FLU/RSV testing.  Fact Sheet for Patients: EntrepreneurPulse.com.au  Fact  Sheet for Healthcare Providers: IncredibleEmployment.be  This test is not yet approved or cleared by the [place] FDA and has been authorized for detection and/or diagnosis of SARS-CoV-2 by FDA under an Emergency Use Authorization (EUA). This EUA will remain in effect (meaning this test can  be used) for the duration of the COVID-19 declaration under Section 564(b)(1) of the Act, 21 U.S.C. section 360bbb-3(b)(1), unless the authorization is terminated or revoked.  Performed at [name], [place] [address] [place], [place] [address]   Blood culture (routine x 2)     Status: Abnormal (Preliminary result)   Collection Time: 01/17/21  8:46 PM   Specimen: BLOOD LEFT HAND  Result Value Ref Range Status   Specimen Description BLOOD LEFT HAND  Final   Special Requests   Final    BOTTLES DRAWN AEROBIC AND ANAEROBIC Blood Culture results may not be optimal due to an inadequate volume of blood received in culture bottles   Culture  Setup Time   Final    GRAM POSITIVE COCCI IN PAIRS AND CHAINS IN BOTH AEROBIC AND ANAEROBIC BOTTLES CRITICAL RESULT CALLED TO, READ BACK BY AND VERIFIED WITH: PHARM D J.ORIET ON [id number] AT [id number] BY E.PARRISH    Culture (A)  Final    GRANULICATELLA ADIACENS ORG POOR GROWTH CULTURE REINCUBATED FOR BETTER GROWTH Performed at [name], [place] [address] [place], [place] [address]    Report Status PENDING  Incomplete  Blood culture (routine x 2)     Status: Abnormal (Preliminary result)   Collection Time: 01/17/21  8:46 PM   Specimen: BLOOD  Result Value Ref Range Status   Specimen Description BLOOD LEFT ANTECUBITAL  Final   Special Requests   Final    BOTTLES DRAWN AEROBIC AND ANAEROBIC Blood Culture results may not be optimal due to an inadequate volume of blood received in culture bottles   Culture  Setup Time   Final    GRAM POSITIVE COCCI IN PAIRS AND CHAINS IN BOTH AEROBIC AND ANAEROBIC BOTTLES CRITICAL VALUE NOTED.   VALUE IS CONSISTENT WITH PREVIOUSLY REPORTED AND CALLED VALUE.    Culture (A)  Final    GRANULICATELLA ADIACENS Sent to [facility] for further susceptibility testing. Performed at [name], [place] [address] [place], [place] [address]    Report Status PENDING  Incomplete  Blood Culture ID Panel (Reflexed)     Status: None   Collection Time: 01/17/21  8:46 PM  Result Value Ref Range Status   Enterococcus faecalis NOT DETECTED NOT DETECTED Final   Enterococcus Faecium NOT DETECTED NOT DETECTED Final   Listeria monocytogenes NOT DETECTED NOT DETECTED Final   Staphylococcus species NOT DETECTED NOT DETECTED Final   Staphylococcus aureus (BCID) NOT DETECTED NOT DETECTED Final   Staphylococcus epidermidis NOT DETECTED NOT DETECTED Final   Staphylococcus lugdunensis NOT DETECTED NOT DETECTED Final   Streptococcus species NOT DETECTED NOT DETECTED Final   Streptococcus agalactiae NOT DETECTED NOT DETECTED Final   Streptococcus pneumoniae NOT DETECTED NOT DETECTED Final   Streptococcus pyogenes NOT DETECTED NOT DETECTED Final   A.calcoaceticus-baumannii NOT DETECTED NOT DETECTED Final   Bacteroides fragilis NOT DETECTED NOT DETECTED Final   Enterobacterales NOT DETECTED NOT DETECTED Final   Enterobacter cloacae complex NOT DETECTED NOT DETECTED Final   Escherichia coli NOT DETECTED NOT DETECTED Final   Klebsiella aerogenes NOT DETECTED NOT DETECTED Final   Klebsiella oxytoca NOT DETECTED NOT DETECTED Final   Klebsiella pneumoniae NOT DETECTED NOT DETECTED Final   Proteus species NOT DETECTED NOT DETECTED Final   Salmonella species NOT DETECTED NOT DETECTED Final   Serratia marcescens NOT DETECTED NOT DETECTED Final   Haemophilus influenzae NOT DETECTED NOT DETECTED Final   Neisseria meningitidis NOT DETECTED NOT DETECTED Final   Pseudomonas  aeruginosa NOT DETECTED NOT DETECTED Final   Stenotrophomonas maltophilia NOT DETECTED NOT DETECTED Final   Candida albicans NOT DETECTED NOT  DETECTED Final   Candida auris NOT DETECTED NOT DETECTED Final   Candida glabrata NOT DETECTED NOT DETECTED Final   Candida krusei NOT DETECTED NOT DETECTED Final   Candida parapsilosis NOT DETECTED NOT DETECTED Final   Candida tropicalis NOT DETECTED NOT DETECTED Final   Cryptococcus neoformans/gattii NOT DETECTED NOT DETECTED Final    Comment: Performed at [name], [place] [address] [place], [place] [address]  Culture, blood (routine x 2)     Status: None   Collection Time: 01/19/21  8:56 AM   Specimen: BLOOD LEFT HAND  Result Value Ref Range Status   Specimen Description BLOOD LEFT HAND  Final   Special Requests   Final    BOTTLES DRAWN AEROBIC ONLY Blood Culture results may not be optimal due to an inadequate volume of blood received in culture bottles   Culture   Final    NO GROWTH 5 DAYS Performed at [name], [place] [address], [place], [place] [address]    Report Status 01/24/2021 FINAL  Final  Culture, blood (routine x 2)     Status: None   Collection Time: 01/19/21  8:58 AM   Specimen: BLOOD LEFT HAND  Result Value Ref Range Status   Specimen Description BLOOD LEFT HAND  Final   Special Requests   Final    BOTTLES DRAWN AEROBIC ONLY Blood Culture results may not be optimal due to an inadequate volume of blood received in culture bottles   Culture   Final    NO GROWTH 5 DAYS Performed at [name], [place] [address], [place], [place] [address]    Report Status 01/24/2021 FINAL  Final  MRSA Next Gen by PCR, Nasal     Status: None   Collection Time: 01/20/21  5:31 AM   Specimen: Nasal Mucosa; Nasal Swab  Result Value Ref Range Status   MRSA by PCR Next Gen NOT DETECTED NOT DETECTED Final    Comment: (NOTE) The GeneXpert MRSA Assay (FDA approved for NASAL specimens only), is one component of a comprehensive MRSA colonization surveillance program. It is not intended to diagnose MRSA infection nor to guide or monitor treatment for MRSA  infections. Test performance is not FDA approved in patients less than 54 years old. Performed at [name], [place] [address], [place], [place] [address]      Discharge Instructions:   Discharge Instructions     Diet - low sodium heart healthy   Complete by: As directed    Diet Carb Modified   Complete by: As directed    Discharge instructions   Complete by: As directed    Monitor blood sugars and bring log to PCP for adjustment Vancomycin will be given at HD   Increase activity slowly   Complete by: As directed       Allergies as of 01/25/2021       Reactions   Doxycycline Other (See Comments)   [name] [sacubitril-valsartan] Itching   Metformin Other (See Comments)   weakness   Omnicef [cefdinir] Other (See Comments)   GI Upset   Aspirin Other (See Comments)   GI issues, Can tolerate low aspirin   Augmentin [amoxicillin-pot Clavulanate] Nausea And Vomiting, Other (See Comments)   GI issues DID THE REACTION INVOLVE: Swelling of the face/tongue/throat, SOB, or low BP? No Sudden or severe  rash/hives, skin peeling, or the inside of the mouth or nose? No Did it require medical treatment? No When did it last happen?      long time  If all above answers are "NO", may proceed with cephalosporin use.   Erythromycin Nausea And Vomiting, Other (See Comments)   Gi issues   Gabapentin Swelling   Nickel Itching, Rash   Breakouts         Medication List     STOP taking these medications    IRON SUCROSE IV       TAKE these medications    acetaminophen 650 MG CR tablet Commonly known as: TYLENOL Take 650-1,300 mg by mouth every 8 (eight) hours as needed for pain (pain).   ALPRAZolam 0.5 MG tablet Commonly known as: XANAX Take 0.25-0.5 mg by mouth 2 (two) times daily as needed for anxiety.   aspirin EC 81 MG tablet Take 81 mg by mouth 3 (three) times a week. Swallow whole.   Azelastine HCl 0.15 % Soln Place 2 sprays into both nostrils daily.    BEANO PO Take 1-2 tablets by mouth daily as needed (gas). With certain foods.   budesonide-formoterol 160-4.5 MCG/ACT inhaler Commonly known as: SYMBICORT Inhale 2 puffs into the lungs 2 (two) times daily as needed (cough/respiratory issues.).   diclofenac sodium 1 % Gel Commonly known as: VOLTAREN Apply 1 application topically 4 (four) times daily as needed (neck pain).   docusate sodium 100 MG capsule Commonly known as: COLACE Take 1 capsule (100 mg total) by mouth 2 (two) times daily.   ezetimibe-simvastatin 10-40 MG tablet Commonly known as: VYTORIN Take 1 tablet by mouth at bedtime.   ferric citrate 1 GM 210 MG(Fe) tablet Commonly known as: AURYXIA Take 210-420 mg by mouth See admin instructions. Take 2 tablets (420 mg) by mouth 3 times daily with meals & take 1 tablet (210 mg) by mouth with each snack   fexofenadine 180 MG tablet Commonly known as: ALLEGRA Take 180 mg by mouth daily.   guaiFENesin 600 MG 12 hr tablet Commonly known as: MUCINEX Take 600 mg by mouth in the morning and at bedtime.   insulin lispro 100 UNIT/ML injection Commonly known as: HUMALOG Inject 4-10 Units into the skin 3 (three) times daily before meals. Sliding Scale   ketotifen 0.025 % ophthalmic solution Commonly known as: ZADITOR Place 1 drop into both eyes 2 (two) times daily as needed (allergy/dry/irritated eyes.).   Lantus SoloStar 100 UNIT/ML Solostar Pen Generic drug: insulin glargine Inject 18 Units into the skin at bedtime.   lidocaine 5 % Commonly known as: LIDODERM Place 2 patches onto the skin daily. Remove & Discard patch within 12 hours or as directed by MD   losartan 25 MG tablet Commonly known as: COZAAR Take 12.5 mg by mouth daily.   Magnesium 200 MG Tabs Take 0.5 tablets (100 mg total) by mouth 2 (two) times daily.   midodrine 10 MG tablet Commonly known as: PROAMATINE Take 10 mg by mouth 3 (three) times a week. Pre dialysis   MIRCERA IJ Mircera    multivitamin Tabs tablet Take 1 tablet by mouth daily with breakfast.   nitroGLYCERIN 0.4 MG SL tablet Commonly known as: NITROSTAT Place 1 tablet (0.4 mg total) under the tongue every 5 (five) minutes x 3 doses as needed for chest pain.   oxyCODONE 5 MG immediate release tablet Commonly known as: Oxy IR/ROXICODONE Take 1 tablet (5 mg total) by mouth every 6 (six) hours as  needed for severe pain.   simethicone 80 MG chewable tablet Commonly known as: MYLICON Chew 633 mg by mouth daily as needed for flatulence (indigestion.).   vancomycin 750 MG/150ML Soln Commonly known as: VANCOREADY Inject 150 mLs (750 mg total) into the vein Every Tuesday,Thursday,and Saturday with dialysis for 3 days. Start taking on: January 27, 2021   VITAMIN D (CHOLECALCIFEROL) PO Take by mouth 3 (three) times a week. With dialysis        Follow-up Information     Belinda Bowen, MD Follow up in 1 week(s).   Specialty: Endocrinology Contact information: [place] [address] [phone]         Jabier Mutton, MD. Go to.   Specialty: Infectious Diseases Why: 8/19 @ 1130 with dr Donata Clay information: [address] [place] [address] [phone]                  Time coordinating discharge: 35 min  Signed:  Geradine Girt DO  [facility] 01/25/2021, 9:13 AM

## 2021-01-25 NOTE — Progress Notes (Signed)
[facility] Progress Note   Subjective:  Seen in room - eating breakfast. Back pain continues to improve. No CP or dyspnea. For possible d/c today.  Objective Vitals:   01/25/21 0400 01/25/21 0500 01/25/21 0727 01/25/21 0843  BP: (!) 156/72  (!) 157/62   Pulse: 95  93   Resp: 16  17   Temp: 98.2 F (36.8 C)  98.3 F (36.8 C)   TempSrc: Oral  Oral   SpO2: 100%  99% 93%  Weight:  68.9 kg    Height:       Physical Exam General: Well appearing, NAD. Room air Heart: RRR; 2/6 murmur Lungs: CTAB  Abdomen: soft Extremities: No LE edema Dialysis Access: R thigh AVG + bruit  Additional Objective Labs: Basic Metabolic Panel: Recent Labs  Lab 01/19/21 0046 01/20/21 0214 01/22/21 0020 01/22/21 0747  NA 129* 129* 128*  --   K 3.8 4.0 4.2  --   CL 88* 87* 88*  --   CO2 26 25 25   --   GLUCOSE 346* 310* 291*  --   BUN 53* 64* 43*  --   CREATININE 8.83* 10.62* 8.36*  --   CALCIUM 8.5* 8.4* 8.6*  --   PHOS  --   --   --  4.4   Liver Function Tests: Recent Labs  Lab 01/20/21 0214  AST 20  ALT 30  ALKPHOS 203*  BILITOT 1.1  PROT 5.6*  ALBUMIN 2.2*   CBC: Recent Labs  Lab 01/19/21 0046 01/20/21 0214 01/22/21 0020  WBC 8.7 14.0* 13.5*  HGB 8.9* 9.5* 9.0*  HCT 25.0* 26.5* 25.3*  MCV 81.4 80.1 80.3  PLT 88* 141* 234  Studies/Results: DG CHEST PORT 1 VIEW  Result Date: 01/23/2021 CLINICAL DATA:  Cough. EXAM: PORTABLE CHEST 1 VIEW COMPARISON:  Chest radiograph and CTA 01/17/2021 FINDINGS: The cardiac silhouette remains mildly enlarged. The lungs are mildly hypoinflated with mild linear opacity in the left greater than right lung bases. No overt pulmonary edema, sizable pleural effusion, or pneumothorax is identified. Prior cervical spine fusion is noted. IMPRESSION: Bibasilar atelectasis. Electronically Signed   By: Logan Bores M.D.   On: 01/23/2021 14:30    Medications:  vancomycin Stopped (01/24/21 1817)    aspirin EC  81 mg Oral Once per day on  Mon Wed Fri   calcitRIOL  1.5 mcg Oral Once per day on Tue Thu Sat   carvedilol  3.125 mg Oral BID WC   Chlorhexidine Gluconate Cloth  6 each Topical Daily   darbepoetin (ARANESP) injection - DIALYSIS  60 mcg Intravenous Q Thu-HD   docusate sodium  100 mg Oral BID   simvastatin  40 mg Oral QHS   And   ezetimibe  10 mg Oral QHS   ferric citrate  210 mg Oral TID WC   heparin injection (subcutaneous)  5,000 Units Subcutaneous Q8H   insulin aspart  0-9 Units Subcutaneous TID WC   insulin aspart  8 Units Subcutaneous TID WC   [START ON 01/26/2021] insulin glargine  25 Units Subcutaneous Daily   lidocaine  2 patch Transdermal Q24H   loratadine  10 mg Oral Daily   losartan  12.5 mg Oral Daily   midodrine  10 mg Oral Once per day on Tue Thu Sat   mometasone-formoterol  2 puff Inhalation BID    Dialysis Orders: [facility] TTS 4h 400/1.5A EDW 67 kg 2K/2Ca UFP 4 AVG Heparin 4000 Mircera 60 q 2wks Calcitriol 1.5 TIW  Assessment/Plan: 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 Per ID, plan for Vanc thru 8/4. Hypoxia - on 2L O2.  CXR with hypoinflation of the lungs and atelectasis, CT chest with 8 mm ground-glass opacity seen in the left lung apex, infection vs inflammation vs neoplasm. Improved today ESRD: Continue HD per usual TTS schedule - next 8/2, as outpatient. Hypertension/volume: BP remains high, B effusions on chest imaging. Continue to challenge dry weight, get standing weights if able, lower on d/c.  Anemia  - Hgb 9, continue Aranesp q Thurs. Metabolic bone disease -  Ca/Phos ok. Continue VDRA and binders. DMT2 Chronic systolic HF  Veneta Penton, PA-C 01/25/2021, 10:04 AM  [employer]

## 2021-01-25 NOTE — TOC Transition Note (Signed)
Transition of Care [facility]) - CM/SW Discharge Note   Patient Details  Name: Belinda [name] MRN: [id number] Date of Birth: Aug 25, 1950  [facility] CM/SW Contact:  Pollie Friar, RN Phone Number: 01/25/2021, 11:54 AM   Clinical Narrative:    Patient is discharging home with [facility] services through [place]. Jason with [facility] accepted the referral. Information on the AVS. Pt has all needed DME at home.  Pt has transportation home today.   Final next level of care: Home w Home Health Services Barriers to Discharge: No Barriers Identified   Patient Goals and CMS Choice   CMS Medicare.gov Compare Post Acute Care list provided to:: Patient Choice offered to / list presented to : Patient  Discharge Placement                       Discharge Plan and Services                          HH Arranged: PT [facility] Agency: [place] ([facility]) Date [place]: 01/25/21   Representative spoke with at [place]: [place] ([place]) Interventions     Readmission Risk Interventions No flowsheet data found.

## 2021-01-26 ENCOUNTER — Telehealth: Payer: Self-pay | Admitting: Nurse Practitioner

## 2021-01-26 NOTE — Telephone Encounter (Signed)
Transition of care contact from inpatient facility  Date of discharge: 01/25/2021 Date of contact: 01/26/2021 Method: Phone Spoke to: Patient  Patient contacted to discuss transition of care from recent inpatient hospitalization. Patient was admitted to [facility] from 07/23-07/31/2022 with discharge diagnosis of Sepsis present on admission secondary to granulicatella adiacens. On ABX at HD center through 01/29/2021  Medication changes were reviewed.  Patient will follow up with his/her outpatient HD unit on: 01/27/2021

## 2021-01-28 ENCOUNTER — Ambulatory Visit: Payer: Medicare Other

## 2021-02-05 LAB — MISC LABCORP TEST (SEND OUT): Labcorp test code: 182808

## 2021-02-10 ENCOUNTER — Emergency Department (HOSPITAL_COMMUNITY): Payer: Medicare Other

## 2021-02-10 ENCOUNTER — Other Ambulatory Visit: Payer: Self-pay

## 2021-02-10 ENCOUNTER — Encounter (HOSPITAL_COMMUNITY): Payer: Self-pay

## 2021-02-10 ENCOUNTER — Inpatient Hospital Stay (HOSPITAL_COMMUNITY): Payer: Medicare Other

## 2021-02-10 ENCOUNTER — Inpatient Hospital Stay (HOSPITAL_COMMUNITY)
Admission: EM | Admit: 2021-02-10 | Discharge: 2021-02-12 | DRG: 640 | Disposition: A | Discharge status: Home Health | Payer: Medicare Other | Attending: Internal Medicine | Admitting: Internal Medicine

## 2021-02-10 DIAGNOSIS — Z20822 Contact with and (suspected) exposure to covid-19: Secondary | ICD-10-CM | POA: Diagnosis present

## 2021-02-10 DIAGNOSIS — E1121 Type 2 diabetes mellitus with diabetic nephropathy: Secondary | ICD-10-CM

## 2021-02-10 DIAGNOSIS — Z981 Arthrodesis status: Secondary | ICD-10-CM | POA: Diagnosis not present

## 2021-02-10 DIAGNOSIS — E8770 Fluid overload, unspecified: Secondary | ICD-10-CM | POA: Diagnosis not present

## 2021-02-10 DIAGNOSIS — J45909 Unspecified asthma, uncomplicated: Secondary | ICD-10-CM | POA: Diagnosis present

## 2021-02-10 DIAGNOSIS — I132 Hypertensive heart and chronic kidney disease with heart failure and with stage 5 chronic kidney disease, or end stage renal disease: Secondary | ICD-10-CM | POA: Diagnosis present

## 2021-02-10 DIAGNOSIS — Z96652 Presence of left artificial knee joint: Secondary | ICD-10-CM | POA: Diagnosis present

## 2021-02-10 DIAGNOSIS — M549 Dorsalgia, unspecified: Secondary | ICD-10-CM | POA: Diagnosis present

## 2021-02-10 DIAGNOSIS — E1122 Type 2 diabetes mellitus with diabetic chronic kidney disease: Secondary | ICD-10-CM | POA: Diagnosis present

## 2021-02-10 DIAGNOSIS — Z888 Allergy status to other drugs, medicaments and biological substances status: Secondary | ICD-10-CM | POA: Diagnosis not present

## 2021-02-10 DIAGNOSIS — D631 Anemia in chronic kidney disease: Secondary | ICD-10-CM | POA: Diagnosis present

## 2021-02-10 DIAGNOSIS — E785 Hyperlipidemia, unspecified: Secondary | ICD-10-CM | POA: Diagnosis present

## 2021-02-10 DIAGNOSIS — Z992 Dependence on renal dialysis: Secondary | ICD-10-CM

## 2021-02-10 DIAGNOSIS — Z880 Allergy status to penicillin: Secondary | ICD-10-CM

## 2021-02-10 DIAGNOSIS — N186 End stage renal disease: Secondary | ICD-10-CM

## 2021-02-10 DIAGNOSIS — N2581 Secondary hyperparathyroidism of renal origin: Secondary | ICD-10-CM | POA: Diagnosis present

## 2021-02-10 DIAGNOSIS — Z8249 Family history of ischemic heart disease and other diseases of the circulatory system: Secondary | ICD-10-CM

## 2021-02-10 DIAGNOSIS — N185 Chronic kidney disease, stage 5: Secondary | ICD-10-CM

## 2021-02-10 DIAGNOSIS — K219 Gastro-esophageal reflux disease without esophagitis: Secondary | ICD-10-CM | POA: Diagnosis present

## 2021-02-10 DIAGNOSIS — Z79899 Other long term (current) drug therapy: Secondary | ICD-10-CM

## 2021-02-10 DIAGNOSIS — M898X9 Other specified disorders of bone, unspecified site: Secondary | ICD-10-CM | POA: Diagnosis present

## 2021-02-10 DIAGNOSIS — Z881 Allergy status to other antibiotic agents status: Secondary | ICD-10-CM

## 2021-02-10 DIAGNOSIS — R112 Nausea with vomiting, unspecified: Secondary | ICD-10-CM

## 2021-02-10 DIAGNOSIS — E1165 Type 2 diabetes mellitus with hyperglycemia: Secondary | ICD-10-CM | POA: Diagnosis present

## 2021-02-10 DIAGNOSIS — G8929 Other chronic pain: Secondary | ICD-10-CM | POA: Diagnosis present

## 2021-02-10 DIAGNOSIS — I5030 Unspecified diastolic (congestive) heart failure: Secondary | ICD-10-CM

## 2021-02-10 DIAGNOSIS — R1084 Generalized abdominal pain: Secondary | ICD-10-CM | POA: Diagnosis not present

## 2021-02-10 DIAGNOSIS — Z886 Allergy status to analgesic agent status: Secondary | ICD-10-CM | POA: Diagnosis not present

## 2021-02-10 DIAGNOSIS — I5032 Chronic diastolic (congestive) heart failure: Secondary | ICD-10-CM | POA: Diagnosis present

## 2021-02-10 DIAGNOSIS — Z7951 Long term (current) use of inhaled steroids: Secondary | ICD-10-CM

## 2021-02-10 DIAGNOSIS — I10 Essential (primary) hypertension: Secondary | ICD-10-CM | POA: Diagnosis not present

## 2021-02-10 DIAGNOSIS — I2721 Secondary pulmonary arterial hypertension: Secondary | ICD-10-CM | POA: Diagnosis present

## 2021-02-10 DIAGNOSIS — E1129 Type 2 diabetes mellitus with other diabetic kidney complication: Secondary | ICD-10-CM | POA: Diagnosis present

## 2021-02-10 DIAGNOSIS — Z794 Long term (current) use of insulin: Secondary | ICD-10-CM

## 2021-02-10 DIAGNOSIS — J9601 Acute respiratory failure with hypoxia: Secondary | ICD-10-CM | POA: Diagnosis present

## 2021-02-10 DIAGNOSIS — D649 Anemia, unspecified: Secondary | ICD-10-CM | POA: Diagnosis present

## 2021-02-10 DIAGNOSIS — F419 Anxiety disorder, unspecified: Secondary | ICD-10-CM | POA: Diagnosis present

## 2021-02-10 DIAGNOSIS — Z7982 Long term (current) use of aspirin: Secondary | ICD-10-CM

## 2021-02-10 DIAGNOSIS — I509 Heart failure, unspecified: Secondary | ICD-10-CM

## 2021-02-10 DIAGNOSIS — I248 Other forms of acute ischemic heart disease: Secondary | ICD-10-CM | POA: Diagnosis present

## 2021-02-10 DIAGNOSIS — R7401 Elevation of levels of liver transaminase levels: Secondary | ICD-10-CM | POA: Diagnosis present

## 2021-02-10 LAB — BRAIN NATRIURETIC PEPTIDE: B Natriuretic Peptide: 3044.6 pg/mL — ABNORMAL HIGH (ref 0.0–100.0)

## 2021-02-10 LAB — TROPONIN I (HIGH SENSITIVITY)
Troponin I (High Sensitivity): 33 ng/L — ABNORMAL HIGH (ref <18)
Troponin I (High Sensitivity): 36 ng/L — ABNORMAL HIGH (ref <18)

## 2021-02-10 LAB — CBC WITH DIFFERENTIAL/PLATELET
Abs Immature Granulocytes: 0.02 10*3/uL (ref 0.00–0.07)
Basophils Absolute: 0.1 10*3/uL (ref 0.0–0.1)
Basophils Relative: 1 %
Eosinophils Absolute: 0.1 10*3/uL (ref 0.0–0.5)
Eosinophils Relative: 2 %
HCT: 32.9 % — ABNORMAL LOW (ref 36.0–46.0)
Hemoglobin: 10.3 g/dL — ABNORMAL LOW (ref 12.0–15.0)
Immature Granulocytes: 0 %
Lymphocytes Relative: 15 %
Lymphs Abs: 1.1 10*3/uL (ref 0.7–4.0)
MCH: 27.2 pg (ref 26.0–34.0)
MCHC: 31.3 g/dL (ref 30.0–36.0)
MCV: 86.8 fL (ref 80.0–100.0)
Monocytes Absolute: 0.6 10*3/uL (ref 0.1–1.0)
Monocytes Relative: 9 %
Neutro Abs: 5.4 10*3/uL (ref 1.7–7.7)
Neutrophils Relative %: 73 %
Platelets: 245 10*3/uL (ref 150–400)
RBC: 3.79 MIL/uL — ABNORMAL LOW (ref 3.87–5.11)
RDW: 20.7 % — ABNORMAL HIGH (ref 11.5–15.5)
WBC: 7.3 10*3/uL (ref 4.0–10.5)
nRBC: 0 % (ref 0.0–0.2)

## 2021-02-10 LAB — LACTIC ACID, PLASMA
Lactic Acid, Venous: 2.4 mmol/L (ref 0.5–1.9)
Lactic Acid, Venous: 1.7 mmol/L (ref 0.5–1.9)

## 2021-02-10 LAB — RESP PANEL BY RT-PCR (FLU A&B, COVID) ARPGX2
Influenza A by PCR: NEGATIVE
Influenza B by PCR: NEGATIVE
SARS Coronavirus 2 by RT PCR: NEGATIVE

## 2021-02-10 LAB — COMPREHENSIVE METABOLIC PANEL
ALT: 15 U/L (ref 0–44)
AST: 20 U/L (ref 15–41)
Albumin: 3.6 g/dL (ref 3.5–5.0)
Alkaline Phosphatase: 173 U/L — ABNORMAL HIGH (ref 38–126)
Anion gap: 21 — ABNORMAL HIGH (ref 5–15)
BUN: 37 mg/dL — ABNORMAL HIGH (ref 8–23)
CO2: 23 mmol/L (ref 22–32)
Calcium: 9.7 mg/dL (ref 8.9–10.3)
Chloride: 93 mmol/L — ABNORMAL LOW (ref 98–111)
Creatinine, Ser: 7.34 mg/dL — ABNORMAL HIGH (ref 0.44–1.00)
GFR, Estimated: 6 mL/min — ABNORMAL LOW (ref >60)
Glucose, Bld: 199 mg/dL — ABNORMAL HIGH (ref 70–99)
Potassium: 4.7 mmol/L (ref 3.5–5.1)
Sodium: 137 mmol/L (ref 135–145)
Total Bilirubin: 1.7 mg/dL — ABNORMAL HIGH (ref 0.3–1.2)
Total Protein: 7 g/dL (ref 6.5–8.1)

## 2021-02-10 MED ORDER — AZELASTINE HCL 0.1 % NA SOLN
2.0000 | Freq: Every day | NASAL | Status: DC
  Administered 2021-02-11 – 2021-02-12 (×2): 2 via NASAL
  Filled 2021-02-10: qty 30

## 2021-02-10 MED ORDER — DICLOFENAC SODIUM 1 % EX GEL: 1.0000 "application " | Freq: Four times a day (QID) | CUTANEOUS | Status: DC | PRN

## 2021-02-10 MED ORDER — GUAIFENESIN ER 600 MG PO TB12
600.0000 mg | ORAL_TABLET | Freq: Two times a day (BID) | ORAL | Status: DC
  Administered 2021-02-11 – 2021-02-12 (×3): 600 mg via ORAL
  Filled 2021-02-10 (×3): qty 1

## 2021-02-10 MED ORDER — ACETAMINOPHEN 325 MG PO TABS
650.0000 mg | ORAL_TABLET | Freq: Four times a day (QID) | ORAL | Status: DC | PRN
  Administered 2021-02-11 – 2021-02-12 (×2): 650 mg via ORAL
  Filled 2021-02-10 (×2): qty 2

## 2021-02-10 MED ORDER — ALBUTEROL SULFATE (2.5 MG/3ML) 0.083% IN NEBU: 2.5000 mg | INHALATION_SOLUTION | Freq: Four times a day (QID) | RESPIRATORY_TRACT | Status: DC | PRN

## 2021-02-10 MED ORDER — EZETIMIBE-SIMVASTATIN 10-40 MG PO TABS
1.0000 | ORAL_TABLET | Freq: Every day | ORAL | Status: DC
  Filled 2021-02-10: qty 1

## 2021-02-10 MED ORDER — ALPRAZOLAM 0.25 MG PO TABS: 0.2500 mg | ORAL_TABLET | Freq: Two times a day (BID) | ORAL | Status: DC | PRN

## 2021-02-10 MED ORDER — ARFORMOTEROL TARTRATE 15 MCG/2ML IN NEBU
15.0000 ug | INHALATION_SOLUTION | Freq: Two times a day (BID) | RESPIRATORY_TRACT | Status: DC
  Administered 2021-02-11 – 2021-02-12 (×3): 15 ug via RESPIRATORY_TRACT
  Filled 2021-02-10 (×5): qty 2

## 2021-02-10 MED ORDER — MAGNESIUM OXIDE -MG SUPPLEMENT 400 (240 MG) MG PO TABS
200.0000 mg | ORAL_TABLET | Freq: Two times a day (BID) | ORAL | Status: DC
  Administered 2021-02-11 – 2021-02-12 (×3): 200 mg via ORAL
  Filled 2021-02-10 (×3): qty 1

## 2021-02-10 MED ORDER — ASPIRIN EC 81 MG PO TBEC
81.0000 mg | DELAYED_RELEASE_TABLET | ORAL | Status: DC
  Administered 2021-02-11: 81 mg via ORAL
  Filled 2021-02-10 (×2): qty 1

## 2021-02-10 MED ORDER — IOHEXOL 350 MG/ML SOLN
60.0000 mL | Freq: Once | INTRAVENOUS | Status: AC | PRN
  Administered 2021-02-10: 60 mL via INTRAVENOUS

## 2021-02-10 MED ORDER — METOCLOPRAMIDE HCL 5 MG/ML IJ SOLN
5.0000 mg | Freq: Three times a day (TID) | INTRAMUSCULAR | Status: DC
  Administered 2021-02-11 – 2021-02-12 (×5): 5 mg via INTRAVENOUS
  Filled 2021-02-10 (×5): qty 2

## 2021-02-10 MED ORDER — RENA-VITE PO TABS
1.0000 | ORAL_TABLET | Freq: Every day | ORAL | Status: DC
  Administered 2021-02-11 – 2021-02-12 (×2): 1 via ORAL
  Filled 2021-02-10 (×2): qty 1

## 2021-02-10 MED ORDER — FAMOTIDINE 20 MG PO TABS
20.0000 mg | ORAL_TABLET | Freq: Every day | ORAL | Status: DC
  Administered 2021-02-11 – 2021-02-12 (×2): 20 mg via ORAL
  Filled 2021-02-10 (×2): qty 1

## 2021-02-10 MED ORDER — SIMVASTATIN 20 MG PO TABS
40.0000 mg | ORAL_TABLET | Freq: Every day | ORAL | Status: DC
  Administered 2021-02-11: 40 mg via ORAL
  Filled 2021-02-10: qty 2

## 2021-02-10 MED ORDER — SODIUM CHLORIDE 0.9% FLUSH
3.0000 mL | Freq: Two times a day (BID) | INTRAVENOUS | Status: DC
  Administered 2021-02-11 – 2021-02-12 (×4): 3 mL via INTRAVENOUS

## 2021-02-10 MED ORDER — CHLORHEXIDINE GLUCONATE CLOTH 2 % EX PADS
6.0000 | MEDICATED_PAD | Freq: Every day | CUTANEOUS | Status: DC
  Administered 2021-02-11 – 2021-02-12 (×2): 6 via TOPICAL

## 2021-02-10 MED ORDER — FAMOTIDINE IN NACL 20-0.9 MG/50ML-% IV SOLN
20.0000 mg | Freq: Once | INTRAVENOUS | Status: AC
  Administered 2021-02-10: 20 mg via INTRAVENOUS
  Filled 2021-02-10: qty 50

## 2021-02-10 MED ORDER — MIDODRINE HCL 5 MG PO TABS
10.0000 mg | ORAL_TABLET | ORAL | Status: DC
  Filled 2021-02-10: qty 2

## 2021-02-10 MED ORDER — LORATADINE 10 MG PO TABS
10.0000 mg | ORAL_TABLET | Freq: Every day | ORAL | Status: DC
  Administered 2021-02-11 – 2021-02-12 (×2): 10 mg via ORAL
  Filled 2021-02-10 (×2): qty 1

## 2021-02-10 MED ORDER — HEPARIN SODIUM (PORCINE) 5000 UNIT/ML IJ SOLN
5000.0000 [IU] | Freq: Three times a day (TID) | INTRAMUSCULAR | Status: DC
  Administered 2021-02-10 – 2021-02-12 (×5): 5000 [IU] via SUBCUTANEOUS
  Filled 2021-02-10 (×5): qty 1

## 2021-02-10 MED ORDER — EZETIMIBE 10 MG PO TABS
10.0000 mg | ORAL_TABLET | Freq: Every day | ORAL | Status: DC
  Administered 2021-02-11: 10 mg via ORAL
  Filled 2021-02-10 (×2): qty 1

## 2021-02-10 MED ORDER — OLOPATADINE HCL 0.1 % OP SOLN: 1.0000 [drp] | Freq: Every day | OPHTHALMIC | Status: DC | PRN

## 2021-02-10 MED ORDER — LOSARTAN POTASSIUM 25 MG PO TABS
12.5000 mg | ORAL_TABLET | Freq: Every day | ORAL | Status: DC
  Administered 2021-02-11: 12.5 mg via ORAL
  Filled 2021-02-10: qty 1
  Filled 2021-02-10: qty 0.5
  Filled 2021-02-10: qty 1

## 2021-02-10 MED ORDER — ACETAMINOPHEN 650 MG RE SUPP: 650.0000 mg | Freq: Four times a day (QID) | RECTAL | Status: DC | PRN

## 2021-02-10 MED ORDER — BUDESONIDE 0.5 MG/2ML IN SUSP
0.5000 mg | Freq: Two times a day (BID) | RESPIRATORY_TRACT | Status: DC
Start: 2021-02-10 — End: 2021-02-13
  Administered 2021-02-11 – 2021-02-12 (×3): 0.5 mg via RESPIRATORY_TRACT
  Filled 2021-02-10 (×5): qty 2

## 2021-02-10 MED ORDER — SIMETHICONE 80 MG PO CHEW: 160.0000 mg | CHEWABLE_TABLET | Freq: Every day | ORAL | Status: DC | PRN

## 2021-02-10 NOTE — ED Notes (Signed)
Back from CT, alert, NAD, calm, interactiove, reports "feel about the same".

## 2021-02-10 NOTE — ED Notes (Signed)
Report given to dialysis RN

## 2021-02-10 NOTE — ED Notes (Signed)
Dinner tray provided to pt at this time.

## 2021-02-10 NOTE — ED Triage Notes (Signed)
Pt from home with ems, discharged from hospital on 7/31 for infection. Pt reports over the last few days increased SOB. Dialysis T/TH/Sat, did not go today. Hx of CHF.  96% on room air 2L [place] applied for comfort with some improvement.

## 2021-02-10 NOTE — ED Provider Notes (Signed)
[facility] EMERGENCY DEPARTMENT Provider Note   CSN: [id number] Arrival date & time: 02/10/21  1007     History No chief complaint on file.   Belinda [name] is a 70 y.o. female.  70y/o female with hx of CHF, ESRD on hemodialysis,  GERD, history of unspecified MRSA infection, hyperlipidemia, hypertension, and recent bacteremia at the end of July who just finished vancomycin with dialysis on 01/30/21 who is presenting today from home with sx of SOB, abd discomfort and vomiting.  Patient states the symptoms have started over the last 2 to 3 days.  She started noticing worsening shortness of breath with any exertion but no cough or chest pain.  She then started having queasiness in her abdomen and it feeling upset.  She had several episodes of vomiting yesterday and then also today with worsening shortness of breath.  She denies any diarrhea or fevers that she is aware of.  She last dialyzed on Saturday and had a full course and left at her dry weight.  However she did not feel like she could make it to dialysis today due to the way she felt.  She has not changed any medications and currently even on oxygen is still feeling short of breath.  She denies any back or neck pain.  EMS reported oxygen saturation within normal limits but patient was tachycardic and tachypneic and was placed on 2 L.   The history is provided by the patient and medical records.      Past Medical History:  Diagnosis Date   Acute CHF (congestive heart failure) ([place]) 02/02/2018   Anemia    "get monthly injections to boost my HgB" (02/02/2018)   Anxiety    Asthma    Back pain    intractable low back   Chronic neck pain    CKD (chronic kidney disease), stage III (HCC)    on dialysis T/Th/Sa Jeneen Rinks   Depression    Dysrhythmia    palpitations or heart racing periodic. has had for years- Dr Mare Ferrari follows.   GERD (gastroesophageal reflux disease)    History of blood transfusion 2016   After  knee replacemnet   Hx MRSA infection    Hyperlipidemia    Hypertension    Migraines    "~ 2/month" (02/02/2018), gets headaches with dialysis   Nerve damage    right hand   Osteoarthritis    "knees, neck" (02/02/2018)   Palpitations    Pneumonia    "twice in 2018" (02/02/2018)   Pulmonary artery hypertension (HCC)    Seasonal allergies    Type II diabetes mellitus (HCC)    insulin dependent - fasting 140-200     Patient Active Problem List   Diagnosis Date Noted   Bacteremia 01/20/2021   Sepsis due to undetermined organism ([place]) 01/18/2021   ESRD on hemodialysis ([place]) 01/18/2021   Anemia due to end stage renal disease ([place]) 01/18/2021   Abnormal liver function tests 01/18/2021   Hypoxia 01/18/2021   Renal failure (ARF), acute on chronic (HCC)    Hyperglycemia    Pressure injury of skin 02/04/2018   Acute CHF (congestive heart failure) ([place]) 02/02/2018   DM (diabetes mellitus), type 2 with renal complications ([place]) 64/68/0321   Bilateral lower extremity edema 22/48/2500   Chronic systolic (congestive) heart failure ([place]) 37/09/8887   Acute systolic (congestive) heart failure ([place]) 05/30/2017   Essential hypertension 05/30/2017   Congestive heart failure ([place]) 04/26/2017   Chronic kidney disease 04/26/2017  DJD (degenerative joint disease) of knee 02/12/2015   Frequent PVCs 11/12/2014   Cervical spondylosis with myelopathy and radiculopathy 01/02/2014   Cervical spondylosis with myelopathy 01/01/2014   Wound check, abscess 06/14/2013   Cellulitis and abscess of buttock - right 06/12/2013   Dyspnea on exertion 03/22/2011   Chest pain 03/22/2011   Diabetes mellitus    Back pain    Palpitations    Anemia    Hx MRSA infection    Osteoarthritis     Past Surgical History:  Procedure Laterality Date   ABDOMINAL HYSTERECTOMY  03/2001   Abdominal supracervical hysterectomy, left salpingo-oophorectomy   ANTERIOR CERVICAL DECOMP/DISCECTOMY FUSION N/A 01/01/2014   Procedure:  CERVICAL THREE-FOUR,CERVICAL FIVE-SIX,CERVICAL SIX-SEVEN ANTERIOR CERVICAL DECOMPRESSION/DISCECTOMY/FUSION;  Surgeon: Kristeen Miss, MD;  Location: [facility];  Service: Neurosurgery;  Laterality: N/A;  left-side approach   AV FISTULA PLACEMENT Right 02/13/2020   Procedure: ARTERIOVENOUS (AV) FISTULA CREATION;  Surgeon: Waynetta Sandy, MD;  Location: [facility];  Service: Vascular;  Laterality: Right;   AV FISTULA PLACEMENT Right 08/11/2020   Procedure: INSERTION OF RIGHT THIGH ARTERIOVENOUS (AV) GORE-TEX GRAFT;  Surgeon: Angelia Mould, MD;  Location: [facility];  Service: Vascular;  Laterality: Right;   [place] Right 04/16/2020   Procedure: BASCILIC VEIN TRANSPOSITION SECOND STAGE RIGHT;  Surgeon: Waynetta Sandy, MD;  Location: [facility];  Service: Vascular;  Laterality: Right;   CATARACT EXTRACTION W/ INTRAOCULAR LENS  IMPLANT, BILATERAL Bilateral 2012   CESAREAN SECTION     COLONOSCOPY W/ POLYPECTOMY     DILATION AND CURETTAGE OF UTERUS  X 2   ESOPHAGOGASTRODUODENOSCOPY     HAND SURGERY Left 1970s   laceration repair 4th and 5th digits   KNEE ARTHROSCOPY Left    torn mensicus   LIGATION OF COMPETING BRANCHES OF ARTERIOVENOUS FISTULA Right 05/07/2020   Procedure: LIGATION OF RIGHT UPPER EXTREMITY FISTULA;  Surgeon: Waynetta Sandy, MD;  Location: [facility];  Service: Vascular;  Laterality: Right;   NASAL SINUS SURGERY     RIGHT HEART CATH N/A 06/23/2018   Procedure: RIGHT HEART CATH;  Surgeon: Jolaine Artist, MD;  Location: [facility];  Service: Cardiovascular;  Laterality: N/A;   TEE WITHOUT CARDIOVERSION N/A 01/22/2021   Procedure: TRANSESOPHAGEAL ECHOCARDIOGRAM (TEE);  Surgeon: Skeet Latch, MD;  Location: [facility];  Service: Cardiovascular;  Laterality: N/A;   TOTAL KNEE ARTHROPLASTY Left 02/12/2015   Procedure: TOTAL KNEE ARTHROPLASTY;  Surgeon: [name], MD;  Location: [facility];  Service: Orthopedics;   Laterality: Left;     OB History   No obstetric history on file.     Family History  Problem Relation Age of Onset   Heart disease Mother    Heart failure Father    Colon cancer Neg Hx    Stomach cancer Neg Hx    Esophageal cancer Neg Hx     Social History   Tobacco Use   Smoking status: Never   Smokeless tobacco: Never  Vaping Use   Vaping Use: Never used  Substance Use Topics   Alcohol use: Never   Drug use: Never    Home Medications Prior to Admission medications   Medication Sig Start Date End Date Taking? Authorizing Provider  acetaminophen (TYLENOL) 650 MG CR tablet Take 650-1,300 mg by mouth every 8 (eight) hours as needed for pain (pain).     [provider]  Alpha-D-Galactosidase (BEANO PO) Take 1-2 tablets by mouth daily  as needed (gas). With certain foods.    [provider]  ALPRAZolam Duanne Moron) 0.5 MG tablet Take 0.25-0.5 mg by mouth 2 (two) times daily as needed for anxiety. 12/13/20   [provider]  aspirin EC 81 MG tablet Take 81 mg by mouth 3 (three) times a week. Swallow whole.    [provider]  Azelastine HCl 0.15 % SOLN Place 2 sprays into both nostrils daily.    [provider]  budesonide-formoterol (SYMBICORT) 160-4.5 MCG/ACT inhaler Inhale 2 puffs into the lungs 2 (two) times daily as needed (cough/respiratory issues.).     [provider]  diclofenac sodium (VOLTAREN) 1 % GEL Apply 1 application topically 4 (four) times daily as needed (neck pain).     [provider]  docusate sodium (COLACE) 100 MG capsule Take 1 capsule (100 mg total) by mouth 2 (two) times daily. 01/25/21   Geradine Girt, DO  ezetimibe-simvastatin (VYTORIN) 10-40 MG per tablet Take 1 tablet by mouth at bedtime.     [provider]  ferric citrate (AURYXIA) 1 GM 210 MG(Fe) tablet Take 210-420 mg by mouth See admin instructions. Take 2 tablets (420 mg) by mouth 3 times daily with meals & take 1 tablet (210 mg)  by mouth with each snack 11/01/19   [provider]  fexofenadine (ALLEGRA) 180 MG tablet Take 180 mg by mouth daily.    [provider]  guaiFENesin (MUCINEX) 600 MG 12 hr tablet Take 600 mg by mouth in the morning and at bedtime.     [provider]  insulin lispro (HUMALOG) 100 UNIT/ML injection Inject 4-10 Units into the skin 3 (three) times daily before meals. Sliding Scale    [provider]  ketotifen (ZADITOR) 0.025 % ophthalmic solution Place 1 drop into both eyes 2 (two) times daily as needed (allergy/dry/irritated eyes.).    [provider]  LANTUS SOLOSTAR 100 UNIT/ML Solostar Pen Inject 18 Units into the skin at bedtime.  04/19/18   [provider]  lidocaine (LIDODERM) 5 % Place 2 patches onto the skin daily. Remove & Discard patch within 12 hours or as directed by MD 01/25/21   Geradine Girt, DO  losartan (COZAAR) 25 MG tablet Take 12.5 mg by mouth daily. 07/11/20   [provider]  Magnesium 200 MG TABS Take 0.5 tablets (100 mg total) by mouth 2 (two) times daily.    [provider]  Methoxy PEG-Epoetin Beta (MIRCERA IJ) Mircera 02/14/20 02/12/21  [provider]  midodrine (PROAMATINE) 10 MG tablet Take 10 mg by mouth 3 (three) times a week. Pre dialysis    [provider]  multivitamin (RENA-VIT) TABS tablet Take 1 tablet by mouth daily with breakfast.  11/01/19   [provider]  nitroGLYCERIN (NITROSTAT) 0.4 MG SL tablet Place 1 tablet (0.4 mg total) under the tongue every 5 (five) minutes x 3 doses as needed for chest pain. 03/14/20   Bensimhon, Shaune Pascal, MD  oxyCODONE (OXY IR/ROXICODONE) 5 MG immediate release tablet Take 1 tablet (5 mg total) by mouth every 6 (six) hours as needed for severe pain. 01/25/21   Geradine Girt, DO  simethicone (MYLICON) 80 MG chewable tablet Chew 160 mg by mouth daily as needed for flatulence (indigestion.).     [provider]  VITAMIN D,  CHOLECALCIFEROL, PO Take by mouth 3 (three) times a week. With dialysis    [provider]    Allergies    Doxycycline, [name] [  sacubitril-valsartan], Metformin, Omnicef [cefdinir], Aspirin, Augmentin [amoxicillin-pot clavulanate], Erythromycin, Gabapentin, and Nickel  Review of Systems   Review of Systems  All other systems reviewed and are negative.  Physical Exam Updated Vital Signs BP (!) 166/90 (BP Location: Left Arm)   Pulse (!) 110   Temp 97.8 F (36.6 C) (Oral)   Resp (!) 22   SpO2 97%   Physical Exam Vitals and nursing note reviewed.  Constitutional:      General: She is not in acute distress.    Appearance: Normal appearance. She is well-developed.  HENT:     Head: Normocephalic and atraumatic.     Nose: Nose normal.     Mouth/Throat:     Mouth: Mucous membranes are moist.  Eyes:     Pupils: Pupils are equal, round, and reactive to light.  Cardiovascular:     Rate and Rhythm: Regular rhythm. Tachycardia present.     Heart sounds: Normal heart sounds. No murmur heard.   No friction rub.  Pulmonary:     Effort: Pulmonary effort is normal.     Breath sounds: Examination of the right-middle field reveals rales. Examination of the right-lower field reveals rales. Examination of the left-lower field reveals rales. Rales present. No wheezing.  Abdominal:     General: Bowel sounds are normal. There is no distension.     Palpations: Abdomen is soft.     Tenderness: There is no abdominal tenderness. There is no guarding or rebound.  Musculoskeletal:        General: No tenderness. Normal range of motion.     Cervical back: Normal range of motion and neck supple.     Right lower leg: No edema.     Left lower leg: No edema.     Comments: No edema  Skin:    General: Skin is warm and dry.     Findings: No rash.  Neurological:     Mental Status: She is alert and oriented to person, place, and time. Mental status is at baseline.     Cranial Nerves: No cranial  nerve deficit.  Psychiatric:        Mood and Affect: Mood normal.        Behavior: Behavior normal.    ED Results / Procedures / Treatments   Labs (all labs ordered are listed, but only abnormal results are displayed) Labs Reviewed  CBC WITH DIFFERENTIAL/PLATELET - Abnormal; Notable for the following components:      Result Value   RBC 3.79 (*)    Hemoglobin 10.3 (*)    HCT 32.9 (*)    RDW 20.7 (*)    All other components within normal limits  BRAIN NATRIURETIC PEPTIDE - Abnormal; Notable for the following components:   B Natriuretic Peptide 3,044.6 (*)    All other components within normal limits  COMPREHENSIVE METABOLIC PANEL - Abnormal; Notable for the following components:   Chloride 93 (*)    Glucose, Bld 199 (*)    BUN 37 (*)    Creatinine, Ser 7.34 (*)    Alkaline Phosphatase 173 (*)    Total Bilirubin 1.7 (*)    GFR, Estimated 6 (*)    Anion gap 21 (*)    All other components within normal limits  LACTIC ACID, PLASMA - Abnormal; Notable for the following components:   Lactic Acid, Venous 2.4 (*)    All other components within normal limits  TROPONIN I (HIGH SENSITIVITY) - Abnormal; Notable for the following components:   Troponin  I (High Sensitivity) 33 (*)    All other components within normal limits  TROPONIN I (HIGH SENSITIVITY) - Abnormal; Notable for the following components:   Troponin I (High Sensitivity) 36 (*)    All other components within normal limits  LACTIC ACID, PLASMA    EKG EKG Interpretation  Date/Time:  Tuesday February 10 2021 10:29:58 EDT Ventricular Rate:  111 PR Interval:  176 QRS Duration: 90 QT Interval:  352 QTC Calculation: 478 R Axis:   -69 Text Interpretation: Sinus tachycardia Left axis deviation Anterior infarct , age undetermined No significant change since last tracing Confirmed by Blanchie Dessert [phone]) on 02/10/2021 12:18:40 PM  Radiology DG Chest 2 View  Result Date: 02/10/2021 CLINICAL DATA:  Shortness of breath  EXAM: CHEST - 2 VIEW COMPARISON:  01/23/2021 FINDINGS: Cardiomegaly. Mild aortic tortuosity. There is no edema, consolidation, effusion, or pneumothorax. IMPRESSION: No acute finding. Chronic cardiomegaly. Electronically Signed   By: Monte Fantasia M.D.   On: 02/10/2021 11:22    Procedures Procedures   Medications Ordered in ED Medications - No data to display  ED Course  I have reviewed the triage vital signs and the nursing notes.  Pertinent labs & imaging results that were available during my care of the patient were reviewed by me and considered in my medical decision making (see chart for details).    MDM Rules/Calculators/A&P                           Patient is a 70 year old female with multiple medical problems presenting today with shortness of breath, abdominal uneasiness, vomiting and just not feeling well.  Last dialysis session was Saturday where she received a full course but could not make it today.  It is tachypneic and has some rales on exam but is able to speak in complete sentences on 2 L but satting 99%.  She is persistently tachycardic but blood pressure is slightly elevated.  She does not have distal signs of edema but concern for CHF versus worsening pericardial effusion versus anemia versus PE vs CAP.  Patient recently completed a course of vancomycin due to bacteremia but had no focal source.  She had an ultrasound at that time that did show a small pericardial effusion but no evidence consistent with endocarditis.  She has no focal back pain concerning for osteomyelitis at this time.  She does complain of abdominal discomfort and intermittent vomiting over the last few days but her abdomen is soft and there is no reproducible tenderness.  EKG with no acute changes other than sinus tachycardia and chest x-ray with chronic cardiomegaly but no evidence of infection.  Remainder of labs are pending.  If there is no explainable cause for patient's symptoms in current orders may  need to do a repeat CTA to rule out PE.  She did have 1 3 weeks ago but she was hospitalized during that time and would have increased risk given shortness of breath and tachycardia.  3:14 PM Patient's troponins are at baseline in the 30s, repeat lactate is improved at 1.7 without intervention, CBC with stable hemoglobin of 10 and normal white count, BNP is significantly elevated today at 3000 and CMP with stable sodium and potassium and labs consistent with end-stage renal disease.  CTA is negative for PE does show cardiomegaly and trace effusions.  On repeat evaluation patient has no abdominal pain but is still feeling short of breath and describes more of a CHF  pattern that is worse with exertion.  It is improved with rest and oxygen.  Also complaining of some orthopnea.  Will discuss with nephrology as feel that patient will need dialysis but given her ongoing symptoms she does not have oxygen at home and does not think she will be safe going home today.  Will discuss for admission.  At this time she is not wheezing.  Stable at rest.  MDM   Amount and/or Complexity of Data Reviewed Clinical lab tests: ordered and reviewed Tests in the radiology section of CPT: ordered and reviewed Tests in the medicine section of CPT: ordered and reviewed Independent visualization of images, tracings, or specimens: yes  Risk of Complications, Morbidity, and/or Mortality Presenting problems: moderate Diagnostic procedures: moderate Management options: moderate  Patient Progress Patient progress: stable    Final Clinical Impression(s) / ED Diagnoses Final diagnoses:  ESRD (end stage renal disease) ([place])  Acute on chronic congestive heart failure, unspecified heart failure type [facility])    Rx / DC Orders ED Discharge Orders     None        Blanchie Dessert, MD 02/10/21 1517

## 2021-02-10 NOTE — Consult Note (Signed)
[facility] Renal Consultation Note    Indication for Consultation:  Management of ESRD/hemodialysis; anemia, hypertension/volume and secondary hyperparathyroidism  HPI: Belinda [name] is a 70 y.o. female with ESRD on HD, HTN,  DMT2, CHF. Recent [facility] admission last month with Granulicatella bacteremia. She completed a course of vancomycin. Presented to the ED this am with worsening dyspnea. Reports ongoing SOB since the evening with an episode emesis this am. CTA was negative for significant acute PE. CXR w/o infiltrates or edema. Labs Na 137, K 4.7, WBC 7.3, Hgb 10.3,  BNP 3044. Asked to see for dialysis needs.   Usual dialysis TTS at [facility]. Didn't feel well enough to go to dialysis today. She has been compliant with treatments and has been leaving at her dry weight. She feels that she has lost weight since her last hospital admission. States she always has a cough because of her "sinuses" Denies sick contacts, fevers, chills.   Past Medical History:  Diagnosis Date   Acute CHF (congestive heart failure) ([place]) 02/02/2018   Anemia    "get monthly injections to boost my HgB" (02/02/2018)   Anxiety    Asthma    Back pain    intractable low back   Chronic neck pain    CKD (chronic kidney disease), stage III (HCC)    on dialysis T/Th/Sa Jeneen Rinks   Depression    Dysrhythmia    palpitations or heart racing periodic. has had for years- Dr Mare Ferrari follows.   GERD (gastroesophageal reflux disease)    History of blood transfusion 2016   After knee replacemnet   Hx MRSA infection    Hyperlipidemia    Hypertension    Migraines    "~ 2/month" (02/02/2018), gets headaches with dialysis   Nerve damage    right hand   Osteoarthritis    "knees, neck" (02/02/2018)   Palpitations    Pneumonia    "twice in 2018" (02/02/2018)   Pulmonary artery hypertension (HCC)    Seasonal allergies    Type II diabetes mellitus (HCC)    insulin dependent - fasting 140-200     Past Surgical History:  Procedure Laterality Date   ABDOMINAL HYSTERECTOMY  03/2001   Abdominal supracervical hysterectomy, left salpingo-oophorectomy   ANTERIOR CERVICAL DECOMP/DISCECTOMY FUSION N/A 01/01/2014   Procedure: CERVICAL THREE-FOUR,CERVICAL FIVE-SIX,CERVICAL SIX-SEVEN ANTERIOR CERVICAL DECOMPRESSION/DISCECTOMY/FUSION;  Surgeon: Kristeen Miss, MD;  Location: [facility];  Service: Neurosurgery;  Laterality: N/A;  left-side approach   AV FISTULA PLACEMENT Right 02/13/2020   Procedure: ARTERIOVENOUS (AV) FISTULA CREATION;  Surgeon: Waynetta Sandy, MD;  Location: [place];  Service: Vascular;  Laterality: Right;   AV FISTULA PLACEMENT Right 08/11/2020   Procedure: INSERTION OF RIGHT THIGH ARTERIOVENOUS (AV) GORE-TEX GRAFT;  Surgeon: Angelia Mould, MD;  Location: [place];  Service: Vascular;  Laterality: Right;   [place] Right 04/16/2020   Procedure: BASCILIC VEIN TRANSPOSITION SECOND STAGE RIGHT;  Surgeon: Waynetta Sandy, MD;  Location: [place];  Service: Vascular;  Laterality: Right;   CATARACT EXTRACTION W/ INTRAOCULAR LENS  IMPLANT, BILATERAL Bilateral 2012   CESAREAN SECTION     COLONOSCOPY W/ POLYPECTOMY     DILATION AND CURETTAGE OF UTERUS  X 2   ESOPHAGOGASTRODUODENOSCOPY     HAND SURGERY Left 1970s   laceration repair 4th and 5th digits   KNEE ARTHROSCOPY Left    torn mensicus   LIGATION OF COMPETING BRANCHES OF ARTERIOVENOUS FISTULA Right 05/07/2020  Procedure: LIGATION OF RIGHT UPPER EXTREMITY FISTULA;  Surgeon: Waynetta Sandy, MD;  Location: [place];  Service: Vascular;  Laterality: Right;   NASAL SINUS SURGERY     RIGHT HEART CATH N/A 06/23/2018   Procedure: RIGHT HEART CATH;  Surgeon: Jolaine Artist, MD;  Location: [place] [facility];  Service: Cardiovascular;  Laterality: N/A;   TEE WITHOUT CARDIOVERSION N/A 01/22/2021   Procedure: TRANSESOPHAGEAL ECHOCARDIOGRAM (TEE);  Surgeon: Skeet Latch, MD;  Location: [place];  Service: Cardiovascular;  Laterality: N/A;   TOTAL KNEE ARTHROPLASTY Left 02/12/2015   Procedure: TOTAL KNEE ARTHROPLASTY;  Surgeon: [name], MD;  Location: [place];  Service: Orthopedics;  Laterality: Left;   Family History  Problem Relation Age of Onset   Heart disease Mother    Heart failure Father    Colon cancer Neg Hx    Stomach cancer Neg Hx    Esophageal cancer Neg Hx    Social History:  reports that she has never smoked. She has never used smokeless tobacco. She reports that she does not drink alcohol and does not use drugs. Allergies  Allergen Reactions   Doxycycline Other (See Comments)   Entresto [Sacubitril-Valsartan] Itching   Metformin Other (See Comments)    weakness   Omnicef [Cefdinir] Other (See Comments)    GI Upset   Aspirin Other (See Comments)    GI issues, Can tolerate low aspirin   Augmentin [Amoxicillin-Pot Clavulanate] Nausea And Vomiting and Other (See Comments)    GI issues DID THE REACTION INVOLVE: Swelling of the face/tongue/throat, SOB, or low BP? No Sudden or severe rash/hives, skin peeling, or the inside of the mouth or nose? No Did it require medical treatment? No When did it last happen?      long time  If all above answers are "NO", may proceed with cephalosporin use.    Erythromycin Nausea And Vomiting and Other (See Comments)    Gi issues   Gabapentin Swelling   Nickel Itching and Rash    Breakouts    Prior to Admission medications   Medication Sig Start Date End Date Taking? Authorizing Provider  acetaminophen (TYLENOL) 650 MG CR tablet Take 650-1,300 mg by mouth every 8 (eight) hours as needed for pain (pain).     [provider]  Alpha-D-Galactosidase (BEANO PO) Take 1-2 tablets by mouth daily as needed (gas). With certain foods.    [provider]  ALPRAZolam Duanne [place]on) 0.5 MG tablet Take 0.25-0.5 mg by mouth 2 (two) times daily as needed for anxiety. 12/13/20   [provider]  aspirin EC 81 MG tablet Take 81 mg by mouth 3 (three) times a week. Swallow whole.    [provider]  Azelastine HCl 0.15 % SOLN Place 2 sprays into both nostrils daily.    [provider]  budesonide-formoterol (SYMBICORT) 160-4.5 MCG/ACT inhaler Inhale 2 puffs into the lungs 2 (two) times daily as needed (cough/respiratory issues.).     [provider]  diclofenac sodium (VOLTAREN) 1 % GEL Apply 1 application topically 4 (four) times daily as needed (neck pain).     [provider]  docusate sodium (COLACE) 100 MG capsule Take 1 capsule (100 mg total) by mouth 2 (two) times daily. 01/25/21   Geradine Girt, DO  ezetimibe-simvastatin (VYTORIN) 10-40 MG per tablet Take 1 tablet by mouth at bedtime.     [provider]  ferric citrate (AURYXIA) 1 GM 210 MG(Fe) tablet Take 210-420 mg by mouth  See admin instructions. Take 2 tablets (420 mg) by mouth 3 times daily with meals & take 1 tablet (210 mg) by mouth with each snack 11/01/19   [provider]  fexofenadine (ALLEGRA) 180 MG tablet Take 180 mg by mouth daily.    [provider]  guaiFENesin (MUCINEX) 600 MG 12 hr tablet Take 600 mg by mouth in the morning and at bedtime.     [provider]  insulin lispro (HUMALOG) 100 UNIT/ML injection Inject 4-10 Units into the skin 3 (three) times daily before meals. Sliding Scale    [provider]  ketotifen (ZADITOR) 0.025 % ophthalmic solution Place 1 drop into both eyes 2 (two) times daily as needed (allergy/dry/irritated eyes.).    [provider]  LANTUS SOLOSTAR 100 UNIT/ML Solostar Pen Inject 18 Units into the skin at bedtime.  04/19/18   [provider]  lidocaine (LIDODERM) 5 % Place 2 patches onto the skin daily. Remove & Discard patch within 12 hours or as directed by MD 01/25/21   Geradine Girt, DO  losartan (COZAAR) 25 MG tablet Take 12.5 mg by mouth daily. 07/11/20   [provider]  Magnesium 200 MG TABS Take 0.5 tablets (100 mg total) by mouth 2 (two) times daily.    [provider]  Methoxy PEG-Epoetin Beta (MIRCERA IJ) Mircera 02/14/20 02/12/21  [provider]  midodrine (PROAMATINE) 10 MG tablet Take 10 mg by mouth 3 (three) times a week. Pre dialysis    [provider]  multivitamin (RENA-VIT) TABS tablet Take 1 tablet by mouth daily with breakfast.  11/01/19   [provider]  nitroGLYCERIN (NITROSTAT) 0.4 MG SL tablet Place 1 tablet (0.4 mg total) under the tongue every 5 (five) minutes x 3 doses as needed for chest pain. 03/14/20   Bensimhon, Shaune Pascal, MD  oxyCODONE (OXY IR/ROXICODONE) 5 MG immediate release tablet Take 1 tablet (5 mg total) by mouth every 6 (six) hours as needed for severe pain. 01/25/21   Geradine Girt, DO  simethicone (MYLICON) 80 MG chewable tablet Chew 160 mg by mouth daily as needed for flatulence (indigestion.).     [provider]  VITAMIN D, CHOLECALCIFEROL, PO Take by mouth 3 (three) times a week. With dialysis    [provider]   Current Facility-Administered Medications  Medication Dose Route Frequency Provider Last Rate Last Admin   [START ON 02/11/2021] Chlorhexidine Gluconate Cloth 2 % PADS 6 each  6 each Topical Q0600 Lynnda Child, PA-C       Current Outpatient Medications  Medication Sig Dispense Refill   acetaminophen (TYLENOL) 650 MG CR tablet Take 650-1,300 mg by mouth every 8 (eight) hours as needed for pain (pain).      Alpha-D-Galactosidase (BEANO PO) Take 1-2 tablets by mouth daily as needed (gas). With certain foods.     ALPRAZolam (XANAX) 0.5 MG tablet Take 0.25-0.5 mg by mouth 2 (two) times daily as needed for anxiety.     aspirin EC 81 MG tablet Take 81 mg by mouth 3 (three) times a week. Swallow whole.     Azelastine HCl 0.15 % SOLN Place 2 sprays into both nostrils daily.     budesonide-formoterol (SYMBICORT) 160-4.5 MCG/ACT inhaler Inhale 2  puffs into the lungs 2 (two) times daily as needed (cough/respiratory issues.).      diclofenac sodium (VOLTAREN) 1 % GEL Apply 1 application topically 4 (four) times daily as needed (neck pain).  docusate sodium (COLACE) 100 MG capsule Take 1 capsule (100 mg total) by mouth 2 (two) times daily. 10 capsule 0   ezetimibe-simvastatin (VYTORIN) 10-40 MG per tablet Take 1 tablet by mouth at bedtime.      ferric citrate (AURYXIA) 1 GM 210 MG(Fe) tablet Take 210-420 mg by mouth See admin instructions. Take 2 tablets (420 mg) by mouth 3 times daily with meals & take 1 tablet (210 mg) by mouth with each snack     fexofenadine (ALLEGRA) 180 MG tablet Take 180 mg by mouth daily.     guaiFENesin (MUCINEX) 600 MG 12 hr tablet Take 600 mg by mouth in the morning and at bedtime.      insulin lispro (HUMALOG) 100 UNIT/ML injection Inject 4-10 Units into the skin 3 (three) times daily before meals. Sliding Scale     ketotifen (ZADITOR) 0.025 % ophthalmic solution Place 1 drop into both eyes 2 (two) times daily as needed (allergy/dry/irritated eyes.).     LANTUS SOLOSTAR 100 UNIT/ML Solostar Pen Inject 18 Units into the skin at bedtime.   3   lidocaine (LIDODERM) 5 % Place 2 patches onto the skin daily. Remove & Discard patch within 12 hours or as directed by MD 60 patch 0   losartan (COZAAR) 25 MG tablet Take 12.5 mg by mouth daily.     Magnesium 200 MG TABS Take 0.5 tablets (100 mg total) by mouth 2 (two) times daily.     Methoxy PEG-Epoetin Beta (MIRCERA IJ) Mircera     midodrine (PROAMATINE) 10 MG tablet Take 10 mg by mouth 3 (three) times a week. Pre dialysis     multivitamin (RENA-VIT) TABS tablet Take 1 tablet by mouth daily with breakfast.      nitroGLYCERIN (NITROSTAT) 0.4 MG SL tablet Place 1 tablet (0.4 mg total) under the tongue every 5 (five) minutes x 3 doses as needed for chest pain. 6 tablet 1   oxyCODONE (OXY IR/ROXICODONE) 5 MG immediate release tablet Take 1 tablet (5 mg total) by mouth every  6 (six) hours as needed for severe pain. 12 tablet 0   simethicone (MYLICON) 80 MG chewable tablet Chew 160 mg by mouth daily as needed for flatulence (indigestion.).      VITAMIN D, CHOLECALCIFEROL, PO Take by mouth 3 (three) times a week. With dialysis       ROS: As per HPI otherwise negative.  Physical Exam: Vitals:   02/10/21 1345 02/10/21 1415 02/10/21 1430 02/10/21 1500  BP: (!) 164/89 (!) 162/93 (!) 174/98 (!) 166/93  Pulse: (!) 107 (!) 108 (!) 106 (!) 107  Resp: (!) 23 16 17 15   Temp:      TempSrc:      SpO2: 96% 98% 100% 96%     General: WDWN, nad on nasal oxygen  Head: NCAT sclera not icteric MMM Neck: Supple. No JVD appreciated  Lungs: Occasional wheeze but overall clear  Heart: RRR with S1 S2 Abdomen: soft non-tender  Lower extremities:without edema or ischemic changes, no open wounds  Neuro: A & O  X 3. Moves all extremities spontaneously. Psych:  Responds to questions appropriately with a normal affect. Dialysis Access: R thigh AVG +bruit   Labs: Basic Metabolic Panel: Recent Labs  Lab 02/10/21 1119  NA 137  K 4.7  CL 93*  CO2 23  GLUCOSE 199*  BUN 37*  CREATININE 7.34*  CALCIUM 9.7   Liver Function Tests: Recent Labs  Lab 02/10/21 1119  AST 20  ALT 15  ALKPHOS 173*  BILITOT 1.7*  PROT 7.0  ALBUMIN 3.6   No results for input(s): LIPASE, AMYLASE in the last 168 hours. No results for input(s): AMMONIA in the last 168 hours. CBC: Recent Labs  Lab 02/10/21 1119  WBC 7.3  NEUTROABS 5.4  HGB 10.3*  HCT 32.9*  MCV 86.8  PLT 245   Cardiac Enzymes: No results for input(s): CKTOTAL, CKMB, CKMBINDEX, TROPONINI in the last 168 hours. CBG: No results for input(s): GLUCAP in the last 168 hours. Iron Studies: No results for input(s): IRON, TIBC, TRANSFERRIN, FERRITIN in the last 72 hours. Studies/Results: DG Chest 2 View  Result Date: 02/10/2021 CLINICAL DATA:  Shortness of breath EXAM: CHEST - 2 VIEW COMPARISON:  01/23/2021 FINDINGS:  Cardiomegaly. Mild aortic tortuosity. There is no edema, consolidation, effusion, or pneumothorax. IMPRESSION: No acute finding. Chronic cardiomegaly. Electronically Signed   By: Monte Fantasia M.D.   On: 02/10/2021 11:22   CT Angio Chest PE W and/or Wo Contrast  Result Date: 02/10/2021 CLINICAL DATA:  Fatigue, shortness of breath and weakness, concern for acute PE. Dialysis patient EXAM: CT ANGIOGRAPHY CHEST WITH CONTRAST TECHNIQUE: Multidetector CT imaging of the chest was performed using the standard protocol during bolus administration of intravenous contrast. Multiplanar CT image reconstructions and MIPs were obtained to evaluate the vascular anatomy. CONTRAST:  41mL OMNIPAQUE IOHEXOL 350 MG/ML SOLN COMPARISON:  01/17/2021 FINDINGS: Cardiovascular: Pulmonary arteries are patent and normal in caliber. No significant filling defect or pulmonary embolus by CTA. Atherosclerotic changes of the aorta. Negative for aneurysm or dissection. No mediastinal hemorrhage or hematoma. Patent 3 vessel arch anatomy. Mild cardiomegaly. Native coronary atherosclerosis. No pericardial effusion. Central venous structures appear patent.  No veno-occlusive process. Mediastinum/Nodes: No enlarged mediastinal, hilar, or axillary lymph nodes. Thyroid gland, trachea, and esophagus demonstrate no significant findings. Lungs/Pleura: Trace dependent pleural effusions and associated minor bibasilar atelectasis. No definite acute airspace process, pneumonia, or significant consolidation. No edema pattern or CHF. Negative for pneumothorax. Trachea and central airways are patent. Upper Abdomen: No acute finding. Suspect gallstones and of right upper quadrant, incompletely imaged. Stable left upper pole exophytic renal cyst. No acute upper abdominal finding. Musculoskeletal: Degenerative changes noted of the spine. 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 Midthoracic degenerative changes most severe at T9-10. No acute  osseous finding. Sternum intact. Lower cervical fusion hardware partially imaged. Review of the MIP images confirms the above findings. IMPRESSION: Negative for significant acute pulmonary embolus by CTA. Stable cardiomegaly without CHF Trace effusions and dependent basilar atelectasis. Cholelithiasis Aortic Atherosclerosis (ICD10-I70.0). Electronically Signed   By: Jerilynn Mages.  Shick M.D.   On: 02/10/2021 14:47    Dialysis Orders:  [facility] TTS 4h 400/A1.5  67kg 2K/2Ca UFP 4  R Thigh AVG Heparin 4000 -Mircera 150 q 2 weeks (last 8/4)  -Calcitriol 1.5 TIW   Assessment/Plan: Dyspnea - No acute findings on CTA. Concern for volume overload related to loss of body weight. Dialysis tonight to try to optimize volume.  ESRD -  HD TTS. Continue on schedule Will plan for HD tonight. Will need Covid swab before she can come to the unit.  Hypertension/volume  - BP elevated. Suspect volume excess -hopefully will improve with UF. Challenging dry weight today.   Anemia  - Stable. No ESA needs.   Metabolic bone disease -  Continue home binders/meds.   Lynnda Child PA-C [facility] 02/10/2021, 3:54 PM

## 2021-02-10 NOTE — H&P (Signed)
History and Physical    Belinda [name] IWO:[id number] DOB: [birth year] DOA: 02/10/2021  Referring MD/NP/PA: Blanchie Dessert, MD PCP: Reynold Bowen, MD  Patient coming from: home   Chief Complaint: Shortness of breath  I have personally briefly reviewed patient's old medical records in [place]   HPI: Belinda [name] is a 70 y.o. female with medical history significant of ESRD on HD, systolic congestive heart failure, anemia of chronic disease presents with complaints of progressively worsening shortness of breath over the last 2 days.  Patient last dialyzed on 8/13 and felt okay that evening.  The next morning patient reported that she started feeling bad.  Noted complaints of upset stomach with nausea and vomiting.  She complains that her stomach just feels uncomfortable.  Emesis has been nonbloody and nonbilious in appearance unlike last time when she was just recently hospitalized every time she vomits she gets diaphoretic and sweaty.  Denies having any significant fever, diarrhea, chest pain, or urinary frequency.  Normally she is not on oxygen at baseline, but noted last night her oxygenation was hovering around 88 to 91%.  She was unable to sleep due to her symptoms and felt better sitting up.  Patient chronically has issues with back pain and sinus congestion secondary to allergies.  Last hospitalized 7/23-7/31 with sepsis secondary to granulicatella adiacens bacteremia.  TEE showed no signs of vegetation.  Unclear the source of symptoms.  Patient ultimately was discharged home to continue IV vancomycin with hemodialysis which she completed on 8/6.   ED Course: Upon admission patient was noted to be afebrile, pulse 10 6-1 10, respirations 12-26, blood pressures elevated up to 174/79, and O2 saturations maintained on room air.  Labs significant for hemoglobin 10.3, potassium 4.7, BUN 37, creatinine 7.34, glucose 199, high-sensitivity troponin 33->36, and BNP 3044.6.  CT angiogram  of the chest showed stable cardiomegaly without CHF, trace pleural effusions, and no signs of a pulmonary embolus.  Nephrology have been formally consulted and plan on dialyzing the patient.  TRH called to admit  Review of Systems  Constitutional:  Positive for malaise/fatigue. Negative for fever.  HENT:  Positive for congestion. Negative for nosebleeds.   Eyes:  Negative for photophobia and pain.  Respiratory:  Positive for cough, sputum production and shortness of breath.   Cardiovascular:  Positive for leg swelling. Negative for chest pain.  Gastrointestinal:  Positive for abdominal pain, nausea and vomiting. Negative for blood in stool.  Genitourinary:  Negative for dysuria and hematuria.  Musculoskeletal:  Positive for back pain. Negative for falls.  Skin:  Negative for rash.  Neurological:  Negative for focal weakness and loss of consciousness.  Endo/Heme/Allergies:  Positive for environmental allergies.  Psychiatric/Behavioral:  Negative for substance abuse. The patient has insomnia.    Past Medical History:  Diagnosis Date   Acute CHF (congestive heart failure) ([facility]) 02/02/2018   Anemia    "get monthly injections to boost my HgB" (02/02/2018)   Anxiety    Asthma    Back pain    intractable low back   Chronic neck pain    CKD (chronic kidney disease), stage III (HCC)    on dialysis T/Th/Sa Jeneen Rinks   Depression    Dysrhythmia    palpitations or heart racing periodic. has had for years- Dr Mare Ferrari follows.   GERD (gastroesophageal reflux disease)    History of blood transfusion 2016   After knee replacemnet   Hx MRSA infection    Hyperlipidemia  Hypertension    Migraines    "~ 2/month" (02/02/2018), gets headaches with dialysis   Nerve damage    right hand   Osteoarthritis    "knees, neck" (02/02/2018)   Palpitations    Pneumonia    "twice in 2018" (02/02/2018)   Pulmonary artery hypertension (HCC)    Seasonal allergies    Type II diabetes mellitus (HCC)     insulin dependent - fasting 140-200     Past Surgical History:  Procedure Laterality Date   ABDOMINAL HYSTERECTOMY  03/2001   Abdominal supracervical hysterectomy, left salpingo-oophorectomy   ANTERIOR CERVICAL DECOMP/DISCECTOMY FUSION N/A 01/01/2014   Procedure: CERVICAL THREE-FOUR,CERVICAL FIVE-SIX,CERVICAL SIX-SEVEN ANTERIOR CERVICAL DECOMPRESSION/DISCECTOMY/FUSION;  Surgeon: Kristeen Miss, MD;  Location: [facility];  Service: Neurosurgery;  Laterality: N/A;  left-side approach   AV FISTULA PLACEMENT Right 02/13/2020   Procedure: ARTERIOVENOUS (AV) FISTULA CREATION;  Surgeon: Waynetta Sandy, MD;  Location: [facility];  Service: Vascular;  Laterality: Right;   AV FISTULA PLACEMENT Right 08/11/2020   Procedure: INSERTION OF RIGHT THIGH ARTERIOVENOUS (AV) GORE-TEX GRAFT;  Surgeon: Angelia Mould, MD;  Location: [facility];  Service: Vascular;  Laterality: Right;   [place] Right 04/16/2020   Procedure: BASCILIC VEIN TRANSPOSITION SECOND STAGE RIGHT;  Surgeon: Waynetta Sandy, MD;  Location: [facility];  Service: Vascular;  Laterality: Right;   CATARACT EXTRACTION W/ INTRAOCULAR LENS  IMPLANT, BILATERAL Bilateral 2012   CESAREAN SECTION     COLONOSCOPY W/ POLYPECTOMY     DILATION AND CURETTAGE OF UTERUS  X 2   ESOPHAGOGASTRODUODENOSCOPY     HAND SURGERY Left 1970s   laceration repair 4th and 5th digits   KNEE ARTHROSCOPY Left    torn mensicus   LIGATION OF COMPETING BRANCHES OF ARTERIOVENOUS FISTULA Right 05/07/2020   Procedure: LIGATION OF RIGHT UPPER EXTREMITY FISTULA;  Surgeon: Waynetta Sandy, MD;  Location: [facility];  Service: Vascular;  Laterality: Right;   NASAL SINUS SURGERY     RIGHT HEART CATH N/A 06/23/2018   Procedure: RIGHT HEART CATH;  Surgeon: Jolaine Artist, MD;  Location: [facility];  Service: Cardiovascular;  Laterality: N/A;   TEE WITHOUT CARDIOVERSION N/A 01/22/2021   Procedure: TRANSESOPHAGEAL  ECHOCARDIOGRAM (TEE);  Surgeon: Skeet Latch, MD;  Location: [facility];  Service: Cardiovascular;  Laterality: N/A;   TOTAL KNEE ARTHROPLASTY Left 02/12/2015   Procedure: TOTAL KNEE ARTHROPLASTY;  Surgeon: [name], MD;  Location: [facility];  Service: Orthopedics;  Laterality: Left;     reports that she has never smoked. She has never used smokeless tobacco. She reports that she does not drink alcohol and does not use drugs.  Allergies  Allergen Reactions   Doxycycline Other (See Comments)   Entresto [Sacubitril-Valsartan] Itching   Metformin Other (See Comments)    weakness   Omnicef [Cefdinir] Other (See Comments)    GI Upset   Aspirin Other (See Comments)    GI issues, Can tolerate low aspirin   Augmentin [Amoxicillin-Pot Clavulanate] Nausea And Vomiting and Other (See Comments)    GI issues DID THE REACTION INVOLVE: Swelling of the face/tongue/throat, SOB, or low BP? No Sudden or severe rash/hives, skin peeling, or the inside of the mouth or nose? No Did it require medical treatment? No When did it last happen?      long time  If all above answers are "NO", may proceed with cephalosporin use.    Erythromycin Nausea And Vomiting and Other (  See Comments)    Gi issues   Gabapentin Swelling   Nickel Itching and Rash    Breakouts     Family History  Problem Relation Age of Onset   Heart disease Mother    Heart failure Father    Colon cancer Neg Hx    Stomach cancer Neg Hx    Esophageal cancer Neg Hx     Prior to Admission medications   Medication Sig Start Date End Date Taking? Authorizing Provider  acetaminophen (TYLENOL) 650 MG CR tablet Take 650-1,300 mg by mouth every 8 (eight) hours as needed for pain (pain).     [provider]  Alpha-D-Galactosidase (BEANO PO) Take 1-2 tablets by mouth daily as needed (gas). With certain foods.    [provider]  ALPRAZolam Duanne Moron) 0.5 MG tablet Take 0.25-0.5 mg by mouth 2 (two) times daily as needed for  anxiety. 12/13/20   [provider]  aspirin EC 81 MG tablet Take 81 mg by mouth 3 (three) times a week. Swallow whole.    [provider]  Azelastine HCl 0.15 % SOLN Place 2 sprays into both nostrils daily.    [provider]  budesonide-formoterol (SYMBICORT) 160-4.5 MCG/ACT inhaler Inhale 2 puffs into the lungs 2 (two) times daily as needed (cough/respiratory issues.).     [provider]  diclofenac sodium (VOLTAREN) 1 % GEL Apply 1 application topically 4 (four) times daily as needed (neck pain).     [provider]  docusate sodium (COLACE) 100 MG capsule Take 1 capsule (100 mg total) by mouth 2 (two) times daily. 01/25/21   Geradine Girt, DO  ezetimibe-simvastatin (VYTORIN) 10-40 MG per tablet Take 1 tablet by mouth at bedtime.     [provider]  ferric citrate (AURYXIA) 1 GM 210 MG(Fe) tablet Take 210-420 mg by mouth See admin instructions. Take 2 tablets (420 mg) by mouth 3 times daily with meals & take 1 tablet (210 mg) by mouth with each snack 11/01/19   [provider]  fexofenadine (ALLEGRA) 180 MG tablet Take 180 mg by mouth daily.    [provider]  guaiFENesin (MUCINEX) 600 MG 12 hr tablet Take 600 mg by mouth in the morning and at bedtime.     [provider]  insulin lispro (HUMALOG) 100 UNIT/ML injection Inject 4-10 Units into the skin 3 (three) times daily before meals. Sliding Scale    [provider]  ketotifen (ZADITOR) 0.025 % ophthalmic solution Place 1 drop into both eyes 2 (two) times daily as needed (allergy/dry/irritated eyes.).    [provider]  LANTUS SOLOSTAR 100 UNIT/ML Solostar Pen Inject 18 Units into the skin at bedtime.  04/19/18   [provider]  lidocaine (LIDODERM) 5 % Place 2 patches onto the skin daily. Remove & Discard patch within 12 hours or as directed by MD 01/25/21   Geradine Girt, DO  losartan (COZAAR) 25 MG tablet Take 12.5 mg by mouth daily.  07/11/20   [provider]  Magnesium 200 MG TABS Take 0.5 tablets (100 mg total) by mouth 2 (two) times daily.    [provider]  Methoxy PEG-Epoetin Beta (MIRCERA IJ) Mircera 02/14/20 02/12/21  [provider]  midodrine (PROAMATINE) 10 MG tablet Take 10 mg by mouth 3 (three) times a week. Pre dialysis    [provider]  multivitamin (RENA-VIT) TABS tablet Take 1 tablet by mouth daily with breakfast.  11/01/19   [provider]  nitroGLYCERIN (NITROSTAT) 0.4 MG SL tablet Place 1 tablet (0.4 mg total) under the tongue every 5 (five) minutes x 3 doses as needed for chest pain. 03/14/20   Bensimhon, Shaune Pascal, MD  oxyCODONE (OXY IR/ROXICODONE) 5 MG immediate release tablet Take 1 tablet (5 mg total) by mouth every 6 (six) hours as needed for severe pain. 01/25/21   Geradine Girt, DO  simethicone (MYLICON) 80 MG chewable tablet Chew 160 mg by mouth daily as needed for flatulence (indigestion.).     [provider]  VITAMIN D, CHOLECALCIFEROL, PO Take by mouth 3 (three) times a week. With dialysis    [provider]    Physical Exam:  Constitutional: Elderly female currently in no acute distress Vitals:   02/10/21 1345 02/10/21 1415 02/10/21 1430 02/10/21 1500  BP: (!) 164/89 (!) 162/93 (!) 174/98 (!) 166/93  Pulse: (!) 107 (!) 108 (!) 106 (!) 107  Resp: (!) 23 16 17 15   Temp:      TempSrc:      SpO2: 96% 98% 100% 96%   Eyes: PERRL, lids and conjunctivae normal ENMT: Mucous membranes are moist. Posterior pharynx clear of any exudate or lesions.  Neck: normal, supple, no masses, no thyromegaly Respiratory: Patient mildly tachypneic with positive crackles appreciated.  Able to talk in complete sentences and currently on 2 L nasal cannula oxygen with O2 saturation remained. Cardiovascular: Tachycardic, no murmurs / rubs / gallops.  Trace lower extremity edema. 2+ pedal pulses.  Right thigh fistula in place Abdomen: No significant  tenderness to palpation appreciated at this time no hepatosplenomegaly. Bowel sounds positive.  Musculoskeletal: no clubbing / cyanosis. No joint deformity upper and lower extremities. Good ROM, no contractures. Normal muscle tone.  Skin: no rashes, lesions, ulcers. No induration Neurologic: CN 2-12 grossly intact. Sensation intact, DTR normal. Strength 5/5 in all 4.  Psychiatric: Normal judgment and insight. Alert and oriented x 3. Normal mood.     Labs on Admission: I have personally reviewed following labs and imaging studies  CBC: Recent Labs  Lab 02/10/21 1119  WBC 7.3  NEUTROABS 5.4  HGB 10.3*  HCT 32.9*  MCV 86.8  PLT 388   Basic Metabolic Panel: Recent Labs  Lab 02/10/21 1119  NA 137  K 4.7  CL 93*  CO2 23  GLUCOSE 199*  BUN 37*  CREATININE 7.34*  CALCIUM 9.7   GFR: CrCl cannot be calculated (Unknown ideal weight.). Liver Function Tests: Recent Labs  Lab 02/10/21 1119  AST 20  ALT 15  ALKPHOS 173*  BILITOT 1.7*  PROT 7.0  ALBUMIN 3.6   No results for input(s): LIPASE, AMYLASE in the last 168 hours. No results for input(s): AMMONIA in the last 168 hours. Coagulation Profile: No results for input(s): INR, PROTIME in the last 168 hours. Cardiac Enzymes: No results for input(s): CKTOTAL, CKMB, CKMBINDEX, TROPONINI in the last 168 hours. BNP (last 3 results) No results for input(s): PROBNP in the last 8760 hours. HbA1C: No results for input(s): HGBA1C in the last 72 hours. CBG: No results for input(s): GLUCAP in the last 168 hours. Lipid Profile: No results for input(s): CHOL, HDL, LDLCALC, TRIG, CHOLHDL, LDLDIRECT in the last 72 hours. Thyroid Function Tests: No results for input(s): TSH, T4TOTAL, FREET4, T3FREE, THYROIDAB in the last 72 hours. Anemia Panel: No results for input(s): VITAMINB12, FOLATE, FERRITIN, TIBC, IRON, RETICCTPCT in the last 72 hours. Urine analysis:    Component Value Date/Time   COLORURINE YELLOW 02/11/2018 1646  APPEARANCEUR CLOUDY (A) 02/11/2018 1646   LABSPEC 1.005 02/11/2018 1646   PHURINE 5.0 02/11/2018 1646   GLUCOSEU NEGATIVE 02/11/2018 1646   HGBUR LARGE (A) 02/11/2018 1646   BILIRUBINUR NEGATIVE 02/11/2018 1646   KETONESUR NEGATIVE 02/11/2018 1646   PROTEINUR NEGATIVE 02/11/2018 1646   NITRITE NEGATIVE 02/11/2018 1646   LEUKOCYTESUR LARGE (A) 02/11/2018 1646   Sepsis Labs: No results found for this or any previous visit (from the past 240 hour(s)).   Radiological Exams on Admission: DG Chest 2 View  Result Date: 02/10/2021 CLINICAL DATA:  Shortness of breath EXAM: CHEST - 2 VIEW COMPARISON:  01/23/2021 FINDINGS: Cardiomegaly. Mild aortic tortuosity. There is no edema, consolidation, effusion, or pneumothorax. IMPRESSION: No acute finding. Chronic cardiomegaly. Electronically Signed   By: Monte Fantasia M.D.   On: 02/10/2021 11:22   CT Angio Chest PE W and/or Wo Contrast  Result Date: 02/10/2021 CLINICAL DATA:  Fatigue, shortness of breath and weakness, concern for acute PE. Dialysis patient EXAM: CT ANGIOGRAPHY CHEST WITH CONTRAST TECHNIQUE: Multidetector CT imaging of the chest was performed using the standard protocol during bolus administration of intravenous contrast. Multiplanar CT image reconstructions and MIPs were obtained to evaluate the vascular anatomy. CONTRAST:  37mL OMNIPAQUE IOHEXOL 350 MG/ML SOLN COMPARISON:  01/17/2021 FINDINGS: Cardiovascular: Pulmonary arteries are patent and normal in caliber. No significant filling defect or pulmonary embolus by CTA. Atherosclerotic changes of the aorta. Negative for aneurysm or dissection. No mediastinal hemorrhage or hematoma. Patent 3 vessel arch anatomy. Mild cardiomegaly. Native coronary atherosclerosis. No pericardial effusion. Central venous structures appear patent.  No veno-occlusive process. Mediastinum/Nodes: No enlarged mediastinal, hilar, or axillary lymph nodes. Thyroid gland, trachea, and esophagus demonstrate no significant  findings. Lungs/Pleura: Trace dependent pleural effusions and associated minor bibasilar atelectasis. No definite acute airspace process, pneumonia, or significant consolidation. No edema pattern or CHF. Negative for pneumothorax. Trachea and central airways are patent. Upper Abdomen: No acute finding. Suspect gallstones and of right upper quadrant, incompletely imaged. Stable left upper pole exophytic renal cyst. No acute upper abdominal finding. Musculoskeletal: Degenerative changes noted of the spine. 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 Midthoracic degenerative changes most severe at T9-10. No acute osseous finding. Sternum intact. Lower cervical fusion hardware partially imaged. Review of the MIP images confirms the above findings. IMPRESSION: Negative for significant acute pulmonary embolus by CTA. Stable cardiomegaly without CHF Trace effusions and dependent basilar atelectasis. Cholelithiasis Aortic Atherosclerosis (ICD10-I70.0). Electronically Signed   By: Jerilynn Mages.  Shick M.D.   On: 02/10/2021 14:47    EKG: Independently reviewed.  Sinus tachycardia at 111 bpm  Assessment/Plan Acute respiratory failure with hypoxia secondary to volume overload secondary to ESRD: Patient presents with complaints of progressively worsening shortness of breath with reports of O2 saturation dropping as low as 88% overnight.  She has been going to dialysis as scheduled.  She chronically deals with sinus congestion and intermittent cough related with allergies.  CT scan of the chest negative for any signs of a pulmonary embolus.  BNP elevated with signs of mild lower extremity edema on physical exam.  Nephrology was formally consulted and plan to take patient for HD -Admit to a medical telemetry bed -Renal/carb modified diet with fluid restriction -Appreciate nephrology consultative services, follow-up for any further recommendation  Elevated troponin: Chronic.  High-sensitivity troponin 33-> 36.  Similar  to previous admission. -Continue to monitor  Nausea and vomiting: Acute.  Patient reports repeated episodes of nausea and vomiting due to upset stomach.   -  Antiemetics as needed  Abdominal pain: Patient reports having generalized abdominal discomfort.  No significant tenderness palpation on physical exam.  She just recently had a CT scan of the abdomen and pelvis on 7/23 which noted distended gallbladder with layering hyperdensity which could like biliary stones but did not give concern for acute cholecystitis at that time. -Check lipase -Check right upper quadrant abdominal ultrasound  Anemia chronic kidney disease: Hemoglobin 10.3 g/dL which appears around patient's baseline. -Continue to monitor  SIRS recent admission for bacteremia: Patient was noted to be tachypneic and tachycardic meeting SIRS criteria.  Recently completed antibiotics with vancomycin on 8/6 for granulicatella adiacens bacteremia.  The patient relates infection to food poisoning. -Check blood culture  Heart failure with preserved EF: BNP was elevated at 3044.9 did not suggest fluid overload.  TEE revealed EF of 60 - 65% on 7/28. -Fluid management with hemodialysis  Essential hypertension -Continue home regimen  Diabetes mellitus type 2: Patient's last hemoglobin A1c is 8 on 01/18/2021.  Home regimen includes a sliding scale of insulin 4-10 units 3 times daily with meals and Lantus 18 units nightly. -Hypoglycemic protocols -CBGs q. before meals and at bedtime with sensitive SSI -Adjust insulin regimen as needed -Consider resuming Lantus once able to tolerate meals  Transaminitis and hyperbilirubinemia: Acute on chronic.  Patient's alkaline phosphatase has been elevated as well as total bilirubin during last hospitalization. -Follow-up right upper quadrant ultrasound -Recheck CMP tomorrow morning  Chronic back pain -Continue Voltaren gel  Gerd -Pepcid IV x1 dose and start Pepcid 20 mg   DVT prophylaxis:  Heparin Code Status: Full Family Communication: Husband updated over the phone Disposition Plan: Home Consults called: Nephrology Admission status: Inpatient, require more than 2 midnight stay for need of hemodialysis  Norval Morton MD [facility]   If 7PM-7AM, please contact night-coverage   02/10/2021, 3:32 PM

## 2021-02-10 NOTE — ED Notes (Signed)
US at bedside

## 2021-02-10 NOTE — ED Provider Notes (Signed)
Emergency Medicine Provider Triage Evaluation Note  LOWELLA KINDLEY , a 70 y.o. female  was evaluated in triage.  Pt complains of fatigue, shortness of breath, weakness.  She is a dialysis patient states that she has been compliant for dialysis schedule however has not had dialysis today.  States that she feels quite short of breath.  Was brought to the ER with 2 L nasal cannula.  Review of Systems  Positive: SOB Negative: Fever  Physical Exam  BP (!) 166/90 (BP Location: Left Arm)   Pulse (!) 110   Temp 97.8 F (36.6 C) (Oral)   Resp (!) 22   SpO2 97%  Gen:   Awake, no distress   Resp:  Normal effort  MSK:   Moves extremities without difficulty  Other:  Mild tachycardia noted.  Mildly tachypneic.  Medical Decision Making  Medically screening exam initiated at 10:32 AM.  Appropriate orders placed.  JAEDAN SCHUMAN was informed that the remainder of the evaluation will be completed by another provider, this initial triage assessment does not replace that evaluation, and the importance of remaining in the ED until their evaluation is complete.     Pati Gallo [place], [place] 02/10/21 1118    Blanchie Dessert, MD 02/10/21 1440

## 2021-02-10 NOTE — ED Notes (Signed)
Pt alert, NAD, calm, interactive, resps e/u, speaking in clear complete sentences, VSS, feel cold, skin W&D.

## 2021-02-10 NOTE — ED Notes (Signed)
This RN called to room by pt d/t nausea and dry heaving. Pt noted to be diaphoretic and actively dry heaving/spitting. Pt states she has not been able to eat in days because this happens to her every time she takes a few bites. Pt reports she took about 3 bites of rice from her dinner tray before this happened. Pt provided with emesis bag and cool wet cloths placed on forehead and back of neck. MD paged requesting something to nausea. Awaiting response.

## 2021-02-11 LAB — RENAL FUNCTION PANEL
Albumin: 3.2 g/dL — ABNORMAL LOW (ref 3.5–5.0)
Anion gap: 16 — ABNORMAL HIGH (ref 5–15)
BUN: 11 mg/dL (ref 8–23)
CO2: 24 mmol/L (ref 22–32)
Calcium: 9 mg/dL (ref 8.9–10.3)
Chloride: 94 mmol/L — ABNORMAL LOW (ref 98–111)
Creatinine, Ser: 3.48 mg/dL — ABNORMAL HIGH (ref 0.44–1.00)
GFR, Estimated: 14 mL/min — ABNORMAL LOW (ref >60)
Glucose, Bld: 117 mg/dL — ABNORMAL HIGH (ref 70–99)
Phosphorus: 2.7 mg/dL (ref 2.5–4.6)
Potassium: 3.6 mmol/L (ref 3.5–5.1)
Sodium: 134 mmol/L — ABNORMAL LOW (ref 135–145)

## 2021-02-11 LAB — GLUCOSE, CAPILLARY
Glucose-Capillary: 123 mg/dL — ABNORMAL HIGH (ref 70–99)
Glucose-Capillary: 90 mg/dL (ref 70–99)
Glucose-Capillary: 100 mg/dL — ABNORMAL HIGH (ref 70–99)
Glucose-Capillary: 183 mg/dL — ABNORMAL HIGH (ref 70–99)
Glucose-Capillary: 225 mg/dL — ABNORMAL HIGH (ref 70–99)
Glucose-Capillary: 226 mg/dL — ABNORMAL HIGH (ref 70–99)

## 2021-02-11 LAB — HEPATIC FUNCTION PANEL
ALT: 13 U/L (ref 0–44)
AST: 20 U/L (ref 15–41)
Albumin: 3.2 g/dL — ABNORMAL LOW (ref 3.5–5.0)
Alkaline Phosphatase: 154 U/L — ABNORMAL HIGH (ref 38–126)
Bilirubin, Direct: 0.2 mg/dL (ref 0.0–0.2)
Indirect Bilirubin: 2.1 mg/dL — ABNORMAL HIGH (ref 0.3–0.9)
Total Bilirubin: 2.3 mg/dL — ABNORMAL HIGH (ref 0.3–1.2)
Total Protein: 6.4 g/dL — ABNORMAL LOW (ref 6.5–8.1)

## 2021-02-11 LAB — HEPATITIS B SURFACE ANTIGEN: Hepatitis B Surface Ag: NONREACTIVE

## 2021-02-11 LAB — LIPASE, BLOOD: Lipase: 37 U/L (ref 11–51)

## 2021-02-11 LAB — HEPATITIS B CORE ANTIBODY, TOTAL: Hep B Core Total Ab: REACTIVE — AB

## 2021-02-11 MED ORDER — NEPRO/CARBSTEADY PO LIQD: 237.0000 mL | Freq: Two times a day (BID) | ORAL | Status: DC

## 2021-02-11 MED ORDER — ENSURE ENLIVE PO LIQD: 237.0000 mL | Freq: Two times a day (BID) | ORAL | Status: DC

## 2021-02-11 MED ORDER — PROSOURCE PLUS PO LIQD
30.0000 mL | Freq: Two times a day (BID) | ORAL | Status: DC
  Administered 2021-02-11 – 2021-02-12 (×2): 30 mL via ORAL
  Filled 2021-02-11 (×2): qty 30

## 2021-02-11 MED ORDER — INSULIN ASPART 100 UNIT/ML IJ SOLN
0.0000 [IU] | Freq: Three times a day (TID) | INTRAMUSCULAR | Status: DC
  Administered 2021-02-11: 2 [IU] via SUBCUTANEOUS
  Administered 2021-02-11 – 2021-02-12 (×3): 3 [IU] via SUBCUTANEOUS
  Administered 2021-02-12: 1 [IU] via SUBCUTANEOUS

## 2021-02-11 NOTE — Progress Notes (Signed)
[facility] Progress Note   Subjective: Seen in room. Completed dialysis last night. Net UF 2.5L. SOB improved this morning. Had some n/v earlier but able to keep breakfast down.   Objective Vitals:   02/11/21 0132 02/11/21 0527 02/11/21 0751 02/11/21 0905  BP:  (!) 145/77  131/65  Pulse:  99  (!) 104  Resp:  16  18  Temp:  98.4 F (36.9 C)  98.5 F (36.9 C)  TempSrc:  Oral  Oral  SpO2:  97% 95% 94%  Weight: 63.1 kg     Height: 5' (1.524 m)         Additional Objective Labs: Basic Metabolic Panel: Recent Labs  Lab 02/10/21 1119 02/11/21 0312  NA 137 134*  K 4.7 3.6  CL 93* 94*  CO2 23 24  GLUCOSE 199* 117*  BUN 37* 11  CREATININE 7.34* 3.48*  CALCIUM 9.7 9.0  PHOS  --  2.7   CBC: Recent Labs  Lab 02/10/21 1119  WBC 7.3  NEUTROABS 5.4  HGB 10.3*  HCT 32.9*  MCV 86.8  PLT 245   Blood Culture    Component Value Date/Time   SDES BLOOD LEFT ANTECUBITAL 02/10/2021 1752   SPECREQUEST  02/10/2021 1752    BOTTLES DRAWN AEROBIC AND ANAEROBIC Blood Culture results may not be optimal due to an excessive volume of blood received in culture bottles   CULT  02/10/2021 1752    NO GROWTH < 24 HOURS Performed at [facility], [place] [address] [place], [place] [address]    REPTSTATUS PENDING 02/10/2021 1752     Physical Exam General: Alert on nasal O2, nad  Heart: RRR Lungs: Clear bilaterally  Abdomen: soft non-tender  Extremities: No sig LE edema  Dialysis Access: R thigh AVG   Medications:   arformoterol  15 mcg Nebulization BID   aspirin EC  81 mg Oral Q M,W,F   azelastine  2 spray Each Nare Daily   budesonide (PULMICORT) nebulizer solution  0.5 mg Nebulization BID   Chlorhexidine Gluconate Cloth  6 each Topical Q0600   ezetimibe  10 mg Oral QHS   And   simvastatin  40 mg Oral QHS   famotidine  20 mg Oral Daily   feeding supplement  237 mL Oral BID BM   feeding supplement (NEPRO CARB STEADY)  237 mL Oral BID BM   guaiFENesin   600 mg Oral BID   heparin  5,000 Units Subcutaneous Q8H   insulin aspart  0-9 Units Subcutaneous TID WC   loratadine  10 mg Oral Daily   losartan  12.5 mg Oral Daily   magnesium oxide  200 mg Oral BID   metoCLOPramide (REGLAN) injection  5 mg Intravenous Q8H   midodrine  10 mg Oral Q T,Th,Sat-1800   multivitamin  1 tablet Oral Q breakfast   sodium chloride flush  3 mL Intravenous Q12H    Dialysis Orders:  [facility] TTS 4h 400/A1.5  67kg 2K/2Ca UFP 4  R Thigh AVG Heparin 4000 -Mircera 150 q 2 weeks (last 8/4)  -Calcitriol 1.5 TIW    Assessment/Plan: Dyspnea - No acute findings on CTA. Concern for volume overload related to loss of body weight. Better with volume off. Plan for lower dry weight at discharge.  N/V -?Gallstones on [place] -per primary  ESRD -  HD TTS. Next HD 8/18.  Hypertension/volume  - BP better with UF. Now below EDW -lower at discharge   Anemia  - Stable. No ESA  needs.   Metabolic bone disease -  Ca/Phos at goal. Continue home meds.   Lynnda Child PA-C [facility] 02/11/2021,11:48 AM

## 2021-02-11 NOTE — Progress Notes (Signed)
PROGRESS NOTE    Belinda [name]  IHK:[id number] DOB: 06-Jul-1950 DOA: 02/10/2021 PCP: Reynold Bowen, MD    Brief Narrative:  Belinda [name] is a 70 y.o. female with medical history significant of ESRD on HD, systolic congestive heart failure, anemia of chronic disease presents with complaints of progressively worsening shortness of breath over the last 2 days.  Patient last dialyzed on 8/13 and felt okay that evening.  The next morning patient reported that she started feeling bad.  Noted complaints of upset stomach with nausea and vomiting.Last hospitalized 7/23-7/31 with sepsis secondary to granulicatella adiacens bacteremia.  TEE showed no signs of vegetation.  Unclear the source of symptoms.  Patient ultimately was discharged home to continue IV vancomycin with hemodialysis which she completed on 8/6. Upon admission patient was noted to be afebrile, pulse 10 6-1 10, respirations 12-26, blood pressures elevated up to 174/79, and O2 saturations maintained on room air.  Labs significant for hemoglobin 10.3, potassium 4.7, BUN 37, creatinine 7.34, glucose 199, high-sensitivity troponin 33->36, and BNP 3044.6.  CT angiogram of the chest showed stable cardiomegaly without CHF, trace pleural effusions, and no signs of a pulmonary embolus.  Nephrology have been formally consulted and plan on dialyzing the patient.  TRH called to admit  Assessment & Plan:   Principal Problem:   Fluid overload Active Problems:   Anemia   Congestive heart failure (HCC)   Essential hypertension   DM (diabetes mellitus), type 2 with renal complications (HCC)   ESRD on hemodialysis (HCC)   GERD (gastroesophageal reflux disease)   Acute respiratory failure with hypoxia (HCC)  Acute respiratory failure with hypoxemia secondary to volume overload secondary to ESRD: CTA negative for PE. Received hemodialysis with improvement of symptoms. Keep on oxygen to keep saturation more than 90%.  Repeat hemodialysis tomorrow  before discharge.  2.5 L net negative after dialysis.  Elevated troponins: Demand ischemia due to #1.  No evidence of acute coronary syndrome.  Nausea and vomiting/abdominal pain: Cause unknown.  Probably due to fluid overload.  Right upper quadrant ultrasound was essentially normal.  Anemia of chronic disease: Is stable.  Chronic diastolic heart failure: As per #1.  Fluid overload secondary to ESRD.  Essential hypertension: Blood pressure stable.  Abnormal liver function test: Mildly increased LFTs.  Right upper quadrant ultrasound negative for acute cholecystitis.  Biliary duct diameter normal.   DVT prophylaxis: heparin injection 5,000 Units Start: 02/10/21 1600   Code Status: Full code Family Communication: None Disposition Plan: Status is: Inpatient  Remains inpatient appropriate because:Inpatient level of care appropriate due to severity of illness  Dispo: The patient is from: Home              Anticipated d/c is to: Home              Patient currently is not medically stable to d/c.   Difficult to place patient No         Consultants:  Nephrology  Procedures:  Routine hemodialysis  Antimicrobials:  None   Subjective: Patient seen and examined in the morning rounds.  Shortness of breath improved after hemodialysis overnight.  Objective: Vitals:   02/11/21 0527 02/11/21 0751 02/11/21 0905 02/11/21 1235  BP: (!) 145/77  131/65 (!) 169/93  Pulse: 99  (!) 104 (!) 105  Resp: 16  18 18   Temp: 98.4 F (36.9 C)  98.5 F (36.9 C) 98.3 F (36.8 C)  TempSrc: Oral  Oral Oral  SpO2: 97% 95% 94% 98%  Weight:      Height:        Intake/Output Summary (Last 24 hours) at 02/11/2021 1610 Last data filed at 02/11/2021 1440 Gross per 24 hour  Intake 930 ml  Output 2500 ml  Net -1570 ml   Filed Weights   02/10/21 2043 02/11/21 0030 02/11/21 0132  Weight: 68.9 kg 66.4 kg 63.1 kg    Examination:  General: Looks comfortable.  Currently on 1 to 2 L of oxygen.   She was eating lunch. Cardiovascular: S1-S2 normal.  No added sounds. Respiratory: Bilateral clear.  No added sound. Gastrointestinal: Soft and nontender.  Bowel sounds present. Ext: Right thigh AV fistula.      Data Reviewed: I have personally reviewed following labs and imaging studies  CBC: Recent Labs  Lab 02/10/21 1119  WBC 7.3  NEUTROABS 5.4  HGB 10.3*  HCT 32.9*  MCV 86.8  PLT 485   Basic Metabolic Panel: Recent Labs  Lab 02/10/21 1119 02/11/21 0312  NA 137 134*  K 4.7 3.6  CL 93* 94*  CO2 23 24  GLUCOSE 199* 117*  BUN 37* 11  CREATININE 7.34* 3.48*  CALCIUM 9.7 9.0  PHOS  --  2.7   GFR: Estimated Creatinine Clearance: 12.6 mL/min (A) (by C-G formula based on SCr of 3.48 mg/dL (H)). Liver Function Tests: Recent Labs  Lab 02/10/21 1119 02/11/21 0312  AST 20 20  ALT 15 13  ALKPHOS 173* 154*  BILITOT 1.7* 2.3*  PROT 7.0 6.4*  ALBUMIN 3.6 3.2*  3.2*   Recent Labs  Lab 02/11/21 0312  LIPASE 37   No results for input(s): AMMONIA in the last 168 hours. Coagulation Profile: No results for input(s): INR, PROTIME in the last 168 hours. Cardiac Enzymes: No results for input(s): CKTOTAL, CKMB, CKMBINDEX, TROPONINI in the last 168 hours. BNP (last 3 results) No results for input(s): PROBNP in the last 8760 hours. HbA1C: No results for input(s): HGBA1C in the last 72 hours. CBG: Recent Labs  Lab 02/10/21 2212 02/11/21 0154 02/11/21 0550 02/11/21 1205  GLUCAP 123* 90 100* 183*   Lipid Profile: No results for input(s): CHOL, HDL, LDLCALC, TRIG, CHOLHDL, LDLDIRECT in the last 72 hours. Thyroid Function Tests: No results for input(s): TSH, T4TOTAL, FREET4, T3FREE, THYROIDAB in the last 72 hours. Anemia Panel: No results for input(s): VITAMINB12, FOLATE, FERRITIN, TIBC, IRON, RETICCTPCT in the last 72 hours. Sepsis Labs: Recent Labs  Lab 02/10/21 1041 02/10/21 1200  LATICACIDVEN 2.4* 1.7    Recent Results (from the past 240 hour(s))  Resp  Panel by RT-PCR (Flu A&B, Covid) Nasopharyngeal Swab     Status: None   Collection Time: 02/10/21  4:17 PM   Specimen: Nasopharyngeal Swab; Nasopharyngeal(NP) swabs in vial transport medium  Result Value Ref Range Status   SARS Coronavirus 2 by RT PCR NEGATIVE NEGATIVE Final    Comment: (NOTE) SARS-CoV-2 target nucleic acids are NOT DETECTED.  The SARS-CoV-2 RNA is generally detectable in upper respiratory specimens during the acute phase of infection. The lowest concentration of SARS-CoV-2 viral copies this assay can detect is 138 copies/mL. A negative result does not preclude SARS-Cov-2 infection and should not be used as the sole basis for treatment or other patient management decisions. A negative result may occur with  improper specimen collection/handling, submission of specimen other than nasopharyngeal swab, presence of viral mutation(s) within the areas targeted by this assay, and inadequate number of viral copies(<138 copies/mL). A negative result must be combined with clinical observations, patient  history, and epidemiological information. The expected result is Negative.  Fact Sheet for Patients:  EntrepreneurPulse.com.au  Fact Sheet for Healthcare Providers:  IncredibleEmployment.be  This test is no t yet approved or cleared by the [place] FDA and  has been authorized for detection and/or diagnosis of SARS-CoV-2 by FDA under an Emergency Use Authorization (EUA). This EUA will remain  in effect (meaning this test can be used) for the duration of the COVID-19 declaration under Section 564(b)(1) of the Act, 21 U.S.C.section 360bbb-3(b)(1), unless the authorization is terminated  or revoked sooner.       Influenza A by PCR NEGATIVE NEGATIVE Final   Influenza B by PCR NEGATIVE NEGATIVE Final    Comment: (NOTE) The Xpert Xpress SARS-CoV-2/FLU/RSV plus assay is intended as an aid in the diagnosis of influenza from Nasopharyngeal  swab specimens and should not be used as a sole basis for treatment. Nasal washings and aspirates are unacceptable for Xpert Xpress SARS-CoV-2/FLU/RSV testing.  Fact Sheet for Patients: EntrepreneurPulse.com.au  Fact Sheet for Healthcare Providers: IncredibleEmployment.be  This test is not yet approved or cleared by the [place] FDA and has been authorized for detection and/or diagnosis of SARS-CoV-2 by FDA under an Emergency Use Authorization (EUA). This EUA will remain in effect (meaning this test can be used) for the duration of the COVID-19 declaration under Section 564(b)(1) of the Act, 21 U.S.C. section 360bbb-3(b)(1), unless the authorization is terminated or revoked.  Performed at [facility], [place] [address], [place], [place] [address]   Culture, blood (single)     Status: None (Preliminary result)   Collection Time: 02/10/21  5:52 PM   Specimen: BLOOD  Result Value Ref Range Status   Specimen Description BLOOD LEFT ANTECUBITAL  Final   Special Requests   Final    BOTTLES DRAWN AEROBIC AND ANAEROBIC Blood Culture results may not be optimal due to an excessive volume of blood received in culture bottles   Culture   Final    NO GROWTH < 24 HOURS Performed at [facility], [place] [address] [place], [place] [address]    Report Status PENDING  Incomplete         Radiology Studies: DG Chest 2 View  Result Date: 02/10/2021 CLINICAL DATA:  Shortness of breath EXAM: CHEST - 2 VIEW COMPARISON:  01/23/2021 FINDINGS: Cardiomegaly. Mild aortic tortuosity. There is no edema, consolidation, effusion, or pneumothorax. IMPRESSION: No acute finding. Chronic cardiomegaly. Electronically Signed   By: Monte Fantasia M.D.   On: 02/10/2021 11:22   CT Angio Chest PE W and/or Wo Contrast  Result Date: 02/10/2021 CLINICAL DATA:  Fatigue, shortness of breath and weakness, concern for acute PE. Dialysis patient EXAM: CT ANGIOGRAPHY CHEST  WITH CONTRAST TECHNIQUE: Multidetector CT imaging of the chest was performed using the standard protocol during bolus administration of intravenous contrast. Multiplanar CT image reconstructions and MIPs were obtained to evaluate the vascular anatomy. CONTRAST:  85mL OMNIPAQUE IOHEXOL 350 MG/ML SOLN COMPARISON:  01/17/2021 FINDINGS: Cardiovascular: Pulmonary arteries are patent and normal in caliber. No significant filling defect or pulmonary embolus by CTA. Atherosclerotic changes of the aorta. Negative for aneurysm or dissection. No mediastinal hemorrhage or hematoma. Patent 3 vessel arch anatomy. Mild cardiomegaly. Native coronary atherosclerosis. No pericardial effusion. Central venous structures appear patent.  No veno-occlusive process. Mediastinum/Nodes: No enlarged mediastinal, hilar, or axillary lymph nodes. Thyroid gland, trachea, and esophagus demonstrate no significant findings. Lungs/Pleura: Trace dependent pleural effusions and associated minor bibasilar atelectasis. No definite acute  airspace process, pneumonia, or significant consolidation. No edema pattern or CHF. Negative for pneumothorax. Trachea and central airways are patent. Upper Abdomen: No acute finding. Suspect gallstones and of right upper quadrant, incompletely imaged. Stable left upper pole exophytic renal cyst. No acute upper abdominal finding. Musculoskeletal: Degenerative changes noted of the spine. 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 Midthoracic degenerative changes most severe at T9-10. No acute osseous finding. Sternum intact. Lower cervical fusion hardware partially imaged. Review of the MIP images confirms the above findings. IMPRESSION: Negative for significant acute pulmonary embolus by CTA. Stable cardiomegaly without CHF Trace effusions and dependent basilar atelectasis. Cholelithiasis Aortic Atherosclerosis (ICD10-I70.0). Electronically Signed   By: Jerilynn Mages.  Shick M.D.   On: 02/10/2021 14:47   US Abdomen  Limited RUQ (LIVER/GB)  Result Date: 02/10/2021 CLINICAL DATA:  Hyperbilirubinemia EXAM: ULTRASOUND ABDOMEN LIMITED RIGHT UPPER QUADRANT COMPARISON:  CT 02/10/2021, 01/17/2021, ultrasound 01/18/2021 FINDINGS: Gallbladder: Echogenic material within the gallbladder lumen with dense shadowing similar compared to prior. Negative sonographic Murphy. Normal wall thickness. Common bile duct: Diameter: 4 mm Liver: No focal lesion identified. Within normal limits in parenchymal echogenicity. Incidental note made of right pleural effusion Other: None. IMPRESSION: 1. Similar echodense shadowing material in the gallbladder lumen suggestive of sandlike stones. Negative for acute cholecystitis. 2. No biliary dilatation 3. Right pleural effusion Electronically Signed   By: Donavan Foil M.D.   On: 02/10/2021 18:14        Scheduled Meds:  (feeding supplement) PROSource Plus  30 mL Oral BID BM   arformoterol  15 mcg Nebulization BID   aspirin EC  81 mg Oral Q M,W,F   azelastine  2 spray Each Nare Daily   budesonide (PULMICORT) nebulizer solution  0.5 mg Nebulization BID   Chlorhexidine Gluconate Cloth  6 each Topical Q0600   ezetimibe  10 mg Oral QHS   And   simvastatin  40 mg Oral QHS   famotidine  20 mg Oral Daily   guaiFENesin  600 mg Oral BID   heparin  5,000 Units Subcutaneous Q8H   insulin aspart  0-9 Units Subcutaneous TID WC   loratadine  10 mg Oral Daily   losartan  12.5 mg Oral Daily   magnesium oxide  200 mg Oral BID   metoCLOPramide (REGLAN) injection  5 mg Intravenous Q8H   midodrine  10 mg Oral Q T,Th,Sat-1800   multivitamin  1 tablet Oral Q breakfast   sodium chloride flush  3 mL Intravenous Q12H   Continuous Infusions:   LOS: 1 day    Time spent: 30 minutes    Barb Merino, MD [facility] Pager [phone]

## 2021-02-11 NOTE — Plan of Care (Signed)

## 2021-02-11 NOTE — Progress Notes (Signed)
Initial Nutrition Assessment  DOCUMENTATION CODES:   Not applicable  INTERVENTION:   -D/c Ensure -D/c Nepro -30 ml Prosource Plus BID, each supplement provides 100 kcals and 15 grams protein -Renal MVI daily  NUTRITION DIAGNOSIS:   Increased nutrient needs related to chronic illness (ESRD on HD) as evidenced by estimated needs.  GOAL:   Patient will meet greater than or equal to 90% of their needs  MONITOR:   PO intake, Supplement acceptance, Labs, Weight trends, Skin, I & O's  REASON FOR ASSESSMENT:   Malnutrition Screening Tool    ASSESSMENT:   Belinda [name] is a 70 y.o. female with medical history significant of ESRD on HD, systolic congestive heart failure, anemia of chronic disease presents with complaints of progressively worsening shortness of breath over the last 2 days.  Patient last dialyzed on 8/13 and felt okay that evening.  The next morning patient reported that she started feeling bad.  Noted complaints of upset stomach with nausea and vomiting.  She complains that her stomach just feels uncomfortable.  Emesis has been nonbloody and nonbilious in appearance unlike last time when she was just recently hospitalized every time she vomits she gets diaphoretic and sweaty.  Denies having any significant fever, diarrhea, chest pain, or urinary frequency.  Normally she is not on oxygen at baseline, but noted last night her oxygenation was hovering around 88 to 91%.  She was unable to sleep due to her symptoms and felt better sitting up.  Patient chronically has issues with back pain and sinus congestion secondary to allergies.  Pt admitted with acute respiratory failure secondary to volume overload.   Reviewed I/O's: -2.3 L x 24 hours   Spoke with pt at bedside, who reports feeling better today. She shares that she has had a decreased appetite over the past 3 weeks, after being hospitalized for bacteremia. Intake has progressively declined. Per pt, she has has eaten  very little to nothing over the past 5 days due to nausea. She was able to consume almost all of her breakfast this morning and was working on lunch at time of visit.    Per pt, her EDW is 67 kg. Pt reports that her dry weight will be adjusted due to weight loss. Reviewed wt hx; pt has experienced a 6.1% wt loss over the past 6 months, which is not significant for time frame.   Discussed importance of good meal and supplement intake to promote healing. Pt does not want to try Nepro supplements, due to lactose intolerance. She shares that she takes Liquicell protein supplement at HD and is interested in trying Prosource to mimic home regimen. RD also reviewed menu and encouraged pt to order coler foods with less odors to help combat nausea.   Medications reviewed and include magnesium oxide and reglan.   Lab Results  Component Value Date   HGBA1C 8.0 (H) 01/18/2021   PTA DM medications are 4-10 units insulin lispro TID and 18 units insulin solostar daily.   Labs reviewed: Na: 134, CBGS: 90-100 (inpatient orders for glycemic control are 0-9 units insulin aspart TID with meals).    NUTRITION - FOCUSED PHYSICAL EXAM:  Flowsheet Row Most Recent Value  Orbital Region No depletion  Upper Arm Region No depletion  Thoracic and Lumbar Region No depletion  Buccal Region No depletion  Temple Region No depletion  Clavicle Bone Region No depletion  Clavicle and Acromion Bone Region No depletion  Scapular Bone Region No depletion  Dorsal Hand No  depletion  Patellar Region No depletion  Anterior Thigh Region No depletion  Posterior Calf Region No depletion  Edema (RD Assessment) None  Hair Reviewed  Eyes Reviewed  Mouth Reviewed  Skin Reviewed  Nails Reviewed       Diet Order:   Diet Order             Diet renal/carb modified with fluid restriction Diet-HS Snack? Nothing; Fluid restriction: 1200 mL Fluid; Room service appropriate? Yes; Fluid consistency: Thin  Diet effective now                    EDUCATION NEEDS:   Education needs have been addressed  Skin:  Skin Assessment: Reviewed RN Assessment  Last BM:  02/10/21  Height:   Ht Readings from Last 1 Encounters:  02/11/21 5' (1.524 m)    Weight:   Wt Readings from Last 1 Encounters:  02/11/21 63.1 kg    Ideal Body Weight:  45.5 kg  BMI:  Body mass index is 27.17 kg/m.  Estimated Nutritional Needs:   Kcal:  1700-1900  Protein:  80-95 grams  Fluid:  1000 ml + UOP    Loistine Chance, RD, LDN, CDCES Registered Dietitian II Certified Diabetes Care and Education Specialist Please refer to [facility] for RD and/or RD on-call/weekend/after hours pager

## 2021-02-12 LAB — GLUCOSE, CAPILLARY
Glucose-Capillary: 221 mg/dL — ABNORMAL HIGH (ref 70–99)
Glucose-Capillary: 231 mg/dL — ABNORMAL HIGH (ref 70–99)
Glucose-Capillary: 125 mg/dL — ABNORMAL HIGH (ref 70–99)

## 2021-02-12 LAB — RENAL FUNCTION PANEL
Albumin: 3 g/dL — ABNORMAL LOW (ref 3.5–5.0)
Anion gap: 13 (ref 5–15)
BUN: 35 mg/dL — ABNORMAL HIGH (ref 8–23)
CO2: 28 mmol/L (ref 22–32)
Calcium: 8.8 mg/dL — ABNORMAL LOW (ref 8.9–10.3)
Chloride: 93 mmol/L — ABNORMAL LOW (ref 98–111)
Creatinine, Ser: 6.19 mg/dL — ABNORMAL HIGH (ref 0.44–1.00)
GFR, Estimated: 7 mL/min — ABNORMAL LOW (ref >60)
Glucose, Bld: 248 mg/dL — ABNORMAL HIGH (ref 70–99)
Phosphorus: 4.3 mg/dL (ref 2.5–4.6)
Potassium: 3.9 mmol/L (ref 3.5–5.1)
Sodium: 134 mmol/L — ABNORMAL LOW (ref 135–145)

## 2021-02-12 LAB — HEPATITIS B SURFACE ANTIBODY, QUANTITATIVE: Hep B S AB Quant (Post): 10.3 m[IU]/mL (ref >9.9)

## 2021-02-12 MED ORDER — LIDOCAINE HCL (PF) 1 % IJ SOLN: 5.0000 mL | INTRAMUSCULAR | Status: DC | PRN

## 2021-02-12 MED ORDER — SODIUM CHLORIDE 0.9 % IV SOLN: 100.0000 mL | INTRAVENOUS | Status: DC | PRN

## 2021-02-12 MED ORDER — PENTAFLUOROPROP-TETRAFLUOROETH EX AERO: 1.0000 "application " | INHALATION_SPRAY | CUTANEOUS | Status: DC | PRN

## 2021-02-12 MED ORDER — HEPARIN SODIUM (PORCINE) 1000 UNIT/ML DIALYSIS
1000.0000 [IU] | INTRAMUSCULAR | Status: DC | PRN
  Filled 2021-02-12: qty 1

## 2021-02-12 MED ORDER — LOPERAMIDE HCL 2 MG PO CAPS
2.0000 mg | ORAL_CAPSULE | Freq: Four times a day (QID) | ORAL | Status: DC | PRN
  Administered 2021-02-12: 2 mg via ORAL
  Filled 2021-02-12: qty 1

## 2021-02-12 MED ORDER — HEPARIN SODIUM (PORCINE) 1000 UNIT/ML DIALYSIS: 20.0000 [IU]/kg | INTRAMUSCULAR | Status: DC | PRN

## 2021-02-12 MED ORDER — ALTEPLASE 2 MG IJ SOLR: 2.0000 mg | Freq: Once | INTRAMUSCULAR | Status: DC | PRN

## 2021-02-12 MED ORDER — LIDOCAINE-PRILOCAINE 2.5-2.5 % EX CREA
1.0000 "application " | TOPICAL_CREAM | CUTANEOUS | Status: DC | PRN
  Filled 2021-02-12: qty 5

## 2021-02-12 MED ORDER — LIDOCAINE-PRILOCAINE 2.5-2.5 % EX CREA
1.0000 | TOPICAL_CREAM | CUTANEOUS | Status: DC | PRN
Start: 2021-02-13 — End: 2021-02-12

## 2021-02-12 MED ORDER — HEPARIN SODIUM (PORCINE) 1000 UNIT/ML DIALYSIS
3000.0000 [IU] | Freq: Once | INTRAMUSCULAR | Status: DC
  Administered 2021-02-12: 3000 [IU] via INTRAVENOUS_CENTRAL
  Filled 2021-02-12: qty 3

## 2021-02-12 NOTE — Progress Notes (Signed)
[facility] Progress Note   Subjective: Seen in room. Breathing much better. No nausea/vomiting. Dialysis today.    Objective Vitals:   02/12/21 0007 02/12/21 0320 02/12/21 0450 02/12/21 0836  BP: (!) 141/80  (!) 141/79 (!) 158/86  Pulse: 100  98 100  Resp: 16  16 20   Temp: 98.5 F (36.9 C)  98.9 F (37.2 C) 98.8 F (37.1 C)  TempSrc: Oral  Oral Oral  SpO2: 98%  100% 100%  Weight:  64.4 kg    Height:          Additional Objective Labs: Basic Metabolic Panel: Recent Labs  Lab 02/10/21 1119 02/11/21 0312 02/12/21 0046  NA 137 134* 134*  K 4.7 3.6 3.9  CL 93* 94* 93*  CO2 23 24 28   GLUCOSE 199* 117* 248*  BUN 37* 11 35*  CREATININE 7.34* 3.48* 6.19*  CALCIUM 9.7 9.0 8.8*  PHOS  --  2.7 4.3    CBC: Recent Labs  Lab 02/10/21 1119  WBC 7.3  NEUTROABS 5.4  HGB 10.3*  HCT 32.9*  MCV 86.8  PLT 245    Blood Culture    Component Value Date/Time   SDES BLOOD LEFT ANTECUBITAL 02/10/2021 1752   SPECREQUEST  02/10/2021 1752    BOTTLES DRAWN AEROBIC AND ANAEROBIC Blood Culture results may not be optimal due to an excessive volume of blood received in culture bottles   CULT  02/10/2021 1752    NO GROWTH < 24 HOURS Performed at [facility], [place] [address] [place], [place] [address]    REPTSTATUS PENDING 02/10/2021 1752     Physical Exam General: Alert on nasal O2, nad  Heart: RRR Lungs: Clear bilaterally  Abdomen: soft non-tender  Extremities: No sig LE edema  Dialysis Access: R thigh AVG   Medications:   (feeding supplement) PROSource Plus  30 mL Oral BID BM   arformoterol  15 mcg Nebulization BID   aspirin EC  81 mg Oral Q M,W,F   azelastine  2 spray Each Nare Daily   budesonide (PULMICORT) nebulizer solution  0.5 mg Nebulization BID   Chlorhexidine Gluconate Cloth  6 each Topical Q0600   ezetimibe  10 mg Oral QHS   And   simvastatin  40 mg Oral QHS   famotidine  20 mg Oral Daily   guaiFENesin  600 mg Oral BID   heparin   5,000 Units Subcutaneous Q8H   insulin aspart  0-9 Units Subcutaneous TID WC   loratadine  10 mg Oral Daily   losartan  12.5 mg Oral Daily   magnesium oxide  200 mg Oral BID   metoCLOPramide (REGLAN) injection  5 mg Intravenous Q8H   midodrine  10 mg Oral Q T,Th,Sat-1800   multivitamin  1 tablet Oral Q breakfast   sodium chloride flush  3 mL Intravenous Q12H    Dialysis Orders:  [facility] TTS 4h 400/A1.5  67kg 2K/2Ca UFP 4  R Thigh AVG Heparin 4000 -Mircera 150 q 2 weeks (last 8/4)  -Calcitriol 1.5 TIW    Assessment/Plan: Dyspnea - No acute findings on CTA. Concern for volume overload related to loss of body weight. Better with volume off. Plan for lower dry weight at discharge.  N/V -- Improved -per primary  ESRD -  HD TTS. Next HD 8/18.  Hypertension/volume  - BP better with UF. Now below EDW -lower at discharge   Anemia  - Stable. No ESA needs.   Metabolic bone disease -  Ca/Phos at  goal. Continue home meds.   Lynnda Child PA-C [facility] 02/12/2021,11:22 AM

## 2021-02-12 NOTE — Discharge Summary (Signed)
Physician Discharge Summary  SHOLANDA CROSON RWE:[id number] DOB: 02/16/1951 DOA: 02/10/2021  PCP: Reynold Bowen, MD  Admit date: 02/10/2021 Discharge date: 02/12/2021  Admitted From: Home Disposition: Home  Recommendations for Outpatient Follow-up:  Go to hemodialysis on Saturday as scheduled.  Home Health: Not needed Equipment/Devices: Not applicable  Discharge Condition: Stable CODE STATUS: Full code Diet recommendation: Low-salt low-carb diet  Discharge summary: 70 y.o. female with medical history significant of ESRD on HD, systolic congestive heart failure, anemia of chronic disease presents with complaints of progressively worsening shortness of breath over the last 2 days.  Patient last dialyzed on 8/13 and felt okay that evening.  The next morning patient reported that she started feeling bad.  Noted complaints of upset stomach with nausea and vomiting.Last hospitalized 7/23-7/31 with sepsis secondary to granulicatella adiacens bacteremia.  TEE showed no signs of vegetation.  Unclear the source of symptoms.  Patient ultimately was discharged home to continue IV vancomycin with hemodialysis which she completed on 8/6. Upon admission patient was noted to be afebrile, pulse 10 6-1 10, respirations 12-26, blood pressures elevated up to 174/79, and O2 saturations maintained on room air.  Labs significant for hemoglobin 10.3, potassium 4.7, BUN 37, creatinine 7.34, glucose 199, high-sensitivity troponin 33->36, and BNP 3044.6.  CT angiogram of the chest showed stable cardiomegaly without CHF, trace pleural effusions, and no signs of a pulmonary embolus.  Acute respiratory failure with hypoxemia secondary to volume overload secondary to ESRD: CTA negative for PE. Received hemodialysis with improvement of symptoms. Patient received second consecutive hemodialysis today.  Clinically improved.  On room air.  Next dialysis on 8/20. Patient was recently treated for bacteremia, repeat blood  cultures are negative with no evidence of ongoing infection.   Elevated troponins: Demand ischemia due to #1.  No evidence of acute coronary syndrome.   Nausea and vomiting/abdominal pain: Cause unknown.  Probably due to fluid overload.  Right upper quadrant ultrasound was essentially normal.   Essential hypertension: Blood pressure stable.   Abnormal liver function test: Mildly increased LFTs.  Right upper quadrant ultrasound negative for acute cholecystitis.  Biliary duct diameter normal.  Patient is medically stabilized.  If she looks comfortable after dialysis she will be going home tonight.   Discharge Diagnoses:  Principal Problem:   Fluid overload Active Problems:   Anemia   Congestive heart failure (HCC)   Essential hypertension   DM (diabetes mellitus), type 2 with renal complications (HCC)   ESRD on hemodialysis (HCC)   GERD (gastroesophageal reflux disease)   Acute respiratory failure with hypoxia [facility])    Discharge Instructions  Discharge Instructions     Call MD for:  difficulty breathing, headache or visual disturbances   Complete by: As directed    Call MD for:  persistant nausea and vomiting   Complete by: As directed    Call MD for:  severe uncontrolled pain   Complete by: As directed    Diet - low sodium heart healthy   Complete by: As directed    Diet Carb Modified   Complete by: As directed    Increase activity slowly   Complete by: As directed    No wound care   Complete by: As directed       Allergies as of 02/12/2021       Reactions   Doxycycline Other (See Comments)   [name] [sacubitril-valsartan] Itching   Metformin Other (See Comments)   weakness   Omnicef [cefdinir] Other (See Comments)   GI  Upset   Aspirin Other (See Comments)   GI issues, Can tolerate low aspirin   Augmentin [amoxicillin-pot Clavulanate] Nausea And Vomiting, Other (See Comments)   GI issues DID THE REACTION INVOLVE: Swelling of the face/tongue/throat, SOB, or  low BP? No Sudden or severe rash/hives, skin peeling, or the inside of the mouth or nose? No Did it require medical treatment? No When did it last happen?      long time  If all above answers are "NO", may proceed with cephalosporin use.   Erythromycin Nausea And Vomiting, Other (See Comments)   Gi issues   Gabapentin Swelling   Nickel Itching, Rash   Breakouts         Medication List     STOP taking these medications    docusate sodium 100 MG capsule Commonly known as: COLACE   lidocaine 5 % Commonly known as: LIDODERM   oxyCODONE 5 MG immediate release tablet Commonly known as: Oxy IR/ROXICODONE       TAKE these medications    acetaminophen 650 MG CR tablet Commonly known as: TYLENOL Take 650-1,300 mg by mouth every 8 (eight) hours as needed for pain (pain).   ALPRAZolam 0.5 MG tablet Commonly known as: XANAX Take 0.25-0.5 mg by mouth 2 (two) times daily as needed for anxiety.   aspirin EC 81 MG tablet Take 81 mg by mouth every Monday, Wednesday, and Friday. Swallow whole.   Azelastine HCl 0.15 % Soln Place 2 sprays into both nostrils daily.   BEANO PO Take 1-2 tablets by mouth daily as needed (gas). With certain foods.   budesonide-formoterol 160-4.5 MCG/ACT inhaler Commonly known as: SYMBICORT Inhale 2 puffs into the lungs 2 (two) times daily as needed (cough/respiratory issues.).   diclofenac sodium 1 % Gel Commonly known as: VOLTAREN Apply 1 application topically 4 (four) times daily as needed (back and knee).   ezetimibe-simvastatin 10-40 MG tablet Commonly known as: VYTORIN Take 1 tablet by mouth at bedtime.   ferric citrate 1 GM 210 MG(Fe) tablet Commonly known as: AURYXIA Take 210-420 mg by mouth See admin instructions. Take 2 tablets (420 mg) by mouth 3 times daily with meals & take 1 tablet (210 mg) by mouth with each snack   fexofenadine 180 MG tablet Commonly known as: ALLEGRA Take 180 mg by mouth daily.   guaiFENesin 600 MG 12 hr  tablet Commonly known as: MUCINEX Take 600 mg by mouth in the morning and at bedtime.   insulin lispro 100 UNIT/ML injection Commonly known as: HUMALOG Inject 4-10 Units into the skin 3 (three) times daily before meals. Sliding Scale   Lantus SoloStar 100 UNIT/ML Solostar Pen Generic drug: insulin glargine Inject 18 Units into the skin at bedtime.   losartan 25 MG tablet Commonly known as: COZAAR Take 12.5 mg by mouth daily.   Magnesium 200 MG Tabs Take 0.5 tablets (100 mg total) by mouth 2 (two) times daily.   midodrine 10 MG tablet Commonly known as: PROAMATINE Take 10 mg by mouth every Tuesday, Thursday, and Saturday at 6 PM. Pre dialysis   MIRCERA IJ Mircera   multivitamin Tabs tablet Take 1 tablet by mouth daily with breakfast.   nitroGLYCERIN 0.4 MG SL tablet Commonly known as: NITROSTAT Place 1 tablet (0.4 mg total) under the tongue every 5 (five) minutes x 3 doses as needed for chest pain.   olopatadine 0.1 % ophthalmic solution Commonly known as: PATANOL Place 1 drop into both eyes daily as needed (dry itchy eyes).  simethicone 80 MG chewable tablet Commonly known as: MYLICON Chew 626 mg by mouth daily as needed for flatulence (indigestion.).   VITAMIN D (CHOLECALCIFEROL) PO Take 1 tablet by mouth Every Tuesday,Thursday,and Saturday with dialysis.        Follow-up Information     Reynold Bowen, MD Follow up in 2 week(s).   Specialty: Endocrinology Contact information: [place] [address] [phone]         Pixie Casino, MD .   Specialty: Cardiology Contact information: [place] [address] [phone]         Bensimhon, Shaune Pascal, MD .   Specialty: Cardiology Contact information: [address] [place]  [address] [phone]                Allergies  Allergen Reactions   Doxycycline Other (See Comments)   [name] [Sacubitril-Valsartan] Itching    Metformin Other (See Comments)    weakness   Omnicef [Cefdinir] Other (See Comments)    GI Upset   Aspirin Other (See Comments)    GI issues, Can tolerate low aspirin   Augmentin [Amoxicillin-Pot Clavulanate] Nausea And Vomiting and Other (See Comments)    GI issues DID THE REACTION INVOLVE: Swelling of the face/tongue/throat, SOB, or low BP? No Sudden or severe rash/hives, skin peeling, or the inside of the mouth or nose? No Did it require medical treatment? No When did it last happen?      long time  If all above answers are "NO", may proceed with cephalosporin use.    Erythromycin Nausea And Vomiting and Other (See Comments)    Gi issues   Gabapentin Swelling   Nickel Itching and Rash    Breakouts     Consultations: Nephrology   Procedures/Studies: DG Chest 1 View  Result Date: 01/17/2021 CLINICAL DATA:  Weakness. EXAM: CHEST  1 VIEW COMPARISON:  March 19, 2019. FINDINGS: Stable cardiomediastinal silhouette. No pneumothorax is noted. Hypoinflation of the lungs is noted with minimal bibasilar subsegmental atelectasis. Bony thorax is unremarkable. IMPRESSION: Hypoinflation of the lungs is noted with minimal bibasilar subsegmental atelectasis. Electronically Signed   By: Marijo Conception M.D.   On: 01/17/2021 18:29   DG Chest 2 View  Result Date: 02/10/2021 CLINICAL DATA:  Shortness of breath EXAM: CHEST - 2 VIEW COMPARISON:  01/23/2021 FINDINGS: Cardiomegaly. Mild aortic tortuosity. There is no edema, consolidation, effusion, or pneumothorax. IMPRESSION: No acute finding. Chronic cardiomegaly. Electronically Signed   By: Monte Fantasia M.D.   On: 02/10/2021 11:22   CT Angio Chest PE W and/or Wo Contrast  Result Date: 02/10/2021 CLINICAL DATA:  Fatigue, shortness of breath and weakness, concern for acute PE. Dialysis patient EXAM: CT ANGIOGRAPHY CHEST WITH CONTRAST TECHNIQUE: Multidetector CT imaging of the chest was performed using the standard protocol during bolus  administration of intravenous contrast. Multiplanar CT image reconstructions and MIPs were obtained to evaluate the vascular anatomy. CONTRAST:  53mL OMNIPAQUE IOHEXOL 350 MG/ML SOLN COMPARISON:  01/17/2021 FINDINGS: Cardiovascular: Pulmonary arteries are patent and normal in caliber. No significant filling defect or pulmonary embolus by CTA. Atherosclerotic changes of the aorta. Negative for aneurysm or dissection. No mediastinal hemorrhage or hematoma. Patent 3 vessel arch anatomy. Mild cardiomegaly. Native coronary atherosclerosis. No pericardial effusion. Central venous structures appear patent.  No veno-occlusive process. Mediastinum/Nodes: No enlarged mediastinal, hilar, or axillary lymph nodes. Thyroid gland, trachea, and esophagus demonstrate no significant findings. Lungs/Pleura: Trace dependent pleural effusions and associated minor bibasilar  atelectasis. No definite acute airspace process, pneumonia, or significant consolidation. No edema pattern or CHF. Negative for pneumothorax. Trachea and central airways are patent. Upper Abdomen: No acute finding. Suspect gallstones and of right upper quadrant, incompletely imaged. Stable left upper pole exophytic renal cyst. No acute upper abdominal finding. Musculoskeletal: Degenerative changes noted of the spine. 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 Midthoracic degenerative changes most severe at T9-10. No acute osseous finding. Sternum intact. Lower cervical fusion hardware partially imaged. Review of the MIP images confirms the above findings. IMPRESSION: Negative for significant acute pulmonary embolus by CTA. Stable cardiomegaly without CHF Trace effusions and dependent basilar atelectasis. Cholelithiasis Aortic Atherosclerosis (ICD10-I70.0). Electronically Signed   By: Jerilynn Mages.  Shick M.D.   On: 02/10/2021 14:47   CT Angio Chest PE W and/or Wo Contrast  Addendum Date: 01/18/2021   ADDENDUM REPORT: 01/18/2021 00:36 ADDENDUM: 8 mm  ground-glass opacity seen in the left lung apex. Possibly an acute infectious or inflammatory process though neoplasm is not fully excluded. Initial follow-up with CT at 6-12 months is recommended to confirm persistence. If persistent, repeat CT is recommended every 2 years until 5 years of stability has been established. This recommendation follows the consensus statement: Guidelines for Management of Incidental Pulmonary Nodules Detected on CT Images: From the Fleischner Society 2017; Radiology 2017; 284:228-243. Electronically Signed   By: Lovena Le M.D.   On: 01/18/2021 00:36   Result Date: 01/18/2021 CLINICAL DATA:  Chest discomfort, nausea and vomiting EXAM: CT ANGIOGRAPHY CHEST CT ABDOMEN AND PELVIS WITH CONTRAST TECHNIQUE: Multidetector CT imaging of the chest was performed using the standard protocol during bolus administration of intravenous contrast. Multiplanar CT image reconstructions and MIPs were obtained to evaluate the vascular anatomy. Multidetector CT imaging of the abdomen and pelvis was performed using the standard protocol during bolus administration of intravenous contrast. CONTRAST:  27mL OMNIPAQUE IOHEXOL 350 MG/ML SOLN COMPARISON:  Radiograph 01/17/2021 some ultrasound abdomen 04/21/2018 FINDINGS: CTA CHEST FINDINGS Cardiovascular: Satisfactory opacification the pulmonary arteries to the segmental level. No pulmonary artery filling defects are identified. Central pulmonary arteries are normal caliber. Cardiomegaly. Small pericardial effusion. Suboptimal opacification of the thoracic aorta for luminal assessment. Atherosclerotic plaque within the normal caliber aorta. No gross acute aortic abnormality is evident. Normal 3 vessel branching of the aortic arch. Proximal great vessels are unremarkable. Mediastinum/Nodes: No mediastinal fluid or gas. Normal thyroid gland and thoracic inlet. No acute abnormality of the trachea or esophagus. No worrisome mediastinal, hilar or axillary  adenopathy. Lungs/Pleura: Low lung volumes and mixed dependent and subsegmental atelectatic changes. 8 mm ground-glass opacity seen in left upper lobe (4/25). No other consolidative opacities in the lungs. No pneumothorax. No layering effusion. No other concerning nodules or masses. Musculoskeletal: No acute osseous abnormality or suspicious osseous lesion. Exaggerated thoracic kyphosis with multilevel bony fusion across the vertebral bodies and posterior elements noted T9-T12 as well as more pronounced spondylitic changes at the adjacent T7-T8, T8-T9 levels. Prior cervical fusion noted as well. No acute or worrisome osseous abnormality. Minimal degenerative changes of the shoulders. No worrisome chest wall mass or lesion. Diffuse body wall edema. Review of the MIP images confirms the above findings. CT ABDOMEN and PELVIS FINDINGS Hepatobiliary: No worrisome focal liver lesions. Smooth liver surface contour. Normal hepatic attenuation. Gallbladder is mildly distended with some layering hyperdense material, could reflect biliary sand or if patient has received additional contrast media in recent days, possible vicarious extravasation of contrast media. No visible intraductal gallstones or significant biliary  ductal dilatation. Pancreas: Normal tapering of the distal common bile duct. No pancreatic inflammation or discernible pancreatic lesion. Spleen: Normal in size. No concerning splenic lesions. Small accessory splenule posteriorly. Adrenals/Urinary Tract: Normal adrenals. Kidneys are normally located with symmetric enhancement and excretion. Fluid attenuation cyst seen in the upper pole right kidney measuring up to 2.2 cm in size. No suspicious renal lesion, urolithiasis or hydronephrosis. Urinary bladder is largely decompressed at the time of exam and therefore poorly evaluated by CT imaging. No gross bladder abnormality accounting for underdistention. Stomach/Bowel: Distal esophagus unremarkable. Stomach and  duodenum are free of acute abnormality with a normal duodenal sweep across the midline abdomen. No small bowel thickening or dilatation. No evidence of high-grade small bowel obstruction. No clear colonic thickening or dilatation accounting for varying degrees of underdistention. Appendix is not visualized. No focal inflammation the vicinity of the cecum to suggest an occult appendicitis. Vascular/Lymphatic: Atherosclerotic calcifications within the abdominal aorta and branch vessels. No aneurysm or ectasia. Small amount of fluid seen surrounding the origin of a right common femoral arteriovenous fistula graft (6/68). No pathologically enlarged abdominopelvic lymph nodes. Reproductive: Retroverted uterus with calcified uterine fibroids. No concerning adnexal mass. Other: Diffuse body wall edema. Mild edematous changes in the mesentery. No abdominopelvic free air or fluid. No bowel containing hernia. Postsurgical changes of the anterior abdominal wall including a small umbilical hernia into which protrudes portion of the anti mesenteric transverse colon (6/39) albeit without evidence of vascular compromise or obstruction at this level. Musculoskeletal: Diffuse osseous manifestations of renal osteodystrophy. Grade 2 anterolisthesis L4 on L5 without spondylolysis. Multilevel discogenic and facet degenerative changes are maximal at the L4-5 level as well. Additional degenerative changes in the bilateral hips and additional arthrosis at the SI joints and symphysis pubis. Review of the MIP images confirms the above findings. IMPRESSION: 1. No evidence of pulmonary artery embolism. 2. Cardiomegaly and small pericardial effusion. 3. Diffuse body wall edema could reflect early volume overload/anasarca. 4. Distended gallbladder with layering hyperdensity, could reflect tiny biliary stones/sand or vicarious extravasation of contrast if patient has had a recent contrasted exam. While there is no pericholecystic fluid or  inflammation, could correlate for right upper quadrant symptoms as isolated distension can be an early sign of developing cholecystitis. No biliary ductal dilatation or intraductal gallstones. 5. Small amount of fluid surrounding the anastomosis of a right femoral AV fistula in the right inguinal chain. Could reflect small chronic postoperative seroma though should correlate fistula function and local assessment of the inguinal region. 6. Small broad-based umbilical hernia into which protrudes portion of the anti mesenteric transverse colon without evidence of vascular compromise or obstruction. 7. Diffuse osseous manifestations of renal osteodystrophy. Electronically Signed: By: Lovena Le M.D. On: 01/18/2021 00:18   MR THORACIC SPINE WO CONTRAST  Result Date: 01/21/2021 CLINICAL DATA:  Initial evaluation for worsening mid back pain, history of bacteremia. EXAM: MRI THORACIC SPINE WITHOUT CONTRAST TECHNIQUE: Multiplanar, multisequence MR imaging of the thoracic spine was performed. No intravenous contrast was administered. COMPARISON:  None available. FINDINGS: Alignment: Dextroscoliosis with mild exaggeration of the normal thoracic kyphosis. No significant listhesis. Vertebrae: Susceptibility artifact from prior ACDF noted at C3-C7 on counter sequence. Vertebral body height maintained without acute or chronic fracture. Bone marrow signal intensity is diffusely decreased and heterogeneous on T1 weighted sequence, most likely related to history of end-stage renal disease and anemia. Extensive discogenic reactive endplate change present throughout the midthoracic spine extending from T6-7 through T11-12, favored to be degenerative in  nature due to underlying scoliotic curvature. Associated marrow edema seen within the endplates adjacent to the T8-9 interspace, and to a lesser extent the T1-2, T6-7 and T7-8 interspaces. No definite edema within the intervening discs themselves. Findings are favored to be  discogenic and reactive in nature, although changes of early osteomyelitis discitis are difficult to exclude, and could be considered in the correct clinical setting. No other evidence for acute infection within the thoracic spine. No worrisome osseous lesions. Cord: Patchy signal abnormality seen involving the cervical spinal cord at approximately the level of C3-4, likely reflecting chronic myelomalacia (series 15, image 16). Signal intensity within the visualized cord is otherwise within normal limits. Conus terminates at approximately the L1 level. No epidural collections. Paraspinal and other soft tissues: Paraspinous soft tissues demonstrate no acute finding. No definite paraspinous inflammatory changes seen about the T6-7 through T8-9 interspaces. No soft tissue collections. Small layering bilateral pleural effusions, left slightly larger than right. Disc levels: T1-2: Diffuse disc bulge with reactive endplate change, with left greater than right facet hypertrophy. No spinal stenosis. Mild to moderate bilateral foraminal narrowing. T2-3: Mild disc bulge with facet hypertrophy. No spinal stenosis. Foramina remain patent. T3-4: Small central disc extrusion with inferior migration. Mild facet hypertrophy. No significant spinal stenosis. Foramina remain patent. T4-5: Small right subarticular disc protrusion minimally indents the right ventral thecal sac. Associated mild reactive endplate spurring with bilateral facet hypertrophy. No spinal stenosis. Foramina remain patent. T5-6: Disc desiccation without significant disc bulge. Right greater than left facet hypertrophy. No spinal stenosis. Mild right foraminal narrowing. T6-7: Degenerative intervertebral disc space narrowing with diffuse disc osteophyte complex, eccentric to the left. Mild reactive marrow edema favored to be degenerative. Mild flattening of the left ventral cord without cord signal changes or significant spinal stenosis. Mild bilateral foraminal  narrowing. T7-8: Advanced degenerative intervertebral disc space narrowing with diffuse disc osteophyte complex. Mild reactive marrow edema favored to be degenerative. Bilateral facet hypertrophy. Resultant mild spinal stenosis without cord impingement. Moderate right with mild left foraminal narrowing. T8-9: Advanced degenerative intervertebral disc space narrowing with diffuse disc osteophyte complex. Bilateral facet hypertrophy. Resultant mild spinal stenosis without cord impingement. Moderate bilateral foraminal narrowing. T9-10: Advanced degenerative intervertebral disc space narrowing with essentially ankylosis of the T9 and T10 vertebral bodies. Posterior osseous ridging flattens and partially faces the ventral thecal sac. Bilateral facet hypertrophy. Resultant mild spinal stenosis without frank cord impingement. Mild right worse than left foraminal narrowing. T10-11: Advanced degenerative intervertebral disc space narrowing with ankylosis. Posterior endplate osseous ridging with flattening of the ventral thecal sac, eccentric to the left. Left worse than right facet degeneration. Mild spinal stenosis without cord impingement. Mild to moderate bilateral foraminal narrowing. T11-12: Advanced degenerative intervertebral disc space narrowing with ankylosis. Bilateral facet hypertrophy with ankylosis as well. No significant spinal stenosis. Mild to moderate right with mild left foraminal narrowing. T12-L1:  Negative interspace.  Mild facet hypertrophy.  No stenosis. IMPRESSION: 1. 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 Resultant mild diffuse spinal stenosis at T6-7 through T10-11, with associated mild to moderate multilevel foraminal narrowing as detailed above. 2. Mild marrow edema involving the endplates at Z8-5 through T8-9, favored to be degenerative in nature. Possible changes of early osteomyelitis  discitis are difficult to exclude, although are felt to be unlikely. If patient's symptoms should persist and/or worsen, a short interval follow-up MRI to evaluate for interval change may be helpful for further evaluation as  warranted. No other evidence for acute infection within the thoracic spine. 3. Patchy signal abnormality involving the cervical spinal cord at the level of C3-4, most consistent with chronic myelomalacia. 4. Small layering bilateral pleural effusions, left greater than right. Electronically Signed   By: Jeannine Boga M.D.   On: 01/21/2021 00:42   CT ABDOMEN PELVIS W CONTRAST  Addendum Date: 01/18/2021   ADDENDUM REPORT: 01/18/2021 00:36 ADDENDUM: 8 mm ground-glass opacity seen in the left lung apex. Possibly an acute infectious or inflammatory process though neoplasm is not fully excluded. Initial follow-up with CT at 6-12 months is recommended to confirm persistence. If persistent, repeat CT is recommended every 2 years until 5 years of stability has been established. This recommendation follows the consensus statement: Guidelines for Management of Incidental Pulmonary Nodules Detected on CT Images: From the Fleischner Society 2017; Radiology 2017; 284:228-243. Electronically Signed   By: Lovena Le M.D.   On: 01/18/2021 00:36   Result Date: 01/18/2021 CLINICAL DATA:  Chest discomfort, nausea and vomiting EXAM: CT ANGIOGRAPHY CHEST CT ABDOMEN AND PELVIS WITH CONTRAST TECHNIQUE: Multidetector CT imaging of the chest was performed using the standard protocol during bolus administration of intravenous contrast. Multiplanar CT image reconstructions and MIPs were obtained to evaluate the vascular anatomy. Multidetector CT imaging of the abdomen and pelvis was performed using the standard protocol during bolus administration of intravenous contrast. CONTRAST:  52mL OMNIPAQUE IOHEXOL 350 MG/ML SOLN COMPARISON:  Radiograph 01/17/2021 some ultrasound abdomen 04/21/2018 FINDINGS: CTA CHEST  FINDINGS Cardiovascular: Satisfactory opacification the pulmonary arteries to the segmental level. No pulmonary artery filling defects are identified. Central pulmonary arteries are normal caliber. Cardiomegaly. Small pericardial effusion. Suboptimal opacification of the thoracic aorta for luminal assessment. Atherosclerotic plaque within the normal caliber aorta. No gross acute aortic abnormality is evident. Normal 3 vessel branching of the aortic arch. Proximal great vessels are unremarkable. Mediastinum/Nodes: No mediastinal fluid or gas. Normal thyroid gland and thoracic inlet. No acute abnormality of the trachea or esophagus. No worrisome mediastinal, hilar or axillary adenopathy. Lungs/Pleura: Low lung volumes and mixed dependent and subsegmental atelectatic changes. 8 mm ground-glass opacity seen in left upper lobe (4/25). No other consolidative opacities in the lungs. No pneumothorax. No layering effusion. No other concerning nodules or masses. Musculoskeletal: No acute osseous abnormality or suspicious osseous lesion. Exaggerated thoracic kyphosis with multilevel bony fusion across the vertebral bodies and posterior elements noted T9-T12 as well as more pronounced spondylitic changes at the adjacent T7-T8, T8-T9 levels. Prior cervical fusion noted as well. No acute or worrisome osseous abnormality. Minimal degenerative changes of the shoulders. No worrisome chest wall mass or lesion. Diffuse body wall edema. Review of the MIP images confirms the above findings. CT ABDOMEN and PELVIS FINDINGS Hepatobiliary: No worrisome focal liver lesions. Smooth liver surface contour. Normal hepatic attenuation. Gallbladder is mildly distended with some layering hyperdense material, could reflect biliary sand or if patient has received additional contrast media in recent days, possible vicarious extravasation of contrast media. No visible intraductal gallstones or significant biliary ductal dilatation. Pancreas: Normal  tapering of the distal common bile duct. No pancreatic inflammation or discernible pancreatic lesion. Spleen: Normal in size. No concerning splenic lesions. Small accessory splenule posteriorly. Adrenals/Urinary Tract: Normal adrenals. Kidneys are normally located with symmetric enhancement and excretion. Fluid attenuation cyst seen in the upper pole right kidney measuring up to 2.2 cm in size. No suspicious renal lesion, urolithiasis or hydronephrosis. Urinary bladder is largely decompressed at the time of exam and therefore poorly  evaluated by CT imaging. No gross bladder abnormality accounting for underdistention. Stomach/Bowel: Distal esophagus unremarkable. Stomach and duodenum are free of acute abnormality with a normal duodenal sweep across the midline abdomen. No small bowel thickening or dilatation. No evidence of high-grade small bowel obstruction. No clear colonic thickening or dilatation accounting for varying degrees of underdistention. Appendix is not visualized. No focal inflammation the vicinity of the cecum to suggest an occult appendicitis. Vascular/Lymphatic: Atherosclerotic calcifications within the abdominal aorta and branch vessels. No aneurysm or ectasia. Small amount of fluid seen surrounding the origin of a right common femoral arteriovenous fistula graft (6/68). No pathologically enlarged abdominopelvic lymph nodes. Reproductive: Retroverted uterus with calcified uterine fibroids. No concerning adnexal mass. Other: Diffuse body wall edema. Mild edematous changes in the mesentery. No abdominopelvic free air or fluid. No bowel containing hernia. Postsurgical changes of the anterior abdominal wall including a small umbilical hernia into which protrudes portion of the anti mesenteric transverse colon (6/39) albeit without evidence of vascular compromise or obstruction at this level. Musculoskeletal: Diffuse osseous manifestations of renal osteodystrophy. Grade 2 anterolisthesis L4 on L5 without  spondylolysis. Multilevel discogenic and facet degenerative changes are maximal at the L4-5 level as well. Additional degenerative changes in the bilateral hips and additional arthrosis at the SI joints and symphysis pubis. Review of the MIP images confirms the above findings. IMPRESSION: 1. No evidence of pulmonary artery embolism. 2. Cardiomegaly and small pericardial effusion. 3. Diffuse body wall edema could reflect early volume overload/anasarca. 4. Distended gallbladder with layering hyperdensity, could reflect tiny biliary stones/sand or vicarious extravasation of contrast if patient has had a recent contrasted exam. While there is no pericholecystic fluid or inflammation, could correlate for right upper quadrant symptoms as isolated distension can be an early sign of developing cholecystitis. No biliary ductal dilatation or intraductal gallstones. 5. Small amount of fluid surrounding the anastomosis of a right femoral AV fistula in the right inguinal chain. Could reflect small chronic postoperative seroma though should correlate fistula function and local assessment of the inguinal region. 6. Small broad-based umbilical hernia into which protrudes portion of the anti mesenteric transverse colon without evidence of vascular compromise or obstruction. 7. Diffuse osseous manifestations of renal osteodystrophy. Electronically Signed: By: Lovena Le M.D. On: 01/18/2021 00:18   DG CHEST PORT 1 VIEW  Result Date: 01/23/2021 CLINICAL DATA:  Cough. EXAM: PORTABLE CHEST 1 VIEW COMPARISON:  Chest radiograph and CTA 01/17/2021 FINDINGS: The cardiac silhouette remains mildly enlarged. The lungs are mildly hypoinflated with mild linear opacity in the left greater than right lung bases. No overt pulmonary edema, sizable pleural effusion, or pneumothorax is identified. Prior cervical spine fusion is noted. IMPRESSION: Bibasilar atelectasis. Electronically Signed   By: Logan Bores M.D.   On: 01/23/2021 14:30    ECHOCARDIOGRAM COMPLETE  Result Date: 01/19/2021    ECHOCARDIOGRAM REPORT   Patient Name:   KATORIA YETMAN Date of Exam: 01/19/2021 Medical Rec #:  [id number]        Height:       60.0 in Accession #:    [id number]       Weight:       150.8 lb Date of Birth:  1950-09-05       BSA:          1.656 m Patient Age:    70 years         BP:           152/71 mmHg Patient Gender: F  HR:           88 bpm. Exam Location:  Inpatient Procedure: 2D Echo, Cardiac Doppler and Color Doppler Indications:    Bacteremia  History:        Patient has prior history of Echocardiogram examinations, most                 recent 10/15/2020. CHF, Signs/Symptoms:ESRD, anemia, ERSD; Risk                 Factors:Diabetes, Hypertension and Dyslipidemia.  Sonographer:    Dustin Flock Referring Phys: [id number] [place]  1. Left ventricular ejection fraction, by estimation, is 55%. The left ventricle has normal function. The left ventricle has no regional wall motion abnormalities. There is mild left ventricular hypertrophy. Left ventricular diastolic parameters are consistent with Grade I diastolic dysfunction (impaired relaxation).  2. Peak RV-RA gradient 22 mmHg. Right ventricular systolic function is mildly reduced. The right ventricular size is normal.  3. The mitral valve is normal in structure. Trivial mitral valve regurgitation. No evidence of mitral stenosis.  4. The aortic valve is tricuspid. Aortic valve regurgitation is not visualized. No aortic stenosis is present.  5. The IVC was not visualized.  6. Technically difficult study with poor acoustic windows. FINDINGS  Left Ventricle: Left ventricular ejection fraction, by estimation, is 55%. The left ventricle has normal function. The left ventricle has no regional wall motion abnormalities. The left ventricular internal cavity size was normal in size. There is mild left ventricular hypertrophy. Left ventricular diastolic parameters are consistent with  Grade I diastolic dysfunction (impaired relaxation). Right Ventricle: Peak RV-RA gradient 22 mmHg. The right ventricular size is normal. No increase in right ventricular wall thickness. Right ventricular systolic function is mildly reduced. Left Atrium: Left atrial size was normal in size. Right Atrium: Right atrial size was normal in size. Pericardium: There is no evidence of pericardial effusion. Mitral Valve: The mitral valve is normal in structure. Trivial mitral valve regurgitation. No evidence of mitral valve stenosis. Tricuspid Valve: The tricuspid valve is normal in structure. Tricuspid valve regurgitation is trivial. Aortic Valve: The aortic valve is tricuspid. Aortic valve regurgitation is not visualized. No aortic stenosis is present. Aortic valve peak gradient measures 10.8 mmHg. Pulmonic Valve: The pulmonic valve was not well visualized. Pulmonic valve regurgitation is not visualized. Aorta: The aortic root is normal in size and structure. Venous: The inferior vena cava was not well visualized. IAS/Shunts: No atrial level shunt detected by color flow Doppler.  LEFT VENTRICLE PLAX 2D LVIDd:         3.80 cm  Diastology LVIDs:         2.90 cm  LV e' medial:    3.26 cm/s LV PW:         1.00 cm  LV E/e' medial:  29.3 LV IVS:        1.30 cm  LV e' lateral:   4.68 cm/s LVOT diam:     2.00 cm  LV E/e' lateral: 20.4 LV SV:         71 LV SV Index:   43 LVOT Area:     3.14 cm  RIGHT VENTRICLE RV Basal diam:  3.30 cm RV S prime:     9.90 cm/s TAPSE (M-mode): 2.3 cm LEFT ATRIUM           Index       RIGHT ATRIUM           Index LA  diam:      4.00 cm 2.42 cm/m  RA Area:     16.90 cm LA Vol (A4C): 55.4 ml 33.46 ml/m RA Volume:   41.10 ml  24.83 ml/m  AORTIC VALVE AV Area (Vmax): 2.16 cm AV Vmax:        164.00 cm/s AV Peak Grad:   10.8 mmHg LVOT Vmax:      113.00 cm/s LVOT Vmean:     77.300 cm/s LVOT VTI:       0.225 m  AORTA Ao Root diam: 3.00 cm MITRAL VALVE                TRICUSPID VALVE MV Area (PHT): 6.32  cm     TR Peak grad:   22.1 mmHg MV Decel Time: 120 msec     TR Vmax:        235.00 cm/s MV E velocity: 95.40 cm/s MV A velocity: 136.00 cm/s  SHUNTS MV E/A ratio:  0.70         Systemic VTI:  0.22 m                             Systemic Diam: 2.00 cm Loralie Champagne MD Electronically signed by Loralie Champagne MD Signature Date/Time: 01/19/2021/5:51:51 PM    Final    ECHO TEE  Result Date: 01/22/2021    TRANSESOPHOGEAL ECHO REPORT   Patient Name:   LATICHA FERRUCCI Date of Exam: 01/22/2021 Medical Rec #:  [id number]        Height:       60.0 in Accession #:    [id number]       Weight:       154.1 lb Date of Birth:  05-06-1951       BSA:          1.671 m Patient Age:    51 years         BP:           156/75 mmHg Patient Gender: F                HR:           96 bpm. Exam Location:  Inpatient Procedure: Transesophageal Echo and Cardiac Doppler Indications:     Bacteremia  History:         Patient has prior history of Echocardiogram examinations, most                  recent 01/19/2021. CHF, Pulmonary HTN; Risk                  Factors:Hypertension, Diabetes, Dyslipidemia and Non-Smoker.                  CKD.  Sonographer:     Vickie Epley RDCS Referring Phys:  [id number] Abigail Butts Diagnosing Phys: Skeet Latch MD PROCEDURE: The transesophogeal probe was passed without difficulty through the esophogus of the patient. Sedation performed by different physician. The patient was monitored while under deep sedation. Anesthestetic sedation was provided intravenously by Anesthesiology: 201.12mg  of Propofol. The patient's vital signs; including heart rate, blood pressure, and oxygen saturation; remained stable throughout the procedure. The patient developed no complications during the procedure. IMPRESSIONS  1. Left ventricular ejection fraction, by estimation, is 60 to 65%. The left ventricle has normal function. The left ventricle has no regional wall motion abnormalities.  2. Right ventricular systolic function is  normal. The  right ventricular size is normal.  3. No left atrial/left atrial appendage thrombus was detected.  4. The mitral valve is normal in structure. Trivial mitral valve regurgitation. No evidence of mitral stenosis.  5. The aortic valve is tricuspid. Aortic valve regurgitation is trivial. No aortic stenosis is present.  6. The inferior vena cava is normal in size with greater than 50% respiratory variability, suggesting right atrial pressure of 3 mmHg. Conclusion(s)/Recommendation(s): Normal biventricular function without evidence of hemodynamically significant valvular heart disease. No evidence of vegetation/infective endocarditis on this transesophageal echocardiogram. FINDINGS  Left Ventricle: Left ventricular ejection fraction, by estimation, is 60 to 65%. The left ventricle has normal function. The left ventricle has no regional wall motion abnormalities. The left ventricular internal cavity size was normal in size. There is  no left ventricular hypertrophy. Right Ventricle: The right ventricular size is normal. No increase in right ventricular wall thickness. Right ventricular systolic function is normal. Left Atrium: Left atrial size was normal in size. No left atrial/left atrial appendage thrombus was detected. Right Atrium: Right atrial size was normal in size. Pericardium: Trivial pericardial effusion is present. Mitral Valve: The mitral valve is normal in structure. Trivial mitral valve regurgitation. No evidence of mitral valve stenosis. Tricuspid Valve: The tricuspid valve is normal in structure. Tricuspid valve regurgitation is not demonstrated. No evidence of tricuspid stenosis. Aortic Valve: The aortic valve is tricuspid. Aortic valve regurgitation is trivial. No aortic stenosis is present. Pulmonic Valve: The pulmonic valve was normal in structure. Pulmonic valve regurgitation is not visualized. No evidence of pulmonic stenosis. Aorta: The aortic root is normal in size and structure. Venous:  The inferior vena cava is normal in size with greater than 50% respiratory variability, suggesting right atrial pressure of 3 mmHg. IAS/Shunts: No atrial level shunt detected by color flow Doppler. Skeet Latch MD Electronically signed by Skeet Latch MD Signature Date/Time: 01/22/2021/4:08:17 PM    Final    US Abdomen Limited RUQ (LIVER/GB)  Result Date: 02/10/2021 CLINICAL DATA:  Hyperbilirubinemia EXAM: ULTRASOUND ABDOMEN LIMITED RIGHT UPPER QUADRANT COMPARISON:  CT 02/10/2021, 01/17/2021, ultrasound 01/18/2021 FINDINGS: Gallbladder: Echogenic material within the gallbladder lumen with dense shadowing similar compared to prior. Negative sonographic Murphy. Normal wall thickness. Common bile duct: Diameter: 4 mm Liver: No focal lesion identified. Within normal limits in parenchymal echogenicity. Incidental note made of right pleural effusion Other: None. IMPRESSION: 1. Similar echodense shadowing material in the gallbladder lumen suggestive of sandlike stones. Negative for acute cholecystitis. 2. No biliary dilatation 3. Right pleural effusion Electronically Signed   By: Donavan Foil M.D.   On: 02/10/2021 18:14   US Abdomen Limited RUQ (LIVER/GB)  Result Date: 01/18/2021 CLINICAL DATA:  Hyperbilirubinemia with gallbladder debris seen on recent CT. EXAM: ULTRASOUND ABDOMEN LIMITED RIGHT UPPER QUADRANT COMPARISON:  April 21, 2018 FINDINGS: Gallbladder: A layer of shadowing, markedly echogenic material is seen within the dependent portion of a markedly distended gallbladder lumen (11.2 cm in length). This corresponds to the findings seen on the prior abdomen pelvis CT. No gallbladder thickening is visualized (2.3 mm). No sonographic Murphy sign noted by sonographer. Common bile duct: Diameter: 3.7 mm Liver: No focal lesion identified. The liver parenchyma is mildly nodular in contour and within normal limits in parenchymal echogenicity. Portal vein is patent on color Doppler imaging with normal  direction of blood flow towards the liver. Other: The study is limited secondary to overlying bowel gas and rapid patient breathing. IMPRESSION: 1. Echogenic material within the gallbladder lumen which may represent  milk of calcium versus retained contrast from a recent study. 2. No acute cholecystitis. Electronically Signed   By: Virgina Norfolk M.D.   On: 01/18/2021 03:48   (Echo, Carotid, EGD, Colonoscopy, ERCP)    Subjective: Patient seen and examined.  No overnight events.  Feels okay.  Still on 1 to 2 L of oxygen but mostly for comfort.  Denies any nausea vomiting.  Abdomen pain has improved.   Discharge Exam: Vitals:   02/12/21 0836 02/12/21 1133  BP: (!) 158/86 (!) 153/86  Pulse: 100 (!) 103  Resp: 20 18  Temp: 98.8 F (37.1 C) 98 F (36.7 C)  SpO2: 100% 98%   Vitals:   02/12/21 0320 02/12/21 0450 02/12/21 0836 02/12/21 1133  BP:  (!) 141/79 (!) 158/86 (!) 153/86  Pulse:  98 100 (!) 103  Resp:  16 20 18   Temp:  98.9 F (37.2 C) 98.8 F (37.1 C) 98 F (36.7 C)  TempSrc:  Oral Oral Oral  SpO2:  100% 100% 98%  Weight: 64.4 kg     Height:        General: Pt is alert, awake, not in acute distress Sitting at the edge of the bed and eating breakfast. Cardiovascular: RRR, S1/S2 +, no rubs, no gallops Respiratory: CTA bilaterally, no wheezing, no rhonchi Abdominal: Soft, NT, ND, bowel sounds + Extremities: no edema, no cyanosis Right thigh AV fistula.    The results of significant diagnostics from this hospitalization (including imaging, microbiology, ancillary and laboratory) are listed below for reference.     Microbiology: Recent Results (from the past 240 hour(s))  Resp Panel by RT-PCR (Flu A&B, Covid) Nasopharyngeal Swab     Status: None   Collection Time: 02/10/21  4:17 PM   Specimen: Nasopharyngeal Swab; Nasopharyngeal(NP) swabs in vial transport medium  Result Value Ref Range Status   SARS Coronavirus 2 by RT PCR NEGATIVE NEGATIVE Final    Comment:  (NOTE) SARS-CoV-2 target nucleic acids are NOT DETECTED.  The SARS-CoV-2 RNA is generally detectable in upper respiratory specimens during the acute phase of infection. The lowest concentration of SARS-CoV-2 viral copies this assay can detect is 138 copies/mL. A negative result does not preclude SARS-Cov-2 infection and should not be used as the sole basis for treatment or other patient management decisions. A negative result may occur with  improper specimen collection/handling, submission of specimen other than nasopharyngeal swab, presence of viral mutation(s) within the areas targeted by this assay, and inadequate number of viral copies(<138 copies/mL). A negative result must be combined with clinical observations, patient history, and epidemiological information. The expected result is Negative.  Fact Sheet for Patients:  EntrepreneurPulse.com.au  Fact Sheet for Healthcare Providers:  IncredibleEmployment.be  This test is no t yet approved or cleared by the [place] FDA and  has been authorized for detection and/or diagnosis of SARS-CoV-2 by FDA under an Emergency Use Authorization (EUA). This EUA will remain  in effect (meaning this test can be used) for the duration of the COVID-19 declaration under Section 564(b)(1) of the Act, 21 U.S.C.section 360bbb-3(b)(1), unless the authorization is terminated  or revoked sooner.       Influenza A by PCR NEGATIVE NEGATIVE Final   Influenza B by PCR NEGATIVE NEGATIVE Final    Comment: (NOTE) The Xpert Xpress SARS-CoV-2/FLU/RSV plus assay is intended as an aid in the diagnosis of influenza from Nasopharyngeal swab specimens and should not be used as a sole basis for treatment. Nasal washings and aspirates are unacceptable  for Xpert Xpress SARS-CoV-2/FLU/RSV testing.  Fact Sheet for Patients: EntrepreneurPulse.com.au  Fact Sheet for Healthcare  Providers: IncredibleEmployment.be  This test is not yet approved or cleared by the [place] FDA and has been authorized for detection and/or diagnosis of SARS-CoV-2 by FDA under an Emergency Use Authorization (EUA). This EUA will remain in effect (meaning this test can be used) for the duration of the COVID-19 declaration under Section 564(b)(1) of the Act, 21 U.S.C. section 360bbb-3(b)(1), unless the authorization is terminated or revoked.  Performed at [facility], [place] [address], [place], [place] [address]   Culture, blood (single)     Status: None (Preliminary result)   Collection Time: 02/10/21  5:52 PM   Specimen: BLOOD  Result Value Ref Range Status   Specimen Description BLOOD LEFT ANTECUBITAL  Final   Special Requests   Final    BOTTLES DRAWN AEROBIC AND ANAEROBIC Blood Culture results may not be optimal due to an excessive volume of blood received in culture bottles   Culture   Final    NO GROWTH 2 DAYS Performed at [facility], [place] [address], [place], [place] [address]    Report Status PENDING  Incomplete     Labs: BNP (last 3 results) Recent Labs    02/10/21 1119  BNP 7,425.9*   Basic Metabolic Panel: Recent Labs  Lab 02/10/21 1119 02/11/21 0312 02/12/21 0046  NA 137 134* 134*  K 4.7 3.6 3.9  CL 93* 94* 93*  CO2 23 24 28   GLUCOSE 199* 117* 248*  BUN 37* 11 35*  CREATININE 7.34* 3.48* 6.19*  CALCIUM 9.7 9.0 8.8*  PHOS  --  2.7 4.3   Liver Function Tests: Recent Labs  Lab 02/10/21 1119 02/11/21 0312 02/12/21 0046  AST 20 20  --   ALT 15 13  --   ALKPHOS 173* 154*  --   BILITOT 1.7* 2.3*  --   PROT 7.0 6.4*  --   ALBUMIN 3.6 3.2*  3.2* 3.0*   Recent Labs  Lab 02/11/21 0312  LIPASE 37   No results for input(s): AMMONIA in the last 168 hours. CBC: Recent Labs  Lab 02/10/21 1119  WBC 7.3  NEUTROABS 5.4  HGB 10.3*  HCT 32.9*  MCV 86.8  PLT 245   Cardiac Enzymes: No results for input(s):  CKTOTAL, CKMB, CKMBINDEX, TROPONINI in the last 168 hours. BNP: Invalid input(s): POCBNP CBG: Recent Labs  Lab 02/11/21 1205 02/11/21 1650 02/11/21 2156 02/12/21 0553 02/12/21 1134  GLUCAP 183* 225* 226* 221* 231*   D-Dimer No results for input(s): DDIMER in the last 72 hours. Hgb A1c No results for input(s): HGBA1C in the last 72 hours. Lipid Profile No results for input(s): CHOL, HDL, LDLCALC, TRIG, CHOLHDL, LDLDIRECT in the last 72 hours. Thyroid function studies No results for input(s): TSH, T4TOTAL, T3FREE, THYROIDAB in the last 72 hours.  Invalid input(s): FREET3 Anemia work up No results for input(s): VITAMINB12, FOLATE, FERRITIN, TIBC, IRON, RETICCTPCT in the last 72 hours. Urinalysis    Component Value Date/Time   COLORURINE YELLOW 02/11/2018 1646   APPEARANCEUR CLOUDY (A) 02/11/2018 1646   LABSPEC 1.005 02/11/2018 1646   PHURINE 5.0 02/11/2018 1646   GLUCOSEU NEGATIVE 02/11/2018 1646   HGBUR LARGE (A) 02/11/2018 1646   BILIRUBINUR NEGATIVE 02/11/2018 1646   KETONESUR NEGATIVE 02/11/2018 1646   PROTEINUR NEGATIVE 02/11/2018 1646   NITRITE NEGATIVE 02/11/2018 1646   LEUKOCYTESUR LARGE (A) 02/11/2018 1646   Sepsis Labs Invalid input(s):  PROCALCITONIN,  WBC,  LACTICIDVEN Microbiology Recent Results (from the past 240 hour(s))  Resp Panel by RT-PCR (Flu A&B, Covid) Nasopharyngeal Swab     Status: None   Collection Time: 02/10/21  4:17 PM   Specimen: Nasopharyngeal Swab; Nasopharyngeal(NP) swabs in vial transport medium  Result Value Ref Range Status   SARS Coronavirus 2 by RT PCR NEGATIVE NEGATIVE Final    Comment: (NOTE) SARS-CoV-2 target nucleic acids are NOT DETECTED.  The SARS-CoV-2 RNA is generally detectable in upper respiratory specimens during the acute phase of infection. The lowest concentration of SARS-CoV-2 viral copies this assay can detect is 138 copies/mL. A negative result does not preclude SARS-Cov-2 infection and should not be used as the  sole basis for treatment or other patient management decisions. A negative result may occur with  improper specimen collection/handling, submission of specimen other than nasopharyngeal swab, presence of viral mutation(s) within the areas targeted by this assay, and inadequate number of viral copies(<138 copies/mL). A negative result must be combined with clinical observations, patient history, and epidemiological information. The expected result is Negative.  Fact Sheet for Patients:  EntrepreneurPulse.com.au  Fact Sheet for Healthcare Providers:  IncredibleEmployment.be  This test is no t yet approved or cleared by the [place] FDA and  has been authorized for detection and/or diagnosis of SARS-CoV-2 by FDA under an Emergency Use Authorization (EUA). This EUA will remain  in effect (meaning this test can be used) for the duration of the COVID-19 declaration under Section 564(b)(1) of the Act, 21 U.S.C.section 360bbb-3(b)(1), unless the authorization is terminated  or revoked sooner.       Influenza A by PCR NEGATIVE NEGATIVE Final   Influenza B by PCR NEGATIVE NEGATIVE Final    Comment: (NOTE) The Xpert Xpress SARS-CoV-2/FLU/RSV plus assay is intended as an aid in the diagnosis of influenza from Nasopharyngeal swab specimens and should not be used as a sole basis for treatment. Nasal washings and aspirates are unacceptable for Xpert Xpress SARS-CoV-2/FLU/RSV testing.  Fact Sheet for Patients: EntrepreneurPulse.com.au  Fact Sheet for Healthcare Providers: IncredibleEmployment.be  This test is not yet approved or cleared by the [place] FDA and has been authorized for detection and/or diagnosis of SARS-CoV-2 by FDA under an Emergency Use Authorization (EUA). This EUA will remain in effect (meaning this test can be used) for the duration of the COVID-19 declaration under Section 564(b)(1) of the  Act, 21 U.S.C. section 360bbb-3(b)(1), unless the authorization is terminated or revoked.  Performed at [facility], [place] [address], [place], [place] [address]   Culture, blood (single)     Status: None (Preliminary result)   Collection Time: 02/10/21  5:52 PM   Specimen: BLOOD  Result Value Ref Range Status   Specimen Description BLOOD LEFT ANTECUBITAL  Final   Special Requests   Final    BOTTLES DRAWN AEROBIC AND ANAEROBIC Blood Culture results may not be optimal due to an excessive volume of blood received in culture bottles   Culture   Final    NO GROWTH 2 DAYS Performed at [facility], [place] [address], [place], [place] [address]    Report Status PENDING  Incomplete     Time coordinating discharge:  32 minutes  SIGNED:   Barb Merino, MD  [facility] 02/12/2021, 1:28 PM

## 2021-02-12 NOTE — TOC Transition Note (Signed)
Transition of Care [facility]) - CM/SW Discharge Note   Patient Details  Name: Belinda [name] MRN: [id number] Date of Birth: 1950-08-03  Transition of Care [facility]) CM/SW Contact:  Zenon Mayo, RN Phone Number: 02/12/2021, 5:23 PM   Clinical Narrative:    Patient is for dc today,  she states she would like to continue with [facility] for HHPT.  Ramond Marrow is aware.  She has no other needs.    Final next level of care: [place] Barriers to Discharge: No Barriers Identified   Patient Goals and CMS Choice Patient states their goals for this hospitalization and ongoing recovery are:: return home with [facility] CMS Medicare.gov Compare Post Acute Care list provided to:: Patient Choice offered to / list presented to : Patient  Discharge Placement                       Discharge Plan and Services                  DME Agency: NA       HH Arranged: PT [place] Agency: [place] ([facility]) Date HH Agency Contacted: 02/12/21 Time [place]: 1722 Representative spoke with at [place]: [place] ([place]) Interventions     Readmission Risk Interventions No flowsheet data found.

## 2021-02-12 NOTE — Plan of Care (Signed)
  Problem: Activity: Goal: Capacity to carry out activities will improve Outcome: Progressing   Problem: Cardiac: Goal: Ability to achieve and maintain adequate cardiopulmonary perfusion will improve Outcome: Progressing   

## 2021-02-13 ENCOUNTER — Encounter: Payer: Self-pay | Admitting: Internal Medicine

## 2021-02-13 ENCOUNTER — Inpatient Hospital Stay: Payer: Medicare Other | Admitting: Internal Medicine

## 2021-02-13 ENCOUNTER — Telehealth: Payer: Self-pay | Admitting: Nurse Practitioner

## 2021-02-13 ENCOUNTER — Telehealth: Payer: Self-pay | Admitting: *Deleted

## 2021-02-13 ENCOUNTER — Ambulatory Visit (INDEPENDENT_AMBULATORY_CARE_PROVIDER_SITE_OTHER): Payer: Medicare Other | Admitting: Internal Medicine

## 2021-02-13 ENCOUNTER — Other Ambulatory Visit: Payer: Self-pay

## 2021-02-13 VITALS — BP 162/88 | HR 119 | Temp 97.7°F | Wt 141.0 lb

## 2021-02-13 DIAGNOSIS — R7881 Bacteremia: Secondary | ICD-10-CM | POA: Diagnosis not present

## 2021-02-13 NOTE — Telephone Encounter (Signed)
Transition of care contact from inpatient facility  Date of Discharge: 02/12/2021 Date of Contact: 02/13/2021 Method of contact: Phone  Attempted to contact patient to discuss transition of care from inpatient admission. Patient did not answer the phone. Message was left on the patient's voicemail with call back number [phone].

## 2021-02-13 NOTE — Telephone Encounter (Signed)
Dr.Armbruster and Osvaldo Angst,  Patient is for a recall colon on 03/13/21 hx colon polyps. Patient has had 2 recent hospital admissions for CHF, ESRD-dialysis, sepsis. Please review. Ok for colonoscopy at [facility] at this time?  Please advise. Thank you, Kalaya Infantino pv

## 2021-02-13 NOTE — Telephone Encounter (Signed)
Called patient and gave her Dr.Armbruster's recommendations. Made OV 1st available and cx colon & PV-patient is aware.

## 2021-02-13 NOTE — Telephone Encounter (Signed)
Belinda [name] it would be best for me to see this patient in the clinic first, assess how she is doing post hospitalization, and can discuss if / when / where she does colonoscopy at that time. Thanks

## 2021-02-13 NOTE — Patient Instructions (Signed)
Your infection seems treated   Watch for recurrence of fever, with persistent focal joint pain. If this occurs, please call our clinic

## 2021-02-13 NOTE — Progress Notes (Signed)
[place] for Infectious Disease  Patient Active Problem List   Diagnosis Date Noted   Fluid overload 02/10/2021   GERD (gastroesophageal reflux disease) 02/10/2021   Acute respiratory failure with hypoxia (HCC) 02/10/2021   Bacteremia 01/20/2021   Sepsis due to undetermined organism ([place]) 01/18/2021   ESRD on hemodialysis ([place]) 01/18/2021   Anemia due to end stage renal disease ([place]) 01/18/2021   Abnormal liver function tests 01/18/2021   Hypoxia 01/18/2021   Renal failure (ARF), acute on chronic (HCC)    Hyperglycemia    Pressure injury of skin 02/04/2018   Acute CHF (congestive heart failure) ([place]) 02/02/2018   DM (diabetes mellitus), type 2 with renal complications ([place]) 23/76/2831   Bilateral lower extremity edema 51/76/1607   Chronic systolic (congestive) heart failure ([place]) 37/03/6268   Acute systolic (congestive) heart failure ([place]) 05/30/2017   Essential hypertension 05/30/2017   Congestive heart failure ([place]) 04/26/2017   Chronic kidney disease 04/26/2017   DJD (degenerative joint disease) of knee 02/12/2015   Frequent PVCs 11/12/2014   Cervical spondylosis with myelopathy and radiculopathy 01/02/2014   Cervical spondylosis with myelopathy 01/01/2014   Wound check, abscess 06/14/2013   Cellulitis and abscess of buttock - right 06/12/2013   Dyspnea on exertion 03/22/2011   Chest pain 03/22/2011   Diabetes mellitus    Back pain    Palpitations    Anemia    Hx MRSA infection    Osteoarthritis       Subjective:    Patient ID: Belinda [name], female    DOB: [birth year], 70 y.o.   MRN: [id number]  Chief Complaint  Patient presents with   Hospitalization Follow-up    HPI:  Belinda [name] is a 70 y.o. female esrd, here for hospital f/u for granulicatella adjacens bacteremia  Patient seen in consultation: Dental appointment 3 weeks prior to admission; likely source Tee negative Has some thoracic back pain but mri equivocal. Presumed  noninfectious in setting negative tee, early/prompt assessment, and bacterial species non-staph aureus  Patient released from hospital 8/18 She finished 10 day iv abx on 8/06  She is feeling well No n/v/diarrhea/rash No f/c No worsening back pain   Allergies  Allergen Reactions   Doxycycline Other (See Comments)   Entresto [Sacubitril-Valsartan] Itching   Metformin Other (See Comments)    weakness   Omnicef [Cefdinir] Other (See Comments)    GI Upset   Aspirin Other (See Comments)    GI issues, Can tolerate low aspirin   Augmentin [Amoxicillin-Pot Clavulanate] Nausea And Vomiting and Other (See Comments)    GI issues DID THE REACTION INVOLVE: Swelling of the face/tongue/throat, SOB, or low BP? No Sudden or severe rash/hives, skin peeling, or the inside of the mouth or nose? No Did it require medical treatment? No When did it last happen?      long time  If all above answers are "NO", may proceed with cephalosporin use.    Erythromycin Nausea And Vomiting and Other (See Comments)    Gi issues   Gabapentin Swelling   Nickel Itching and Rash    Breakouts       Outpatient Medications Prior to Visit  Medication Sig Dispense Refill   acetaminophen (TYLENOL) 650 MG CR tablet Take 650-1,300 mg by mouth every 8 (eight) hours as needed for pain (pain).      Alpha-D-Galactosidase (BEANO PO) Take 1-2 tablets by mouth daily as needed (gas). With certain foods.     ALPRAZolam (  XANAX) 0.5 MG tablet Take 0.25-0.5 mg by mouth 2 (two) times daily as needed for anxiety.     aspirin EC 81 MG tablet Take 81 mg by mouth every Monday, Wednesday, and Friday. Swallow whole.     Azelastine HCl 0.15 % SOLN Place 2 sprays into both nostrils daily.     budesonide-formoterol (SYMBICORT) 160-4.5 MCG/ACT inhaler Inhale 2 puffs into the lungs 2 (two) times daily as needed (cough/respiratory issues.).      diclofenac sodium (VOLTAREN) 1 % GEL Apply 1 application topically 4 (four) times daily as needed  (back and knee).     ezetimibe-simvastatin (VYTORIN) 10-40 MG per tablet Take 1 tablet by mouth at bedtime.      ferric citrate (AURYXIA) 1 GM 210 MG(Fe) tablet Take 210-420 mg by mouth See admin instructions. Take 2 tablets (420 mg) by mouth 3 times daily with meals & take 1 tablet (210 mg) by mouth with each snack     fexofenadine (ALLEGRA) 180 MG tablet Take 180 mg by mouth daily.     guaiFENesin (MUCINEX) 600 MG 12 hr tablet Take 600 mg by mouth in the morning and at bedtime.      insulin lispro (HUMALOG) 100 UNIT/ML injection Inject 4-10 Units into the skin 3 (three) times daily before meals. Sliding Scale     LANTUS SOLOSTAR 100 UNIT/ML Solostar Pen Inject 18 Units into the skin at bedtime.   3   losartan (COZAAR) 25 MG tablet Take 12.5 mg by mouth daily.     Magnesium 200 MG TABS Take 0.5 tablets (100 mg total) by mouth 2 (two) times daily.     midodrine (PROAMATINE) 10 MG tablet Take 10 mg by mouth every Tuesday, Thursday, and Saturday at 6 PM. Pre dialysis     multivitamin (RENA-VIT) TABS tablet Take 1 tablet by mouth daily with breakfast.      nitroGLYCERIN (NITROSTAT) 0.4 MG SL tablet Place 1 tablet (0.4 mg total) under the tongue every 5 (five) minutes x 3 doses as needed for chest pain. 6 tablet 1   olopatadine (PATANOL) 0.1 % ophthalmic solution Place 1 drop into both eyes daily as needed (dry itchy eyes).     simethicone (MYLICON) 80 MG chewable tablet Chew 160 mg by mouth daily as needed for flatulence (indigestion.).      VITAMIN D, CHOLECALCIFEROL, PO Take 1 tablet by mouth Every Tuesday,Thursday,and Saturday with dialysis.     No facility-administered medications prior to visit.     Social History   Socioeconomic History   Marital status: Married    Spouse name: Not on file   Number of children: 1   Years of education: Not on file   Highest education level: Not on file  Occupational History   Not on file  Tobacco Use   Smoking status: Never   Smokeless tobacco:  Never  Vaping Use   Vaping Use: Never used  Substance and Sexual Activity   Alcohol use: Never   Drug use: Never   Sexual activity: Not Currently  Other Topics Concern   Not on file  Social History Narrative   Not on file   Social Determinants of Health   Financial Resource Strain: Not on file  Food Insecurity: Not on file  Transportation Needs: Not on file  Physical Activity: Not on file  Stress: Not on file  Social Connections: Not on file  Intimate Partner Violence: Not on file      Review of Systems  Negative other ros unless mentioned above  Objective:    Wt 141 lb (64 kg)   BMI 27.54 kg/m  Nursing note and vital signs reviewed.  Physical Exam     General/constitutional: no distress, pleasant HEENT: Normocephalic, PER, Conj Clear, EOMI, Oropharynx clear Neck supple CV: rrr no mrg Lungs: clear to auscultation, normal respiratory effort Abd: Soft, Nontender Ext: no edema Skin: No Rash Neuro: nonfocal MSK: no peripheral joint swelling/tenderness/warmth; back spines nontender    Labs: Lab Results  Component Value Date   WBC 7.3 02/10/2021   HGB 10.3 (L) 02/10/2021   HCT 32.9 (L) 02/10/2021   MCV 86.8 02/10/2021   PLT 245 17/40/8144   Last metabolic panel Lab Results  Component Value Date   GLUCOSE 248 (H) 02/12/2021   NA 134 (L) 02/12/2021   K 3.9 02/12/2021   CL 93 (L) 02/12/2021   CO2 28 02/12/2021   BUN 35 (H) 02/12/2021   CREATININE 6.19 (H) 02/12/2021   GFRNONAA 7 (L) 02/12/2021   GFRAA 4 (L) 10/12/2019   CALCIUM 8.8 (L) 02/12/2021   PHOS 4.3 02/12/2021   PROT 6.4 (L) 02/11/2021   ALBUMIN 3.0 (L) 02/12/2021   LABGLOB 2.5 02/07/2018   AGRATIO 2.1 04/26/2017   BILITOT 2.3 (H) 02/11/2021   ALKPHOS 154 (H) 02/11/2021   AST 20 02/11/2021   ALT 13 02/11/2021   ANIONGAP 13 02/12/2021    Micro:  Serology:  Imaging: 7/28 tee No endocartditis   7/26 mri thoracic spine no contrast 1. Extensive multilevel degenerative  spondylosis and facet arthrosis throughout the thoracic spine, most pronounced within the mid-thoracic spine. Resultant mild diffuse spinal stenosis at T6-7 through T10-11, with associated mild to moderate multilevel foraminal narrowing as detailed above. 2. Mild marrow edema involving the endplates at Y1-8 through T8-9, favored to be degenerative in nature. Possible changes of early osteomyelitis discitis are difficult to exclude, although are felt to be unlikely. If patient's symptoms should persist and/or worsen, a short interval follow-up MRI to evaluate for interval change may be helpful for further evaluation as warranted. No other evidence for acute infection within the thoracic spine. 3. Patchy signal abnormality involving the cervical spinal cord at the level of C3-4, most consistent with chronic myelomalacia. 4. Small layering bilateral pleural effusions, left greater than right.   7/25 tte  1. Left ventricular ejection fraction, by estimation, is 55%. The left  ventricle has normal function. The left ventricle has no regional wall  motion abnormalities. There is mild left ventricular hypertrophy. Left  ventricular diastolic parameters are  consistent with Grade I diastolic dysfunction (impaired relaxation).   2. Peak RV-RA gradient 22 mmHg. Right ventricular systolic function is  mildly reduced. The right ventricular size is normal.   3. The mitral valve is normal in structure. Trivial mitral valve  regurgitation. No evidence of mitral stenosis.   4. The aortic valve is tricuspid. Aortic valve regurgitation is not  visualized. No aortic stenosis is present.   5. The IVC was not visualized.   6. Technically difficult study with poor acoustic windows.    7/24 ct chest abd pelv with angio 1. No evidence of pulmonary artery embolism. 2. Cardiomegaly and small pericardial effusion. 3. Diffuse body wall edema could reflect early volume overload/anasarca. 4. Distended  gallbladder with layering hyperdensity, could reflect tiny biliary stones/sand or vicarious extravasation of contrast if patient has had a recent contrasted exam. While there is no pericholecystic fluid or inflammation, could correlate for right upper quadrant symptoms  as isolated distension can be an early sign of developing cholecystitis. No biliary ductal dilatation or intraductal gallstones. 5. Small amount of fluid surrounding the anastomosis of a right femoral AV fistula in the right inguinal chain. Could reflect small chronic postoperative seroma though should correlate fistula function and local assessment of the inguinal region. 6. Small broad-based umbilical hernia into which protrudes portion of the anti mesenteric transverse colon without evidence of vascular compromise or obstruction. 7. Diffuse osseous manifestations of renal osteodystrophy.   Assessment & Plan:   Problem List Items Addressed This Visit       Other   Bacteremia - Primary      No orders of the defined types were placed in this encounter.    Abx: 7/24-c vanc   7/24-25 cefepime/flagyl                                                               Assessment: Granulicatella adiacens bsi Esrd RLE graft Chf   70 yo female dm2, left TKA h x, esrd on iHD via RLE AV graft, chronic cervical/lumbar back pain admitted from dialysis center for sepsis and hypoxemic respiratory distress found to have granulicatella bsi in setting of 1 week malaise 2 weeks after a dental procedure     Clinicaly presentation concerning for IE. Granulicatella is a nutrition-variant strep that often requires treatment like enterococcus endocarditis in terms of beta-lactam aminoglycoside. It is often susceptible to vanc and less so with ceftriaxone. Sensitivity testing is difficult   Has n/v/diarrhea ?related to sepsis/missing dialysis. Doubt other viral process/tick process. Thrombocytopenia and mild lft probably due to  sepsis     She dialyzed via chronic right LE avg. The graft doesn't appear infected at this time She complained of the last 3 weeks very severe thoracic pain as well, will get mri imaging to r/o infection involvement Her left tka is without acute changes concerning for infection    Repeat bcx 7/25 negative 7/25 tte poor quality.  7/28 TEE without evidence endocarditis 7/26 Mri thoracic spine no abscess; t6-9 ?early OM changes     Fortunately at this time doesn't appear she has endocarditis or might be too early to see on TEE. Would presume without endocarditis mri finding doesn't suggest osteomyelitis     Would treat her for total 10 days vancomycin from 7/25, and follow up 2-3 weeks off antibiotics   02/13/2021  Doing very well no sign of recrudescent of BSI Prevoius thoracic pain not worse off abx. Again unlikely has OM Granulicatella source likely dental procedures 3-4 weeks before the admission      Plan: No need for id f/u further Call ID clinic if fever, chill, persistent worsening back pain or other joint pain   Follow-up: No follow-ups on file.  I have spent a total of 20 minutes of face-to-face and non-face-to-face time, excluding clinical staff time, preparing to see patient, ordering tests and/or medications, and provide counseling the patient     Jabier Mutton, [place] for [place] [phone]  pager   [phone] cell 02/13/2021, 11:30 AM

## 2021-02-15 LAB — CULTURE, BLOOD (SINGLE): Culture: NO GROWTH

## 2021-02-24 LAB — CULTURE, BLOOD (ROUTINE X 2)

## 2021-03-13 ENCOUNTER — Encounter: Payer: Medicare Other | Admitting: Gastroenterology

## 2021-03-18 ENCOUNTER — Ambulatory Visit
Admission: RE | Admit: 2021-03-18 | Discharge: 2021-03-18 | Disposition: A | Discharge status: Home / Self Care | Payer: Medicare Other | Source: Ambulatory Visit | Attending: Endocrinology | Admitting: Endocrinology

## 2021-03-18 ENCOUNTER — Other Ambulatory Visit: Payer: Self-pay

## 2021-03-18 DIAGNOSIS — Z1231 Encounter for screening mammogram for malignant neoplasm of breast: Secondary | ICD-10-CM

## 2021-04-08 ENCOUNTER — Ambulatory Visit (INDEPENDENT_AMBULATORY_CARE_PROVIDER_SITE_OTHER): Payer: Medicare Other | Admitting: Gastroenterology

## 2021-04-08 ENCOUNTER — Encounter: Payer: Self-pay | Admitting: Gastroenterology

## 2021-04-08 ENCOUNTER — Other Ambulatory Visit (INDEPENDENT_AMBULATORY_CARE_PROVIDER_SITE_OTHER): Payer: Medicare Other

## 2021-04-08 VITALS — BP 144/70 | HR 104 | Ht 60.0 in | Wt 142.0 lb

## 2021-04-08 DIAGNOSIS — Z86010 Personal history of colonic polyps: Secondary | ICD-10-CM

## 2021-04-08 DIAGNOSIS — Z992 Dependence on renal dialysis: Secondary | ICD-10-CM

## 2021-04-08 DIAGNOSIS — N186 End stage renal disease: Secondary | ICD-10-CM | POA: Diagnosis not present

## 2021-04-08 DIAGNOSIS — R7989 Other specified abnormal findings of blood chemistry: Secondary | ICD-10-CM

## 2021-04-08 LAB — HEPATIC FUNCTION PANEL
ALT: 23 U/L (ref 0–35)
AST: 26 U/L (ref 0–37)
Albumin: 4.5 g/dL (ref 3.5–5.2)
Alkaline Phosphatase: 162 U/L — ABNORMAL HIGH (ref 39–117)
Bilirubin, Direct: 0.1 mg/dL (ref 0.0–0.3)
Total Bilirubin: 0.5 mg/dL (ref 0.2–1.2)
Total Protein: 7.5 g/dL (ref 6.0–8.3)

## 2021-04-08 MED ORDER — SUTAB 1479-225-188 MG PO TABS: 1.0000 | ORAL_TABLET | Freq: Once | ORAL | 0 refills | Status: AC

## 2021-04-08 NOTE — Progress Notes (Signed)
HPI :  70 y/o female with a history of DM, ESRD on HD, recent history of sepsis, history of colon polyps, here to reestablish care to discuss surveillance colonoscopy in light of her medical problems.  Referred by Dr. Reynold Bowen.  Her last colonoscopy was with me in October 2018.  She had 6 polyps removed at that time, 1 of which was tubular adenoma with high-grade dysplasia.  It was recommended she have a follow-up colonoscopy in 3 years.  I have not seen her since that time.  Major changes to her health include worsening kidney disease leading to HD since May 2021.  She had dialysis on Tuesday, Thursday, Saturday.  She has been doing okay with this.  She was admitted to the hospital in July then again in August and found to have sepsis.  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 Had dental procedure 3 weeks prior, suspected cause per ID. TEE negative. LFTs went up thought to be due to sepsis, she had a mild alkaline phosphatase and bilirubin elevation.  Right upper quadrant ultrasound showed no biliary ductal dilation.  She did have some suspected gallstones.  She denied any abdominal pain during that time.  She underwent a TEE per cardiology which showed a preserved ejection fraction no evidence of endocarditis or significant valvular disease.  She had respiratory failure during that admission but did not require intubation.  She states she was weak from hospitalization but has been working with PT and feeling much better at this point time.  She is pursuing an evaluation for transplant when she completes her course of PT.  She denies any problems with her bowels.  No blood in her stools.  She has occasional hemorrhoids that bother her but nothing significant.  She denies any family history of colon cancer.  She has not had her liver function test checked again since her hospitalization.  She has a chronic anemia thought to be related to her kidney disease.  She denies any cardiopulmonary  symptoms currently.   EGD 04/18/2017 -  - A 2 cm hiatal hernia was present. - The exam of the esophagus was otherwise normal. - The entire examined stomach was normal. Biopsies were taken from antrum, body, incisura, with a cold forceps for Helicobacter pylori testing given anemia. - The duodenal bulb and second portion of the duodenum were normal.   Colonoscopy 04/18/2017 - The perianal and digital rectal examinations were normal. - The terminal ileum appeared normal. - A diminutive polyp was found in the cecum. The polyp was sessile. The polyp was removed with a cold biopsy forceps. Resection and retrieval were complete. - A 4 mm polyp was found in the cecum. The polyp was sessile. The polyp was removed with a cold snare. Resection and retrieval were complete. - Two sessile polyps were found in the ascending colon. The polyps were 3 to 5 mm in size. These polyps were removed with a cold snare. Resection and retrieval were complete. - A 3 mm polyp was found in the transverse colon. The polyp was sessile. The polyp was removed with a cold biopsy forceps. Resection and retrieval were complete. - A 8 mm polyp was found in the descending colon. The polyp was pedunculated. The polyp was removed with a hot snare. Resection and retrieval were complete. - Internal hemorrhoids were found during retroflexion. - The colon was extremely tortous with significant looping. The exam was otherwise without abnormality.  Diagnosis 1. Surgical [P], gastric antrum and gastric body -  BENIGN GASTRIC MUCOSA. - NEGATIVE FOR HELICOBACTER PYLORI. - NO INTESTINAL METAPLASIA, DYSPLASIA, OR MALIGNANCY. 2. Surgical [P], descending, polyp - TUBULAR ADENOMA WITH FOCAL HIGH GRADE DYSPLASIA (X2 FRAGMENTS). - TUBULAR ADENOMA (X2 FRAGMENTS). - NO MALIGNANCY IDENTIFIED. 3. Surgical [P], cecum -2, and ascending -2, and transverse, polyp (5) - TUBULAR ADENOMA (X6 FRAGMENTS). - NO HIGH GRADE DYSPLASIA OR  MALIGNANCY.   TEE 01/22/21 - EF 60-65%, no significant valvular disease  TTE 01/19/21 - EF 55%, grade I DD   RUQ [place] 02/10/21 - IMPRESSION: 1. Similar echodense shadowing material in the gallbladder lumen suggestive of sandlike stones. Negative for acute cholecystitis. 2. No biliary dilatation 3. Right pleural effusion   CTA Chest - 02/10/21- IMPRESSION: Negative for significant acute pulmonary embolus by CTA. Stable cardiomegaly without CHF Trace effusions and dependent basilar atelectasis. Cholelithiasis Aortic Atherosclerosis (ICD10-I70.0).  CT abdomen / pelvis with contrast 01/17/21 -  IMPRESSION: 1. No evidence of pulmonary artery embolism. 2. Cardiomegaly and small pericardial effusion. 3. Diffuse body wall edema could reflect early volume overload/anasarca. 4. Distended gallbladder with layering hyperdensity, could reflect tiny biliary stones/sand or vicarious extravasation of contrast if patient has had a recent contrasted exam. While there is no pericholecystic fluid or inflammation, could correlate for right upper quadrant symptoms as isolated distension can be an early sign of developing cholecystitis. No biliary ductal dilatation or intraductal gallstones. 5. Small amount of fluid surrounding the anastomosis of a right femoral AV fistula in the right inguinal chain. Could reflect small chronic postoperative seroma though should correlate fistula function and local assessment of the inguinal region. 6. Small broad-based umbilical hernia into which protrudes portion of the anti mesenteric transverse colon without evidence of vascular compromise or obstruction. 7. Diffuse osseous manifestations of renal osteodystrophy.   Past Medical History:  Diagnosis Date   Acute CHF (congestive heart failure) ([place]) 02/02/2018   Anemia    "get monthly injections to boost my HgB" (02/02/2018)   Anxiety    Asthma    Back pain    intractable low back   Chronic neck pain    CKD  (chronic kidney disease), stage III (HCC)    on dialysis T/Th/Sa Jeneen Rinks   Depression    Dysrhythmia    palpitations or heart racing periodic. has had for years- Dr Mare Ferrari follows.   GERD (gastroesophageal reflux disease)    History of blood transfusion 2016   After knee replacemnet   Hx MRSA infection    Hyperlipidemia    Hypertension    Migraines    "~ 2/month" (02/02/2018), gets headaches with dialysis   Nerve damage    right hand   Osteoarthritis    "knees, neck" (02/02/2018)   Palpitations    Pneumonia    "twice in 2018" (02/02/2018)   Pulmonary artery hypertension (HCC)    Seasonal allergies    Type II diabetes mellitus (HCC)    insulin dependent - fasting 140-200      Past Surgical History:  Procedure Laterality Date   ABDOMINAL HYSTERECTOMY  03/2001   Abdominal supracervical hysterectomy, left salpingo-oophorectomy   ANTERIOR CERVICAL DECOMP/DISCECTOMY FUSION N/A 01/01/2014   Procedure: CERVICAL THREE-FOUR,CERVICAL FIVE-SIX,CERVICAL SIX-SEVEN ANTERIOR CERVICAL DECOMPRESSION/DISCECTOMY/FUSION;  Surgeon: Kristeen Miss, MD;  Location: [facility];  Service: Neurosurgery;  Laterality: N/A;  left-side approach   AV FISTULA PLACEMENT Right 02/13/2020   Procedure: ARTERIOVENOUS (AV) FISTULA CREATION;  Surgeon: Waynetta Sandy, MD;  Location: [place];  Service: Vascular;  Laterality: Right;   AV FISTULA PLACEMENT Right  08/11/2020   Procedure: INSERTION OF RIGHT THIGH ARTERIOVENOUS (AV) GORE-TEX GRAFT;  Surgeon: Angelia Mould, MD;  Location: [place];  Service: Vascular;  Laterality: Right;   [place] Right 04/16/2020   Procedure: BASCILIC VEIN TRANSPOSITION SECOND STAGE RIGHT;  Surgeon: Waynetta Sandy, MD;  Location: [place];  Service: Vascular;  Laterality: Right;   CATARACT EXTRACTION W/ INTRAOCULAR LENS  IMPLANT, BILATERAL Bilateral 2012   CESAREAN SECTION     COLONOSCOPY W/ POLYPECTOMY     DILATION AND CURETTAGE  OF UTERUS  X 2   ESOPHAGOGASTRODUODENOSCOPY     HAND SURGERY Left 1970s   laceration repair 4th and 5th digits   KNEE ARTHROSCOPY Left    torn mensicus   LIGATION OF COMPETING BRANCHES OF ARTERIOVENOUS FISTULA Right 05/07/2020   Procedure: LIGATION OF RIGHT UPPER EXTREMITY FISTULA;  Surgeon: Waynetta Sandy, MD;  Location: [place];  Service: Vascular;  Laterality: Right;   NASAL SINUS SURGERY     RIGHT HEART CATH N/A 06/23/2018   Procedure: RIGHT HEART CATH;  Surgeon: Jolaine Artist, MD;  Location: [place] [facility];  Service: Cardiovascular;  Laterality: N/A;   TEE WITHOUT CARDIOVERSION N/A 01/22/2021   Procedure: TRANSESOPHAGEAL ECHOCARDIOGRAM (TEE);  Surgeon: Skeet Latch, MD;  Location: [place];  Service: Cardiovascular;  Laterality: N/A;   TOTAL KNEE ARTHROPLASTY Left 02/12/2015   Procedure: TOTAL KNEE ARTHROPLASTY;  Surgeon: [name], MD;  Location: [place];  Service: Orthopedics;  Laterality: Left;   Family History  Problem Relation Age of Onset   Heart disease Mother    Alzheimer's disease Mother    Heart failure Father    Colon cancer Neg Hx    Stomach cancer Neg Hx    Esophageal cancer Neg Hx    Social History   Tobacco Use   Smoking status: Never   Smokeless tobacco: Never  Vaping Use   Vaping Use: Never used  Substance Use Topics   Alcohol use: Never   Drug use: Never   Current Outpatient Medications  Medication Sig Dispense Refill   acetaminophen (TYLENOL) 650 MG CR tablet Take 650-1,300 mg by mouth every 8 (eight) hours as needed for pain (pain).      Alpha-D-Galactosidase (BEANO PO) Take 1-2 tablets by mouth daily as needed (gas). With certain foods.     ALPRAZolam (XANAX) 0.5 MG tablet Take 0.25-0.5 mg by mouth 2 (two) times daily as needed for anxiety.     aspirin EC 81 MG tablet Take 81 mg by mouth every Monday, Wednesday, and Friday. Swallow whole.     Azelastine HCl 0.15 % SOLN Place 2 sprays into both nostrils daily.      budesonide-formoterol (SYMBICORT) 160-4.5 MCG/ACT inhaler Inhale 2 puffs into the lungs 2 (two) times daily as needed (cough/respiratory issues.).      diclofenac sodium (VOLTAREN) 1 % GEL Apply 1 application topically 4 (four) times daily as needed (back and knee).     ezetimibe-simvastatin (VYTORIN) 10-40 MG per tablet Take 1 tablet by mouth at bedtime.      ferric citrate (AURYXIA) 1 GM 210 MG(Fe) tablet Take 210-420 mg by mouth See admin instructions. Take 2 tablets (420 mg) by mouth 3 times daily with meals & take 1 tablet (210 mg) by mouth with each snack     fexofenadine (ALLEGRA) 180 MG tablet Take 180 mg by mouth daily.     guaiFENesin (MUCINEX) 600 MG 12 hr tablet Take 600  mg by mouth in the morning and at bedtime.      insulin lispro (HUMALOG) 100 UNIT/ML injection Inject 4-10 Units into the skin 3 (three) times daily before meals. Sliding Scale     LANTUS SOLOSTAR 100 UNIT/ML Solostar Pen Inject 18 Units into the skin at bedtime.   3   losartan (COZAAR) 25 MG tablet Take 12.5 mg by mouth daily.     Magnesium 200 MG TABS Take 0.5 tablets (100 mg total) by mouth 2 (two) times daily.     midodrine (PROAMATINE) 10 MG tablet Take 10 mg by mouth every Tuesday, Thursday, and Saturday at 6 PM. Pre dialysis     multivitamin (RENA-VIT) TABS tablet Take 1 tablet by mouth daily with breakfast.      nitroGLYCERIN (NITROSTAT) 0.4 MG SL tablet Place 1 tablet (0.4 mg total) under the tongue every 5 (five) minutes x 3 doses as needed for chest pain. 6 tablet 1   olopatadine (PATANOL) 0.1 % ophthalmic solution Place 1 drop into both eyes daily as needed (dry itchy eyes).     simethicone (MYLICON) 80 MG chewable tablet Chew 160 mg by mouth daily as needed for flatulence (indigestion.).      VITAMIN D, CHOLECALCIFEROL, PO Take 1 tablet by mouth Every Tuesday,Thursday,and Saturday with dialysis.     No current facility-administered medications for this visit.   Allergies  Allergen Reactions   Doxycycline  Other (See Comments)   Entresto [Sacubitril-Valsartan] Itching   Metformin Other (See Comments)    weakness   Omnicef [Cefdinir] Other (See Comments)    GI Upset   Aspirin Other (See Comments)    GI issues, Can tolerate low aspirin   Augmentin [Amoxicillin-Pot Clavulanate] Nausea And Vomiting and Other (See Comments)    GI issues DID THE REACTION INVOLVE: Swelling of the face/tongue/throat, SOB, or low BP? No Sudden or severe rash/hives, skin peeling, or the inside of the mouth or nose? No Did it require medical treatment? No When did it last happen?      long time  If all above answers are "NO", may proceed with cephalosporin use.    Erythromycin Nausea And Vomiting and Other (See Comments)    Gi issues   Gabapentin Swelling   Nickel Itching and Rash    Breakouts      Review of Systems: All systems reviewed and negative except where noted in HPI.   Lab Results  Component Value Date   WBC 7.3 02/10/2021   HGB 10.3 (L) 02/10/2021   HCT 32.9 (L) 02/10/2021   MCV 86.8 02/10/2021   PLT 245 02/10/2021    Lab Results  Component Value Date   CREATININE 6.19 (H) 02/12/2021   BUN 35 (H) 02/12/2021   NA 134 (L) 02/12/2021   K 3.9 02/12/2021   CL 93 (L) 02/12/2021   CO2 28 02/12/2021    Lab Results  Component Value Date   ALT 13 02/11/2021   AST 20 02/11/2021   ALKPHOS 154 (H) 02/11/2021   BILITOT 2.3 (H) 02/11/2021      Physical Exam: BP (!) 144/70   Pulse (!) 104   Ht 5' (1.524 m)   Wt 142 lb (64.4 kg)   BMI 27.73 kg/m  Constitutional: Pleasant,well-developed, female in no acute distress. HEENT: Normocephalic and atraumatic. Conjunctivae are normal. No scleral icterus. Neck supple.  Cardiovascular: Normal rate, regular rhythm.  Pulmonary/chest: Effort normal and breath sounds normal.  Abdominal: Soft, nondistended, nontender.  There are no masses palpable.  Extremities: no edema Lymphadenopathy: No cervical adenopathy noted. Neurological: Alert and  oriented to person place and time. Skin: Skin is warm and dry. No rashes noted. Psychiatric: Normal mood and affect. Behavior is normal.   ASSESSMENT AND PLAN: 70 year old female here to reestablish for the following issues:  History of colon polyps Elevated liver function testing ESRD on HD  Patient with multiple adenomatous polyps 4 years ago, 1 of which contained high-grade dysplasia.  We discussed this is a high risk polyp and she is overdue for surveillance, was due 1 year ago.  She denies any problems with her bowels and was asymptomatic from that standpoint other than hemorrhoids which bother her intermittently.  She had unfortunately a hospitalization in August for sepsis, thought to be related to recent dental procedure, although the patient thinks she had food poisoning.  She had an extensive work-up, TEE and echo looked okay, she actually has normal ejection fraction at this time in light of her history of CHF.  She had some mild elevation in alk phos and bilirubin thought to be related to sepsis, ultrasound showed no biliary ductal dilation.  She denied any abdominal pain at the time.  She has recovered from her hospitalization and is doing much better.  She is working with PT but feels that she is stable to proceed with colonoscopy at this time.  We discussed risks and benefits of colonoscopy and anesthesia and she wants to proceed with that.  We will try to coordinate this around her dialysis schedule.  Otherwise I will repeat her LFTs to ensure they have normalized since her hospitalization.  Assuming have normalized, no further work-up needed.  She inquires about a flu shot today, we do not have the dose that she needs in the office today, she will get this elsewhere.  Plan: - scheduled surveillance colonoscopy, she has recovered from her hospitalization - repeat LFTs  Jolly Mango, MD [facility]  CC: Reynold Bowen, MD

## 2021-04-08 NOTE — Patient Instructions (Addendum)
If you are age 70 or older, your body mass index should be between 23-30. Your Body mass index is 27.73 kg/m. If this is out of the aforementioned range listed, please consider follow up with your Primary Care Provider.  If you are age 68 or younger, your body mass index should be between 19-25. Your Body mass index is 27.73 kg/m. If this is out of the aformentioned range listed, please consider follow up with your Primary Care Provider.   __________________________________________________________  The [facility] providers would like to encourage you to use [facility] to communicate with providers for non-urgent requests or questions.  Due to long hold times on the telephone, sending your provider a message by [facility] may be a faster and more efficient way to get a response.  Please allow 48 business hours for a response.  Please remember that this is for non-urgent requests.   You have been scheduled for a colonoscopy. Please follow written instructions given to you at your visit today.  Please pick up your prep supplies at the pharmacy within the next 1-3 days. If you use inhalers (even only as needed), please bring them with you on the day of your procedure.  You will be contacted by [facility] regarding your Sutab prescription for your colonoscopy prep.  They will mail you the prescription after you pay your $40 co-pay over the phone.   Please go to the lab in the basement of our building to have lab work done as you leave today. Hit "B" for basement when you get on the elevator.  When the doors open the lab is on your left.  We will call you with the results. Thank you.  Thank you for entrusting me with your care and for choosing [facility], Dr. Gerrard Cellar

## 2021-04-09 ENCOUNTER — Other Ambulatory Visit: Payer: Self-pay

## 2021-04-09 DIAGNOSIS — N186 End stage renal disease: Secondary | ICD-10-CM

## 2021-04-09 DIAGNOSIS — Z86010 Personal history of colonic polyps: Secondary | ICD-10-CM

## 2021-04-09 DIAGNOSIS — R7989 Other specified abnormal findings of blood chemistry: Secondary | ICD-10-CM

## 2021-04-10 ENCOUNTER — Other Ambulatory Visit (INDEPENDENT_AMBULATORY_CARE_PROVIDER_SITE_OTHER): Payer: Medicare Other

## 2021-04-10 DIAGNOSIS — R7989 Other specified abnormal findings of blood chemistry: Secondary | ICD-10-CM

## 2021-04-10 DIAGNOSIS — Z86010 Personal history of colonic polyps: Secondary | ICD-10-CM | POA: Diagnosis not present

## 2021-04-10 DIAGNOSIS — N186 End stage renal disease: Secondary | ICD-10-CM | POA: Diagnosis not present

## 2021-04-10 DIAGNOSIS — Z992 Dependence on renal dialysis: Secondary | ICD-10-CM | POA: Diagnosis not present

## 2021-04-10 LAB — HEPATIC FUNCTION PANEL
ALT: 20 U/L (ref 0–35)
AST: 21 U/L (ref 0–37)
Albumin: 4.5 g/dL (ref 3.5–5.2)
Alkaline Phosphatase: 159 U/L — ABNORMAL HIGH (ref 39–117)
Bilirubin, Direct: 0.1 mg/dL (ref 0.0–0.3)
Total Bilirubin: 0.6 mg/dL (ref 0.2–1.2)
Total Protein: 7.5 g/dL (ref 6.0–8.3)

## 2021-04-10 LAB — GAMMA GT: GGT: 44 U/L (ref 7–51)

## 2021-04-14 LAB — ANTI-SMOOTH MUSCLE ANTIBODY, IGG: Actin (Smooth Muscle) Antibody (IGG): <20 U (ref <20)

## 2021-04-14 LAB — ANA: Anti Nuclear Antibody (ANA): POSITIVE — AB

## 2021-04-14 LAB — HEPATITIS C ANTIBODY
Hepatitis C Ab: NONREACTIVE
SIGNAL TO CUT-OFF: 0.1 (ref <1.00)

## 2021-04-14 LAB — ANTI-NUCLEAR AB-TITER (ANA TITER): ANA Titer 1: 1:40 {titer} — ABNORMAL HIGH

## 2021-04-14 LAB — IGG: IgG (Immunoglobin G), Serum: 1183 mg/dL (ref 600–1540)

## 2021-04-14 LAB — MITOCHONDRIAL ANTIBODIES: Mitochondrial M2 Ab, IgG: <=20 U (ref <=20.0)

## 2021-05-08 ENCOUNTER — Encounter: Payer: Self-pay | Admitting: Gastroenterology

## 2021-05-08 ENCOUNTER — Ambulatory Visit (AMBULATORY_SURGERY_CENTER): Payer: Medicare Other | Admitting: Gastroenterology

## 2021-05-08 ENCOUNTER — Other Ambulatory Visit: Payer: Self-pay

## 2021-05-08 VITALS — BP 158/76 | HR 91 | Temp 97.7°F | Resp 22 | Ht 60.0 in | Wt 142.0 lb

## 2021-05-08 DIAGNOSIS — D120 Benign neoplasm of cecum: Secondary | ICD-10-CM

## 2021-05-08 DIAGNOSIS — Z86010 Personal history of colonic polyps: Secondary | ICD-10-CM

## 2021-05-08 DIAGNOSIS — D123 Benign neoplasm of transverse colon: Secondary | ICD-10-CM

## 2021-05-08 DIAGNOSIS — D122 Benign neoplasm of ascending colon: Secondary | ICD-10-CM

## 2021-05-08 MED ORDER — SODIUM CHLORIDE 0.9 % IV SOLN: 500.0000 mL | Freq: Once | INTRAVENOUS | Status: DC

## 2021-05-08 NOTE — Op Note (Signed)
[facility] Patient Name: Belinda [name] Procedure Date: 05/08/2021 2:05 PM MRN: [id number] Endoscopist: Belinda [name] , MD Age: 70 Referring MD:  Date of Birth: 1951-06-22 Gender: Female Account #: [id number] Procedure:                Colonoscopy Indications:              High risk colon cancer surveillance: Personal                            history of colonic polyps (6 adenomas 03/2017 - one                            with high grade dysplasia) Medicines:                Monitored Anesthesia Care Procedure:                Pre-Anesthesia Assessment:                           - Prior to the procedure, a History and Physical                            was performed, and patient medications and                            allergies were reviewed. The patient's tolerance of                            previous anesthesia was also reviewed. The risks                            and benefits of the procedure and the sedation                            options and risks were discussed with the patient.                            All questions were answered, and informed consent                            was obtained. Prior Anticoagulants: The patient has                            taken no previous anticoagulant or antiplatelet                            agents. ASA Grade Assessment: III - A patient with                            severe systemic disease. After reviewing the risks                            and benefits, the patient was deemed in  satisfactory condition to undergo the procedure.                           After obtaining informed consent, the colonoscope                            was passed under direct vision. Throughout the                            procedure, the patient's blood pressure, pulse, and                            oxygen saturations were monitored continuously. The                            [id number] PCF-[id number] [place] SB  Colonoscope was introduced                            through the anus and advanced to the the cecum,                            identified by appendiceal orifice and ileocecal                            valve. The patient tolerated the procedure well.                            The colonoscopy was technically difficult and                            complex due to a tortuous colon. Scope In: 2:25:39 PM Scope Out: 3:02:40 PM Scope Withdrawal Time: 0 hours 15 minutes 57 seconds  Total Procedure Duration: 0 hours 37 minutes 1 second  Findings:                 The perianal and digital rectal examinations were                            normal.                           The colon was extremely tortuous - abdominal                            pressure used to achieve cecal intubation, despite                            this it was technically challenging.                           A 3 mm polyp was found in the cecum. The polyp was                            sessile. The polyp was removed with a cold snare.  Resection and retrieval were complete.                           A 3 mm polyp was found in the ascending colon. The                            polyp was sessile. The polyp was removed with a                            cold snare. Resection and retrieval were complete.                           Two sessile polyps were found in the transverse                            colon. The polyps were 3 mm in size. These polyps                            were removed with a cold snare. Resection and                            retrieval were complete.                           Internal hemorrhoids were found during retroflexion.                           The exam was otherwise without abnormality. Complications:            No immediate complications. Estimated blood loss:                            Minimal. Estimated Blood Loss:     Estimated blood loss was  minimal. Impression:               - Extremely tortuous colon.                           - One 3 mm polyp in the cecum, removed with a cold                            snare. Resected and retrieved.                           - One 3 mm polyp in the ascending colon, removed                            with a cold snare. Resected and retrieved.                           - Two 3 mm polyps in the transverse colon, removed                            with a cold snare. Resected and retrieved.                           -  Internal hemorrhoids.                           - The examination was otherwise normal. Recommendation:           - Patient has a contact number available for                            emergencies. The signs and symptoms of potential                            delayed complications were discussed with the                            patient. Return to normal activities tomorrow.                            Written discharge instructions were provided to the                            patient.                           - Resume previous diet.                           - Continue present medications.                           - Await pathology results. Belinda Lipps P. Chancie Lampert, MD 05/08/2021 3:08:03 PM This report has been signed electronically.

## 2021-05-08 NOTE — Progress Notes (Signed)
[facility] History and Physical   Primary Care Physician:  Reynold Bowen, MD   Reason for Procedure:   History of colon polyps  Plan:    colonoscopy     HPI: Belinda [name] is a 70 y.o. female  here for colonoscopy surveillance - history of adenomas in the past 03/2015, one with HGD. Overdue for surveillance. Patient denies any bowel symptoms at this time. No family history of colon cancer known. Otherwise feels well without any cardiopulmonary symptoms. She has no complaints today.   Past Medical History:  Diagnosis Date   Acute CHF (congestive heart failure) ([place]) 02/02/2018   Anemia    "get monthly injections to boost my HgB" (02/02/2018)   Anxiety    Asthma    Back pain    intractable low back   Chronic neck pain    CKD (chronic kidney disease), stage III (HCC)    on dialysis T/Th/Sa Jeneen Rinks   Depression    Dysrhythmia    palpitations or heart racing periodic. has had for years- Dr Mare Ferrari follows.   GERD (gastroesophageal reflux disease)    History of blood transfusion 2016   After knee replacemnet   Hx MRSA infection    Hyperlipidemia    Hypertension    Migraines    "~ 2/month" (02/02/2018), gets headaches with dialysis   Nerve damage    right hand   Osteoarthritis    "knees, neck" (02/02/2018)   Palpitations    Pneumonia    "twice in 2018" (02/02/2018)   Pulmonary artery hypertension (HCC)    Seasonal allergies    Type II diabetes mellitus (HCC)    insulin dependent - fasting 140-200     Past Surgical History:  Procedure Laterality Date   ABDOMINAL HYSTERECTOMY  03/2001   Abdominal supracervical hysterectomy, left salpingo-oophorectomy   ANTERIOR CERVICAL DECOMP/DISCECTOMY FUSION N/A 01/01/2014   Procedure: CERVICAL THREE-FOUR,CERVICAL FIVE-SIX,CERVICAL SIX-SEVEN ANTERIOR CERVICAL DECOMPRESSION/DISCECTOMY/FUSION;  Surgeon: Kristeen Miss, MD;  Location: [facility];  Service: Neurosurgery;  Laterality: N/A;  left-side approach   AV  FISTULA PLACEMENT Right 02/13/2020   Procedure: ARTERIOVENOUS (AV) FISTULA CREATION;  Surgeon: Waynetta Sandy, MD;  Location: [facility];  Service: Vascular;  Laterality: Right;   AV FISTULA PLACEMENT Right 08/11/2020   Procedure: INSERTION OF RIGHT THIGH ARTERIOVENOUS (AV) GORE-TEX GRAFT;  Surgeon: Angelia Mould, MD;  Location: [place];  Service: Vascular;  Laterality: Right;   [place] Right 04/16/2020   Procedure: BASCILIC VEIN TRANSPOSITION SECOND STAGE RIGHT;  Surgeon: Waynetta Sandy, MD;  Location: [place];  Service: Vascular;  Laterality: Right;   CATARACT EXTRACTION W/ INTRAOCULAR LENS  IMPLANT, BILATERAL Bilateral 2012   CESAREAN SECTION     COLONOSCOPY     COLONOSCOPY W/ POLYPECTOMY     DILATION AND CURETTAGE OF UTERUS  X 2   ESOPHAGOGASTRODUODENOSCOPY     HAND SURGERY Left 1970s   laceration repair 4th and 5th digits   KNEE ARTHROSCOPY Left    torn mensicus   LIGATION OF COMPETING BRANCHES OF ARTERIOVENOUS FISTULA Right 05/07/2020   Procedure: LIGATION OF RIGHT UPPER EXTREMITY FISTULA;  Surgeon: Waynetta Sandy, MD;  Location: [facility];  Service: Vascular;  Laterality: Right;   NASAL SINUS SURGERY     POLYPECTOMY     RIGHT HEART CATH N/A 06/23/2018   Procedure: RIGHT HEART CATH;  Surgeon: Jolaine Artist, MD;  Location: [facility];  Service: Cardiovascular;  Laterality:  N/A;   TEE WITHOUT CARDIOVERSION N/A 01/22/2021   Procedure: TRANSESOPHAGEAL ECHOCARDIOGRAM (TEE);  Surgeon: Skeet Latch, MD;  Location: [place];  Service: Cardiovascular;  Laterality: N/A;   TOTAL KNEE ARTHROPLASTY Left 02/12/2015   Procedure: TOTAL KNEE ARTHROPLASTY;  Surgeon: [name], MD;  Location: [place];  Service: Orthopedics;  Laterality: Left;   UPPER GASTROINTESTINAL ENDOSCOPY      Prior to Admission medications   Medication Sig Start Date End Date Taking? Authorizing Provider  acetaminophen (TYLENOL) 650  MG CR tablet Take 650-1,300 mg by mouth every 8 (eight) hours as needed for pain (pain).    Yes [provider]  Alpha-D-Galactosidase (BEANO PO) Take 1-2 tablets by mouth daily as needed (gas). With certain foods.   Yes [provider]  ALPRAZolam (XANAX) 0.5 MG tablet Take 0.25-0.5 mg by mouth 2 (two) times daily as needed for anxiety. 12/13/20  Yes [provider]  aspirin EC 81 MG tablet Take 81 mg by mouth every Monday, Wednesday, and Friday. Swallow whole.   Yes [provider]  Azelastine HCl 0.15 % SOLN Place 2 sprays into both nostrils daily.   Yes [provider]  diclofenac sodium (VOLTAREN) 1 % GEL Apply 1 application topically 4 (four) times daily as needed (back and knee).   Yes [provider]  ezetimibe-simvastatin (VYTORIN) 10-40 MG per tablet Take 1 tablet by mouth at bedtime.    Yes [provider]  ferric citrate (AURYXIA) 1 GM 210 MG(Fe) tablet Take 210-420 mg by mouth See admin instructions. Take 2 tablets (420 mg) by mouth 3 times daily with meals & take 1 tablet (210 mg) by mouth with each snack 11/01/19  Yes [provider]  fexofenadine (ALLEGRA) 180 MG tablet Take 180 mg by mouth daily.   Yes [provider]  guaiFENesin (MUCINEX) 600 MG 12 hr tablet Take 600 mg by mouth in the morning and at bedtime.    Yes [provider]  insulin lispro (HUMALOG) 100 UNIT/ML injection Inject 4-10 Units into the skin 3 (three) times daily before meals. Sliding Scale   Yes [provider]  LANTUS SOLOSTAR 100 UNIT/ML Solostar Pen Inject 18 Units into the skin at bedtime.  04/19/18  Yes [provider]  losartan (COZAAR) 25 MG tablet Take 12.5 mg by mouth daily. 07/11/20  Yes [provider]  Magnesium 200 MG TABS Take 0.5 tablets (100 mg total) by mouth 2 (two) times daily.   Yes [provider]  midodrine (PROAMATINE) 10 MG tablet Take 10 mg by mouth every Tuesday,  Thursday, and Saturday at 6 PM. Pre dialysis   Yes [provider]  multivitamin (RENA-VIT) TABS tablet Take 1 tablet by mouth daily with breakfast.  11/01/19  Yes [provider]  olopatadine (PATANOL) 0.1 % ophthalmic solution Place 1 drop into both eyes daily as needed (dry itchy eyes).   Yes [provider]  simethicone (MYLICON) 80 MG chewable tablet Chew 160 mg by mouth daily as needed for flatulence (indigestion.).    Yes [provider]  VITAMIN D, CHOLECALCIFEROL, PO Take 1 tablet by mouth Every Tuesday,Thursday,and Saturday with dialysis.   Yes [provider]  budesonide-formoterol (SYMBICORT) 160-4.5 MCG/ACT inhaler Inhale 2 puffs into the lungs 2 (two) times daily as needed (cough/respiratory issues.).     [provider]  nitroGLYCERIN (NITROSTAT) 0.4 MG SL tablet Place 1 tablet (0.4 mg total) under the tongue every 5 (five) minutes x 3 doses as needed  for chest pain. 03/14/20   Bensimhon, Shaune Pascal, MD    Current Outpatient Medications  Medication Sig Dispense Refill   acetaminophen (TYLENOL) 650 MG CR tablet Take 650-1,300 mg by mouth every 8 (eight) hours as needed for pain (pain).      Alpha-D-Galactosidase (BEANO PO) Take 1-2 tablets by mouth daily as needed (gas). With certain foods.     ALPRAZolam (XANAX) 0.5 MG tablet Take 0.25-0.5 mg by mouth 2 (two) times daily as needed for anxiety.     aspirin EC 81 MG tablet Take 81 mg by mouth every Monday, Wednesday, and Friday. Swallow whole.     Azelastine HCl 0.15 % SOLN Place 2 sprays into both nostrils daily.     diclofenac sodium (VOLTAREN) 1 % GEL Apply 1 application topically 4 (four) times daily as needed (back and knee).     ezetimibe-simvastatin (VYTORIN) 10-40 MG per tablet Take 1 tablet by mouth at bedtime.      ferric citrate (AURYXIA) 1 GM 210 MG(Fe) tablet Take 210-420 mg by mouth See admin instructions. Take 2 tablets (420 mg) by mouth 3 times daily with meals & take 1  tablet (210 mg) by mouth with each snack     fexofenadine (ALLEGRA) 180 MG tablet Take 180 mg by mouth daily.     guaiFENesin (MUCINEX) 600 MG 12 hr tablet Take 600 mg by mouth in the morning and at bedtime.      insulin lispro (HUMALOG) 100 UNIT/ML injection Inject 4-10 Units into the skin 3 (three) times daily before meals. Sliding Scale     LANTUS SOLOSTAR 100 UNIT/ML Solostar Pen Inject 18 Units into the skin at bedtime.   3   losartan (COZAAR) 25 MG tablet Take 12.5 mg by mouth daily.     Magnesium 200 MG TABS Take 0.5 tablets (100 mg total) by mouth 2 (two) times daily.     midodrine (PROAMATINE) 10 MG tablet Take 10 mg by mouth every Tuesday, Thursday, and Saturday at 6 PM. Pre dialysis     multivitamin (RENA-VIT) TABS tablet Take 1 tablet by mouth daily with breakfast.      olopatadine (PATANOL) 0.1 % ophthalmic solution Place 1 drop into both eyes daily as needed (dry itchy eyes).     simethicone (MYLICON) 80 MG chewable tablet Chew 160 mg by mouth daily as needed for flatulence (indigestion.).      VITAMIN D, CHOLECALCIFEROL, PO Take 1 tablet by mouth Every Tuesday,Thursday,and Saturday with dialysis.     budesonide-formoterol (SYMBICORT) 160-4.5 MCG/ACT inhaler Inhale 2 puffs into the lungs 2 (two) times daily as needed (cough/respiratory issues.).      nitroGLYCERIN (NITROSTAT) 0.4 MG SL tablet Place 1 tablet (0.4 mg total) under the tongue every 5 (five) minutes x 3 doses as needed for chest pain. 6 tablet 1   Current Facility-Administered Medications  Medication Dose Route Frequency Provider Last Rate Last Admin   0.9 %  sodium chloride infusion  500 mL Intravenous Once Kordae Buonocore, Carlota Raspberry, MD        Allergies as of 05/08/2021 - Review Complete 05/08/2021  Allergen Reaction Noted   Doxycycline Other (See Comments) 08/17/2020   Entresto [sacubitril-valsartan] Itching 12/18/2018   Metformin Other (See Comments) 08/17/2020   Omnicef [cefdinir] Other (See Comments) 02/03/2018    Aspirin Other (See Comments) 12/14/2013   Augmentin [amoxicillin-pot clavulanate] Nausea And Vomiting and Other (See Comments) 03/15/2011   Erythromycin Nausea And Vomiting and Other (See Comments) 05/15/2012   Gabapentin Swelling 04/05/2017  Nickel Itching and Rash 01/31/2015    Family History  Problem Relation Age of Onset   Heart disease Mother    Alzheimer's disease Mother    Heart failure Father    Colon cancer Neg Hx    Stomach cancer Neg Hx    Esophageal cancer Neg Hx     Social History   Socioeconomic History   Marital status: Married    Spouse name: Dispensing optician   Number of children: 1   Years of education: Not on file   Highest education level: Not on file  Occupational History   Not on file  Tobacco Use   Smoking status: Never   Smokeless tobacco: Never  Vaping Use   Vaping Use: Never used  Substance and Sexual Activity   Alcohol use: Never   Drug use: Never   Sexual activity: Not Currently  Other Topics Concern   Not on file  Social History Narrative   Not on file   Social Determinants of Health   Financial Resource Strain: Not on file  Food Insecurity: Not on file  Transportation Needs: Not on file  Physical Activity: Not on file  Stress: Not on file  Social Connections: Not on file  Intimate Partner Violence: Not on file    Review of Systems: All other review of systems negative except as mentioned in the HPI.  Physical Exam: Vital signs BP (!) 144/67   Pulse 98   Temp 97.7 F (36.5 C) (Temporal)   Ht 5' (1.524 m)   Wt 142 lb (64.4 kg)   SpO2 100%   BMI 27.73 kg/m   General:   Alert,  Well-developed, pleasant and cooperative in NAD Lungs:  Clear throughout to auscultation.   Heart:  Regular rate and rhythm Abdomen:  Soft, nontender and nondistended.   Neuro/Psych:  Alert and cooperative. Normal mood and affect. A and O x 3  Jolly Mango, MD [facility]

## 2021-05-08 NOTE — Progress Notes (Signed)
Sedate, gd SR, tolerated procedure well, VSS, report to RN 

## 2021-05-08 NOTE — Patient Instructions (Signed)
Please read handouts provided. Continue present medications. Await pathology results.   YOU HAD AN ENDOSCOPIC PROCEDURE TODAY AT THE [facility]:   Refer to the procedure report that was given to you for any specific questions about what was found during the examination.  If the procedure report does not answer your questions, please call your gastroenterologist to clarify.  If you requested that your care partner not be given the details of your procedure findings, then the procedure report has been included in a sealed envelope for you to review at your convenience later.  YOU SHOULD EXPECT: Some feelings of bloating in the abdomen. Passage of more gas than usual.  Walking can help get rid of the air that was put into your GI tract during the procedure and reduce the bloating. If you had a lower endoscopy (such as a colonoscopy or flexible sigmoidoscopy) you may notice spotting of blood in your stool or on the toilet paper. If you underwent a bowel prep for your procedure, you may not have a normal bowel movement for a few days.  Please Note:  You might notice some irritation and congestion in your nose or some drainage.  This is from the oxygen used during your procedure.  There is no need for concern and it should clear up in a day or so.  SYMPTOMS TO REPORT IMMEDIATELY:  Following lower endoscopy (colonoscopy or flexible sigmoidoscopy):  Excessive amounts of blood in the stool  Significant tenderness or worsening of abdominal pains  Swelling of the abdomen that is new, acute  Fever of 100F or higher   For urgent or emergent issues, a gastroenterologist can be reached at any hour by calling [phone]. Do not use MyChart messaging for urgent concerns.    DIET:  We do recommend a small meal at first, but then you may proceed to your regular diet.  Drink plenty of fluids but you should avoid alcoholic beverages for 24 hours.  ACTIVITY:  You should plan to take it easy  for the rest of today and you should NOT DRIVE or use heavy machinery until tomorrow (because of the sedation medicines used during the test).    FOLLOW UP: Our staff will call the number listed on your records 48-72 hours following your procedure to check on you and address any questions or concerns that you may have regarding the information given to you following your procedure. If we do not reach you, we will leave a message.  We will attempt to reach you two times.  During this call, we will ask if you have developed any symptoms of COVID 19. If you develop any symptoms (ie: fever, flu-like symptoms, shortness of breath, cough etc.) before then, please call [phone].  If you test positive for Covid 19 in the 2 weeks post procedure, please call and report this information to us.    If any biopsies were taken you will be contacted by phone or by letter within the next 1-3 weeks.  Please call us at [phone] if you have not heard about the biopsies in 3 weeks.    SIGNATURES/CONFIDENTIALITY: You and/or your care partner have signed paperwork which will be entered into your electronic medical record.  These signatures attest to the fact that that the information above on your After Visit Summary has been reviewed and is understood.  Full responsibility of the confidentiality of this discharge information lies with you and/or your care-partner.  

## 2021-05-08 NOTE — Progress Notes (Signed)
Called to room to assist during endoscopic procedure.  Patient ID and intended procedure confirmed with present staff. Received instructions for my participation in the procedure from the performing physician.  

## 2021-05-08 NOTE — Progress Notes (Signed)
VS completed by DT.  Pt's states no medical or surgical changes since previsit or office visit.  

## 2021-05-12 ENCOUNTER — Telehealth: Payer: Self-pay

## 2021-05-12 NOTE — Telephone Encounter (Signed)
  Follow up Call-  Call back number 05/08/2021  Post procedure Call Back phone  # [phone]  Permission to leave phone message Yes  Some recent data might be hidden     Patient questions:  Do you have a fever, pain , or abdominal swelling? No. Pain Score  0 *  Have you tolerated food without any problems? Yes.    Have you been able to return to your normal activities? Yes.    Do you have any questions about your discharge instructions: Diet   No. Medications  No. Follow up visit  No.  Do you have questions or concerns about your Care? No.  Actions: * If pain score is 4 or above: No action needed, pain <4.  Have you developed a fever since your procedure? no  2.   Have you had an respiratory symptoms (SOB or cough) since your procedure? no  3.   Have you tested positive for COVID 19 since your procedure no  4.   Have you had any family members/close contacts diagnosed with the COVID 19 since your procedure?  no   If yes to any of these questions please route to Joylene John, RN and Joella Prince, RN

## 2021-05-13 ENCOUNTER — Ambulatory Visit (INDEPENDENT_AMBULATORY_CARE_PROVIDER_SITE_OTHER): Payer: Medicare Other | Admitting: Internal Medicine

## 2021-05-13 ENCOUNTER — Encounter: Payer: Self-pay | Admitting: Internal Medicine

## 2021-05-13 ENCOUNTER — Other Ambulatory Visit: Payer: Self-pay

## 2021-05-13 VITALS — BP 170/89 | HR 103 | Ht 60.0 in | Wt 140.0 lb

## 2021-05-13 DIAGNOSIS — I10 Essential (primary) hypertension: Secondary | ICD-10-CM

## 2021-05-13 DIAGNOSIS — N186 End stage renal disease: Secondary | ICD-10-CM | POA: Diagnosis not present

## 2021-05-13 DIAGNOSIS — I5022 Chronic systolic (congestive) heart failure: Secondary | ICD-10-CM

## 2021-05-13 DIAGNOSIS — I2721 Secondary pulmonary arterial hypertension: Secondary | ICD-10-CM | POA: Diagnosis not present

## 2021-05-13 NOTE — Progress Notes (Signed)
OFFICE NOTE  Chief Complaint:  Follow-up heart failure  Primary Care Physician: Reynold Bowen, MD  HPI:  Belinda [name] is a 70 y.o. female with a past medial history significant for palpitations and prior PVCs.  She was formerly followed by Dr. Mare Ferrari and Dr. Acie Fredrickson.  Recently in September she presented to [facility] urgent care with shortness of breath and was thought to have a pneumonia.  She was treated with antibiotics however her chest x-ray indicated mild to moderate cardiomegaly and mild congestive heart failure pattern with a superimposed bibasilar infiltrate.  She was instructed to follow-up with cardiology as there was concern for possible new congestive heart failure.  She has a long-standing history of hypertension as well as insulin-dependent diabetes followed by Dr. Forde Dandy.  In 2016 she underwent left knee replacement and had a nuclear stress test prior to that which showed normal perfusion.  She denies any new chest pain.  She does report getting short of breath with exertion.  She also recently has had some wheezing.  Her primary care provider put her on some Symbicort which seems to have helped somewhat with her symptoms as it was felt to be allergic.  She also reports some lower extremity edema.  Additionally, she has stage III chronic kidney disease and is followed by Dr. Hollie Salk with [facility].  05/27/2017  Belinda [name] returns today for follow-up.  She reports her breathing has improved.  We made some adjustments to her medications, including discontinuing hydrochlorothiazide.  She is now on torsemide 20 mg daily.  Echo shows her LVEF at 25-30% as expected. Initially her creatinine was rising somewhat, however, she has diuresed.  Blood pressure remains elevated.  We discussed options for additional heart failure management, including switching her losartan over to [facility].  She may need additional medication for blood pressure as well.  08/30/2017  Mrs.  [name] was seen today in follow-up.  She recently saw Dr. Hollie Salk at [facility].  She was noted to have significant weight gain and acute systolic congestive heart failure.  Her weight had gone up to 279 pounds.  She been struggling with influenza and pneumonia and received steroids and antibiotics.  This likely played a role in her weight gain.  She was advised to increase her torsemide to 20 mg twice daily.  Since then she has had significant diuresis.  Her weight today is down to 167 pounds.  She reports improvement in her breathing.  She is tolerated Entresto with a moderate dose and we discussed possibly increasing that today.  She reports very infrequent episodes of hypotension with systolic blood pressure around 100 but in general her systolic blood pressures are around 135 to 145.  11/25/2017  Belinda [name] returns today for follow-up of heart failure.  She reports that she is not felt as well on the high-dose Entresto.  In fact she started taking the medication once daily.  I reminded her that there is no evidence of benefit on once daily dosing, that I am aware of.  Typically if she is having side effects on the high dose of medication we would recommend decreasing the dose.  She also noted her blood pressure had been elevated although it is only top normal today at 138/90.  She denies any worsening shortness of breath.  Weight is remained stable around 166 pounds.  She does have occasional lower extremity swelling which improves with elevation of her feet.  She is interested in compression stockings.  09/15/2020  Belinda [name] is seen today for follow-up of heart failure.  I last saw her in 2019.  Shortly after that she was seen by Dr. Haroldine Laws in the heart failure clinic and has been followed regularly there.  Unfortunately she is started on dialysis in the interim.  She has been taken off of nearly all of her heart failure medicines except for low-dose losartan.  She struggled with labile  hypertension.  Blood pressure was very high today however at dialysis at times her blood pressure is very low even down to the 28U systolic.  She says that it limits her ultrafiltration as she feels like she is carrying more fluid.  Indeed her weight has been up about 25 pounds since she saw Dr. Haroldine Laws last September.  She does have a dialysis fistula in her right thigh which was placed recently and a temporary catheter which is due to be removed.  She is scheduled to have an echo in April and see Dr. Haroldine Laws thereafter.  05/13/2021  Belinda [name] returns today for follow-up.  She is accompanied by her husband.  She recently graduated from the heart failure clinic.  Her LVEF had recovered up to 55 to 60%.  She had been hospitalized for bacteremia in the summer and underwent echo and TEE which showed no evidence of endocarditis.  Secondarily she developed a volume overload due to not adjusting her dry weight.  Then recently she had to prep for colonoscopy and was again a little volume overloaded.  I suspect all probably related to diastolic dysfunction.  Pressure was high today however she usually takes her meds in the evening and does not need to use midodrine at times prior to her dialysis which is Tuesday, Thursday and Saturday.  PMHx:  Past Medical History:  Diagnosis Date   Acute CHF (congestive heart failure) ([place]) 02/02/2018   Anemia    "get monthly injections to boost my HgB" (02/02/2018)   Anxiety    Asthma    Back pain    intractable low back   Chronic neck pain    CKD (chronic kidney disease), stage III ([place])    on dialysis T/Th/Sa Jeneen Rinks   Depression    Dysrhythmia    palpitations or heart racing periodic. has had for years- Dr Mare Ferrari follows.   GERD (gastroesophageal reflux disease)    History of blood transfusion 2016   After knee replacemnet   Hx MRSA infection    Hyperlipidemia    Hypertension    Migraines    "~ 2/month" (02/02/2018), gets headaches with dialysis    Nerve damage    right hand   Osteoarthritis    "knees, neck" (02/02/2018)   Palpitations    Pneumonia    "twice in 2018" (02/02/2018)   Pulmonary artery hypertension (HCC)    Seasonal allergies    Type II diabetes mellitus (HCC)    insulin dependent - fasting 140-200     Past Surgical History:  Procedure Laterality Date   ABDOMINAL HYSTERECTOMY  03/2001   Abdominal supracervical hysterectomy, left salpingo-oophorectomy   ANTERIOR CERVICAL DECOMP/DISCECTOMY FUSION N/A 01/01/2014   Procedure: CERVICAL THREE-FOUR,CERVICAL FIVE-SIX,CERVICAL SIX-SEVEN ANTERIOR CERVICAL DECOMPRESSION/DISCECTOMY/FUSION;  Surgeon: Kristeen Miss, MD;  Location: [facility];  Service: Neurosurgery;  Laterality: N/A;  left-side approach   AV FISTULA PLACEMENT Right 02/13/2020   Procedure: ARTERIOVENOUS (AV) FISTULA CREATION;  Surgeon: Waynetta Sandy, MD;  Location: [place];  Service: Vascular;  Laterality: Right;   AV FISTULA PLACEMENT Right 08/11/2020  Procedure: INSERTION OF RIGHT THIGH ARTERIOVENOUS (AV) GORE-TEX GRAFT;  Surgeon: Angelia Mould, MD;  Location: [place];  Service: Vascular;  Laterality: Right;   [place] Right 04/16/2020   Procedure: BASCILIC VEIN TRANSPOSITION SECOND STAGE RIGHT;  Surgeon: Waynetta Sandy, MD;  Location: [place];  Service: Vascular;  Laterality: Right;   CATARACT EXTRACTION W/ INTRAOCULAR LENS  IMPLANT, BILATERAL Bilateral 2012   CESAREAN SECTION     COLONOSCOPY     COLONOSCOPY W/ POLYPECTOMY     DILATION AND CURETTAGE OF UTERUS  X 2   ESOPHAGOGASTRODUODENOSCOPY     HAND SURGERY Left 1970s   laceration repair 4th and 5th digits   KNEE ARTHROSCOPY Left    torn mensicus   LIGATION OF COMPETING BRANCHES OF ARTERIOVENOUS FISTULA Right 05/07/2020   Procedure: LIGATION OF RIGHT UPPER EXTREMITY FISTULA;  Surgeon: Waynetta Sandy, MD;  Location: [place];  Service: Vascular;  Laterality: Right;   NASAL SINUS  SURGERY     POLYPECTOMY     RIGHT HEART CATH N/A 06/23/2018   Procedure: RIGHT HEART CATH;  Surgeon: Jolaine Artist, MD;  Location: [place] [facility];  Service: Cardiovascular;  Laterality: N/A;   TEE WITHOUT CARDIOVERSION N/A 01/22/2021   Procedure: TRANSESOPHAGEAL ECHOCARDIOGRAM (TEE);  Surgeon: Skeet Latch, MD;  Location: [place];  Service: Cardiovascular;  Laterality: N/A;   TOTAL KNEE ARTHROPLASTY Left 02/12/2015   Procedure: TOTAL KNEE ARTHROPLASTY;  Surgeon: [name], MD;  Location: [place];  Service: Orthopedics;  Laterality: Left;   UPPER GASTROINTESTINAL ENDOSCOPY      FAMHx:  Family History  Problem Relation Age of Onset   Heart disease Mother    Alzheimer's disease Mother    Heart failure Father    Colon cancer Neg Hx    Stomach cancer Neg Hx    Esophageal cancer Neg Hx     SOCHx:   reports that she has never smoked. She has never used smokeless tobacco. She reports that she does not drink alcohol and does not use drugs.  ALLERGIES:  Allergies  Allergen Reactions   Doxycycline Other (See Comments)   Entresto [Sacubitril-Valsartan] Itching   Metformin Other (See Comments)    weakness   Omnicef [Cefdinir] Other (See Comments)    GI Upset   Aspirin Other (See Comments)    GI issues, Can tolerate low aspirin   Augmentin [Amoxicillin-Pot Clavulanate] Nausea And Vomiting and Other (See Comments)    GI issues DID THE REACTION INVOLVE: Swelling of the face/tongue/throat, SOB, or low BP? No Sudden or severe rash/hives, skin peeling, or the inside of the mouth or nose? No Did it require medical treatment? No When did it last happen?      long time  If all above answers are "NO", may proceed with cephalosporin use.    Erythromycin Nausea And Vomiting and Other (See Comments)    Gi issues   Gabapentin Swelling   Nickel Itching and Rash    Breakouts     ROS: Pertinent items noted in HPI and remainder of comprehensive ROS otherwise  negative.  HOME MEDS: Current Outpatient Medications on File Prior to Visit  Medication Sig Dispense Refill   acetaminophen (TYLENOL) 650 MG CR tablet Take 650-1,300 mg by mouth every 8 (eight) hours as needed for pain (pain).      Alpha-D-Galactosidase (BEANO PO) Take 1-2 tablets by mouth daily as needed (gas). With certain foods.     ALPRAZolam (  XANAX) 0.5 MG tablet Take 0.25-0.5 mg by mouth 2 (two) times daily as needed for anxiety.     aspirin EC 81 MG tablet Take 81 mg by mouth every Monday, Wednesday, and Friday. Swallow whole.     Azelastine HCl 0.15 % SOLN Place 2 sprays into both nostrils daily.     budesonide-formoterol (SYMBICORT) 160-4.5 MCG/ACT inhaler Inhale 2 puffs into the lungs 2 (two) times daily as needed (cough/respiratory issues.).      diclofenac sodium (VOLTAREN) 1 % GEL Apply 1 application topically 4 (four) times daily as needed (back and knee).     Difelikefalin Acetate (KORSUVA IV) Inject into the vein.     ezetimibe-simvastatin (VYTORIN) 10-40 MG per tablet Take 1 tablet by mouth at bedtime.      ferric citrate (AURYXIA) 1 GM 210 MG(Fe) tablet Take 210-420 mg by mouth See admin instructions. Take 2 tablets (420 mg) by mouth 3 times daily with meals & take 1 tablet (210 mg) by mouth with each snack     fexofenadine (ALLEGRA) 180 MG tablet Take 180 mg by mouth daily.     guaiFENesin (MUCINEX) 600 MG 12 hr tablet Take 600 mg by mouth in the morning and at bedtime.      insulin lispro (HUMALOG) 100 UNIT/ML injection Inject 4-10 Units into the skin 3 (three) times daily before meals. Sliding Scale     LANTUS SOLOSTAR 100 UNIT/ML Solostar Pen Inject 18 Units into the skin at bedtime.   3   losartan (COZAAR) 25 MG tablet Take 12.5 mg by mouth daily.     Magnesium 200 MG TABS Take 0.5 tablets (100 mg total) by mouth 2 (two) times daily.     midodrine (PROAMATINE) 10 MG tablet Take 10 mg by mouth every Tuesday, Thursday, and Saturday at 6 PM. Pre dialysis     multivitamin  (RENA-VIT) TABS tablet Take 1 tablet by mouth daily with breakfast.      nitroGLYCERIN (NITROSTAT) 0.4 MG SL tablet Place 1 tablet (0.4 mg total) under the tongue every 5 (five) minutes x 3 doses as needed for chest pain. 6 tablet 1   olopatadine (PATANOL) 0.1 % ophthalmic solution Place 1 drop into both eyes daily as needed (dry itchy eyes).     simethicone (MYLICON) 80 MG chewable tablet Chew 160 mg by mouth daily as needed for flatulence (indigestion.).      VITAMIN D, CHOLECALCIFEROL, PO Take 1 tablet by mouth Every Tuesday,Thursday,and Saturday with dialysis.     No current facility-administered medications on file prior to visit.    LABS/IMAGING: No results found for this or any previous visit (from the past 48 hour(s)). No results found.  LIPID PANEL: No results found for: CHOL, TRIG, HDL, CHOLHDL, VLDL, LDLCALC, LDLDIRECT   WEIGHTS: Wt Readings from Last 3 Encounters:  05/13/21 140 lb (63.5 kg)  05/08/21 142 lb (64.4 kg)  04/08/21 142 lb (64.4 kg)    VITALS: Ht 5' (1.524 m)   Wt 140 lb (63.5 kg)   BMI 27.34 kg/m   EXAM: General appearance: alert and no distress Neck: no carotid bruit, no JVD and thyroid not enlarged, symmetric, no tenderness/mass/nodules Lungs: clear to auscultation bilaterally Heart: regular rate and rhythm Abdomen: soft, non-tender; bowel sounds normal; no masses,  no organomegaly Extremities: edema 1+ edema Pulses: 2+ and symmetric Skin: Skin color, texture, turgor normal. No rashes or lesions Neurologic: Grossly normal Psych: Pleasant  EKG: Sinus tachycardia at 103-personally reviewed  ASSESSMENT: Chronic systolic congestive heart  failure, NYHA class II symptoms-LVEF 25-30%, now up to 60-65% (12/2020) ESRD on HD (right thigh fistula) Insulin-dependent diabetes - last A1c 9.3% Dyslipidemia Anemia Palpitations/PVCs  PLAN: 1.   Belinda [name] has had improvement in LV function up to 60 to 65%.  TEE over the summer when she was bacteremic  showed no evidence of any vegetation.  She has had some issues with volume overload which is handled at dialysis.  She is graduated from the heart failure clinic.  Blood pressure is elevated today however she usually takes her medicine in the evening.  She says her home readings are much better.  In fact she has to take midodrine on the days of dialysis which would be tomorrow.  No medicine changes today.  She is trying to get a kidney transplant.  We will plan follow-up in 6 months or sooner as necessary.  Pixie Casino, MD, [facility], [place] Director of the Advanced Lipid Disorders &  Cardiovascular Risk Reduction Clinic Attending Cardiologist  Direct Dial: [phone]  Fax: [phone]  Website:  [email or web address] 05/13/2021, 3:37 PM

## 2021-05-13 NOTE — Patient Instructions (Signed)
Medication Instructions:  Continue same medications *If you need a refill on your cardiac medications before your next appointment, please call your pharmacy*   Lab Work: None ordered   Testing/Procedures: None ordered   Follow-Up: At [facility], you and your health needs are our priority.  As part of our continuing mission to provide you with exceptional heart care, we have created designated Provider Care Teams.  These Care Teams include your primary Cardiologist (physician) and Advanced Practice Providers (APPs -  Physician Assistants and Nurse Practitioners) who all work together to provide you with the care you need, when you need it.  We recommend signing up for the patient portal called "MyChart".  Sign up information is provided on this After Visit Summary.  MyChart is used to connect with patients for Virtual Visits (Telemedicine).  Patients are able to view lab/test results, encounter notes, upcoming appointments, etc.  Non-urgent messages can be sent to your provider as well.   To learn more about what you can do with MyChart, go to [email or web address].      Your next appointment:  6 months    The format for your next appointment: Office   Provider:  Dr.Hilty

## 2022-05-10 ENCOUNTER — Ambulatory Visit: Payer: Medicare Other | Attending: Internal Medicine | Admitting: Internal Medicine

## 2022-05-10 ENCOUNTER — Encounter: Payer: Self-pay | Admitting: Internal Medicine

## 2022-05-10 VITALS — BP 182/95 | HR 93 | Ht 60.0 in | Wt 158.6 lb

## 2022-05-10 DIAGNOSIS — I5022 Chronic systolic (congestive) heart failure: Secondary | ICD-10-CM

## 2022-05-10 DIAGNOSIS — N186 End stage renal disease: Secondary | ICD-10-CM | POA: Diagnosis not present

## 2022-05-10 DIAGNOSIS — I10 Essential (primary) hypertension: Secondary | ICD-10-CM | POA: Diagnosis not present

## 2022-05-10 NOTE — Progress Notes (Signed)
OFFICE NOTE  Chief Complaint:  Follow-up heart failure  Primary Care Physician: Reynold Bowen, MD  HPI:  Belinda [name] is a 71 y.o. female with a past medial history significant for palpitations and prior PVCs.  She was formerly followed by Dr. Mare Ferrari and Dr. Acie Fredrickson.  Recently in September she presented to [facility] urgent care with shortness of breath and was thought to have a pneumonia.  She was treated with antibiotics however her chest x-ray indicated mild to moderate cardiomegaly and mild congestive heart failure pattern with a superimposed bibasilar infiltrate.  She was instructed to follow-up with cardiology as there was concern for possible new congestive heart failure.  She has a long-standing history of hypertension as well as insulin-dependent diabetes followed by Dr. Forde Dandy.  In 2016 she underwent left knee replacement and had a nuclear stress test prior to that which showed normal perfusion.  She denies any new chest pain.  She does report getting short of breath with exertion.  She also recently has had some wheezing.  Her primary care provider put her on some Symbicort which seems to have helped somewhat with her symptoms as it was felt to be allergic.  She also reports some lower extremity edema.  Additionally, she has stage III chronic kidney disease and is followed by Dr. Hollie Salk with [facility].  05/27/2017  Belinda [name] returns today for follow-up.  She reports her breathing has improved.  We made some adjustments to her medications, including discontinuing hydrochlorothiazide.  She is now on torsemide 20 mg daily.  Echo shows her LVEF at 25-30% as expected. Initially her creatinine was rising somewhat, however, she has diuresed.  Blood pressure remains elevated.  We discussed options for additional heart failure management, including switching her losartan over to [facility].  She may need additional medication for blood pressure as well.  08/30/2017  Mrs.  [name] was seen today in follow-up.  She recently saw Dr. Hollie Salk at [facility].  She was noted to have significant weight gain and acute systolic congestive heart failure.  Her weight had gone up to 279 pounds.  She been struggling with influenza and pneumonia and received steroids and antibiotics.  This likely played a role in her weight gain.  She was advised to increase her torsemide to 20 mg twice daily.  Since then she has had significant diuresis.  Her weight today is down to 167 pounds.  She reports improvement in her breathing.  She is tolerated Entresto with a moderate dose and we discussed possibly increasing that today.  She reports very infrequent episodes of hypotension with systolic blood pressure around 100 but in general her systolic blood pressures are around 135 to 145.  11/25/2017  Belinda [name] returns today for follow-up of heart failure.  She reports that she is not felt as well on the high-dose Entresto.  In fact she started taking the medication once daily.  I reminded her that there is no evidence of benefit on once daily dosing, that I am aware of.  Typically if she is having side effects on the high dose of medication we would recommend decreasing the dose.  She also noted her blood pressure had been elevated although it is only top normal today at 138/90.  She denies any worsening shortness of breath.  Weight is remained stable around 166 pounds.  She does have occasional lower extremity swelling which improves with elevation of her feet.  She is interested in compression stockings.  09/15/2020  Belinda [name] is seen today for follow-up of heart failure.  I last saw her in 2019.  Shortly after that she was seen by Dr. Haroldine Laws in the heart failure clinic and has been followed regularly there.  Unfortunately she is started on dialysis in the interim.  She has been taken off of nearly all of her heart failure medicines except for low-dose losartan.  She struggled with labile  hypertension.  Blood pressure was very high today however at dialysis at times her blood pressure is very low even down to the 66Q systolic.  She says that it limits her ultrafiltration as she feels like she is carrying more fluid.  Indeed her weight has been up about 25 pounds since she saw Dr. Haroldine Laws last September.  She does have a dialysis fistula in her right thigh which was placed recently and a temporary catheter which is due to be removed.  She is scheduled to have an echo in April and see Dr. Haroldine Laws thereafter.  05/13/2021  Belinda [name] returns today for follow-up.  She is accompanied by her husband.  She recently graduated from the heart failure clinic.  Her LVEF had recovered up to 55 to 60%.  She had been hospitalized for bacteremia in the summer and underwent echo and TEE which showed no evidence of endocarditis.  Secondarily she developed a volume overload due to not adjusting her dry weight.  Then recently she had to prep for colonoscopy and was again a little volume overloaded.  I suspect all probably related to diastolic dysfunction.  Pressure was high today however she usually takes her meds in the evening and does not need to use midodrine at times prior to her dialysis which is Tuesday, Thursday and Saturday.  05/10/2022  Belinda [name] is seen today in follow-up.  Overall she seems to be doing well.  Her blood pressure is quite elevated today 182/95.  She tells me that her losartan was discontinued because she has issues with low blood pressure, which is most notable on dialysis days.  She may have to take midodrine anywhere between 10 to 20 mg on dialysis days.  Blood pressure appears to be somewhat labile and is difficult to determine whether she should be on additional blood pressure lowering therapy.  Generally in the office, however blood pressure is quite high.  EKG shows a normal sinus rhythm today.  She denies any chest pain or shortness of breath.  PMHx:  Past Medical  History:  Diagnosis Date   Acute CHF (congestive heart failure) ([place]) 02/02/2018   Anemia    "get monthly injections to boost my HgB" (02/02/2018)   Anxiety    Asthma    Back pain    intractable low back   Chronic neck pain    CKD (chronic kidney disease), stage III ([place])    on dialysis T/Th/Sa Jeneen Rinks   Depression    Dysrhythmia    palpitations or heart racing periodic. has had for years- Dr Mare Ferrari follows.   GERD (gastroesophageal reflux disease)    History of blood transfusion 2016   After knee replacemnet   Hx MRSA infection    Hyperlipidemia    Hypertension    Migraines    "~ 2/month" (02/02/2018), gets headaches with dialysis   Nerve damage    right hand   Osteoarthritis    "knees, neck" (02/02/2018)   Palpitations    Pneumonia    "twice in 2018" (02/02/2018)   Pulmonary artery hypertension ([place])  Seasonal allergies    Type II diabetes mellitus (HCC)    insulin dependent - fasting 140-200     Past Surgical History:  Procedure Laterality Date   ABDOMINAL HYSTERECTOMY  03/2001   Abdominal supracervical hysterectomy, left salpingo-oophorectomy   ANTERIOR CERVICAL DECOMP/DISCECTOMY FUSION N/A 01/01/2014   Procedure: CERVICAL THREE-FOUR,CERVICAL FIVE-SIX,CERVICAL SIX-SEVEN ANTERIOR CERVICAL DECOMPRESSION/DISCECTOMY/FUSION;  Surgeon: Kristeen Miss, MD;  Location: [facility];  Service: Neurosurgery;  Laterality: N/A;  left-side approach   AV FISTULA PLACEMENT Right 02/13/2020   Procedure: ARTERIOVENOUS (AV) FISTULA CREATION;  Surgeon: Waynetta Sandy, MD;  Location: [place];  Service: Vascular;  Laterality: Right;   AV FISTULA PLACEMENT Right 08/11/2020   Procedure: INSERTION OF RIGHT THIGH ARTERIOVENOUS (AV) GORE-TEX GRAFT;  Surgeon: Angelia Mould, MD;  Location: [place];  Service: Vascular;  Laterality: Right;   [place] Right 04/16/2020   Procedure: BASCILIC VEIN TRANSPOSITION SECOND STAGE RIGHT;  Surgeon: Waynetta Sandy, MD;  Location: [place];  Service: Vascular;  Laterality: Right;   CATARACT EXTRACTION W/ INTRAOCULAR LENS  IMPLANT, BILATERAL Bilateral 2012   CESAREAN SECTION     COLONOSCOPY     COLONOSCOPY W/ POLYPECTOMY     DILATION AND CURETTAGE OF UTERUS  X 2   ESOPHAGOGASTRODUODENOSCOPY     HAND SURGERY Left 1970s   laceration repair 4th and 5th digits   KNEE ARTHROSCOPY Left    torn mensicus   LIGATION OF COMPETING BRANCHES OF ARTERIOVENOUS FISTULA Right 05/07/2020   Procedure: LIGATION OF RIGHT UPPER EXTREMITY FISTULA;  Surgeon: Waynetta Sandy, MD;  Location: [facility];  Service: Vascular;  Laterality: Right;   NASAL SINUS SURGERY     POLYPECTOMY     RIGHT HEART CATH N/A 06/23/2018   Procedure: RIGHT HEART CATH;  Surgeon: Jolaine Artist, MD;  Location: [facility];  Service: Cardiovascular;  Laterality: N/A;   TEE WITHOUT CARDIOVERSION N/A 01/22/2021   Procedure: TRANSESOPHAGEAL ECHOCARDIOGRAM (TEE);  Surgeon: Skeet Latch, MD;  Location: [place];  Service: Cardiovascular;  Laterality: N/A;   TOTAL KNEE ARTHROPLASTY Left 02/12/2015   Procedure: TOTAL KNEE ARTHROPLASTY;  Surgeon: [name], MD;  Location: [place];  Service: Orthopedics;  Laterality: Left;   UPPER GASTROINTESTINAL ENDOSCOPY      FAMHx:  Family History  Problem Relation Age of Onset   Heart disease Mother    Alzheimer's disease Mother    Heart failure Father    Colon cancer Neg Hx    Stomach cancer Neg Hx    Esophageal cancer Neg Hx     SOCHx:   reports that she has never smoked. She has never used smokeless tobacco. She reports that she does not drink alcohol and does not use drugs.  ALLERGIES:  Allergies  Allergen Reactions   Doxycycline Other (See Comments)   Entresto [Sacubitril-Valsartan] Itching   Metformin Other (See Comments)    weakness   Omnicef [Cefdinir] Other (See Comments)    GI Upset   Aspirin Other (See Comments)    GI issues, Can tolerate low  aspirin   Augmentin [Amoxicillin-Pot Clavulanate] Nausea And Vomiting and Other (See Comments)    GI issues DID THE REACTION INVOLVE: Swelling of the face/tongue/throat, SOB, or low BP? No Sudden or severe rash/hives, skin peeling, or the inside of the mouth or nose? No Did it require medical treatment? No When did it last happen?      long time  If all above  answers are "NO", may proceed with cephalosporin use.    Erythromycin Nausea And Vomiting and Other (See Comments)    Gi issues   Gabapentin Swelling   Nickel Itching and Rash    Breakouts     ROS: Pertinent items noted in HPI and remainder of comprehensive ROS otherwise negative.  HOME MEDS: Current Outpatient Medications on File Prior to Visit  Medication Sig Dispense Refill   acetaminophen (TYLENOL) 650 MG CR tablet Take 650-1,300 mg by mouth every 8 (eight) hours as needed for pain (pain).      Alpha-D-Galactosidase (BEANO PO) Take 1-2 tablets by mouth daily as needed (gas). With certain foods.     aspirin EC 81 MG tablet Take 81 mg by mouth every Monday, Wednesday, and Friday. Swallow whole.     Azelastine HCl 0.15 % SOLN Place 2 sprays into both nostrils daily.     budesonide-formoterol (SYMBICORT) 160-4.5 MCG/ACT inhaler Inhale 2 puffs into the lungs 2 (two) times daily as needed (cough/respiratory issues.).      diclofenac sodium (VOLTAREN) 1 % GEL Apply 1 application topically 4 (four) times daily as needed (back and knee).     Difelikefalin Acetate (KORSUVA IV) Inject into the vein.     ezetimibe-simvastatin (VYTORIN) 10-40 MG per tablet Take 1 tablet by mouth at bedtime.      ferric citrate (AURYXIA) 1 GM 210 MG(Fe) tablet Take 210-420 mg by mouth See admin instructions. Take 2 tablets (420 mg) by mouth 3 times daily with meals & take 1 tablet (210 mg) by mouth with each snack     fexofenadine (ALLEGRA) 180 MG tablet Take 180 mg by mouth daily.     Fluticasone-Umeclidin-Vilant (TRELEGY ELLIPTA) 100-62.5-25 MCG/ACT AEPB  Inhale 1 puff into the lungs daily as needed.     guaiFENesin (MUCINEX) 600 MG 12 hr tablet Take 600 mg by mouth in the morning and at bedtime.      insulin lispro (HUMALOG) 100 UNIT/ML injection Inject 4-10 Units into the skin 3 (three) times daily before meals. Sliding Scale     LANTUS SOLOSTAR 100 UNIT/ML Solostar Pen Inject 22 Units into the skin at bedtime.  3   lidocaine-prilocaine (EMLA) cream Apply topically.     losartan (COZAAR) 25 MG tablet Take 12.5 mg by mouth daily.     Magnesium 200 MG TABS Take 0.5 tablets (100 mg total) by mouth 2 (two) times daily.     Methoxy PEG-Epoetin Beta (MIRCERA IJ) Mircera     midodrine (PROAMATINE) 10 MG tablet Take 10 mg by mouth every Tuesday, Thursday, and Saturday at 6 PM. Pre dialysis     multivitamin (RENA-VIT) TABS tablet Take 1 tablet by mouth daily with breakfast.      nitroGLYCERIN (NITROSTAT) 0.4 MG SL tablet Place 1 tablet (0.4 mg total) under the tongue every 5 (five) minutes x 3 doses as needed for chest pain. 6 tablet 1   olopatadine (PATANOL) 0.1 % ophthalmic solution Place 1 drop into both eyes daily as needed (dry itchy eyes).     simethicone (MYLICON) 80 MG chewable tablet Chew 160 mg by mouth daily as needed for flatulence (indigestion.).      VITAMIN D, CHOLECALCIFEROL, PO Take 1 tablet by mouth Every Tuesday,Thursday,and Saturday with dialysis.     No current facility-administered medications on file prior to visit.    LABS/IMAGING: No results found for this or any previous visit (from the past 48 hour(s)). No results found.  LIPID PANEL: No results found for: "  CHOL", "TRIG", "HDL", "CHOLHDL", "VLDL", "LDLCALC", "LDLDIRECT"   WEIGHTS: Wt Readings from Last 3 Encounters:  05/10/22 158 lb 9.6 oz (71.9 kg)  05/13/21 140 lb (63.5 kg)  05/08/21 142 lb (64.4 kg)    VITALS: BP (!) 182/95 (BP Location: Left Arm, Patient Position: Sitting)   Pulse 93   Ht 5' (1.524 m)   Wt 158 lb 9.6 oz (71.9 kg)   SpO2 100%   BMI 30.97  kg/m   EXAM: General appearance: alert and no distress Neck: no carotid bruit, no JVD and thyroid not enlarged, symmetric, no tenderness/mass/nodules Lungs: clear to auscultation bilaterally Heart: regular rate and rhythm Abdomen: soft, non-tender; bowel sounds normal; no masses,  no organomegaly Extremities: edema 1+ edema Pulses: 2+ and symmetric Skin: Skin color, texture, turgor normal. No rashes or lesions Neurologic: Grossly normal Psych: Pleasant  EKG: Normal sinus rhythm at 93- personally reviewed  ASSESSMENT: Chronic systolic congestive heart failure, NYHA class II symptoms-LVEF 25-30%, now up to 60-65% (12/2020) ESRD on HD (right thigh fistula) Insulin-dependent diabetes - last A1c 9.3% Dyslipidemia Anemia Palpitations/PVCs  PLAN: 1.   Belinda [name] as most recently had normal LV function by echo last year.  Blood pressure apparently is labile.  She is no longer on losartan but does take midodrine on dialysis days.  She says her blood pressure runs higher when they do not have to take any fluid off which is happened the last 3 dialysis sessions.  I still think she is hypertensive however it does seem that there are times where she is hypotensive by her report.  For now we will not add back any blood pressure medicines.  Follow-up with me in 6 months or sooner as necessary.  Pixie Casino, MD, [facility], [place] Director of the Advanced Lipid Disorders &  Cardiovascular Risk Reduction Clinic Attending Cardiologist  Direct Dial: [phone]  Fax: [phone]  Website:  [email or web address] Mildreth Reek 05/10/2022, 3:09 PM

## 2022-05-10 NOTE — Patient Instructions (Signed)
Medication Instructions:  The current medical regimen is effective;  continue present plan and medications.  *If you need a refill on your cardiac medications before your next appointment, please call your pharmacy*   Follow-Up: At [facility], you and your health needs are our priority.  As part of our continuing mission to provide you with exceptional heart care, we have created designated Provider Care Teams.  These Care Teams include your primary Cardiologist (physician) and Advanced Practice Providers (APPs -  Physician Assistants and Nurse Practitioners) who all work together to provide you with the care you need, when you need it.  We recommend signing up for the patient portal called "MyChart".  Sign up information is provided on this After Visit Summary.  MyChart is used to connect with patients for Virtual Visits (Telemedicine).  Patients are able to view lab/test results, encounter notes, upcoming appointments, etc.  Non-urgent messages can be sent to your provider as well.   To learn more about what you can do with MyChart, go to [email or web address].    Your next appointment:   6 month(s)  The format for your next appointment:   In Person  Provider:   Pixie Casino, MD

## 2023-02-07 ENCOUNTER — Other Ambulatory Visit: Payer: Self-pay | Admitting: Endocrinology

## 2023-02-07 DIAGNOSIS — Z1231 Encounter for screening mammogram for malignant neoplasm of breast: Secondary | ICD-10-CM

## 2023-03-21 ENCOUNTER — Ambulatory Visit: Payer: Medicare Other

## 2023-03-21 ENCOUNTER — Ambulatory Visit
Admission: RE | Admit: 2023-03-21 | Discharge: 2023-03-21 | Disposition: A | Discharge status: Home / Self Care | Payer: Medicare Other | Source: Ambulatory Visit | Attending: Endocrinology | Admitting: Endocrinology

## 2023-03-21 DIAGNOSIS — Z1231 Encounter for screening mammogram for malignant neoplasm of breast: Secondary | ICD-10-CM

## 2023-04-22 ENCOUNTER — Ambulatory Visit: Payer: Medicare Other | Attending: Internal Medicine | Admitting: Internal Medicine

## 2023-04-22 ENCOUNTER — Ambulatory Visit: Payer: Medicare Other | Admitting: Internal Medicine

## 2023-04-22 ENCOUNTER — Encounter: Payer: Self-pay | Admitting: Internal Medicine

## 2023-04-22 VITALS — BP 140/74 | HR 90 | Ht 60.0 in | Wt 155.4 lb

## 2023-04-22 DIAGNOSIS — I493 Ventricular premature depolarization: Secondary | ICD-10-CM | POA: Diagnosis not present

## 2023-04-22 DIAGNOSIS — I10 Essential (primary) hypertension: Secondary | ICD-10-CM | POA: Diagnosis not present

## 2023-04-22 DIAGNOSIS — I5030 Unspecified diastolic (congestive) heart failure: Secondary | ICD-10-CM | POA: Diagnosis not present

## 2023-04-22 DIAGNOSIS — N186 End stage renal disease: Secondary | ICD-10-CM | POA: Diagnosis not present

## 2023-04-22 NOTE — Patient Instructions (Signed)
Medication Instructions:  No change  *If you need a refill on your cardiac medications before your next appointment, please call your pharmacy*   Follow-Up: At [facility], you and your health needs are our priority.  As part of our continuing mission to provide you with exceptional heart care, we have created designated Provider Care Teams.  These Care Teams include your primary Cardiologist (physician) and Advanced Practice Providers (APPs -  Physician Assistants and Nurse Practitioners) who all work together to provide you with the care you need, when you need it.  We recommend signing up for the patient portal called "MyChart".  Sign up information is provided on this After Visit Summary.  MyChart is used to connect with patients for Virtual Visits (Telemedicine).  Patients are able to view lab/test results, encounter notes, upcoming appointments, etc.  Non-urgent messages can be sent to your provider as well.   To learn more about what you can do with MyChart, go to [email or web address].    Your next appointment:   1 year(s)  Provider:   Chrystie Nose, MD

## 2023-04-22 NOTE — Progress Notes (Unsigned)
OFFICE NOTE  Chief Complaint:  Follow-up heart failure  Primary Care Physician: Adrian Prince, MD  HPI:  Belinda [name] is a 72 y.o. female with a past medial history significant for palpitations and prior PVCs.  She was formerly followed by Dr. Patty Sermons and Dr. Elease Hashimoto.  Recently in September she presented to [facility] urgent care with shortness of breath and was thought to have a pneumonia.  She was treated with antibiotics however her chest x-ray indicated mild to moderate cardiomegaly and mild congestive heart failure pattern with a superimposed bibasilar infiltrate.  She was instructed to follow-up with cardiology as there was concern for possible new congestive heart failure.  She has a long-standing history of hypertension as well as insulin-dependent diabetes followed by Dr. Evlyn Kanner.  In 2016 she underwent left knee replacement and had a nuclear stress test prior to that which showed normal perfusion.  She denies any new chest pain.  She does report getting short of breath with exertion.  She also recently has had some wheezing.  Her primary care provider put her on some Symbicort which seems to have helped somewhat with her symptoms as it was felt to be allergic.  She also reports some lower extremity edema.  Additionally, she has stage III chronic kidney disease and is followed by Dr. Signe Colt with [facility].  05/27/2017  Belinda [name] returns today for follow-up.  She reports her breathing has improved.  We made some adjustments to her medications, including discontinuing hydrochlorothiazide.  She is now on torsemide 20 mg daily.  Echo shows her LVEF at 25-30% as expected. Initially her creatinine was rising somewhat, however, she has diuresed.  Blood pressure remains elevated.  We discussed options for additional heart failure management, including switching her losartan over to [facility].  She may need additional medication for blood pressure as well.  08/30/2017  Mrs.  [name] was seen today in follow-up.  She recently saw Dr. Signe Colt at [facility].  She was noted to have significant weight gain and acute systolic congestive heart failure.  Her weight had gone up to 279 pounds.  She been struggling with influenza and pneumonia and received steroids and antibiotics.  This likely played a role in her weight gain.  She was advised to increase her torsemide to 20 mg twice daily.  Since then she has had significant diuresis.  Her weight today is down to 167 pounds.  She reports improvement in her breathing.  She is tolerated Entresto with a moderate dose and we discussed possibly increasing that today.  She reports very infrequent episodes of hypotension with systolic blood pressure around 100 but in general her systolic blood pressures are around 135 to 145.  11/25/2017  Belinda [name] returns today for follow-up of heart failure.  She reports that she is not felt as well on the high-dose Entresto.  In fact she started taking the medication once daily.  I reminded her that there is no evidence of benefit on once daily dosing, that I am aware of.  Typically if she is having side effects on the high dose of medication we would recommend decreasing the dose.  She also noted her blood pressure had been elevated although it is only top normal today at 138/90.  She denies any worsening shortness of breath.  Weight is remained stable around 166 pounds.  She does have occasional lower extremity swelling which improves with elevation of her feet.  She is interested in compression stockings.  09/15/2020  Belinda [name] is seen today for follow-up of heart failure.  I last saw her in 2019.  Shortly after that she was seen by Dr. Gala Romney in the heart failure clinic and has been followed regularly there.  Unfortunately she is started on dialysis in the interim.  She has been taken off of nearly all of her heart failure medicines except for low-dose losartan.  She struggled with labile  hypertension.  Blood pressure was very high today however at dialysis at times her blood pressure is very low even down to the 70s systolic.  She says that it limits her ultrafiltration as she feels like she is carrying more fluid.  Indeed her weight has been up about 25 pounds since she saw Dr. Gala Romney last September.  She does have a dialysis fistula in her right thigh which was placed recently and a temporary catheter which is due to be removed.  She is scheduled to have an echo in April and see Dr. Gala Romney thereafter.  05/13/2021  Belinda [name] returns today for follow-up.  She is accompanied by her husband.  She recently graduated from the heart failure clinic.  Her LVEF had recovered up to 55 to 60%.  She had been hospitalized for bacteremia in the summer and underwent echo and TEE which showed no evidence of endocarditis.  Secondarily she developed a volume overload due to not adjusting her dry weight.  Then recently she had to prep for colonoscopy and was again a little volume overloaded.  I suspect all probably related to diastolic dysfunction.  Pressure was high today however she usually takes her meds in the evening and does not need to use midodrine at times prior to her dialysis which is Tuesday, Thursday and Saturday.  05/10/2022  Belinda [name] is seen today in follow-up.  Overall she seems to be doing well.  Her blood pressure is quite elevated today 182/95.  She tells me that her losartan was discontinued because she has issues with low blood pressure, which is most notable on dialysis days.  She may have to take midodrine anywhere between 10 to 20 mg on dialysis days.  Blood pressure appears to be somewhat labile and is difficult to determine whether she should be on additional blood pressure lowering therapy.  Generally in the office, however blood pressure is quite high.  EKG shows a normal sinus rhythm today.  She denies any chest pain or shortness of breath.  04/22/2023  Belinda [name]  returns today for follow-up.  Overall she says she is feeling well.  She continues on hemodialysis on Tuesday, Thursday and Saturday.  Unfortunately she was not considered a candidate for renal transplant.  She has had issues with low blood pressure alternating with high blood pressure.  In fact she is on no hypertensive medications rather she has to take midodrine on dialysis days.  This has been going on for some time.  She appears to be euvolemic and has had well-controlled volume status.  Her last echo several years ago showed normalization of LV function.  PMHx:  Past Medical History:  Diagnosis Date   Acute CHF (congestive heart failure) (HCC) 02/02/2018   Anemia    "get monthly injections to boost my HgB" (02/02/2018)   Anxiety    Asthma    Back pain    intractable low back   Chronic neck pain    CKD (chronic kidney disease), stage III (HCC)    on dialysis T/Th/Sa [facility]   Depression  Dysrhythmia    palpitations or heart racing periodic. has had for years- Dr Patty Sermons follows.   GERD (gastroesophageal reflux disease)    History of blood transfusion 2016   After knee replacemnet   Hx MRSA infection    Hyperlipidemia    Hypertension    Migraines    "~ 2/month" (02/02/2018), gets headaches with dialysis   Nerve damage    right hand   Osteoarthritis    "knees, neck" (02/02/2018)   Palpitations    Pneumonia    "twice in 2018" (02/02/2018)   Pulmonary artery hypertension (HCC)    Seasonal allergies    Type II diabetes mellitus (HCC)    insulin dependent - fasting 140-200     Past Surgical History:  Procedure Laterality Date   ABDOMINAL HYSTERECTOMY  03/2001   Abdominal supracervical hysterectomy, left salpingo-oophorectomy   ANTERIOR CERVICAL DECOMP/DISCECTOMY FUSION N/A 01/01/2014   Procedure: CERVICAL THREE-FOUR,CERVICAL FIVE-SIX,CERVICAL SIX-SEVEN ANTERIOR CERVICAL DECOMPRESSION/DISCECTOMY/FUSION;  Surgeon: Barnett Abu, MD;  Location: [facility] NEURO ORS;  Service:  Neurosurgery;  Laterality: N/A;  left-side approach   AV FISTULA PLACEMENT Right 02/13/2020   Procedure: ARTERIOVENOUS (AV) FISTULA CREATION;  Surgeon: Maeola Harman, MD;  Location: [facility] OR;  Service: Vascular;  Laterality: Right;   AV FISTULA PLACEMENT Right 08/11/2020   Procedure: INSERTION OF RIGHT THIGH ARTERIOVENOUS (AV) GORE-TEX GRAFT;  Surgeon: Chuck Hint, MD;  Location: [facility] OR;  Service: Vascular;  Laterality: Right;   BACK SURGERY     BASCILIC VEIN TRANSPOSITION Right 04/16/2020   Procedure: BASCILIC VEIN TRANSPOSITION SECOND STAGE RIGHT;  Surgeon: Maeola Harman, MD;  Location: [facility] OR;  Service: Vascular;  Laterality: Right;   CATARACT EXTRACTION W/ INTRAOCULAR LENS  IMPLANT, BILATERAL Bilateral 2012   CESAREAN SECTION     COLONOSCOPY     COLONOSCOPY W/ POLYPECTOMY     DILATION AND CURETTAGE OF UTERUS  X 2   ESOPHAGOGASTRODUODENOSCOPY     HAND SURGERY Left 1970s   laceration repair 4th and 5th digits   KNEE ARTHROSCOPY Left    torn mensicus   LIGATION OF COMPETING BRANCHES OF ARTERIOVENOUS FISTULA Right 05/07/2020   Procedure: LIGATION OF RIGHT UPPER EXTREMITY FISTULA;  Surgeon: Maeola Harman, MD;  Location: [facility] OR;  Service: Vascular;  Laterality: Right;   NASAL SINUS SURGERY     POLYPECTOMY     RIGHT HEART CATH N/A 06/23/2018   Procedure: RIGHT HEART CATH;  Surgeon: Dolores Patty, MD;  Location: [facility] INVASIVE CV LAB;  Service: Cardiovascular;  Laterality: N/A;   TEE WITHOUT CARDIOVERSION N/A 01/22/2021   Procedure: TRANSESOPHAGEAL ECHOCARDIOGRAM (TEE);  Surgeon: Chilton Si, MD;  Location: [facility] ENDOSCOPY;  Service: Cardiovascular;  Laterality: N/A;   TOTAL KNEE ARTHROPLASTY Left 02/12/2015   Procedure: TOTAL KNEE ARTHROPLASTY;  Surgeon: Loreta Ave, MD;  Location: [facility] OR;  Service: Orthopedics;  Laterality: Left;   UPPER GASTROINTESTINAL ENDOSCOPY      FAMHx:  Family History  Problem Relation Age of Onset   Heart  disease Mother    Alzheimer's disease Mother    Heart failure Father    Colon cancer Neg Hx    Stomach cancer Neg Hx    Esophageal cancer Neg Hx     SOCHx:   reports that she has never smoked. She has never used smokeless tobacco. She reports that she does not drink alcohol and does not use drugs.  ALLERGIES:  Allergies  Allergen Reactions   Doxycycline Other (See Comments)   Entresto [Sacubitril-Valsartan] Itching  Metformin Other (See Comments)    weakness   Omnicef [Cefdinir] Other (See Comments)    GI Upset   Aspirin Other (See Comments)    GI issues, Can tolerate low aspirin   Augmentin [Amoxicillin-Pot Clavulanate] Nausea And Vomiting and Other (See Comments)    GI issues DID THE REACTION INVOLVE: Swelling of the face/tongue/throat, SOB, or low BP? No Sudden or severe rash/hives, skin peeling, or the inside of the mouth or nose? No Did it require medical treatment? No When did it last happen?      long time  If all above answers are "NO", may proceed with cephalosporin use.    Erythromycin Nausea And Vomiting and Other (See Comments)    Gi issues   Gabapentin Swelling   Nickel Itching and Rash    Breakouts     ROS: Pertinent items noted in HPI and remainder of comprehensive ROS otherwise negative.  HOME MEDS: Current Outpatient Medications on File Prior to Visit  Medication Sig Dispense Refill   acetaminophen (TYLENOL) 650 MG CR tablet Take 650-1,300 mg by mouth every 8 (eight) hours as needed for pain (pain).      Alpha-D-Galactosidase (BEANO PO) Take 1-2 tablets by mouth daily as needed (gas). With certain foods.     ALPRAZolam (XANAX) 0.5 MG tablet Take 0.5 mg by mouth 2 (two) times daily as needed.     aspirin EC 81 MG tablet Take 81 mg by mouth every Monday, Wednesday, and Friday. Swallow whole.     Azelastine HCl 0.15 % SOLN Place 2 sprays into both nostrils daily.     Continuous Glucose Sensor (FREESTYLE LIBRE 3 PLUS SENSOR) MISC Apply 1 sensor q 15 days  as directed Dx-E11.22 for 90 days     diclofenac sodium (VOLTAREN) 1 % GEL Apply 1 application topically 4 (four) times daily as needed (back and knee).     ezetimibe-simvastatin (VYTORIN) 10-40 MG per tablet Take 1 tablet by mouth at bedtime.      ferric citrate (AURYXIA) 1 GM 210 MG(Fe) tablet Take 210-420 mg by mouth See admin instructions. Take 2 tablets (420 mg) by mouth 3 times daily with meals & take 1 tablet (210 mg) by mouth with each snack     fexofenadine (ALLEGRA) 180 MG tablet Take 180 mg by mouth daily.     guaiFENesin (MUCINEX) 600 MG 12 hr tablet Take 600 mg by mouth in the morning and at bedtime.      insulin glargine (LANTUS SOLOSTAR) 100 UNIT/ML Solostar Pen Inject 28 Units into the skin daily.     insulin lispro (HUMALOG) 100 UNIT/ML injection Inject 4-10 Units into the skin 3 (three) times daily before meals. Sliding Scale     lidocaine-prilocaine (EMLA) cream Apply topically.     midodrine (PROAMATINE) 10 MG tablet Take 10 mg by mouth every Tuesday, Thursday, and Saturday at 6 PM. Pre dialysis     multivitamin (RENA-VIT) TABS tablet Take 1 tablet by mouth daily with breakfast.      olopatadine (PATANOL) 0.1 % ophthalmic solution Place 1 drop into both eyes daily as needed (dry itchy eyes).     simethicone (MYLICON) 80 MG chewable tablet Chew 160 mg by mouth daily as needed for flatulence (indigestion.).      VITAMIN D, CHOLECALCIFEROL, PO Take 1 tablet by mouth Every Tuesday,Thursday,and Saturday with dialysis.     budesonide-formoterol (SYMBICORT) 160-4.5 MCG/ACT inhaler Inhale 2 puffs into the lungs 2 (two) times daily as needed (cough/respiratory issues.).  (Patient  not taking: Reported on 04/22/2023)     Difelikefalin Acetate (KORSUVA IV) Inject into the vein. (Patient not taking: Reported on 04/22/2023)     Fluticasone-Umeclidin-Vilant (TRELEGY ELLIPTA) 100-62.5-25 MCG/ACT AEPB Inhale 1 puff into the lungs daily as needed. (Patient not taking: Reported on 04/22/2023)      LANTUS SOLOSTAR 100 UNIT/ML Solostar Pen Inject 22 Units into the skin at bedtime. (Patient not taking: Reported on 04/22/2023)  3   Magnesium 200 MG TABS Take 0.5 tablets (100 mg total) by mouth 2 (two) times daily. (Patient not taking: Reported on 04/22/2023)     nitroGLYCERIN (NITROSTAT) 0.4 MG SL tablet Place 1 tablet (0.4 mg total) under the tongue every 5 (five) minutes x 3 doses as needed for chest pain. (Patient not taking: Reported on 04/22/2023) 6 tablet 1   No current facility-administered medications on file prior to visit.    LABS/IMAGING: No results found for this or any previous visit (from the past 48 hour(s)). No results found.  LIPID PANEL: No results found for: "CHOL", "TRIG", "HDL", "CHOLHDL", "VLDL", "LDLCALC", "LDLDIRECT"   WEIGHTS: Wt Readings from Last 3 Encounters:  04/22/23 155 lb 6.4 oz (70.5 kg)  05/10/22 158 lb 9.6 oz (71.9 kg)  05/13/21 140 lb (63.5 kg)    VITALS: BP (!) 140/74 (BP Location: Left Arm, Patient Position: Sitting, Cuff Size: Normal)   Pulse 90   Ht 5' (1.524 m)   Wt 155 lb 6.4 oz (70.5 kg)   SpO2 96%   BMI 30.35 kg/m   EXAM: General appearance: alert and no distress Neck: no carotid bruit, no JVD and thyroid not enlarged, symmetric, no tenderness/mass/nodules Lungs: clear to auscultation bilaterally Heart: regular rate and rhythm Abdomen: soft, non-tender; bowel sounds normal; no masses,  no organomegaly Extremities: edema 1+ edema Pulses: 2+ and symmetric Skin: Skin color, texture, turgor normal. No rashes or lesions Neurologic: Grossly normal Psych: Pleasant  EKG: EKG Interpretation Date/Time:  Friday April 22 2023 15:21:19 EDT Ventricular Rate:  90 PR Interval:  196 QRS Duration:  92 QT Interval:  422 QTC Calculation: 516 R Axis:   -47  Text Interpretation: Normal sinus rhythm Left axis deviation Low voltage QRS Inferior infarct , age undetermined Cannot rule out Anteroseptal infarct (cited on or before 02-Feb-2018)  Prolonged QT When compared with ECG of 10-Feb-2021 10:29, No significant change was found Confirmed by Zoila Shutter [phone]) on 04/22/2023 4:46:41 PM    ASSESSMENT: Chronic systolic congestive heart failure, NYHA class II symptoms-LVEF 25-30%, now up to 60-65% (12/2020) ESRD on HD (right thigh fistula) Insulin-dependent diabetes - last A1c 9.3% Dyslipidemia Anemia Palpitations/PVCs  PLAN: 1.   Belinda [name] as most recently had normal LV function by echo last year.  Blood pressure apparently is labile.  She is no longer on losartan but does take midodrine on dialysis days.  She says her blood pressure runs higher when they do not have to take any fluid off which is happened the last 3 dialysis sessions.  I still think she is hypertensive however it does seem that there are times where she is hypotensive by her report.  For now we will not add back any blood pressure medicines.  Follow-up with me in 6 months or sooner as necessary.  Chrystie Nose, MD, [facility], FACP  [facility]  [facility]  Medical Director of the [facility] Lipid [facility] &  Cardiovascular Risk Reduction Clinic Attending Cardiologist  Direct Dial: [phone]  Fax: [phone]  Website:  [email or web address]  Lisette Abu  Broughton Eppinger 04/22/2023, 4:46 PM

## 2023-05-13 ENCOUNTER — Ambulatory Visit: Payer: Medicare Other | Admitting: Adult Health

## 2023-05-30 ENCOUNTER — Encounter (HOSPITAL_COMMUNITY)
Admission: RE | Disposition: A | Discharge status: Home / Self Care | Payer: Self-pay | Source: Ambulatory Visit | Attending: Nephrology

## 2023-05-30 ENCOUNTER — Ambulatory Visit (HOSPITAL_COMMUNITY)
Admission: RE | Admit: 2023-05-30 | Discharge: 2023-05-30 | Disposition: A | Discharge status: Home / Self Care | Payer: Medicare Other | Source: Ambulatory Visit | Attending: Nephrology | Admitting: Nephrology

## 2023-05-30 ENCOUNTER — Other Ambulatory Visit: Payer: Self-pay

## 2023-05-30 DIAGNOSIS — Z8249 Family history of ischemic heart disease and other diseases of the circulatory system: Secondary | ICD-10-CM | POA: Insufficient documentation

## 2023-05-30 DIAGNOSIS — Y832 Surgical operation with anastomosis, bypass or graft as the cause of abnormal reaction of the patient, or of later complication, without mention of misadventure at the time of the procedure: Secondary | ICD-10-CM | POA: Insufficient documentation

## 2023-05-30 DIAGNOSIS — N250 Renal osteodystrophy: Secondary | ICD-10-CM | POA: Insufficient documentation

## 2023-05-30 DIAGNOSIS — Z794 Long term (current) use of insulin: Secondary | ICD-10-CM | POA: Insufficient documentation

## 2023-05-30 DIAGNOSIS — Z992 Dependence on renal dialysis: Secondary | ICD-10-CM | POA: Insufficient documentation

## 2023-05-30 DIAGNOSIS — E1122 Type 2 diabetes mellitus with diabetic chronic kidney disease: Secondary | ICD-10-CM | POA: Diagnosis not present

## 2023-05-30 DIAGNOSIS — Z96652 Presence of left artificial knee joint: Secondary | ICD-10-CM | POA: Diagnosis not present

## 2023-05-30 DIAGNOSIS — I132 Hypertensive heart and chronic kidney disease with heart failure and with stage 5 chronic kidney disease, or end stage renal disease: Secondary | ICD-10-CM | POA: Diagnosis not present

## 2023-05-30 DIAGNOSIS — T82858A Stenosis of vascular prosthetic devices, implants and grafts, initial encounter: Secondary | ICD-10-CM | POA: Insufficient documentation

## 2023-05-30 DIAGNOSIS — N186 End stage renal disease: Secondary | ICD-10-CM | POA: Diagnosis not present

## 2023-05-30 DIAGNOSIS — D649 Anemia, unspecified: Secondary | ICD-10-CM | POA: Insufficient documentation

## 2023-05-30 DIAGNOSIS — I509 Heart failure, unspecified: Secondary | ICD-10-CM | POA: Diagnosis not present

## 2023-05-30 HISTORY — PX: PERIPHERAL VASCULAR BALLOON ANGIOPLASTY: CATH118281

## 2023-05-30 HISTORY — PX: A/V FISTULAGRAM: CATH118298

## 2023-05-30 LAB — GLUCOSE, CAPILLARY
Glucose-Capillary: 230 mg/dL — ABNORMAL HIGH (ref 70–99)
Glucose-Capillary: 179 mg/dL — ABNORMAL HIGH (ref 70–99)

## 2023-05-30 SURGERY — A/V FISTULAGRAM
Anesthesia: LOCAL

## 2023-05-30 MED ORDER — LIDOCAINE HCL (PF) 1 % IJ SOLN
INTRAMUSCULAR | Status: DC | PRN
  Administered 2023-05-30: 20 mL

## 2023-05-30 MED ORDER — SODIUM CHLORIDE 0.9% FLUSH: 10.0000 mL | Freq: Two times a day (BID) | INTRAVENOUS | Status: DC

## 2023-05-30 MED ORDER — SODIUM CHLORIDE 0.9% FLUSH: 3.0000 mL | INTRAVENOUS | Status: DC | PRN

## 2023-05-30 MED ORDER — MIDAZOLAM HCL 2 MG/2ML IJ SOLN
INTRAMUSCULAR | Status: DC | PRN
  Administered 2023-05-30: 1 mg via INTRAVENOUS

## 2023-05-30 MED ORDER — HEPARIN (PORCINE) IN NACL 1000-0.9 UT/500ML-% IV SOLN
INTRAVENOUS | Status: DC | PRN
  Administered 2023-05-30: 500 mL

## 2023-05-30 MED ORDER — ONDANSETRON HCL 4 MG/2ML IJ SOLN: 4.0000 mg | Freq: Four times a day (QID) | INTRAMUSCULAR | Status: DC | PRN

## 2023-05-30 MED ORDER — FENTANYL CITRATE (PF) 100 MCG/2ML IJ SOLN
INTRAMUSCULAR | Status: AC
  Filled 2023-05-30: qty 2

## 2023-05-30 MED ORDER — FENTANYL CITRATE (PF) 100 MCG/2ML IJ SOLN
INTRAMUSCULAR | Status: DC | PRN
  Administered 2023-05-30: 25 ug via INTRAVENOUS

## 2023-05-30 MED ORDER — MIDAZOLAM HCL 2 MG/2ML IJ SOLN
INTRAMUSCULAR | Status: AC
Start: 2023-05-30 — End: ?
  Filled 2023-05-30: qty 2

## 2023-05-30 MED ORDER — IODIXANOL 320 MG/ML IV SOLN
INTRAVENOUS | Status: DC | PRN
  Administered 2023-05-30: 25 mL

## 2023-05-30 MED ORDER — ACETAMINOPHEN 325 MG PO TABS: 650.0000 mg | ORAL_TABLET | ORAL | Status: DC | PRN

## 2023-05-30 MED ORDER — LIDOCAINE HCL (PF) 1 % IJ SOLN
INTRAMUSCULAR | Status: AC
  Filled 2023-05-30: qty 30

## 2023-05-30 SURGICAL SUPPLY — 15 items
BAG SNAP BAND KOVER 36X36 (MISCELLANEOUS) ×2 IMPLANT
BALLN MUSTANG 9X40X75 (BALLOONS) ×2
BALLOON MUSTANG 9X40X75 (BALLOONS) IMPLANT
COVER DOME SNAP 22 D (MISCELLANEOUS) ×2 IMPLANT
KIT MICROPUNCTURE NIT STIFF (SHEATH) IMPLANT
PROTECTION STATION PRESSURIZED (MISCELLANEOUS) ×2
SHEATH PINNACLE 7F 10CM (SHEATH) IMPLANT
SHEATH PINNACLE R/O II 6F 4CM (SHEATH) IMPLANT
SHEATH PROBE COVER 6X72 (BAG) ×2 IMPLANT
STATION PROTECTION PRESSURIZED (MISCELLANEOUS) ×2 IMPLANT
STOPCOCK MORSE 400PSI 3WAY (MISCELLANEOUS) ×2 IMPLANT
SYR MEDALLION 10ML (SYRINGE) IMPLANT
TRAY PV CATH (CUSTOM PROCEDURE TRAY) ×2 IMPLANT
TUBING CIL FLEX 10 FLL-RA (TUBING) ×2 IMPLANT
WIRE STARTER BENTSON 035X150 (WIRE) IMPLANT

## 2023-05-30 NOTE — Op Note (Signed)
Patient presents with rising venous pressures noted on dialysis and decreased flows. On exam the right thigh loop AVG (arterial limb lateral) has decreased thrill and a high pitched bruit with hyperpulsatility. Last outflow angioplasty was with a 8mm PTA on 04/27/23.   Summary:  1)         Successful angiogram of a right femoral loop AVG  with evidence of venous anastomosis stenosis which was corrected with angioplasty (9 mm Mustang 100% effaced ~16-18 atm). 2)Rest of graft, centrals and inflow are widely patent. 3)         Patent arterial and venous limbs of the graft.    Description of procedure: The right thigh/leg was prepped and draped in the usual fashion. The right thigh loop AVG  was first cannulated ( 13244) in the arterial limb  in an antegrade direction and then a 7 Fr sheath was inserted by guidewire exchange technique.  Angiogram revealed a patent body of the graft with evidence of high venous pressures. There was 50-70% venous anastomosis stenosis with the central veins widely patent.   On reflux arteriogram, the arterial anastomosis and inflow graft segment were widely patent.  I then advanced a wire through the sheath and parked it in the central veins. I then advanced a 9 x 4 Mustang angioplasty balloon through the antegrade sheath over the guidewire to the level of the venous anastomosis  stenosis.  Venous  angioplasty was performed to 100% balloon effacement at approximately 16-18 atm of pressure via a hand syringe assembly.    Final arteriogram and completion venogram revealed no evidence of extravasation or dissection, more rapid flows into the graft, 10% residual stenosis in the outflow venous anastomosis.   Hemostasis: A 3-0 ethilon purse string suture was placed at the cannulation site on removal of the sheath.   Sedation: Versed 1mg  and Fentanyl were given with monitoring by the nurse; see intra-op nursing notes.   Contrast: Visipaque 25ml   Monitoring: Because of  the patient's comorbid conditions and sedation during the procedure, continuous EKG monitoring and O2 saturation monitoring was performed throughout the procedure by the RN. There were no abnormal arrhythmias encountered.   Complications: None.    Diagnoses: I87.1 Stricture of vein  N18.6 ESRD T82.858A Stricture of access   Procedure Coding:  [phone] Cannulation and angiogram of fistula, venous angioplasty (AVG VA) [id number] Contrast   Recommendations:  1. Continue to cannulate the graft  with 15G needles; please remove and hold pressure on venous site 1st and then arterial site.  2. Refer back for problems with flows. 3. Remove the suture next treatment.    Discharge: The patient was discharged home in stable condition. The patient was given education regarding the care of the dialysis access AVF and specific instructions in case of any problems.

## 2023-05-30 NOTE — H&P (Addendum)
Chief Complaint: Decreased flows  Interval H&P  The patient has presented today for an angiogram/ angioplasty; patient is followed at [facility] with Dr. Marisue Humble.  Various methods of treatment have been discussed with the patient.  After consideration of risk, benefits and other options for treatment, the patient has consented to a angiogram/ angioplasty with  possible stent placement.   Risks of angiogram with potential angioplasty and stenting if needed.contrast reaction, extravasation/ bleeding, dissection, hypotension and death were explained to the patient.  The patient's history has been reviewed and the patient has been examined, no changes in status.  Stable for angiogram/angioplasty  I have reviewed the patient's chart and labs.  Questions were answered to the patient's satisfaction.  Assessment/Plan: ESRD dialyzing at [facility] TTS regimen with last dialysis Saturday (full treatment). Not considered to be a candidate for renal transplant.  Decreased access flows - planning on angiogram with possibly angioplasty. She just had the outflow treated on 04/27/2023 but has had prolonged bleeding. Renal osteodystrophy - continue binders per home regimen. Anemia - managed with ESA's and IV iron at dialysis center. HTN - Issues with hypertension alternating with hypotension requiring midodrine on dialysis days.Belinda [name]   HPI: Belinda [name] is an 72 y.o. female HFpEF (25-30% recovered to 55-60%), HTN, IDDM, HLD, migraines, pulmonary hypertension, left TKR 2016, ? Asthma, ESRD started on dialysis in early 2022.  She just had the outflow treated on 04/27/2023 but has had prolonged bleeding.  ROS Per HPI.  Chemistry and CBC: Creatinine, Ser  Date/Time Value Ref Range Status  02/12/2021 12:46 AM 6.19 (H) 0.44 - 1.00 mg/dL Final    Comment:    DELTA CHECK NOTED  02/11/2021 03:12 AM 3.48 (H) 0.44 - 1.00 mg/dL Final    Comment:    DELTA CHECK NOTED  02/10/2021 11:19 AM 7.34 (H) 0.44 - 1.00 mg/dL Final   16/03/9603 54:09 AM 8.36 (H) 0.44 - 1.00 mg/dL Final  81/19/1478 29:56 AM 10.62 (H) 0.44 - 1.00 mg/dL Final  21/30/8657 84:69 AM 8.83 (H) 0.44 - 1.00 mg/dL Final  62/95/2841 32:44 AM 7.41 (H) 0.44 - 1.00 mg/dL Final  06/30/7251 66:44 PM 6.43 (H) 0.44 - 1.00 mg/dL Final  03/47/4259 56:38 AM 7.20 (H) 0.44 - 1.00 mg/dL Final  75/64/3329 51:88 AM 6.70 (H) 0.44 - 1.00 mg/dL Final  41/66/0630 16:01 AM 5.90 (H) 0.44 - 1.00 mg/dL Final  09/32/3557 32:20 AM 4.70 (H) 0.44 - 1.00 mg/dL Final  25/42/7062 37:62 PM 11.34 (H) 0.44 - 1.00 mg/dL Final  83/15/1761 60:73 PM 6.39 (H) 0.44 - 1.00 mg/dL Final  71/11/2692 85:46 PM 4.53 (H) 0.44 - 1.00 mg/dL Final  27/08/5007 38:18 PM 3.79 (H) 0.44 - 1.00 mg/dL Final  29/93/7169 67:89 AM 3.77 (H) 0.44 - 1.00 mg/dL Final  38/03/1750 02:58 PM 4.32 (H) 0.44 - 1.00 mg/dL Final  52/77/8242 35:36 PM 3.22 (H) 0.44 - 1.00 mg/dL Final  14/43/1540 08:67 PM 3.47 (H) 0.44 - 1.00 mg/dL Final  61/95/0932 67:12 AM 4.68 (H) 0.44 - 1.00 mg/dL Final  45/80/9983 38:25 PM 4.08 (H) 0.44 - 1.00 mg/dL Final  05/39/7673 41:93 PM 2.74 (H) 0.44 - 1.00 mg/dL Final  79/07/4095 35:32 PM 3.02 (H) 0.44 - 1.00 mg/dL Final  99/24/2683 41:96 AM 2.85 (H) 0.44 - 1.00 mg/dL Final  22/29/7989 21:19 AM 3.24 (H) 0.44 - 1.00 mg/dL Final  41/74/0814 48:18 AM 3.27 (H) 0.44 - 1.00 mg/dL Final  56/31/4970 26:37 AM 2.76 (H) 0.44 - 1.00 mg/dL Final  85/88/5027 74:12 AM 2.73 (H)  0.44 - 1.00 mg/dL Final  78/29/5621 30:86 PM 2.82 (H) 0.44 - 1.00 mg/dL Final  57/84/6962 95:28 AM 3.11 (H) 0.44 - 1.00 mg/dL Final  41/32/4401 02:72 AM 3.08 (H) 0.44 - 1.00 mg/dL Final  53/66/4403 47:42 AM 3.10 (H) 0.44 - 1.00 mg/dL Final  59/56/3875 64:33 AM 3.14 (H) 0.44 - 1.00 mg/dL Final  29/51/8841 66:06 AM 2.95 (H) 0.44 - 1.00 mg/dL Final  30/16/0109 32:35 AM 2.91 (H) 0.44 - 1.00 mg/dL Final  57/32/2025 42:70 AM 2.37 (H) 0.44 - 1.00 mg/dL Final  62/37/6283 15:17 AM 2.47 (H) 0.44 - 1.00 mg/dL Final  61/60/7371 06:26 PM  2.58 (H) 0.44 - 1.00 mg/dL Final  94/85/4627 03:50 PM 2.60 (H) 0.44 - 1.00 mg/dL Final  09/38/1829 93:71 PM 2.32 (H) 0.57 - 1.00 mg/dL Final  69/67/8938 10:17 PM 1.99 (H) 0.57 - 1.00 mg/dL Final  51/07/5850 77:82 PM 2.12 (H) 0.57 - 1.00 mg/dL Final  42/35/3614 43:15 PM 2.44 (H) 0.57 - 1.00 mg/dL Final  40/01/6760 95:09 PM 2.31 (H) 0.57 - 1.00 mg/dL Final  32/67/1245 80:99 PM 1.97 (H) 0.40 - 1.20 mg/dL Final  83/38/2505 39:76 AM 1.13 (H) 0.44 - 1.00 mg/dL Final  73/41/9379 02:40 AM 1.07 (H) 0.44 - 1.00 mg/dL Final  97/35/3299 24:26 AM 1.21 (H) 0.44 - 1.00 mg/dL Final  83/41/9622 29:79 PM 1.05 (H) 0.44 - 1.00 mg/dL Final  89/21/1941 74:08 AM 1.06 0.50 - 1.10 mg/dL Final  14/48/1856 31:49 PM 0.98 0.50 - 1.10 mg/dL Final   No results for input(s): "NA", "K", "CL", "CO2", "GLUCOSE", "BUN", "CREATININE", "CALCIUM", "PHOS" in the last 168 hours.  Invalid input(s): "ALB" No results for input(s): "WBC", "NEUTROABS", "HGB", "HCT", "MCV", "PLT" in the last 168 hours. Liver Function Tests: No results for input(s): "AST", "ALT", "ALKPHOS", "BILITOT", "PROT", "ALBUMIN" in the last 168 hours. No results for input(s): "LIPASE", "AMYLASE" in the last 168 hours. No results for input(s): "AMMONIA" in the last 168 hours. Cardiac Enzymes: No results for input(s): "CKTOTAL", "CKMB", "CKMBINDEX", "TROPONINI" in the last 168 hours. Iron Studies: No results for input(s): "IRON", "TIBC", "TRANSFERRIN", "FERRITIN" in the last 72 hours. PT/INR: @LABRCNTIP (inr:5)  Xrays/Other Studies: )No results found for this or any previous visit (from the past 48 hour(s)). No results found.  PMH:   Past Medical History:  Diagnosis Date   Acute CHF (congestive heart failure) (HCC) 02/02/2018   Anemia    "get monthly injections to boost my HgB" (02/02/2018)   Anxiety    Asthma    Back pain    intractable low back   Chronic neck pain    CKD (chronic kidney disease), stage III (HCC)    on dialysis T/Th/Sa Rudene Anda    Depression    Dysrhythmia    palpitations or heart racing periodic. has had for years- Dr Patty Sermons follows.   GERD (gastroesophageal reflux disease)    History of blood transfusion 2016   After knee replacemnet   Hx MRSA infection    Hyperlipidemia    Hypertension    Migraines    "~ 2/month" (02/02/2018), gets headaches with dialysis   Nerve damage    right hand   Osteoarthritis    "knees, neck" (02/02/2018)   Palpitations    Pneumonia    "twice in 2018" (02/02/2018)   Pulmonary artery hypertension (HCC)    Seasonal allergies    Type II diabetes mellitus (HCC)    insulin dependent - fasting 140-200     PSH:   Past Surgical History:  Procedure Laterality Date   ABDOMINAL HYSTERECTOMY  03/2001   Abdominal supracervical hysterectomy, left salpingo-oophorectomy   ANTERIOR CERVICAL DECOMP/DISCECTOMY FUSION N/A 01/01/2014   Procedure: CERVICAL THREE-FOUR,CERVICAL FIVE-SIX,CERVICAL SIX-SEVEN ANTERIOR CERVICAL DECOMPRESSION/DISCECTOMY/FUSION;  Surgeon: Barnett Abu, MD;  Location: [facility] NEURO ORS;  Service: Neurosurgery;  Laterality: N/A;  left-side approach   AV FISTULA PLACEMENT Right 02/13/2020   Procedure: ARTERIOVENOUS (AV) FISTULA CREATION;  Surgeon: Maeola Harman, MD;  Location: [facility] OR;  Service: Vascular;  Laterality: Right;   AV FISTULA PLACEMENT Right 08/11/2020   Procedure: INSERTION OF RIGHT THIGH ARTERIOVENOUS (AV) GORE-TEX GRAFT;  Surgeon: Chuck Hint, MD;  Location: [facility] OR;  Service: Vascular;  Laterality: Right;   BACK SURGERY     BASCILIC VEIN TRANSPOSITION Right 04/16/2020   Procedure: BASCILIC VEIN TRANSPOSITION SECOND STAGE RIGHT;  Surgeon: Maeola Harman, MD;  Location: [facility] OR;  Service: Vascular;  Laterality: Right;   CATARACT EXTRACTION W/ INTRAOCULAR LENS  IMPLANT, BILATERAL Bilateral 2012   CESAREAN SECTION     COLONOSCOPY     COLONOSCOPY W/ POLYPECTOMY     DILATION AND CURETTAGE OF UTERUS  X 2   ESOPHAGOGASTRODUODENOSCOPY      HAND SURGERY Left 1970s   laceration repair 4th and 5th digits   KNEE ARTHROSCOPY Left    torn mensicus   LIGATION OF COMPETING BRANCHES OF ARTERIOVENOUS FISTULA Right 05/07/2020   Procedure: LIGATION OF RIGHT UPPER EXTREMITY FISTULA;  Surgeon: Maeola Harman, MD;  Location: [facility] OR;  Service: Vascular;  Laterality: Right;   NASAL SINUS SURGERY     POLYPECTOMY     RIGHT HEART CATH N/A 06/23/2018   Procedure: RIGHT HEART CATH;  Surgeon: Dolores Patty, MD;  Location: [facility] INVASIVE CV LAB;  Service: Cardiovascular;  Laterality: N/A;   TEE WITHOUT CARDIOVERSION N/A 01/22/2021   Procedure: TRANSESOPHAGEAL ECHOCARDIOGRAM (TEE);  Surgeon: Chilton Si, MD;  Location: [facility] ENDOSCOPY;  Service: Cardiovascular;  Laterality: N/A;   TOTAL KNEE ARTHROPLASTY Left 02/12/2015   Procedure: TOTAL KNEE ARTHROPLASTY;  Surgeon: Loreta Ave, MD;  Location: [facility] OR;  Service: Orthopedics;  Laterality: Left;   UPPER GASTROINTESTINAL ENDOSCOPY      Allergies:  Allergies  Allergen Reactions   Doxycycline Other (See Comments)   Entresto [Sacubitril-Valsartan] Itching   Metformin Other (See Comments)    weakness   Omnicef [Cefdinir] Other (See Comments)    GI Upset   Aspirin Other (See Comments)    GI issues, Can tolerate low aspirin   Augmentin [Amoxicillin-Pot Clavulanate] Nausea And Vomiting and Other (See Comments)    GI issues DID THE REACTION INVOLVE: Swelling of the face/tongue/throat, SOB, or low BP? No Sudden or severe rash/hives, skin peeling, or the inside of the mouth or nose? No Did it require medical treatment? No When did it last happen?      long time  If all above answers are "NO", may proceed with cephalosporin use.    Erythromycin Nausea And Vomiting and Other (See Comments)    Gi issues   Gabapentin Swelling   Nickel Itching and Rash    Breakouts     Medications:   Prior to Admission medications   Medication Sig Start Date End Date Taking? Authorizing Provider   acetaminophen (TYLENOL) 650 MG CR tablet Take 1,300 mg by mouth every 8 (eight) hours as needed for pain (pain).   Yes [provider]  ALPRAZolam (XANAX) 0.5 MG tablet Take 0.25-0.5 mg by mouth 2 (two) times daily as needed for anxiety. 12/04/22  Yes [provider]  azelastine (ASTELIN) 0.1 % nasal spray Place 2 sprays into both nostrils daily as needed for allergies.   Yes [provider]  diclofenac sodium (VOLTAREN) 1 % GEL Apply 1 application topically 4 (four) times daily as needed (back and knee).   Yes [provider]  ezetimibe-simvastatin (VYTORIN) 10-40 MG per tablet Take 1 tablet by mouth at bedtime.    Yes [provider]  ferric citrate (AURYXIA) 1 GM 210 MG(Fe) tablet Take 210-420 mg by mouth See admin instructions. Take 2 tablets (420 mg) by mouth 3 times daily with meals & take 1 tablet (210 mg) by mouth with each snack 11/01/19  Yes [provider]  fexofenadine (ALLEGRA) 180 MG tablet Take 180 mg by mouth daily.   Yes [provider]  Fluticasone-Umeclidin-Vilant (TRELEGY ELLIPTA) 100-62.5-25 MCG/ACT AEPB Inhale 1 puff into the lungs daily as needed (shortness of breath).   Yes [provider]  furosemide (LASIX) 20 MG tablet Take 20 mg by mouth daily as needed for fluid or edema. 05/10/23  Yes [provider]  guaiFENesin (MUCINEX) 600 MG 12 hr tablet Take 600 mg by mouth in the morning and at bedtime.    Yes [provider]  insulin glargine (LANTUS SOLOSTAR) 100 UNIT/ML Solostar Pen Inject 24 Units into the skin at bedtime. 12/31/20  Yes [provider]  insulin lispro (HUMALOG) 100 UNIT/ML injection Inject 4-10 Units into the skin 3 (three) times daily before meals. Sliding Scale   Yes [provider]  lidocaine-prilocaine (EMLA) cream Apply 1 Application topically every Tuesday, Thursday, and Saturday at 6 PM. 04/26/22  Yes [provider]  Magnesium 200 MG TABS Take  200 mg by mouth every other day.   Yes [provider]  midodrine (PROAMATINE) 10 MG tablet Take 10 mg by mouth See admin instructions. Take 20 mg by mouth on Tuesday, Thursday and Saturday prior to dialysis, may take additional tablets as needed during dialysis.   Yes [provider]  multivitamin (RENA-VIT) TABS tablet Take 1 tablet by mouth daily with breakfast.  11/01/19  Yes [provider]  nitroGLYCERIN (NITROSTAT) 0.4 MG SL tablet Place 1 tablet (0.4 mg total) under the tongue every 5 (five) minutes x 3 doses as needed for chest pain. 03/14/20  Yes Bensimhon, Bevelyn Buckles, MD  olopatadine (PATANOL) 0.1 % ophthalmic solution Place 1 drop into both eyes daily.   Yes [provider]  simethicone (MYLICON) 125 MG chewable tablet Chew 125 mg by mouth daily as needed for flatulence (indigestion.).   Yes [provider]  VITAMIN D, CHOLECALCIFEROL, PO Take 1 tablet by mouth Every Tuesday,Thursday,and Saturday with dialysis.   Yes [provider]  aspirin EC 81 MG tablet Take 81 mg by mouth every Monday, Wednesday, and Friday. Swallow whole.    [provider]  Continuous Glucose Sensor (FREESTYLE LIBRE 3 PLUS SENSOR) MISC Apply 1 sensor q 15 days as directed [id number] for 90 days 03/21/23   [provider]  Difelikefalin Acetate (KORSUVA IV) Inject into the vein. Patient not taking: Reported on 04/22/2023    [provider]    Discontinued Meds:   Medications Discontinued During This Encounter  Medication Reason   LANTUS SOLOSTAR 100 UNIT/ML Solostar Pen Patient Preference   budesonide-formoterol (SYMBICORT) 160-4.5 MCG/ACT inhaler Patient Preference   Alpha-D-Galactosidase (BEANO PO) Patient Preference    Social History:  reports that she has never smoked. She has never used smokeless tobacco. She  reports that she does not drink alcohol and does not use drugs.  Family History:   Family History  Problem Relation Age of  Onset   Heart disease Mother    Alzheimer's disease Mother    Heart failure Father    Colon cancer Neg Hx    Stomach cancer Neg Hx    Esophageal cancer Neg Hx     There were no vitals taken for this visit. General appearance: NAD Neck: no carotid bruit, no JVD  Lungs: CTA b/l Heart: RRR Abdomen: soft, non-tender; bowel sounds normal Extremities: edema none Pulses: 2+ and symmetric Skin: Skin color, texture, turgor normal. No rashes or lesions Neurologic: Grossly normal Psych: Pleasant Access: right femoral AVG pulsatile      Samie Reasons, Len Blalock, MD 05/30/2023, 9:52 AM

## 2023-05-30 NOTE — Discharge Instructions (Signed)

## 2023-05-31 ENCOUNTER — Encounter (HOSPITAL_COMMUNITY): Payer: Self-pay | Admitting: Nephrology

## 2023-05-31 NOTE — Op Note (Signed)
Patient presents with rising venous pressures noted on dialysis and decreased flows. On exam the right thigh loop AVG (arterial limb lateral) has decreased thrill and a high pitched bruit with hyperpulsatility. Last outflow angioplasty was with a 8mm PTA on 04/27/23.   Summary:  1)         Successful angiogram of a right femoral loop AVG  with evidence of venous anastomosis stenosis which was corrected with angioplasty (9 mm Mustang 100% effaced ~16-18 atm). 2)Rest of graft, centrals and inflow are widely patent. 3)         Patent arterial and venous limbs of the graft.    Description of procedure: The right thigh/leg was prepped and draped in the usual fashion. The right thigh loop AVG  was first cannulated ( 13244) in the arterial limb  in an antegrade direction and then a 7 Fr sheath was inserted by guidewire exchange technique.  Angiogram revealed a patent body of the graft with evidence of high venous pressures. There was 50-70% venous anastomosis stenosis with the central veins widely patent.   On reflux arteriogram, the arterial anastomosis and inflow graft segment were widely patent.  I then advanced a wire through the sheath and parked it in the central veins. I then advanced a 9 x 4 Mustang angioplasty balloon through the antegrade sheath over the guidewire to the level of the venous anastomosis  stenosis.  Venous  angioplasty was performed to 100% balloon effacement at approximately 16-18 atm of pressure via a hand syringe assembly.    Final arteriogram and completion venogram revealed no evidence of extravasation or dissection, more rapid flows into the graft, 10% residual stenosis in the outflow venous anastomosis.   Hemostasis: A 3-0 ethilon purse string suture was placed at the cannulation site on removal of the sheath.   Sedation: Versed 1mg  and Fentanyl were given with monitoring by the nurse; see intra-op nursing notes.   Contrast: Visipaque 25ml   Monitoring: Because of  the patient's comorbid conditions and sedation during the procedure, continuous EKG monitoring and O2 saturation monitoring was performed throughout the procedure by the RN. There were no abnormal arrhythmias encountered.   Complications: None.    Diagnoses: I87.1 Stricture of vein  N18.6 ESRD T82.858A Stricture of access   Procedure Coding:  [phone] Cannulation and angiogram of fistula, venous angioplasty (AVG VA) [id number] Contrast   Recommendations:  1. Continue to cannulate the graft  with 15G needles; please remove and hold pressure on venous site 1st and then arterial site.  2. Refer back for problems with flows. 3. Remove the suture next treatment.    Discharge: The patient was discharged home in stable condition. The patient was given education regarding the care of the dialysis access AVF and specific instructions in case of any problems.

## 2023-06-15 ENCOUNTER — Other Ambulatory Visit: Payer: Self-pay

## 2023-06-15 ENCOUNTER — Ambulatory Visit: Payer: Medicare Other | Admitting: Family Medicine

## 2023-06-15 ENCOUNTER — Ambulatory Visit (INDEPENDENT_AMBULATORY_CARE_PROVIDER_SITE_OTHER): Payer: Medicare Other

## 2023-06-15 VITALS — BP 168/88 | HR 102 | Ht 60.0 in | Wt 158.0 lb

## 2023-06-15 DIAGNOSIS — M25561 Pain in right knee: Secondary | ICD-10-CM | POA: Diagnosis not present

## 2023-06-15 DIAGNOSIS — G8929 Other chronic pain: Secondary | ICD-10-CM | POA: Diagnosis not present

## 2023-06-15 DIAGNOSIS — M25562 Pain in left knee: Secondary | ICD-10-CM | POA: Diagnosis not present

## 2023-06-15 NOTE — Progress Notes (Unsigned)
Rubin Payor, PhD, LAT, ATC acting as a scribe for Clementeen Graham, MD.  Belinda [name] is a 72 y.o. female who presents to [facility] today for bilat knee pain x ongoing for about a year, progressively worsening. She does not she suffering a fall. Hx of TKR in her L knee. Pt locates pain to all over the anterior aspect.   She does have a history of left total knee replacement in 2016.  The left knee is mildly painful as well.  R Knee swelling: yes Mechanical symptoms: yes Aggravates: walking, activity Treatments tried: creams,   Pertinent review of systems: No fevers or chills  Relevant historical information: Left total knee replacement.  Dialysis.   Exam:  BP (!) 168/88   Pulse (!) 102   Ht 5' (1.524 m)   Wt 158 lb (71.7 kg)   SpO2 98%   BMI 30.86 kg/m  General: Well Developed, well nourished, and in no acute distress.   MSK: Right knee mild effusion normal motion with crepitation.  Left knee mature scar anterior knee mild swelling otherwise normal-appearing  Right knee nontender to palpation.  Stable ligamentous exam.  Intact strength.    Lab and Radiology Results  Procedure: Real-time Ultrasound Guided Injection of right knee joint superior lateral patella space Device: Philips Affiniti 50G/GE Logiq Images permanently stored and available for review in PACS Verbal informed consent obtained.  Discussed risks and benefits of procedure. Warned about infection, bleeding, hyperglycemia damage to structures among others. Patient expresses understanding and agreement Time-out conducted.   Noted no overlying erythema, induration, or other signs of local infection.   Skin prepped in a sterile fashion.   Local anesthesia: Topical Ethyl chloride.   With sterile technique and under real time ultrasound guidance: 40 mg of Kenalog and 2 mL of Marcaine injected into knee joint. Fluid seen entering the joint capsule.   Completed without  difficulty   Pain immediately resolved suggesting accurate placement of the medication.   Advised to call if fevers/chills, erythema, induration, drainage, or persistent bleeding.   Images permanently stored and available for review in the ultrasound unit.  Impression: Technically successful ultrasound guided injection.   X-ray images bilateral knees obtained today personally and independently interpreted  Right knee: Severe medial and moderate patellofemoral DJD.  No acute fractures.  Left knee: Well appearing total knee replacement.  No hardware loosening or acute fractures.  Await formal radiology review     Assessment and Plan: 72 y.o. female with bilateral knee pain right worse than left.   Right knee pain due to DJD.  Plan for steroid injection today.  Continue to topical creams and check back as needed.  Left knee checking x-ray to ensure integrity of total knee replacement.    PDMP not reviewed this encounter. Orders Placed This Encounter  Procedures   [place] LIMITED JOINT SPACE STRUCTURES LOW BILAT(NO LINKED CHARGES)    Reason for Exam (SYMPTOM  OR DIAGNOSIS REQUIRED):   eval knee pain    Preferred imaging location?:   [facility] Knee AP/LAT W/Sunrise Right    Standing Status:   Future    Number of Occurrences:   1    Expiration Date:   07/16/2023    Reason for Exam (SYMPTOM  OR DIAGNOSIS REQUIRED):   right knee pain    Preferred imaging location?:   [facility]   DG Knee AP/LAT W/Sunrise Left    Standing Status:  Future    Number of Occurrences:   1    Expiration Date:   07/16/2023    Reason for Exam (SYMPTOM  OR DIAGNOSIS REQUIRED):   bilateral knee pain    Preferred imaging location?:   [facility]   No orders of the defined types were placed in this encounter.    Discussed warning signs or symptoms. Please see discharge instructions. Patient expresses understanding.   The above documentation has been reviewed  and is accurate and complete Clementeen Graham, M.D.

## 2023-06-15 NOTE — Patient Instructions (Addendum)
Thank you for coming in today.   You received an injection today. Seek immediate medical attention if the joint becomes red, extremely painful, or is oozing fluid.   Please get an Xray today before you leave   Please use Voltaren gel (Generic Diclofenac Gel) up to 4x daily for pain as needed.  This is available over-the-counter as both the name brand Voltaren gel and the generic diclofenac gel.   Check back as needed

## 2023-12-02 ENCOUNTER — Telehealth: Payer: Self-pay | Admitting: Internal Medicine

## 2023-12-02 NOTE — Telephone Encounter (Signed)
 Pt called to report that she has had a cough and some SOB with rest and exertion... she has had a productive cough that is clear, white abn thick... dialysis this week gave her a rescue MDI... she went to the Urgent Care twice this week and they gave her 2 shots of antibiotic and steroid.   She has some swelling of her ankles that is not worse than her usual according to her...  She denies chest pain, no fever.   I made her an appt with Dr Maximo Spar.Aaron Aas she will let us  know if she develops any worsening symptoms... or if she gets much better then we can possibly postpone her appt some and she agrees.

## 2023-12-02 NOTE — Telephone Encounter (Signed)
 Pt c/o Shortness Of Breath: STAT if SOB developed within the last 24 hours or pt is noticeably SOB on the phone  1. Are you currently SOB (can you hear that pt is SOB on the phone)? no  2. How long have you been experiencing SOB? 2 weeks  3. Are you SOB when sitting or when up moving around? Moving around  4. Are you currently experiencing any other symptoms?  coughing

## 2023-12-26 ENCOUNTER — Ambulatory Visit (HOSPITAL_BASED_OUTPATIENT_CLINIC_OR_DEPARTMENT_OTHER): Admitting: Internal Medicine

## 2023-12-26 VITALS — BP 140/70 | HR 94 | Ht 60.0 in | Wt 146.2 lb

## 2023-12-26 DIAGNOSIS — I493 Ventricular premature depolarization: Secondary | ICD-10-CM | POA: Diagnosis not present

## 2023-12-26 DIAGNOSIS — R5383 Other fatigue: Secondary | ICD-10-CM

## 2023-12-26 DIAGNOSIS — I5022 Chronic systolic (congestive) heart failure: Secondary | ICD-10-CM

## 2023-12-26 DIAGNOSIS — R0902 Hypoxemia: Secondary | ICD-10-CM | POA: Diagnosis not present

## 2023-12-26 DIAGNOSIS — R0602 Shortness of breath: Secondary | ICD-10-CM

## 2023-12-26 DIAGNOSIS — N186 End stage renal disease: Secondary | ICD-10-CM

## 2023-12-26 NOTE — Patient Instructions (Signed)
 Medication Instructions:  Your physician recommends that you continue on your current medications as directed. Please refer to the Current Medication list given to you today.  *If you need a refill on your cardiac medications before your next appointment, please call your pharmacy*  Testing/Procedures: Your physician has requested that you have an echocardiogram. Echocardiography is a painless test that uses sound waves to create images of your heart. It provides your doctor with information about the size and shape of your heart and how well your heart's chambers and valves are working. This procedure takes approximately one hour. There are no restrictions for this procedure. Please do NOT wear cologne, perfume, aftershave, or lotions (deodorant is allowed). Please arrive 15 minutes prior to your appointment time.   Follow-Up: At [facility], you and your health needs are our priority.  As part of our continuing mission to provide you with exceptional heart care, our providers are all part of one team.  This team includes your primary Cardiologist (physician) and Advanced Practice Providers or APPs (Physician Assistants and Nurse Practitioners) who all work together to provide you with the care you need, when you need it.  Your next appointment:   TBD -with Dr. Mona pending echo results

## 2023-12-26 NOTE — Progress Notes (Signed)
 OFFICE NOTE  Chief Complaint:  Shortness of breath  Primary Care Physician: Nichole Senior, MD  HPI:  Belinda [name] is a 73 y.o. female with a past medial history significant for palpitations and prior PVCs.  She was formerly followed by Dr. Dominick and Dr. Alveta.  Recently in September she presented to [facility] urgent care with shortness of breath and was thought to have a pneumonia.  She was treated with antibiotics however her chest x-ray indicated mild to moderate cardiomegaly and mild congestive heart failure pattern with a superimposed bibasilar infiltrate.  She was instructed to follow-up with cardiology as there was concern for possible new congestive heart failure.  She has a long-standing history of hypertension as well as insulin -dependent diabetes followed by Dr. Nichole.  In 2016 she underwent left knee replacement and had a nuclear stress test prior to that which showed normal perfusion.  She denies any new chest pain.  She does report getting short of breath with exertion.  She also recently has had some wheezing.  Her primary care provider put her on some Symbicort which seems to have helped somewhat with her symptoms as it was felt to be allergic.  She also reports some lower extremity edema.  Additionally, she has stage III chronic kidney disease and is followed by Dr. Gearline with [facility].  05/27/2017  Belinda [name] returns today for follow-up.  She reports her breathing has improved.  We made some adjustments to her medications, including discontinuing hydrochlorothiazide .  She is now on torsemide  20 mg daily.  Echo shows her LVEF at 25-30% as expected. Initially her creatinine was rising somewhat, however, she has diuresed.  Blood pressure remains elevated.  We discussed options for additional heart failure management, including switching her losartan  over to Entresto .  She may need additional medication for blood pressure as well.  08/30/2017  Belinda [name]  was seen today in follow-up.  She recently saw Dr. Gearline at [facility].  She was noted to have significant weight gain and acute systolic congestive heart failure.  Her weight had gone up to 279 pounds.  She been struggling with influenza and pneumonia and received steroids and antibiotics.  This likely played a role in her weight gain.  She was advised to increase her torsemide  to 20 mg twice daily.  Since then she has had significant diuresis.  Her weight today is down to 167 pounds.  She reports improvement in her breathing.  She is tolerated Entresto  with a moderate dose and we discussed possibly increasing that today.  She reports very infrequent episodes of hypotension with systolic blood pressure around 100 but in general her systolic blood pressures are around 135 to 145.  11/25/2017  Belinda [name] returns today for follow-up of heart failure.  She reports that she is not felt as well on the high-dose Entresto .  In fact she started taking the medication once daily.  I reminded her that there is no evidence of benefit on once daily dosing, that I am aware of.  Typically if she is having side effects on the high dose of medication we would recommend decreasing the dose.  She also noted her blood pressure had been elevated although it is only top normal today at 138/90.  She denies any worsening shortness of breath.  Weight is remained stable around 166 pounds.  She does have occasional lower extremity swelling which improves with elevation of her feet.  She is interested in compression stockings.  09/15/2020  Belinda [name] is seen today for follow-up of heart failure.  I last saw her in 2019.  Shortly after that she was seen by Dr. Cherrie in the heart failure clinic and has been followed regularly there.  Unfortunately she is started on dialysis in the interim.  She has been taken off of nearly all of her heart failure medicines except for low-dose losartan .  She struggled with labile hypertension.   Blood pressure was very high today however at dialysis at times her blood pressure is very low even down to the 70s systolic.  She says that it limits her ultrafiltration as she feels like she is carrying more fluid.  Indeed her weight has been up about 25 pounds since she saw Dr. Cherrie last September.  She does have a dialysis fistula in her right thigh which was placed recently and a temporary catheter which is due to be removed.  She is scheduled to have an echo in April and see Dr. Bensimhon thereafter.  05/13/2021  Belinda [name] returns today for follow-up.  She is accompanied by her husband.  She recently graduated from the heart failure clinic.  Her LVEF had recovered up to 55 to 60%.  She had been hospitalized for bacteremia in the summer and underwent echo and TEE which showed no evidence of endocarditis.  Secondarily she developed a volume overload due to not adjusting her dry weight.  Then recently she had to prep for colonoscopy and was again a little volume overloaded.  I suspect all probably related to diastolic dysfunction.  Pressure was high today however she usually takes her meds in the evening and does not need to use midodrine  at times prior to her dialysis which is Tuesday, Thursday and Saturday.  05/10/2022  Belinda [name] is seen today in follow-up.  Overall she seems to be doing well.  Her blood pressure is quite elevated today 182/95.  She tells me that her losartan  was discontinued because she has issues with low blood pressure, which is most notable on dialysis days.  She may have to take midodrine  anywhere between 10 to 20 mg on dialysis days.  Blood pressure appears to be somewhat labile and is difficult to determine whether she should be on additional blood pressure lowering therapy.  Generally in the office, however blood pressure is quite high.  EKG shows a normal sinus rhythm today.  She denies any chest pain or shortness of breath.  04/22/2023  Belinda [name] returns today  for follow-up.  Overall she says she is feeling well.  She continues on hemodialysis on Tuesday, Thursday and Saturday.  Unfortunately she was not considered a candidate for renal transplant.  She has had issues with low blood pressure alternating with high blood pressure.  In fact she is on no hypertensive medications rather she has to take midodrine  on dialysis days.  This has been going on for some time.  She appears to be euvolemic and has had well-controlled volume status.  Her last echo several years ago showed normalization of LV function.  12/26/2023  Belinda [name] is seen today for an acute visit.  She is expressed some recent worsening shortness of breath.  In early June she had called in with worsening shortness of breath and productive cough.  She has been treated with steroids and antibiotics and does note some improvement although it is not totally resolved.  She still gets some short of breath when laying down and fatigue with exertion.  She was apparently seen  in urgent care and there was concern for recurrent heart failure although her last echo showed normal LVEF.  She presents today for further evaluation of this.  She denies any worsening swelling or edema.  Her weight is actually come down to 146 pounds from 155 pounds last year.  She is end-stage renal on dialysis and she is maintaining her dry weight pretty well.  There have not been any recent adjustments.  She is off of all of her heart failure medications because of issues with hypotension and requires midodrine  on dialysis days.  PMHx:  Past Medical History:  Diagnosis Date   Acute CHF (congestive heart failure) (HCC) 02/02/2018   Anemia    get monthly injections to boost my HgB (02/02/2018)   Anxiety    Asthma    Back pain    intractable low back   Chronic neck pain    CKD (chronic kidney disease), stage III (HCC)    on dialysis T/Th/Sa [facility]   Depression    Dysrhythmia    palpitations or heart racing periodic.  has had for years- Dr Dominick follows.   GERD (gastroesophageal reflux disease)    History of blood transfusion 2016   After knee replacemnet   Hx MRSA infection    Hyperlipidemia    Hypertension    Migraines    ~ 2/month (02/02/2018), gets headaches with dialysis   Nerve damage    right hand   Osteoarthritis    knees, neck (02/02/2018)   Palpitations    Pneumonia    twice in 2018 (02/02/2018)   Pulmonary artery hypertension (HCC)    Seasonal allergies    Type II diabetes mellitus (HCC)    insulin  dependent - fasting 140-200     Past Surgical History:  Procedure Laterality Date   A/V FISTULAGRAM N/A 05/30/2023   Procedure: A/V Fistulagram;  Surgeon: Melia Lynwood ORN, MD;  Location: [facility] INVASIVE CV LAB;  Service: Cardiovascular;  Laterality: N/A;   ABDOMINAL HYSTERECTOMY  03/2001   Abdominal supracervical hysterectomy, left salpingo-oophorectomy   ANTERIOR CERVICAL DECOMP/DISCECTOMY FUSION N/A 01/01/2014   Procedure: CERVICAL THREE-FOUR,CERVICAL FIVE-SIX,CERVICAL SIX-SEVEN ANTERIOR CERVICAL DECOMPRESSION/DISCECTOMY/FUSION;  Surgeon: Victory Gens, MD;  Location: [facility] NEURO ORS;  Service: Neurosurgery;  Laterality: N/A;  left-side approach   AV FISTULA PLACEMENT Right 02/13/2020   Procedure: ARTERIOVENOUS (AV) FISTULA CREATION;  Surgeon: Sheree Penne Bruckner, MD;  Location: [facility] OR;  Service: Vascular;  Laterality: Right;   AV FISTULA PLACEMENT Right 08/11/2020   Procedure: INSERTION OF RIGHT THIGH ARTERIOVENOUS (AV) GORE-TEX GRAFT;  Surgeon: Eliza Bruckner RAMAN, MD;  Location: [facility] OR;  Service: Vascular;  Laterality: Right;   BACK SURGERY     BASCILIC VEIN TRANSPOSITION Right 04/16/2020   Procedure: BASCILIC VEIN TRANSPOSITION SECOND STAGE RIGHT;  Surgeon: Sheree Penne Bruckner, MD;  Location: [facility] OR;  Service: Vascular;  Laterality: Right;   CATARACT EXTRACTION W/ INTRAOCULAR LENS  IMPLANT, BILATERAL Bilateral 2012   CESAREAN SECTION     COLONOSCOPY     COLONOSCOPY W/  POLYPECTOMY     DILATION AND CURETTAGE OF UTERUS  X 2   ESOPHAGOGASTRODUODENOSCOPY     HAND SURGERY Left 1970s   laceration repair 4th and 5th digits   KNEE ARTHROSCOPY Left    torn mensicus   LIGATION OF COMPETING BRANCHES OF ARTERIOVENOUS FISTULA Right 05/07/2020   Procedure: LIGATION OF RIGHT UPPER EXTREMITY FISTULA;  Surgeon: Sheree Penne Bruckner, MD;  Location: [facility] OR;  Service: Vascular;  Laterality: Right;   NASAL SINUS SURGERY  PERIPHERAL VASCULAR BALLOON ANGIOPLASTY  05/30/2023   Procedure: PERIPHERAL VASCULAR BALLOON ANGIOPLASTY;  Surgeon: Melia Lynwood ORN, MD;  Location: [facility] INVASIVE CV LAB;  Service: Cardiovascular;;   POLYPECTOMY     RIGHT HEART CATH N/A 06/23/2018   Procedure: RIGHT HEART CATH;  Surgeon: Cherrie Toribio SAUNDERS, MD;  Location: [facility] INVASIVE CV LAB;  Service: Cardiovascular;  Laterality: N/A;   TEE WITHOUT CARDIOVERSION N/A 01/22/2021   Procedure: TRANSESOPHAGEAL ECHOCARDIOGRAM (TEE);  Surgeon: Raford Riggs, MD;  Location: [facility] ENDOSCOPY;  Service: Cardiovascular;  Laterality: N/A;   TOTAL KNEE ARTHROPLASTY Left 02/12/2015   Procedure: TOTAL KNEE ARTHROPLASTY;  Surgeon: Toribio JULIANNA Chancy, MD;  Location: [facility] OR;  Service: Orthopedics;  Laterality: Left;   UPPER GASTROINTESTINAL ENDOSCOPY      FAMHx:  Family History  Problem Relation Age of Onset   Heart disease Mother    Alzheimer's disease Mother    Heart failure Father    Colon cancer Neg Hx    Stomach cancer Neg Hx    Esophageal cancer Neg Hx     SOCHx:   reports that she has never smoked. She has never used smokeless tobacco. She reports that she does not drink alcohol and does not use drugs.  ALLERGIES:  Allergies  Allergen Reactions   Doxycycline Other (See Comments)   Entresto  [Sacubitril -Valsartan ] Itching   Metformin Other (See Comments)    weakness   Omnicef [Cefdinir] Other (See Comments)    GI Upset   Aspirin  Other (See Comments)    GI issues, Can tolerate low aspirin    Augmentin  [Amoxicillin-Pot Clavulanate] Nausea And Vomiting and Other (See Comments)    GI issues DID THE REACTION INVOLVE: Swelling of the face/tongue/throat, SOB, or low BP? No Sudden or severe rash/hives, skin peeling, or the inside of the mouth or nose? No Did it require medical treatment? No When did it last happen?      long time  If all above answers are "NO", may proceed with cephalosporin use.    Erythromycin Nausea And Vomiting and Other (See Comments)    Gi issues   Gabapentin Swelling   Nickel Itching and Rash    Breakouts     ROS: Pertinent items noted in HPI and remainder of comprehensive ROS otherwise negative.  HOME MEDS: Current Outpatient Medications on File Prior to Visit  Medication Sig Dispense Refill   acetaminophen  (TYLENOL ) 650 MG CR tablet Take 1,300 mg by mouth every 8 (eight) hours as needed for pain (pain).     albuterol  (VENTOLIN  HFA) 108 (90 Base) MCG/ACT inhaler Inhale 2 puffs into the lungs as needed for wheezing or shortness of breath.     ALPRAZolam  (XANAX ) 0.5 MG tablet Take 0.25-0.5 mg by mouth 2 (two) times daily as needed for anxiety.     aspirin  EC 81 MG tablet Take 81 mg by mouth every Monday, Wednesday, and Friday. Swallow whole.     azelastine  (ASTELIN ) 0.1 % nasal spray Place 2 sprays into both nostrils daily as needed for allergies.     Continuous Glucose Sensor (FREESTYLE LIBRE 3 PLUS SENSOR) MISC Apply 1 sensor q 15 days as directed [id number] for 90 days     diclofenac  sodium (VOLTAREN ) 1 % GEL Apply 1 application topically 4 (four) times daily as needed (back and knee).     ezetimibe -simvastatin  (VYTORIN ) 10-40 MG per tablet Take 1 tablet by mouth at bedtime.      ferric citrate  (AURYXIA ) 1 GM 210 MG(Fe) tablet Take 210-420 mg by mouth  See admin instructions. Take 2 tablets (420 mg) by mouth 3 times daily with meals & take 1 tablet (210 mg) by mouth with each snack     fexofenadine (ALLEGRA) 180 MG tablet Take 180 mg by mouth daily.      Fluticasone -Umeclidin-Vilant (TRELEGY ELLIPTA) 100-62.5-25 MCG/ACT AEPB Inhale 1 puff into the lungs daily as needed (shortness of breath).     guaiFENesin  (MUCINEX ) 600 MG 12 hr tablet Take 600 mg by mouth in the morning and at bedtime.      insulin  glargine (LANTUS  SOLOSTAR) 100 UNIT/ML Solostar Pen Inject 24 Units into the skin at bedtime. (Patient taking differently: Inject 28 Units into the skin at bedtime.)     insulin  lispro (HUMALOG) 100 UNIT/ML injection Inject 4-10 Units into the skin 3 (three) times daily before meals. Sliding Scale     Magnesium  200 MG TABS Take 200 mg by mouth every other day. (Patient taking differently: Take 400 mg by mouth every other day.)     midodrine  (PROAMATINE ) 10 MG tablet Take 10 mg by mouth See admin instructions. Take 20 mg by mouth on Tuesday, Thursday and Saturday prior to dialysis, may take additional tablets as needed during dialysis.     multivitamin (RENA-VIT) TABS tablet Take 1 tablet by mouth daily with breakfast.      nitroGLYCERIN  (NITROSTAT ) 0.4 MG SL tablet Place 1 tablet (0.4 mg total) under the tongue every 5 (five) minutes x 3 doses as needed for chest pain. 6 tablet 1   olopatadine  (PATANOL) 0.1 % ophthalmic solution Place 1 drop into both eyes daily.     simethicone  (MYLICON) 125 MG chewable tablet Chew 125 mg by mouth daily as needed for flatulence (indigestion.).     VITAMIN D, CHOLECALCIFEROL, PO Take 1 tablet by mouth Every Tuesday,Thursday,and Saturday with dialysis.     furosemide  (LASIX ) 20 MG tablet Take 20 mg by mouth daily as needed for fluid or edema. (Patient not taking: Reported on 12/26/2023)     lidocaine -prilocaine  (EMLA ) cream Apply 1 Application topically every Tuesday, Thursday, and Saturday at 6 PM. (Patient not taking: Reported on 12/26/2023)     No current facility-administered medications on file prior to visit.    LABS/IMAGING: No results found for this or any previous visit (from the past 48 hours). No results  found.  LIPID PANEL: No results found for: CHOL, TRIG, HDL, CHOLHDL, VLDL, LDLCALC, LDLDIRECT   WEIGHTS: Wt Readings from Last 3 Encounters:  12/26/23 146 lb 3.2 oz (66.3 kg)  06/15/23 158 lb (71.7 kg)  04/22/23 155 lb 6.4 oz (70.5 kg)    VITALS: BP (!) 140/70 (BP Location: Left Arm, Patient Position: Sitting, Cuff Size: Normal)   Pulse 94   Ht 5' (1.524 m)   Wt 146 lb 3.2 oz (66.3 kg)   BMI 28.55 kg/m   EXAM: General appearance: alert and no distress Neck: no carotid bruit, no JVD and thyroid not enlarged, symmetric, no tenderness/mass/nodules Lungs: clear to auscultation bilaterally Heart: regular rate and rhythm Abdomen: soft, non-tender; bowel sounds normal; no masses,  no organomegaly Extremities: edema none Pulses: 2+ and symmetric Skin: Skin color, texture, turgor normal. No rashes or lesions Neurologic: Grossly normal Psych: Pleasant  EKG: EKG Interpretation Date/Time:  Monday December 26 2023 11:18:26 EDT Ventricular Rate:  102 PR Interval:  204 QRS Duration:  106 QT Interval:  374 QTC Calculation: 487 R Axis:   -44  Text Interpretation: Sinus tachycardia Left axis deviation Low voltage QRS Septal infarct (cited  on or before 02-Feb-2018) When compared with ECG of 22-Apr-2023 15:21, Criteria for Inferior infarct are no longer Present Confirmed by Mona Kent [phone]) on 12/26/2023 11:41:20 AM    ASSESSMENT: Dyspnea and fatigue Chronic systolic congestive heart failure, NYHA class II symptoms-LVEF 25-30%, now up to 60-65% (12/2020) ESRD on HD (right thigh fistula) Insulin -dependent diabetes - last A1c 9.6% Dyslipidemia Anemia Palpitations/PVCs  PLAN: 1.   Belinda [name] presents for an acute visit today about progressive dyspnea.  This was worse earlier in June however seem to improve with antibiotics and steroids.  I suspect is a pulmonary component although she has a history of heart failure.  Her last echo showed EF of 60 to 65% in 2022.  I  would like to update that echo.  No signs of heart failure today on exam, however congestion can be dealt with during dialysis so it is possible she has developed some LV dysfunction.  Diabetes is still uncontrolled with A1c about 9.6%.  This can also be associated with fatigue.  I will contact her with results of her echo and if possible and her blood pressure allows may need to restart some heart failure therapies.  Kent KYM Mona, MD, [facility], FNLA, FACP  [place]  [facility]  Medical Director of the Advanced Lipid Disorders &  Cardiovascular Risk Reduction Clinic Diplomate of the American Board of Clinical Lipidology Attending Cardiologist  Direct Dial: [phone]  Fax: [phone]  Website:  [email or web address] Kent JAYSON Mona 12/26/2023, 12:51 PM

## 2024-02-08 ENCOUNTER — Ambulatory Visit (HOSPITAL_COMMUNITY)
Admission: RE | Admit: 2024-02-08 | Discharge: 2024-02-08 | Disposition: A | Discharge status: Home / Self Care | Source: Ambulatory Visit | Attending: Cardiology | Admitting: Cardiology

## 2024-02-08 DIAGNOSIS — R0902 Hypoxemia: Secondary | ICD-10-CM | POA: Diagnosis present

## 2024-02-08 DIAGNOSIS — R0602 Shortness of breath: Secondary | ICD-10-CM | POA: Insufficient documentation

## 2024-02-08 LAB — ECHOCARDIOGRAM COMPLETE
Area-P 1/2: 3.38 cm2
MV VTI: 2.25 cm2
S' Lateral: 3.88 cm

## 2024-02-09 ENCOUNTER — Ambulatory Visit: Payer: Self-pay | Admitting: Internal Medicine

## 2024-02-17 MED ORDER — FUROSEMIDE 40 MG PO TABS: 40.0000 mg | ORAL_TABLET | Freq: Every day | ORAL | 3 refills | Status: DC

## 2024-03-26 ENCOUNTER — Encounter: Payer: Self-pay | Admitting: *Deleted

## 2024-03-28 ENCOUNTER — Ambulatory Visit: Attending: Internal Medicine | Admitting: Emergency Medicine

## 2024-03-28 ENCOUNTER — Encounter: Payer: Self-pay | Admitting: Emergency Medicine

## 2024-03-28 VITALS — BP 118/70 | HR 80 | Wt 142.0 lb

## 2024-03-28 DIAGNOSIS — I10 Essential (primary) hypertension: Secondary | ICD-10-CM

## 2024-03-28 DIAGNOSIS — I5022 Chronic systolic (congestive) heart failure: Secondary | ICD-10-CM

## 2024-03-28 DIAGNOSIS — I340 Nonrheumatic mitral (valve) insufficiency: Secondary | ICD-10-CM

## 2024-03-28 DIAGNOSIS — N186 End stage renal disease: Secondary | ICD-10-CM | POA: Diagnosis not present

## 2024-03-28 MED ORDER — FUROSEMIDE 40 MG PO TABS: 40.0000 mg | ORAL_TABLET | Freq: Every day | ORAL | 0 refills | Status: AC

## 2024-03-28 NOTE — Patient Instructions (Addendum)
 Medication Instructions:  NO CHANGES  Lab Work: NONE TO BE DONE TODAY.  Testing/Procedures: Your physician has requested that you have an echocardiogram. Echocardiography is a painless test that uses sound waves to create images of your heart. It provides your doctor with information about the size and shape of your heart and how well your heart's chambers and valves are working. This procedure takes approximately one hour. There are no restrictions for this procedure. Please do NOT wear cologne, perfume, aftershave, or lotions (deodorant is allowed). Please arrive 15 minutes prior to your appointment time.  Please note: We ask at that you not bring children with you during ultrasound (echo/ vascular) testing. Due to room size and safety concerns, children are not allowed in the ultrasound rooms during exams. Our front office staff cannot provide observation of children in our lobby area while testing is being conducted. An adult accompanying a patient to their appointment will only be allowed in the ultrasound room at the discretion of the ultrasound technician under special circumstances. We apologize for any inconvenience.   Follow-Up: At [facility], you and your health needs are our priority.  As part of our continuing mission to provide you with exceptional heart care, our providers are all part of one team.  This team includes your primary Cardiologist (physician) and Advanced Practice Providers or APPs (Physician Assistants and Nurse Practitioners) who all work together to provide you with the care you need, when you need it.  Your next appointment:   1-2 WEEKS POST ECHOCARDIOGRAM  Provider:   Vinie JAYSON Maxcy, MD

## 2024-03-28 NOTE — Progress Notes (Signed)
 Cardiology Office Note:    Date:  03/28/2024  ID:  SULEIKA DONAVAN, DOB [birth year], MRN [id number] PCP: Nichole Senior, MD  [facility] Providers Cardiologist:  Vinie JAYSON Maxcy, MD Advanced Heart Failure:  Toribio Fuel, MD       Patient Profile:       Chief Complaint: 70-month follow-up History of Present Illness:  Belinda [name] is a 73 y.o. female with visit-pertinent history of palpitations, PVCs, chronic systolic heart failure, ESRD on HD, insulin -dependent diabetes, dyslipidemia  Echocardiogram 04/2017 with LVEF 25 to 30%.  Admitted 8/19 with volume overload. AHF team consulted. Diuresed 40 lbs with lasix  drip and metolazone . Myeloma panel and PYP scan negative. Repeat echocardiogram in 01/2018 showed LVEF 45 to 50%.  She underwent MPI on 02/14/2018 with concern for mild to moderate reversibility involving the anterior wall.  Concern for mild reversibility in the inferior wall.  LVEF of 30%.  No LHC with elevated creatinine.  Right heart cath on 05/2018 showed mild to moderate PAH with normal PCWP and normal outputs.  Medical therapy was continued.  She started on hemodialysis on 11/01/2019.  Last seen by heart failure clinic on 10/15/2020.  Echocardiogram 09/2020 showed LVEF 60 to 65%, no RWMA, grade 1 DD.  With normalization of her EF she will graduate from the heart failure clinic and follow-up with Dr. Maxcy.  Echocardiogram 12/2020 showed LVEF 55%.  Patient was last seen in clinic on 12/26/2023 by Dr. Maxcy.  She was seen for an acute visit for progressive dyspnea.  Patient reported her symptoms have slightly improved on antibiotics and steroids.  Pulmonary component was suspected.  Echocardiogram was ordered and completed on 02/08/2024 showing LVEF of 35 to 40%, global hypokinesis, grade 2 DD, RV function and size normal, left atrial size mildly dilated, small pericardial effusion present, mild to moderate mitral valve regurgitation.  Her Lasix  was increased to 40 mg  daily.   Discussed the use of AI scribe software for clinical note transcription with the patient, who gave verbal consent to proceed.  History of Present Illness Belinda [name] is a 73 year old female with congestive heart failure and end-stage renal disease on dialysis who presents with worsening shortness of breath.  She experiences progressive shortness of breath since June, initially improving with antibiotics and steroids after a respiratory illness, but worsening again in the past couple of weeks, especially with exertion.  She began to take Lasix  daily approximately 2 months ago which did seem to help with her shortness of breath.  She undergoes dialysis three times a week on Tuesdays Thursdays and Saturdays. Her blood pressure tends to drop during dialysis, and she takes midodrine  during these sessions, experiencing jitteriness as a side effect.   Just recently during her dialysis sessions they have began pulling off more fluid which has drastically improved not only her shortness of breath but also her leg swelling.  Overall patient feels her dialysis center was not actively getting her back to her dry weight.  She is still able to complete her daily activities at home without significant symptoms.  She denies any chest pains, PND, palpitations, hematochezia, melena   Review of systems:  Please see the history of present illness. All other systems are reviewed and otherwise negative.      Studies Reviewed:        Echocardiogram 02/08/2024  1. Left ventricular ejection fraction, by estimation, is 35 to 40%. The  left ventricle has moderately decreased function. The left ventricle  demonstrates global hypokinesis. Left ventricular diastolic parameters are  consistent with Grade II diastolic  dysfunction (pseudonormalization). Elevated left atrial pressure.   2. Right ventricular systolic function is normal. The right ventricular  size is normal.   3. Left atrial size was  moderately dilated.   4. A small pericardial effusion is present.   5. The mitral valve is degenerative. Mild to moderate mitral valve  regurgitation. No evidence of mitral stenosis.   6. The aortic valve is tricuspid. Aortic valve regurgitation is not  visualized. Aortic valve sclerosis/calcification is present, without any  evidence of aortic stenosis.   7. The inferior vena cava is dilated in size with <50% respiratory  variability, suggesting right atrial pressure of 15 mmHg.   TEE 01/22/2021  1. Left ventricular ejection fraction, by estimation, is 60 to 65%. The  left ventricle has normal function. The left ventricle has no regional  wall motion abnormalities.   2. Right ventricular systolic function is normal. The right ventricular  size is normal.   3. No left atrial/left atrial appendage thrombus was detected.   4. The mitral valve is normal in structure. Trivial mitral valve  regurgitation. No evidence of mitral stenosis.   5. The aortic valve is tricuspid. Aortic valve regurgitation is trivial.  No aortic stenosis is present.   6. The inferior vena cava is normal in size with greater than 50%  respiratory variability, suggesting right atrial pressure of 3 mmHg.   Echocardiogram 01/19/2021  1. Left ventricular ejection fraction, by estimation, is 55%. The left  ventricle has normal function. The left ventricle has no regional wall  motion abnormalities. There is mild left ventricular hypertrophy. Left  ventricular diastolic parameters are  consistent with Grade I diastolic dysfunction (impaired relaxation).   2. Peak RV-RA gradient 22 mmHg. Right ventricular systolic function is  mildly reduced. The right ventricular size is normal.   3. The mitral valve is normal in structure. Trivial mitral valve  regurgitation. No evidence of mitral stenosis.   4. The aortic valve is tricuspid. Aortic valve regurgitation is not  visualized. No aortic stenosis is present.   5. The IVC  was not visualized.   6. Technically difficult study with poor acoustic windows.   Echocardiogram 10/15/2020  1. Left ventricular ejection fraction, by estimation, is 60 to 65%. The  left ventricle has normal function. The left ventricle has no regional  wall motion abnormalities. There is moderate left ventricular hypertrophy.  Left ventricular diastolic  parameters are consistent with Grade I diastolic dysfunction (impaired  relaxation).   2. Right ventricular systolic function is normal. The right ventricular  size is normal.   3. Left atrial size was moderately dilated.   4. Right atrial size was mildly dilated.   5. The mitral valve is normal in structure. Mild mitral valve  regurgitation. No evidence of mitral stenosis.   6. The aortic valve is normal in structure. There is mild calcification  of the aortic valve. Aortic valve regurgitation is trivial. No aortic  stenosis is present.   7. The inferior vena cava is dilated in size with >50% respiratory  variability, suggesting right atrial pressure of 8 mmHg.   Right heart catheterization 06/23/2018 Findings:   RA = 4 RV = 57/8 PA = 57/26 (40) PCW = 15 Fick cardiac output/index = 5.0/3.2 Thermo CO/CI = 6.22/3.9 PVR = 5.0 WU Ao sat = 96% PA sat = 69%, 71%   Assessment:   1. Mild to moderate PAH with  normal PCWP and normal outputs   Plan/Discussion:   Continue medical therapy and w/u for PAH. Volume status looks good.   Myoview  02/16/2018 1. Concern for mild to moderate reversibility involving the anterior wall. Concern for mild reversibility in the inferior wall.   2. Diffuse hypokinesia throughout the left ventricle.   3. Left ventricular ejection fraction is 30%.   4. Non invasive risk stratification*: High Risk Assessment/Calculations:              Physical Exam:   VS:  BP 118/70 (BP Location: Right Arm, Patient Position: Sitting, Cuff Size: Normal)   Pulse 80   Wt 142 lb (64.4 kg)   BMI 27.73 kg/m     Wt Readings from Last 3 Encounters:  03/28/24 142 lb (64.4 kg)  12/26/23 146 lb 3.2 oz (66.3 kg)  06/15/23 158 lb (71.7 kg)    GEN: Well nourished, well developed in no acute distress NECK: No JVD; No carotid bruits CARDIAC: RRR, no murmurs, rubs, gallops RESPIRATORY:  Clear to auscultation without rales, wheezing or rhonchi  ABDOMEN: Soft, non-tender, non-distended EXTREMITIES: Trace bilateral LE; No acute deformity      Assessment and Plan:  Chronic systolic heart failure Etiology unclear in the past - Myoview  01/2018 notable for mild to moderate anterior ischemia, cannot exclude breast attenuation.  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 Myeloma panel negative.  - Echo 04/2017 LVEF 25-30% - Echo 01/2018 LVEF 45-50% - Echo 04/2018 LVEF 25-30% - Echo 07/2018 LVEF 25-30% with global HK - RHC 12/19 with moderate PAH and normal PCWP - Echo 09/2020 LVEF 65-65% - Echo 12/2020 with LVEF 55% - Experienced acute shortness of breath 11/2023 - Most recent echo 01/2024 with LVEF 35-40%, subsequent started on Lasix  40 mg daily - Her volume status is now controlled by hemodialysis.  Has noted progressive shortness of breath over the past 4 months.  Within the past week during her dialysis sessions staff has began to pull off more fluid which has drastically improved her shortness of breath and LEE.  Patient reported she has not been getting to her dry weight over the past several months.  No chest pains reported.  States she does feel much improved - Unable to titrate GDMT given severe hypotension on dialysis.  She does take midodrine  during dialysis sessions.  Has experienced yeast infections on SGLT2i - I will have her take an extra 20 mg of Lasix  as needed on nondialysis days.  She should continue to work with her nephrologist on pulling off the appropriate amount of fluid in her dialysis sessions - Appears fairly euvolemic.  Lungs CTAB  and trace bilateral pedal edema - Now that she is on HD I considered cath to exclude critical CAD as cause of her cardiomyopathy. However deferred as she currently has no angina and is not likely a CABG candidate - Plan to repeat echocardiogram x 2 months to reassess her LVEF  Mitral valve regurgitation Echocardiogram 01/2024 showing mild to moderate mitral valve regurgitation - No indication for further intervention at this time - Will monitor with routine echocardiogram  Hypertension Blood pressure today is well-controlled 118/70 She experiences hypotension on dialysis days which is managed with midodrine .  Blood pressure well-controlled on nondialysis days without antihypertensives  ESRD On hemodialysis Tuesdays, Thursdays, Saturdays - Management per nephrology      Dispo:  Return in about 2 months (around 05/28/2024).  Signed, [employer]  LITTIE Louis, NP

## 2024-05-09 ENCOUNTER — Ambulatory Visit (HOSPITAL_COMMUNITY)
Admission: RE | Admit: 2024-05-09 | Discharge: 2024-05-09 | Disposition: A | Discharge status: Home / Self Care | Source: Ambulatory Visit | Attending: Internal Medicine | Admitting: Internal Medicine

## 2024-05-09 ENCOUNTER — Other Ambulatory Visit: Payer: Self-pay | Admitting: Emergency Medicine

## 2024-05-09 DIAGNOSIS — I5022 Chronic systolic (congestive) heart failure: Secondary | ICD-10-CM

## 2024-05-09 DIAGNOSIS — I10 Essential (primary) hypertension: Secondary | ICD-10-CM

## 2024-05-09 DIAGNOSIS — I340 Nonrheumatic mitral (valve) insufficiency: Secondary | ICD-10-CM

## 2024-05-09 DIAGNOSIS — N186 End stage renal disease: Secondary | ICD-10-CM

## 2024-05-09 LAB — ECHOCARDIOGRAM LIMITED
Area-P 1/2: 4.21 cm2
MV M vel: 5.38 m/s
MV Peak grad: 115.8 mmHg
Radius: 0.6 cm
S' Lateral: 4.4 cm

## 2024-05-15 ENCOUNTER — Encounter: Payer: Self-pay | Admitting: *Deleted

## 2024-05-16 ENCOUNTER — Ambulatory Visit: Admitting: Emergency Medicine

## 2024-05-16 NOTE — Progress Notes (Deleted)
 Cardiology Office Note:    Date:  05/16/2024  ID:  Belinda [name], DOB 1950/09/05, MRN [id number] PCP: Nichole Senior, MD  [facility] Providers Cardiologist:  Vinie JAYSON Maxcy, MD Advanced Heart Failure:  Toribio Fuel, MD { Click to update primary MD,subspecialty MD or APP then REFRESH:1}    {Click to Open Review  :1}   Patient Profile:       Chief Complaint: *** History of Present Illness:  Belinda [name] is a 73 y.o. female with visit-pertinent history of  palpitations, PVCs, chronic systolic heart failure, ESRD on HD, insulin -dependent diabetes, dyslipidemia   Echocardiogram 04/2017 with LVEF 25 to 30%.  Admitted 8/19 with volume overload. AHF team consulted. Diuresed 40 lbs with lasix  drip and metolazone . Myeloma panel and PYP scan negative. Repeat echocardiogram in 01/2018 showed LVEF 45 to 50%.  She underwent MPI on 02/14/2018 with concern for mild to moderate reversibility involving the anterior wall.  Concern for mild reversibility in the inferior wall.  LVEF of 30%.  No LHC with elevated creatinine.  Right heart cath on 05/2018 showed mild to moderate PAH with normal PCWP and normal outputs.  Medical therapy was continued.  She started on hemodialysis on 11/01/2019.   Last seen by heart failure clinic on 10/15/2020.  Echocardiogram 09/2020 showed LVEF 60 to 65%, no RWMA, grade 1 DD.  With normalization of her EF she will graduate from the heart failure clinic and follow-up with Dr. Maxcy.   Echocardiogram 12/2020 showed LVEF 55%.   Seen in clinic on 12/26/2023 by Dr. Maxcy.  She was seen for an acute visit for progressive dyspnea.  Patient reported her symptoms have slightly improved on antibiotics and steroids.  Pulmonary component was suspected.  Echocardiogram was ordered and completed on 02/08/2024 showing LVEF of 35 to 40%, global hypokinesis, grade 2 DD, RV function and size normal, left atrial size mildly dilated, small pericardial effusion present, mild to moderate  mitral valve regurgitation.  Her Lasix  was increased to 40 mg daily.  Last seen in clinic on 03/28/2024.  She reported her dialysis center had just recently started pulling off more fluid during her sessions with which drastically improved her shortness of breath and also her leg swelling.  She was unable to further titrate GDMT given severe hypotension on dialysis days for which she takes midodrine  during sessions.  She was to take an extra 20 mg of Lasix  as needed on nondialysis days.  She underwent echocardiogram on 05/09/2024 showing LVEF 35 to 40%, global hypokinesis, RV function normal, RV mildly enlarged, mildly elevated PASP, small pericardial effusion, mild mitral valve regurgitation.  No significant change from prior study.  Discussed the use of AI scribe software for clinical note transcription with the patient, who gave verbal consent to proceed.  History of Present Illness     Review of systems:  Please see the history of present illness. All other systems are reviewed and otherwise negative. ***      Studies Reviewed:        ***  Risk Assessment/Calculations:   {Does this patient have ATRIAL FIBRILLATION?[phone]} No BP recorded.  {Refresh Note OR Click here to enter BP  :1}***        Physical Exam:   VS:  There were no vitals taken for this visit.   Wt Readings from Last 3 Encounters:  03/28/24 142 lb (64.4 kg)  12/26/23 146 lb 3.2 oz (66.3 kg)  06/15/23 158 lb (71.7 kg)    GEN: Well  nourished, well developed in no acute distress NECK: No JVD; No carotid bruits CARDIAC: ***RRR, no murmurs, rubs, gallops RESPIRATORY:  Clear to auscultation without rales, wheezing or rhonchi  ABDOMEN: Soft, non-tender, non-distended EXTREMITIES:  No edema; No acute deformity ***      Assessment and Plan:    Assessment and Plan Assessment & Plan      {Are you ordering a CV Procedure (e.g. stress test, cath, DCCV, TEE, etc)?   Press F2        :[id number]}  Dispo:  No  follow-ups on file.  Signed, Lum LITTIE Louis, NP

## 2024-05-17 ENCOUNTER — Ambulatory Visit: Payer: Self-pay | Admitting: Emergency Medicine

## 2024-07-04 ENCOUNTER — Encounter: Payer: Self-pay | Admitting: Emergency Medicine

## 2024-07-04 ENCOUNTER — Ambulatory Visit: Admitting: Emergency Medicine

## 2024-07-04 ENCOUNTER — Encounter (HOSPITAL_COMMUNITY): Payer: Self-pay

## 2024-07-04 VITALS — BP 124/66 | HR 94 | Ht 60.0 in | Wt 128.0 lb

## 2024-07-04 DIAGNOSIS — I10 Essential (primary) hypertension: Secondary | ICD-10-CM

## 2024-07-04 DIAGNOSIS — I340 Nonrheumatic mitral (valve) insufficiency: Secondary | ICD-10-CM

## 2024-07-04 DIAGNOSIS — I5022 Chronic systolic (congestive) heart failure: Secondary | ICD-10-CM | POA: Diagnosis not present

## 2024-07-04 DIAGNOSIS — I351 Nonrheumatic aortic (valve) insufficiency: Secondary | ICD-10-CM

## 2024-07-04 DIAGNOSIS — N186 End stage renal disease: Secondary | ICD-10-CM

## 2024-07-04 NOTE — Progress Notes (Signed)
 " Cardiology Office Note:    Date:  07/04/2024  ID:  Belinda [name], DOB 1951-01-24, MRN [id number] PCP: Nichole Senior, MD  [facility] Providers Cardiologist:  Vinie JAYSON Maxcy, MD Advanced Heart Failure:  Toribio Fuel, MD       Patient Profile:       Chief Complaint: 70-month follow-up History of Present Illness:  Belinda [name] is a 74 y.o. female with visit-pertinent history of palpitations, PVCs, chronic systolic heart failure, ESRD on HD, insulin -dependent diabetes, dyslipidemia   Echocardiogram 04/2017 with LVEF 25 to 30%.  Admitted 8/19 with volume overload. AHF team consulted. Diuresed 40 lbs with lasix  drip and metolazone . Myeloma panel and PYP scan negative. Repeat echocardiogram in 01/2018 showed LVEF 45 to 50%.  She underwent MPI on 02/14/2018 with concern for mild to moderate reversibility involving the anterior wall.  Concern for mild reversibility in the inferior wall.  LVEF of 30%.  No LHC with elevated creatinine.  Right heart cath on 05/2018 showed mild to moderate PAH with normal PCWP and normal outputs.  Medical therapy was continued.  She started on hemodialysis on 11/01/2019.   Last seen by heart failure clinic on 10/15/2020.  Echocardiogram 09/2020 showed LVEF 60 to 65%, no RWMA, grade 1 DD.  With normalization of her EF she will graduate from the heart failure clinic and follow-up with Dr. Maxcy.   Echocardiogram 12/2020 showed LVEF 55%.   Seen in clinic on 12/26/2023 by Dr. Maxcy.  She was seen for an acute visit for progressive dyspnea.  Patient reported her symptoms have slightly improved on antibiotics and steroids.  Pulmonary component was suspected.  Echocardiogram was ordered and completed on 02/08/2024 showing LVEF of 35 to 40%, global hypokinesis, grade 2 DD, RV function and size normal, left atrial size mildly dilated, small pericardial effusion present, mild to moderate mitral valve regurgitation.  Her Lasix  was increased to 40 mg daily.  Last seen in  clinic on 03/28/2024.  Her volume status is now controlled by hemodialysis.  She had been experiencing some progressive shortness of breath for the past 4 months however within the past week her dialysis session staff had began to pull off more fluid which drastically improved her shortness of breath and lower extremity edema.  Unable to titrate GDMT given severe hypotension on dialysis.  She does take midodrine  during dialysis sessions.  She was to take an extra 20 mg of Lasix  as needed on nondialysis days.  Echocardiogram 05/09/2024 showed stable LVEF of 35 to 40%, LV has mildly decreased function, global hypokinesis, RV function normal in size mildly enlarged, mildly elevated PASP, left atrial size mildly dilated, right atrial size moderately dilated, small pericardial effusion, mild mitral valve regurgitation, mild aortic valve regurgitation.   Discussed the use of AI scribe software for clinical note transcription with the patient, who gave verbal consent to proceed.  History of Present Illness Belinda [name] is a 74 year old female with systolic heart failure on hemodialysis who presents for cardiology follow-up. She is accompanied by her friend, Vivian.  Today patient is doing well without acute cardiovascular concerns or complaints.  Currently receiving hemodialysis on Tuesdays Thursdays and Saturdays.  Her shortness of breath improved after increased fluid removal and a lower dry weight. She notes significant weight loss over the past year and poor appetite.  Today she is without chest pains or discomfort.  She denies worsening shortness of breath, orthopnea, or leg swelling.  She does take furosemide  daily and  does make urine.  She takes midodrine  during dialysis for hypotension and avoids it on non-dialysis days unless symptomatic, as more than one pill makes her feel jittery.  She previously tried Jardiance and Farxiga but stopped both due to severe yeast infections.   She is without  PND, palpitations, syncope, presyncope, lightheadedness, dizziness  Review of systems:  Please see the history of present illness. All other systems are reviewed and otherwise negative.      Studies Reviewed:    EKG Interpretation Date/Time:  Wednesday July 04 2024 15:27:47 EST Ventricular Rate:  94 PR Interval:  190 QRS Duration:  98 QT Interval:  422 QTC Calculation: 527 R Axis:   -46  Text Interpretation: Normal sinus rhythm Left axis deviation Anterior infarct (cited on or before 02-Feb-2018) When compared with ECG of 26-Dec-2023 11:18, No significant change was found Confirmed by Rana Dixon [phone]) on 07/04/2024 4:17:30 PM    Echocardiogram limited 05/09/2024 1. Left ventricular ejection fraction, by estimation, is 35 to 40%. Left  ventricular ejection fraction by 3D volume is 39 %. The left ventricle has  moderately decreased function. The left ventricle demonstrates global  hypokinesis. Left ventricular  diastolic function could not be evaluated.   2. Right ventricular systolic function is normal. The right ventricular  size is mildly enlarged. There is moderately elevated pulmonary artery  systolic pressure.   3. Left atrial size was mildly dilated.   4. Right atrial size was moderately dilated.   5. A small pericardial effusion is present.   6. The mitral valve is normal in structure. Mild mitral valve  regurgitation. No evidence of mitral stenosis.   7. The aortic valve is normal in structure. Aortic valve regurgitation is  mild. No aortic stenosis is present.   8. The inferior vena cava is dilated in size with >50% respiratory  variability, suggesting right atrial pressure of 8 mmHg.   Echocardiogram 05/10/2024  1. Left ventricular ejection fraction, by estimation, is 35 to 40%. The  left ventricle has moderately decreased function. The left ventricle  demonstrates global hypokinesis. Left ventricular diastolic parameters are  consistent with Grade II  diastolic  dysfunction (pseudonormalization). Elevated left atrial pressure.   2. Right ventricular systolic function is normal. The right ventricular  size is normal.   3. Left atrial size was moderately dilated.   4. A small pericardial effusion is present.   5. The mitral valve is degenerative. Mild to moderate mitral valve  regurgitation. No evidence of mitral stenosis.   6. The aortic valve is tricuspid. Aortic valve regurgitation is not  visualized. Aortic valve sclerosis/calcification is present, without any  evidence of aortic stenosis.   7. The inferior vena cava is dilated in size with <50% respiratory  variability, suggesting right atrial pressure of 15 mmHg.   TEE 01/22/2021  1. Left ventricular ejection fraction, by estimation, is 60 to 65%. The  left ventricle has normal function. The left ventricle has no regional  wall motion abnormalities.   2. Right ventricular systolic function is normal. The right ventricular  size is normal.   3. No left atrial/left atrial appendage thrombus was detected.   4. The mitral valve is normal in structure. Trivial mitral valve  regurgitation. No evidence of mitral stenosis.   5. The aortic valve is tricuspid. Aortic valve regurgitation is trivial.  No aortic stenosis is present.   6. The inferior vena cava is normal in size with greater than 50%  respiratory variability, suggesting right atrial pressure  of 3 mmHg.    Echocardiogram 01/19/2021  1. Left ventricular ejection fraction, by estimation, is 55%. The left  ventricle has normal function. The left ventricle has no regional wall  motion abnormalities. There is mild left ventricular hypertrophy. Left  ventricular diastolic parameters are  consistent with Grade I diastolic dysfunction (impaired relaxation).   2. Peak RV-RA gradient 22 mmHg. Right ventricular systolic function is  mildly reduced. The right ventricular size is normal.   3. The mitral valve is normal in structure.  Trivial mitral valve  regurgitation. No evidence of mitral stenosis.   4. The aortic valve is tricuspid. Aortic valve regurgitation is not  visualized. No aortic stenosis is present.   5. The IVC was not visualized.   6. Technically difficult study with poor acoustic windows.    Echocardiogram 10/15/2020  1. Left ventricular ejection fraction, by estimation, is 60 to 65%. The  left ventricle has normal function. The left ventricle has no regional  wall motion abnormalities. There is moderate left ventricular hypertrophy.  Left ventricular diastolic  parameters are consistent with Grade I diastolic dysfunction (impaired  relaxation).   2. Right ventricular systolic function is normal. The right ventricular  size is normal.   3. Left atrial size was moderately dilated.   4. Right atrial size was mildly dilated.   5. The mitral valve is normal in structure. Mild mitral valve  regurgitation. No evidence of mitral stenosis.   6. The aortic valve is normal in structure. There is mild calcification  of the aortic valve. Aortic valve regurgitation is trivial. No aortic  stenosis is present.   7. The inferior vena cava is dilated in size with >50% respiratory  variability, suggesting right atrial pressure of 8 mmHg.    Right heart catheterization 06/23/2018 Findings:   RA = 4 RV = 57/8 PA = 57/26 (40) PCW = 15 Fick cardiac output/index = 5.0/3.2 Thermo CO/CI = 6.22/3.9 PVR = 5.0 WU Ao sat = 96% PA sat = 69%, 71%   Assessment:   1. Mild to moderate PAH with normal PCWP and normal outputs   Plan/Discussion:   Continue medical therapy and w/u for PAH. Volume status looks good.    Myoview  02/16/2018 1. Concern for mild to moderate reversibility involving the anterior wall. Concern for mild reversibility in the inferior wall.   2. Diffuse hypokinesia throughout the left ventricle.   3. Left ventricular ejection fraction is 30%.   4. Non invasive risk stratification*: High Risk  Assessment/Calculations:              Physical Exam:   VS:  BP 124/66 (BP Location: Right Arm, Patient Position: Sitting, Cuff Size: Normal)   Pulse 94   Ht 5' (1.524 m)   Wt 128 lb (58.1 kg)   BMI 25.00 kg/m    Wt Readings from Last 3 Encounters:  07/04/24 128 lb (58.1 kg)  03/28/24 142 lb (64.4 kg)  12/26/23 146 lb 3.2 oz (66.3 kg)    GEN: Well nourished, well developed in no acute distress NECK: No JVD; No carotid bruits CARDIAC: RRR, no murmurs, rubs, gallops RESPIRATORY:  Clear to auscultation without rales, wheezing or rhonchi  ABDOMEN: Soft, non-tender, non-distended EXTREMITIES:  No edema; No acute deformity      Assessment and Plan:  Chronic systolic heart failure Etiology unclear in the past - Myoview  01/2018 notable for mild to moderate anterior ischemia, cannot exclude breast attenuation.  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 Myeloma panel negative.  - Echo 07/2018 LVEF 25-30% with global HK - Echo 12/2020 with LVEF 55% - Echo 01/2024 with LVEF 35 to 40% - Echo 04/2024 with LVEF 35 to 40% - Volume status is controlled on hemodialysis - Today patient appears euvolemic and well compensated on exam.  Lungs CTAB with no pedal edema - Reports shortness of breath has resolved after dialysis team adjusted her dry weight and removed more fluid.  No orthopnea, PND, LEE - Unable to initiate GDMT given severe hypotension on dialysis.  She does take midodrine  during dialysis sessions.   - Intolerant to SGLT2i due to yeast infection - Deferred further ischemic evaluation as she is currently without angina and not likely CABG candidate - No changes as she is stable, she will continue dialysis as scheduled   Mitral valve regurgitation Aortic valve regurgitation Echocardiogram 04/2024 showed mild mitral valve regurgitation and mild aortic valve regurgitation - No indication for further intervention at this time    Hypertension Blood pressure today is well-controlled 124/66 She experiences hypotension on dialysis days which is managed with midodrine .  Blood pressure well-controlled on nondialysis days without antihypertensives - No changes to current regimen    ESRD On hemodialysis Tuesdays, Thursdays, Saturdays - Management per nephrology      Dispo:  Return in about 4 months (around 11/01/2024).  Signed, Lum LITTIE Louis, NP  "

## 2024-07-04 NOTE — Patient Instructions (Signed)
 Medication Instructions:  NO CHANGES  Lab Work: NONE TO BE DONE TODAY.  Testing/Procedures: NONE  Follow-Up: At [facility], you and your health needs are our priority.  As part of our continuing mission to provide you with exceptional heart care, our providers are all part of one team.  This team includes your primary Cardiologist (physician) and Advanced Practice Providers or APPs (Physician Assistants and Nurse Practitioners) who all work together to provide you with the care you need, when you need it.  Your next appointment:   4 MONTHS   Provider:   Vinie JAYSON Maxcy, MD
